# Patient Record
Sex: Female | Born: 1965
Health system: Southern US, Community
[De-identification: ages and names within clinical notes are randomized; demographics above are authoritative.]

## PROBLEM LIST (undated history)

## (undated) ENCOUNTER — Emergency Department (HOSPITAL_COMMUNITY): Admission: EM | Discharge status: Absent without leave | Payer: Self-pay

## (undated) DIAGNOSIS — M549 Dorsalgia, unspecified: Secondary | ICD-10-CM

## (undated) DIAGNOSIS — K7460 Unspecified cirrhosis of liver: Secondary | ICD-10-CM

## (undated) DIAGNOSIS — D509 Iron deficiency anemia, unspecified: Secondary | ICD-10-CM

## (undated) DIAGNOSIS — R519 Headache, unspecified: Secondary | ICD-10-CM

## (undated) DIAGNOSIS — E538 Deficiency of other specified B group vitamins: Secondary | ICD-10-CM

## (undated) DIAGNOSIS — F419 Anxiety disorder, unspecified: Secondary | ICD-10-CM

## (undated) DIAGNOSIS — F909 Attention-deficit hyperactivity disorder, unspecified type: Secondary | ICD-10-CM

## (undated) DIAGNOSIS — R51 Headache: Secondary | ICD-10-CM

## (undated) DIAGNOSIS — I2510 Atherosclerotic heart disease of native coronary artery without angina pectoris: Secondary | ICD-10-CM

## (undated) DIAGNOSIS — I219 Acute myocardial infarction, unspecified: Secondary | ICD-10-CM

## (undated) DIAGNOSIS — K900 Celiac disease: Secondary | ICD-10-CM

## (undated) HISTORY — PX: KNEE CARTILAGE SURGERY: SHX688

## (undated) HISTORY — DX: Anxiety disorder, unspecified: F41.9

## (undated) HISTORY — DX: Deficiency of other specified B group vitamins: E53.8

## (undated) HISTORY — DX: Celiac disease: K90.0

## (undated) HISTORY — DX: Iron deficiency anemia, unspecified: D50.9

## (undated) HISTORY — DX: Attention-deficit hyperactivity disorder, unspecified type: F90.9

## (undated) HISTORY — PX: CHOLECYSTECTOMY: SHX55

## (undated) HISTORY — PX: TONSILLECTOMY: SUR1361

---

## 2005-06-26 ENCOUNTER — Encounter: Admission: RE | Admit: 2005-06-26 | Discharge: 2005-06-26 | Payer: Self-pay | Admitting: Oncology

## 2005-06-26 ENCOUNTER — Encounter (HOSPITAL_COMMUNITY): Admission: RE | Admit: 2005-06-26 | Discharge: 2005-07-26 | Payer: Self-pay | Admitting: Oncology

## 2005-06-26 ENCOUNTER — Ambulatory Visit (HOSPITAL_COMMUNITY): Payer: Self-pay | Admitting: Oncology

## 2005-07-31 ENCOUNTER — Encounter (HOSPITAL_COMMUNITY): Admission: RE | Admit: 2005-07-31 | Discharge: 2005-08-30 | Payer: Self-pay | Admitting: Oncology

## 2005-07-31 ENCOUNTER — Encounter: Admission: RE | Admit: 2005-07-31 | Discharge: 2005-07-31 | Payer: Self-pay | Admitting: Oncology

## 2005-08-28 ENCOUNTER — Ambulatory Visit (HOSPITAL_COMMUNITY): Payer: Self-pay | Admitting: Oncology

## 2006-01-22 ENCOUNTER — Ambulatory Visit (HOSPITAL_COMMUNITY): Payer: Self-pay | Admitting: Oncology

## 2006-01-22 ENCOUNTER — Encounter (HOSPITAL_COMMUNITY): Admission: RE | Admit: 2006-01-22 | Discharge: 2006-02-21 | Payer: Self-pay | Admitting: Oncology

## 2006-06-21 ENCOUNTER — Emergency Department (HOSPITAL_COMMUNITY): Admission: EM | Admit: 2006-06-21 | Discharge: 2006-06-21 | Payer: Self-pay | Admitting: Emergency Medicine

## 2006-10-20 ENCOUNTER — Ambulatory Visit (HOSPITAL_COMMUNITY): Payer: Self-pay | Admitting: Oncology

## 2006-10-20 ENCOUNTER — Encounter (HOSPITAL_COMMUNITY): Admission: RE | Admit: 2006-10-20 | Discharge: 2006-11-19 | Payer: Self-pay | Admitting: Oncology

## 2007-02-02 ENCOUNTER — Emergency Department (HOSPITAL_COMMUNITY): Admission: EM | Admit: 2007-02-02 | Discharge: 2007-02-02 | Payer: Self-pay | Admitting: Emergency Medicine

## 2007-04-24 ENCOUNTER — Encounter (HOSPITAL_COMMUNITY): Admission: RE | Admit: 2007-04-24 | Discharge: 2007-05-24 | Payer: Self-pay | Admitting: Oncology

## 2007-04-24 ENCOUNTER — Ambulatory Visit (HOSPITAL_COMMUNITY): Payer: Self-pay | Admitting: Oncology

## 2008-04-26 ENCOUNTER — Encounter (HOSPITAL_COMMUNITY): Admission: RE | Admit: 2008-04-26 | Discharge: 2008-05-26 | Payer: Self-pay | Admitting: Oncology

## 2008-04-26 ENCOUNTER — Ambulatory Visit (HOSPITAL_COMMUNITY): Payer: Self-pay | Admitting: Oncology

## 2008-07-11 ENCOUNTER — Other Ambulatory Visit: Admission: RE | Admit: 2008-07-11 | Discharge: 2008-07-11 | Payer: Self-pay | Admitting: Obstetrics & Gynecology

## 2008-07-20 ENCOUNTER — Ambulatory Visit (HOSPITAL_COMMUNITY): Payer: Self-pay | Admitting: Oncology

## 2008-07-20 ENCOUNTER — Encounter (HOSPITAL_COMMUNITY): Admission: RE | Admit: 2008-07-20 | Discharge: 2008-08-19 | Payer: Self-pay | Admitting: Oncology

## 2009-04-25 ENCOUNTER — Encounter (HOSPITAL_COMMUNITY): Admission: RE | Admit: 2009-04-25 | Discharge: 2009-05-25 | Payer: Self-pay | Admitting: Oncology

## 2009-04-25 ENCOUNTER — Ambulatory Visit (HOSPITAL_COMMUNITY): Payer: Self-pay | Admitting: Oncology

## 2009-10-27 ENCOUNTER — Encounter (HOSPITAL_COMMUNITY): Admission: RE | Admit: 2009-10-27 | Discharge: 2009-11-26 | Payer: Self-pay | Admitting: Oncology

## 2009-10-27 ENCOUNTER — Ambulatory Visit (HOSPITAL_COMMUNITY): Payer: Self-pay | Admitting: Oncology

## 2010-02-16 ENCOUNTER — Encounter (HOSPITAL_COMMUNITY)
Admission: RE | Admit: 2010-02-16 | Discharge: 2010-03-18 | Payer: Self-pay | Source: Home / Self Care | Attending: Oncology | Admitting: Oncology

## 2010-02-16 ENCOUNTER — Ambulatory Visit (HOSPITAL_COMMUNITY): Payer: Self-pay | Admitting: Oncology

## 2010-03-19 ENCOUNTER — Ambulatory Visit
Admission: RE | Admit: 2010-03-19 | Discharge: 2010-03-19 | Payer: Self-pay | Source: Home / Self Care | Attending: Internal Medicine | Admitting: Internal Medicine

## 2010-03-25 ENCOUNTER — Encounter (HOSPITAL_COMMUNITY): Payer: Self-pay | Admitting: Oncology

## 2010-03-29 ENCOUNTER — Ambulatory Visit: Admit: 2010-03-29 | Payer: Self-pay | Admitting: Internal Medicine

## 2010-03-29 ENCOUNTER — Ambulatory Visit (HOSPITAL_COMMUNITY)
Admission: RE | Admit: 2010-03-29 | Discharge: 2010-03-29 | Payer: Self-pay | Source: Home / Self Care | Attending: Internal Medicine | Admitting: Internal Medicine

## 2010-04-07 NOTE — Consult Note (Signed)
NAME:  Sharon Lawrence, Sharon Lawrence                    ACCOUNT NO.:  [id number]  MEDICAL RECORD NO.:  [id number]          PATIENT TYPE:  AMB  LOCATION:  DAY                           FACILITY:  [facility]  PHYSICIAN:  Hildred Laser, M.D.    DATE OF BIRTH:  03-09-1965  DATE OF CONSULTATION:  03/19/2010 DATE OF DISCHARGE:                                CONSULTATION   PRESENTING COMPLAINT:  Epigastric and left upper quadrant pain, bloating, intermittent nausea, vomiting and diarrhea.  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 Rory Percy for GI evaluation. She has history of celiac disease which was diagnosed back in September 2008, when she presented with iron deficiency anemia and diarrhea.  She had an EGD revealing small sliding hiatal hernia and abnormal duodenal mucosa and biopsy confirming celiac disease.  Her serology was also positive.  She had 10-mm polyp snared from her cecum which was acutely inflamed hyperplastic polyp and another polyp from the cecum turned out to be benign epithelium.  The patient was begun on gluten-free diet and she has done well.  She was diagnosed with B12 deficiency by Dr. Tressie Stalker and has been on replacement therapy.  Recent lab studies are reviewed below.  She states she had been doing well until 3 weeks ago when she started experiencing intermittent epigastric pain, bloating, nausea and sporadic vomiting.  Pain is described to be sharp and not crampy.  She states it is very hard for her to vomit, but when she does is generally fluid. She also complains of frequent diarrhea.  She may have several bowel movements on days when she has diarrhea and another days she passes formed stool.  At times, she feels as if she is constipated, but her stools have never been hard.  She denies melena or rectal bleeding.  She states her abdominal pain is not relieved with a bowel movement.  Her appetite is not good,  but she states she has gained 60 pounds in the last 6 months.  She rarely experiences heartburn.  She denies fever, chills or night sweats.  She states she is very diligent about her gluten-free diet.  She had unintentional exposure few months ago which she followed after she developed her typical symptoms.  She takes ibuprofen 4-800 mg p.r.n. only during her periods of if she has headache.  This is not very often maybe 5-6 doses a month.  She does not do regular exercise or walking.  CURRENT MEDICATIONS: 1. Vitamin B12 1 mg IM every month. 2. Calcium with D p.r.n. 3..  Ibuprofen 4-800 mg daily p.r.n.  PAST MEDICAL HISTORY:  She had tonsillectomy at age 65.  She had cholecystectomy for chronic cholecystitis in June 2006, Dr. Lindalou Hose. She had right knee arthroscopy for torn cartilage in 1985.  Celiac disease was diagnosed when she presented with iron deficiency anemia in September 2008.  She has had mild thrombocytosis felt to be due to her anemia.  Her last EGD and a colonoscopy, where in September 2008, as above.  ALLERGIES:  To  PENICILLIN, MYCIN, SULFA, TRAMADOL and DEMEROL all causing skin rash or throat swelling.  FAMILY HISTORY:  Father has fatty liver and history of colonic adenomas. Mother has history of breast carcinoma which she was treated at age 50 and remains in remission out 49.  Her BRCA-1 and BRCA-2 were negative. She also has history of colonic polyps.  She had 2 adenomas removed possibly a 7 years ago and she had a colonoscopy earlier this month with removal of a dozen adenomas and she still has some left.  Her genetic testing for FAP and [facility], The mutation is pending.  Sharon Lawrence has a sister age 64 who was diagnosed with rectal adenocarcinoma last year and she had chemoradiation followed by surgery and remains in remission.  SOCIAL HISTORY:  She is widowed.  Her second husband died of metastatic Zollinger-Ellison syndrome.  First marriage ended in divorce.  She has  3 children in good health.  She is on iron presently working at [employer].  She smoked cigarettes for 19 years, but quit 1 year ago and she does not drink alcohol.  OBJECTIVE:  VITAL SIGNS:  Weight 200.2 pounds, she is 60 inches tall, pulse 68 per minute, blood pressure 118/70, temp is 97.6. EYES:  Conjunctivae are pink.  Sclerae are Nonicteric. MOUTH:  Oropharyngeal mucosa is normal. NECK:  No neck masses or thyromegaly noted. CARDIAC:  With regular rhythm.  Normal S1 and S2.  No murmur or gallop noted. LUNGS:  Clear to auscultation. ABDOMEN:  Full.  Bowel sounds are normal on palpation, soft abdomen with mild tenderness in epigastric region, left upper quadrant as well as left lower quadrant.  No organomegaly or masses. EXTREMITIES:  She has 1+ pitting edema around her ankles.  No clubbing or koilonychia noted.  LABORATORY DATA:  From February 16, 2010, WBC 12.8, H and H 14 and 40.5, platelet count 490 K.  Her folate was greater than 20.  Serum ferritin was 152 and B12 level was 456.  ASSESSMENT:  Sharon Lawrence is a 45 year old Caucasian female with history of celiac disease who appears to be very compliant with gluten-free diet who presents with few week history of epigastric and left-sided abdominal pain as well as nausea, vomiting and bloating.  She is also having intermittent diarrhea.  Her family history is significant for rectal adenocarcinoma in her younger daughter who was diagnosed last year and remains in remission and her mother has been treated for breast carcinoma and recently had 12 adenomas and she still has some more to be removed.  Her genetic markers are pending.  Sharon Lawrence has 2 GI issues. 1. Acute symptoms with abdominal pain, intermittent nausea, vomiting     and diarrhea.  This maybe due to relapse of her celiac disease.     She maybe exposed to gluten unknowingly.  We also need to rule out     peptic ulcer disease. 2. Family history of colorectal carcinoma and multiple  colonic     adenomas.  Her last colonoscopy was over 3 years ago and she needs      to undergo one now. Depending on results of her mother's genetic test,     she may or may not be further studies.  RECOMMENDATIONS: 1. Dicyclomine 10 mg before each meal prescription given for 90 with 2     refills. 2. Diagnostic esophagogastroduodenoscopy and high-risk screening     colonoscopy.  I have reviewed both the procedure risks with the     patient and she is agreeable. 3.  We appreciate the opportunity to participate in the care of this     nice lady.     Hildred Laser, M.D.     NR/MEDQ  D:  03/20/2010  T:  03/20/2010  Job:  [id number]  cc:   Rory Percy Fax: [phone]  Gaston Islam. Tressie Stalker, MD Fax: [phone]  Electronically Signed by Hildred Laser M.D. on 04/07/2010 01:56:29 PM

## 2010-04-07 NOTE — Op Note (Signed)
NAME:  Sharon Lawrence, Sharon Lawrence                    ACCOUNT NO.:  [id number]  MEDICAL RECORD NO.:  [id number]          PATIENT TYPE:  AMB  LOCATION:  DAY                           FACILITY:  [facility]  PHYSICIAN:  Hildred Laser, M.D.    DATE OF BIRTH:  1965-06-24  DATE OF PROCEDURE:  03/29/2010 DATE OF DISCHARGE:                              OPERATIVE REPORT   PROCEDURE:  Esophagogastroduodenoscopy followed by colonoscopy.  INDICATION:  Sharon Lawrence is a 45 year old Caucasian female who was diagnosed with celiac disease over 3 years ago.  She states she has been very usual and about gluten-free diet, although she has had some unintentional exposure.  She has intermittent nausea, vomiting, epigastric pain and she also has left-sided abdominal pain and diarrhea. Family history is very significant the fact that her younger sister was diagnosed with rectal CA last year and mother just her colonoscopy few weeks ago with removal of over a dozen adenomas and  genetic tests are pending.  Sharon Lawrence's last colonoscopy was over 3 years ago.  Procedure risks were reviewed with the patient.  Informed consent was obtained.  MEDICATIONS FOR CONSCIOUS SEDATION:  Cetacaine spray for pharyngeal topical anesthesia, fentanyl 50 mcg IV and Versed 10 mg IV in divided dose.  FINDINGS:  Procedure performed in endoscopy suite.  The patient's vital signs and O2 sat were monitored during the procedure and remained stable.  PROCEDURE:  Esophagogastroduodenoscopy.  The patient was placed in left lateral recumbent position and Pentax videoscope was passed through oropharynx without any difficulty into esophagus.  Esophagus mucosa of the esophagus normal.  GE junction was 37 cm from the incisors and hiatus was at 37.  Stomach, it was emptied and distended very well with insufflation. Folds proximal stomach are normal.  Examination of mucosa at body, antrum, pyloric channel as well as angularis, fundus, and cardia was normal.  Duodenum,  there was some nodularity to bulbar mucosa.  Scope was passed in to second part of the duodenum where there was minimal scalloping but no erosions or ulcers noted.  Multiple biopsies were taken from the postbulbar mucosa.  Endoscope was withdrawn.  The patient prepared for procedure #2.  Colonoscopy:  Rectal examination was performed.  No abnormality noted on external or digital exam.  Pentax videoscope was placed through rectum and advanced under vision into sigmoid colon and beyond.  Preparation was satisfactory except she had thick coating of mucosa of the ascending colon and cecum.  Somewhat redundant colon, the scope was passed into cecum which was identified by ileocecal valve.  Blunt end of the cecum was easily reached, however, could not see mucosa a very well despite washing it.  As the scope was withdrawn, colonic mucosa was carefully examined.  There was a small polyp at distal transverse colon which was ablated via cold biopsy another one from rectosigmoid junction.  Rest of the mucosa was normal.  Rectal mucosa similarly was normal.  Scope was retroflexed to examine anorectal junction and small hemorrhoids noted below the dentate line and thickening of anoderm.  Endoscope was then withdrawn.  Withdrawal time was 9 minutes.  The patient tolerated the procedures well.  FINAL DIAGNOSES: 1. Abnormal appearance to postbulbar duodenal mucosa, possibly     indicative of ongoing activity secondary to celiac disease. 2. Colonoscopy performed to cecum with evaluation of right colon     limited because of quality of prep, prep distal to hepatic flexure     was satisfactory. 3. Two small polyps ablated via cold biopsy one from transverse colon     other one from rectosigmoid. 4. Small external hemorrhoids.  RECOMMENDATIONS: 1. The patient will continue her gluten-free diet with PPI and     dicyclomine. 2. I will be contacting the patient with results of biopsy and further      recommendations.     Hildred Laser, M.D.     NR/MEDQ  D:  03/29/2010  T:  03/29/2010  Job:  [id number]  cc:   Rory Percy Fax: [phone]  Gaston Islam. Tressie Stalker, MD Fax: [phone]  Electronically Signed by Hildred Laser M.D. on 04/07/2010 01:56:47 PM

## 2010-04-24 ENCOUNTER — Ambulatory Visit (HOSPITAL_COMMUNITY): Payer: Self-pay | Admitting: Oncology

## 2010-05-14 LAB — CBC
HCT: 40.5 % (ref 36.0–46.0)
Hemoglobin: 14 g/dL (ref 12.0–15.0)
MCH: 28.3 pg (ref 26.0–34.0)
MCHC: 34.6 g/dL (ref 30.0–36.0)
MCV: 81.8 fL (ref 78.0–100.0)
Platelets: 490 10*3/uL — ABNORMAL HIGH (ref 150–400)
RBC: 4.95 MIL/uL (ref 3.87–5.11)
RDW: 15.8 % — ABNORMAL HIGH (ref 11.5–15.5)
WBC: 12.8 10*3/uL — ABNORMAL HIGH (ref 4.0–10.5)

## 2010-05-14 LAB — FOLATE: Folate: >20 ng/mL

## 2010-05-14 LAB — FERRITIN: Ferritin: 152 ng/mL (ref 10–291)

## 2010-05-14 LAB — VITAMIN B12: Vitamin B-12: 456 pg/mL (ref 211–911)

## 2010-05-17 LAB — LACTATE DEHYDROGENASE: LDH: 147 U/L (ref 94–250)

## 2010-05-17 LAB — INTRINSIC FACTOR ANTIBODIES: Intrinsic Factor: NEGATIVE

## 2010-05-17 LAB — ANTI-PARIETAL ANTIBODY: Parietal Cell Antibody-IgG: <=20 U

## 2010-05-17 LAB — METHYLMALONIC ACID, SERUM: Methylmalonic Acid, Quantitative: 163 nmol/L (ref 87–318)

## 2010-05-18 LAB — COMPREHENSIVE METABOLIC PANEL
ALT: 34 U/L (ref 0–35)
AST: 30 U/L (ref 0–37)
Albumin: 3.5 g/dL (ref 3.5–5.2)
Alkaline Phosphatase: 75 U/L (ref 39–117)
BUN: 10 mg/dL (ref 6–23)
CO2: 27 mEq/L (ref 19–32)
Calcium: 8.9 mg/dL (ref 8.4–10.5)
Chloride: 107 mEq/L (ref 96–112)
Creatinine, Ser: 0.67 mg/dL (ref 0.4–1.2)
GFR calc Af Amer: >60 mL/min (ref >60)
GFR calc non Af Amer: >60 mL/min (ref >60)
Glucose, Bld: 87 mg/dL (ref 70–99)
Potassium: 4.5 mEq/L (ref 3.5–5.1)
Sodium: 139 mEq/L (ref 135–145)
Total Bilirubin: 0.3 mg/dL (ref 0.3–1.2)
Total Protein: 6.2 g/dL (ref 6.0–8.3)

## 2010-05-18 LAB — CBC
HCT: 40.8 % (ref 36.0–46.0)
Hemoglobin: 13.5 g/dL (ref 12.0–15.0)
MCH: 27.7 pg (ref 26.0–34.0)
MCHC: 33.1 g/dL (ref 30.0–36.0)
MCV: 83.7 fL (ref 78.0–100.0)
Platelets: 555 10*3/uL — ABNORMAL HIGH (ref 150–400)
RBC: 4.88 MIL/uL (ref 3.87–5.11)
RDW: 14.6 % (ref 11.5–15.5)
WBC: 11.2 10*3/uL — ABNORMAL HIGH (ref 4.0–10.5)

## 2010-05-18 LAB — IRON AND TIBC
Iron: 104 ug/dL (ref 42–135)
Saturation Ratios: 27 % (ref 20–55)
TIBC: 380 ug/dL (ref 250–470)
UIBC: 276 ug/dL

## 2010-05-18 LAB — VITAMIN B12: Vitamin B-12: 187 pg/mL — ABNORMAL LOW (ref 211–911)

## 2010-05-18 LAB — RETICULOCYTES
RBC.: 4.88 MIL/uL (ref 3.87–5.11)
Retic Count, Absolute: 63.4 10*3/uL (ref 19.0–186.0)
Retic Ct Pct: 1.3 % (ref 0.4–3.1)

## 2010-05-18 LAB — FERRITIN: Ferritin: 16 ng/mL (ref 10–291)

## 2010-05-18 LAB — FOLATE: Folate: 3.8 ng/mL

## 2010-05-23 LAB — VITAMIN B12: Vitamin B-12: 422 pg/mL (ref 211–911)

## 2010-05-23 LAB — DIFFERENTIAL
Basophils Absolute: 0.1 10*3/uL (ref 0.0–0.1)
Basophils Relative: 1 % (ref 0–1)
Eosinophils Absolute: 0.3 10*3/uL (ref 0.0–0.7)
Eosinophils Relative: 3 % (ref 0–5)
Lymphocytes Relative: 35 % (ref 12–46)
Lymphs Abs: 3.6 10*3/uL (ref 0.7–4.0)
Monocytes Absolute: 0.6 10*3/uL (ref 0.1–1.0)
Monocytes Relative: 6 % (ref 3–12)
Neutro Abs: 5.7 10*3/uL (ref 1.7–7.7)
Neutrophils Relative %: 55 % (ref 43–77)

## 2010-05-23 LAB — CBC
HCT: 40.9 % (ref 36.0–46.0)
Hemoglobin: 13.6 g/dL (ref 12.0–15.0)
MCHC: 33.3 g/dL (ref 30.0–36.0)
MCV: 84.7 fL (ref 78.0–100.0)
Platelets: 533 10*3/uL — ABNORMAL HIGH (ref 150–400)
RBC: 4.83 MIL/uL (ref 3.87–5.11)
RDW: 14.8 % (ref 11.5–15.5)
WBC: 10.3 10*3/uL (ref 4.0–10.5)

## 2010-05-23 LAB — VITAMIN D 1,25 DIHYDROXY
Vitamin D 1, 25 (OH)2 Total: 47 pg/mL (ref 18–72)
Vitamin D2 1, 25 (OH)2: <8 pg/mL
Vitamin D3 1, 25 (OH)2: 47 pg/mL

## 2010-05-23 LAB — FOLATE: Folate: 4.1 ng/mL

## 2010-05-23 LAB — FERRITIN: Ferritin: 37 ng/mL (ref 10–291)

## 2010-06-12 LAB — CBC
HCT: 41.9 % (ref 36.0–46.0)
Hemoglobin: 14.6 g/dL (ref 12.0–15.0)
MCHC: 35 g/dL (ref 30.0–36.0)
MCV: 84.7 fL (ref 78.0–100.0)
Platelets: 409 10*3/uL — ABNORMAL HIGH (ref 150–400)
RBC: 4.95 MIL/uL (ref 3.87–5.11)
RDW: 14.1 % (ref 11.5–15.5)
WBC: 13.9 10*3/uL — ABNORMAL HIGH (ref 4.0–10.5)

## 2010-06-12 LAB — FERRITIN: Ferritin: 43 ng/mL (ref 10–291)

## 2010-06-12 LAB — VITAMIN D 25 HYDROXY (VIT D DEFICIENCY, FRACTURES): Vit D, 25-Hydroxy: 37 ng/mL (ref 30–89)

## 2010-06-12 LAB — FOLATE: Folate: 11.1 ng/mL

## 2010-06-19 LAB — CBC
HCT: 44.5 % (ref 36.0–46.0)
Hemoglobin: 14.8 g/dL (ref 12.0–15.0)
MCHC: 33.1 g/dL (ref 30.0–36.0)
MCV: 85.5 fL (ref 78.0–100.0)
Platelets: 501 10*3/uL — ABNORMAL HIGH (ref 150–400)
RBC: 5.21 MIL/uL — ABNORMAL HIGH (ref 3.87–5.11)
RDW: 15.6 % — ABNORMAL HIGH (ref 11.5–15.5)
WBC: 10.7 10*3/uL — ABNORMAL HIGH (ref 4.0–10.5)

## 2010-06-19 LAB — FOLATE: Folate: 4.9 ng/mL

## 2010-06-19 LAB — FERRITIN: Ferritin: 37 ng/mL (ref 10–291)

## 2010-06-19 LAB — VITAMIN D 25 HYDROXY (VIT D DEFICIENCY, FRACTURES): Vit D, 25-Hydroxy: 18 ng/mL — ABNORMAL LOW (ref 30–89)

## 2010-09-03 ENCOUNTER — Ambulatory Visit (INDEPENDENT_AMBULATORY_CARE_PROVIDER_SITE_OTHER): Payer: 59 | Admitting: Internal Medicine

## 2010-11-23 LAB — FERRITIN: Ferritin: 58 (ref 10–291)

## 2010-11-23 LAB — CBC
HCT: 41
Hemoglobin: 13.8
MCHC: 33.6
MCV: 82.9
Platelets: 430 — ABNORMAL HIGH
RBC: 4.94
RDW: 15.9 — ABNORMAL HIGH
WBC: 11.1 — ABNORMAL HIGH

## 2010-11-23 LAB — FOLATE: Folate: >20

## 2010-11-23 LAB — GLIADIN ANTIBODIES, SERUM
Gliadin IgA: 36 U/mL — ABNORMAL HIGH (ref <7)
Gliadin IgG: 53 U/mL — ABNORMAL HIGH (ref <7)

## 2010-11-23 LAB — RETICULIN AB, IGA: Reticulin Antibody, IgA: POSITIVE

## 2010-11-23 LAB — TISSUE TRANSGLUTAMINASE, IGA: Tissue Transglutaminase Ab, IgA: >128 U/mL — ABNORMAL HIGH (ref <7)

## 2010-12-10 LAB — CBC
HCT: 50.2 — ABNORMAL HIGH
Hemoglobin: 16.6 — ABNORMAL HIGH
MCHC: 33
MCV: 79.7
Platelets: 582 — ABNORMAL HIGH
RBC: 6.3 — ABNORMAL HIGH
RDW: 18 — ABNORMAL HIGH
WBC: 12.8 — ABNORMAL HIGH

## 2010-12-10 LAB — DIFFERENTIAL
Basophils Absolute: 0
Basophils Relative: 0
Eosinophils Absolute: 0 — ABNORMAL LOW
Eosinophils Relative: 0
Lymphocytes Relative: 16
Lymphs Abs: 2
Monocytes Absolute: 0.3
Monocytes Relative: 2 — ABNORMAL LOW
Neutro Abs: 10.5 — ABNORMAL HIGH
Neutrophils Relative %: 82 — ABNORMAL HIGH

## 2010-12-10 LAB — COMPREHENSIVE METABOLIC PANEL
ALT: 39 — ABNORMAL HIGH
AST: 40 — ABNORMAL HIGH
Albumin: 4.4
Alkaline Phosphatase: 95
BUN: 27 — ABNORMAL HIGH
CO2: 17 — ABNORMAL LOW
Calcium: 8.6
Chloride: 103
Creatinine, Ser: 1.56 — ABNORMAL HIGH
GFR calc Af Amer: 45 — ABNORMAL LOW
GFR calc non Af Amer: 37 — ABNORMAL LOW
Glucose, Bld: 158 — ABNORMAL HIGH
Potassium: 4
Sodium: 135
Total Bilirubin: 0.5
Total Protein: 8.6 — ABNORMAL HIGH

## 2010-12-14 LAB — CBC
HCT: 37.4
Hemoglobin: 12.1
MCHC: 32.5
MCV: 75.7 — ABNORMAL LOW
Platelets: 514 — ABNORMAL HIGH
RBC: 4.94
RDW: 21.6 — ABNORMAL HIGH
WBC: 6.6

## 2010-12-14 LAB — FOLATE RBC: RBC Folate: 241

## 2010-12-14 LAB — VITAMIN B12: Vitamin B-12: 399 (ref 211–911)

## 2010-12-20 ENCOUNTER — Encounter (INDEPENDENT_AMBULATORY_CARE_PROVIDER_SITE_OTHER): Payer: Self-pay | Admitting: *Deleted

## 2011-02-07 ENCOUNTER — Encounter (HOSPITAL_COMMUNITY): Payer: Self-pay | Admitting: Oncology

## 2011-02-07 ENCOUNTER — Other Ambulatory Visit (HOSPITAL_COMMUNITY): Payer: Self-pay | Admitting: Oncology

## 2011-02-07 DIAGNOSIS — E611 Iron deficiency: Secondary | ICD-10-CM | POA: Insufficient documentation

## 2011-02-07 DIAGNOSIS — D509 Iron deficiency anemia, unspecified: Secondary | ICD-10-CM

## 2011-02-07 DIAGNOSIS — E538 Deficiency of other specified B group vitamins: Secondary | ICD-10-CM | POA: Insufficient documentation

## 2011-02-07 HISTORY — DX: Deficiency of other specified B group vitamins: E53.8

## 2011-02-07 HISTORY — DX: Iron deficiency anemia, unspecified: D50.9

## 2011-02-11 ENCOUNTER — Other Ambulatory Visit (HOSPITAL_COMMUNITY): Payer: 59

## 2011-02-14 ENCOUNTER — Other Ambulatory Visit (HOSPITAL_COMMUNITY): Payer: Self-pay | Admitting: Oncology

## 2011-02-14 ENCOUNTER — Encounter (HOSPITAL_COMMUNITY): Payer: 59 | Attending: Oncology

## 2011-02-14 DIAGNOSIS — E538 Deficiency of other specified B group vitamins: Secondary | ICD-10-CM | POA: Insufficient documentation

## 2011-02-14 LAB — COMPREHENSIVE METABOLIC PANEL
ALT: 25 U/L (ref 0–35)
AST: 20 U/L (ref 0–37)
Albumin: 3.4 g/dL — ABNORMAL LOW (ref 3.5–5.2)
Alkaline Phosphatase: 89 U/L (ref 39–117)
BUN: 9 mg/dL (ref 6–23)
CO2: 26 mEq/L (ref 19–32)
Calcium: 9.3 mg/dL (ref 8.4–10.5)
Chloride: 104 mEq/L (ref 96–112)
Creatinine, Ser: 0.65 mg/dL (ref 0.50–1.10)
GFR calc Af Amer: >90 mL/min (ref >90)
GFR calc non Af Amer: >90 mL/min (ref >90)
Glucose, Bld: 84 mg/dL (ref 70–99)
Potassium: 4.1 mEq/L (ref 3.5–5.1)
Sodium: 138 mEq/L (ref 135–145)
Total Bilirubin: 0.2 mg/dL — ABNORMAL LOW (ref 0.3–1.2)
Total Protein: 6.9 g/dL (ref 6.0–8.3)

## 2011-02-14 LAB — DIFFERENTIAL
Basophils Absolute: 0.1 10*3/uL (ref 0.0–0.1)
Basophils Relative: 1 % (ref 0–1)
Eosinophils Absolute: 0.1 10*3/uL (ref 0.0–0.7)
Eosinophils Relative: 1 % (ref 0–5)
Lymphocytes Relative: 31 % (ref 12–46)
Lymphs Abs: 3.4 10*3/uL (ref 0.7–4.0)
Monocytes Absolute: 0.8 10*3/uL (ref 0.1–1.0)
Monocytes Relative: 7 % (ref 3–12)
Neutro Abs: 6.7 10*3/uL (ref 1.7–7.7)
Neutrophils Relative %: 61 % (ref 43–77)

## 2011-02-14 LAB — IRON AND TIBC
Iron: 59 ug/dL (ref 42–135)
Saturation Ratios: 16 % — ABNORMAL LOW (ref 20–55)
TIBC: 375 ug/dL (ref 250–470)
UIBC: 316 ug/dL (ref 125–400)

## 2011-02-14 LAB — CBC
HCT: 42.7 % (ref 36.0–46.0)
Hemoglobin: 13.9 g/dL (ref 12.0–15.0)
MCH: 26.3 pg (ref 26.0–34.0)
MCHC: 32.6 g/dL (ref 30.0–36.0)
MCV: 80.7 fL (ref 78.0–100.0)
Platelets: 592 10*3/uL — ABNORMAL HIGH (ref 150–400)
RBC: 5.29 MIL/uL — ABNORMAL HIGH (ref 3.87–5.11)
RDW: 16.5 % — ABNORMAL HIGH (ref 11.5–15.5)
WBC: 11 10*3/uL — ABNORMAL HIGH (ref 4.0–10.5)

## 2011-02-14 LAB — FOLATE: Folate: 4 ng/mL

## 2011-02-14 LAB — FERRITIN: Ferritin: 21 ng/mL (ref 10–291)

## 2011-02-14 LAB — VITAMIN B12: Vitamin B-12: 318 pg/mL (ref 211–911)

## 2011-02-14 NOTE — Progress Notes (Signed)
Labs drawn today for cmp, vb12, folate,Iron and IBC, Ferr, cbc,diff

## 2011-02-15 ENCOUNTER — Other Ambulatory Visit (HOSPITAL_COMMUNITY): Payer: Self-pay | Admitting: Oncology

## 2011-02-15 ENCOUNTER — Telehealth (HOSPITAL_COMMUNITY): Payer: Self-pay

## 2011-02-15 NOTE — Telephone Encounter (Signed)
Unable to reach by phone.  Message left for patient to call back. 

## 2011-02-18 ENCOUNTER — Encounter (HOSPITAL_BASED_OUTPATIENT_CLINIC_OR_DEPARTMENT_OTHER): Payer: 59

## 2011-02-18 VITALS — BP 106/72 | HR 73 | Temp 98.3°F

## 2011-02-18 DIAGNOSIS — E611 Iron deficiency: Secondary | ICD-10-CM

## 2011-02-18 MED ORDER — SODIUM CHLORIDE 0.9 % IV SOLN
1020.0000 mg | Freq: Once | INTRAVENOUS | Status: AC
  Administered 2011-02-18: 1020 mg via INTRAVENOUS
  Filled 2011-02-18: qty 34

## 2011-02-18 MED ORDER — SODIUM CHLORIDE 0.9 % IJ SOLN
INTRAMUSCULAR | Status: AC
  Filled 2011-02-18: qty 10

## 2011-02-18 MED ORDER — SODIUM CHLORIDE 0.9 % IJ SOLN
10.0000 mL | INTRAMUSCULAR | Status: DC | PRN
  Administered 2011-02-18: 10 mL via INTRAVENOUS
  Filled 2011-02-18: qty 10

## 2011-03-26 ENCOUNTER — Ambulatory Visit (HOSPITAL_COMMUNITY): Payer: 59 | Admitting: Oncology

## 2012-05-21 ENCOUNTER — Telehealth (HOSPITAL_COMMUNITY): Payer: Self-pay | Admitting: *Deleted

## 2012-05-21 NOTE — Telephone Encounter (Signed)
Sharon Lawrence had to change her appt to see you to 4/3 and she wanted to know if you needed labs before you saw her or if she could do them the day of .Marland KitchenMarland KitchenMarland KitchenJust let me know which way and I will let her know if she needs to come in earlier.Marland KitchenMarland Kitchen

## 2012-05-25 ENCOUNTER — Ambulatory Visit (HOSPITAL_COMMUNITY): Payer: 59 | Admitting: Oncology

## 2012-06-01 ENCOUNTER — Other Ambulatory Visit (HOSPITAL_COMMUNITY): Payer: 59

## 2012-06-03 ENCOUNTER — Encounter (HOSPITAL_COMMUNITY): Payer: BC Managed Care – PPO | Attending: Oncology | Admitting: Oncology

## 2012-06-03 ENCOUNTER — Encounter (HOSPITAL_COMMUNITY): Payer: Self-pay | Admitting: Oncology

## 2012-06-03 VITALS — BP 137/77 | HR 81 | Temp 98.5°F | Resp 16 | Ht 60.0 in | Wt 202.4 lb

## 2012-06-03 DIAGNOSIS — K589 Irritable bowel syndrome without diarrhea: Secondary | ICD-10-CM | POA: Insufficient documentation

## 2012-06-03 DIAGNOSIS — N926 Irregular menstruation, unspecified: Secondary | ICD-10-CM

## 2012-06-03 DIAGNOSIS — E538 Deficiency of other specified B group vitamins: Secondary | ICD-10-CM | POA: Insufficient documentation

## 2012-06-03 DIAGNOSIS — D539 Nutritional anemia, unspecified: Secondary | ICD-10-CM

## 2012-06-03 DIAGNOSIS — R161 Splenomegaly, not elsewhere classified: Secondary | ICD-10-CM

## 2012-06-03 DIAGNOSIS — K900 Celiac disease: Secondary | ICD-10-CM | POA: Insufficient documentation

## 2012-06-03 DIAGNOSIS — E611 Iron deficiency: Secondary | ICD-10-CM

## 2012-06-03 DIAGNOSIS — D509 Iron deficiency anemia, unspecified: Secondary | ICD-10-CM | POA: Insufficient documentation

## 2012-06-03 DIAGNOSIS — R635 Abnormal weight gain: Secondary | ICD-10-CM

## 2012-06-03 DIAGNOSIS — K7689 Other specified diseases of liver: Secondary | ICD-10-CM | POA: Insufficient documentation

## 2012-06-03 DIAGNOSIS — R160 Hepatomegaly, not elsewhere classified: Secondary | ICD-10-CM

## 2012-06-03 LAB — COMPREHENSIVE METABOLIC PANEL
ALT: 28 U/L (ref 0–35)
AST: 24 U/L (ref 0–37)
Albumin: 3.5 g/dL (ref 3.5–5.2)
Alkaline Phosphatase: 96 U/L (ref 39–117)
BUN: 10 mg/dL (ref 6–23)
CO2: 25 mEq/L (ref 19–32)
Calcium: 9.3 mg/dL (ref 8.4–10.5)
Chloride: 105 mEq/L (ref 96–112)
Creatinine, Ser: 0.65 mg/dL (ref 0.50–1.10)
GFR calc Af Amer: >90 mL/min (ref >90)
GFR calc non Af Amer: >90 mL/min (ref >90)
Glucose, Bld: 111 mg/dL — ABNORMAL HIGH (ref 70–99)
Potassium: 4.4 mEq/L (ref 3.5–5.1)
Sodium: 138 mEq/L (ref 135–145)
Total Bilirubin: 0.2 mg/dL — ABNORMAL LOW (ref 0.3–1.2)
Total Protein: 6.7 g/dL (ref 6.0–8.3)

## 2012-06-03 LAB — CBC WITH DIFFERENTIAL/PLATELET
Basophils Absolute: 0 10*3/uL (ref 0.0–0.1)
Basophils Relative: 0 % (ref 0–1)
Eosinophils Absolute: 0 10*3/uL (ref 0.0–0.7)
Eosinophils Relative: 0 % (ref 0–5)
HCT: 42.3 % (ref 36.0–46.0)
Hemoglobin: 14.1 g/dL (ref 12.0–15.0)
Lymphocytes Relative: 40 % (ref 12–46)
Lymphs Abs: 3.6 10*3/uL (ref 0.7–4.0)
MCH: 26.7 pg (ref 26.0–34.0)
MCHC: 33.3 g/dL (ref 30.0–36.0)
MCV: 80.1 fL (ref 78.0–100.0)
Monocytes Absolute: 0.5 10*3/uL (ref 0.1–1.0)
Monocytes Relative: 5 % (ref 3–12)
Neutro Abs: 4.9 10*3/uL (ref 1.7–7.7)
Neutrophils Relative %: 55 % (ref 43–77)
Platelets: 557 10*3/uL — ABNORMAL HIGH (ref 150–400)
RBC: 5.28 MIL/uL — ABNORMAL HIGH (ref 3.87–5.11)
RDW: 16 % — ABNORMAL HIGH (ref 11.5–15.5)
WBC: 9 10*3/uL (ref 4.0–10.5)

## 2012-06-03 LAB — TSH: TSH: 1.745 u[IU]/mL (ref 0.350–4.500)

## 2012-06-03 LAB — IRON AND TIBC
Iron: 77 ug/dL (ref 42–135)
Saturation Ratios: 23 % (ref 20–55)
TIBC: 342 ug/dL (ref 250–470)
UIBC: 265 ug/dL (ref 125–400)

## 2012-06-03 LAB — FERRITIN: Ferritin: 24 ng/mL (ref 10–291)

## 2012-06-03 LAB — FOLATE: Folate: 5.7 ng/mL

## 2012-06-03 NOTE — Patient Instructions (Addendum)
.  [facility] Discharge Instructions  RECOMMENDATIONS MADE BY THE CONSULTANT AND ANY TEST RESULTS WILL BE SENT TO YOUR REFERRING PHYSICIAN.  EXAM FINDINGS BY THE PHYSICIAN TODAY AND SIGNS OR SYMPTOMS TO REPORT TO CLINIC OR PRIMARY PHYSICIAN:  We will do labs today Ct scans because liver and spleen are tender  INSTRUCTIONS GIVEN AND DISCUSSED: See [place] back in 2 weeks   Thank you for choosing [facility] to provide your oncology and hematology care.  To afford each patient quality time with our providers, please arrive at least 15 minutes before your scheduled appointment time.  With your help, our goal is to use those 15 minutes to complete the necessary work-up to ensure our physicians have the information they need to help with your evaluation and healthcare recommendations.    Effective January 1st, 2014, we ask that you re-schedule your appointment with our physicians should you arrive 10 or more minutes late for your appointment.  We strive to give you quality time with our providers, and arriving late affects you and other patients whose appointments are after yours.    Again, thank you for choosing [facility].  Our hope is that these requests will decrease the amount of time that you wait before being seen by our physicians.       _____________________________________________________________  Should you have questions after your visit to [facility], please contact our office at [phone] between the hours of 8:30 a.m. and 5:00 p.m.  Voicemails left after 4:30 p.m. will not be returned until the following business day.  For prescription refill requests, have your pharmacy contact our office with your prescription refill request.

## 2012-06-03 NOTE — Progress Notes (Signed)
Diagnosis #1 history of B12 deficiency #2 history of iron deficiency that was severe #3 history of folic acid deficiency #4 history of celiac disease but she still eats gluten occasionally #5 IBS #6 obesity weighing over 200 pounds on a 5 foot frame #7 hepatosplenomegaly with tenderness both over the spleen and liver #8 history of depression after the death of her husband 12 years ago  Very pleasant young lady 56 still having regular heavy menses. She promises that she takes her folic acid every day at her B12 shots monthly. She was told to take a PPI by Dr. Karilyn Cota approximately 2 years ago after her colonoscopy and EGD as well as dicyclomine but she never got either one. She has not been feeling well. She does have much energy. She doesn't get any regular exercise at all. She is able to work and she comes home and feels exhausted. She still has 3 children basically at home. One is to graduate from high school this year the other is 45 years old and she still has a 45 year old.  She is not actively dating. She doesn't appear depressed doesn't admit to that.  She still has explosive diarrhea times. She states that is why she doesn't like eats salads but she has explosive diarrhea even without eating salads. This is most likely related to the IBS.   BP 137/77  Pulse 81  Temp(Src) 98.5 F (36.9 C) (Oral)  Resp 16  Ht 5' (1.524 m)  Wt 202 lb 6.4 oz (91.808 kg)  BMI 39.53 kg/m2  She is more overweight and I ever remember. She is in no acute distress. She does not have obvious thyromegaly to me. She has no lymphadenopathy in any location. Her lungs are clear but she does tell me she is now smoking 5 cigarettes a day and has done so since her husband died. Heart shows a regular rhythm and rate. Breast exam shows no distinct masses but she is fibroglandular changes especially in the outer quadrants. Abdomen shows a liver edge easily palpable 5-6 centers below the costal margin the right in its  tender and her spleen tip is palpable in the left upper quadrant it is also tender. Spleen tip extends partially 3 cm below the costal margin on the left. Her abdomen is obese. Bowel sounds are normal to hyperactive. She has no peripheral edema of the arms. His puffiness of both legs and there is a sense of trace edema.  She is alert and oriented.  She needs blood work and she is a CAT scan of her abdomen and pelvis and I need to discuss her case with Dr. Karilyn Cota. She promises to come back in 2 weeks

## 2012-06-05 LAB — METHYLMALONIC ACID, SERUM: Methylmalonic Acid, Quantitative: 0.25 umol/L (ref <0.40)

## 2012-06-10 ENCOUNTER — Ambulatory Visit (HOSPITAL_COMMUNITY)
Admission: RE | Admit: 2012-06-10 | Discharge: 2012-06-10 | Disposition: A | Discharge status: Home / Self Care | Payer: BC Managed Care – PPO | Source: Ambulatory Visit | Attending: Oncology | Admitting: Oncology

## 2012-06-10 DIAGNOSIS — E611 Iron deficiency: Secondary | ICD-10-CM

## 2012-06-10 DIAGNOSIS — D539 Nutritional anemia, unspecified: Secondary | ICD-10-CM

## 2012-06-10 DIAGNOSIS — R1011 Right upper quadrant pain: Secondary | ICD-10-CM | POA: Insufficient documentation

## 2012-06-10 DIAGNOSIS — R635 Abnormal weight gain: Secondary | ICD-10-CM | POA: Insufficient documentation

## 2012-06-10 DIAGNOSIS — R160 Hepatomegaly, not elsewhere classified: Secondary | ICD-10-CM | POA: Insufficient documentation

## 2012-06-10 DIAGNOSIS — E538 Deficiency of other specified B group vitamins: Secondary | ICD-10-CM

## 2012-06-10 MED ORDER — IOHEXOL 300 MG/ML  SOLN
100.0000 mL | Freq: Once | INTRAMUSCULAR | Status: AC | PRN
  Administered 2012-06-10: 100 mL via INTRAVENOUS

## 2012-06-17 ENCOUNTER — Encounter (HOSPITAL_BASED_OUTPATIENT_CLINIC_OR_DEPARTMENT_OTHER): Payer: BC Managed Care – PPO | Admitting: Oncology

## 2012-06-17 ENCOUNTER — Encounter (HOSPITAL_COMMUNITY): Payer: Self-pay | Admitting: Oncology

## 2012-06-17 VITALS — BP 122/73 | HR 100 | Temp 98.6°F | Resp 18 | Wt 204.8 lb

## 2012-06-17 DIAGNOSIS — R591 Generalized enlarged lymph nodes: Secondary | ICD-10-CM

## 2012-06-17 DIAGNOSIS — R599 Enlarged lymph nodes, unspecified: Secondary | ICD-10-CM

## 2012-06-17 DIAGNOSIS — D509 Iron deficiency anemia, unspecified: Secondary | ICD-10-CM

## 2012-06-17 DIAGNOSIS — E611 Iron deficiency: Secondary | ICD-10-CM

## 2012-06-17 DIAGNOSIS — K7689 Other specified diseases of liver: Secondary | ICD-10-CM

## 2012-06-17 DIAGNOSIS — E538 Deficiency of other specified B group vitamins: Secondary | ICD-10-CM

## 2012-06-17 MED ORDER — FOLIC ACID 1 MG PO TABS: 1.0000 mg | ORAL_TABLET | Freq: Two times a day (BID) | ORAL | Status: DC

## 2012-06-17 NOTE — Patient Instructions (Addendum)
.  [facility] Discharge Instructions  RECOMMENDATIONS MADE BY THE CONSULTANT AND ANY TEST RESULTS WILL BE SENT TO YOUR REFERRING PHYSICIAN.  EXAM FINDINGS BY THE PHYSICIAN TODAY AND SIGNS OR SYMPTOMS TO REPORT TO CLINIC OR PRIMARY PHYSICIAN: Stick to Gluten free diet  MEDICATIONS PRESCRIBED:  Continue b 12 Folic acid twice a day  INSTRUCTIONS GIVEN AND DISCUSSED: Iron infusion in May Labs and Ct scans in August Then to see Dr. Mariel Sleet and Karilyn Cota after Scans  SPECIAL INSTRUCTIONS/FOLLOW-UP:   Thank you for choosing [facility] to provide your oncology and hematology care.  To afford each patient quality time with our providers, please arrive at least 15 minutes before your scheduled appointment time.  With your help, our goal is to use those 15 minutes to complete the necessary work-up to ensure our physicians have the information they need to help with your evaluation and healthcare recommendations.    Effective January 1st, 2014, we ask that you re-schedule your appointment with our physicians should you arrive 10 or more minutes late for your appointment.  We strive to give you quality time with our providers, and arriving late affects you and other patients whose appointments are after yours.    Again, thank you for choosing [facility].  Our hope is that these requests will decrease the amount of time that you wait before being seen by our physicians.       _____________________________________________________________  Should you have questions after your visit to [facility], please contact our office at [phone] between the hours of 8:30 a.m. and 5:00 p.m.  Voicemails left after 4:30 p.m. will not be returned until the following business day.  For prescription refill requests, have your pharmacy contact our office with your prescription refill request.

## 2012-06-17 NOTE — Progress Notes (Signed)
#  1 B12 deficiency on replacement B12 monthly with a normal methylmalonic acid level #2 iron deficiency, severe, secondary to inadequate absorption from the GI tract secondary to celiac disease. Her iron studies now are borderline and rather than wait until she is severely deficient we will preemptively give her IV iron in May 2014 #3 folic acid deficiency also with a borderline low normal value and I will increase her dose to 1 mg twice a day #4 celiac disease #5 prominent lymph nodes within the abdominal mesentery and Dr. Karilyn Cota and I feel that she is a repeat CAT scan in 4 months which we will schedule in August 2014 #6 IBS #7 fatty infiltration of her liver, by CAT scan criteria she does not have enlargement of her liver or spleen but she has had a liver biopsy the past showing fatty liver  She is here today to go over all the results her laboratory work which we have done. She does need increase her folic acid. She does need a repeat IV iron infusion. We will schedule that for May of this year.  We will then see her back in August after her CAT scans for reevaluation upper lymph nodes I want her to see Dr. Karilyn Cota and make sure she is doing well from the select disease picture. She really is noncompliant with her diet which I think would make all the difference in the were with her weight and her sense of well-being.  With the fatty infiltration of the liver she clearly needs weight reduction to reduce her risk of cirrhosis and eventually hepatocellular carcinoma.  I counseled her today extensively about the need for the above issues.

## 2012-06-24 NOTE — Progress Notes (Signed)
Will schedule office visit.

## 2012-06-24 NOTE — Progress Notes (Signed)
Apt has been scheduled for 07/01/12 with Dorene Ar,  NP.

## 2012-07-02 ENCOUNTER — Ambulatory Visit (INDEPENDENT_AMBULATORY_CARE_PROVIDER_SITE_OTHER): Payer: BC Managed Care – PPO | Admitting: Internal Medicine

## 2012-07-08 ENCOUNTER — Encounter (INDEPENDENT_AMBULATORY_CARE_PROVIDER_SITE_OTHER): Payer: Self-pay | Admitting: *Deleted

## 2012-07-08 ENCOUNTER — Encounter (INDEPENDENT_AMBULATORY_CARE_PROVIDER_SITE_OTHER): Payer: Self-pay | Admitting: Internal Medicine

## 2012-07-08 ENCOUNTER — Encounter (HOSPITAL_COMMUNITY): Payer: BC Managed Care – PPO | Attending: Oncology

## 2012-07-08 ENCOUNTER — Ambulatory Visit (INDEPENDENT_AMBULATORY_CARE_PROVIDER_SITE_OTHER): Payer: BC Managed Care – PPO | Admitting: Internal Medicine

## 2012-07-08 VITALS — BP 105/71 | HR 93 | Temp 97.9°F | Resp 18

## 2012-07-08 VITALS — BP 110/80 | HR 80 | Ht 60.0 in | Wt 199.9 lb

## 2012-07-08 DIAGNOSIS — D509 Iron deficiency anemia, unspecified: Secondary | ICD-10-CM

## 2012-07-08 DIAGNOSIS — K760 Fatty (change of) liver, not elsewhere classified: Secondary | ICD-10-CM | POA: Insufficient documentation

## 2012-07-08 DIAGNOSIS — E611 Iron deficiency: Secondary | ICD-10-CM

## 2012-07-08 DIAGNOSIS — K7689 Other specified diseases of liver: Secondary | ICD-10-CM

## 2012-07-08 MED ORDER — SODIUM CHLORIDE 0.9 % IJ SOLN
10.0000 mL | INTRAMUSCULAR | Status: DC | PRN
  Administered 2012-07-08: 10 mL
  Filled 2012-07-08: qty 10

## 2012-07-08 MED ORDER — SODIUM CHLORIDE 0.9 % IV SOLN
Freq: Once | INTRAVENOUS | Status: AC
  Administered 2012-07-08: 14:00:00 via INTRAVENOUS

## 2012-07-08 MED ORDER — SODIUM CHLORIDE 0.9 % IV SOLN
1020.0000 mg | Freq: Once | INTRAVENOUS | Status: AC
  Administered 2012-07-08: 1020 mg via INTRAVENOUS
  Filled 2012-07-08: qty 34

## 2012-07-08 NOTE — Patient Instructions (Addendum)
cmet in 6 months. OV in 6 month. Continue to diet and exercise

## 2012-07-08 NOTE — Progress Notes (Signed)
Subjective:     Patient ID: Sharon Lawrence, female   DOB: [birth year], 47 y.o.   MRN: [id number]  HPI Here today for f/u of fatty liver. Diagnosed in 2006 on biopsy by Dr. Lindalou Hose when she underwent a cholecystectomy. She is exercising. She walks at least 3 x a week. She walks x 15 minutes when she walks. In 2012 she weighed 200, today she weighs 199.9 Appetite is good. She is on a Celiac diet and admits she does chest at times. No abdominal pain. She does occasionally have left upper quadrant pain with nausea and vomiting. She then had diarrhea. Symptoms lasted about 8 hrs and then resolved.  She sees Dr. Tressie Stalker for iron deficiency and receives Iron transfusions at once a year. She just received an iron transfusion today.  06/03/2012 CT abdomen/pelvis: IMPRESSION:  1. Liver and spleen are normal volume.  2. No abdominal or pelvic lymphadenopathy.  3. Bilateral benign adrenal adenomas.   Liver biopsy during cholecystectomy in 2006: Hepatic steatosis. Predominantly macrovesicular, occupying approximately 20% of the tissue.   06/03/12 ferritin 24, Iron 77.  CMP     Component Value Date/Time   NA 138 06/03/2012 1156   K 4.4 06/03/2012 1156   CL 105 06/03/2012 1156   CO2 25 06/03/2012 1156   GLUCOSE 111* 06/03/2012 1156   BUN 10 06/03/2012 1156   CREATININE 0.65 06/03/2012 1156   CALCIUM 9.3 06/03/2012 1156   PROT 6.7 06/03/2012 1156   ALBUMIN 3.5 06/03/2012 1156   AST 24 06/03/2012 1156   ALT 28 06/03/2012 1156   ALKPHOS 96 06/03/2012 1156   BILITOT 0.2* 06/03/2012 1156   GFRNONAA >90 06/03/2012 1156   GFRAA >90 06/03/2012 1156   CBC    Component Value Date/Time   WBC 9.0 06/03/2012 1156   RBC 5.28* 06/03/2012 1156   HGB 14.1 06/03/2012 1156   HCT 42.3 06/03/2012 1156   PLT 557* 06/03/2012 1156   MCV 80.1 06/03/2012 1156   MCH 26.7 06/03/2012 1156   MCHC 33.3 06/03/2012 1156   RDW 16.0* 06/03/2012 1156   LYMPHSABS 3.6 06/03/2012 1156   MONOABS 0.5 06/03/2012 1156   EOSABS 0.0 06/03/2012 1156   BASOSABS 0.0 06/03/2012 1156        Review of Systems see hpi Current Outpatient Prescriptions  Medication Sig Dispense Refill  . Calcium Carbonate-Vitamin D 600-400 MG-UNIT per tablet Take 1 tablet by mouth daily.      . cyanocobalamin (,VITAMIN B-12,) 1000 MCG/ML injection Inject 1,000 mcg into the muscle every 30 (thirty) days.      . diphenhydrAMINE (BENADRYL) 25 mg capsule Take 25 mg by mouth at bedtime as needed for itching.      . folic acid (FOLVITE) 1 MG tablet Take 1 tablet (1 mg total) by mouth 2 (two) times daily.  100 tablet  3  . ibuprofen (ADVIL,MOTRIN) 800 MG tablet Take 800 mg by mouth daily.      . Multiple Vitamin (MULTIVITAMIN) tablet Take 1 tablet by mouth daily.       No current facility-administered medications for this visit.   Facility-Administered Medications Ordered in Other Visits  Medication Dose Route Frequency Provider Last Rate Last Dose  . sodium chloride 0.9 % injection 10 mL  10 mL Intracatheter PRN Pieter Partridge, MD   10 mL at 07/08/12 1410   Past Medical History  Diagnosis Date  . Folic acid deficiency 54/04/7060  . Iron deficiency anemia 02/07/2011  . B12 deficiency 02/07/2011  .  Celiac disease    Past Surgical History  Procedure Laterality Date  . Tonsillectomy    . Cholecystectomy    . Knee cartilage surgery Right    Allergies  Allergen Reactions  . Erythromycin Anaphylaxis  . Neosporin Original (Bacitracin-Neomycin-Polymyxin)     Swelling at site of application  . Sulfa Antibiotics Swelling    Sulfa eye drops caused eyes to swell  . Penicillins Rash        Objective:   Physical Exam  Filed Vitals:   07/08/12 1517  BP: 110/80  Pulse: 80  Height: 5' (1.524 m)  Weight: 199 lb 14.4 oz (90.674 kg)   Alert and oriented. Skin warm and dry. Oral mucosa is moist.   . Sclera anicteric, conjunctivae is pink. Thyroid not enlarged. No cervical lymphadenopathy. Lungs clear. Heart regular rate and rhythm.  Abdomen is soft. Bowel sounds are positive. No  hepatomegaly. Liver felt possibly 2 fingerbreadth's below sternum.  No abdominal masses felt. No tenderness.  No edema to lower extremities.       Assessment:    Fatty liver. Her liver did not feel enlarged today. Her transaminases are normal.     Plan:     OV in 6 months with a CMET. She was encouraged to diet and exercise.

## 2012-07-08 NOTE — Progress Notes (Signed)
Arrived today for iron infusion. Tolerated well.

## 2012-07-09 ENCOUNTER — Telehealth (INDEPENDENT_AMBULATORY_CARE_PROVIDER_SITE_OTHER): Payer: Self-pay | Admitting: *Deleted

## 2012-07-09 DIAGNOSIS — K760 Fatty (change of) liver, not elsewhere classified: Secondary | ICD-10-CM

## 2012-07-09 NOTE — Telephone Encounter (Signed)
.  Per Terri Setzer,NP 

## 2012-10-07 ENCOUNTER — Other Ambulatory Visit (HOSPITAL_COMMUNITY): Payer: BC Managed Care – PPO

## 2012-10-09 ENCOUNTER — Ambulatory Visit (HOSPITAL_COMMUNITY): Payer: BC Managed Care – PPO

## 2012-10-16 ENCOUNTER — Encounter (HOSPITAL_COMMUNITY): Payer: Self-pay

## 2012-12-24 ENCOUNTER — Encounter (INDEPENDENT_AMBULATORY_CARE_PROVIDER_SITE_OTHER): Payer: Self-pay | Admitting: *Deleted

## 2012-12-24 ENCOUNTER — Other Ambulatory Visit (INDEPENDENT_AMBULATORY_CARE_PROVIDER_SITE_OTHER): Payer: Self-pay | Admitting: *Deleted

## 2012-12-24 DIAGNOSIS — K760 Fatty (change of) liver, not elsewhere classified: Secondary | ICD-10-CM

## 2013-01-07 ENCOUNTER — Other Ambulatory Visit: Payer: Self-pay

## 2013-01-13 ENCOUNTER — Ambulatory Visit (INDEPENDENT_AMBULATORY_CARE_PROVIDER_SITE_OTHER): Payer: BC Managed Care – PPO | Admitting: Internal Medicine

## 2013-04-06 ENCOUNTER — Encounter (INDEPENDENT_AMBULATORY_CARE_PROVIDER_SITE_OTHER): Payer: Self-pay | Admitting: *Deleted

## 2013-06-22 ENCOUNTER — Ambulatory Visit (INDEPENDENT_AMBULATORY_CARE_PROVIDER_SITE_OTHER): Payer: No Typology Code available for payment source | Admitting: Women's Health

## 2013-06-22 ENCOUNTER — Encounter: Payer: Self-pay | Admitting: Women's Health

## 2013-06-22 ENCOUNTER — Other Ambulatory Visit (HOSPITAL_COMMUNITY)
Admission: RE | Admit: 2013-06-22 | Discharge: 2013-06-22 | Disposition: A | Discharge status: Home / Self Care | Payer: BC Managed Care – PPO | Source: Ambulatory Visit | Attending: Obstetrics & Gynecology | Admitting: Obstetrics & Gynecology

## 2013-06-22 VITALS — BP 134/84 | Ht 62.0 in | Wt 203.4 lb

## 2013-06-22 DIAGNOSIS — Z01419 Encounter for gynecological examination (general) (routine) without abnormal findings: Secondary | ICD-10-CM

## 2013-06-22 DIAGNOSIS — Z1151 Encounter for screening for human papillomavirus (HPV): Secondary | ICD-10-CM | POA: Insufficient documentation

## 2013-06-22 DIAGNOSIS — E669 Obesity, unspecified: Secondary | ICD-10-CM | POA: Insufficient documentation

## 2013-06-22 DIAGNOSIS — Z1212 Encounter for screening for malignant neoplasm of rectum: Secondary | ICD-10-CM

## 2013-06-22 DIAGNOSIS — A630 Anogenital (venereal) warts: Secondary | ICD-10-CM

## 2013-06-22 LAB — HEMOCCULT GUIAC POC 1CARD (OFFICE): Fecal Occult Blood, POC: NEGATIVE

## 2013-06-22 NOTE — Progress Notes (Addendum)
Patient ID: Sharon Lawrence, female   DOB: 1965/09/03, 48 y.o.   MRN: [id number] Subjective:   Sharon Lawrence is a 48 y.o. [id number] Caucasian female here for a routine well-woman exam.  Patient's last menstrual period was 06/12/2013.    Current complaints: has regular normal flow periods q 28d, but is tired of having periods, and would like to do something to stop them PCP: [facility] where she is empolyed       Does desire labs, has had diet pepsi today  Social History: Sexual: not sexually active Marital Status: single Living situation: with family Occupation: case Freight forwarder for NP at [employer] Tobacco/alcohol: no alcohol use, no tobacco- quit smoking >62yr ago, caffeine: diet sodas all day Illicit drugs: no history of illicit drug use  The following portions of the patient's history were reviewed and updated as appropriate: allergies, current medications, past family history, past medical history, past social history, past surgical history and problem list.  Gynecologic History [id number] SVD x 3, breast fed all babies Patient's last menstrual period was 06/12/2013. Contraception: abstinence Last Pap: >68yrs ago. Results were: normal Last mammogram: last July. Results were: normal, mom dx w/ breast CA Last TCS: last year, due for repeat, scheduling now. Sister dx w/ rectal CA @ 108yo  Obstetric History OB History  No data available    Review of Systems Patient denies any headaches, blurred vision, shortness of breath, chest pain, abdominal pain, problems urination, or intercourse. Does have h/o celiac disease/IBS so has bloating, constipation/diarrhea- no recent changes- followed by Dr. Dereck Leep.   Objective:  BP 134/84  Ht 5\' 2"  (1.575 m)  Wt 203 lb 6.4 oz (92.262 kg)  BMI 37.19 kg/m2  LMP 06/12/2013 Physical Exam  General:  Well developed, well nourished, no acute distress. She is alert and oriented x3. Skin:  Warm and dry Neck:  Midline trachea, no  thyromegaly or nodules Cardiovascular: Regular rate and rhythm, no murmur heard Lungs:  Effort normal, all lung fields clear to auscultation bilaterally Breasts:  No dominant palpable mass, retraction, or nipple discharge Abdomen:  Soft, non tender, no hepatosplenomegaly or masses Pelvic:   External genitalia is normal in appearance.   Vaginal: large condyloma Lt vaginal wall near cervix, neg acetic acid changes. Co-exam w/ JVF who recommends bringing back for visit w/ him to remove.   The cervix is bulbous, no CMT.  Thin prep pap is done w/ HR HPV cotesting.  Uterus is felt to be normal size, shape, and contour.   No adnexal masses or tenderness noted. Rectal: Good sphincter tone, no polyps, or hemorrhoids felt.  Hemoccult: neg Extremities:  No swelling or varicosities noted Psych:  She has a normal mood and affect  Assessment:   Healthy well-woman exam Vaginal condyloma Period management  Obesity  Plan:  F/U 1wk for fasting am labs: CBC, CMP, TSH, Lipid profile, A1C, then visit w/ JVF for removal of vaginal condyloma Discussed options for achieving amenorrhea: depo, mirena, ablation, hysterectomy, pt would like to try Mirena- will discuss further at next visit Increase activity, healthy diet, decrease carbs to achieve normal BMI Mammogram in July as directed or sooner if problems Colonoscopy- in process of scheduling now   [employer] CNM, [facility] 06/22/2013 11:07 AM

## 2013-06-22 NOTE — Patient Instructions (Signed)
Genital Warts Genital warts are a sexually transmitted infection. They may appear as small bumps on the tissues of the genital area. CAUSES  Genital warts are caused by a virus called human papillomavirus (HPV). HPV is the most common sexually transmitted disease (STD) and infection of the sex organs. This infection is spread by having unprotected sex with an infected person. It can be spread by vaginal, anal, and oral sex. Many people do not know they are infected. They may be infected for years without problems. However, even if they do not have problems, they can unknowingly pass the infection to their sexual partners. SYMPTOMS   Itching and irritation in the genital area.  Warts that bleed.  Painful sexual intercourse. DIAGNOSIS  Warts are usually recognized with the naked eye on the vagina, vulva, perineum, anus, and rectum. Certain tests can also diagnose genital warts, such as:  A Pap test.  A tissue sample (biopsy) exam.  Colposcopy. A magnifying tool is used to examine the vagina and cervix. The HPV cells will change color when certain solutions are used. TREATMENT  Warts can be removed by:  Applying certain chemicals, such as cantharidin or podophyllin.  Liquid nitrogen freezing (cryotherapy).  Immunotherapy with candida or trichophyton injections.  Laser treatment.  Burning with an electrified probe (electrocautery).  Interferon injections.  Surgery. PREVENTION  HPV vaccination can help prevent HPV infections that cause genital warts and that cause cancer of the cervix. It is recommended that the vaccination be given to people between the ages 9 to 26 years old. The vaccine might not work as well or might not work at all if you already have HPV. It should not be given to pregnant women. HOME CARE INSTRUCTIONS   It is important to follow your caregiver's instructions. The warts will not go away without treatment. Repeat treatments are often needed to get rid of warts.  Even after it appears that the warts are gone, the normal tissue underneath often remains infected.  Do not try to treat genital warts with medicine used to treat hand warts. This type of medicine is strong and can burn the skin in the genital area, causing more damage.  Tell your past and current sexual partner(s) that you have genital warts. They may be infected also and need treatment.  Avoid sexual contact while being treated.  Do not touch or scratch the warts. The infection may spread to other parts of your body.  Women with genital warts should have a cervical cancer check (Pap test) at least once a year. This type of cancer is slow-growing and can be cured if found early. Chances of developing cervical cancer are increased with HPV.  Inform your obstetrician about your warts in the event of pregnancy. This virus can be passed to the baby's respiratory tract. Discuss this with your caregiver.  Use a condom during sexual intercourse. Following treatment, the use of condoms will help prevent reinfection.  Ask your caregiver about using over-the-counter anti-itch creams. SEEK MEDICAL CARE IF:   Your treated skin becomes red, swollen, or painful.  You have a fever.  You feel generally ill.  You feel little lumps in and around your genital area.  You are bleeding or have painful sexual intercourse. MAKE SURE YOU:   Understand these instructions.  Will watch your condition.  Will get help right away if you are not doing well or get worse. Document Released: 02/16/2000 Document Revised: 05/13/2011 Document Reviewed: 08/27/2010 Exit[employer] Patient Information 2014 [employer].  

## 2013-07-05 ENCOUNTER — Ambulatory Visit: Payer: No Typology Code available for payment source | Admitting: Obstetrics and Gynecology

## 2013-07-07 ENCOUNTER — Ambulatory Visit (INDEPENDENT_AMBULATORY_CARE_PROVIDER_SITE_OTHER): Payer: No Typology Code available for payment source | Admitting: Obstetrics and Gynecology

## 2013-07-07 ENCOUNTER — Encounter: Payer: Self-pay | Admitting: Obstetrics and Gynecology

## 2013-07-07 ENCOUNTER — Other Ambulatory Visit: Payer: No Typology Code available for payment source

## 2013-07-07 ENCOUNTER — Other Ambulatory Visit: Payer: Self-pay | Admitting: Obstetrics and Gynecology

## 2013-07-07 VITALS — BP 120/80 | Ht 60.0 in | Wt 204.0 lb

## 2013-07-07 DIAGNOSIS — Z13220 Encounter for screening for lipoid disorders: Secondary | ICD-10-CM

## 2013-07-07 DIAGNOSIS — A630 Anogenital (venereal) warts: Secondary | ICD-10-CM

## 2013-07-07 DIAGNOSIS — Z0000 Encounter for general adult medical examination without abnormal findings: Secondary | ICD-10-CM

## 2013-07-07 DIAGNOSIS — Z1329 Encounter for screening for other suspected endocrine disorder: Secondary | ICD-10-CM

## 2013-07-07 LAB — CBC
HCT: 43.4 % (ref 36.0–46.0)
Hemoglobin: 14 g/dL (ref 12.0–15.0)
MCH: 26 pg (ref 26.0–34.0)
MCHC: 32.3 g/dL (ref 30.0–36.0)
MCV: 80.5 fL (ref 78.0–100.0)
Platelets: 562 10*3/uL — ABNORMAL HIGH (ref 150–400)
RBC: 5.39 MIL/uL — ABNORMAL HIGH (ref 3.87–5.11)
RDW: 16 % — ABNORMAL HIGH (ref 11.5–15.5)
WBC: 9.6 10*3/uL (ref 4.0–10.5)

## 2013-07-07 NOTE — Patient Instructions (Signed)
Genital Warts Genital warts are a sexually transmitted infection. They may appear as small bumps on the tissues of the genital area. CAUSES  Genital warts are caused by a virus called human papillomavirus (HPV). HPV is the most common sexually transmitted disease (STD) and infection of the sex organs. This infection is spread by having unprotected sex with an infected person. It can be spread by vaginal, anal, and oral sex. Many people do not know they are infected. They may be infected for years without problems. However, even if they do not have problems, they can unknowingly pass the infection to their sexual partners. SYMPTOMS   Itching and irritation in the genital area.  Warts that bleed.  Painful sexual intercourse. DIAGNOSIS  Warts are usually recognized with the naked eye on the vagina, vulva, perineum, anus, and rectum. Certain tests can also diagnose genital warts, such as:  A Pap test.  A tissue sample (biopsy) exam.  Colposcopy. A magnifying tool is used to examine the vagina and cervix. The HPV cells will change color when certain solutions are used. TREATMENT  Warts can be removed by:  Applying certain chemicals, such as cantharidin or podophyllin.  Liquid nitrogen freezing (cryotherapy).  Immunotherapy with candida or trichophyton injections.  Laser treatment.  Burning with an electrified probe (electrocautery).  Interferon injections.  Surgery. PREVENTION  HPV vaccination can help prevent HPV infections that cause genital warts and that cause cancer of the cervix. It is recommended that the vaccination be given to people between the ages 42 to 68 years old. The vaccine might not work as well or might not work at all if you already have HPV. It should not be given to pregnant women. HOME CARE INSTRUCTIONS   It is important to follow your caregiver's instructions. The warts will not go away without treatment. Repeat treatments are often needed to get rid of warts.  Even after it appears that the warts are gone, the normal tissue underneath often remains infected.  Do not try to treat genital warts with medicine used to treat hand warts. This type of medicine is strong and can burn the skin in the genital area, causing more damage.  Tell your past and current sexual partner(s) that you have genital warts. They may be infected also and need treatment.  Avoid sexual contact while being treated.  Do not touch or scratch the warts. The infection may spread to other parts of your body.  Women with genital warts should have a cervical cancer check (Pap test) at least once a year. This type of cancer is slow-growing and can be cured if found early. Chances of developing cervical cancer are increased with HPV.  Inform your obstetrician about your warts in the event of pregnancy. This virus can be passed to the baby's respiratory tract. Discuss this with your caregiver.  Use a condom during sexual intercourse. Following treatment, the use of condoms will help prevent reinfection.  Ask your caregiver about using over-the-counter anti-itch creams. SEEK MEDICAL CARE IF:   Your treated skin becomes red, swollen, or painful.  You have a fever.  You feel generally ill.  You feel little lumps in and around your genital area.  You are bleeding or have painful sexual intercourse. MAKE SURE YOU:   Understand these instructions.  Will watch your condition.  Will get help right away if you are not doing well or get worse. Document Released: 02/16/2000 Document Revised: 05/13/2011 Document Reviewed: 08/27/2010 [facility] Patient Information 2014 [place], [place].

## 2013-07-07 NOTE — Progress Notes (Signed)
This chart was scribed by Jenne Campus, Medical Scribe, for Dr. Mallory Shirk on 07/07/13 at 11:47 AM. This chart was reviewed by Dr. Mallory Shirk for accuracy.   [facility] Visit  Patient name: Sharon Lawrence MRN [id number]  Date of birth: 04-11-1965  CC & HPI:  LORENNA LURRY is a 48 y.o. female presenting today for removal of large condyloma on Lt vaginal wall near cervix that was neg for acetic acid changes. Is requesting Mirena IUD. Advised to return before end of next menses.   ROS:  Is requesting Mirena for menses control, no sexual partner in 14 years No complaints   Pertinent History Reviewed:  Medical & Surgical Hx:  Reviewed: Significant for non-alcoholic fatty liver disease, s/p D&C Medications: Reviewed & Updated - see associated section Social History: Reviewed -  reports that she has quit smoking. Her smoking use included Cigarettes. She has a 3 pack-year smoking history. She has never used smokeless tobacco.  Objective Findings:  Vitals: BP 120/80  Ht 5' (1.524 m)  Wt 204 lb (92.534 kg)  BMI 39.84 kg/m2  LMP 06/12/2013 Chaperone present for exam which was performed with pt's permission Physical Examination: General appearance - alert, well appearing, and in no distress and oriented to person, place, and time Mental status - alert, oriented to person, place, and time, normal mood, behavior, speech, dress, motor activity, and thought processes  Procedure:  Consented, and Time out performed Pelvic - VULVA: vulvar lesion large condyloma to the left vaginal wall, Surrounding area coated with local anesthetic. Then removed with clamping stalk, then sharply excising lesion, and applying Monsel's solution  Patient tolerated the procedure well.   Post procedure instructions given and patient told to wash area in thirty minutes  Assessment & Plan:  A: 1.  Incisional of large condyloma from the left vaginal wall  P: 2. Wants Mirena, will come in near the end of next  menses and f/u with Maudie Mercury booker

## 2013-07-08 LAB — LIPID PANEL
Cholesterol: 157 mg/dL (ref 0–200)
HDL: 37 mg/dL — ABNORMAL LOW
LDL Cholesterol: 97 mg/dL (ref 0–99)
Total CHOL/HDL Ratio: 4.2 ratio
Triglycerides: 116 mg/dL
VLDL: 23 mg/dL (ref 0–40)

## 2013-07-08 LAB — HEMOGLOBIN A1C
Hgb A1c MFr Bld: 5.9 % — ABNORMAL HIGH (ref <5.7)
Mean Plasma Glucose: 123 mg/dL — ABNORMAL HIGH (ref <117)

## 2013-07-08 LAB — COMPREHENSIVE METABOLIC PANEL WITH GFR
ALT: 27 U/L (ref 0–35)
AST: 23 U/L (ref 0–37)
Albumin: 4.1 g/dL (ref 3.5–5.2)
Alkaline Phosphatase: 77 U/L (ref 39–117)
BUN: 8 mg/dL (ref 6–23)
CO2: 23 meq/L (ref 19–32)
Calcium: 9.2 mg/dL (ref 8.4–10.5)
Chloride: 104 meq/L (ref 96–112)
Creat: 0.62 mg/dL (ref 0.50–1.10)
Glucose, Bld: 75 mg/dL (ref 70–99)
Potassium: 4.5 meq/L (ref 3.5–5.3)
Sodium: 138 meq/L (ref 135–145)
Total Bilirubin: 0.4 mg/dL (ref 0.2–1.2)
Total Protein: 6.7 g/dL (ref 6.0–8.3)

## 2013-07-08 LAB — TSH: TSH: 2.471 u[IU]/mL (ref 0.350–4.500)

## 2013-07-11 ENCOUNTER — Telehealth: Payer: Self-pay | Admitting: Obstetrics and Gynecology

## 2013-07-11 NOTE — Telephone Encounter (Signed)
Unable to contact pt. Will ask for pt to be called by staff.

## 2013-07-12 ENCOUNTER — Ambulatory Visit (INDEPENDENT_AMBULATORY_CARE_PROVIDER_SITE_OTHER): Payer: No Typology Code available for payment source | Admitting: Women's Health

## 2013-07-12 ENCOUNTER — Encounter: Payer: Self-pay | Admitting: Women's Health

## 2013-07-12 VITALS — BP 120/72 | Ht 60.0 in | Wt 209.0 lb

## 2013-07-12 DIAGNOSIS — Z3202 Encounter for pregnancy test, result negative: Secondary | ICD-10-CM

## 2013-07-12 DIAGNOSIS — Z30430 Encounter for insertion of intrauterine contraceptive device: Secondary | ICD-10-CM

## 2013-07-12 LAB — POCT URINE PREGNANCY: Preg Test, Ur: NEGATIVE

## 2013-07-12 NOTE — Progress Notes (Signed)
Patient ID: Sharon Lawrence, female   DOB: [birth year], 48 y.o.   MRN: [id number] [name] is a 48 y.o. year old [id number] Caucasian female who presents for placement of a Mirena IUD for period management.  Patient's last menstrual period was 07/07/2013. BP 120/72  Ht 5' (1.524 m)  Wt 209 lb (94.802 kg)  BMI 40.82 kg/m2  LMP 07/07/2013 Last sexual intercourse was 63yrs ago, and pregnancy test today was neg  The risks and benefits of the method and placement have been thouroughly reviewed with the patient and all questions were answered.  Specifically the patient is aware of failure rate of 03/998, expulsion of the IUD and of possible perforation.  The patient is aware of irregular bleeding due to the method and understands the incidence of irregular bleeding diminishes with time.  Signed copy of informed consent in chart.   Time out was performed.  A graves speculum was placed in the vagina.  The cervix was visualized, prepped using Betadine, and grasped with a single tooth tenaculum. The uterus was found to be anteroflexed and it sounded to 8 cm.  Mirena IUD placed per manufacturer's recommendations.   The strings were trimmed to 3 cm.  Sonogram was performed and the proper placement of the IUD was verified via transvaginal u/s.   The patient was given post procedure instructions, including signs and symptoms of infection and to check for the strings after each menses or each month, and refraining from intercourse or anything in the vagina for 3 days.  She was given a Mirena care card with date Mirena placed, and date Mirena to be removed.  She is scheduled for a f/u appointment in 4 weeks.  Tawnya Crook CNM, [facility] 07/12/2013 4:39 PM

## 2013-07-12 NOTE — Patient Instructions (Signed)
Nothing in vagina for 3 days (no sex, douching, tampons, etc...)  Check your strings once a month to make sure you can feel them, if you are not able to please let us know  If you develop a fever of 100.4 or more in the next few weeks, or if you develop severe abdominal pain, please let [place] know  Use a backup method of birth control, such as condoms, for 2 weeks   Intrauterine Device Insertion, Care After Refer to this sheet in the next few weeks. These instructions provide you with information on caring for yourself after your procedure. Your health care provider may also give you more specific instructions. Your treatment has been planned according to current medical practices, but problems sometimes occur. Call your health care provider if you have any problems or questions after your procedure. WHAT TO EXPECT AFTER THE PROCEDURE Insertion of the IUD may cause some discomfort, such as cramping. The cramping should improve after the IUD is in place. You may have bleeding after the procedure. This is normal. It varies from light spotting for a few days to menstrual-like bleeding. When the IUD is in place, a string will extend past the cervix into the vagina for 1 2 inches. The strings should not bother you or your partner. If they do, talk to your health care provider.  HOME CARE INSTRUCTIONS   Check your intrauterine device (IUD) to make sure it is in place before you resume sexual activity. You should be able to feel the strings. If you cannot feel the strings, something may be wrong. The IUD may have fallen out of the uterus, or the uterus may have been punctured (perforated) during placement. Also, if the strings are getting longer, it may mean that the IUD is being forced out of the uterus. You no longer have full protection from pregnancy if any of these problems occur.  You may resume sexual intercourse if you are not having problems with the IUD. The copper IUD is considered immediately  effective, and the hormone IUD works right away if inserted within 7 days of your period starting. You will need to use a backup method of birth control for 7 days if the IUD in inserted at any other time in your cycle.  Continue to check that the IUD is still in place by feeling for the strings after every menstrual period.  You may need to take pain medicine such as acetaminophen or ibuprofen. Only take medicines as directed by your health care provider. SEEK MEDICAL CARE IF:   You have bleeding that is heavier or lasts longer than a normal menstrual cycle.  You have a fever.  You have increasing cramps or abdominal pain not relieved with medicine.  You have abdominal pain that does not seem to be related to the same area of earlier cramping and pain.  You are lightheaded, unusually weak, or faint.  You have abnormal vaginal discharge or smells.  You have pain during sexual intercourse.  You cannot feel the IUD strings, or the IUD string has gotten longer.  You feel the IUD at the opening of the cervix in the vagina.  You think you are pregnant, or you miss your menstrual period.  The IUD string is hurting your sex partner. MAKE SURE YOU:  Understand these instructions.  Will watch your condition.  Will get help right away if you are not doing well or get worse. Document Released: 10/17/2010 Document Revised: 12/09/2012 Document Reviewed: 08/09/2012  [employer] Patient Information 2014 [employer].  

## 2013-07-21 ENCOUNTER — Ambulatory Visit: Payer: No Typology Code available for payment source | Admitting: Women's Health

## 2013-08-09 ENCOUNTER — Ambulatory Visit: Payer: No Typology Code available for payment source | Admitting: Women's Health

## 2013-12-17 ENCOUNTER — Other Ambulatory Visit: Payer: Self-pay

## 2013-12-25 ENCOUNTER — Other Ambulatory Visit: Payer: Self-pay | Admitting: Nurse Practitioner

## 2014-01-03 ENCOUNTER — Encounter: Payer: Self-pay | Admitting: Women's Health

## 2014-03-17 ENCOUNTER — Other Ambulatory Visit (HOSPITAL_COMMUNITY): Payer: Self-pay | Admitting: Family

## 2014-03-17 DIAGNOSIS — Z1231 Encounter for screening mammogram for malignant neoplasm of breast: Secondary | ICD-10-CM

## 2014-03-24 ENCOUNTER — Ambulatory Visit (HOSPITAL_COMMUNITY)
Admission: RE | Admit: 2014-03-24 | Discharge: 2014-03-24 | Disposition: A | Discharge status: Home / Self Care | Payer: No Typology Code available for payment source | Source: Ambulatory Visit | Attending: Family | Admitting: Family

## 2014-03-24 DIAGNOSIS — Z1231 Encounter for screening mammogram for malignant neoplasm of breast: Secondary | ICD-10-CM | POA: Insufficient documentation

## 2015-11-22 ENCOUNTER — Ambulatory Visit (HOSPITAL_COMMUNITY)
Admission: RE | Admit: 2015-11-22 | Discharge: 2015-11-22 | Disposition: A | Discharge status: Home / Self Care | Payer: 59 | Source: Ambulatory Visit | Attending: Emergency Medicine | Admitting: Emergency Medicine

## 2015-11-22 ENCOUNTER — Other Ambulatory Visit (HOSPITAL_COMMUNITY): Payer: Self-pay | Admitting: Emergency Medicine

## 2015-11-22 DIAGNOSIS — M7989 Other specified soft tissue disorders: Secondary | ICD-10-CM

## 2015-12-29 ENCOUNTER — Ambulatory Visit (INDEPENDENT_AMBULATORY_CARE_PROVIDER_SITE_OTHER): Payer: 59 | Admitting: Cardiovascular Disease

## 2015-12-29 ENCOUNTER — Encounter: Payer: Self-pay | Admitting: Cardiovascular Disease

## 2015-12-29 VITALS — BP 144/81 | HR 55 | Ht 60.0 in | Wt 206.0 lb

## 2015-12-29 DIAGNOSIS — R0602 Shortness of breath: Secondary | ICD-10-CM

## 2015-12-29 DIAGNOSIS — I10 Essential (primary) hypertension: Secondary | ICD-10-CM

## 2015-12-29 DIAGNOSIS — R609 Edema, unspecified: Secondary | ICD-10-CM

## 2015-12-29 NOTE — Patient Instructions (Signed)
Medication Instructions:  Your physician recommends that you continue on your current medications as directed. Please refer to the Current Medication list given to you today.  Labwork: none  Testing/Procedures: Your physician has requested that you have an echocardiogram. Echocardiography is a painless test that uses sound waves to create images of your heart. It provides your doctor with information about the size and shape of your heart and how well your heart's chambers and valves are working. This procedure takes approximately one hour. There are no restrictions for this procedure [facility] [address]  Follow-Up: Your physician recommends that you schedule a follow-up appointment in:1 MONTH OV  If you need a refill on your cardiac medications before your next appointment, please call your pharmacy.

## 2015-12-29 NOTE — Progress Notes (Signed)
Cardiology Office Note   Date:  12/31/2015   ID:  Sharon Lawrence, DOB 10/06/1965, MRN [id number]  PCP:  Joyice Faster, FNP  Cardiologist:   Skeet Latch, MD   Chief Complaint  Patient presents with  . New Patient (Initial Visit)    sob w/ flutter in chest.     History of Present Illness: Sharon Lawrence is a 50 y.o. female with hypertension who presents for an evaluation of left lower extremity edema and syncope.  She was seen by Ivan Anchors, FNP 11/22/15 and complained of one week of left leg pain and swelling.  She had a left knee x-ray that noted arthritis.  She also had lower extremity Dopplers that were negative for DVT. She has noted swelling in bilateral lower extremities for several years. It is worse in the left than the right. She does wear compression stockings at work which helps significantly. The swelling is worse on the weekends when she hasn't worn her compression.  She hasn't been on any of her blood pressure medications lately and was switched from HCTZ to spironolactone, which worsened the edema.  She denies orthopnea or PND.  She notes mild shortness of breath with extreme exertion but no chest pain.  Sharon Lawrence also reports occasional heart fluttering.  It occurs sporadically approximately 1-2 times per month and lasts for 30 seconds.  There is no associated chest pain, lightheadedness or dizziness but she does get short of breath and anxious.  It doesn't occur with exertion and sometimes awakens her at night.  This has been better since she started iron supplementation.  In the past she had recurrent episodes of syncope but none recently.  It was before she learned that she had Celiac disease and always occurred in the setting of abdominal pain and diarrhea.  She participates in yoga at least three times per week.  She also has a healthy diet and is gluten free.  She doesn't eat many vegetables.  Sharon Lawrence reports that she hasn't taken her blood pressure medication in  nearly a week.  It typically runs in the 110s-120s/60-70s.  She continues to smoke 1/4 ppd.  She quit at age 89 and then started back when her husband died 32 years ago.   Past Medical History:  Diagnosis Date  . B12 deficiency 02/07/2011  . Celiac disease   . Folic acid deficiency 17/11/1503  . Iron deficiency anemia 02/07/2011    Past Surgical History:  Procedure Laterality Date  . CHOLECYSTECTOMY    . KNEE CARTILAGE SURGERY Right   . TONSILLECTOMY       Current Outpatient Prescriptions  Medication Sig Dispense Refill  . Calcium Carbonate-Vitamin D 600-400 MG-UNIT per tablet Take 1 tablet by mouth daily.    . diphenhydrAMINE (BENADRYL) 25 mg capsule Take 25 mg by mouth at bedtime as needed for itching.    . folic acid (FOLVITE) 1 MG tablet Take 1 tablet (1 mg total) by mouth 2 (two) times daily. 100 tablet 3  . ibuprofen (ADVIL,MOTRIN) 800 MG tablet Take 800 mg by mouth daily.    . Multiple Vitamin (MULTIVITAMIN) tablet Take 1 tablet by mouth daily.     No current facility-administered medications for this visit.     Allergies:   Erythromycin; Neosporin original [bacitracin-neomycin-polymyxin]; Sulfa antibiotics; Tramadol; and Penicillins    Social History:  The patient  reports that she has quit smoking. Her smoking use included Cigarettes. She has a 3.00 pack-year smoking history. She has  never used smokeless tobacco. She reports that she does not drink alcohol or use drugs.   Family History:  The patient's family history includes CAD in her maternal grandfather and paternal grandmother; Cancer in her mother and sister; Cirrhosis in her paternal grandfather; Diabetes in her paternal grandmother; Hypertension in her father and mother; Hypothyroidism in her father and paternal grandmother.    ROS:  Please see the history of present illness.   Otherwise, review of systems are positive for none.   All other systems are reviewed and negative.    PHYSICAL EXAM: VS:  BP (!) 144/81    Pulse (!) 55   Ht 5' (1.524 m)   Wt 93.4 kg (206 lb)   BMI 40.23 kg/m  , BMI Body mass index is 40.23 kg/m. GENERAL:  Well appearing HEENT:  Pupils equal round and reactive, fundi not visualized, oral mucosa unremarkable NECK:  No jugular venous distention, waveform within normal limits, carotid upstroke brisk and symmetric, no bruits, no thyromegaly LYMPHATICS:  No cervical adenopathy LUNGS:  Clear to auscultation bilaterally HEART:  RRR.  PMI not displaced or sustained,S1 and S2 within normal limits, no S3, no S4, no clicks, no rubs, no murmurs ABD:  Flat, positive bowel sounds normal in frequency in pitch, no bruits, no rebound, no guarding, no midline pulsatile mass, no hepatomegaly, no splenomegaly EXT:  2 plus pulses throughout, no edema, no cyanosis no clubbing SKIN:  No rashes no nodules NEURO:  Cranial nerves II through XII grossly intact, motor grossly intact throughout PSYCH:  Cognitively intact, oriented to person place and time    EKG:  EKG is ordered today. The ekg ordered today demonstrates Sinus rhythm.  Rate 70 bpm.     Recent Labs: No results found for requested labs within last 8760 hours.   12/11/11: Sodium 141, potassium 4.5, BUN 11, creatinine 0.68 AST 24, ALT 26 WBC 7.9, hemoglobin 13.3, hematocrit 40.7, platelets 489 Lipid Panel    Component Value Date/Time   CHOL 157 07/07/2013 1038   TRIG 116 07/07/2013 1038   HDL 37 (L) 07/07/2013 1038   CHOLHDL 4.2 07/07/2013 1038   VLDL 23 07/07/2013 1038   LDLCALC 97 07/07/2013 1038      Wt Readings from Last 3 Encounters:  12/29/15 93.4 kg (206 lb)  07/12/13 94.8 kg (209 lb)  07/07/13 92.5 kg (204 lb)      ASSESSMENT AND PLAN:  # Lower extremity edema: # Shortness of breath:  Sharon Lawrence symptoms seem to be related to either venous insufficiency or diastolic dysfunction in the setting of stopping her HCTZ.  She was switched to spironolactone and has noted increased swelling.  We will get an echo  and recommend restarting HCTZ instead of spironolactone.  Also recommended that she continue to wear compression stockings.   # Syncope: Symptoms occurred in the setting of abdominal pain and seem to be vasovagal syncope.  No recent events.   # Tobacco abuse: We discussed smoking cessation for 5 minutes.  She is not ready to quit at this time.   Current medicines are reviewed at length with the patient today.  The patient does not have concerns regarding medicines.  The following changes have been made:  Restart HCTZ  Labs/ tests ordered today include:   Orders Placed This Encounter  Procedures  . EKG 12-Lead  . ECHOCARDIOGRAM COMPLETE     Disposition:   FU with Lucius Wise C. Oval Linsey, MD, [facility] in 1 month    This note was  written with the assistance of speech recognition software.  Please excuse any transcriptional errors.  Signed, Coleman Kalas C. Oval Linsey, MD, [facility]  12/31/2015 7:57 PM    [place]

## 2016-01-10 ENCOUNTER — Other Ambulatory Visit (HOSPITAL_COMMUNITY): Payer: 59

## 2016-01-15 ENCOUNTER — Telehealth: Payer: Self-pay | Admitting: Cardiovascular Disease

## 2016-01-15 NOTE — Telephone Encounter (Signed)
Records received from [facility] for apt on 02/02/16 with Dr Oval Linsey. Records given to [employer] (medical records) CN

## 2016-01-19 ENCOUNTER — Telehealth: Payer: Self-pay | Admitting: Cardiovascular Disease

## 2016-01-19 ENCOUNTER — Ambulatory Visit (HOSPITAL_COMMUNITY)
Admission: RE | Admit: 2016-01-19 | Discharge: 2016-01-19 | Disposition: A | Discharge status: Home / Self Care | Payer: 59 | Source: Ambulatory Visit | Attending: Cardiovascular Disease | Admitting: Cardiovascular Disease

## 2016-01-19 DIAGNOSIS — I340 Nonrheumatic mitral (valve) insufficiency: Secondary | ICD-10-CM | POA: Insufficient documentation

## 2016-01-19 DIAGNOSIS — E669 Obesity, unspecified: Secondary | ICD-10-CM | POA: Insufficient documentation

## 2016-01-19 DIAGNOSIS — Z6841 Body Mass Index (BMI) 40.0 and over, adult: Secondary | ICD-10-CM | POA: Insufficient documentation

## 2016-01-19 DIAGNOSIS — I071 Rheumatic tricuspid insufficiency: Secondary | ICD-10-CM | POA: Diagnosis not present

## 2016-01-19 DIAGNOSIS — R0602 Shortness of breath: Secondary | ICD-10-CM | POA: Insufficient documentation

## 2016-01-19 DIAGNOSIS — R609 Edema, unspecified: Secondary | ICD-10-CM | POA: Diagnosis not present

## 2016-01-19 DIAGNOSIS — R0600 Dyspnea, unspecified: Secondary | ICD-10-CM | POA: Diagnosis present

## 2016-01-19 NOTE — Progress Notes (Signed)
*  PRELIMINARY RESULTS* Echocardiogram 2D Echocardiogram has been performed.  Sharon Lawrence 01/19/2016, 10:22 AM

## 2016-02-02 ENCOUNTER — Ambulatory Visit: Payer: 59 | Admitting: Cardiovascular Disease

## 2016-02-29 NOTE — Telephone Encounter (Signed)
Closed encounter °

## 2016-09-15 ENCOUNTER — Encounter (HOSPITAL_COMMUNITY): Payer: Self-pay | Admitting: Emergency Medicine

## 2016-09-15 ENCOUNTER — Emergency Department (HOSPITAL_COMMUNITY)
Admission: EM | Admit: 2016-09-15 | Discharge: 2016-09-15 | Disposition: A | Discharge status: Home / Self Care | Payer: 59 | Attending: Emergency Medicine | Admitting: Emergency Medicine

## 2016-09-15 DIAGNOSIS — H5711 Ocular pain, right eye: Secondary | ICD-10-CM | POA: Diagnosis present

## 2016-09-15 DIAGNOSIS — X58XXXA Exposure to other specified factors, initial encounter: Secondary | ICD-10-CM | POA: Diagnosis not present

## 2016-09-15 DIAGNOSIS — Y999 Unspecified external cause status: Secondary | ICD-10-CM | POA: Insufficient documentation

## 2016-09-15 DIAGNOSIS — Y929 Unspecified place or not applicable: Secondary | ICD-10-CM | POA: Insufficient documentation

## 2016-09-15 DIAGNOSIS — S0501XA Injury of conjunctiva and corneal abrasion without foreign body, right eye, initial encounter: Secondary | ICD-10-CM

## 2016-09-15 DIAGNOSIS — Y939 Activity, unspecified: Secondary | ICD-10-CM | POA: Insufficient documentation

## 2016-09-15 DIAGNOSIS — F17210 Nicotine dependence, cigarettes, uncomplicated: Secondary | ICD-10-CM | POA: Diagnosis not present

## 2016-09-15 MED ORDER — LEVOFLOXACIN 0.5 % OP SOLN: OPHTHALMIC | 0 refills | Status: DC

## 2016-09-15 MED ORDER — FLUORESCEIN SODIUM 0.6 MG OP STRP
1.0000 | ORAL_STRIP | Freq: Once | OPHTHALMIC | Status: AC
  Administered 2016-09-15: 1 via OPHTHALMIC
  Filled 2016-09-15: qty 1

## 2016-09-15 MED ORDER — TETRACAINE HCL 0.5 % OP SOLN
2.0000 [drp] | Freq: Once | OPHTHALMIC | Status: AC
  Administered 2016-09-15: 2 [drp] via OPHTHALMIC
  Filled 2016-09-15: qty 4

## 2016-09-15 NOTE — ED Notes (Signed)
Visual accuity  OD 20/200  OS 20/200  OU 20/200

## 2016-09-15 NOTE — ED Triage Notes (Signed)
Patient c/o right eye pain. Per patient had corneal scratch to right eye 2 weeks ago. Per patient eye was better after about 3 days until she woke up yesterday. Patient states that she believes she aggravated it some how. Patient reports using the Tobrex eye drops in which afterward her eyelid started to swell and pain became worse.

## 2016-09-15 NOTE — Discharge Instructions (Signed)
There appears to be a corneal abrasion noted on exam. Use the levofloxacin drops, one drop every 2 hours while awake for 2 days, then every 8 hours for 5 days.  Follow-up with your eye doctor tomorrow. Return to the ED should symptoms worsen.

## 2016-09-15 NOTE — ED Notes (Addendum)
Pt reports that she had a corneal abrasion 3 weeks ago- it resolved and she saw her eye doctor in [place].   Started wear ing her contact lenses again and awakened yesterday morning with R eye pain and swelling  Pt reports that she has been taking her contact lenses out at night  She reports that she cannot complete a visual acuity exam as she cannot see anything without her glasses

## 2016-09-15 NOTE — ED Provider Notes (Signed)
[facility] DEPT Provider Note   CSN: [id number] Arrival date & time: 09/15/16  1124     History   Chief Complaint Chief Complaint  Patient presents with  . Eye Pain    HPI Sharon Lawrence Sharon Lawrence is a 51 y.o. female.  HPI   Sharon Lawrence Sharon Lawrence is a 51 y.o. female, with a history of Anemia, presenting to the ED with Right eye pain beginning 2 days ago. Patient states she woke up pain in the right eye, that has worsened since onset. She states she cannot accurately say if she has had vision changes because it hurts to open her eyes. Patient states she had a corneal abrasion 2-3 weeks ago, was seen by her eye doctor, Sharon Lawrence, OD, and was treated with Tobrex. She had resolution in her corneal abrasion symptoms after a few days. When patient began to have pain again in the eye, she started to use the Tobrex drops again. Yesterday, she endorses onset of swelling, photophobia, and increased pain. Patient is a contact lens wearer. She wears thirty-day contacts. She put in a new pair about 2 weeks ago. She states she does not wear them at night. She has not worn her contacts since her pain returned.  Denies fever/chills, nausea/vomiting, drainage other than tearing, or any other complaints.  States her previous corneal abrasion was on the superior medial side of the right eye.  Past Medical History:  Diagnosis Date  . B12 deficiency 02/07/2011  . Celiac disease   . Folic acid deficiency 95/03/8839  . Iron deficiency anemia 02/07/2011    Patient Active Problem List   Diagnosis Date Noted  . Vaginal condyloma 06/22/2013  . Obesity 06/22/2013  . Fatty liver disease, nonalcoholic 66/08/3014  . Folic acid deficiency 03/12/3233  . Iron deficiency 02/07/2011  . B12 deficiency 02/07/2011    Past Surgical History:  Procedure Laterality Date  . CHOLECYSTECTOMY    . KNEE CARTILAGE SURGERY Right   . TONSILLECTOMY      OB History    Gravida Para Term Preterm AB Living   3 3   3   3    SAB TAB  Ectopic Multiple Live Births           3       Home Medications    Prior to Admission medications   Medication Sig Start Date End Date Taking? Authorizing Provider  Calcium Carbonate-Vitamin D 600-400 MG-UNIT per tablet Take 1 tablet by mouth daily.    [provider]  diphenhydrAMINE (BENADRYL) 25 mg capsule Take 25 mg by mouth at bedtime as needed for itching.    [provider]  folic acid (FOLVITE) 1 MG tablet Take 1 tablet (1 mg total) by mouth 2 (two) times daily. 06/17/12   Everardo All, MD  ibuprofen (ADVIL,MOTRIN) 800 MG tablet Take 800 mg by mouth daily.    [provider]  levofloxacin Theodoro Clock) 0.5 % ophthalmic solution Instill 1 drop in the right eye every 2 hours while awake for 2 days, THEN every 8 hours for 5 days. 09/15/16   Gaje Tennyson C, PA-C  Multiple Vitamin (MULTIVITAMIN) tablet Take 1 tablet by mouth daily.    [provider]    Family History Family History  Problem Relation Age of Onset  . Cancer Mother   . Hypertension Mother   . Hypertension Father   . Hypothyroidism Father   . Cancer Sister   . CAD Maternal Grandfather   . Cirrhosis Paternal Grandfather   .  CAD Paternal Grandmother   . Diabetes Paternal Grandmother   . Hypothyroidism Paternal Grandmother     Social History Social History  Substance Use Topics  . Smoking status: Current Every Day Smoker    Packs/day: 0.25    Years: 12.00    Types: Cigarettes  . Smokeless tobacco: Never Used  . Alcohol use No     Allergies   Erythromycin; Neosporin original [bacitracin-neomycin-polymyxin]; Sulfa antibiotics; Tobrex [tobramycin]; Tramadol; and Penicillins   Review of Systems Review of Systems  Constitutional: Negative for chills and fever.  HENT: Positive for facial swelling.   Eyes: Positive for photophobia and pain. Negative for discharge. Visual disturbance: patient does not know.  Gastrointestinal: Negative for nausea and vomiting.     Physical  Exam Updated Vital Signs BP (!) 141/73 (BP Location: Right Arm)   Pulse 83   Temp 98.1 F (36.7 C) (Oral)   Resp 18   Ht 5' (1.524 m)   Wt 86.2 kg (190 lb)   SpO2 98%   BMI 37.11 kg/m   Physical Exam  Constitutional: She appears well-developed and well-nourished. No distress.  HENT:  Head: Normocephalic and atraumatic.  Eyes: Pupils are equal, round, and reactive to light. EOM are normal. Right conjunctiva is injected.  EOMs are intact, however, she initially has pain with EOM testing. She endorses pain with pupillary constriction and consensual pupillary constriction. Pain with EOMs and with pupillary response completely resolved with tetracaine.  No contact lenses in place.  Woods Lamp exam shows increased uptake of fluorescein.  Slit lamp exam was also performed with corneal abrasion at 9:00 position of the right eye. No noted signs of corneal ulcer, iritis, anterior chamber damage, foreign bodies, or globe damage.  No noted dendritic lesions No noted cells or flare  No noted angle changes when compared to opposite eye. Tono-Pen values: Right eye: 18  Left eye: 19      Visual Acuity  Right Eye Distance:  20/200 Left Eye Distance:  20/200 Bilateral Distance:  20/200  Right Eye Near:   Left Eye Near:    Bilateral Near:    Patient did not have her glasses available. She states her current visual acuity is normal for her.  Neck: Neck supple.  Cardiovascular: Normal rate and regular rhythm.   Pulmonary/Chest: Effort normal. No respiratory distress.  Abdominal: Soft. There is no tenderness. There is no guarding.  Musculoskeletal: She exhibits no edema.  Lymphadenopathy:    She has no cervical adenopathy.  Neurological: She is alert.  Skin: Skin is warm and dry. She is not diaphoretic.  Psychiatric: She has a normal mood and affect. Her behavior is normal.  Nursing note and vitals reviewed.    ED Treatments / Results  Labs (all labs ordered are listed, but only  abnormal results are displayed) Labs Reviewed - No data to display  EKG  EKG Interpretation None       Radiology No results found.  Procedures Procedures (including critical care time)  Medications Ordered in ED Medications  fluorescein ophthalmic strip 1 strip (1 strip Right Eye Given 09/15/16 1156)  tetracaine (PONTOCAINE) 0.5 % ophthalmic solution 2 drop (2 drops Both Eyes Given 09/15/16 1156)     Initial Impression / Assessment and Plan / ED Course  I have reviewed the triage vital signs and the nursing notes.  Pertinent labs & imaging results that were available during my care of the patient were reviewed by me and considered in my medical decision making (see  chart for details).      Patient presents with right eye pain. Evidence of new corneal abrasion on exam. Treatment initiated. Patient follow-up with her eye doctor within 24 hours. Return precautions discussed.   Final Clinical Impressions(s) / ED Diagnoses   Final diagnoses:  Abrasion of right cornea, initial encounter    New Prescriptions Discharge Medication List as of 09/15/2016 12:24 PM    START taking these medications   Details  levofloxacin (QUIXIN) 0.5 % ophthalmic solution Instill 1 drop in the right eye every 2 hours while awake for 2 days, THEN every 8 hours for 5 days., Print         Lorayne Bender, [place] 09/16/16 5631    Nat Christen, MD 09/17/16 [phone]

## 2017-01-01 DIAGNOSIS — Z2821 Immunization not carried out because of patient refusal: Secondary | ICD-10-CM | POA: Insufficient documentation

## 2017-08-18 ENCOUNTER — Ambulatory Visit (HOSPITAL_COMMUNITY)
Admission: RE | Admit: 2017-08-18 | Discharge: 2017-08-18 | Disposition: A | Discharge status: Home / Self Care | Payer: 59 | Source: Ambulatory Visit | Attending: Emergency Medicine | Admitting: Emergency Medicine

## 2017-08-18 ENCOUNTER — Encounter (HOSPITAL_COMMUNITY): Payer: Self-pay

## 2017-08-18 ENCOUNTER — Other Ambulatory Visit (HOSPITAL_COMMUNITY): Payer: Self-pay | Admitting: Emergency Medicine

## 2017-08-18 DIAGNOSIS — R1084 Generalized abdominal pain: Secondary | ICD-10-CM

## 2017-08-18 DIAGNOSIS — R112 Nausea with vomiting, unspecified: Secondary | ICD-10-CM

## 2017-08-18 DIAGNOSIS — R197 Diarrhea, unspecified: Secondary | ICD-10-CM

## 2017-08-22 ENCOUNTER — Ambulatory Visit (HOSPITAL_COMMUNITY)
Admission: RE | Admit: 2017-08-22 | Discharge: 2017-08-22 | Disposition: A | Discharge status: Home / Self Care | Payer: 59 | Source: Ambulatory Visit | Attending: Emergency Medicine | Admitting: Emergency Medicine

## 2017-08-22 DIAGNOSIS — R112 Nausea with vomiting, unspecified: Secondary | ICD-10-CM | POA: Insufficient documentation

## 2017-08-22 DIAGNOSIS — R1084 Generalized abdominal pain: Secondary | ICD-10-CM | POA: Diagnosis present

## 2017-08-22 DIAGNOSIS — I700 Atherosclerosis of aorta: Secondary | ICD-10-CM | POA: Diagnosis not present

## 2017-08-22 DIAGNOSIS — R197 Diarrhea, unspecified: Secondary | ICD-10-CM

## 2017-08-22 DIAGNOSIS — K7689 Other specified diseases of liver: Secondary | ICD-10-CM | POA: Diagnosis not present

## 2017-08-22 MED ORDER — IOPAMIDOL (ISOVUE-300) INJECTION 61%
100.0000 mL | Freq: Once | INTRAVENOUS | Status: AC | PRN
  Administered 2017-08-22: 100 mL via INTRAVENOUS

## 2017-08-22 MED ORDER — IOPAMIDOL (ISOVUE-300) INJECTION 61%
30.0000 mL | Freq: Once | INTRAVENOUS | Status: AC | PRN
  Administered 2017-08-22: 30 mL via ORAL

## 2017-09-11 ENCOUNTER — Ambulatory Visit (INDEPENDENT_AMBULATORY_CARE_PROVIDER_SITE_OTHER): Payer: 59 | Admitting: Internal Medicine

## 2017-09-11 ENCOUNTER — Encounter (INDEPENDENT_AMBULATORY_CARE_PROVIDER_SITE_OTHER): Payer: Self-pay | Admitting: Internal Medicine

## 2017-09-11 ENCOUNTER — Encounter (INDEPENDENT_AMBULATORY_CARE_PROVIDER_SITE_OTHER): Payer: Self-pay | Admitting: *Deleted

## 2017-09-11 ENCOUNTER — Telehealth (INDEPENDENT_AMBULATORY_CARE_PROVIDER_SITE_OTHER): Payer: Self-pay | Admitting: *Deleted

## 2017-09-11 VITALS — BP 132/90 | HR 76 | Temp 98.1°F | Ht 60.0 in | Wt 213.7 lb

## 2017-09-11 DIAGNOSIS — Z800 Family history of malignant neoplasm of digestive organs: Secondary | ICD-10-CM | POA: Diagnosis not present

## 2017-09-11 DIAGNOSIS — R112 Nausea with vomiting, unspecified: Secondary | ICD-10-CM

## 2017-09-11 DIAGNOSIS — K900 Celiac disease: Secondary | ICD-10-CM | POA: Insufficient documentation

## 2017-09-11 MED ORDER — PEG 3350-KCL-NA BICARB-NACL 420 G PO SOLR: 4000.0000 mL | Freq: Once | ORAL | 0 refills | Status: AC

## 2017-09-11 NOTE — Progress Notes (Signed)
Subjective:    Patient ID: Sharon Lawrence, female    DOB: 07-30-1965, 52 y.o.   MRN: [id number]  HPI Referred by Burman Freestone NP for abdominal pain, diarrhea, vomiting. Symptoms started about 9 weeks ago. Symptoms started gradually. Started in the evening. Diarrhea comes and goes. Usually every other day.  Having 10 stools a day or more when she has diarrhea. She has temdermess mid section of abdomen when she vomits and lower abdominal pain when she has diarrhea. Last episode of diarrhea was 2 days. Her stool was loose this am and she had has 2 stools today. She says she had diarrhea and her hemorrhoids bled.  She is sticking with a gluten free diet.  Family hx rectal cancer in a younger sister and mother had multiple polyps. She says she has lost about 10 pounds since this started.    Her Last EGD/Colonoscopy was in 2012. (Celiac disease, intermittent nausea and vomiting, epigastric pain, left sided abdominal pain, and diarrhea. Family hx significant in a younger sister with rectal CA and mother  With hx of over a dozen adenomas removed.  FINAL DIAGNOSES: 1. Abnormal appearance to postbulbar duodenal mucosa, possibly     indicative of ongoing activity secondary to celiac disease. 2. Colonoscopy performed to cecum with evaluation of right colon     limited because of quality of prep, prep distal to hepatic flexure     was satisfactory. 3. Two small polyps ablated via cold biopsy one from transverse colon     other one from rectosigmoid. 4. Small external hemorrhoids. ( I do not see a path report in computer)  08/12/2017 H and H  13.1 and 39.8, MCV 77.0.    Review of Systems Past Medical History:  Diagnosis Date  . B12 deficiency 02/07/2011  . Celiac disease   . Folic acid deficiency 93/07/7015  . Iron deficiency anemia 02/07/2011    Past Surgical History:  Procedure Laterality Date  . CHOLECYSTECTOMY    . KNEE CARTILAGE SURGERY Right   . TONSILLECTOMY      Allergies  Allergen  Reactions  . Erythromycin Anaphylaxis  . Neosporin Original [Bacitracin-Neomycin-Polymyxin]     Swelling at site of application  . Sulfa Antibiotics Swelling    Sulfa eye drops caused eyes to swell  . Tobrex [Tobramycin] Swelling  . Tramadol Hives  . Penicillins Rash    Current Outpatient Medications on File Prior to Visit  Medication Sig Dispense Refill  . cholecalciferol (VITAMIN D) 1000 units tablet Take 5,000 Units by mouth daily.    . Cyanocobalamin (VITAMIN B-12 IJ) Inject as directed.    . gabapentin (NEURONTIN) 300 MG capsule Take 900 mg by mouth at bedtime.     No current facility-administered medications on file prior to visit.         Objective:   Physical Exam Blood pressure 132/90, pulse 76, temperature 98.1 F (36.7 C), height 5' (1.524 m), weight 213 lb 11.2 oz (96.9 kg). Alert and oriented. Skin warm and dry. Oral mucosa is moist.   . Sclera anicteric, conjunctivae is pink. Thyroid not enlarged. No cervical lymphadenopathy. Lungs clear. Heart regular rate and rhythm.  Abdomen is soft. Bowel sounds are positive. No hepatomegaly. No abdominal masses felt. No tenderness.             Assessment & Plan:  N,V, Abdominal. Know hx of Celiac disease. Will proceed with an EGD. Diarrhea, change in stool. Family hx of rectal cancer and mother with multiple polyps:  Needs surveillance Colonoscopy.

## 2017-09-11 NOTE — Telephone Encounter (Signed)
Patient needs trilyte 

## 2017-09-11 NOTE — Patient Instructions (Signed)
The risks of bleeding, perforation and infection were reviewed with patient.  

## 2017-09-15 ENCOUNTER — Emergency Department (HOSPITAL_COMMUNITY): Payer: 59

## 2017-09-15 ENCOUNTER — Inpatient Hospital Stay (HOSPITAL_COMMUNITY)
Admission: EM | Admit: 2017-09-15 | Discharge: 2017-09-17 | DRG: 247 | Disposition: A | Discharge status: Home / Self Care | Payer: 59 | Attending: Interventional Cardiology | Admitting: Interventional Cardiology

## 2017-09-15 ENCOUNTER — Encounter (HOSPITAL_COMMUNITY)
Admission: EM | Disposition: A | Discharge status: Home / Self Care | Payer: Self-pay | Source: Home / Self Care | Attending: Interventional Cardiology

## 2017-09-15 ENCOUNTER — Encounter (HOSPITAL_COMMUNITY): Payer: Self-pay | Admitting: Emergency Medicine

## 2017-09-15 ENCOUNTER — Other Ambulatory Visit: Payer: Self-pay

## 2017-09-15 DIAGNOSIS — I2119 ST elevation (STEMI) myocardial infarction involving other coronary artery of inferior wall: Secondary | ICD-10-CM

## 2017-09-15 DIAGNOSIS — I2510 Atherosclerotic heart disease of native coronary artery without angina pectoris: Secondary | ICD-10-CM | POA: Diagnosis present

## 2017-09-15 DIAGNOSIS — E669 Obesity, unspecified: Secondary | ICD-10-CM | POA: Diagnosis present

## 2017-09-15 DIAGNOSIS — I2111 ST elevation (STEMI) myocardial infarction involving right coronary artery: Secondary | ICD-10-CM | POA: Diagnosis present

## 2017-09-15 DIAGNOSIS — E538 Deficiency of other specified B group vitamins: Secondary | ICD-10-CM | POA: Diagnosis present

## 2017-09-15 DIAGNOSIS — Z6839 Body mass index (BMI) 39.0-39.9, adult: Secondary | ICD-10-CM

## 2017-09-15 DIAGNOSIS — Z885 Allergy status to narcotic agent status: Secondary | ICD-10-CM | POA: Diagnosis not present

## 2017-09-15 DIAGNOSIS — Z720 Tobacco use: Secondary | ICD-10-CM

## 2017-09-15 DIAGNOSIS — R110 Nausea: Secondary | ICD-10-CM | POA: Diagnosis not present

## 2017-09-15 DIAGNOSIS — I213 ST elevation (STEMI) myocardial infarction of unspecified site: Secondary | ICD-10-CM

## 2017-09-15 DIAGNOSIS — Z882 Allergy status to sulfonamides status: Secondary | ICD-10-CM | POA: Diagnosis not present

## 2017-09-15 DIAGNOSIS — R079 Chest pain, unspecified: Secondary | ICD-10-CM | POA: Diagnosis present

## 2017-09-15 DIAGNOSIS — E782 Mixed hyperlipidemia: Secondary | ICD-10-CM | POA: Diagnosis not present

## 2017-09-15 DIAGNOSIS — E785 Hyperlipidemia, unspecified: Secondary | ICD-10-CM

## 2017-09-15 DIAGNOSIS — Z881 Allergy status to other antibiotic agents status: Secondary | ICD-10-CM | POA: Diagnosis not present

## 2017-09-15 DIAGNOSIS — K900 Celiac disease: Secondary | ICD-10-CM | POA: Diagnosis present

## 2017-09-15 DIAGNOSIS — Z880 Allergy status to penicillin: Secondary | ICD-10-CM

## 2017-09-15 DIAGNOSIS — Z8249 Family history of ischemic heart disease and other diseases of the circulatory system: Secondary | ICD-10-CM | POA: Diagnosis not present

## 2017-09-15 DIAGNOSIS — F17210 Nicotine dependence, cigarettes, uncomplicated: Secondary | ICD-10-CM | POA: Diagnosis present

## 2017-09-15 DIAGNOSIS — I219 Acute myocardial infarction, unspecified: Secondary | ICD-10-CM

## 2017-09-15 DIAGNOSIS — Z955 Presence of coronary angioplasty implant and graft: Secondary | ICD-10-CM

## 2017-09-15 HISTORY — PX: LEFT HEART CATH AND CORONARY ANGIOGRAPHY: CATH118249

## 2017-09-15 HISTORY — PX: CORONARY STENT INTERVENTION: CATH118234

## 2017-09-15 HISTORY — DX: ST elevation (STEMI) myocardial infarction involving other coronary artery of inferior wall: I21.19

## 2017-09-15 HISTORY — DX: Acute myocardial infarction, unspecified: I21.9

## 2017-09-15 LAB — POCT I-STAT, CHEM 8
BUN: 9 mg/dL (ref 6–20)
Calcium, Ion: 1.18 mmol/L (ref 1.15–1.40)
Chloride: 105 mmol/L (ref 98–111)
Creatinine, Ser: 0.8 mg/dL (ref 0.44–1.00)
Glucose, Bld: 97 mg/dL (ref 70–99)
HCT: 42 % (ref 36.0–46.0)
Hemoglobin: 14.3 g/dL (ref 12.0–15.0)
Potassium: 4.2 mmol/L (ref 3.5–5.1)
Sodium: 138 mmol/L (ref 135–145)
TCO2: 20 mmol/L — ABNORMAL LOW (ref 22–32)

## 2017-09-15 LAB — POCT ACTIVATED CLOTTING TIME
Activated Clotting Time: 235 seconds
Activated Clotting Time: 268 seconds

## 2017-09-15 LAB — BASIC METABOLIC PANEL
Anion gap: 9 (ref 5–15)
BUN: 12 mg/dL (ref 6–20)
CO2: 26 mmol/L (ref 22–32)
Calcium: 8.3 mg/dL — ABNORMAL LOW (ref 8.9–10.3)
Chloride: 103 mmol/L (ref 98–111)
Creatinine, Ser: 0.93 mg/dL (ref 0.44–1.00)
GFR calc Af Amer: >60 mL/min (ref >60)
GFR calc non Af Amer: >60 mL/min (ref >60)
Glucose, Bld: 123 mg/dL — ABNORMAL HIGH (ref 70–99)
Potassium: 3.9 mmol/L (ref 3.5–5.1)
Sodium: 138 mmol/L (ref 135–145)

## 2017-09-15 LAB — I-STAT CHEM 8, ED
BUN: 10 mg/dL (ref 6–20)
Calcium, Ion: 1.13 mmol/L — ABNORMAL LOW (ref 1.15–1.40)
Chloride: 102 mmol/L (ref 98–111)
Creatinine, Ser: 0.9 mg/dL (ref 0.44–1.00)
Glucose, Bld: 119 mg/dL — ABNORMAL HIGH (ref 70–99)
HCT: 46 % (ref 36.0–46.0)
Hemoglobin: 15.6 g/dL — ABNORMAL HIGH (ref 12.0–15.0)
Potassium: 3.9 mmol/L (ref 3.5–5.1)
Sodium: 137 mmol/L (ref 135–145)
TCO2: 23 mmol/L (ref 22–32)

## 2017-09-15 LAB — GLUCOSE, CAPILLARY: Glucose-Capillary: 105 mg/dL — ABNORMAL HIGH (ref 70–99)

## 2017-09-15 LAB — CBC
HCT: 44 % (ref 36.0–46.0)
Hemoglobin: 14.4 g/dL (ref 12.0–15.0)
MCH: 25.4 pg — ABNORMAL LOW (ref 26.0–34.0)
MCHC: 32.7 g/dL (ref 30.0–36.0)
MCV: 77.7 fL — ABNORMAL LOW (ref 78.0–100.0)
Platelets: 562 10*3/uL — ABNORMAL HIGH (ref 150–400)
RBC: 5.66 MIL/uL — ABNORMAL HIGH (ref 3.87–5.11)
RDW: 17.9 % — ABNORMAL HIGH (ref 11.5–15.5)
WBC: 12.8 10*3/uL — ABNORMAL HIGH (ref 4.0–10.5)

## 2017-09-15 LAB — TROPONIN I
Troponin I: 0.76 ng/mL (ref <0.03)
Troponin I: 0.98 ng/mL (ref <0.03)

## 2017-09-15 LAB — I-STAT TROPONIN, ED: Troponin i, poc: 0 ng/mL (ref 0.00–0.08)

## 2017-09-15 LAB — MRSA PCR SCREENING: MRSA by PCR: NEGATIVE

## 2017-09-15 LAB — I-STAT BETA HCG BLOOD, ED (MC, WL, AP ONLY): I-stat hCG, quantitative: 5.5 m[IU]/mL — ABNORMAL HIGH (ref <5)

## 2017-09-15 SURGERY — LEFT HEART CATH AND CORONARY ANGIOGRAPHY
Anesthesia: LOCAL | Laterality: Right

## 2017-09-15 MED ORDER — PROMETHAZINE HCL 25 MG PO TABS
25.0000 mg | ORAL_TABLET | Freq: Three times a day (TID) | ORAL | Status: DC | PRN
  Administered 2017-09-15 – 2017-09-16 (×3): 25 mg via ORAL
  Filled 2017-09-15 (×3): qty 1

## 2017-09-15 MED ORDER — SODIUM CHLORIDE 0.9% FLUSH: 3.0000 mL | INTRAVENOUS | Status: DC | PRN

## 2017-09-15 MED ORDER — FENTANYL CITRATE (PF) 100 MCG/2ML IJ SOLN
INTRAMUSCULAR | Status: DC | PRN
  Administered 2017-09-15 (×3): 25 ug via INTRAVENOUS

## 2017-09-15 MED ORDER — GABAPENTIN 300 MG PO CAPS
900.0000 mg | ORAL_CAPSULE | Freq: Every day | ORAL | Status: DC
  Administered 2017-09-15 – 2017-09-16 (×2): 900 mg via ORAL
  Filled 2017-09-15 (×2): qty 3

## 2017-09-15 MED ORDER — NITROGLYCERIN 0.4 MG SL SUBL: 0.4000 mg | SUBLINGUAL_TABLET | SUBLINGUAL | Status: DC | PRN

## 2017-09-15 MED ORDER — TIROFIBAN (AGGRASTAT) BOLUS VIA INFUSION
INTRAVENOUS | Status: DC | PRN
  Administered 2017-09-15: 2302.5 ug via INTRAVENOUS

## 2017-09-15 MED ORDER — TIROFIBAN HCL IV 12.5 MG/250 ML
INTRAVENOUS | Status: DC | PRN
  Administered 2017-09-15: 0.15 ug/kg/min via INTRAVENOUS

## 2017-09-15 MED ORDER — ASPIRIN 81 MG PO CHEW
CHEWABLE_TABLET | ORAL | Status: AC
  Filled 2017-09-15: qty 4

## 2017-09-15 MED ORDER — METOPROLOL TARTRATE 12.5 MG HALF TABLET
12.5000 mg | ORAL_TABLET | Freq: Two times a day (BID) | ORAL | Status: DC
  Administered 2017-09-15 – 2017-09-16 (×3): 12.5 mg via ORAL
  Filled 2017-09-15 (×3): qty 1

## 2017-09-15 MED ORDER — TICAGRELOR 90 MG PO TABS
90.0000 mg | ORAL_TABLET | Freq: Two times a day (BID) | ORAL | Status: DC
  Administered 2017-09-15 – 2017-09-17 (×4): 90 mg via ORAL
  Filled 2017-09-15 (×4): qty 1

## 2017-09-15 MED ORDER — LIDOCAINE HCL (PF) 1 % IJ SOLN
INTRAMUSCULAR | Status: DC | PRN
  Administered 2017-09-15: 2 mL

## 2017-09-15 MED ORDER — HEPARIN (PORCINE) IN NACL 1000-0.9 UT/500ML-% IV SOLN
INTRAVENOUS | Status: DC | PRN
  Administered 2017-09-15: 500 mL

## 2017-09-15 MED ORDER — ASPIRIN 81 MG PO CHEW
81.0000 mg | CHEWABLE_TABLET | Freq: Every day | ORAL | Status: DC
  Administered 2017-09-16 – 2017-09-17 (×2): 81 mg via ORAL
  Filled 2017-09-15 (×2): qty 1

## 2017-09-15 MED ORDER — HEPARIN (PORCINE) IN NACL 1000-0.9 UT/500ML-% IV SOLN
INTRAVENOUS | Status: AC
  Filled 2017-09-15: qty 1000

## 2017-09-15 MED ORDER — ASPIRIN 81 MG PO CHEW
324.0000 mg | CHEWABLE_TABLET | Freq: Once | ORAL | Status: AC
  Administered 2017-09-15: 324 mg via ORAL

## 2017-09-15 MED ORDER — MIDAZOLAM HCL 2 MG/2ML IJ SOLN
INTRAMUSCULAR | Status: AC
  Filled 2017-09-15: qty 2

## 2017-09-15 MED ORDER — SODIUM CHLORIDE 0.9% FLUSH
3.0000 mL | Freq: Two times a day (BID) | INTRAVENOUS | Status: DC
  Administered 2017-09-16: 3 mL via INTRAVENOUS

## 2017-09-15 MED ORDER — TIROFIBAN HCL IV 12.5 MG/250 ML
INTRAVENOUS | Status: AC
  Filled 2017-09-15: qty 250

## 2017-09-15 MED ORDER — ONDANSETRON HCL 4 MG/2ML IJ SOLN
4.0000 mg | Freq: Four times a day (QID) | INTRAMUSCULAR | Status: DC | PRN
  Administered 2017-09-15: 4 mg via INTRAVENOUS
  Filled 2017-09-15: qty 2

## 2017-09-15 MED ORDER — HEPARIN SODIUM (PORCINE) 1000 UNIT/ML IJ SOLN
INTRAMUSCULAR | Status: AC
  Filled 2017-09-15: qty 1

## 2017-09-15 MED ORDER — FENTANYL CITRATE (PF) 100 MCG/2ML IJ SOLN
INTRAMUSCULAR | Status: AC
  Filled 2017-09-15: qty 2

## 2017-09-15 MED ORDER — HEPARIN (PORCINE) IN NACL 100-0.45 UNIT/ML-% IJ SOLN
INTRAMUSCULAR | Status: AC
  Filled 2017-09-15: qty 250

## 2017-09-15 MED ORDER — LIDOCAINE HCL (PF) 1 % IJ SOLN
INTRAMUSCULAR | Status: AC
  Filled 2017-09-15: qty 30

## 2017-09-15 MED ORDER — LOPERAMIDE HCL 2 MG PO CAPS
4.0000 mg | ORAL_CAPSULE | ORAL | Status: DC | PRN
  Administered 2017-09-15: 4 mg via ORAL
  Filled 2017-09-15: qty 2

## 2017-09-15 MED ORDER — MIDAZOLAM HCL 2 MG/2ML IJ SOLN
INTRAMUSCULAR | Status: DC | PRN
  Administered 2017-09-15 (×2): 1 mg via INTRAVENOUS
  Administered 2017-09-15: 2 mg via INTRAVENOUS

## 2017-09-15 MED ORDER — NITROGLYCERIN 0.4 MG SL SUBL
SUBLINGUAL_TABLET | SUBLINGUAL | Status: AC
  Filled 2017-09-15: qty 3

## 2017-09-15 MED ORDER — TICAGRELOR 90 MG PO TABS
ORAL_TABLET | ORAL | Status: AC
  Filled 2017-09-15: qty 1

## 2017-09-15 MED ORDER — SODIUM CHLORIDE 0.9 % IV SOLN
INTRAVENOUS | Status: AC
  Administered 2017-09-15: 14:00:00 via INTRAVENOUS

## 2017-09-15 MED ORDER — LABETALOL HCL 5 MG/ML IV SOLN: 10.0000 mg | INTRAVENOUS | Status: AC | PRN

## 2017-09-15 MED ORDER — IOPAMIDOL (ISOVUE-370) INJECTION 76%
INTRAVENOUS | Status: DC | PRN
  Administered 2017-09-15: 110 mL via INTRA_ARTERIAL

## 2017-09-15 MED ORDER — HEPARIN (PORCINE) IN NACL 100-0.45 UNIT/ML-% IJ SOLN
1000.0000 [IU]/h | Freq: Once | INTRAMUSCULAR | Status: AC
  Administered 2017-09-15: 1000 [IU]/h via INTRAVENOUS

## 2017-09-15 MED ORDER — NITROGLYCERIN 1 MG/10 ML FOR IR/CATH LAB
INTRA_ARTERIAL | Status: AC
  Filled 2017-09-15: qty 10

## 2017-09-15 MED ORDER — FAMOTIDINE 20 MG PO TABS
10.0000 mg | ORAL_TABLET | Freq: Every day | ORAL | Status: DC
  Administered 2017-09-15 – 2017-09-16 (×2): 10 mg via ORAL
  Filled 2017-09-15 (×2): qty 1

## 2017-09-15 MED ORDER — SUCRALFATE 1 G PO TABS
1.0000 g | ORAL_TABLET | Freq: Two times a day (BID) | ORAL | Status: DC | PRN
  Filled 2017-09-15: qty 1

## 2017-09-15 MED ORDER — TICAGRELOR 90 MG PO TABS
ORAL_TABLET | ORAL | Status: DC | PRN
  Administered 2017-09-15: 180 mg via ORAL

## 2017-09-15 MED ORDER — SODIUM CHLORIDE 0.9 % IV SOLN: 250.0000 mL | INTRAVENOUS | Status: DC | PRN

## 2017-09-15 MED ORDER — ATORVASTATIN CALCIUM 80 MG PO TABS
80.0000 mg | ORAL_TABLET | Freq: Every day | ORAL | Status: DC
  Administered 2017-09-15 – 2017-09-16 (×2): 80 mg via ORAL
  Filled 2017-09-15 (×2): qty 1

## 2017-09-15 MED ORDER — HEPARIN SODIUM (PORCINE) 1000 UNIT/ML IJ SOLN
INTRAMUSCULAR | Status: DC | PRN
  Administered 2017-09-15: 7000 [IU] via INTRAVENOUS
  Administered 2017-09-15: 2000 [IU] via INTRAVENOUS

## 2017-09-15 MED ORDER — SODIUM CHLORIDE 0.9 % IV BOLUS
250.0000 mL | Freq: Once | INTRAVENOUS | Status: AC
  Administered 2017-09-15: 250 mL via INTRAVENOUS

## 2017-09-15 MED ORDER — HYDRALAZINE HCL 20 MG/ML IJ SOLN: 5.0000 mg | INTRAMUSCULAR | Status: AC | PRN

## 2017-09-15 MED ORDER — SODIUM CHLORIDE 0.9 % IV SOLN
INTRAVENOUS | Status: AC | PRN
  Administered 2017-09-15: 250 mL via INTRAVENOUS

## 2017-09-15 MED ORDER — VERAPAMIL HCL 2.5 MG/ML IV SOLN
INTRAVENOUS | Status: DC | PRN
  Administered 2017-09-15: 10 mL via INTRA_ARTERIAL

## 2017-09-15 MED ORDER — IOPAMIDOL (ISOVUE-370) INJECTION 76%
INTRAVENOUS | Status: AC
  Filled 2017-09-15: qty 125

## 2017-09-15 MED ORDER — VERAPAMIL HCL 2.5 MG/ML IV SOLN
INTRAVENOUS | Status: AC
  Filled 2017-09-15: qty 2

## 2017-09-15 MED ORDER — NITROGLYCERIN 1 MG/10 ML FOR IR/CATH LAB
INTRA_ARTERIAL | Status: DC | PRN
  Administered 2017-09-15 (×2): 200 ug via INTRA_ARTERIAL

## 2017-09-15 MED ORDER — ACETAMINOPHEN 325 MG PO TABS: 650.0000 mg | ORAL_TABLET | ORAL | Status: DC | PRN

## 2017-09-15 SURGICAL SUPPLY — 16 items
CATH 5FR JL3.5 JR4 ANG PIG MP (CATHETERS) ×1 IMPLANT
CATH INFINITI 4FR JL3.5 (CATHETERS) ×1 IMPLANT
CATH LAUNCHER 5F JR4 (CATHETERS) ×1 IMPLANT
COVER PRB 48X5XTLSCP FOLD TPE (BAG) IMPLANT
COVER PROBE 5X48 (BAG) ×3
DEVICE RAD COMP TR BAND LRG (VASCULAR PRODUCTS) ×1 IMPLANT
GLIDESHEATH SLEND SS 6F .021 (SHEATH) ×1 IMPLANT
GUIDEWIRE INQWIRE 1.5J.035X260 (WIRE) IMPLANT
INQWIRE 1.5J .035X260CM (WIRE) ×3
KIT ENCORE 26 ADVANTAGE (KITS) ×1 IMPLANT
KIT HEART LEFT (KITS) ×3 IMPLANT
PACK CARDIAC CATHETERIZATION (CUSTOM PROCEDURE TRAY) ×3 IMPLANT
STENT SYNERGY DES 4X24 (Permanent Stent) ×1 IMPLANT
TRANSDUCER W/STOPCOCK (MISCELLANEOUS) ×3 IMPLANT
TUBING CIL FLEX 10 FLL-RA (TUBING) ×3 IMPLANT
WIRE ASAHI PROWATER 180CM (WIRE) ×1 IMPLANT

## 2017-09-15 NOTE — ED Notes (Signed)
Pt leaving with [facility] now. Nad. Pain free.

## 2017-09-15 NOTE — ED Triage Notes (Signed)
Patient complaining of chest pain starting 1 hour ago with shortness of breath and dizziness.

## 2017-09-15 NOTE — ED Notes (Signed)
Pt states she is still pain free

## 2017-09-15 NOTE — ED Notes (Signed)
Pt is pain free, no ntg given at this time

## 2017-09-15 NOTE — ED Notes (Signed)
[facility] present  

## 2017-09-15 NOTE — H&P (Addendum)
History & Physical    Patient ID: Sharon Lawrence MRN: [id number], DOB/AGE: 04-17-1965   Admit date: 09/15/2017   Primary Physician: Joyice Faster, FNP Primary Cardiologist: Dr. Irish Lack  Patient Profile    52 yo female with PMH of IDA, B12 deficiency, Celiac disease who presented to [facility] with chest pain and found to have a STEMI. Transferred to [facility] for emergent cardiac cath.   Past Medical History   Past Medical History:  Diagnosis Date  . B12 deficiency 02/07/2011  . Celiac disease   . Folic acid deficiency 35/07/7320  . Iron deficiency anemia 02/07/2011    Past Surgical History:  Procedure Laterality Date  . CHOLECYSTECTOMY    . CORONARY STENT INTERVENTION Right 09/15/2017   Procedure: CORONARY STENT INTERVENTION;  Surgeon: Jettie Booze, MD;  Location: [facility];  Service: Cardiovascular;  Laterality: Right;  RCA  . KNEE CARTILAGE SURGERY Right   . LEFT HEART CATH AND CORONARY ANGIOGRAPHY N/A 09/15/2017   Procedure: LEFT HEART CATH AND CORONARY ANGIOGRAPHY;  Surgeon: Jettie Booze, MD;  Location: [facility];  Service: Cardiovascular;  Laterality: N/A;  . TONSILLECTOMY       Allergies  Allergies  Allergen Reactions  . Erythromycin Anaphylaxis  . Neosporin Original [Bacitracin-Neomycin-Polymyxin]     Swelling at site of application  . Sulfa Antibiotics Swelling    Sulfa eye drops caused eyes to swell  . Tobrex [Tobramycin] Swelling  . Tramadol Hives  . Penicillins Rash    History of Present Illness    Sharon Lawrence is a 52 yo female with PMH of  IDA, B12 deficiency, and Celiac disease. Reports being followed by a PCP but never had any cardiac issues in the past. Family hx of CAD in maternal grandfather and mother. Recently saw GI about n/v/d. She is not a smoker. Reports being at work today when she experienced a sudden onset of centralized chest pain around 10am. Pain increased and she had a co-worker take her to the ED at [facility]. EKG there  showed acute ST elevation in the inferior leads and Code STEMI was called. She was given 373m ASA and heparin. [facility] called to transport to [facility] emergent cardiac cath. Reports being pain free at the time of arrival.   Home Medications    Prior to Admission medications   Medication Sig Start Date End Date Taking? Authorizing Provider  cholecalciferol (VITAMIN D) 1000 units tablet Take 5,000 Units by mouth daily.    [provider]  Cyanocobalamin (VITAMIN B-12 IJ) Inject as directed.    [provider]  gabapentin (NEURONTIN) 300 MG capsule Take 900 mg by mouth at bedtime.    [provider]  hydrochlorothiazide (HYDRODIURIL) 50 MG tablet Take 1 tablet by mouth daily. 06/11/17   [provider]  metoCLOPramide (REGLAN) 5 MG tablet Take 1 tablet by mouth 3 (three) times daily as needed. 07/30/17   [provider]  ondansetron (ZOFRAN-ODT) 8 MG disintegrating tablet Take 1 tablet by mouth 3 (three) times daily as needed. 08/11/17   [provider]  promethazine (PHENERGAN) 25 MG tablet Take 1 tablet by mouth 3 (three) times daily as needed. 08/11/17   [provider]  ranitidine (ZANTAC) 150 MG tablet Take 1 tablet by mouth daily. 07/30/17   [provider]  sucralfate (CARAFATE) 1 g tablet Take 1 tablet by mouth 2 (two) times daily as needed. 07/30/17   [provider]    Family History  Family History  Problem Relation Age of Onset  . Cancer Mother   . Hypertension Mother   . Hypertension Father   . Hypothyroidism Father   . Cancer Sister   . CAD Maternal Grandfather   . Cirrhosis Paternal Grandfather   . CAD Paternal Grandmother   . Diabetes Paternal Grandmother   . Hypothyroidism Paternal Grandmother     Social History    Social History   Socioeconomic History  . Marital status: Single    Spouse name: Not on file  . Number of children: Not on file  . Years of education: Not on file  .  Highest education level: Not on file  Occupational History  . Not on file  Social Needs  . Financial resource strain: Not on file  . Food insecurity:    Worry: Not on file    Inability: Not on file  . Transportation needs:    Medical: Not on file    Non-medical: Not on file  Tobacco Use  . Smoking status: Current Every Day Smoker    Packs/day: 0.25    Years: 12.00    Pack years: 3.00    Types: Cigarettes  . Smokeless tobacco: Never Used  Substance and Sexual Activity  . Alcohol use: No  . Drug use: No  . Sexual activity: Never  Lifestyle  . Physical activity:    Days per week: Not on file    Minutes per session: Not on file  . Stress: Not on file  Relationships  . Social connections:    Talks on phone: Not on file    Gets together: Not on file    Attends religious service: Not on file    Active member of club or organization: Not on file    Attends meetings of clubs or organizations: Not on file    Relationship status: Not on file  . Intimate partner violence:    Fear of current or ex partner: Not on file    Emotionally abused: Not on file    Physically abused: Not on file    Forced sexual activity: Not on file  Other Topics Concern  . Not on file  Social History Narrative  . Not on file     Review of Systems    See HPI  All other systems reviewed and are otherwise negative except as noted above.  Physical Exam    Blood pressure 131/79, pulse 91, temperature 98.2 F (36.8 C), temperature source Oral, resp. rate (!) 23, height 5' (1.524 m), weight 211 lb 13.8 oz (96.1 kg), SpO2 100 %.  General: Pleasant, NAD Psych: Normal affect. Neuro: Alert and oriented X 3. Moves all extremities spontaneously. HEENT: Normal  Neck: Supple without bruits or JVD. Lungs:  Resp regular and unlabored, CTA. Heart: RRR no s3, s4, or murmurs. Abdomen: Soft, non-tender, non-distended, BS + x 4.  Extremities: No clubbing, cyanosis or edema. DP/PT/Radials 2+ and equal  bilaterally.  Labs    Troponin [facility] of Care Test) Recent Labs    09/15/17 1116  TROPIPOC 0.00   No results for input(s): CKTOTAL, CKMB, TROPONINI in the last 72 hours. Lab Results  Component Value Date   WBC 12.8 (H) 09/15/2017   HGB 14.4 09/15/2017   HCT 44.0 09/15/2017   MCV 77.7 (L) 09/15/2017   PLT 562 (H) 09/15/2017    Recent Labs  Lab 09/15/17 1132  NA 138  K 3.9  CL 103  CO2 26  BUN 12  CREATININE 0.93  CALCIUM 8.3*  GLUCOSE 123*   Lab Results  Component Value Date   CHOL 157 07/07/2013   HDL 37 (L) 07/07/2013   LDLCALC 97 07/07/2013   TRIG 116 07/07/2013   No results found for: [facility]   Radiology Studies    Ct Abdomen Pelvis W Contrast  Result Date: 08/22/2017 CLINICAL DATA:  Abdominal pain. Nausea and vomiting for several days. EXAM: CT ABDOMEN AND PELVIS WITH CONTRAST TECHNIQUE: Multidetector CT imaging of the abdomen and pelvis was performed using the standard protocol following bolus administration of intravenous contrast. CONTRAST:  171m ISOVUE-300 IOPAMIDOL (ISOVUE-300) INJECTION 61%, 340mISOVUE-300 IOPAMIDOL (ISOVUE-300) INJECTION 61% COMPARISON:  CT 06/20/2012 FINDINGS: Lower chest: Lung bases are clear. Hepatobiliary: No focal hepatic lesion. Low attenuation of liver parenchyma. Postcholecystectomy. No biliary dilatation. Pancreas: Pancreas is normal. No ductal dilatation. No pancreatic inflammation. Spleen: Normal spleen Adrenals/urinary tract: Bilateral adrenal adenomas increased moderately in size. Normal renal parenchyma. Ureters bladder normal. Stomach/Bowel: Stomach, small bowel, appendix, and cecum are normal. The colon and rectosigmoid colon are normal. Sigmoid colon is tortuous extending into the RIGHT lower quadrant. Vascular/Lymphatic: Abdominal aorta is normal caliber with atherosclerotic calcification. There is no retroperitoneal or periportal lymphadenopathy. No pelvic lymphadenopathy. Reproductive: Uterus and ovaries normal.  IUD in  expected location. Other: No free fluid. Musculoskeletal: No aggressive osseous lesion. IMPRESSION: 1. No acute abdominopelvic findings. 2. Low-attenuation liver suggests hepatic steatosis. 3. Postcholecystectomy. 4. No bowel abnormality. 5. Normal appendix. 6. Mild atherosclerotic calcification aorta. Electronically Signed   By: StSuzy Bouchard.D.   On: 08/22/2017 12:41   Dg Chest Port 1 View  Result Date: 09/15/2017 CLINICAL DATA:  Chest pain with shortness of breath EXAM: PORTABLE CHEST 1 VIEW COMPARISON:  November 21, 2009 FINDINGS: Lungs are clear. Heart size and pulmonary vascularity are normal. No adenopathy. No pneumothorax. No bone lesions. IMPRESSION: No edema or consolidation. Electronically Signed   By: WiLowella GripII M.D.   On: 09/15/2017 11:36    ECG & Cardiac Imaging    EKG: SR with acute ST elevation in inferior leads.   Assessment & Plan    [age]o female with PMH of IDA, B12 deficiency, Celiac disease [facility] to [facility]   Acute inferior STEMI: Presented with chest pain to [facility] with chest pain and EKG showed acute ST elevation in the inferior leads. Code STEMI was called and [facility] prior to arrival. Taken directly to the cath lab to undergo cath with Dr. VarIrish Lackurther recommendations post cath.    Severity of Illness: The appropriate patient status for this patient is INPATIENT. Inpatient status is judged to be reasonable and necessary in order to provide the required intensity of service to ensure the patient's safety. The patient's presenting symptoms, physical exam findings, and initial radiographic and laboratory data in the context of their chronic comorbidities is felt to place them at high risk for further clinical deterioration. Furthermore, it is not anticipated that the patient will be medically stable for discharge from the  hospital within 2 midnights of admission. The following factors support the patient status of inpatient.   " The patient's presenting symptoms include chest pain. " The worrisome physical exam findings include normal exam. " The initial radiographic and laboratory data are worrisome because of EKG with inferior STEMI. " The chronic co-morbidities include IDA.   * I certify that at the  point of admission it is my clinical judgment that the patient will require inpatient hospital care spanning beyond 2 midnights from the point of admission due to high intensity of service, high risk for further deterioration and high frequency of surveillance required.*    Signed, Reino Bellis, NP-C Pager [phone] 09/15/2017, 2:02 PM   I have examined the patient and reviewed assessment and plan and discussed with patient.  Agree with above as stated.  I reviewed the ECG and made the decision for emergency cath.  She had a thrombotic lesion in the RCA.  THis was successfully stented.  She has quit smoking.  SHe needs DAPT, statin and regular exercise along with aggressive secondary prevention.    Larae Grooms

## 2017-09-15 NOTE — ED Provider Notes (Signed)
[facility] EMERGENCY DEPARTMENT Provider Note   CSN: [id number] Arrival date & time: 09/15/17  1101     History   Chief Complaint Chief Complaint  Patient presents with  . Chest Pain    HPI Sharon Lawrence is a 52 y.o. female.  HPI   52 year old female with chest pain.  Symptom onset while at rest approximately 1 hour prior to arrival.  She describes a squeezing sensation that waxes and wanes in the center of her chest.  Associated with a clammy feeling and nausea.  Symptoms have since improved.  She has no known cardiac history.  She reports a strong family history though.  Past Medical History:  Diagnosis Date  . B12 deficiency 02/07/2011  . Celiac disease   . Folic acid deficiency 85/0/2774  . Iron deficiency anemia 02/07/2011    Patient Active Problem List   Diagnosis Date Noted  . Family hx of colon cancer 09/11/2017  . Non-intractable vomiting with nausea 09/11/2017  . Celiac disease 09/11/2017  . Vaginal condyloma 06/22/2013  . Obesity 06/22/2013  . Fatty liver disease, nonalcoholic 12/87/8676  . Folic acid deficiency 72/11/4707  . Iron deficiency 02/07/2011  . B12 deficiency 02/07/2011    Past Surgical History:  Procedure Laterality Date  . CHOLECYSTECTOMY    . KNEE CARTILAGE SURGERY Right   . TONSILLECTOMY       OB History    Gravida  3   Para  3   Term      Preterm  3   AB      Living  3     SAB      TAB      Ectopic      Multiple      Live Births  3            Home Medications    Prior to Admission medications   Medication Sig Start Date End Date Taking? Authorizing Provider  cholecalciferol (VITAMIN D) 1000 units tablet Take 5,000 Units by mouth daily.    [provider]  Cyanocobalamin (VITAMIN B-12 IJ) Inject as directed.    [provider]  gabapentin (NEURONTIN) 300 MG capsule Take 900 mg by mouth at bedtime.    [provider]  hydrochlorothiazide (HYDRODIURIL) 50 MG tablet Take 1 tablet by  mouth daily. 06/11/17   [provider]  metoCLOPramide (REGLAN) 5 MG tablet Take 1 tablet by mouth 3 (three) times daily as needed. 07/30/17   [provider]  ondansetron (ZOFRAN-ODT) 8 MG disintegrating tablet Take 1 tablet by mouth 3 (three) times daily as needed. 08/11/17   [provider]  promethazine (PHENERGAN) 25 MG tablet Take 1 tablet by mouth 3 (three) times daily as needed. 08/11/17   [provider]  ranitidine (ZANTAC) 150 MG tablet Take 1 tablet by mouth daily. 07/30/17   [provider]  sucralfate (CARAFATE) 1 g tablet Take 1 tablet by mouth 2 (two) times daily as needed. 07/30/17   [provider]    Family History Family History  Problem Relation Age of Onset  . Cancer Mother   . Hypertension Mother   . Hypertension Father   . Hypothyroidism Father   . Cancer Sister   . CAD Maternal Grandfather   . Cirrhosis Paternal Grandfather   . CAD Paternal Grandmother   . Diabetes Paternal Grandmother   . Hypothyroidism Paternal Grandmother     Social History Social History   Tobacco Use  . Smoking status:  Current Every Day Smoker    Packs/day: 0.25    Years: 12.00    Pack years: 3.00    Types: Cigarettes  . Smokeless tobacco: Never Used  Substance Use Topics  . Alcohol use: No  . Drug use: No     Allergies   Erythromycin; Neosporin original [bacitracin-neomycin-polymyxin]; Sulfa antibiotics; Tobrex [tobramycin]; Tramadol; and Penicillins   Review of Systems Review of Systems  All systems reviewed and negative, other than as noted in HPI.  Physical Exam Updated Vital Signs BP (!) 150/91 (BP Location: Left Arm)   Pulse 88   Temp 98.2 F (36.8 C) (Oral)   Resp (!) 23   Ht 5' (1.524 m)   Wt 92.1 kg (203 lb)   SpO2 100%   BMI 39.65 kg/m   Physical Exam  Constitutional: She appears well-developed and well-nourished. No distress.  HENT:  Head: Normocephalic and atraumatic.  Eyes: Conjunctivae are  normal. Right eye exhibits no discharge. Left eye exhibits no discharge.  Neck: Neck supple.  Cardiovascular: Normal rate, regular rhythm and normal heart sounds. Exam reveals no gallop and no friction rub.  No murmur heard. Pulmonary/Chest: Effort normal and breath sounds normal. No respiratory distress.  Abdominal: Soft. She exhibits no distension. There is no tenderness.  Musculoskeletal: She exhibits no edema or tenderness.  Neurological: She is alert.  Skin: Skin is warm and dry.  Psychiatric: She has a normal mood and affect. Her behavior is normal. Thought content normal.  Nursing note and vitals reviewed.    ED Treatments / Results  Labs (all labs ordered are listed, but only abnormal results are displayed) Labs Reviewed  BASIC METABOLIC PANEL  CBC  I-STAT TROPONIN, ED  I-STAT BETA HCG BLOOD, ED ([facility], [facility], [facility] ONLY)    EKG EKG Interpretation  Date/Time:  Monday September 15 2017 11:19:27 EDT Ventricular Rate:  110 PR Interval:    QRS Duration: 93 QT Interval:  322 QTC Calculation: 436 R Axis:   83 Text Interpretation:  Sinus tachycardia Inferior infarct, acute (RCA) Probable RV involvement, suggest recording right precordial leads Mild lateral ST depression EKG changes new from previous.  Confirmed by Virgel Manifold [phone]) on 09/15/2017 11:28:49 AM   Radiology No results found.  Procedures Procedures (including critical care time)  CRITICAL CARE Performed by: Virgel Manifold Total critical care time: 35 minutes Critical care time was exclusive of separately billable procedures and treating other patients. Critical care was necessary to treat or prevent imminent or life-threatening deterioration. Critical care was time spent personally by me on the following activities: development of treatment plan with patient and/or surrogate as well as nursing, discussions with consultants, evaluation of patient's response to treatment, examination of patient, obtaining history from  patient or surrogate, ordering and performing treatments and interventions, ordering and review of laboratory studies, ordering and review of radiographic studies, pulse oximetry and re-evaluation of patient's condition.   Medications Ordered in ED Medications  nitroGLYCERIN (NITROSTAT) SL tablet 0.4 mg (has no administration in time range)  heparin 100-0.45 UNIT/ML-% infusion (has no administration in time range)  aspirin chewable tablet 324 mg (324 mg Oral Given 09/15/17 1120)  heparin ADULT infusion 100 units/mL (25000 units/242m sodium chloride 0.45%) (1,000 Units/hr Intravenous New Bag/Given 09/15/17 1123)     Initial Impression / Assessment and Plan / ED Course  I have reviewed the triage vital signs and the nursing notes.  Pertinent labs & imaging results that were available during my care of the patient were reviewed by  me and considered in my medical decision making (see chart for details).    52 year old female with chest pain.  Symptoms and EKG are concerning for an inferior STEMI.  Code STEMI was called.  Aspirin.  Heparin.  Discussed with Dr. Ellyn Hack, cardiology.  Emergent transfer to [facility] hospital.   Final Clinical Impressions(s) / ED Diagnoses   Final diagnoses:  ST elevation myocardial infarction involving right coronary artery [facility])    ED Discharge Orders    None       Virgel Manifold, MD 09/15/17 1129

## 2017-09-15 NOTE — ED Notes (Signed)
Code Stemi called to [facility] at about 1113 as ordered by Dr. Stark Jock.  [facility] on standby but latter cancelled, due to [place] on the way to get the Pt.

## 2017-09-15 NOTE — Progress Notes (Signed)
Pt with large amounts brown diarrhea type 7 stools. Pt states, " I have been unable to eat and this has been going on for 10 weeks." Pt has been and currently has nausea, diarrhea. Pt states, " I am celiac, I cheat every once in a while, but this has been going on for 10 weeks. I feel weak." MD notified, will continue to monitor.  Lucius Conn, RN

## 2017-09-16 DIAGNOSIS — R110 Nausea: Secondary | ICD-10-CM

## 2017-09-16 LAB — BASIC METABOLIC PANEL
Anion gap: 7 (ref 5–15)
BUN: 9 mg/dL (ref 6–20)
CO2: 23 mmol/L (ref 22–32)
Calcium: 7.9 mg/dL — ABNORMAL LOW (ref 8.9–10.3)
Chloride: 111 mmol/L (ref 98–111)
Creatinine, Ser: 0.76 mg/dL (ref 0.44–1.00)
GFR calc Af Amer: >60 mL/min (ref >60)
GFR calc non Af Amer: >60 mL/min (ref >60)
Glucose, Bld: 87 mg/dL (ref 70–99)
Potassium: 4 mmol/L (ref 3.5–5.1)
Sodium: 141 mmol/L (ref 135–145)

## 2017-09-16 LAB — LIPID PANEL
Cholesterol: 77 mg/dL (ref 0–200)
HDL: 19 mg/dL — ABNORMAL LOW (ref >40)
LDL Cholesterol: 45 mg/dL (ref 0–99)
Total CHOL/HDL Ratio: 4.1 RATIO
Triglycerides: 64 mg/dL (ref <150)
VLDL: 13 mg/dL (ref 0–40)

## 2017-09-16 LAB — CBC
HCT: 38 % (ref 36.0–46.0)
Hemoglobin: 12.2 g/dL (ref 12.0–15.0)
MCH: 24.6 pg — ABNORMAL LOW (ref 26.0–34.0)
MCHC: 32.1 g/dL (ref 30.0–36.0)
MCV: 76.8 fL — ABNORMAL LOW (ref 78.0–100.0)
Platelets: 515 10*3/uL — ABNORMAL HIGH (ref 150–400)
RBC: 4.95 MIL/uL (ref 3.87–5.11)
RDW: 18 % — ABNORMAL HIGH (ref 11.5–15.5)
WBC: 11 10*3/uL — ABNORMAL HIGH (ref 4.0–10.5)

## 2017-09-16 LAB — TROPONIN I: Troponin I: 0.75 ng/mL (ref <0.03)

## 2017-09-16 NOTE — Care Management Note (Signed)
Case Management Note [name] RN,BSN Unit [facility] 1-22 Case Manager  [phone]  Patient Details  Name: Sharon Lawrence MRN: [id number] Date of Birth: [birth year]  Subjective/Objective:   Pt admitted with STEMI s/p stenting                 Action/Plan: PTA pt lived at home, independent with mobility and ADLs- anticipate return home. Insurance check completed on Brilinta- copay cost $60- coverage info shared with pt at the bedside and copay assist card give to pt to use on discharge- per pt she uses [facility] in [place]- call made to pharmacy and drug is in stock to fill.   Expected Discharge Date:                  Expected Discharge Plan:  Home/Self Care  In-House Referral:  NA  Discharge planning Services  CM Consult, Medication Assistance  Post Acute Care Choice:  NA Choice offered to:  NA  DME Arranged:    DME Agency:     HH Arranged:    HH Agency:     Status of Service:  Completed, signed off  If discussed at [employer] of Stay Meetings, dates discussed:    Additional Comments:  [name], RN 09/16/2017, 4:13 PM

## 2017-09-16 NOTE — Progress Notes (Signed)
Progress Note  Patient Name: Sharon Lawrence Date of Encounter: 09/16/2017  Primary Cardiologist: No primary care provider on file.   Subjective   No chest pain.  She still has nausea.  Inpatient Medications    Scheduled Meds: . aspirin  81 mg Oral Daily  . atorvastatin  80 mg Oral q1800  . famotidine  10 mg Oral QHS  . gabapentin  900 mg Oral QHS  . metoprolol tartrate  12.5 mg Oral BID  . sodium chloride flush  3 mL Intravenous Q12H  . ticagrelor  90 mg Oral BID   Continuous Infusions: . sodium chloride     PRN Meds: sodium chloride, acetaminophen, loperamide, nitroGLYCERIN, promethazine, sodium chloride flush, sucralfate   Vital Signs    Vitals:   09/16/17 1100 09/16/17 1200 09/16/17 1300 09/16/17 1400  BP: (!) 132/100 124/74    Pulse: 82 86 81 72  Resp: (!) 21 16 14 20   Temp:      TempSrc:      SpO2: 97% 99% 97% 96%  Weight:      Height:        Intake/Output Summary (Last 24 hours) at 09/16/2017 1407 Last data filed at 09/16/2017 1200 Gross per 24 hour  Intake 469.22 ml  Output 2200 ml  Net -1730.78 ml   Filed Weights   09/15/17 1108 09/15/17 1346  Weight: 203 lb (92.1 kg) 211 lb 13.8 oz (96.1 kg)    Telemetry    Normal sinus rhythm- Personally Reviewed  ECG    Normal sinus rhythm, nonspecific ST segment changes- Personally Reviewed  Physical Exam   GEN: No acute distress.   Neck: No JVD Cardiac: RRR, no murmurs, rubs, or gallops.  Respiratory: Clear to auscultation bilaterally. GI: Soft, nontender, non-distended  MS: No edema; No deformity.  Right radial site without hematoma Neuro:  Nonfocal  Psych: Normal affect   Labs    Chemistry Recent Labs  Lab 09/15/17 1132 09/15/17 1238 09/16/17 0358  NA 138 138 141  K 3.9 4.2 4.0  CL 103 105 111  CO2 26  --  23  GLUCOSE 123* 97 87  BUN 12 9 9   CREATININE 0.93 0.80 0.76  CALCIUM 8.3*  --  7.9*  GFRNONAA >60  --  >60  GFRAA >60  --  >60  ANIONGAP 9  --  7     Hematology Recent Labs   Lab 09/15/17 1132 09/15/17 1238 09/16/17 0358  WBC 12.8*  --  11.0*  RBC 5.66*  --  4.95  HGB 14.4 14.3 12.2  HCT 44.0 42.0 38.0  MCV 77.7*  --  76.8*  MCH 25.4*  --  24.6*  MCHC 32.7  --  32.1  RDW 17.9*  --  18.0*  PLT 562*  --  515*    Cardiac Enzymes Recent Labs  Lab 09/15/17 1642 09/15/17 2141 09/16/17 0358  TROPONINI 0.76* 0.98* 0.75*    Recent Labs  Lab 09/15/17 1116  TROPIPOC 0.00     BNPNo results for input(s): BNP, PROBNP in the last 168 hours.   DDimer No results for input(s): DDIMER in the last 168 hours.   Radiology    Dg Chest Port 1 View  Result Date: 09/15/2017 CLINICAL DATA:  Chest pain with shortness of breath EXAM: PORTABLE CHEST 1 VIEW COMPARISON:  November 21, 2009 FINDINGS: Lungs are clear. Heart size and pulmonary vascularity are normal. No adenopathy. No pneumothorax. No bone lesions. IMPRESSION: No edema or consolidation. Electronically Signed  By: Lowella Grip III M.D.   On: 09/15/2017 11:36    Cardiac Studies   Catheter results reviewed  Patient Profile     52 y.o. female status post inferior MI  Assessment & Plan    1) cardiac: Status post inferior MI.  Continue dual antiplatelet therapy along with aggressive secondary prevention including high-dose statin.  Minimal troponin elevation. Stable from a cardiac standpoint.  Continue low-dose beta-blocker.  Will increase metoprolol to 25 mg twice a day.  2) GI: She has issues with celiac disease.  She has endoscopy scheduled for August in [place].  We discussed whether or not she wanted to see a GI doctor here in [place].  I do not think they would do any type of invasive procedure at this time given that she just had an MI and is now on dual antiplatelet therapy.  We went over certain dietary changes and she is aware of foods that need to be avoided to help keep her celiac disease under control.  For questions or updates, please contact [place] Please consult  [email or web address] for contact info under Cardiology/STEMI.      Signed, Larae Grooms, MD  09/16/2017, 2:07 PM

## 2017-09-16 NOTE — Progress Notes (Signed)
Per insurance check on Brilinta  Co-pay amount : for Brilinta 90 mg bid $60.00  No PA required  Pharmacies: [facility].

## 2017-09-16 NOTE — Progress Notes (Signed)
CARDIAC REHAB PHASE I   PRE:  Rate/Rhythm: 80 SR  BP:  Supine: 142/90  Sitting:   Standing:    SaO2: 99%RA  MODE:  Ambulation: 370 ft   POST:  Rate/Rhythm: 102 ST  BP:  Supine:   Sitting: 138/83  Standing:    SaO2: 98%RA 1130-1206 Pt walked 370 ft on RA with steady gait and no CP. Tolerated well. MI education completed with pt. 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 Gave ex ed, heart healthy diet and stressed importance of brilinta with stent. Needs to see case manager. Discussed CRP 2 and referred to [place]. Pt stated she knew how to take NTG. Reviewed MI restrictions. Pt stated she quit smoking in February. Congratulated her on this.   Graylon Good, RN BSN  09/16/2017 12:04 PM

## 2017-09-17 DIAGNOSIS — E785 Hyperlipidemia, unspecified: Secondary | ICD-10-CM

## 2017-09-17 DIAGNOSIS — I2119 ST elevation (STEMI) myocardial infarction involving other coronary artery of inferior wall: Secondary | ICD-10-CM

## 2017-09-17 DIAGNOSIS — E782 Mixed hyperlipidemia: Secondary | ICD-10-CM

## 2017-09-17 DIAGNOSIS — Z720 Tobacco use: Secondary | ICD-10-CM

## 2017-09-17 DIAGNOSIS — K900 Celiac disease: Secondary | ICD-10-CM

## 2017-09-17 LAB — HEMOGLOBIN A1C
Hgb A1c MFr Bld: 5.6 % (ref 4.8–5.6)
Mean Plasma Glucose: 114 mg/dL

## 2017-09-17 MED ORDER — METOPROLOL TARTRATE 25 MG PO TABS: 25.0000 mg | ORAL_TABLET | Freq: Two times a day (BID) | ORAL | 2 refills | Status: DC

## 2017-09-17 MED ORDER — NITROGLYCERIN 0.4 MG SL SUBL: 0.4000 mg | SUBLINGUAL_TABLET | SUBLINGUAL | 2 refills | Status: DC | PRN

## 2017-09-17 MED ORDER — ASPIRIN 81 MG PO CHEW: 81.0000 mg | CHEWABLE_TABLET | Freq: Every day | ORAL | Status: DC

## 2017-09-17 MED ORDER — ATORVASTATIN CALCIUM 80 MG PO TABS: 80.0000 mg | ORAL_TABLET | Freq: Every day | ORAL | 1 refills | Status: DC

## 2017-09-17 MED ORDER — METOPROLOL TARTRATE 25 MG PO TABS
25.0000 mg | ORAL_TABLET | Freq: Two times a day (BID) | ORAL | Status: DC
  Administered 2017-09-17: 25 mg via ORAL
  Filled 2017-09-17: qty 1

## 2017-09-17 MED ORDER — TICAGRELOR 90 MG PO TABS: 90.0000 mg | ORAL_TABLET | Freq: Two times a day (BID) | ORAL | 2 refills | Status: DC

## 2017-09-17 NOTE — Discharge Summary (Addendum)
Discharge Summary    Patient ID: Sharon Lawrence,  MRN: [id number], DOB/AGE: December 09, 1965 52 y.o.  Admit date: 09/15/2017 Discharge date: 09/17/2017  Primary Care Provider: Joyice Faster Primary Cardiologist: Dr. Irish Lack   Discharge Diagnoses    Active Problems:   Acute ST elevation myocardial infarction (STEMI) of inferior wall (HCC)   Celiac disease   Hyperlipidemia   Tobacco use   Allergies Allergies  Allergen Reactions  . Erythromycin Anaphylaxis  . Neosporin Original [Bacitracin-Neomycin-Polymyxin] Swelling    Swelling at site of application  . Sulfa Antibiotics Swelling    Sulfa eye drops caused eyes to swell  . Tobrex [Tobramycin] Swelling  . Tramadol Hives  . Penicillins Rash    Has patient had a PCN reaction causing immediate rash, facial/tongue/throat swelling, SOB or lightheadedness with hypotension: Yes Has patient had a PCN reaction causing severe rash involving mucus membranes or skin necrosis: No Has patient had a PCN reaction that required hospitalization: No Has patient had a PCN reaction occurring within the last 10 years: No If all of the above answers are "NO", then may proceed with Cephalosporin use.     Diagnostic Studies/Procedures    Cath: 09/15/17   Prox RCA lesion is 80% stenosed. It appeared to be a ruptured plaque.  A drug-eluting stent was successfully placed using a STENT SYNERGY DES 4X24.  Post intervention, there is a 0% residual stenosis.  The left ventricular systolic function is normal.  LV end diastolic pressure is low.  The left ventricular ejection fraction is 55-65% by visual estimate.  Severe spasm in the right radial artery, requiring heavy sedation and IA NTG. 4Fr diagnostic and 5 Fr Guide catheter used.   Recommend uninterrupted dual antiplatelet therapy with Aspirin 9m daily and Ticagrelor 963mtwice daily for a minimum of 12 months (ACS - Class I recommendation).   Start high dose statin. Check A1C.  Continue  smoking cessation.  _____________   History of Present Illness     Sharon Lawrence is a [age]o female with PMH of  IDA, B12 deficiency, and Celiac disease. Reports being followed by a PCP but never had any cardiac issues in the past. Family hx of CAD in maternal grandfather and mother. Recently saw GI about n/v/d. She is not a smoker. Reports being at work today when she experienced a sudden onset of centralized chest pain around 10am. Pain increased and she had a co-worker take her to the ED at [facility]. EKG there showed acute ST elevation in the inferior leads and Code STEMI was called. She was given 32477mSA and heparin. [facility] called to transport to [facility] emergent cardiac cath. Reports being pain free at the time of arrival.   Hospital Course     [facility] cardiac [facility] noted above [facility] be related to rupt[facility]. EF noted at 55-65% via LV gram. Plan for DAPT with ASA/Brilinta for at least one year. LDL 45, and placed on high dose statin given acute STEMI. Hgb A1c 5.6. No recurrent chest pain. Worked well with cardiac rehab. Of note, does have known celiac disease and was scheduled for endoscopy in August. It was discussed that this would need to be postponed given her MI and need for DAPT.   General: Well developed, well nourished, female appearing in no acute distress. Head: Normocephalic, atraumatic.  Neck: Supple without bruits, JVD. Lungs:  Resp regular and unlabored, CTA. Heart: RRR, S1, S2, no S3, S4,  or murmur; no rub. Abdomen: Soft, non-tender, non-distended with normoactive bowel sounds.  Extremities: No clubbing, cyanosis, edema. Distal pedal pulses are 2+ bilaterally. R radial cath site stable with mild bruising but no hematoma Neuro: Alert and oriented X 3. Moves all extremities spontaneously. Psych: Normal affect.  Sharon Lawrence was seen by Dr. Irish Lack and determined stable for discharge home. Follow up in the office has been arranged. Medications  are listed below.   _____________  Discharge Vitals Blood pressure (!) 149/82, pulse 84, temperature 97.7 F (36.5 C), temperature source Oral, resp. rate (!) 29, height 5' (1.524 m), weight 211 lb 13.8 oz (96.1 kg), SpO2 96 %.  Filed Weights   09/15/17 1108 09/15/17 1346  Weight: 203 lb (92.1 kg) 211 lb 13.8 oz (96.1 kg)    Labs & Radiologic Studies    CBC Recent Labs    09/15/17 1132 09/15/17 1238 09/16/17 0358  WBC 12.8*  --  11.0*  HGB 14.4 14.3 12.2  HCT 44.0 42.0 38.0  MCV 77.7*  --  76.8*  PLT 562*  --  449*   Basic Metabolic Panel Recent Labs    09/15/17 1132 09/15/17 1238 09/16/17 0358  NA 138 138 141  K 3.9 4.2 4.0  CL 103 105 111  CO2 26  --  23  GLUCOSE 123* 97 87  BUN 12 9 9   CREATININE 0.93 0.80 0.76  CALCIUM 8.3*  --  7.9*   Liver Function Tests No results for input(s): AST, ALT, ALKPHOS, BILITOT, PROT, ALBUMIN in the last 72 hours. No results for input(s): LIPASE, AMYLASE in the last 72 hours. Cardiac Enzymes Recent Labs    09/15/17 1642 09/15/17 2141 09/16/17 0358  TROPONINI 0.76* 0.98* 0.75*   BNP Invalid input(s): POCBNP D-Dimer No results for input(s): DDIMER in the last 72 hours. Hemoglobin A1C Recent Labs    09/16/17 0358  HGBA1C 5.6   Fasting Lipid Panel Recent Labs    09/16/17 0358  CHOL 77  HDL 19*  LDLCALC 45  TRIG 64  CHOLHDL 4.1   Thyroid Function Tests No results for input(s): TSH, T4TOTAL, T3FREE, THYROIDAB in the last 72 hours.  Invalid input(s): FREET3 _____________  Ct Abdomen Pelvis W Contrast  Result Date: 08/22/2017 CLINICAL DATA:  Abdominal pain. Nausea and vomiting for several days. EXAM: CT ABDOMEN AND PELVIS WITH CONTRAST TECHNIQUE: Multidetector CT imaging of the abdomen and pelvis was performed using the standard protocol following bolus administration of intravenous contrast. CONTRAST:  182m ISOVUE-300 IOPAMIDOL (ISOVUE-300) INJECTION 61%, 323mISOVUE-300 IOPAMIDOL (ISOVUE-300) INJECTION 61%  COMPARISON:  CT 06/20/2012 FINDINGS: Lower chest: Lung bases are clear. Hepatobiliary: No focal hepatic lesion. Low attenuation of liver parenchyma. Postcholecystectomy. No biliary dilatation. Pancreas: Pancreas is normal. No ductal dilatation. No pancreatic inflammation. Spleen: Normal spleen Adrenals/urinary tract: Bilateral adrenal adenomas increased moderately in size. Normal renal parenchyma. Ureters bladder normal. Stomach/Bowel: Stomach, small bowel, appendix, and cecum are normal. The colon and rectosigmoid colon are normal. Sigmoid colon is tortuous extending into the RIGHT lower quadrant. Vascular/Lymphatic: Abdominal aorta is normal caliber with atherosclerotic calcification. There is no retroperitoneal or periportal lymphadenopathy. No pelvic lymphadenopathy. Reproductive: Uterus and ovaries normal.  IUD in expected location. Other: No free fluid. Musculoskeletal: No aggressive osseous lesion. IMPRESSION: 1. No acute abdominopelvic findings. 2. Low-attenuation liver suggests hepatic steatosis. 3. Postcholecystectomy. 4. No bowel abnormality. 5. Normal appendix. 6. Mild atherosclerotic calcification aorta. Electronically Signed   By: StSuzy Bouchard.D.   On: 08/22/2017 12:41   Dg  Chest Port 1 View  Result Date: 09/15/2017 CLINICAL DATA:  Chest pain with shortness of breath EXAM: PORTABLE CHEST 1 VIEW COMPARISON:  November 21, 2009 FINDINGS: Lungs are clear. Heart size and pulmonary vascularity are normal. No adenopathy. No pneumothorax. No bone lesions. IMPRESSION: No edema or consolidation. Electronically Signed   By: Lowella Grip III M.D.   On: 09/15/2017 11:36   Disposition   Pt is being discharged home today in good condition.  Follow-up Plans & Appointments    Follow-up Information    Erma Heritage, PA-C Follow up on 10/10/2017.   Specialties:  Physician Assistant, Cardiology Why:  at 1pm for your follow up appt.  Contact information: [place]  [phone]          Discharge Instructions    AMB Referral to Cardiac Rehabilitation - Phase II   Complete by:  As directed    Diagnosis:   STEMI Coronary Stents     Amb Referral to Cardiac Rehabilitation   Complete by:  As directed    Diagnosis:   STEMI Coronary Stents     Call MD for:  redness, tenderness, or signs of infection (pain, swelling, redness, odor or green/yellow discharge around incision site)   Complete by:  As directed    Diet - low sodium heart healthy   Complete by:  As directed    Discharge instructions   Complete by:  As directed    Radial Site Care Refer to this sheet in the next few weeks. These instructions provide you with information on caring for yourself after your procedure. Your caregiver may also give you more specific instructions. Your treatment has been planned according to current medical practices, but problems sometimes occur. Call your caregiver if you have any problems or questions after your procedure. HOME CARE INSTRUCTIONS You may shower the day after the procedure.Remove the bandage (dressing) and gently wash the site with plain soap and water.Gently pat the site dry.  Do not apply powder or lotion to the site.  Do not submerge the affected site in water for 3 to 5 days.  Inspect the site at least twice daily.  Do not flex or bend the affected arm for 24 hours.  No lifting over 5 pounds (2.3 kg) for 5 days after your procedure.  Do not drive home if you are discharged the same day of the procedure. Have someone else drive you.  You may drive 24 hours after the procedure unless otherwise instructed by your caregiver.  What to expect: Any bruising will usually fade within 1 to 2 weeks.  Blood that collects in the tissue (hematoma) may be painful to the touch. It should usually decrease in size and tenderness within 1 to 2 weeks.  SEEK IMMEDIATE MEDICAL CARE IF: You have unusual pain at the radial site.  You have redness,  warmth, swelling, or pain at the radial site.  You have drainage (other than a small amount of blood on the dressing).  You have chills.  You have a fever or persistent symptoms for more than 72 hours.  You have a fever and your symptoms suddenly get worse.  Your arm becomes pale, cool, tingly, or numb.  You have heavy bleeding from the site. Hold pressure on the site.   PLEASE DO NOT MISS ANY DOSES OF YOUR BRILINTA!!!!! Also keep a log of you blood pressures and bring back to your follow up appt. Please call the office with any  questions.   Patients taking blood thinners should generally stay away from medicines like ibuprofen, Advil, Motrin, naproxen, and Aleve due to risk of stomach bleeding. You may take Tylenol as directed or talk to your primary doctor about alternatives.   Increase activity slowly   Complete by:  As directed        Discharge Medications     Medication List    STOP taking these medications   hydrochlorothiazide 50 MG tablet Commonly known as:  HYDRODIURIL     TAKE these medications   aspirin 81 MG chewable tablet Chew 1 tablet (81 mg total) by mouth daily. Start taking on:  09/18/2017   atorvastatin 80 MG tablet Commonly known as:  LIPITOR Take 1 tablet (80 mg total) by mouth daily at 6 PM.   cholecalciferol 1000 units tablet Commonly known as:  VITAMIN D Take 5,000 Units by mouth daily.   gabapentin 300 MG capsule Commonly known as:  NEURONTIN Take 900 mg by mouth at bedtime.   Melatonin 10 MG Tabs Take 50 mg by mouth at bedtime.   metoprolol tartrate 25 MG tablet Commonly known as:  LOPRESSOR Take 1 tablet (25 mg total) by mouth 2 (two) times daily.   nitroGLYCERIN 0.4 MG SL tablet Commonly known as:  NITROSTAT Place 1 tablet (0.4 mg total) under the tongue every 5 (five) minutes as needed for chest pain.   ondansetron 8 MG disintegrating tablet Commonly known as:  ZOFRAN-ODT Take 1 tablet by mouth 3 (three) times daily as needed for  nausea.   oxyCODONE-acetaminophen 5-325 MG tablet Commonly known as:  PERCOCET/ROXICET Take 1 tablet by mouth every 6 (six) hours as needed for severe pain.   promethazine 25 MG tablet Commonly known as:  PHENERGAN Take 1 tablet by mouth 3 (three) times daily as needed for nausea.   ranitidine 150 MG tablet Commonly known as:  ZANTAC Take 1 tablet by mouth daily.   sucralfate 1 g tablet Commonly known as:  CARAFATE Take 1 tablet by mouth 2 (two) times daily as needed (upset stomach).   ticagrelor 90 MG Tabs tablet Commonly known as:  BRILINTA Take 1 tablet (90 mg total) by mouth 2 (two) times daily.   VITAMIN B-12 IJ Inject as directed every 30 (thirty) days.        Acute coronary syndrome (MI, NSTEMI, STEMI, etc) this admission?: Yes.     AHA/ACC Clinical Performance & Quality Measures: 1. Aspirin prescribed? - Yes 2. ADP Receptor Inhibitor (Plavix/Clopidogrel, Brilinta/Ticagrelor or Effient/Prasugrel) prescribed (includes medically managed patients)? - Yes 3. Beta Blocker prescribed? - Yes 4. High Intensity Statin (Lipitor 40-21m or Crestor 20-460m prescribed? - Yes 5. EF assessed during THIS hospitalization? - Yes 6. For EF <40%, was ACEI/ARB prescribed? - Not Applicable (EF >/= 4040%7. For EF <40%, Aldosterone Antagonist (Spironolactone or Eplerenone) prescribed? - Not Applicable (EF >/= 4034%8. Cardiac Rehab Phase II ordered (Included Medically managed Patients)? - Yes    Outstanding Labs/Studies   FLP/LFTs in 6 weeks if tolerating statin.  Duration of Discharge Encounter   Greater than 30 minutes including physician time.  Signed, LiReino BellisP-C 09/17/2017, 9:53 AM\  I have examined the patient and reviewed assessment and plan and discussed with patient.  Agree with above as stated.   GEN: Well nourished, well developed, in no acute distress  HEENT: normal  Neck: no JVD, carotid bruits, or masses Cardiac: RRR; no murmurs, rubs, or gallops,no edema   Respiratory:  clear to auscultation bilaterally,  normal work of breathing GI: soft, nontender, nondistended,  MS: no deformity or atrophy ; bruising noted at the right wrist.  2+ right radial pulse Skin: warm and dry, no rash Neuro:  Strength and sensation are intact Psych: euthymic mood, full affect  I stressed the importance of dual antiplatelet therapy.  She will be on high-dose statin therapy.  She has already stopped smoking.  She should continue with a healthy diet.  Lipid-lowering therapy will be important as well.  I recommended cardiac rehab.  She will follow-up in [place].  She will also follow-up with her GI doctor in [place], for her celiac disease.   Larae Grooms

## 2017-09-17 NOTE — Discharge Instructions (Signed)
Acute Coronary Syndrome °Acute coronary syndrome (ACS) is a serious problem in which there is suddenly not enough blood and oxygen reaching the heart. ACS can result in chest pain or a heart attack. °What are the causes? °This condition may be caused by: °· A buildup of fat and cholesterol inside of the arteries (atherosclerosis). This is the most common cause. The buildup (plaque) can cause the blood vessels in your heart (coronary arteries) to become narrow or blocked. Plaque can also break off to form a clot. °· A coronary spasm. °· A tearing of the coronary artery (spontaneous coronary artery dissection). °· Low blood pressure (hypotension). °· An abnormal heart beat (arrhythmia). °· Using cocaine or methamphetamine. ° °What increases the risk? °The following factors may make you more likely to develop this condition: °· Age. °· History of chest pain, heart attack, or stroke. °· Being female. °· Family history of chest pain, heart disease, or stroke. °· Smoking. °· Inactivity. °· Being overweight. °· High cholesterol. °· High blood pressure (hypertension). °· Diabetes. °· Excessive alcohol use. ° °What are the signs or symptoms? °Common symptoms of this condition include: °· Chest pain. The pain may last long, or may stop and come back (recur). It may feel like: °? Crushing or squeezing. °? Tightness, pressure, fullness, or heaviness. °· Arm, neck, jaw, or back pain. °· Heartburn or indigestion. °· Shortness of breath. °· Nausea. °· Sudden cold sweats. °· Lightheadedness. °· Dizziness. °· Tiredness (fatigue). ° °Sometimes there are no symptoms. °How is this diagnosed? °This condition may be diagnosed through: °· An electrocardiogram (ECG). This test records the impulses of the heart. °· Blood tests. °· A CT scan of the chest. °· A coronary angiogram. This procedure checks for a blockage in the coronary arteries. ° °How is this treated? °Treatment for this condition may include: °· Oxygen. °· Medicines, such  as: °? Antiplatelet medicines and blood-thinning medicines, such as aspirin. These help prevent blood clots. °? Fibrinolytic therapy. This breaks apart a blood clot. °? Blood pressure medicines. °? Nitroglycerin. °? Pain medicine. °? Cholesterol medicine. °· A procedure called coronary angioplasty and stenting. This is done to widen a narrowed artery and keep it open. °· Coronary artery bypass surgery. This allows blood to pass the blockage to reach your heart. °· Cardiac rehabilitation. This is a program that helps improve your health and well-being. It includes exercise training, education, and counseling to help you recover. ° °Follow these instructions at home: °Eating and drinking °· Follow a heart-healthy, low-salt (sodium) diet. °· Use healthy cooking methods such as roasting, grilling, broiling, baking, poaching, steaming, or stir-frying. °· Talk to a dietitian to learn about healthy cooking methods and how to eat less sodium. °Medicines °· Take over-the-counter and prescription medicines only as told by your health care provider. °· Do not take these medicines unless your health care provider approves: °? Nonsteroidal anti-inflammatory drugs (NSAIDs), such as ibuprofen, naproxen, or celecoxib. °? Vitamin supplements that contain vitamin A or vitamin E. °? Hormone replacement therapy that contains estrogen. °Activity °· Join a cardiac rehabilitation program. °· Ask your health care provider: °? What activities and exercises are safe for you. °? If you should follow specific instructions about lifting, driving, or climbing stairs. °· If you are taking aspirin and another blood thinning medicine, avoid activities that are likely to result in an injury. The medicines can increase your risk of bleeding. °Lifestyle °· Do not use any products that contain nicotine or tobacco, such as cigarettes   and e-cigarettes. If you need help quitting, ask your health care provider. °· If you drink alcohol and your health care  provider says it is okay to drink, limit your alcohol intake to no more than 1 drink per day. One drink equals 12 oz of beer, 5 oz of wine, or 1½ oz of hard liquor. °· Maintain a healthy weight. If you need to lose weight, do it in a way that has been approved by your health care provider. °General instructions °· Tell all your health care providers about your heart condition, including your dentist. Some medicines can increase your risk of arrhythmia. °· Manage other health conditions, such as hypertension and diabetes. These conditions affect your heart. °· Learn ways to manage stress. °· Get screened for depression, and seek treatment if needed. °· Monitor your blood pressure if told by your health care provider. °· Keep your vaccinations up to date. Get the annual influenza vaccine. °· Keep all follow-up visits as told by your health care provider. This is important. °Contact a health care provider if: °· You feel overwhelmed or sad. °· You have trouble with your daily activities. °Get help right away if: °· You have pain in your chest, neck, arm, jaw, stomach, or back that recurs, and: °? Lasts more than a few minutes. °? Is not relieved by taking the medicineyour health care provider prescribed. °· You have unexplained: °? Heavy sweating. °? Heartburn or indigestion. °? Shortness of breath. °? Difficulty breathing. °? Nausea or vomiting. °? Fatigue. °? Nervousness or anxiety. °? Weakness. °? Diarrhea. °? Dark stools or blood in the stool. °· You have sudden lightheadedness or dizziness. °· Your blood pressure is higher than 180/120 °· You faint. °· You feel like hurting yourself or think about taking your own life. °These symptoms may represent a serious problem that is an emergency. Do not wait to see if the symptoms will go away. Get medical help right away. Call your local emergency services (911 in the [place] Do not drive yourself to the clinic or hospital. °Summary °· Acute coronary syndrome (ACS) is a  when there is not enough blood and oxygen being supplied to the heart. ACS can result in chest pain or a heart attack. °· Acute coronary syndrome is a medical emergency. If you have any symptoms of this condition, get help right away. °· Treatment includes oxygen, medicines, and procedures to open the blocked arteries and restore blood flow. °This information is not intended to replace advice given to you by your health care provider. Make sure you discuss any questions you have with your health care provider. °Document Released: 02/18/2005 Document Revised: 03/22/2016 Document Reviewed: 03/22/2016 °Elsevier Interactive Patient Education © 2018 [employer] ° °

## 2017-09-25 ENCOUNTER — Emergency Department (HOSPITAL_COMMUNITY): Payer: 59

## 2017-09-25 ENCOUNTER — Encounter (HOSPITAL_COMMUNITY): Payer: Self-pay

## 2017-09-25 ENCOUNTER — Other Ambulatory Visit: Payer: Self-pay

## 2017-09-25 ENCOUNTER — Observation Stay (HOSPITAL_COMMUNITY)
Admission: EM | Admit: 2017-09-25 | Discharge: 2017-09-27 | Disposition: A | Discharge status: Home / Self Care | Payer: 59 | Attending: Family Medicine | Admitting: Family Medicine

## 2017-09-25 DIAGNOSIS — Z7982 Long term (current) use of aspirin: Secondary | ICD-10-CM | POA: Insufficient documentation

## 2017-09-25 DIAGNOSIS — K760 Fatty (change of) liver, not elsewhere classified: Secondary | ICD-10-CM | POA: Diagnosis present

## 2017-09-25 DIAGNOSIS — R945 Abnormal results of liver function studies: Secondary | ICD-10-CM

## 2017-09-25 DIAGNOSIS — Z881 Allergy status to other antibiotic agents status: Secondary | ICD-10-CM | POA: Diagnosis not present

## 2017-09-25 DIAGNOSIS — E785 Hyperlipidemia, unspecified: Secondary | ICD-10-CM | POA: Insufficient documentation

## 2017-09-25 DIAGNOSIS — Z888 Allergy status to other drugs, medicaments and biological substances status: Secondary | ICD-10-CM | POA: Diagnosis not present

## 2017-09-25 DIAGNOSIS — K621 Rectal polyp: Secondary | ICD-10-CM | POA: Insufficient documentation

## 2017-09-25 DIAGNOSIS — K635 Polyp of colon: Secondary | ICD-10-CM | POA: Diagnosis not present

## 2017-09-25 DIAGNOSIS — Z800 Family history of malignant neoplasm of digestive organs: Secondary | ICD-10-CM | POA: Diagnosis not present

## 2017-09-25 DIAGNOSIS — Z79899 Other long term (current) drug therapy: Secondary | ICD-10-CM | POA: Insufficient documentation

## 2017-09-25 DIAGNOSIS — I213 ST elevation (STEMI) myocardial infarction of unspecified site: Secondary | ICD-10-CM | POA: Insufficient documentation

## 2017-09-25 DIAGNOSIS — K529 Noninfective gastroenteritis and colitis, unspecified: Secondary | ICD-10-CM | POA: Diagnosis present

## 2017-09-25 DIAGNOSIS — Z880 Allergy status to penicillin: Secondary | ICD-10-CM | POA: Diagnosis not present

## 2017-09-25 DIAGNOSIS — E538 Deficiency of other specified B group vitamins: Secondary | ICD-10-CM | POA: Insufficient documentation

## 2017-09-25 DIAGNOSIS — R7989 Other specified abnormal findings of blood chemistry: Secondary | ICD-10-CM | POA: Insufficient documentation

## 2017-09-25 DIAGNOSIS — Z6841 Body Mass Index (BMI) 40.0 and over, adult: Secondary | ICD-10-CM | POA: Insufficient documentation

## 2017-09-25 DIAGNOSIS — Z955 Presence of coronary angioplasty implant and graft: Secondary | ICD-10-CM | POA: Insufficient documentation

## 2017-09-25 DIAGNOSIS — K921 Melena: Principal | ICD-10-CM | POA: Insufficient documentation

## 2017-09-25 DIAGNOSIS — I2510 Atherosclerotic heart disease of native coronary artery without angina pectoris: Secondary | ICD-10-CM | POA: Insufficient documentation

## 2017-09-25 DIAGNOSIS — Z720 Tobacco use: Secondary | ICD-10-CM | POA: Diagnosis present

## 2017-09-25 DIAGNOSIS — K644 Residual hemorrhoidal skin tags: Secondary | ICD-10-CM | POA: Insufficient documentation

## 2017-09-25 DIAGNOSIS — F17210 Nicotine dependence, cigarettes, uncomplicated: Secondary | ICD-10-CM | POA: Insufficient documentation

## 2017-09-25 DIAGNOSIS — K900 Celiac disease: Secondary | ICD-10-CM | POA: Insufficient documentation

## 2017-09-25 DIAGNOSIS — Z882 Allergy status to sulfonamides status: Secondary | ICD-10-CM | POA: Diagnosis not present

## 2017-09-25 DIAGNOSIS — K625 Hemorrhage of anus and rectum: Secondary | ICD-10-CM | POA: Diagnosis present

## 2017-09-25 DIAGNOSIS — Z87891 Personal history of nicotine dependence: Secondary | ICD-10-CM

## 2017-09-25 DIAGNOSIS — I219 Acute myocardial infarction, unspecified: Secondary | ICD-10-CM | POA: Diagnosis present

## 2017-09-25 DIAGNOSIS — I959 Hypotension, unspecified: Secondary | ICD-10-CM | POA: Diagnosis not present

## 2017-09-25 DIAGNOSIS — D62 Acute posthemorrhagic anemia: Secondary | ICD-10-CM | POA: Insufficient documentation

## 2017-09-25 DIAGNOSIS — R112 Nausea with vomiting, unspecified: Secondary | ICD-10-CM

## 2017-09-25 DIAGNOSIS — Z8249 Family history of ischemic heart disease and other diseases of the circulatory system: Secondary | ICD-10-CM | POA: Insufficient documentation

## 2017-09-25 HISTORY — DX: Headache, unspecified: R51.9

## 2017-09-25 HISTORY — DX: Dorsalgia, unspecified: M54.9

## 2017-09-25 HISTORY — DX: Acute myocardial infarction, unspecified: I21.9

## 2017-09-25 HISTORY — DX: Headache: R51

## 2017-09-25 HISTORY — DX: Atherosclerotic heart disease of native coronary artery without angina pectoris: I25.10

## 2017-09-25 LAB — CBC WITH DIFFERENTIAL/PLATELET
Basophils Absolute: 0 10*3/uL (ref 0.0–0.1)
Basophils Relative: 0 %
Eosinophils Absolute: 0 10*3/uL (ref 0.0–0.7)
Eosinophils Relative: 0 %
HCT: 40.7 % (ref 36.0–46.0)
Hemoglobin: 13.4 g/dL (ref 12.0–15.0)
Lymphocytes Relative: 28 %
Lymphs Abs: 3.6 10*3/uL (ref 0.7–4.0)
MCH: 25.8 pg — ABNORMAL LOW (ref 26.0–34.0)
MCHC: 32.9 g/dL (ref 30.0–36.0)
MCV: 78.4 fL (ref 78.0–100.0)
Monocytes Absolute: 1 10*3/uL (ref 0.1–1.0)
Monocytes Relative: 8 %
Neutro Abs: 8.1 10*3/uL — ABNORMAL HIGH (ref 1.7–7.7)
Neutrophils Relative %: 64 %
Platelets: 534 10*3/uL — ABNORMAL HIGH (ref 150–400)
RBC: 5.19 MIL/uL — ABNORMAL HIGH (ref 3.87–5.11)
RDW: 18.8 % — ABNORMAL HIGH (ref 11.5–15.5)
WBC: 12.8 10*3/uL — ABNORMAL HIGH (ref 4.0–10.5)

## 2017-09-25 LAB — COMPREHENSIVE METABOLIC PANEL
ALT: 119 U/L — ABNORMAL HIGH (ref 0–44)
AST: 123 U/L — ABNORMAL HIGH (ref 15–41)
Albumin: 2.3 g/dL — ABNORMAL LOW (ref 3.5–5.0)
Alkaline Phosphatase: 238 U/L — ABNORMAL HIGH (ref 38–126)
Anion gap: 7 (ref 5–15)
BUN: 11 mg/dL (ref 6–20)
CO2: 23 mmol/L (ref 22–32)
Calcium: 7.8 mg/dL — ABNORMAL LOW (ref 8.9–10.3)
Chloride: 110 mmol/L (ref 98–111)
Creatinine, Ser: 0.67 mg/dL (ref 0.44–1.00)
GFR calc Af Amer: >60 mL/min (ref >60)
GFR calc non Af Amer: >60 mL/min (ref >60)
Glucose, Bld: 92 mg/dL (ref 70–99)
Potassium: 3.9 mmol/L (ref 3.5–5.1)
Sodium: 140 mmol/L (ref 135–145)
Total Bilirubin: 0.4 mg/dL (ref 0.3–1.2)
Total Protein: 5.5 g/dL — ABNORMAL LOW (ref 6.5–8.1)

## 2017-09-25 LAB — TYPE AND SCREEN
ABO/RH(D): A POS
Antibody Screen: NEGATIVE

## 2017-09-25 LAB — MAGNESIUM: Magnesium: 1.8 mg/dL (ref 1.7–2.4)

## 2017-09-25 LAB — HEMOGLOBIN AND HEMATOCRIT, BLOOD
HCT: 36.2 % (ref 36.0–46.0)
Hemoglobin: 12 g/dL (ref 12.0–15.0)

## 2017-09-25 LAB — PROTIME-INR
INR: 1.14
Prothrombin Time: 14.5 seconds (ref 11.4–15.2)

## 2017-09-25 MED ORDER — SODIUM CHLORIDE 0.9 % IV SOLN
INTRAVENOUS | Status: DC
  Administered 2017-09-25 (×2): via INTRAVENOUS

## 2017-09-25 MED ORDER — ACETAMINOPHEN 650 MG RE SUPP: 650.0000 mg | Freq: Four times a day (QID) | RECTAL | Status: DC | PRN

## 2017-09-25 MED ORDER — TICAGRELOR 90 MG PO TABS
90.0000 mg | ORAL_TABLET | Freq: Two times a day (BID) | ORAL | Status: AC
  Administered 2017-09-25 – 2017-09-26 (×3): 90 mg via ORAL
  Filled 2017-09-25 (×3): qty 1

## 2017-09-25 MED ORDER — DEXTROSE-NACL 5-0.9 % IV SOLN
INTRAVENOUS | Status: AC
  Administered 2017-09-25 – 2017-09-26 (×2): via INTRAVENOUS

## 2017-09-25 MED ORDER — OXYCODONE-ACETAMINOPHEN 5-325 MG PO TABS: 1.0000 | ORAL_TABLET | Freq: Four times a day (QID) | ORAL | Status: DC | PRN

## 2017-09-25 MED ORDER — MELATONIN 10 MG PO TABS
5.0000 mg | ORAL_TABLET | Freq: Every day | ORAL | Status: DC
  Administered 2017-09-25: 5 mg via ORAL
  Filled 2017-09-25: qty 1

## 2017-09-25 MED ORDER — ASPIRIN 81 MG PO CHEW
81.0000 mg | CHEWABLE_TABLET | Freq: Every day | ORAL | Status: DC
  Administered 2017-09-26 – 2017-09-27 (×2): 81 mg via ORAL
  Filled 2017-09-25 (×2): qty 1

## 2017-09-25 MED ORDER — MELATONIN 10 MG PO TABS
50.0000 mg | ORAL_TABLET | Freq: Every day | ORAL | Status: DC
  Filled 2017-09-25: qty 1

## 2017-09-25 MED ORDER — ONDANSETRON HCL 4 MG PO TABS
4.0000 mg | ORAL_TABLET | Freq: Four times a day (QID) | ORAL | Status: DC | PRN
  Administered 2017-09-25: 4 mg via ORAL
  Filled 2017-09-25: qty 1

## 2017-09-25 MED ORDER — NITROGLYCERIN 0.4 MG SL SUBL: 0.4000 mg | SUBLINGUAL_TABLET | SUBLINGUAL | Status: DC | PRN

## 2017-09-25 MED ORDER — ONDANSETRON HCL 4 MG/2ML IJ SOLN
4.0000 mg | Freq: Once | INTRAMUSCULAR | Status: AC
  Administered 2017-09-25: 4 mg via INTRAVENOUS
  Filled 2017-09-25: qty 2

## 2017-09-25 MED ORDER — METOPROLOL TARTRATE 25 MG PO TABS
12.5000 mg | ORAL_TABLET | Freq: Two times a day (BID) | ORAL | Status: DC
  Administered 2017-09-25 – 2017-09-26 (×2): 12.5 mg via ORAL
  Filled 2017-09-25 (×2): qty 1

## 2017-09-25 MED ORDER — IOPAMIDOL (ISOVUE-300) INJECTION 61%
100.0000 mL | Freq: Once | INTRAVENOUS | Status: AC | PRN
  Administered 2017-09-25: 100 mL via INTRAVENOUS

## 2017-09-25 MED ORDER — ONDANSETRON HCL 4 MG/2ML IJ SOLN
4.0000 mg | Freq: Once | INTRAMUSCULAR | Status: AC
Start: 2017-09-25 — End: 2017-09-25
  Administered 2017-09-25: 4 mg via INTRAVENOUS
  Filled 2017-09-25: qty 2

## 2017-09-25 MED ORDER — ATORVASTATIN CALCIUM 40 MG PO TABS: 80.0000 mg | ORAL_TABLET | Freq: Every day | ORAL | Status: DC

## 2017-09-25 MED ORDER — ACETAMINOPHEN 325 MG PO TABS: 650.0000 mg | ORAL_TABLET | Freq: Four times a day (QID) | ORAL | Status: DC | PRN

## 2017-09-25 MED ORDER — SUCRALFATE 1 G PO TABS: 1.0000 g | ORAL_TABLET | Freq: Two times a day (BID) | ORAL | Status: DC | PRN

## 2017-09-25 MED ORDER — ONDANSETRON HCL 4 MG/2ML IJ SOLN
4.0000 mg | Freq: Four times a day (QID) | INTRAMUSCULAR | Status: DC | PRN
  Administered 2017-09-26: 4 mg via INTRAVENOUS
  Filled 2017-09-25: qty 2

## 2017-09-25 MED ORDER — GABAPENTIN 300 MG PO CAPS
900.0000 mg | ORAL_CAPSULE | Freq: Every day | ORAL | Status: DC
  Administered 2017-09-25 – 2017-09-26 (×2): 900 mg via ORAL
  Filled 2017-09-25 (×2): qty 3

## 2017-09-25 MED ORDER — FAMOTIDINE IN NACL 20-0.9 MG/50ML-% IV SOLN
20.0000 mg | Freq: Two times a day (BID) | INTRAVENOUS | Status: DC
Start: 2017-09-25 — End: 2017-09-27
  Administered 2017-09-25 – 2017-09-27 (×4): 20 mg via INTRAVENOUS
  Filled 2017-09-25 (×4): qty 50

## 2017-09-25 NOTE — ED Notes (Signed)
Report given to ICU RN

## 2017-09-25 NOTE — ED Provider Notes (Signed)
Blood pressure 100/64, pulse 81, temperature 98 F (36.7 C), temperature source Oral, resp. rate 18, height 5' (1.524 m), weight 95.7 kg (211 lb), SpO2 98 %.  Assuming care from Dr. Sabra Heck.  In short, Sharon Lawrence is a 52 y.o. female with a chief complaint of Rectal Bleeding .  Refer to the original H&P for additional details.  The current plan of care is to f/u with hospitalist regarding admission.  Discussed patient's case with Hospitalist, Dr. Crisoforo Oxford to request admission. Patient and family (if present) updated with plan. Care transferred to [facility] service.  I reviewed all nursing notes, vitals, pertinent old records, EKGs, labs, imaging (as available).   Sharon Quinton, MD    Sharon Fast, MD 09/25/17 [phone]

## 2017-09-25 NOTE — ED Provider Notes (Signed)
[facility] EMERGENCY DEPARTMENT Provider Note   CSN: [id number] Arrival date & time: 09/25/17  4765     History   Chief Complaint Chief Complaint  Patient presents with  . Rectal Bleeding    HPI Sharon Lawrence is a 52 y.o. female.  HPI  The patient is a 52 year old female, she is treated for chronic gastrointestinal problems including celiac disease, she also has a history of hyperlipidemia and tobacco use.  She was actually admitted to the hospital approximately 6 days ago when she was found to have an ST elevation MI, had a stent placed in what appeared to be a ruptured plaque in the proximal right coronary artery.  She had a normal left ventricular ejection fraction, she ultimately was placed on Brilinta and a baby aspirin which she has been taking regularly.  Complicating this is the fact that she has had diarrhea for approximately 3 months, this varies between watery at 12-15 bowel movements per day and sometimes loose at 2 or 3 bowel movements per day.  It was intermittently bloody, bright red blood (known history of hemorrhoids) but recently has had large volumes of blood in the toilet over the last 2 days.  She has had no less than 15 bowel movements in the last 24 hours, voluminous, watery, bright red blood.  She has no pain with the bowel movement but does have cramping in the lower abdomen prior to the bowel movement which is resolved after the bowel movement.  She has had nausea vomiting and very little oral intake.  The patient denies any prior significant colonic findings, she had a colonoscopy 7 years ago by Dr. Laural Golden and is scheduled to have both a endoscopy and colonoscopy coming up in 1 month.  She has had some associated lightheadedness over the last 24 hours  Past Medical History:  Diagnosis Date  . B12 deficiency 02/07/2011  . Celiac disease   . Folic acid deficiency 46/07/352  . Iron deficiency anemia 02/07/2011    Patient Active Problem List   Diagnosis Date  Noted  . Hyperlipidemia 09/17/2017  . Tobacco use 09/17/2017  . Acute ST elevation myocardial infarction (STEMI) of inferior wall ([place]) 09/15/2017  . Family hx of colon cancer 09/11/2017  . Non-intractable vomiting with nausea 09/11/2017  . Celiac disease 09/11/2017  . Vaginal condyloma 06/22/2013  . Obesity 06/22/2013  . Fatty liver disease, nonalcoholic 65/68/1275  . Folic acid deficiency 17/00/1749  . Iron deficiency 02/07/2011  . B12 deficiency 02/07/2011    Past Surgical History:  Procedure Laterality Date  . CHOLECYSTECTOMY    . CORONARY STENT INTERVENTION Right 09/15/2017   Procedure: CORONARY STENT INTERVENTION;  Surgeon: Jettie Booze, MD;  Location: [place] [facility];  Service: Cardiovascular;  Laterality: Right;  RCA  . KNEE CARTILAGE SURGERY Right   . LEFT HEART CATH AND CORONARY ANGIOGRAPHY N/A 09/15/2017   Procedure: LEFT HEART CATH AND CORONARY ANGIOGRAPHY;  Surgeon: Jettie Booze, MD;  Location: [place] [facility];  Service: Cardiovascular;  Laterality: N/A;  . TONSILLECTOMY       OB History    Gravida  3   Para  3   Term      Preterm  3   AB      Living  3     SAB      TAB      Ectopic      Multiple      Live Births  3  Home Medications    Prior to Admission medications   Medication Sig Start Date End Date Taking? Authorizing Provider  aspirin 81 MG chewable tablet Chew 1 tablet (81 mg total) by mouth daily. 09/18/17   Cheryln Manly, NP  atorvastatin (LIPITOR) 80 MG tablet Take 1 tablet (80 mg total) by mouth daily at 6 PM. 09/17/17   Reino Bellis B, NP  cholecalciferol (VITAMIN D) 1000 units tablet Take 5,000 Units by mouth daily.    [provider]  Cyanocobalamin (VITAMIN B-12 IJ) Inject as directed every 30 (thirty) days.     [provider]  gabapentin (NEURONTIN) 300 MG capsule Take 900 mg by mouth at bedtime.    [provider]  Melatonin 10 MG TABS Take 50 mg by mouth at  bedtime.    [provider]  metoprolol tartrate (LOPRESSOR) 25 MG tablet Take 1 tablet (25 mg total) by mouth 2 (two) times daily. 09/17/17   Cheryln Manly, NP  nitroGLYCERIN (NITROSTAT) 0.4 MG SL tablet Place 1 tablet (0.4 mg total) under the tongue every 5 (five) minutes as needed for chest pain. 09/17/17   Cheryln Manly, NP  ondansetron (ZOFRAN-ODT) 8 MG disintegrating tablet Take 1 tablet by mouth 3 (three) times daily as needed for nausea.  08/11/17   [provider]  oxyCODONE-acetaminophen (PERCOCET/ROXICET) 5-325 MG tablet Take 1 tablet by mouth every 6 (six) hours as needed for severe pain.    [provider]  promethazine (PHENERGAN) 25 MG tablet Take 1 tablet by mouth 3 (three) times daily as needed for nausea.  08/11/17   [provider]  ranitidine (ZANTAC) 150 MG tablet Take 1 tablet by mouth daily. 07/30/17   [provider]  sucralfate (CARAFATE) 1 g tablet Take 1 tablet by mouth 2 (two) times daily as needed (upset stomach).  07/30/17   [provider]  ticagrelor (BRILINTA) 90 MG TABS tablet Take 1 tablet (90 mg total) by mouth 2 (two) times daily. 09/17/17   Cheryln Manly, NP    Family History Family History  Problem Relation Age of Onset  . Cancer Mother   . Hypertension Mother   . Hypertension Father   . Hypothyroidism Father   . Cancer Sister   . CAD Maternal Grandfather   . Cirrhosis Paternal Grandfather   . CAD Paternal Grandmother   . Diabetes Paternal Grandmother   . Hypothyroidism Paternal Grandmother     Social History Social History   Tobacco Use  . Smoking status: Current Every Day Smoker    Packs/day: 0.25    Years: 12.00    Pack years: 3.00    Types: Cigarettes  . Smokeless tobacco: Never Used  Substance Use Topics  . Alcohol use: No  . Drug use: No     Allergies   Erythromycin; Neosporin original [bacitracin-neomycin-polymyxin]; Sulfa antibiotics; Tobrex [tobramycin]; Tramadol;  and Penicillins   Review of Systems Review of Systems  All other systems reviewed and are negative.    Physical Exam Updated Vital Signs BP 133/79 (BP Location: Left Arm)   Pulse 86   Temp 98 F (36.7 C) (Oral)   Resp 14   Ht 5' (1.524 m)   Wt 95.7 kg (211 lb)   SpO2 98%   BMI 41.21 kg/m   Physical Exam  Constitutional: She appears well-developed and well-nourished. No distress.  HENT:  Head: Normocephalic and atraumatic.  Mouth/Throat: Oropharynx is clear and moist. No oropharyngeal exudate.  Eyes: Pupils are equal,  round, and reactive to light. Conjunctivae and EOM are normal. Right eye exhibits no discharge. Left eye exhibits no discharge. No scleral icterus.  Neck: Normal range of motion. Neck supple. No JVD present. No thyromegaly present.  Cardiovascular: Normal rate, regular rhythm, normal heart sounds and intact distal pulses. Exam reveals no gallop and no friction rub.  No murmur heard. Pulmonary/Chest: Effort normal and breath sounds normal. No respiratory distress. She has no wheezes. She has no rales.  Abdominal: Soft. Bowel sounds are normal. She exhibits no distension and no mass. There is tenderness ( Mild epigastric and suprapubic tenderness, no guarding or peritoneal signs).  Genitourinary:  Genitourinary Comments: Chaperone present for exam, see separate anoscopy note  Musculoskeletal: Normal range of motion. She exhibits no edema or tenderness.  Lymphadenopathy:    She has no cervical adenopathy.  Neurological: She is alert. Coordination normal.  Skin: Skin is warm and dry. No rash noted. No erythema.  Psychiatric: She has a normal mood and affect. Her behavior is normal.  Nursing note and vitals reviewed.    ED Treatments / Results  Labs (all labs ordered are listed, but only abnormal results are displayed) Labs Reviewed - No data to display  EKG None  Radiology No results found.  Procedures  Procedure Note:  Anoscopy  Risks benefits  alternatives of the procedure given to the patient Verbal Consent obtained Patient placed in the lateral decubitus position Anoscopy performed Findings no external hemorrhoids, no fissures, no masses.  Internally there does appear to be several hemorrhoids present, there is no obvious source of bright red bleeding which appears to be coming proximal to the tip of the anoscope. Patient tolerated procedure without any complaints    Procedures (including critical care time)  Medications Ordered in ED Medications  0.9 %  sodium chloride infusion (has no administration in time range)  ondansetron (ZOFRAN) injection 4 mg (has no administration in time range)     Initial Impression / Assessment and Plan / ED Course  I have reviewed the triage vital signs and the nursing notes.  Pertinent labs & imaging results that were available during my care of the patient were reviewed by me and considered in my medical decision making (see chart for details).     The patient unfortunately has now further on medications including Brilinta and aspirin and continuing to have more voluminous amounts of bright red blood.  This does raise concern for distal gastrointestinal bleeding especially given the color of the bright red blood.  Her lightheadedness raises concern for anemia.  She will be given IV fluids, antinausea medicines, she will need to have her renal function checked as well as her hemoglobin.  We will consult with gastroenterology.  Discussed with Dr. Laural Golden, he recommends a CT scan as the patient does not have a gallbladder.  Labs show no significant anemia, hemoglobin of 13.4, she does have a leukocytosis.  LFTs are elevated, bilirubin normal  As the patient is on an anticoagulant, Brilinta Dr. Laural Golden has requested that the patient be admitted to the hospital and he will consult in the morning.  Hospitalist paged for admission at 3:30 PM.  The patient does appear hemodynamically  stable.  Final Clinical Impressions(s) / ED Diagnoses   Final diagnoses:  Rectal bleeding      Noemi Chapel, MD 09/25/17 1530

## 2017-09-25 NOTE — ED Triage Notes (Signed)
Pt has had bloody stool and diarrhea for the last 2 days. Hasn't been able to keep anything down. Pt is also vomiting. Has vomited twice in the last 24 hours. Pt has a stemi 11 days ago. Was sent to [facility]. Had cardiac cath done and was discharged from hospital.

## 2017-09-25 NOTE — H&P (Addendum)
History and Physical    OPEL LEJEUNE ZOX:[id number] DOB: 1965-05-23 DOA: 09/25/2017  PCP: Joyice Faster, FNP  Patient coming from: home  I have personally briefly reviewed patient's old medical records in [place]  Chief Complaint: I'm tired and dizzy today  HPI: Sharon Lawrence is a 52 y.o. female with medical history significant of celiac disease with chronic diarrhea and rectal bleeding (every time she has diarrhea, she will see drops of fresh blood on stool), morbid obese with fatty liver, former smoker, who just had a STEMI 10 days ago now s/p DES to RCA by Dr. Irish Lack at [facility], now p/w dizziness and generalized weakness. She stated that even on the day she started Brilinta and aspirin, she still had rectal bleeding (drops of blood by then) and she told cardiologist about that. Given her bleeding features have not changed and she is required to take non-stop DAPT for 12 months, pt originally scheduled colonoscopy appointment in the coming August was postponed. She continued to have rectal bleeding ever since she had DAPT, however, in the last 2-3 days, she noticed increasing amount of blood in toilet, today, it was just large pool of blood mixed with diarrhea. No particular abdominal pain other than her baseline discomfort from Celiac disease. She vomited once of nonbloody content yesterday but not today. Poor appetite today. She felt dizzy and weak today therefore decided to come to ER. No chest pain or SOB in last few days. Pt denies using NSAIDS since STEMI discharge from hospital.   ED Course: Hb stable at  13.4 which is her baseline. MCV 78.4 also not changed. BUN 11, mildly more elevated AST and ALT in low 100's. No ST-T changes per EKG. CT abd pelvis did not show acute pathology.  ER has started her on IVF and contacted GI. GI on call attending recommended observe patient overnight and will see pt in am.   Review of Systems: As per HPI otherwise 10 point review of systems  negative.     Past Medical History:  Diagnosis Date  . B12 deficiency 02/07/2011  . Celiac disease   . Folic acid deficiency 81/03/9145  . Iron deficiency anemia 02/07/2011    Past Surgical History:  Procedure Laterality Date  . CHOLECYSTECTOMY    . CORONARY STENT INTERVENTION Right 09/15/2017   Procedure: CORONARY STENT INTERVENTION;  Surgeon: Jettie Booze, MD;  Location: [place] [facility];  Service: Cardiovascular;  Laterality: Right;  RCA  . KNEE CARTILAGE SURGERY Right   . LEFT HEART CATH AND CORONARY ANGIOGRAPHY N/A 09/15/2017   Procedure: LEFT HEART CATH AND CORONARY ANGIOGRAPHY;  Surgeon: Jettie Booze, MD;  Location: [place] [facility];  Service: Cardiovascular;  Laterality: N/A;  . TONSILLECTOMY       reports that she has been smoking cigarettes.  She has a 3.00 pack-year smoking history. She has never used smokeless tobacco. She reports that she does not drink alcohol or use drugs.  Allergies  Allergen Reactions  . Erythromycin Anaphylaxis  . Neosporin Original [Bacitracin-Neomycin-Polymyxin] Swelling    Swelling at site of application  . Sulfa Antibiotics Swelling    Sulfa eye drops caused eyes to swell  . Tobrex [Tobramycin] Swelling  . Tramadol Hives  . Penicillins Rash    Has patient had a PCN reaction causing immediate rash, facial/tongue/throat swelling, SOB or lightheadedness with hypotension: Yes Has patient had a PCN reaction causing severe rash involving mucus membranes or skin necrosis: No Has patient  had a PCN reaction that required hospitalization: No Has patient had a PCN reaction occurring within the last 10 years: No If all of the above answers are "NO", then may proceed with Cephalosporin use.     Family History  Problem Relation Age of Onset  . Cancer Mother   . Hypertension Mother   . Hypertension Father   . Hypothyroidism Father   . Cancer Sister   . CAD Maternal Grandfather   . Cirrhosis Paternal Grandfather   . CAD Paternal  Grandmother   . Diabetes Paternal Grandmother   . Hypothyroidism Paternal Grandmother       Prior to Admission medications   Medication Sig Start Date End Date Taking? Authorizing Provider  aspirin 81 MG chewable tablet Chew 1 tablet (81 mg total) by mouth daily. 09/18/17  Yes Cheryln Manly, NP  atorvastatin (LIPITOR) 80 MG tablet Take 1 tablet (80 mg total) by mouth daily at 6 PM. 09/17/17  Yes Reino Bellis B, NP  cholecalciferol (VITAMIN D) 1000 units tablet Take 5,000 Units by mouth daily.   Yes [provider]  gabapentin (NEURONTIN) 300 MG capsule Take 900 mg by mouth at bedtime.   Yes [provider]  Melatonin 10 MG TABS Take 50 mg by mouth at bedtime.   Yes [provider]  metoprolol tartrate (LOPRESSOR) 25 MG tablet Take 1 tablet (25 mg total) by mouth 2 (two) times daily. 09/17/17  Yes Cheryln Manly, NP  oxyCODONE-acetaminophen (PERCOCET/ROXICET) 5-325 MG tablet Take 1 tablet by mouth every 6 (six) hours as needed for severe pain.   Yes [provider]  ticagrelor (BRILINTA) 90 MG TABS tablet Take 1 tablet (90 mg total) by mouth 2 (two) times daily. 09/17/17  Yes Cheryln Manly, NP  Cyanocobalamin (VITAMIN B-12 IJ) Inject as directed every 30 (thirty) days.     [provider]  nitroGLYCERIN (NITROSTAT) 0.4 MG SL tablet Place 1 tablet (0.4 mg total) under the tongue every 5 (five) minutes as needed for chest pain. 09/17/17   Cheryln Manly, NP  ondansetron (ZOFRAN-ODT) 8 MG disintegrating tablet Take 1 tablet by mouth 3 (three) times daily as needed for nausea.  08/11/17   [provider]  promethazine (PHENERGAN) 25 MG tablet Take 1 tablet by mouth 3 (three) times daily as needed for nausea.  08/11/17   [provider]  ranitidine (ZANTAC) 150 MG tablet Take 1 tablet by mouth daily. 07/30/17   [provider]  sucralfate (CARAFATE) 1 g tablet Take 1 tablet by mouth 2 (two) times daily as needed  (upset stomach).  07/30/17   [provider]    Physical Exam: Vitals:   09/25/17 1700 09/25/17 1730 09/25/17 1800 09/25/17 1830  BP: (!) 92/58 98/70 (!) 99/57 111/69  Pulse: 84 84 92 93  Resp: 19 17 18 18   Temp:      TempSrc:      SpO2: 97% 98% 98% 98%  Weight:      Height:        Constitutional: NAD, calm, comfortable Vitals:   09/25/17 1700 09/25/17 1730 09/25/17 1800 09/25/17 1830  BP: (!) 92/58 98/70 (!) 99/57 111/69  Pulse: 84 84 92 93  Resp: 19 17 18 18   Temp:      TempSrc:      SpO2: 97% 98% 98% 98%  Weight:      Height:      General: NAD, morbid obese Eyes: PERRL, lids and conjunctivae normal ENMT: Mucous  membranes are moist. Posterior pharynx clear of any exudate or lesions.Normal dentition.  Neck: normal, supple, no masses, no thyromegaly Respiratory: clear to auscultation bilaterally, no wheezing, no crackles. Normal respiratory effort. No accessory muscle use.  Cardiovascular: Regular rate and rhythm, no murmurs / rubs / gallops. No extremity edema. 2+ pedal pulses. No carotid bruits.  Abdomen: mild epigastric tenderness, no masses palpated. No hepatosplenomegaly. Bowel sounds positive. soft Musculoskeletal: no clubbing / cyanosis. No joint deformity upper and lower extremities. Good ROM, no contractures. Normal muscle tone.  Skin: no rashes, lesions, ulcers. No induration Neurologic: CN 2-12 grossly intact. Sensation intact, DTR normal. Strength 5/5 in all 4.  Psychiatric: Normal judgment and insight. Alert and oriented x 3. Normal mood.     Labs on Admission: I have personally reviewed following labs and imaging studies  CBC: Recent Labs  Lab 09/25/17 1014  WBC 12.8*  NEUTROABS 8.1*  HGB 13.4  HCT 40.7  MCV 78.4  PLT 749*   Basic Metabolic Panel: Recent Labs  Lab 09/25/17 1014  NA 140  K 3.9  CL 110  CO2 23  GLUCOSE 92  BUN 11  CREATININE 0.67  CALCIUM 7.8*  MG 1.8   GFR: Estimated Creatinine Clearance: 88.1 mL/min (by C-G  formula based on SCr of 0.67 mg/dL). Liver Function Tests: Recent Labs  Lab 09/25/17 1014  AST 123*  ALT 119*  ALKPHOS 238*  BILITOT 0.4  PROT 5.5*  ALBUMIN 2.3*   No results for input(s): LIPASE, AMYLASE in the last 168 hours. No results for input(s): AMMONIA in the last 168 hours. Coagulation Profile: Recent Labs  Lab 09/25/17 1014  INR 1.14   Cardiac Enzymes: No results for input(s): CKTOTAL, CKMB, CKMBINDEX, TROPONINI in the last 168 hours. BNP (last 3 results) No results for input(s): PROBNP in the last 8760 hours. HbA1C: No results for input(s): HGBA1C in the last 72 hours. CBG: No results for input(s): GLUCAP in the last 168 hours. Lipid Profile: No results for input(s): CHOL, HDL, LDLCALC, TRIG, CHOLHDL, LDLDIRECT in the last 72 hours. Thyroid Function Tests: No results for input(s): TSH, T4TOTAL, FREET4, T3FREE, THYROIDAB in the last 72 hours. Anemia Panel: No results for input(s): VITAMINB12, FOLATE, FERRITIN, TIBC, IRON, RETICCTPCT in the last 72 hours. Urine analysis: No results found for: COLORURINE, APPEARANCEUR, LABSPEC, [place], GLUCOSEU, HGBUR, BILIRUBINUR, KETONESUR, PROTEINUR, UROBILINOGEN, NITRITE, LEUKOCYTESUR  Radiological Exams on Admission: Ct Abdomen Pelvis W Contrast  Result Date: 09/25/2017 CLINICAL DATA:  Bloody diarrhea for 2 days EXAM: CT ABDOMEN AND PELVIS WITH CONTRAST TECHNIQUE: Multidetector CT imaging of the abdomen and pelvis was performed using the standard protocol following bolus administration of intravenous contrast. CONTRAST:  143m ISOVUE-300 IOPAMIDOL (ISOVUE-300) INJECTION 61% COMPARISON:  08/22/2017 FINDINGS: Lower chest: No acute abnormality. Hepatobiliary: Fatty infiltration of the liver is noted. The gallbladder has been surgically removed. Pancreas: Unremarkable. No pancreatic ductal dilatation or surrounding inflammatory changes. Spleen: Normal in size without focal abnormality. Adrenals/Urinary Tract: Bilateral hypodense  adrenal lesions are noted stable from the recent exam consistent with adenomas. Kidneys are well visualized bilaterally without renal calculi or obstructive changes. The bladder is partially decompressed. Stomach/Bowel: Stomach is within normal limits. Appendix appears normal. No evidence of bowel wall thickening, distention, or inflammatory changes. Vascular/Lymphatic: Aortic atherosclerosis. No enlarged abdominal or pelvic lymph nodes. Reproductive: IUD is noted in place. The uterus is otherwise within normal limits. Ovarian cystic changes are noted bilaterally stable from the previous exam. Other: No abdominal wall hernia or abnormality. No abdominopelvic ascites.  Musculoskeletal: Degenerative changes of the lumbar spine are noted. IMPRESSION: Stable bilateral adrenal lesions. Fatty infiltration of the liver. No acute abnormality noted. Electronically Signed   By: Inez Catalina M.D.   On: 09/25/2017 15:10    EKG: Independently reviewed. NSR, no ST-T changes.  Assessment/Plan Principal Problem:   Hematochezia Active Problems:   Fatty liver disease, nonalcoholic   Celiac disease   Tobacco use   STEMI (ST elevation myocardial infarction) ([place]), 09/15/17   Hypotension   Morbidly obese (HCC)   LFTs abnormal   Chronic diarrhea, with Celiac disease    Plan: -observation with telemetry -discussed with Dr. Radford Pax (oncall cardiologist), unless patient has significant drop of Hb and/or hemodynamically unstable due to hematochezia, pt has to stay on DAPT due to STEMI just occurred 10 days ago with DES to RCA. Per Dr. Radford Pax, highly urging gastroenterologist consider inpatient scope for pt's hematochezia issue despite she's on DAPT -IV NS and cycle H&H q8h; transfuse if Hb <9 given recent STEMI; even she's bleeding rectally, will continue her carafate and start iv pepcid (her BUN is normal which indicates most likely no UGIB) -appreciate GI input. ER has consulted Dr. Laural Golden. In case there will be scope  shortly, will keep pt NPO now, start D5NS -avoid NSAIDS -continue all other CAD medications except change lopressor to 12.5 mg with holding parameter given borderline hypotension  DVT prophylaxis: SCD  Code Status: full code  Family Communication: parents at bedside  Disposition Plan: home in 1-2 days  Consults called: Dr. Radford Pax (cardiology) Dr. Laural Golden (GI) Admission status: obs  Paticia Stack MD [facility] Pager [phone]  If 7PM-7AM, please contact night-coverage [email or web address] Password [facility]  09/25/2017, 6:46 PM

## 2017-09-25 NOTE — ED Notes (Signed)
Dr Miller at bedside. 

## 2017-09-25 NOTE — Discharge Instructions (Addendum)
Stop taking brilinta and start taking the Plavix instead per your heart doctors.   Please stop smoking.   Use the suppositories twice per day for next 6 days. We are having you hold your metoprolol for now due to low blood pressure. Follow up with your cardiologist to see if they want you to restart it later. Your cholesterol medication (atorvastatin) has been reduced to 40 mg by your HeartCare team.      Follow with Primary MD  Joyice Faster, FNP  and other consultants as instructed your Hospitalist MD  Please get a complete blood count and chemistry panel checked by your Primary MD at your next visit, and again as instructed by your Primary MD.  Get Medicines reviewed and adjusted: Please take all your medications with you for your next visit with your Primary MD  Laboratory/radiological data: Please request your Primary MD to go over all hospital tests and procedure/radiological results at the follow up, please ask your Primary MD to get all Hospital records sent to his/her office.  In some cases, they will be blood work, cultures and biopsy results pending at the time of your discharge. Please request that your primary care M.D. follows up on these results.  Also Note the following: If you experience worsening of your admission symptoms, develop shortness of breath, life threatening emergency, suicidal or homicidal thoughts you must seek medical attention immediately by calling 911 or calling your MD immediately  if symptoms less severe.  You must read complete instructions/literature along with all the possible adverse reactions/side effects for all the Medicines you take and that have been prescribed to you. Take any new Medicines after you have completely understood and accpet all the possible adverse reactions/side effects.   Do not drive when taking Pain medications or sleeping medications (Benzodiazepines)  Do not take more than prescribed Pain, Sleep and Anxiety Medications.  It is not advisable to combine anxiety,sleep and pain medications without talking with your primary care practitioner  Special Instructions: If you have smoked or chewed Tobacco  in the last 2 yrs please stop smoking, stop any regular Alcohol  and or any Recreational drug use.  Wear Seat belts while driving.  Please note: You were cared for by a hospitalist during your hospital stay. Once you are discharged, your primary care physician will handle any further medical issues. Please note that NO REFILLS for any discharge medications will be authorized once you are discharged, as it is imperative that you return to your primary care physician (or establish a relationship with a primary care physician if you do not have one) for your post hospital discharge needs so that they can reassess your need for medications and monitor your lab values.  If you wish to quit smoking, help is available.  For free tobacco cessation program offerings call the [facility] at [phone] or Live Well Line at [phone]. You may also visit [email or web address] or email [email or web address]  for more information on other programs.   Steps to Quit Smoking Smoking tobacco can be harmful to your health and can affect almost every organ in your body. Smoking puts you, and those around you, at risk for developing many serious chronic diseases. Quitting smoking is difficult, but it is one of the best things that you can do for your health. It is never too late to quit. What are the benefits of quitting smoking? When you quit smoking, you lower your risk of developing serious diseases and  conditions, such as:  Lung cancer or lung disease, such as COPD.  Heart disease.  Stroke.  Heart attack.  Infertility.  Osteoporosis and bone fractures.  Additionally, symptoms such as coughing, wheezing, and shortness of breath may get better when you quit. You may also find that you get sick less often  because your body is stronger at fighting off colds and infections. If you are pregnant, quitting smoking can help to reduce your chances of having a baby of low birth weight. How do I get ready to quit? When you decide to quit smoking, create a plan to make sure that you are successful. Before you quit:  Pick a date to quit. Set a date within the next two weeks to give you time to prepare.  Write down the reasons why you are quitting. Keep this list in places where you will see it often, such as on your bathroom mirror or in your car or wallet.  Identify the people, places, things, and activities that make you want to smoke (triggers) and avoid them. Make sure to take these actions: ? Throw away all cigarettes at home, at work, and in your car. ? Throw away smoking accessories, such as Scientist, research (medical). ? Clean your car and make sure to empty the ashtray. ? Clean your home, including curtains and carpets.  Tell your family, friends, and coworkers that you are quitting. Support from your loved ones can make quitting easier.  Talk with your health care provider about your options for quitting smoking.  Find out what treatment options are covered by your health insurance.  What strategies can I use to quit smoking? Talk with your healthcare provider about different strategies to quit smoking. Some strategies include:  Quitting smoking altogether instead of gradually lessening how much you smoke over a period of time. Research shows that quitting cold [place] is more successful than gradually quitting.  Attending in-person counseling to help you build problem-solving skills. You are more likely to have success in quitting if you attend several counseling sessions. Even short sessions of 10 minutes can be effective.  Finding resources and support systems that can help you to quit smoking and remain smoke-free after you quit. These resources are most helpful when you use them often. They  can include: ? Online chats with a Social worker. ? Telephone quitlines. ? Careers information officer. ? Support groups or group counseling. ? Text messaging programs. ? Mobile phone applications.  Taking medicines to help you quit smoking. (If you are pregnant or breastfeeding, talk with your health care provider first.) Some medicines contain nicotine and some do not. Both types of medicines help with cravings, but the medicines that include nicotine help to relieve withdrawal symptoms. Your health care provider may recommend: ? Nicotine patches, gum, or lozenges. ? Nicotine inhalers or sprays. ? Non-nicotine medicine that is taken by mouth.  Talk with your health care provider about combining strategies, such as taking medicines while you are also receiving in-person counseling. Using these two strategies together makes you more likely to succeed in quitting than if you used either strategy on its own. If you are pregnant or breastfeeding, talk with your health care provider about finding counseling or other support strategies to quit smoking. Do not take medicine to help you quit smoking unless told to do so by your health care provider. What things can I do to make it easier to quit? Quitting smoking might feel overwhelming at first, but there is a lot  that you can do to make it easier. Take these important actions:  Reach out to your family and friends and ask that they support and encourage you during this time. Call telephone quitlines, reach out to support groups, or work with a counselor for support.  Ask people who smoke to avoid smoking around you.  Avoid places that trigger you to smoke, such as bars, parties, or smoke-break areas at work.  Spend time around people who do not smoke.  Lessen stress in your life, because stress can be a smoking trigger for some people. To lessen stress, try: ? Exercising regularly. ? Deep-breathing exercises. ? Yoga. ? Meditating. ? Performing  a body scan. This involves closing your eyes, scanning your body from head to toe, and noticing which parts of your body are particularly tense. Purposefully relax the muscles in those areas.  Download or purchase mobile phone or tablet apps (applications) that can help you stick to your quit plan by providing reminders, tips, and encouragement. There are many free apps, such as QuitGuide from the [employer] Office manager for Disease Control and Prevention). You can find other support for quitting smoking (smoking cessation) through smokefree.gov and other websites.  How will I feel when I quit smoking? Within the first 24 hours of quitting smoking, you may start to feel some withdrawal symptoms. These symptoms are usually most noticeable 2-3 days after quitting, but they usually do not last beyond 2-3 weeks. Changes or symptoms that you might experience include:  Mood swings.  Restlessness, anxiety, or irritation.  Difficulty concentrating.  Dizziness.  Strong cravings for sugary foods in addition to nicotine.  Mild weight gain.  Constipation.  Nausea.  Coughing or a sore throat.  Changes in how your medicines work in your body.  A depressed mood.  Difficulty sleeping (insomnia).  After the first 2-3 weeks of quitting, you may start to notice more positive results, such as:  Improved sense of smell and taste.  Decreased coughing and sore throat.  Slower heart rate.  Lower blood pressure.  Clearer skin.  The ability to breathe more easily.  Fewer sick days.  Quitting smoking is very challenging for most people. Do not get discouraged if you are not successful the first time. Some people need to make many attempts to quit before they achieve long-term success. Do your best to stick to your quit plan, and talk with your health care provider if you have any questions or concerns. This information is not intended to replace advice given to you by your health care provider. Make sure  you discuss any questions you have with your health care provider. Document Released: 02/12/2001 Document Revised: 10/17/2015 Document Reviewed: 07/05/2014 Elsevier Interactive Patient Education  2018 [place] with Quitting Smoking Quitting smoking is a physical and mental challenge. You will face cravings, withdrawal symptoms, and temptation. Before quitting, work with your health care provider to make a plan that can help you cope. Preparation can help you quit and keep you from giving in. How can I cope with cravings? Cravings usually last for 5-10 minutes. If you get through it, the craving will pass. Consider taking the following actions to help you cope with cravings:  Keep your mouth busy: ? Chew sugar-free gum. ? Suck on hard candies or a straw. ? Brush your teeth.  Keep your hands and body busy: ? Immediately change to a different activity when you feel a craving. ? Squeeze or play with a ball. ? Do  an activity or a hobby, like making bead jewelry, practicing needlepoint, or working with wood. ? Mix up your normal routine. ? Take a short exercise break. Go for a quick walk or run up and down stairs. ? Spend time in public places where smoking is not allowed.  Focus on doing something kind or helpful for someone else.  Call a friend or family member to talk during a craving.  Join a support group.  Call a quit line, such as 1-800-QUIT-NOW.  Talk with your health care provider about medicines that might help you cope with cravings and make quitting easier for you.  How can I deal with withdrawal symptoms? Your body may experience negative effects as it tries to get used to not having nicotine in the system. These effects are called withdrawal symptoms. They may include:  Feeling hungrier than normal.  Trouble concentrating.  Irritability.  Trouble sleeping.  Feeling depressed.  Restlessness and agitation.  Craving a cigarette.  To manage  withdrawal symptoms:  Avoid places, people, and activities that trigger your cravings.  Remember why you want to quit.  Get plenty of sleep.  Avoid coffee and other caffeinated drinks. These may worsen some of your symptoms.  How can I handle social situations? Social situations can be difficult when you are quitting smoking, especially in the first few weeks. To manage this, you can:  Avoid parties, bars, and other social situations where people might be smoking.  Avoid alcohol.  Leave right away if you have the urge to smoke.  Explain to your family and friends that you are quitting smoking. Ask for understanding and support.  Plan activities with friends or family where smoking is not an option.  What are some ways I can cope with stress? Wanting to smoke may cause stress, and stress can make you want to smoke. Find ways to manage your stress. Relaxation techniques can help. For example:  Breathe slowly and deeply, in through your nose and out through your mouth.  Listen to soothing, relaxing music.  Talk with a family member or friend about your stress.  Light a candle.  Soak in a bath or take a shower.  Think about a peaceful place.  What are some ways I can prevent weight gain? Be aware that many people gain weight after they quit smoking. However, not everyone does. To keep from gaining weight, have a plan in place before you quit and stick to the plan after you quit. Your plan should include:  Having healthy snacks. When you have a craving, it may help to: ? Eat plain popcorn, crunchy carrots, celery, or other cut vegetables. ? Chew sugar-free gum.  Changing how you eat: ? Eat small portion sizes at meals. ? Eat 4-6 small meals throughout the day instead of 1-2 large meals a day. ? Be mindful when you eat. Do not watch television or do other things that might distract you as you eat.  Exercising regularly: ? Make time to exercise each day. If you do not have  time for a long workout, do short bouts of exercise for 5-10 minutes several times a day. ? Do some form of strengthening exercise, like weight lifting, and some form of aerobic exercise, like running or swimming.  Drinking plenty of water or other low-calorie or no-calorie drinks. Drink 6-8 glasses of water daily, or as much as instructed by your health care provider.  Summary  Quitting smoking is a physical and mental challenge. You will face  cravings, withdrawal symptoms, and temptation to smoke again. Preparation can help you as you go through these challenges.  You can cope with cravings by keeping your mouth busy (such as by chewing gum), keeping your body and hands busy, and making calls to family, friends, or a helpline for people who want to quit smoking.  You can cope with withdrawal symptoms by avoiding places where people smoke, avoiding drinks with caffeine, and getting plenty of rest.  Ask your health care provider about the different ways to prevent weight gain, avoid stress, and handle social situations. This information is not intended to replace advice given to you by your health care provider. Make sure you discuss any questions you have with your health care provider. Document Released: 02/16/2016 Document Revised: 02/16/2016 Document Reviewed: 02/16/2016 Elsevier Interactive Patient Education  [employer].

## 2017-09-26 ENCOUNTER — Encounter (HOSPITAL_COMMUNITY)
Admission: EM | Disposition: A | Discharge status: Home / Self Care | Payer: Self-pay | Source: Home / Self Care | Attending: Emergency Medicine

## 2017-09-26 ENCOUNTER — Encounter (HOSPITAL_COMMUNITY): Payer: Self-pay

## 2017-09-26 ENCOUNTER — Other Ambulatory Visit: Payer: Self-pay

## 2017-09-26 ENCOUNTER — Observation Stay (HOSPITAL_COMMUNITY): Payer: 59 | Admitting: Anesthesiology

## 2017-09-26 DIAGNOSIS — I2111 ST elevation (STEMI) myocardial infarction involving right coronary artery: Secondary | ICD-10-CM

## 2017-09-26 DIAGNOSIS — K529 Noninfective gastroenteritis and colitis, unspecified: Secondary | ICD-10-CM | POA: Diagnosis not present

## 2017-09-26 DIAGNOSIS — E785 Hyperlipidemia, unspecified: Secondary | ICD-10-CM

## 2017-09-26 DIAGNOSIS — K644 Residual hemorrhoidal skin tags: Secondary | ICD-10-CM

## 2017-09-26 DIAGNOSIS — R945 Abnormal results of liver function studies: Secondary | ICD-10-CM

## 2017-09-26 DIAGNOSIS — D62 Acute posthemorrhagic anemia: Secondary | ICD-10-CM

## 2017-09-26 DIAGNOSIS — K760 Fatty (change of) liver, not elsewhere classified: Secondary | ICD-10-CM | POA: Diagnosis not present

## 2017-09-26 DIAGNOSIS — K900 Celiac disease: Secondary | ICD-10-CM | POA: Diagnosis not present

## 2017-09-26 DIAGNOSIS — D125 Benign neoplasm of sigmoid colon: Secondary | ICD-10-CM

## 2017-09-26 DIAGNOSIS — K621 Rectal polyp: Secondary | ICD-10-CM

## 2017-09-26 DIAGNOSIS — I959 Hypotension, unspecified: Secondary | ICD-10-CM

## 2017-09-26 DIAGNOSIS — K921 Melena: Secondary | ICD-10-CM | POA: Diagnosis not present

## 2017-09-26 DIAGNOSIS — K625 Hemorrhage of anus and rectum: Secondary | ICD-10-CM | POA: Diagnosis not present

## 2017-09-26 DIAGNOSIS — Z720 Tobacco use: Secondary | ICD-10-CM | POA: Diagnosis not present

## 2017-09-26 DIAGNOSIS — I2510 Atherosclerotic heart disease of native coronary artery without angina pectoris: Secondary | ICD-10-CM

## 2017-09-26 DIAGNOSIS — D123 Benign neoplasm of transverse colon: Secondary | ICD-10-CM

## 2017-09-26 DIAGNOSIS — Z87891 Personal history of nicotine dependence: Secondary | ICD-10-CM

## 2017-09-26 DIAGNOSIS — I213 ST elevation (STEMI) myocardial infarction of unspecified site: Secondary | ICD-10-CM | POA: Diagnosis not present

## 2017-09-26 HISTORY — PX: COLONOSCOPY WITH PROPOFOL: SHX5780

## 2017-09-26 LAB — COMPREHENSIVE METABOLIC PANEL
ALT: 91 U/L — ABNORMAL HIGH (ref 0–44)
AST: 91 U/L — ABNORMAL HIGH (ref 15–41)
Albumin: 1.9 g/dL — ABNORMAL LOW (ref 3.5–5.0)
Alkaline Phosphatase: 181 U/L — ABNORMAL HIGH (ref 38–126)
Anion gap: 6 (ref 5–15)
BUN: 10 mg/dL (ref 6–20)
CO2: 21 mmol/L — ABNORMAL LOW (ref 22–32)
Calcium: 7.4 mg/dL — ABNORMAL LOW (ref 8.9–10.3)
Chloride: 115 mmol/L — ABNORMAL HIGH (ref 98–111)
Creatinine, Ser: 0.79 mg/dL (ref 0.44–1.00)
GFR calc Af Amer: >60 mL/min (ref >60)
GFR calc non Af Amer: >60 mL/min (ref >60)
Glucose, Bld: 86 mg/dL (ref 70–99)
Potassium: 3.9 mmol/L (ref 3.5–5.1)
Sodium: 142 mmol/L (ref 135–145)
Total Bilirubin: 0.4 mg/dL (ref 0.3–1.2)
Total Protein: 4.4 g/dL — ABNORMAL LOW (ref 6.5–8.1)

## 2017-09-26 LAB — CBC
HCT: 35.9 % — ABNORMAL LOW (ref 36.0–46.0)
Hemoglobin: 11.3 g/dL — ABNORMAL LOW (ref 12.0–15.0)
MCH: 24.9 pg — ABNORMAL LOW (ref 26.0–34.0)
MCHC: 31.5 g/dL (ref 30.0–36.0)
MCV: 79.1 fL (ref 78.0–100.0)
Platelets: 487 10*3/uL — ABNORMAL HIGH (ref 150–400)
RBC: 4.54 MIL/uL (ref 3.87–5.11)
RDW: 19.1 % — ABNORMAL HIGH (ref 11.5–15.5)
WBC: 8.3 10*3/uL (ref 4.0–10.5)

## 2017-09-26 SURGERY — COLONOSCOPY WITH PROPOFOL
Anesthesia: Monitor Anesthesia Care

## 2017-09-26 MED ORDER — SODIUM CHLORIDE 0.9 % IV SOLN: INTRAVENOUS | Status: DC

## 2017-09-26 MED ORDER — PANTOPRAZOLE SODIUM 40 MG PO TBEC
40.0000 mg | DELAYED_RELEASE_TABLET | Freq: Every day | ORAL | Status: DC
  Administered 2017-09-27: 40 mg via ORAL
  Filled 2017-09-26: qty 1

## 2017-09-26 MED ORDER — ONDANSETRON HCL 4 MG/2ML IJ SOLN
INTRAMUSCULAR | Status: DC | PRN
  Administered 2017-09-26: 4 mg via INTRAVENOUS

## 2017-09-26 MED ORDER — LACTATED RINGERS IV SOLN
INTRAVENOUS | Status: DC | PRN
  Administered 2017-09-26: 16:00:00 via INTRAVENOUS

## 2017-09-26 MED ORDER — PROPOFOL 500 MG/50ML IV EMUL
INTRAVENOUS | Status: DC | PRN
  Administered 2017-09-26: 150 ug/kg/min via INTRAVENOUS

## 2017-09-26 MED ORDER — MELATONIN 3 MG PO TABS
3.0000 mg | ORAL_TABLET | Freq: Every day | ORAL | Status: DC
  Administered 2017-09-26: 3 mg via ORAL
  Filled 2017-09-26 (×3): qty 1

## 2017-09-26 MED ORDER — ATORVASTATIN CALCIUM 40 MG PO TABS
40.0000 mg | ORAL_TABLET | Freq: Every day | ORAL | Status: DC
  Filled 2017-09-26: qty 1

## 2017-09-26 MED ORDER — HYDROCORTISONE ACETATE 25 MG RE SUPP
25.0000 mg | Freq: Two times a day (BID) | RECTAL | Status: DC
  Administered 2017-09-26 – 2017-09-27 (×2): 25 mg via RECTAL
  Filled 2017-09-26 (×3): qty 1

## 2017-09-26 MED ORDER — MEPERIDINE HCL 100 MG/ML IJ SOLN: 6.2500 mg | INTRAMUSCULAR | Status: DC | PRN

## 2017-09-26 MED ORDER — MIDAZOLAM HCL 2 MG/2ML IJ SOLN
INTRAMUSCULAR | Status: AC
  Filled 2017-09-26: qty 2

## 2017-09-26 MED ORDER — MORPHINE SULFATE (PF) 2 MG/ML IV SOLN: 1.0000 mg | INTRAVENOUS | Status: DC | PRN

## 2017-09-26 MED ORDER — ONDANSETRON HCL 4 MG/2ML IJ SOLN: 4.0000 mg | Freq: Once | INTRAMUSCULAR | Status: DC | PRN

## 2017-09-26 MED ORDER — CLOPIDOGREL BISULFATE 75 MG PO TABS
300.0000 mg | ORAL_TABLET | Freq: Every day | ORAL | Status: AC
  Administered 2017-09-27: 300 mg via ORAL
  Filled 2017-09-26: qty 4

## 2017-09-26 MED ORDER — MIDAZOLAM HCL 5 MG/5ML IJ SOLN
INTRAMUSCULAR | Status: DC | PRN
  Administered 2017-09-26: 2 mg via INTRAVENOUS

## 2017-09-26 MED ORDER — CLOPIDOGREL BISULFATE 75 MG PO TABS: 75.0000 mg | ORAL_TABLET | Freq: Every day | ORAL | Status: DC

## 2017-09-26 MED ORDER — PEG 3350-KCL-NA BICARB-NACL 420 G PO SOLR
4000.0000 mL | Freq: Once | ORAL | Status: AC
  Administered 2017-09-26: 4000 mL via ORAL
  Filled 2017-09-26: qty 4000

## 2017-09-26 NOTE — Progress Notes (Addendum)
PROGRESS NOTE   Sharon Lawrence  SFK:[id number]  DOB: Dec 21, 1965  DOA: 09/25/2017 PCP: Joyice Faster, FNP   Brief Admission Hx:  Sharon Lawrence is a 52 y.o. female with medical history significant of celiac disease with chronic diarrhea and rectal bleeding (every time she has diarrhea, she will see drops of fresh blood on stool), morbid obese with fatty liver, former smoker, who just had a STEMI 10 days ago now s/p DES to RCA by Dr. Irish Lack at [facility], now p/w dizziness and generalized weakness. She stated that even on the day she started Brilinta and aspirin, she still had rectal bleeding (drops of blood by then) and she told cardiologist about that. Given her bleeding features have not changed and she is required to take non-stop DAPT for 12 months.  She is admitted for rectal bleeding.   MDM/Assessment & Plan:   1. Rectal bleeding - Pt has only had a small drop in hemoglobin, she remains on DAPT at the strong urgence of the cardiology team.  Recommending to transfuse if Hg less than 9.  GI consult pending. Further recommendations to follow.  Pt remains NPO except sips with meds.  Continue IV pepcid, She is on carafate.   2. CAD s/p recent STEMI - DES to RCA, she is on DAPT per cardiology team and that has been continued. Lopressor dose reduced on admission due to softer BPs.  Cardiology team was consulted.  3. Hepatic steatosis - Pt has elevated liver enzymes consistent with chronic fatty liver disease.  AM CMP pending.  4. Celiac Disease /Chronic diarrhea - She is followed by Caroline More with the GI clinic for this.   5. Tobacco abuse - Pt will be counseled to please stop using all tobacco products, especially given her recent STEMI.  6. Thrombocytosis - question if this is reactive given recent medical problems, will follow.   DVT prophylaxis: SCDs Code Status: Full  Family Communication: patient Disposition Plan: Home when medically stabilized  Consultants:  Cardiology  GI    Subjective: Pt says that she has had no BM and no bleeding since admission, denies chest pain and SOB.   Objective: Vitals:   09/26/17 0300 09/26/17 0400 09/26/17 0500 09/26/17 0600  BP: 95/67 110/73 103/70 101/66  Pulse: 65 67 76 66  Resp: 18 17 14 17   Temp:   97.8 F (36.6 C)   TempSrc:   Oral   SpO2: 96% 97% 97% 97%  Weight:   95.1 kg (209 lb 10.5 oz)   Height:        Intake/Output Summary (Last 24 hours) at 09/26/2017 0654 Last data filed at 09/26/2017 0600 Gross per 24 hour  Intake 1383.33 ml  Output -  Net 1383.33 ml   Filed Weights   09/25/17 0937 09/25/17 1900 09/26/17 0500  Weight: 95.7 kg (211 lb) 95.7 kg (210 lb 15.7 oz) 95.1 kg (209 lb 10.5 oz)   REVIEW OF SYSTEMS  As per history otherwise all reviewed and reported negative  Exam:  General exam: obese female, awake, alert, NAD, cooperative.  Respiratory system: Clear. No increased work of breathing. Cardiovascular system: S1 & S2 heard, RRR. No JVD, murmurs, gallops.  Gastrointestinal system: Abdomen is nondistended, soft and nontender. Normal bowel sounds heard. Mild hepatomegaly.  Central nervous system: Alert and oriented. No focal neurological deficits. Extremities: no Cyanosis or clubbing.  Data Reviewed: Basic Metabolic Panel: Recent Labs  Lab 09/25/17 1014  NA 140  K 3.9  CL 110  CO2 23  GLUCOSE 92  BUN 11  CREATININE 0.67  CALCIUM 7.8*  MG 1.8   Liver Function Tests: Recent Labs  Lab 09/25/17 1014  AST 123*  ALT 119*  ALKPHOS 238*  BILITOT 0.4  PROT 5.5*  ALBUMIN 2.3*   No results for input(s): LIPASE, AMYLASE in the last 168 hours. No results for input(s): AMMONIA in the last 168 hours. CBC: Recent Labs  Lab 09/25/17 1014 09/25/17 2115 09/26/17 0427  WBC 12.8*  --  8.3  NEUTROABS 8.1*  --   --   HGB 13.4 12.0 11.3*  HCT 40.7 36.2 35.9*  MCV 78.4  --  79.1  PLT 534*  --  487*   Cardiac Enzymes: No results for input(s): CKTOTAL, CKMB, CKMBINDEX, TROPONINI in the  last 168 hours. CBG (last 3)  No results for input(s): GLUCAP in the last 72 hours. No results found for this or any previous visit (from the past 240 hour(s)).   Studies: Ct Abdomen Pelvis W Contrast  Result Date: 09/25/2017 CLINICAL DATA:  Bloody diarrhea for 2 days EXAM: CT ABDOMEN AND PELVIS WITH CONTRAST TECHNIQUE: Multidetector CT imaging of the abdomen and pelvis was performed using the standard protocol following bolus administration of intravenous contrast. CONTRAST:  135m ISOVUE-300 IOPAMIDOL (ISOVUE-300) INJECTION 61% COMPARISON:  08/22/2017 FINDINGS: Lower chest: No acute abnormality. Hepatobiliary: Fatty infiltration of the liver is noted. The gallbladder has been surgically removed. Pancreas: Unremarkable. No pancreatic ductal dilatation or surrounding inflammatory changes. Spleen: Normal in size without focal abnormality. Adrenals/Urinary Tract: Bilateral hypodense adrenal lesions are noted stable from the recent exam consistent with adenomas. Kidneys are well visualized bilaterally without renal calculi or obstructive changes. The bladder is partially decompressed. Stomach/Bowel: Stomach is within normal limits. Appendix appears normal. No evidence of bowel wall thickening, distention, or inflammatory changes. Vascular/Lymphatic: Aortic atherosclerosis. No enlarged abdominal or pelvic lymph nodes. Reproductive: IUD is noted in place. The uterus is otherwise within normal limits. Ovarian cystic changes are noted bilaterally stable from the previous exam. Other: No abdominal wall hernia or abnormality. No abdominopelvic ascites. Musculoskeletal: Degenerative changes of the lumbar spine are noted. IMPRESSION: Stable bilateral adrenal lesions. Fatty infiltration of the liver. No acute abnormality noted. Electronically Signed   By: MInez CatalinaM.D.   On: 09/25/2017 15:10     Scheduled Meds: . aspirin  81 mg Oral Daily  . atorvastatin  80 mg Oral q1800  . gabapentin  900 mg Oral QHS  .  Melatonin  5 mg Oral QHS  . metoprolol tartrate  12.5 mg Oral BID  . ticagrelor  90 mg Oral BID   Continuous Infusions: . dextrose 5 % and 0.9% NaCl 125 mL/hr at 09/26/17 0600  . famotidine (PEPCID) IV Stopped (09/26/17 0036)    Principal Problem:   Hematochezia Active Problems:   Fatty liver disease, nonalcoholic   Celiac disease   Tobacco use   [place]   Hypotension   Morbidly obese (HCC)   LFTs abnormal   Chronic diarrhea, with Celiac disease   Former smoker  Time spent:   CIrwin Brakeman MD, FAAFP [facility] Pager [phone] If 7PM-7AM, please contact night-coverage [email or web address] Password [email or web address] 09/26/2017, 6:54 AM    LOS: 0 days

## 2017-09-26 NOTE — Consult Note (Addendum)
Cardiology Consult    Patient ID: HOLLY IANNACCONE; [id number]; [birth year]   Admit date: 09/25/2017 Date of Consult: 09/26/2017  Primary Care Provider: Joyice Faster, FNP Primary Cardiologist: Followed by Dr. Irish Lack during most recent admission --> plans to follow-up in [place], [place]  Patient Profile    Sharon Lawrence is a 52 y.o. female with past medical history of CAD (s/p recent STEMI on 09/15/2017 with DES to RCA), HLD, celiac disease, chronic anemia, and tobacco use who is being seen today for the evaluation of bleeding in the setting of being on DAPT at the request of Dr. Wynetta Emery.   History of Present Illness    Sharon Lawrence recently presented to [facility] ED on 09/15/2017 for evaluation of sudden onset chest discomfort and her initial EKG showed ST elevation in the inferior leads. Therefore, CODE STEMI was activated and Sharon Lawrence was transferred to [facility] for an emergent cardiac catheterization.  This showed 80% proximal RCA stenosis which appeared to be a ruptured plaque. This was treated with successful placement of a synergy DES and Sharon Lawrence was started on DAPT with ASA and Brilinta.  EF was preserved at 55 to 65% by visual estimate. Sharon Lawrence progressed well during admission and was discharged on 09/17/2017. By review of the discharge summary, it was known at that time that Sharon Lawrence has celiac disease and was scheduled for an endoscopy in 10/2017 but it was recommended to postpone this given her recent MI and the need for uninterrupted DAPT (Hgb was 12.2 when last checked on 09/16/2017).   Sharon Lawrence presented to [facility] ED on 09/25/2017 for evaluation of hematochezia and diarrhea over the past 2 days with over 20 occurrences. Also reported frequent vomiting episodes within the past 24 hours with episodes of hematemesis. Sharon Lawrence reports that Sharon Lawrence was actually having episodes of nausea, vomiting, diarrhea, and hematochezia 2 to 3 months prior to stent placement. Sharon Lawrence has also noticed worsening dizziness and fatigue over the  past 2 days. Denies any recent chest pain, dyspnea on exertion, orthopnea, or PND. Has experienced worsening edema since being started on IVF at the time of admission.   Initial labs show WBC 12.8, Hgb 13.4, platelets 534, Na+ 140, K+ 3.9, and creatinine 0.67.  AST elevated to 123 and ALT 119 (within normal limits in 2015). Magnesium 1.8. Repeat CBC this morning shows hemoglobin has dropped to 11.3. CT of the abdomen shows stable bilateral adrenal lesions and fatty infiltration of the liver with no acute abnormalities noted. EKG shows normal sinus rhythm, heart rate 82, with nonspecific ST abnormalities along the inferior leads which is similar to prior tracings.   Past Medical History:  Diagnosis Date  . B12 deficiency 02/07/2011  . CAD (coronary artery disease)    a. s/p recent STEMI on 09/15/2017 with DES to RCA  . Celiac disease   . Folic acid deficiency 78/04/9560  . Iron deficiency anemia 02/07/2011    Past Surgical History:  Procedure Laterality Date  . CHOLECYSTECTOMY    . CORONARY STENT INTERVENTION Right 09/15/2017   Procedure: CORONARY STENT INTERVENTION;  Surgeon: Jettie Booze, MD;  Location: [facility];  Service: Cardiovascular;  Laterality: Right;  RCA  . KNEE CARTILAGE SURGERY Right   . LEFT HEART CATH AND CORONARY ANGIOGRAPHY N/A 09/15/2017   Procedure: LEFT HEART CATH AND CORONARY ANGIOGRAPHY;  Surgeon: Jettie Booze, MD;  Location: [facility];  Service: Cardiovascular;  Laterality: N/A;  . TONSILLECTOMY  Home Medications:  Prior to Admission medications   Medication Sig Start Date End Date Taking? Authorizing Provider  aspirin 81 MG chewable tablet Chew 1 tablet (81 mg total) by mouth daily. 09/18/17  Yes Cheryln Manly, NP  atorvastatin (LIPITOR) 80 MG tablet Take 1 tablet (80 mg total) by mouth daily at 6 PM. 09/17/17  Yes Reino Bellis B, NP  cholecalciferol (VITAMIN D) 1000 units tablet Take 5,000 Units by mouth daily.   Yes  [provider]  gabapentin (NEURONTIN) 300 MG capsule Take 900 mg by mouth at bedtime.   Yes [provider]  Melatonin 10 MG TABS Take 50 mg by mouth at bedtime.   Yes [provider]  metoprolol tartrate (LOPRESSOR) 25 MG tablet Take 1 tablet (25 mg total) by mouth 2 (two) times daily. 09/17/17  Yes Cheryln Manly, NP  oxyCODONE-acetaminophen (PERCOCET/ROXICET) 5-325 MG tablet Take 1 tablet by mouth every 6 (six) hours as needed for severe pain.   Yes [provider]  ticagrelor (BRILINTA) 90 MG TABS tablet Take 1 tablet (90 mg total) by mouth 2 (two) times daily. 09/17/17  Yes Cheryln Manly, NP  Cyanocobalamin (VITAMIN B-12 IJ) Inject as directed every 30 (thirty) days.     [provider]  nitroGLYCERIN (NITROSTAT) 0.4 MG SL tablet Place 1 tablet (0.4 mg total) under the tongue every 5 (five) minutes as needed for chest pain. 09/17/17   Cheryln Manly, NP  ondansetron (ZOFRAN-ODT) 8 MG disintegrating tablet Take 1 tablet by mouth 3 (three) times daily as needed for nausea.  08/11/17   [provider]  promethazine (PHENERGAN) 25 MG tablet Take 1 tablet by mouth 3 (three) times daily as needed for nausea.  08/11/17   [provider]  ranitidine (ZANTAC) 150 MG tablet Take 1 tablet by mouth daily. 07/30/17   [provider]  sucralfate (CARAFATE) 1 g tablet Take 1 tablet by mouth 2 (two) times daily as needed (upset stomach).  07/30/17   [provider]    Inpatient Medications: Scheduled Meds: . aspirin  81 mg Oral Daily  . atorvastatin  40 mg Oral q1800  . gabapentin  900 mg Oral QHS  . Melatonin  3 mg Oral QHS  . metoprolol tartrate  12.5 mg Oral BID  . ticagrelor  90 mg Oral BID   Continuous Infusions: . dextrose 5 % and 0.9% NaCl 50 mL/hr at 09/26/17 0821  . famotidine (PEPCID) IV Stopped (09/26/17 0036)   PRN Meds: acetaminophen **OR** acetaminophen, nitroGLYCERIN, ondansetron **OR**  ondansetron (ZOFRAN) IV, oxyCODONE-acetaminophen, sucralfate  Allergies:    Allergies  Allergen Reactions  . Erythromycin Anaphylaxis  . Neosporin Original [Bacitracin-Neomycin-Polymyxin] Swelling    Swelling at site of application  . Sulfa Antibiotics Swelling    Sulfa eye drops caused eyes to swell  . Tobrex [Tobramycin] Swelling  . Tramadol Hives  . Penicillins Rash    Has patient had a PCN reaction causing immediate rash, facial/tongue/throat swelling, SOB or lightheadedness with hypotension: Yes Has patient had a PCN reaction causing severe rash involving mucus membranes or skin necrosis: No Has patient had a PCN reaction that required hospitalization: No Has patient had a PCN reaction occurring within the last 10 years: No If all of the above answers are "NO", then may proceed with Cephalosporin use.     Social History:   Social History   Socioeconomic History  . Marital status: Single    Spouse name: Not on file  .  Number of children: Not on file  . Years of education: Not on file  . Highest education level: Not on file  Occupational History  . Not on file  Social Needs  . Financial resource strain: Not on file  . Food insecurity:    Worry: Not on file    Inability: Not on file  . Transportation needs:    Medical: Not on file    Non-medical: Not on file  Tobacco Use  . Smoking status: Current Every Day Smoker    Packs/day: 0.25    Years: 12.00    Pack years: 3.00    Types: Cigarettes  . Smokeless tobacco: Never Used  Substance and Sexual Activity  . Alcohol use: No  . Drug use: No  . Sexual activity: Never  Lifestyle  . Physical activity:    Days per week: Not on file    Minutes per session: Not on file  . Stress: Not on file  Relationships  . Social connections:    Talks on phone: Not on file    Gets together: Not on file    Attends religious service: Not on file    Active member of club or organization: Not on file    Attends meetings of clubs or  organizations: Not on file    Relationship status: Not on file  . Intimate partner violence:    Fear of current or ex partner: Not on file    Emotionally abused: Not on file    Physically abused: Not on file    Forced sexual activity: Not on file  Other Topics Concern  . Not on file  Social History Narrative  . Not on file     Family History:    Family History  Problem Relation Age of Onset  . Cancer Mother   . Hypertension Mother   . Hypertension Father   . Hypothyroidism Father   . Cancer Sister   . CAD Maternal Grandfather   . Cirrhosis Paternal Grandfather   . CAD Paternal Grandmother   . Diabetes Paternal Grandmother   . Hypothyroidism Paternal Grandmother       Review of Systems    General:  No chills, fever, night sweats or weight changes.  Cardiovascular:  No chest pain, dyspnea on exertion, edema, orthopnea, palpitations, paroxysmal nocturnal dyspnea. Dermatological: No rash, lesions/masses Respiratory: No cough, dyspnea Urologic: No hematuria, dysuria Abdominal:   No melena. Positive for nausea, vomiting, diarrhea, and hematochezia.  Neurologic:  No visual changes, wkns, changes in mental status. All other systems reviewed and are otherwise negative except as noted above.  Physical Exam/Data    Vitals:   09/26/17 0400 09/26/17 0500 09/26/17 0600 09/26/17 0730  BP: 110/73 103/70 101/66   Pulse: 67 76 66   Resp: 17 14 17    Temp:  97.8 F (36.6 C)  98.4 F (36.9 C)  TempSrc:  Oral  Oral  SpO2: 97% 97% 97%   Weight:  209 lb 10.5 oz (95.1 kg)    Height:        Intake/Output Summary (Last 24 hours) at 09/26/2017 0833 Last data filed at 09/26/2017 0600 Gross per 24 hour  Intake 1383.33 ml  Output -  Net 1383.33 ml   Filed Weights   09/25/17 0937 09/25/17 1900 09/26/17 0500  Weight: 211 lb (95.7 kg) 210 lb 15.7 oz (95.7 kg) 209 lb 10.5 oz (95.1 kg)   Body mass index is 40.95 kg/m.   General: Pleasant, Caucasian female appearing in NAD  Psych:  Normal affect. Neuro: Alert and oriented X 3. Moves all extremities spontaneously. HEENT: Normal  Neck: Supple without bruits or JVD. Lungs:  Resp regular and unlabored, CTA without wheezing or rales. Heart: RRR no s3, s4, or murmurs. Abdomen: Soft, non-tender, non-distended, BS + x 4.  Extremities: No clubbing or cyanosis. 1+ pitting edema bilaterally. DP/PT/Radials 2+ and equal bilaterally.   EKG:  The EKG was personally reviewed and demonstrates: NSR, HR 82, with nonspecific ST abnormalities along the inferior leads which is similar to prior tracings.  Labs/Studies     Relevant CV Studies:  Cardiac Catheterization: 09/15/2017  Prox RCA lesion is 80% stenosed. It appeared to be a ruptured plaque.  A drug-eluting stent was successfully placed using a STENT SYNERGY DES 4X24.  Post intervention, there is a 0% residual stenosis.  The left ventricular systolic function is normal.  LV end diastolic pressure is low.  The left ventricular ejection fraction is 55-65% by visual estimate.  Severe spasm in the right radial artery, requiring heavy sedation and IA NTG. 4Fr diagnostic and 5 Fr Guide catheter used.   Recommend uninterrupted dual antiplatelet therapy with Aspirin 53m daily and Ticagrelor 940mtwice daily for a minimum of 12 months (ACS - Class I recommendation).   Start high dose statin. Check A1C.  Continue smoking cessation.   Echocardiogram: 01/2016 Study Conclusions  - Left ventricle: The cavity size was normal. Wall thickness was   normal. Systolic function was vigorous. The estimated ejection   fraction was in the range of 65% to 70%. Wall motion was normal;   there were no regional wall motion abnormalities. Left   ventricular diastolic function parameters were normal. - Mitral valve: There was trivial regurgitation. - Right atrium: Central venous pressure (est): 3 mm Hg. - Atrial septum: No defect or patent foramen ovale was identified. - Tricuspid valve:  There was physiologic regurgitation. - Pulmonary arteries: Systolic pressure could not be accurately   estimated. - Pericardium, extracardiac: There was no pericardial effusion.  Impressions:  - Normal LV wall thickness with LVEF 65-70% and normal diastolic   function. Trivial mitral regurgitation.  Laboratory Data:  Chemistry Recent Labs  Lab 09/25/17 1014 09/26/17 0427  NA 140 142  K 3.9 3.9  CL 110 115*  CO2 23 21*  GLUCOSE 92 86  BUN 11 10  CREATININE 0.67 0.79  CALCIUM 7.8* 7.4*  GFRNONAA >60 >60  GFRAA >60 >60  ANIONGAP 7 6    Recent Labs  Lab 09/25/17 1014 09/26/17 0427  PROT 5.5* 4.4*  ALBUMIN 2.3* 1.9*  AST 123* 91*  ALT 119* 91*  ALKPHOS 238* 181*  BILITOT 0.4 0.4   Hematology Recent Labs  Lab 09/25/17 1014 09/25/17 2115 09/26/17 0427  WBC 12.8*  --  8.3  RBC 5.19*  --  4.54  HGB 13.4 12.0 11.3*  HCT 40.7 36.2 35.9*  MCV 78.4  --  79.1  MCH 25.8*  --  24.9*  MCHC 32.9  --  31.5  RDW 18.8*  --  19.1*  PLT 534*  --  487*   Cardiac EnzymesNo results for input(s): TROPONINI in the last 168 hours. No results for input(s): TROPIPOC in the last 168 hours.  BNPNo results for input(s): BNP, PROBNP in the last 168 hours.  DDimer No results for input(s): DDIMER in the last 168 hours.  Radiology/Studies:  Ct Abdomen Pelvis W Contrast  Result Date: 09/25/2017 CLINICAL DATA:  Bloody diarrhea for 2 days EXAM: CT ABDOMEN AND  PELVIS WITH CONTRAST TECHNIQUE: Multidetector CT imaging of the abdomen and pelvis was performed using the standard protocol following bolus administration of intravenous contrast. CONTRAST:  124m ISOVUE-300 IOPAMIDOL (ISOVUE-300) INJECTION 61% COMPARISON:  08/22/2017 FINDINGS: Lower chest: No acute abnormality. Hepatobiliary: Fatty infiltration of the liver is noted. The gallbladder has been surgically removed. Pancreas: Unremarkable. No pancreatic ductal dilatation or surrounding inflammatory changes. Spleen: Normal in size without  focal abnormality. Adrenals/Urinary Tract: Bilateral hypodense adrenal lesions are noted stable from the recent exam consistent with adenomas. Kidneys are well visualized bilaterally without renal calculi or obstructive changes. The bladder is partially decompressed. Stomach/Bowel: Stomach is within normal limits. Appendix appears normal. No evidence of bowel wall thickening, distention, or inflammatory changes. Vascular/Lymphatic: Aortic atherosclerosis. No enlarged abdominal or pelvic lymph nodes. Reproductive: IUD is noted in place. The uterus is otherwise within normal limits. Ovarian cystic changes are noted bilaterally stable from the previous exam. Other: No abdominal wall hernia or abnormality. No abdominopelvic ascites. Musculoskeletal: Degenerative changes of the lumbar spine are noted. IMPRESSION: Stable bilateral adrenal lesions. Fatty infiltration of the liver. No acute abnormality noted. Electronically Signed   By: MInez CatalinaM.D.   On: 09/25/2017 15:10     Assessment & Plan    1. CAD - the patient is s/p recent STEMI on 09/15/2017 with DES to RCA. Sharon Lawrence had been having issues with hematochezia and frequent diarrhea prior to stent placement but says this has intensified since being started on DAPT. Hgb was 12.2 when checked last admission, at 11.3 this morning. Says Sharon Lawrence has known internal and external hemorrhoids and was planning to have an endoscopy and colonoscopy in 10/2017 but these studies have been delayed due to her recent ACS event.  - with her stent just being placed last week, DAPT has to be continued at this time. Data has suggested the risk of bleeding is lower on Plavix, therefore will review with Dr. BHarl Bowieto see if switching to Plavix is a possibility. Either way, Sharon Lawrence will require close monitoring of her Hgb throughout the next several months until Sharon Lawrence can undergo a full GI evaluation.  - continue with ASA, Brilinta, BB, and statin therapy (will reduce dosing in the setting of  elevated LFT's).   2. HLD/ Elevated LFT's - FLP during recent admission showed total cholesterol 77, triglycerides 64, HDL 19, and LDL 45.  At goal of LDL less than 70. CT Abdomen shows fatty infiltration of the liver. With her elevated LFT's this admission, will reduce Atorvastatin from 80 mg daily to 40 mg daily. Will need repeat LFT's in 4 weeks.   3. Hematochezia/ Anemia  - Hgb 12.2 on 09/16/2017, at 11.3 this AM. Sharon Lawrence has been started on IV Pepcid and Carafate. Agree with reducing rate of IVF as Sharon Lawrence does have 1+ pitting edema on examination.  - GI has been consulted by the admitting team.   4. Tobacco Use - still smoking 5-6 cigarettes daily. Cessation advised.   For questions or updates, please contact CLarkspurPlease consult [email or web address] for contact info under Cardiology/STEMI.  Signed, BErma Heritage PA-C 09/26/2017, 8:33 AM Pager: [phone]  Attending note Patient seen and discussed with PA SAhmed Prima I agree with her documentation above. 52yo female with history of CAD, admitted with STEMI 09/17/17, received PCI to RCA. Admitted here with hematochezia. Hgb 13 on admit, down to 11. Would not stop DAPT at this time given very recent stent in setting of ACS and high risk of thrombosis. Reasonable  to change brillinta to plavix for somewhat less potent platelet inhibition but still effective at preventing thrombosis. Sharon Lawrence has already received brillinta this AM, will have her take her pm dose and then change to plavix tomorrow AM. Technically with the change Sharon Lawrence will require a loading dose of 366m tomorrow (particularly this close to her stent timing wise), followed by 743mdaily on Sunday. No further cardiolgy recs at this time, we will follow peripherally. Hold lopressor due to soft bp's.   From cardiac standpoint Sharon Lawrence is acceptable for any planned GI procedures.   JoCarlyle DollyD

## 2017-09-26 NOTE — Op Note (Signed)
[facility] Patient Name: Sharon Lawrence Procedure Date: 09/26/2017 4:28 PM MRN: [id number] Date of Birth: 1965-08-28 Attending MD: Hildred Laser , MD CSN: [id number] Age: 52 Admit Type: Outpatient Procedure:                Colonoscopy Indications:              Rectal bleeding, Acute post hemorrhagic anemia Providers:                Hildred Laser, MD, Charlsie Quest. Theda Sers RN, RN,                            Randa Spike, Technician Referring MD:             Irwin Brakeman, MD Medicines:                Propofol per Anesthesia Complications:            No immediate complications. Estimated Blood Loss:     Estimated blood loss: none. Procedure:                Pre-Anesthesia Assessment:                           - Prior to the procedure, a History and Physical                            was performed, and patient medications and                            allergies were reviewed. The patient's tolerance of                            previous anesthesia was also reviewed. The risks                            and benefits of the procedure and the sedation                            options and risks were discussed with the patient.                            All questions were answered, and informed consent                            was obtained. Prior Anticoagulants: The patient                            last took aspirin on the day of the procedure and                            antiplatelet medication on the day of the                            procedure. ASA Grade Assessment: III - A patient  with severe systemic disease. After reviewing the                            risks and benefits, the patient was deemed in                            satisfactory condition to undergo the procedure.                           After obtaining informed consent, the colonoscope                            was passed under direct vision. Throughout the                             procedure, the patient's blood pressure, pulse, and                            oxygen saturations were monitored continuously. The                            [id number]-[id number] ([id number]) scope was introduced through                            the anus and advanced to the the terminal ileum,                            with identification of the appendiceal orifice and                            IC valve. The colonoscopy was performed without                            difficulty. The patient tolerated the procedure                            well. The quality of the bowel preparation was                            good. The terminal ileum, ileocecal valve,                            appendiceal orifice, and rectum were photographed. Scope In: 5:00:14 PM Scope Out: 5:14:36 PM Scope Withdrawal Time: 0 hours 8 minutes 24 seconds  Total Procedure Duration: 0 hours 14 minutes 22 seconds  Findings:      The perianal and digital rectal examinations were normal.      The terminal ileum appeared normal.      Five sessile, non-bleeding polyps were found in the rectum, sigmoid       colon and hepatic flexure. The polyps were small in size.      External hemorrhoids were found during retroflexion. The hemorrhoids       were small. Impression:               -  The examined portion of the ileum was normal.                           - Five small, non-bleeding polyps in the rectum, in                            the sigmoid colon and at the hepatic flexure.                           - External hemorrhoids.                           - No specimens collected. Moderate Sedation:      Per Anesthesia Care Recommendation:           - Return patient to hospital ward for ongoing care.                           - Gluten free diet today.                           - Continue present medications.                           - Anusol HC supp one pr bid for one week.                           - Repeat colonoscopy in 1  year.                           Comment: bleeding secondary to external hemorrhoids. Procedure Code(s):        --- Professional ---                           [phone], Colonoscopy, flexible; diagnostic, including                            collection of specimen(s) by brushing or washing,                            when performed (separate procedure) Diagnosis Code(s):        --- Professional ---                           K64.4, Residual hemorrhoidal skin tags                           K62.1, Rectal polyp                           D12.5, Benign neoplasm of sigmoid colon                           D12.3, Benign neoplasm of transverse colon (hepatic  flexure or splenic flexure)                           K62.5, Hemorrhage of anus and rectum                           D62, Acute posthemorrhagic anemia CPT copyright 2017 American Medical Association. All rights reserved. The codes documented in this report are preliminary and upon coder review may  be revised to meet current compliance requirements. Hildred Laser, MD Hildred Laser, MD 09/26/2017 5:33:52 PM This report has been signed electronically. Number of Addenda: 0

## 2017-09-26 NOTE — Progress Notes (Signed)
Brief colonoscopy note.  Normal terminal ileum. 5 small polyps noted without stigmata of bleeding(hepatic flexure sigmoid colon and rectum) these polyps would be removed in 1 year when able to interrupt antiplatelet therapy. External hemorrhoids without active bleeding but felt to be the source of GI blood loss.

## 2017-09-26 NOTE — Transfer of Care (Signed)
Immediate Anesthesia Transfer of Care Note  Patient: Sharon Lawrence  Procedure(s) Performed: COLONOSCOPY WITH PROPOFOL (N/A )  Patient Location: PACU  Anesthesia Type:MAC  Level of Consciousness: awake, oriented and patient cooperative  Airway & Oxygen Therapy: Patient Spontanous Breathing  Post-op Assessment: Report given to RN, Post -op Vital signs reviewed and stable and Patient moving all extremities  Post vital signs: Reviewed and stable  Last Vitals:  Vitals Value Taken Time  BP    Temp    Pulse    Resp    SpO2      Last Pain:  Vitals:   09/26/17 1655  TempSrc:   PainSc: 0-No pain      Patients Stated Pain Goal: 0 (79/89/21 1941)  Complications: No apparent anesthesia complications

## 2017-09-26 NOTE — Anesthesia Preprocedure Evaluation (Signed)
Anesthesia Evaluation  Patient identified by MRN, date of birth, ID band Patient awake    Reviewed: Allergy & Precautions, H&P , NPO status , Patient's Chart, lab work & pertinent test results, reviewed documented beta blocker date and time   Airway Mallampati: II  TM Distance: >3 FB Neck ROM: full    Dental no notable dental hx.    Pulmonary Current Smoker,    Pulmonary exam normal breath sounds clear to auscultation       Cardiovascular Exercise Tolerance: Good + CAD and + Past MI   Rhythm:regular Rate:Normal     Neuro/Psych negative neurological ROS  negative psych ROS   GI/Hepatic Neg liver ROS,   Endo/Other  negative endocrine ROS  Renal/GU negative Renal ROS  negative genitourinary   Musculoskeletal   Abdominal   Peds  Hematology  (+) anemia ,   Anesthesia Other Findings   Reproductive/Obstetrics negative OB ROS                             Anesthesia Physical Anesthesia Plan  ASA: III  Anesthesia Plan: MAC   Post-op Pain Management:    Induction:   PONV Risk Score and Plan: Propofol infusion and TIVA  Airway Management Planned:   Additional Equipment:   Intra-op Plan:   Post-operative Plan:   Informed Consent: I have reviewed the patients History and Physical, chart, labs and discussed the procedure including the risks, benefits and alternatives for the proposed anesthesia with the patient or authorized representative who has indicated his/her understanding and acceptance.   Dental Advisory Given  Plan Discussed with: CRNA  Anesthesia Plan Comments:         Anesthesia Quick Evaluation

## 2017-09-26 NOTE — Anesthesia Postprocedure Evaluation (Signed)
Anesthesia Post Note  Patient: Sharon Lawrence  Procedure(s) Performed: COLONOSCOPY WITH PROPOFOL (N/A )  Patient location during evaluation: Endoscopy Anesthesia Type: MAC Level of consciousness: awake and alert Pain management: pain level controlled Vital Signs Assessment: post-procedure vital signs reviewed and stable Respiratory status: spontaneous breathing, nonlabored ventilation, respiratory function stable and patient connected to nasal cannula oxygen Cardiovascular status: blood pressure returned to baseline and stable Postop Assessment: no apparent nausea or vomiting Anesthetic complications: no     Last Vitals:  Vitals:   09/26/17 1720 09/26/17 1730  BP: 93/65   Pulse: 69   Resp: 18   Temp: 36.5 C   SpO2: 100% 100%    Last Pain:  Vitals:   09/26/17 1720  TempSrc:   PainSc: 0-No pain                 Jefferson Fuel

## 2017-09-26 NOTE — Consult Note (Addendum)
Referring Provider: Irwin Brakeman, MD Primary Care Physician:  Joyice Faster, FNP Primary Gastroenterologist:  Dr. Laural Golden  Reason for Consultation:   Rectal bleeding.  HPI:   Patient is 52 year old Caucasian female with multiple medical problems which include celiac disease B12 deficiency history of colonic polyps who suffered STEMI 11 days ago treated with DES to RCA who has been maintained on dual antiplatelet therapy who presents with rectal bleeding which started few days ago but yesterday she passed large amount of blood.  She was evaluated in the emergency room and hospitalized for further management. The night she had another bloody bowel movement and also passed clots.  Her hemoglobin has dropped by more than 2 g. Patient reports intermittent nausea vomiting and diarrhea over the last few months.  She states she has lost 15 pounds.  She was actually seen in our office on 09/11/2017 and scheduled to undergo EGD and colonoscopy next month. She says she has 100% compliant with gluten-free diet. She gives history of intermittent heartburn for which she is using ranitidine on as-needed basis.  Similarly she has been using sucralfate on as-needed basis.  She is on low-dose aspirin and ticagrelor does not take other OTC NSAIDs. Last colonoscopy was January 2012 with removal of 2 polyps. She has quit cigarette smoking and does not drink alcohol.     Past Medical History:  Diagnosis Date  . B12 deficiency 02/07/2011  . CAD (coronary artery disease)    a. s/p recent STEMI on 09/15/2017 with DES to RCA  . Celiac disease   . Folic acid deficiency 46/08/5991  . Iron deficiency anemia 02/07/2011    Past Surgical History:  Procedure Laterality Date  . CHOLECYSTECTOMY    . CORONARY STENT INTERVENTION Right 09/15/2017   Procedure: CORONARY STENT INTERVENTION;  Surgeon: Jettie Booze, MD;  Location: [place] [facility];  Service: Cardiovascular;  Laterality: Right;  RCA  . KNEE  CARTILAGE SURGERY Right   . LEFT HEART CATH AND CORONARY ANGIOGRAPHY N/A 09/15/2017   Procedure: LEFT HEART CATH AND CORONARY ANGIOGRAPHY;  Surgeon: Jettie Booze, MD;  Location: [place] [facility];  Service: Cardiovascular;  Laterality: N/A;  . TONSILLECTOMY      Prior to Admission medications   Medication Sig Start Date End Date Taking? Authorizing Provider  aspirin 81 MG chewable tablet Chew 1 tablet (81 mg total) by mouth daily. 09/18/17  Yes Cheryln Manly, NP  atorvastatin (LIPITOR) 80 MG tablet Take 1 tablet (80 mg total) by mouth daily at 6 PM. 09/17/17  Yes Reino Bellis B, NP  cholecalciferol (VITAMIN D) 1000 units tablet Take 5,000 Units by mouth daily.   Yes [provider]  gabapentin (NEURONTIN) 300 MG capsule Take 900 mg by mouth at bedtime.   Yes [provider]  Melatonin 10 MG TABS Take 50 mg by mouth at bedtime.   Yes [provider]  metoprolol tartrate (LOPRESSOR) 25 MG tablet Take 1 tablet (25 mg total) by mouth 2 (two) times daily. 09/17/17  Yes Cheryln Manly, NP  oxyCODONE-acetaminophen (PERCOCET/ROXICET) 5-325 MG tablet Take 1 tablet by mouth every 6 (six) hours as needed for severe pain.   Yes [provider]  ticagrelor (BRILINTA) 90 MG TABS tablet Take 1 tablet (90 mg total) by mouth 2 (two) times daily. 09/17/17  Yes Cheryln Manly, NP  Cyanocobalamin (VITAMIN B-12 IJ) Inject as directed every 30 (thirty) days.     [provider]  nitroGLYCERIN (NITROSTAT) 0.4 MG  SL tablet Place 1 tablet (0.4 mg total) under the tongue every 5 (five) minutes as needed for chest pain. 09/17/17   Cheryln Manly, NP  ondansetron (ZOFRAN-ODT) 8 MG disintegrating tablet Take 1 tablet by mouth 3 (three) times daily as needed for nausea.  08/11/17   [provider]  promethazine (PHENERGAN) 25 MG tablet Take 1 tablet by mouth 3 (three) times daily as needed for nausea.  08/11/17   [provider]  ranitidine  (ZANTAC) 150 MG tablet Take 1 tablet by mouth daily. 07/30/17   [provider]  sucralfate (CARAFATE) 1 g tablet Take 1 tablet by mouth 2 (two) times daily as needed (upset stomach).  07/30/17   [provider]    Current Facility-Administered Medications  Medication Dose Route Frequency Provider Last Rate Last Dose  . acetaminophen (TYLENOL) tablet 650 mg  650 mg Oral Q6H PRN Paticia Stack, MD       Or  . acetaminophen (TYLENOL) suppository 650 mg  650 mg Rectal Q6H PRN Paticia Stack, MD      . aspirin chewable tablet 81 mg  81 mg Oral Daily Paticia Stack, MD   81 mg at 09/26/17 0915  . atorvastatin (LIPITOR) tablet 40 mg  40 mg Oral q1800 Strader, [place] M, PA-C      . [START ON 09/27/2017] clopidogrel (PLAVIX) tablet 300 mg  300 mg Oral Daily Arnoldo Lenis, MD      . Derrill Memo ON 09/28/2017] clopidogrel (PLAVIX) tablet 75 mg  75 mg Oral Daily Arnoldo Lenis, MD      . dextrose 5 %-0.9 % sodium chloride infusion   Intravenous Continuous Johnson, Clanford L, MD 50 mL/hr at 09/26/17 0821    . famotidine (PEPCID) IVPB 20 mg premix  20 mg Intravenous Q12H Paticia Stack, MD   Stopped at 09/26/17 0945  . gabapentin (NEURONTIN) capsule 900 mg  900 mg Oral QHS Paticia Stack, MD   900 mg at 09/25/17 2228  . Melatonin TABS 3 mg  3 mg Oral QHS Johnson, Clanford L, MD      . nitroGLYCERIN (NITROSTAT) SL tablet 0.4 mg  0.4 mg Sublingual Q5 min PRN Paticia Stack, MD      . ondansetron [facility]) tablet 4 mg  4 mg Oral Q6H PRN Paticia Stack, MD   4 mg at 09/25/17 2229   Or  . ondansetron (ZOFRAN) injection 4 mg  4 mg Intravenous Q6H PRN Paticia Stack, MD      . oxyCODONE-acetaminophen (PERCOCET/ROXICET) 5-325 MG per tablet 1 tablet  1 tablet Oral Q6H PRN Paticia Stack, MD      . sucralfate (CARAFATE) tablet 1 g  1 g Oral BID PRN Paticia Stack, MD      . ticagrelor [facility]) tablet 90 mg  90 mg Oral BID Arnoldo Lenis, MD   90 mg at 09/26/17 0914    Allergies as of 09/25/2017 -  Review Complete 09/25/2017  Allergen Reaction Noted  . Erythromycin Anaphylaxis 06/03/2012  . Neosporin original [bacitracin-neomycin-polymyxin] Swelling 06/03/2012  . Sulfa antibiotics Swelling 06/03/2012  . Tobrex [tobramycin] Swelling 09/15/2016  . Tramadol Hives 06/22/2013  . Penicillins Rash 06/03/2012    Family History  Problem Relation Age of Onset  . Cancer Mother   . Hypertension Mother   . Hypertension Father   . Hypothyroidism Father   . Cancer Sister   . CAD Maternal Grandfather   . Cirrhosis Paternal Grandfather   .  CAD Paternal Grandmother   . Diabetes Paternal Grandmother   . Hypothyroidism Paternal Grandmother     Social History   Socioeconomic History  . Marital status: Single    Spouse name: Not on file  . Number of children: Not on file  . Years of education: Not on file  . Highest education level: Not on file  Occupational History  . Not on file  Social Needs  . Financial resource strain: Not on file  . Food insecurity:    Worry: Not on file    Inability: Not on file  . Transportation needs:    Medical: Not on file    Non-medical: Not on file  Tobacco Use  . Smoking status: Current Every Day Smoker    Packs/day: 0.25    Years: 12.00    Pack years: 3.00    Types: Cigarettes  . Smokeless tobacco: Never Used  Substance and Sexual Activity  . Alcohol use: No  . Drug use: No  . Sexual activity: Never  Lifestyle  . Physical activity:    Days per week: Not on file    Minutes per session: Not on file  . Stress: Not on file  Relationships  . Social connections:    Talks on phone: Not on file    Gets together: Not on file    Attends religious service: Not on file    Active member of club or organization: Not on file    Attends meetings of clubs or organizations: Not on file    Relationship status: Not on file  . Intimate partner violence:    Fear of current or ex partner: Not on file    Emotionally abused: Not on file    Physically  abused: Not on file    Forced sexual activity: Not on file  Other Topics Concern  . Not on file  Social History Narrative  . Not on file    Review of Systems: See HPI, otherwise normal ROS  Physical Exam: Temp:  [97.8 F (36.6 C)-98.9 F (37.2 C)] 98.4 F (36.9 C) (07/26 0730) Pulse Rate:  [58-93] 65 (07/26 1000) Resp:  [14-26] 19 (07/26 1000) BP: (92-111)/(57-73) 105/68 (07/26 1000) SpO2:  [95 %-100 %] 97 % (07/26 1006) Weight:  [209 lb 10.5 oz (95.1 kg)-210 lb 15.7 oz (95.7 kg)] 209 lb 10.5 oz (95.1 kg) (07/26 0500)   Well-developed obese Caucasian female in NAD. She is alert and in no acute distress. Conjunctiva is pink and sclerae nonicteric. Oropharyngeal mucosa is normal. No neck masses or thyromegaly noted. Cardiac exam with regular rhythm normal S1 and S2.  No murmur or gallop noted. Auscultation lungs reveal vesicular breath sounds bilaterally. Abdomen is full.  Bowel sounds are normal.  On palpation it soft with mild midepigastric tenderness.  No organomegaly or masses. Trace edema around ankles.   Lab Results: Recent Labs    09/25/17 1014 09/25/17 2115 09/26/17 0427  WBC 12.8*  --  8.3  HGB 13.4 12.0 11.3*  HCT 40.7 36.2 35.9*  PLT 534*  --  487*   BMET Recent Labs    09/25/17 1014 09/26/17 0427  NA 140 142  K 3.9 3.9  CL 110 115*  CO2 23 21*  GLUCOSE 92 86  BUN 11 10  CREATININE 0.67 0.79  CALCIUM 7.8* 7.4*   LFT Recent Labs    09/26/17 0427  PROT 4.4*  ALBUMIN 1.9*  AST 91*  ALT 91*  ALKPHOS 181*  BILITOT 0.4  PT/INR Recent Labs    09/25/17 1014  LABPROT 14.5  INR 1.14   Hepatitis Panel No results for input(s): HEPBSAG, HCVAB, HEPAIGM, HEPBIGM in the last 72 hours.  Studies/Results: Ct Abdomen Pelvis W Contrast  Result Date: 09/25/2017 CLINICAL DATA:  Bloody diarrhea for 2 days EXAM: CT ABDOMEN AND PELVIS WITH CONTRAST TECHNIQUE: Multidetector CT imaging of the abdomen and pelvis was performed using the standard protocol  following bolus administration of intravenous contrast. CONTRAST:  148m ISOVUE-300 IOPAMIDOL (ISOVUE-300) INJECTION 61% COMPARISON:  08/22/2017 FINDINGS: Lower chest: No acute abnormality. Hepatobiliary: Fatty infiltration of the liver is noted. The gallbladder has been surgically removed. Pancreas: Unremarkable. No pancreatic ductal dilatation or surrounding inflammatory changes. Spleen: Normal in size without focal abnormality. Adrenals/Urinary Tract: Bilateral hypodense adrenal lesions are noted stable from the recent exam consistent with adenomas. Kidneys are well visualized bilaterally without renal calculi or obstructive changes. The bladder is partially decompressed. Stomach/Bowel: Stomach is within normal limits. Appendix appears normal. No evidence of bowel wall thickening, distention, or inflammatory changes. Vascular/Lymphatic: Aortic atherosclerosis. No enlarged abdominal or pelvic lymph nodes. Reproductive: IUD is noted in place. The uterus is otherwise within normal limits. Ovarian cystic changes are noted bilaterally stable from the previous exam. Other: No abdominal wall hernia or abnormality. No abdominopelvic ascites. Musculoskeletal: Degenerative changes of the lumbar spine are noted. IMPRESSION: Stable bilateral adrenal lesions. Fatty infiltration of the liver. No acute abnormality noted. Electronically Signed   By: MInez CatalinaM.D.   On: 09/25/2017 15:10    Assessment; Patient is 52year old Caucasian female who presents with large volume painless hematochezia.  Her hemoglobin is dropped by more than 2 g.  Patient is on dual antiplatelet therapy because she sustained STEMI 11 days ago and underwent placement of DES to right coronary artery.  Therefore patient cannot come off antiplatelet therapy.  Differential diagnosis includes hemorrhoidal bleeding versus other.  Personal history significant for history of polyps and family history significant for rectal carcinoma in her sister and  multiple adenomas in her mother. Given patient's presentation and need for DAPT observation would not be recommended.  She has well preserved hepatic function and overall risk of colonoscopy would be minimal. I have reviewed procedure risks with the patient and both her parents were at bedside and they are all in agreement.  I will also talk with Dr. JCarlyle Dollywho feels patient is stable from cardiac standpoint and risk is acceptable.  History of intermittent nausea vomiting and diarrhea which she has had for few months.  The symptoms are possibly related to noncompliance with gluten-free diet.  If the symptoms persist despite improvement in dietary compliance will need further work-up.  Elevated transaminases most likely secondary to fatty liver.  No evidence of cirrhosis on CT.  Further evaluation on an outpatient basis.   Recommendations;  Patient will be prepped with GoLYTELY and undergo diagnostic colonoscopy under monitored anesthesia care later today. Continue antiplatelet therapy as recommended by Dr. BHarl Bowie Begin pantoprazole 40 mg p.o. Daily. Discontinue IV famotidine on 09/27/2017   LOS: 0 days   Braxley Balandran  09/26/2017, 10:41 AM

## 2017-09-27 ENCOUNTER — Encounter (HOSPITAL_COMMUNITY): Payer: Self-pay | Admitting: Family Medicine

## 2017-09-27 DIAGNOSIS — K529 Noninfective gastroenteritis and colitis, unspecified: Secondary | ICD-10-CM | POA: Diagnosis not present

## 2017-09-27 DIAGNOSIS — K760 Fatty (change of) liver, not elsewhere classified: Secondary | ICD-10-CM | POA: Diagnosis not present

## 2017-09-27 DIAGNOSIS — Z720 Tobacco use: Secondary | ICD-10-CM

## 2017-09-27 DIAGNOSIS — K900 Celiac disease: Secondary | ICD-10-CM | POA: Diagnosis not present

## 2017-09-27 DIAGNOSIS — K921 Melena: Secondary | ICD-10-CM | POA: Diagnosis not present

## 2017-09-27 LAB — CBC
HCT: 38.9 % (ref 36.0–46.0)
Hemoglobin: 12.2 g/dL (ref 12.0–15.0)
MCH: 24.7 pg — ABNORMAL LOW (ref 26.0–34.0)
MCHC: 31.4 g/dL (ref 30.0–36.0)
MCV: 78.9 fL (ref 78.0–100.0)
Platelets: 486 10*3/uL — ABNORMAL HIGH (ref 150–400)
RBC: 4.93 MIL/uL (ref 3.87–5.11)
RDW: 19.2 % — ABNORMAL HIGH (ref 11.5–15.5)
WBC: 8.2 10*3/uL (ref 4.0–10.5)

## 2017-09-27 LAB — BASIC METABOLIC PANEL
Anion gap: 6 (ref 5–15)
BUN: 9 mg/dL (ref 6–20)
CO2: 22 mmol/L (ref 22–32)
Calcium: 7.7 mg/dL — ABNORMAL LOW (ref 8.9–10.3)
Chloride: 112 mmol/L — ABNORMAL HIGH (ref 98–111)
Creatinine, Ser: 0.61 mg/dL (ref 0.44–1.00)
GFR calc Af Amer: >60 mL/min (ref >60)
GFR calc non Af Amer: >60 mL/min (ref >60)
Glucose, Bld: 99 mg/dL (ref 70–99)
Potassium: 3.5 mmol/L (ref 3.5–5.1)
Sodium: 140 mmol/L (ref 135–145)

## 2017-09-27 LAB — MAGNESIUM: Magnesium: 1.8 mg/dL (ref 1.7–2.4)

## 2017-09-27 MED ORDER — ATORVASTATIN CALCIUM 40 MG PO TABS: 40.0000 mg | ORAL_TABLET | Freq: Every day | ORAL | 0 refills | Status: DC

## 2017-09-27 MED ORDER — HYDROCORTISONE ACETATE 25 MG RE SUPP: 25.0000 mg | Freq: Two times a day (BID) | RECTAL | 0 refills | Status: AC

## 2017-09-27 MED ORDER — PANTOPRAZOLE SODIUM 40 MG PO TBEC: 40.0000 mg | DELAYED_RELEASE_TABLET | Freq: Every day | ORAL | 0 refills | Status: DC

## 2017-09-27 MED ORDER — CLOPIDOGREL BISULFATE 75 MG PO TABS: 75.0000 mg | ORAL_TABLET | Freq: Every day | ORAL | 1 refills | Status: DC

## 2017-09-27 NOTE — Anesthesia Postprocedure Evaluation (Signed)
Anesthesia Post Note  Patient: Sharon Lawrence  Procedure(s) Performed: COLONOSCOPY WITH PROPOFOL (N/A )  Patient location during evaluation: Nursing Unit Anesthesia Type: MAC Level of consciousness: awake and alert and patient cooperative Pain management: pain level controlled Vital Signs Assessment: post-procedure vital signs reviewed and stable Respiratory status: spontaneous breathing, nonlabored ventilation and respiratory function stable Cardiovascular status: blood pressure returned to baseline Postop Assessment: no apparent nausea or vomiting Anesthetic complications: no     Last Vitals:  Vitals:   09/26/17 2133 09/27/17 0528  BP: (!) 89/66 (!) 103/52  Pulse: 77 69  Resp: 18 18  Temp: 36.8 C 36.8 C  SpO2: 98% 98%    Last Pain:  Vitals:   09/27/17 0802  TempSrc:   PainSc: 0-No pain                 Yehudit Fulginiti J

## 2017-09-27 NOTE — Discharge Summary (Addendum)
Physician Discharge Summary  Sharon Lawrence MOQ:[id number] DOB: [birth year] DOA: 09/25/2017  PCP: Sharon Faster, FNP Cardiologist: Sharon Lawrence   Admit date: 09/25/2017 Discharge date: 09/27/2017  Admitted From: Home  Disposition: Home   Recommendations for Outpatient Follow-up:  1. Follow up with PCP in 1 weeks 2. Please obtain BMP/CBC in 1 week 3. Follow up with cardiologist in 2 weeks to discuss metoprolol 4. Follow up with Sharon Lawrence as scheduled.   Instructions to patient: Stop taking brilinta and start taking the Plavix instead per your heart doctors.   Please stop smoking.   Use the suppositories twice per day for next 6 days. We are having you hold your metoprolol for now due to low blood pressure. Follow up with your cardiologist to see if they want you to restart it later. Your cholesterol medication (atorvastatin) has been reduced to 40 mg by your [facility] team.     Discharge Condition: STABLE   CODE STATUS: FULL    Brief Hospitalization Summary: Please see all hospital notes, images, labs for full details of the hospitalization.  Brief Admission Hx: Sharon Lawrence a 52 y.o.femalewith medical history significant ofceliac disease with chronic diarrhea and rectal bleeding (every time she has diarrhea, she will see drops of fresh blood on stool), morbid obese with fatty liver, former smoker, who just had a STEMI 10 days ago now s/p DES to RCA by Dr. Irish [facility] at [facility], now p/w dizziness and generalized weakness. She stated that even on the day she started Brilinta and aspirin, she still had rectal bleeding (drops of blood by then) and she told cardiologist about that. Given her bleeding features have not changed and she is required to take non-stop DAPT for 12 months.  She is admitted for rectal bleeding.   MDM/Assessment & Plan:   1. Rectal bleeding - Pt has only had a small drop in hemoglobin, she remains on DAPT at the strong urgence of the cardiology team.  Pt was seen  by GI and had colonoscopy done. She was noted to have polyps (not biopsied) and hemorrhoids which were thought to be cause of bleeding.  She was started on anusol [facility] suppositories BID x 1 week.  Pt to follow up with GI for repeat colonoscopy in 1 year for biopsies at that time. 2. CAD s/p recent STEMI - DES to RCA, she is on DAPT per cardiology team and that has been continued. Lopressor was held by cardiology due to soft BPs.  Pt was taken off brilinta and loaded with plavix which she will continue instead of brilinta. She will continue aspirin.  Atorvastatin was reduced to 40 mg daily due to elevated liver enzymes.  Pt was encouraged to follow up with cardiology in 2 weeks for recheck.   3. Hepatic steatosis - Pt has elevated liver enzymes consistent with chronic fatty liver disease. 4. Celiac Disease /Chronic diarrhea - She is followed by Sharon Lawrence with the GI clinic for this and [facility] Lawrence.   5. Tobacco abuse - Pt will be counseled to please stop using all tobacco products, especially given her recent STEMI.  6. Thrombocytosis - question if this is reactive given recent medical problems.  It has been trending down.  Recheck with PCP next week.     DVT prophylaxis: SCDs Code Status: Full  Family Communication: patient Disposition Plan: Home when medically stabilized  Consultants:  Cardiology  GI   Discharge Diagnoses:  Principal Problem:   Hematochezia Active Problems:   Fatty  liver disease, nonalcoholic   Celiac disease   Tobacco use   STEMI (ST elevation myocardial infarction) (Sharon Lawrence), 09/15/17   Hypotension   Morbidly obese (Sharon Lawrence)   LFTs abnormal   Chronic diarrhea, with Celiac disease  Discharge Instructions: Discharge Instructions    Call MD for:  difficulty breathing, headache or visual disturbances   Complete by:  As directed    Call MD for:  extreme fatigue   Complete by:  As directed    Call MD for:  persistant dizziness or light-headedness   Complete by:  As  directed    Call MD for:  persistant nausea and vomiting   Complete by:  As directed    Call MD for:  severe uncontrolled pain   Complete by:  As directed    Increase activity slowly   Complete by:  As directed      Allergies as of 09/27/2017      Reactions   Erythromycin Anaphylaxis   Neosporin Original [bacitracin-neomycin-polymyxin] Swelling   Swelling at site of application   Sulfa Antibiotics Swelling   Sulfa eye drops caused eyes to swell   Tobrex [tobramycin] Swelling   Tramadol Hives   Penicillins Rash   Has patient had a PCN reaction causing immediate rash, facial/tongue/throat swelling, SOB or lightheadedness with hypotension: Yes Has patient had a PCN reaction causing severe rash involving mucus membranes or skin necrosis: No Has patient had a PCN reaction that required hospitalization: No Has patient had a PCN reaction occurring within the last 10 years: No If all of the above answers are "NO", then may proceed with Cephalosporin use.      Medication List    STOP taking these medications   metoprolol tartrate 25 MG tablet Commonly known as:  LOPRESSOR   ranitidine 150 MG tablet Commonly known as:  ZANTAC   ticagrelor 90 MG Tabs tablet Commonly known as:  BRILINTA     TAKE these medications   aspirin 81 MG chewable tablet Chew 1 tablet (81 mg total) by mouth daily.   atorvastatin 40 MG tablet Commonly known as:  LIPITOR Take 1 tablet (40 mg total) by mouth daily at 6 PM. What changed:    medication strength  how much to take   cholecalciferol 1000 units tablet Commonly known as:  VITAMIN D Take 5,000 Units by mouth daily.   clopidogrel 75 MG tablet Commonly known as:  PLAVIX Take 1 tablet (75 mg total) by mouth daily. Start taking on:  09/28/2017   gabapentin 300 MG capsule Commonly known as:  NEURONTIN Take 900 mg by mouth at bedtime.   hydrocortisone 25 MG suppository Commonly known as:  ANUSOL-HC Place 1 suppository (25 mg total)  rectally 2 (two) times daily for 6 days.   Melatonin 10 MG Tabs Take 50 mg by mouth at bedtime.   nitroGLYCERIN 0.4 MG SL tablet Commonly known as:  NITROSTAT Place 1 tablet (0.4 mg total) under the tongue every 5 (five) minutes as needed for chest pain.   ondansetron 8 MG disintegrating tablet Commonly known as:  ZOFRAN-ODT Take 1 tablet by mouth 3 (three) times daily as needed for nausea.   oxyCODONE-acetaminophen 5-325 MG tablet Commonly known as:  PERCOCET/ROXICET Take 1 tablet by mouth every 6 (six) hours as needed for severe pain.   pantoprazole 40 MG tablet Commonly known as:  PROTONIX Take 1 tablet (40 mg total) by mouth daily before breakfast. Start taking on:  09/28/2017   promethazine 25 MG tablet Commonly known  as:  PHENERGAN Take 1 tablet by mouth 3 (three) times daily as needed for nausea.   sucralfate 1 g tablet Commonly known as:  CARAFATE Take 1 tablet by mouth 2 (two) times daily as needed (upset stomach).   VITAMIN B-12 IJ Inject as directed every 30 (thirty) days.       Allergies  Allergen Reactions  . Erythromycin Anaphylaxis  . Neosporin Original [Bacitracin-Neomycin-Polymyxin] Swelling    Swelling at site of application  . Sulfa Antibiotics Swelling    Sulfa eye drops caused eyes to swell  . Tobrex [Tobramycin] Swelling  . Tramadol Hives  . Penicillins Rash    Has patient had a PCN reaction causing immediate rash, facial/tongue/throat swelling, SOB or lightheadedness with hypotension: Yes Has patient had a PCN reaction causing severe rash involving mucus membranes or skin necrosis: No Has patient had a PCN reaction that required hospitalization: No Has patient had a PCN reaction occurring within the last 10 years: No If all of the above answers are "NO", then may proceed with Cephalosporin use.    Allergies as of 09/27/2017      Reactions   Erythromycin Anaphylaxis   Neosporin Original [bacitracin-neomycin-polymyxin] Swelling   Swelling at  site of application   Sulfa Antibiotics Swelling   Sulfa eye drops caused eyes to swell   Tobrex [tobramycin] Swelling   Tramadol Hives   Penicillins Rash   Has patient had a PCN reaction causing immediate rash, facial/tongue/throat swelling, SOB or lightheadedness with hypotension: Yes Has patient had a PCN reaction causing severe rash involving mucus membranes or skin necrosis: No Has patient had a PCN reaction that required hospitalization: No Has patient had a PCN reaction occurring within the last 10 years: No If all of the above answers are "NO", then may proceed with Cephalosporin use.      Medication List    STOP taking these medications   metoprolol tartrate 25 MG tablet Commonly known as:  LOPRESSOR   ranitidine 150 MG tablet Commonly known as:  ZANTAC   ticagrelor 90 MG Tabs tablet Commonly known as:  BRILINTA     TAKE these medications   aspirin 81 MG chewable tablet Chew 1 tablet (81 mg total) by mouth daily.   atorvastatin 40 MG tablet Commonly known as:  LIPITOR Take 1 tablet (40 mg total) by mouth daily at 6 PM. What changed:    medication strength  how much to take   cholecalciferol 1000 units tablet Commonly known as:  VITAMIN D Take 5,000 Units by mouth daily.   clopidogrel 75 MG tablet Commonly known as:  PLAVIX Take 1 tablet (75 mg total) by mouth daily. Start taking on:  09/28/2017   gabapentin 300 MG capsule Commonly known as:  NEURONTIN Take 900 mg by mouth at bedtime.   hydrocortisone 25 MG suppository Commonly known as:  ANUSOL-HC Place 1 suppository (25 mg total) rectally 2 (two) times daily for 6 days.   Melatonin 10 MG Tabs Take 50 mg by mouth at bedtime.   nitroGLYCERIN 0.4 MG SL tablet Commonly known as:  NITROSTAT Place 1 tablet (0.4 mg total) under the tongue every 5 (five) minutes as needed for chest pain.   ondansetron 8 MG disintegrating tablet Commonly known as:  ZOFRAN-ODT Take 1 tablet by mouth 3 (three) times  daily as needed for nausea.   oxyCODONE-acetaminophen 5-325 MG tablet Commonly known as:  PERCOCET/ROXICET Take 1 tablet by mouth every 6 (six) hours as needed for severe pain.  pantoprazole 40 MG tablet Commonly known as:  PROTONIX Take 1 tablet (40 mg total) by mouth daily before breakfast. Start taking on:  09/28/2017   promethazine 25 MG tablet Commonly known as:  PHENERGAN Take 1 tablet by mouth 3 (three) times daily as needed for nausea.   sucralfate 1 g tablet Commonly known as:  CARAFATE Take 1 tablet by mouth 2 (two) times daily as needed (upset stomach).   VITAMIN B-12 IJ Inject as directed every 30 (thirty) days.       Procedures/Studies: Ct Abdomen Pelvis W Contrast  Result Date: 09/25/2017 CLINICAL DATA:  Bloody diarrhea for 2 days EXAM: CT ABDOMEN AND PELVIS WITH CONTRAST TECHNIQUE: Multidetector CT imaging of the abdomen and pelvis was performed using the standard protocol following bolus administration of intravenous contrast. CONTRAST:  147m ISOVUE-300 IOPAMIDOL (ISOVUE-300) INJECTION 61% COMPARISON:  08/22/2017 FINDINGS: Lower chest: No acute abnormality. Hepatobiliary: Fatty infiltration of the liver is noted. The gallbladder has been surgically removed. Pancreas: Unremarkable. No pancreatic ductal dilatation or surrounding inflammatory changes. Spleen: Normal in size without focal abnormality. Adrenals/Urinary Tract: Bilateral hypodense adrenal lesions are noted stable from the recent exam consistent with adenomas. Kidneys are well visualized bilaterally without renal calculi or obstructive changes. The bladder is partially decompressed. Stomach/Bowel: Stomach is within normal limits. Appendix appears normal. No evidence of bowel wall thickening, distention, or inflammatory changes. Vascular/Lymphatic: Aortic atherosclerosis. No enlarged abdominal or pelvic lymph nodes. Reproductive: IUD is noted in place. The uterus is otherwise within normal limits. Ovarian cystic  changes are noted bilaterally stable from the previous exam. Other: No abdominal wall hernia or abnormality. No abdominopelvic ascites. Musculoskeletal: Degenerative changes of the lumbar spine are noted. IMPRESSION: Stable bilateral adrenal lesions. Fatty infiltration of the liver. No acute abnormality noted. Electronically Signed   By: MInez CatalinaM.D.   On: 09/25/2017 15:10   Dg Chest Port 1 View  Result Date: 09/15/2017 CLINICAL DATA:  Chest pain with shortness of breath EXAM: PORTABLE CHEST 1 VIEW COMPARISON:  November 21, 2009 FINDINGS: Lungs are clear. Heart size and pulmonary vascularity are normal. No adenopathy. No pneumothorax. No bone lesions. IMPRESSION: No edema or consolidation. Electronically Signed   By: WLowella GripIII M.D.   On: 09/15/2017 11:36     Subjective: Pt says she feels much better, she has had no further bleeding.  She understands that she is to take plavix now and to stop the brilinta.   Discharge Exam: Vitals:   09/26/17 2133 09/27/17 0528  BP: (!) 89/66 (!) 103/52  Pulse: 77 69  Resp: 18 18  Temp: 98.3 F (36.8 C) 98.3 F (36.8 C)  SpO2: 98% 98%   Vitals:   09/26/17 1730 09/26/17 1741 09/26/17 2133 09/27/17 0528  BP:  103/62 (!) 89/66 (!) 103/52  Pulse:  71 77 69  Resp:  18 18 18   Temp:   98.3 F (36.8 C) 98.3 F (36.8 C)  TempSrc:   Oral Oral  SpO2: 100% 100% 98% 98%  Weight:      Height:       General exam: obese female, awake, alert, NAD, cooperative.  Respiratory system: Clear. No increased work of breathing. Cardiovascular system: S1 & S2 heard, RRR. No JVD, murmurs, gallops.  Gastrointestinal system: Abdomen is nondistended, soft and nontender. Normal bowel sounds heard. Mild hepatomegaly.  Central nervous system: Alert and oriented. No focal neurological deficits. Extremities: no Cyanosis or clubbing.   The results of significant diagnostics from this hospitalization (including imaging, microbiology,  ancillary and laboratory) are  listed below for reference.     Microbiology: No results found for this or any previous visit (from the past 240 hour(s)).   Labs: BNP (last 3 results) No results for input(s): BNP in the last 8760 hours. Basic Metabolic Panel: Recent Labs  Lab 09/25/17 1014 09/26/17 0427 09/27/17 0709  NA 140 142 140  K 3.9 3.9 3.5  CL 110 115* 112*  CO2 23 21* 22  GLUCOSE 92 86 99  BUN 11 10 9   CREATININE 0.67 0.79 0.61  CALCIUM 7.8* 7.4* 7.7*  MG 1.8  --  1.8   Liver Function Tests: Recent Labs  Lab 09/25/17 1014 09/26/17 0427  AST 123* 91*  ALT 119* 91*  ALKPHOS 238* 181*  BILITOT 0.4 0.4  PROT 5.5* 4.4*  ALBUMIN 2.3* 1.9*   No results for input(s): LIPASE, AMYLASE in the last 168 hours. No results for input(s): AMMONIA in the last 168 hours. CBC: Recent Labs  Lab 09/25/17 1014 09/25/17 2115 09/26/17 0427 09/27/17 0709  WBC 12.8*  --  8.3 8.2  NEUTROABS 8.1*  --   --   --   HGB 13.4 12.0 11.3* 12.2  HCT 40.7 36.2 35.9* 38.9  MCV 78.4  --  79.1 78.9  PLT 534*  --  487* 486*   Cardiac Enzymes: No results for input(s): CKTOTAL, CKMB, CKMBINDEX, TROPONINI in the last 168 hours. BNP: Invalid input(s): POCBNP CBG: No results for input(s): GLUCAP in the last 168 hours. D-Dimer No results for input(s): DDIMER in the last 72 hours. Hgb A1c No results for input(s): HGBA1C in the last 72 hours. Lipid Profile No results for input(s): CHOL, HDL, LDLCALC, TRIG, CHOLHDL, LDLDIRECT in the last 72 hours. Thyroid function studies No results for input(s): TSH, T4TOTAL, T3FREE, THYROIDAB in the last 72 hours.  Invalid input(s): FREET3 Anemia work up No results for input(s): VITAMINB12, FOLATE, FERRITIN, TIBC, IRON, RETICCTPCT in the last 72 hours. Urinalysis No results found for: COLORURINE, APPEARANCEUR, LABSPEC, [place], GLUCOSEU, HGBUR, BILIRUBINUR, KETONESUR, PROTEINUR, UROBILINOGEN, NITRITE, LEUKOCYTESUR Sepsis Labs Invalid input(s): PROCALCITONIN,  WBC,   LACTICIDVEN Microbiology No results found for this or any previous visit (from the past 240 hour(s)).  Time coordinating discharge: 31 Mins  SIGNED:  Irwin Brakeman, MD  [facility] 09/27/2017, 9:55 AM Pager [phone]  If 7PM-7AM, please contact night-coverage [email or web address] Password [email or web address]

## 2017-09-27 NOTE — Addendum Note (Signed)
Addendum  created 09/27/17 1146 by Charmaine Downs, CRNA   Sign clinical note

## 2017-09-27 NOTE — Progress Notes (Signed)
IV removed, 2x2 gauze and paper tape applied to site, patient tolerated well.  Reviewed AVS with patient who verbalized understanding.  Patient transported home by father.

## 2017-10-01 ENCOUNTER — Encounter (HOSPITAL_COMMUNITY): Payer: Self-pay | Admitting: Internal Medicine

## 2017-10-09 ENCOUNTER — Encounter (INDEPENDENT_AMBULATORY_CARE_PROVIDER_SITE_OTHER): Payer: Self-pay

## 2017-10-10 ENCOUNTER — Encounter: Payer: Self-pay | Admitting: Student

## 2017-10-10 ENCOUNTER — Ambulatory Visit (INDEPENDENT_AMBULATORY_CARE_PROVIDER_SITE_OTHER): Payer: 59 | Admitting: Student

## 2017-10-10 VITALS — BP 120/80 | HR 94 | Ht 60.0 in | Wt 208.0 lb

## 2017-10-10 DIAGNOSIS — I213 ST elevation (STEMI) myocardial infarction of unspecified site: Secondary | ICD-10-CM

## 2017-10-10 DIAGNOSIS — E785 Hyperlipidemia, unspecified: Secondary | ICD-10-CM

## 2017-10-10 DIAGNOSIS — D649 Anemia, unspecified: Secondary | ICD-10-CM

## 2017-10-10 DIAGNOSIS — I2510 Atherosclerotic heart disease of native coronary artery without angina pectoris: Secondary | ICD-10-CM

## 2017-10-10 DIAGNOSIS — R945 Abnormal results of liver function studies: Secondary | ICD-10-CM

## 2017-10-10 DIAGNOSIS — R7989 Other specified abnormal findings of blood chemistry: Secondary | ICD-10-CM

## 2017-10-10 MED ORDER — METOPROLOL TARTRATE 25 MG PO TABS: 12.5000 mg | ORAL_TABLET | Freq: Two times a day (BID) | ORAL | 3 refills | Status: DC

## 2017-10-10 MED ORDER — CLOPIDOGREL BISULFATE 75 MG PO TABS
75.0000 mg | ORAL_TABLET | Freq: Every day | ORAL | 3 refills | Status: DC
Start: 2017-10-10 — End: 2018-01-17

## 2017-10-10 NOTE — Patient Instructions (Signed)
Medication Instructions:   Your physician has recommended you make the following change in your medication:   Start lopressor (metoprolol tartrate) 12.5 mg by mouth twice daily.  Continue all other medications the same.  Labwork:  Your physician recommends that you return for lab work in: next week to check your CBC & LFT's. (can be done with Dr. Olevia Perches lab work)  Testing/Procedures:  NONE  Follow-Up:  Your physician recommends that you schedule a follow-up appointment in: 3 months with Dr. Harl Bowie.  Any Other Special Instructions Will Be Listed Below (If Applicable).  If you need a refill on your cardiac medications before your next appointment, please call your pharmacy.

## 2017-10-10 NOTE — Progress Notes (Signed)
Cardiology Office Note    Date:  10/10/2017   ID:  Sharon Lawrence, DOB April 03, 1965, MRN [id number]  PCP:  Joyice Faster, FNP  Cardiologist: Carlyle Dolly, MD    Chief Complaint  Patient presents with  . Hospitalization Follow-up    History of Present Illness:    Sharon Lawrence is a 52 y.o. female with past medical history of CAD (s/p recent STEMI on 09/15/2017 with DES to RCA), HLD, celiac disease, chronic anemia, and prior tobacco use who presents to the office today for hospital follow-up.  She was initially admitted in 09/2017 for evaluation of new-onset chest discomfort and initial EKG showed ST elevation along the inferior leads. She underwent emergent cardiac catheterization which showed 80% proximal RCA stenosis which appeared to be a ruptured plaque. EF was preserved at 55 to 65% by visual estimate. She underwent placement of a DES and was started on DAPT with ASA and Brilinta.  She presented back to [facility] ED on 09/25/2017 for episodes of hematochezia and diarrhea over the past 2 days. Hemoglobin was 13.4 on admission but dropped to 11.3 the next day. Cardiology was consulted for further instructions regarding DAPT given her recent stent placement. She was switched from Brilinta to Plavix (with loading dose then 89m daily) for somewhat less potent platelet inhibition continued on ASA 81 mg daily. GI was consulted and she underwent a colonoscopy which showed 5 small polyps without bleeding and external hemorrhoids without active bleeding. Continued medical therapy was recommended for consideration of polyp removal in 1 year once able to hold DAPT.  In talking with the patient today, she reports overall doing well since her recent hospitalizations. She denies any repeat episodes of chest pain or dyspnea on exertion. No recent orthopnea, PND, or lower extremity edema.  She does report occasional episodes of bright red blood (less than once per week) which she equates to external  hemorrhoids but says this has significantly improved. No recurrent diarrhea or vomiting. Reports good compliance with her medication regimen including ASA and Plavix. Denies missing any recent doses. She is anxious to return to work as she works as a nMarine scientistat [employer]   Past Medical History:  Diagnosis Date  . B12 deficiency 02/07/2011  . Back pain   . CAD (coronary artery disease)    a. s/p recent STEMI on 09/15/2017 with DES to RCA  . Celiac disease   . Folic acid deficiency 196/04/9526 . Headache   . Iron deficiency anemia 02/07/2011  . Myocardial infarction ([facility]     Past Surgical History:  Procedure Laterality Date  . CHOLECYSTECTOMY    . COLONOSCOPY WITH PROPOFOL N/A 09/26/2017   Procedure: COLONOSCOPY WITH PROPOFOL;  Surgeon: RRogene Houston MD;  [facility]O [facility];  Service: Endoscopy;  Laterality: N/A;  . CORONARY STENT INTERVENTION Right 09/15/2017   Procedure: CORONARY STENT INTERVENTION;  Surgeon: VJettie Booze MD;  [facility];  Service: Cardiovascular;  Laterality: Right;  RCA  . KNEE CARTILAGE SURGERY Right   . LEFT HEART CATH AND CORONARY ANGIOGRAPHY N/A 09/15/2017   Procedure: LEFT HEART CATH AND CORONARY ANGIOGRAPHY;  Surgeon: VJettie Booze MD;  [facility];  Service: Cardiovascular;  Laterality: N/A;  . TONSILLECTOMY      Current Medications: Outpatient Medications Prior to Visit  Medication Sig Dispense Refill  . aspirin 81 MG chewable tablet Chew 1 tablet (81 mg total) by mouth daily.    .Marland Kitchen  atorvastatin (LIPITOR) 40 MG tablet Take 1 tablet (40 mg total) by mouth daily at 6 PM. 30 tablet 0  . cholecalciferol (VITAMIN D) 1000 units tablet Take 5,000 Units by mouth daily.    . Cyanocobalamin (VITAMIN B-12 IJ) Inject as directed every 30 (thirty) days.     Marland Kitchen gabapentin (NEURONTIN) 300 MG capsule Take 900 mg by mouth at bedtime.    . Melatonin 10 MG TABS Take 50 mg by mouth at bedtime.    .  nitroGLYCERIN (NITROSTAT) 0.4 MG SL tablet Place 1 tablet (0.4 mg total) under the tongue every 5 (five) minutes as needed for chest pain. 25 tablet 2  . ondansetron (ZOFRAN-ODT) 8 MG disintegrating tablet Take 1 tablet by mouth 3 (three) times daily as needed for nausea.   3  . oxyCODONE-acetaminophen (PERCOCET/ROXICET) 5-325 MG tablet Take 1 tablet by mouth every 6 (six) hours as needed for severe pain.    . pantoprazole (PROTONIX) 40 MG tablet Take 1 tablet (40 mg total) by mouth daily before breakfast. 30 tablet 0  . promethazine (PHENERGAN) 25 MG tablet Take 1 tablet by mouth 3 (three) times daily as needed for nausea.   2  . clopidogrel (PLAVIX) 75 MG tablet Take 1 tablet (75 mg total) by mouth daily. 30 tablet 1  . sucralfate (CARAFATE) 1 g tablet Take 1 tablet by mouth 2 (two) times daily as needed (upset stomach).   2   No facility-administered medications prior to visit.      Allergies:   Bee venom; Erythromycin; Neosporin original [bacitracin-neomycin-polymyxin]; Sulfa antibiotics; Tobrex [tobramycin]; Tramadol; and Penicillins   Social History   Socioeconomic History  . Marital status: Single    Spouse name: Not on file  . Number of children: Not on file  . Years of education: Not on file  . Highest education level: Not on file  Occupational History  . Not on file  Social Needs  . Financial resource strain: Not on file  . Food insecurity:    Worry: Not on file    Inability: Not on file  . Transportation needs:    Medical: Not on file    Non-medical: Not on file  Tobacco Use  . Smoking status: Former Smoker    Packs/day: 0.25    Years: 12.00    Pack years: 3.00    Types: Cigarettes    Last attempt to quit: 04/04/2017    Years since quitting: 0.5  . Smokeless tobacco: Never Used  Substance and Sexual Activity  . Alcohol use: No  . Drug use: No  . Sexual activity: Never  Lifestyle  . Physical activity:    Days per week: Not on file    Minutes per session: Not on  file  . Stress: Not on file  Relationships  . Social connections:    Talks on phone: Not on file    Gets together: Not on file    Attends religious service: Not on file    Active member of club or organization: Not on file    Attends meetings of clubs or organizations: Not on file    Relationship status: Not on file  Other Topics Concern  . Not on file  Social History Narrative  . Not on file     Family History:  The patient's family history includes CAD in her maternal grandfather and paternal grandmother; Cancer in her mother and sister; Cirrhosis in her paternal grandfather; Diabetes in her paternal grandmother; Hypertension in her father and  mother; Hypothyroidism in her father and paternal grandmother.   Review of Systems:   Please see the history of present illness.     General:  No chills, fever, night sweats or weight changes.  Cardiovascular:  No chest pain, dyspnea on exertion, orthopnea, palpitations, paroxysmal nocturnal dyspnea. Positive for edema.  Dermatological: No rash, lesions/masses Respiratory: No cough, dyspnea Urologic: No hematuria, dysuria Abdominal:   No nausea, vomiting, diarrhea, melena, or hematemesis. Positive for intermittent hematochezia.  Neurologic:  No visual changes, wkns, changes in mental status. All other systems reviewed and are otherwise negative except as noted above.   Physical Exam:    VS:  BP 120/80   Pulse 94   Ht 5' (1.524 m)   Wt 208 lb (94.3 kg)   SpO2 98% Comment: on room air  BMI 40.62 kg/m    General: Well developed, well nourished Caucasian female appearing in no acute distress. Head: Normocephalic, atraumatic, sclera non-icteric, no xanthomas, nares are without discharge.  Neck: No carotid bruits. JVD not elevated.  Lungs: Respirations regular and unlabored, without wheezes or rales.  Heart: Regular rate and rhythm. No S3 or S4.  No murmur, no rubs, or gallops appreciated. Abdomen: Soft, non-tender, non-distended with  normoactive bowel sounds. No hepatomegaly. No rebound/guarding. No obvious abdominal masses. Msk:  Strength and tone appear normal for age. No joint deformities or effusions. Extremities: No clubbing or cyanosis. No edema.  Distal pedal pulses are 2+ bilaterally. Radial site stable without ecchymosis or evidence of a hematoma.  Neuro: Alert and oriented X 3. Moves all extremities spontaneously. No focal deficits noted. Psych:  Responds to questions appropriately with a normal affect. Skin: No rashes or lesions noted  Wt Readings from Last 3 Encounters:  10/10/17 208 lb (94.3 kg)  09/26/17 209 lb 10.5 oz (95.1 kg)  09/15/17 211 lb 13.8 oz (96.1 kg)     Studies/Labs Reviewed:   EKG:  EKG is not ordered today.  EKG from 09/25/2017 is reviewed which shows NSR, HR 82, with RAD and inferior TWI.   Recent Labs: 09/26/2017: ALT 91 09/27/2017: BUN 9; Creatinine, Ser 0.61; Hemoglobin 12.2; Magnesium 1.8; Platelets 486; Potassium 3.5; Sodium 140   Lipid Panel    Component Value Date/Time   CHOL 77 09/16/2017 0358   TRIG 64 09/16/2017 0358   HDL 19 (L) 09/16/2017 0358   CHOLHDL 4.1 09/16/2017 0358   VLDL 13 09/16/2017 0358   LDLCALC 45 09/16/2017 0358    Additional studies/ records that were reviewed today include:   Echocardiogram: 01/19/2016 Study Conclusions  - Left ventricle: The cavity size was normal. Wall thickness was   normal. Systolic function was vigorous. The estimated ejection   fraction was in the range of 65% to 70%. Wall motion was normal;   there were no regional wall motion abnormalities. Left   ventricular diastolic function parameters were normal. - Mitral valve: There was trivial regurgitation. - Right atrium: Central venous pressure (est): 3 mm Hg. - Atrial septum: No defect or patent foramen ovale was identified. - Tricuspid valve: There was physiologic regurgitation. - Pulmonary arteries: Systolic pressure could not be accurately   estimated. - Pericardium,  extracardiac: There was no pericardial effusion.  Impressions:  - Normal LV wall thickness with LVEF 65-70% and normal diastolic   function. Trivial mitral regurgitation.  Cardiac Catheterization: 09/15/2017  Prox RCA lesion is 80% stenosed. It appeared to be a ruptured plaque.  A drug-eluting stent was successfully placed using a STENT SYNERGY DES 4X24.  Post intervention, there is a 0% residual stenosis.  The left ventricular systolic function is normal.  LV end diastolic pressure is low.  The left ventricular ejection fraction is 55-65% by visual estimate.  Severe spasm in the right radial artery, requiring heavy sedation and IA NTG. 4Fr diagnostic and 5 Fr Guide catheter used.  Recommend uninterrupted dual antiplatelet therapy with Aspirin 2m daily and Ticagrelor 9106mtwice daily for a minimum of 12 months (ACS - Class I recommendation).   Start high dose statin. Check A1C.  Continue smoking cessation.     Assessment:    1. Coronary artery disease involving native coronary artery of native heart without angina pectoris   2. ST elevation myocardial infarction (STEMI), subsequent episode of care ([facility]  3. Hyperlipidemia LDL goal <70   4. Anemia, unspecified type   5. Elevated LFTs      Plan:   In order of problems listed above:  1. CAD/ Subsequent Episode of Care for STEMI - The patient was admitted last month for an inferior STEMI and cardiac catheterization showed 80% proximal RCA stenosis which appeared to be a ruptured plaque. Underwent placement of a DES and was started on DAPT with ASA and Brilinta.  Was recently hospitalized for hematochezia and diarrhea which led to her being switched from Brilinta to Plavix.  Was continued on ASA 81 mg daily. - She reports overall doing well from a cardiac perspective and denies any repeat episodes of chest discomfort. - Will continue on DAPT with ASA and Plavix along with statin therapy. Will restart Lopressor at 12.5  mg twice daily now that BP has normalized.  2. HLD - FLP in 09/2017 showed total cholesterol 77, triglycerides 64, HDL 19, and LDL 45. At goal of LDL less than 70. Remains on Atorvastatin 40 mg daily. Will recheck LFT's next week.  3. Anemia - Hemoglobin stable at 12.2 on 09/27/2017.  She reports having intermittent hematochezia due to her external hemorrhoids but says this has significantly improved. Will recheck CBC.   4. Elevated LFT's - AST at 123 and ALT 119 on 09/25/2017, trending down to 91 and 91 on 09/26/2017. Atorvastatin was reduced from 80 mg daily to 40 mg daily at that time.  Will plan for repeat LFT's next week.    Medication Adjustments/Labs and Tests Ordered: Current medicines are reviewed at length with the patient today.  Concerns regarding medicines are outlined above.  Medication changes, Labs and Tests ordered today are listed in the Patient Instructions below. Patient Instructions  Medication Instructions:   Your physician has recommended you make the following change in your medication:   Start lopressor (metoprolol tartrate) 12.5 mg by mouth twice daily.  Continue all other medications the same.  Labwork:  Your physician recommends that you return for lab work in: next week to check your CBC & LFT's. (can be done with Dr. ReOlevia Perchesab work)  Testing/Procedures:  NONE  Follow-Up:  Your physician recommends that you schedule a follow-up appointment in: 3 months with Dr. BrHarl Bowie Any Other Special Instructions Will Be Listed Below (If Applicable).  If you need a refill on your cardiac medications before your next appointment, please call your pharmacy.    Signed, BrErma HeritagePA-C  10/10/2017 4:04 PM    [facility] [address] [address]hone: [phone]

## 2017-10-15 ENCOUNTER — Encounter (INDEPENDENT_AMBULATORY_CARE_PROVIDER_SITE_OTHER): Payer: Self-pay | Admitting: Internal Medicine

## 2017-10-15 ENCOUNTER — Ambulatory Visit (INDEPENDENT_AMBULATORY_CARE_PROVIDER_SITE_OTHER): Payer: 59 | Admitting: Internal Medicine

## 2017-10-15 VITALS — BP 140/80 | HR 68 | Temp 97.7°F | Ht 60.0 in | Wt 210.2 lb

## 2017-10-15 DIAGNOSIS — R748 Abnormal levels of other serum enzymes: Secondary | ICD-10-CM

## 2017-10-15 DIAGNOSIS — K625 Hemorrhage of anus and rectum: Secondary | ICD-10-CM | POA: Diagnosis not present

## 2017-10-15 LAB — CBC
HCT: 36.5 % (ref 35.0–45.0)
Hemoglobin: 11.5 g/dL — ABNORMAL LOW (ref 11.7–15.5)
MCH: 25.1 pg — ABNORMAL LOW (ref 27.0–33.0)
MCHC: 31.5 g/dL — ABNORMAL LOW (ref 32.0–36.0)
MCV: 79.7 fL — ABNORMAL LOW (ref 80.0–100.0)
MPV: 10.7 fL (ref 7.5–12.5)
Platelets: 597 10*3/uL — ABNORMAL HIGH (ref 140–400)
RBC: 4.58 10*6/uL (ref 3.80–5.10)
RDW: 17.6 % — ABNORMAL HIGH (ref 11.0–15.0)
WBC: 7.7 10*3/uL (ref 3.8–10.8)

## 2017-10-15 LAB — HEPATIC FUNCTION PANEL
AG Ratio: 1.2 (calc) (ref 1.0–2.5)
ALT: 95 U/L — ABNORMAL HIGH (ref 6–29)
AST: 87 U/L — ABNORMAL HIGH (ref 10–35)
Albumin: 3.1 g/dL — ABNORMAL LOW (ref 3.6–5.1)
Alkaline phosphatase (APISO): 222 U/L — ABNORMAL HIGH (ref 33–130)
Bilirubin, Direct: 0.1 mg/dL (ref 0.0–0.2)
Globulin: 2.6 g/dL (calc) (ref 1.9–3.7)
Indirect Bilirubin: 0.2 mg/dL (calc) (ref 0.2–1.2)
Total Bilirubin: 0.3 mg/dL (ref 0.2–1.2)
Total Protein: 5.7 g/dL — ABNORMAL LOW (ref 6.1–8.1)

## 2017-10-15 NOTE — Progress Notes (Signed)
Subjective:    Patient ID: Sharon Lawrence, female    DOB: 23-Jul-1965, 52 y.o.   MRN: [id number]  HPI Here today for f/u after recent admission to [facility] in July for GI bleed.  Underwent a colonoscopy by Dr. Laural Golden which revealed 5 sessile non-bleeding polyps rectum, sigmoid and hepatic flexure. External hemorrhoids./ Bleeding secondary to external hemorrhoids. Next colonoscopy in 1 year.  While in hospital she maintained her H and H.  Hx of Celiac diseas, B12 deficiency, hx of colon polyps. Recent  STEMI  In July and treated with DES to [facility]. Maintained on Plavix and Baby ASA Family hx of rectal cancer in a sister. States has been on clear liquids for the past 3 weeks.  This past week she has added some solid foods. She says the diarrhea has stopped. Has not seen any frank bleeding.  Her appetite is not good but it is better. She is taking Phenergan 38m daily. Having a BM x 2 a day.  She is sticking to a Celiac diet a much as she can.   Review of Systems  Past Medical History:  Diagnosis Date  . B12 deficiency 02/07/2011  . Back pain   . CAD (coronary artery disease)    a. s/p recent STEMI on 09/15/2017 with DES to RCA  . Celiac disease   . Folic acid deficiency 136/08/2945 . Headache   . Iron deficiency anemia 02/07/2011  . Myocardial infarction ([facility]     Past Surgical History:  Procedure Laterality Date  . CHOLECYSTECTOMY    . COLONOSCOPY WITH PROPOFOL N/A 09/26/2017   Procedure: COLONOSCOPY WITH PROPOFOL;  Surgeon: RRogene Houston MD;  [facility];  Service: Endoscopy;  Laterality: N/A;  . CORONARY STENT INTERVENTION Right 09/15/2017   Procedure: CORONARY STENT INTERVENTION;  Surgeon: VJettie Booze MD;  [facility];  Service: Cardiovascular;  Laterality: Right;  RCA  . KNEE CARTILAGE SURGERY Right   . LEFT HEART CATH AND CORONARY ANGIOGRAPHY N/A 09/15/2017   Procedure: LEFT HEART CATH AND CORONARY ANGIOGRAPHY;  Surgeon: VJettie Booze MD;   [facility]: [facility];  Service: Cardiovascular;  Laterality: N/A;  . TONSILLECTOMY      Allergies  Allergen Reactions  . Bee Venom Anaphylaxis  . Erythromycin Anaphylaxis  . Neosporin Original [Bacitracin-Neomycin-Polymyxin] Swelling    Swelling at site of application  . Sulfa Antibiotics Swelling    Sulfa eye drops caused eyes to swell  . Tobrex [Tobramycin] Swelling  . Tramadol Hives  . Penicillins Rash    Has patient had a PCN reaction causing immediate rash, facial/tongue/throat swelling, SOB or lightheadedness with hypotension: Yes Has patient had a PCN reaction causing severe rash involving mucus membranes or skin necrosis: No Has patient had a PCN reaction that required hospitalization: No Has patient had a PCN reaction occurring within the last 10 years: No If all of the above answers are "NO", then may proceed with Cephalosporin use.     Current Outpatient Medications on File Prior to Visit  Medication Sig Dispense Refill  . aspirin 81 MG chewable tablet Chew 1 tablet (81 mg total) by mouth daily.    .Marland Kitchenatorvastatin (LIPITOR) 40 MG tablet Take 1 tablet (40 mg total) by mouth daily at 6 PM. 30 tablet 0  . cholecalciferol (VITAMIN D) 1000 units tablet Take 5,000 Units by mouth daily.    . clopidogrel (PLAVIX) 75 MG tablet Take 1 tablet (75 mg total) by mouth daily.  90 tablet 3  . Cyanocobalamin (VITAMIN B-12 IJ) Inject as directed every 30 (thirty) days.     Marland Kitchen gabapentin (NEURONTIN) 300 MG capsule Take 900 mg by mouth at bedtime.    . hydrochlorothiazide (HYDRODIURIL) 50 MG tablet Take 50 mg by mouth as needed.    . Melatonin 10 MG TABS Take 50 mg by mouth at bedtime.    . metoprolol tartrate (LOPRESSOR) 25 MG tablet Take 0.5 tablets (12.5 mg total) by mouth 2 (two) times daily. 90 tablet 3  . nitroGLYCERIN (NITROSTAT) 0.4 MG SL tablet Place 1 tablet (0.4 mg total) under the tongue every 5 (five) minutes as needed for chest pain. 25 tablet 2  . ondansetron  (ZOFRAN-ODT) 8 MG disintegrating tablet Take 1 tablet by mouth 3 (three) times daily as needed for nausea.   3  . oxyCODONE-acetaminophen (PERCOCET/ROXICET) 5-325 MG tablet Take 1 tablet by mouth every 6 (six) hours as needed for severe pain.    . pantoprazole (PROTONIX) 40 MG tablet Take 1 tablet (40 mg total) by mouth daily before breakfast. 30 tablet 0  . promethazine (PHENERGAN) 25 MG tablet Take 1 tablet by mouth 3 (three) times daily as needed for nausea.   2   No current facility-administered medications on file prior to visit.          Objective:   Physical Exam Blood pressure 140/80, pulse 68, temperature 97.7 F (36.5 C), height 5' (1.524 m), weight 210 lb 3.2 oz (95.3 kg).  Alert and oriented. Skin warm and dry. Oral mucosa is moist.   . Sclera anicteric, conjunctivae is pink. Thyroid not enlarged. No cervical lymphadenopathy. Lungs clear. Heart regular rate and rhythm.  Abdomen is soft. Bowel sounds are positive. No hepatomegaly. No abdominal masses felt. No tenderness.  No edema to lower extremities.         Assessment & Plan:  Rectal bleeding.  Resolved.  She will try to continue on a Celiac diet. CBC Elevated liver enzymes: while in hospital for MI and rectal bleeding.  hepatic function.  OV in 1 year.

## 2017-10-15 NOTE — Patient Instructions (Signed)
OV in 1 year.  

## 2017-10-16 ENCOUNTER — Telehealth: Payer: Self-pay | Admitting: *Deleted

## 2017-10-16 NOTE — Telephone Encounter (Signed)
Called patient with test results. No answer. Left message to call back.  

## 2017-10-16 NOTE — Telephone Encounter (Signed)
-----   Message from Erma Heritage, [place] sent at 10/16/2017  7:25 AM EDT ----- Please let the patient know her liver function has improved as compared to prior lab values. AST previously 123 and ALT 119, now down to 87 and 95 respectively. Will need to continue to follow these (can be rechecked in 4 weeks by [place], PCP, or GI). Would recommend further reducing Atorvastatin to 20mg  daily. Hgb overall stable at 11.5. Chronic thrombocytosis (high platelet count) again present. Please forward a copy to Joyice Faster, FNP. Thank you.

## 2017-10-17 ENCOUNTER — Telehealth: Payer: Self-pay

## 2017-10-27 ENCOUNTER — Other Ambulatory Visit: Payer: Self-pay | Admitting: Student

## 2017-10-29 ENCOUNTER — Encounter (HOSPITAL_COMMUNITY): Payer: Self-pay

## 2017-10-29 ENCOUNTER — Ambulatory Visit (HOSPITAL_COMMUNITY): Admit: 2017-10-29 | Payer: 59 | Admitting: Internal Medicine

## 2017-10-29 SURGERY — COLONOSCOPY
Anesthesia: Moderate Sedation

## 2017-11-06 DIAGNOSIS — G8929 Other chronic pain: Secondary | ICD-10-CM | POA: Insufficient documentation

## 2017-11-06 DIAGNOSIS — Z2821 Immunization not carried out because of patient refusal: Secondary | ICD-10-CM | POA: Insufficient documentation

## 2017-11-06 DIAGNOSIS — J4540 Moderate persistent asthma, uncomplicated: Secondary | ICD-10-CM | POA: Insufficient documentation

## 2017-11-06 DIAGNOSIS — E038 Other specified hypothyroidism: Secondary | ICD-10-CM | POA: Insufficient documentation

## 2017-11-06 DIAGNOSIS — I252 Old myocardial infarction: Secondary | ICD-10-CM

## 2017-11-06 DIAGNOSIS — F5105 Insomnia due to other mental disorder: Secondary | ICD-10-CM | POA: Insufficient documentation

## 2017-11-06 DIAGNOSIS — F419 Anxiety disorder, unspecified: Secondary | ICD-10-CM

## 2017-11-06 DIAGNOSIS — G4700 Insomnia, unspecified: Secondary | ICD-10-CM

## 2017-11-06 DIAGNOSIS — K922 Gastrointestinal hemorrhage, unspecified: Secondary | ICD-10-CM

## 2017-11-06 DIAGNOSIS — I10 Essential (primary) hypertension: Secondary | ICD-10-CM | POA: Insufficient documentation

## 2017-11-06 DIAGNOSIS — R55 Syncope and collapse: Secondary | ICD-10-CM

## 2017-11-06 DIAGNOSIS — D649 Anemia, unspecified: Secondary | ICD-10-CM | POA: Insufficient documentation

## 2017-11-06 DIAGNOSIS — K635 Polyp of colon: Secondary | ICD-10-CM | POA: Insufficient documentation

## 2017-11-06 DIAGNOSIS — R600 Localized edema: Secondary | ICD-10-CM | POA: Insufficient documentation

## 2017-11-06 DIAGNOSIS — F988 Other specified behavioral and emotional disorders with onset usually occurring in childhood and adolescence: Secondary | ICD-10-CM | POA: Insufficient documentation

## 2017-11-06 DIAGNOSIS — E039 Hypothyroidism, unspecified: Secondary | ICD-10-CM | POA: Insufficient documentation

## 2017-11-06 DIAGNOSIS — K625 Hemorrhage of anus and rectum: Secondary | ICD-10-CM | POA: Insufficient documentation

## 2017-11-06 DIAGNOSIS — M62830 Muscle spasm of back: Secondary | ICD-10-CM | POA: Insufficient documentation

## 2017-11-06 DIAGNOSIS — F410 Panic disorder [episodic paroxysmal anxiety] without agoraphobia: Secondary | ICD-10-CM | POA: Insufficient documentation

## 2017-11-06 DIAGNOSIS — K219 Gastro-esophageal reflux disease without esophagitis: Secondary | ICD-10-CM | POA: Insufficient documentation

## 2017-11-06 DIAGNOSIS — M5441 Lumbago with sciatica, right side: Secondary | ICD-10-CM | POA: Insufficient documentation

## 2017-11-06 DIAGNOSIS — F5104 Psychophysiologic insomnia: Secondary | ICD-10-CM | POA: Insufficient documentation

## 2017-11-06 DIAGNOSIS — F902 Attention-deficit hyperactivity disorder, combined type: Secondary | ICD-10-CM | POA: Insufficient documentation

## 2017-11-06 DIAGNOSIS — D509 Iron deficiency anemia, unspecified: Secondary | ICD-10-CM | POA: Insufficient documentation

## 2017-11-06 DIAGNOSIS — F329 Major depressive disorder, single episode, unspecified: Secondary | ICD-10-CM | POA: Insufficient documentation

## 2017-11-06 DIAGNOSIS — G43909 Migraine, unspecified, not intractable, without status migrainosus: Secondary | ICD-10-CM | POA: Insufficient documentation

## 2017-11-06 HISTORY — DX: Gastrointestinal hemorrhage, unspecified: K92.2

## 2017-11-06 HISTORY — DX: Syncope and collapse: R55

## 2017-11-06 HISTORY — DX: Old myocardial infarction: I25.2

## 2017-11-17 ENCOUNTER — Encounter: Payer: Self-pay | Admitting: Psychiatry

## 2017-11-17 ENCOUNTER — Other Ambulatory Visit: Payer: Self-pay

## 2017-11-17 ENCOUNTER — Ambulatory Visit: Payer: 59 | Admitting: Psychiatry

## 2017-11-17 VITALS — BP 145/79 | HR 75 | Temp 99.4°F | Wt 210.2 lb

## 2017-11-17 DIAGNOSIS — F330 Major depressive disorder, recurrent, mild: Secondary | ICD-10-CM | POA: Diagnosis not present

## 2017-11-17 DIAGNOSIS — F5105 Insomnia due to other mental disorder: Secondary | ICD-10-CM

## 2017-11-17 DIAGNOSIS — F411 Generalized anxiety disorder: Secondary | ICD-10-CM | POA: Diagnosis not present

## 2017-11-17 DIAGNOSIS — F410 Panic disorder [episodic paroxysmal anxiety] without agoraphobia: Secondary | ICD-10-CM

## 2017-11-17 MED ORDER — DULOXETINE HCL 30 MG PO CPEP: 30.0000 mg | ORAL_CAPSULE | Freq: Every day | ORAL | 1 refills | Status: DC

## 2017-11-17 MED ORDER — TRAZODONE HCL 50 MG PO TABS: 50.0000 mg | ORAL_TABLET | Freq: Every evening | ORAL | 1 refills | Status: DC | PRN

## 2017-11-17 MED ORDER — CLONAZEPAM 0.5 MG PO TABS: 0.5000 mg | ORAL_TABLET | Freq: Two times a day (BID) | ORAL | 1 refills | Status: DC | PRN

## 2017-11-17 NOTE — Patient Instructions (Signed)
Clonazepam tablets What is this medicine? CLONAZEPAM (kloe NA ze pam) is a benzodiazepine. It is used to treat certain types of seizures. It is also used to treat panic disorder. This medicine may be used for other purposes; ask your health care provider or pharmacist if you have questions. COMMON BRAND NAME(S): Ceberclon, Klonopin What should I tell my health care provider before I take this medicine? They need to know if you have any of these conditions: -an alcohol or drug abuse problem -bipolar disorder, depression, psychosis or other mental health condition -glaucoma -kidney or liver disease -lung or breathing disease -myasthenia gravis -Parkinson's disease -porphyria -seizures or a history of seizures -suicidal thoughts -an unusual or allergic reaction to clonazepam, other benzodiazepines, foods, dyes, or preservatives -pregnant or trying to get pregnant -breast-feeding How should I use this medicine? Take this medicine by mouth with a glass of water. Follow the directions on the prescription label. If it upsets your stomach, take it with food or milk. Take your medicine at regular intervals. Do not take it more often than directed. Do not stop taking or change the dose except on the advice of your doctor or health care professional. A special MedGuide will be given to you by the pharmacist with each prescription and refill. Be sure to read this information carefully each time. Talk to your pediatrician regarding the use of this medicine in children. Special care may be needed. Overdosage: If you think you have taken too much of this medicine contact a poison control center or emergency room at once. NOTE: This medicine is only for you. Do not share this medicine with others. What if I miss a dose? If you miss a dose, take it as soon as you can. If it is almost time for your next dose, take only that dose. Do not take double or extra doses. What may interact with this medicine? Do  not take this medication with any of the following medicines: -narcotic medicines for cough -sodium oxybate This medicine may also interact with the following medications: -alcohol -antihistamines for allergy, cough and cold -antiviral medicines for HIV or AIDS -certain medicines for anxiety or sleep -certain medicines for depression, like amitriptyline, fluoxetine, sertraline -certain medicines for fungal infections like ketoconazole and itraconazole -certain medicines for seizures like carbamazepine, phenobarbital, phenytoin, primidone -general anesthetics like halothane, isoflurane, methoxyflurane, propofol -local anesthetics like lidocaine, pramoxine, tetracaine -medicines that relax muscles for surgery -narcotic medicines for pain -phenothiazines like chlorpromazine, mesoridazine, prochlorperazine, thioridazine This list may not describe all possible interactions. Give your health care provider a list of all the medicines, herbs, non-prescription drugs, or dietary supplements you use. Also tell them if you smoke, drink alcohol, or use illegal drugs. Some items may interact with your medicine. What should I watch for while using this medicine? Tell your doctor or health care professional if your symptoms do not start to get better or if they get worse. Do not stop taking except on your doctor's advice. You may develop a severe reaction. Your doctor will tell you how much medicine to take. You may get drowsy or dizzy. Do not drive, use machinery, or do anything that needs mental alertness until you know how this medicine affects you. To reduce the risk of dizzy and fainting spells, do not stand or sit up quickly, especially if you are an older patient. Alcohol may increase dizziness and drowsiness. Avoid alcoholic drinks. If you are taking another medicine that also causes drowsiness, you may have more side  effects. Give your health care provider a list of all medicines you use. Your doctor  will tell you how much medicine to take. Do not take more medicine than directed. Call emergency for help if you have problems breathing or unusual sleepiness. The use of this medicine may increase the chance of suicidal thoughts or actions. Pay special attention to how you are responding while on this medicine. Any worsening of mood, or thoughts of suicide or dying should be reported to your health care professional right away. What side effects may I notice from receiving this medicine? Side effects that you should report to your doctor or health care professional as soon as possible: -allergic reactions like skin rash, itching or hives, swelling of the face, lips, or tongue -breathing problems -confusion -loss of balance or coordination -signs and symptoms of low blood pressure like dizziness; feeling faint or lightheaded, falls; unusually weak or tired -suicidal thoughts or mood changes Side effects that usually do not require medical attention (report to your doctor or health care professional if they continue or are bothersome): -dizziness -headache -tiredness -upset stomach This list may not describe all possible side effects. Call your doctor for medical advice about side effects. You may report side effects to FDA at 1-800-FDA-1088. Where should I keep my medicine? Keep out of the reach of children. This medicine can be abused. Keep your medicine in a safe place to protect it from theft. Do not share this medicine with anyone. Selling or giving away this medicine is dangerous and against the law. This medicine may cause accidental overdose and death if taken by other adults, children, or pets. Mix any unused medicine with a substance like cat litter or coffee grounds. Then throw the medicine away in a sealed container like a sealed bag or a coffee can with a lid. Do not use the medicine after the expiration date. Store at room temperature between 15 and 30 degrees C (59 and 86 degrees F).  Protect from light. Keep container tightly closed. NOTE: This sheet is a summary. It may not cover all possible information. If you have questions about this medicine, talk to your doctor, pharmacist, or health care provider.  2018 [employer] (2015-07-28 18:46:32) Trazodone tablets What is this medicine? TRAZODONE (TRAZ oh done) is used to treat depression. This medicine may be used for other purposes; ask your health care provider or pharmacist if you have questions. COMMON BRAND NAME(S): Desyrel What should I tell my health care provider before I take this medicine? They need to know if you have any of these conditions: -attempted suicide or thinking about it -bipolar disorder -bleeding problems -glaucoma -heart disease, or previous heart attack -irregular heart beat -kidney or liver disease -low levels of sodium in the blood -an unusual or allergic reaction to trazodone, other medicines, foods, dyes or preservatives -pregnant or trying to get pregnant -breast-feeding How should I use this medicine? Take this medicine by mouth with a glass of water. Follow the directions on the prescription label. Take this medicine shortly after a meal or a light snack. Take your medicine at regular intervals. Do not take your medicine more often than directed. Do not stop taking this medicine suddenly except upon the advice of your doctor. Stopping this medicine too quickly may cause serious side effects or your condition may worsen. A special MedGuide will be given to you by the pharmacist with each prescription and refill. Be sure to read this information carefully each time. Talk to  your pediatrician regarding the use of this medicine in children. Special care may be needed. Overdosage: If you think you have taken too much of this medicine contact a poison control center or emergency room at once. NOTE: This medicine is only for you. Do not share this medicine with others. What if I  miss a dose? If you miss a dose, take it as soon as you can. If it is almost time for your next dose, take only that dose. Do not take double or extra doses. What may interact with this medicine? Do not take this medicine with any of the following medications: -certain medicines for fungal infections like fluconazole, itraconazole, ketoconazole, posaconazole, voriconazole -cisapride -dofetilide -dronedarone -linezolid -MAOIs like Carbex, Eldepryl, Marplan, Nardil, and Parnate -mesoridazine -methylene blue (injected into a vein) -pimozide -saquinavir -thioridazine -ziprasidone This medicine may also interact with the following medications: -alcohol -antiviral medicines for HIV or AIDS -aspirin and aspirin-like medicines -barbiturates like phenobarbital -certain medicines for blood pressure, heart disease, irregular heart beat -certain medicines for depression, anxiety, or psychotic disturbances -certain medicines for migraine headache like almotriptan, eletriptan, frovatriptan, naratriptan, rizatriptan, sumatriptan, zolmitriptan -certain medicines for seizures like carbamazepine and phenytoin -certain medicines for sleep -certain medicines that treat or prevent blood clots like dalteparin, enoxaparin, warfarin -digoxin -fentanyl -lithium -NSAIDS, medicines for pain and inflammation, like ibuprofen or naproxen -other medicines that prolong the QT interval (cause an abnormal heart rhythm) -rasagiline -supplements like St. John's wort, kava kava, valerian -tramadol -tryptophan This list may not describe all possible interactions. Give your health care provider a list of all the medicines, herbs, non-prescription drugs, or dietary supplements you use. Also tell them if you smoke, drink alcohol, or use illegal drugs. Some items may interact with your medicine. What should I watch for while using this medicine? Tell your doctor if your symptoms do not get better or if they get worse.  Visit your doctor or health care professional for regular checks on your progress. Because it may take several weeks to see the full effects of this medicine, it is important to continue your treatment as prescribed by your doctor. Patients and their families should watch out for new or worsening thoughts of suicide or depression. Also watch out for sudden changes in feelings such as feeling anxious, agitated, panicky, irritable, hostile, aggressive, impulsive, severely restless, overly excited and hyperactive, or not being able to sleep. If this happens, especially at the beginning of treatment or after a change in dose, call your health care professional. Dennis Bast may get drowsy or dizzy. Do not drive, use machinery, or do anything that needs mental alertness until you know how this medicine affects you. Do not stand or sit up quickly, especially if you are an older patient. This reduces the risk of dizzy or fainting spells. Alcohol may interfere with the effect of this medicine. Avoid alcoholic drinks. This medicine may cause dry eyes and blurred vision. If you wear contact lenses you may feel some discomfort. Lubricating drops may help. See your eye doctor if the problem does not go away or is severe. Your mouth may get dry. Chewing sugarless gum, sucking hard candy and drinking plenty of water may help. Contact your doctor if the problem does not go away or is severe. What side effects may I notice from receiving this medicine? Side effects that you should report to your doctor or health care professional as soon as possible: -allergic reactions like skin rash, itching or hives, swelling of the face, lips,  or tongue -elevated mood, decreased need for sleep, racing thoughts, impulsive behavior -confusion -fast, irregular heartbeat -feeling faint or lightheaded, falls -feeling agitated, angry, or irritable -loss of balance or coordination -painful or prolonged erections -restlessness, pacing, inability  to keep still -suicidal thoughts or other mood changes -tremors -trouble sleeping -seizures -unusual bleeding or bruising Side effects that usually do not require medical attention (report to your doctor or health care professional if they continue or are bothersome): -change in sex drive or performance -change in appetite or weight -constipation -headache -muscle aches or pains -nausea This list may not describe all possible side effects. Call your doctor for medical advice about side effects. You may report side effects to FDA at 1-800-FDA-1088. Where should I keep my medicine? Keep out of the reach of children. Store at room temperature between 15 and 30 degrees C (59 to 86 degrees F). Protect from light. Keep container tightly closed. Throw away any unused medicine after the expiration date. NOTE: This sheet is a summary. It may not cover all possible information. If you have questions about this medicine, talk to your doctor, pharmacist, or health care provider.  2018 [employer] Standard (2015-07-20 16:57:05) Duloxetine delayed-release capsules What is this medicine? DULOXETINE (doo LOX e teen) is used to treat depression, anxiety, and different types of chronic pain. This medicine may be used for other purposes; ask your health care provider or pharmacist if you have questions. COMMON BRAND NAME(S): Cymbalta, Irenka What should I tell my health care provider before I take this medicine? They need to know if you have any of these conditions: -bipolar disorder or a family history of bipolar disorder -glaucoma -kidney disease -liver disease -suicidal thoughts or a previous suicide attempt -taken medicines called MAOIs like Carbex, Eldepryl, Marplan, Nardil, and Parnate within 14 days -an unusual reaction to duloxetine, other medicines, foods, dyes, or preservatives -pregnant or trying to get pregnant -breast-feeding How should I use this medicine? Take this medicine by  mouth with a glass of water. Follow the directions on the prescription label. Do not cut, crush or chew this medicine. You can take this medicine with or without food. Take your medicine at regular intervals. Do not take your medicine more often than directed. Do not stop taking this medicine suddenly except upon the advice of your doctor. Stopping this medicine too quickly may cause serious side effects or your condition may worsen. A special MedGuide will be given to you by the pharmacist with each prescription and refill. Be sure to read this information carefully each time. Talk to your pediatrician regarding the use of this medicine in children. While this drug may be prescribed for children as young as 36 years of age for selected conditions, precautions do apply. Overdosage: If you think you have taken too much of this medicine contact a poison control center or emergency room at once. NOTE: This medicine is only for you. Do not share this medicine with others. What if I miss a dose? If you miss a dose, take it as soon as you can. If it is almost time for your next dose, take only that dose. Do not take double or extra doses. What may interact with this medicine? Do not take this medicine with any of the following medications: -desvenlafaxine -levomilnacipran -linezolid -MAOIs like Carbex, Eldepryl, Marplan, Nardil, and Parnate -methylene blue (injected into a vein) -milnacipran -thioridazine -venlafaxine This medicine may also interact with the following medications: -alcohol -amphetamines -aspirin and aspirin-like medicines -certain antibiotics like  ciprofloxacin and enoxacin -certain medicines for blood pressure, heart disease, irregular heart beat -certain medicines for depression, anxiety, or psychotic disturbances -certain medicines for migraine headache like almotriptan, eletriptan, frovatriptan, naratriptan, rizatriptan, sumatriptan, zolmitriptan -certain medicines that treat  or prevent blood clots like warfarin, enoxaparin, and dalteparin -cimetidine -fentanyl -lithium -NSAIDS, medicines for pain and inflammation, like ibuprofen or naproxen -phentermine -procarbazine -rasagiline -sibutramine -St. John's wort -theophylline -tramadol -tryptophan This list may not describe all possible interactions. Give your health care provider a list of all the medicines, herbs, non-prescription drugs, or dietary supplements you use. Also tell them if you smoke, drink alcohol, or use illegal drugs. Some items may interact with your medicine. What should I watch for while using this medicine? Tell your doctor if your symptoms do not get better or if they get worse. Visit your doctor or health care professional for regular checks on your progress. Because it may take several weeks to see the full effects of this medicine, it is important to continue your treatment as prescribed by your doctor. Patients and their families should watch out for new or worsening thoughts of suicide or depression. Also watch out for sudden changes in feelings such as feeling anxious, agitated, panicky, irritable, hostile, aggressive, impulsive, severely restless, overly excited and hyperactive, or not being able to sleep. If this happens, especially at the beginning of treatment or after a change in dose, call your health care professional. Dennis Bast may get drowsy or dizzy. Do not drive, use machinery, or do anything that needs mental alertness until you know how this medicine affects you. Do not stand or sit up quickly, especially if you are an older patient. This reduces the risk of dizzy or fainting spells. Alcohol may interfere with the effect of this medicine. Avoid alcoholic drinks. This medicine can cause an increase in blood pressure. This medicine can also cause a sudden drop in your blood pressure, which may make you feel faint and increase the chance of a fall. These effects are most common when you  first start the medicine or when the dose is increased, or during use of other medicines that can cause a sudden drop in blood pressure. Check with your doctor for instructions on monitoring your blood pressure while taking this medicine. Your mouth may get dry. Chewing sugarless gum or sucking hard candy, and drinking plenty of water may help. Contact your doctor if the problem does not go away or is severe. What side effects may I notice from receiving this medicine? Side effects that you should report to your doctor or health care professional as soon as possible: -allergic reactions like skin rash, itching or hives, swelling of the face, lips, or tongue -anxious -breathing problems -confusion -changes in vision -chest pain -confusion -elevated mood, decreased need for sleep, racing thoughts, impulsive behavior -eye pain -fast, irregular heartbeat -feeling faint or lightheaded, falls -feeling agitated, angry, or irritable -hallucination, loss of contact with reality -high blood pressure -loss of balance or coordination -palpitations -redness, blistering, peeling or loosening of the skin, including inside the mouth -restlessness, pacing, inability to keep still -seizures -stiff muscles -suicidal thoughts or other mood changes -trouble passing urine or change in the amount of urine -trouble sleeping -unusual bleeding or bruising -unusually weak or tired -vomiting -yellowing of the eyes or skin Side effects that usually do not require medical attention (report to your doctor or health care professional if they continue or are bothersome): -change in sex drive or performance -change in appetite or  weight -constipation -dizziness -dry mouth -headache -increased sweating -nausea -tired This list may not describe all possible side effects. Call your doctor for medical advice about side effects. You may report side effects to FDA at 1-800-FDA-1088. Where should I keep my  medicine? Keep out of the reach of children. Store at room temperature between 20 and 25 degrees C (68 to 77 degrees F). Throw away any unused medicine after the expiration date. NOTE: This sheet is a summary. It may not cover all possible information. If you have questions about this medicine, talk to your doctor, pharmacist, or health care provider.  2018 [employer] (2015-07-20 18:16:03)
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
## 2017-11-27 ENCOUNTER — Ambulatory Visit: Payer: 59 | Admitting: Licensed Clinical Social Worker

## 2017-12-01 ENCOUNTER — Other Ambulatory Visit: Payer: Self-pay

## 2017-12-01 ENCOUNTER — Ambulatory Visit: Payer: 59 | Admitting: Psychiatry

## 2017-12-01 ENCOUNTER — Encounter: Payer: Self-pay | Admitting: Psychiatry

## 2017-12-01 VITALS — BP 127/80 | HR 72 | Temp 98.2°F | Wt 212.0 lb

## 2017-12-01 DIAGNOSIS — F410 Panic disorder [episodic paroxysmal anxiety] without agoraphobia: Secondary | ICD-10-CM | POA: Diagnosis not present

## 2017-12-01 DIAGNOSIS — F411 Generalized anxiety disorder: Secondary | ICD-10-CM

## 2017-12-01 DIAGNOSIS — F5105 Insomnia due to other mental disorder: Secondary | ICD-10-CM

## 2017-12-01 DIAGNOSIS — F330 Major depressive disorder, recurrent, mild: Secondary | ICD-10-CM | POA: Diagnosis not present

## 2017-12-01 NOTE — Progress Notes (Signed)
[facility] Progress Note  12/01/2017 5:31 PM Sharon Lawrence  MRN:  [id number]  Chief Complaint: ' I am here for follow up." Chief Complaint    Follow-up; Medication Refill     HPI: Sharon Lawrence is a 52 year old female who is widowed, employed as a Marine scientist, lives in [place], has a history of coronary artery disease status post STEMI on 09/15/2017, status post stent placement, hyperlipidemia, chronic anemia, presented to the clinic today for a follow-up visit.  Patient today reports she only started the Cymbalta and did not yet start the trazodone as discussed.  She reports she wanted to try one medication at a time to see how she tolerates it.  She reports she has noticed nightmares at night ever since being on Cymbalta.  She however reports she continued to take it and wanted to discuss this with Probation officer before stopping it.  Patient reports her sleep hence is restless.  She continues to have panic attacks especially at work.  She reports she had at least 4 panic attacks last week at work.  She reports her coworkers are very supportive and that helps her a lot.  Patient reports she is able to manage her panic symptoms if it happens at night when she is at home but having it at workplace is kind of making it difficult for her.  She continues to take Klonopin however uses it only as needed.  Discussed with patient since we are making medication changes and she is not on a stable dose of Cymbalta yet to take a scheduled dose of Klonopin in the morning and a second dose as needed for severe anxiety attacks.  Patient will start psychotherapy sessions with our therapist soon.  She reports she missed her last appointment since she was late for her scheduled appointment due to traffic congestion.  Patient denies any suicidality.  Patient denies any perceptual disturbances.   Visit Diagnosis:    ICD-10-CM   1. Panic disorder F41.0   2. GAD (generalized anxiety disorder) F41.1   3. MDD (major depressive disorder),  recurrent episode, mild ([place]) F33.0   4. Insomnia due to mental disorder F51.05     Past Psychiatric History: Have reviewed past psychiatric history from my progress note on 11/17/2017.  Past trials of Zoloft, Paxil.  Past Medical History:  Past Medical History:  Diagnosis Date  . ADHD (attention deficit hyperactivity disorder)   . Anxiety   . B12 deficiency 02/07/2011  . Back pain   . CAD (coronary artery disease)    a. s/p recent STEMI on 09/15/2017 with DES to RCA  . Celiac disease   . Folic acid deficiency 44/0/1027  . Headache   . Iron deficiency anemia 02/07/2011  . Myocardial infarction [facility])     Past Surgical History:  Procedure Laterality Date  . CHOLECYSTECTOMY    . COLONOSCOPY WITH PROPOFOL N/A 09/26/2017   Procedure: COLONOSCOPY WITH PROPOFOL;  Surgeon: Rogene Houston, MD;  Location: [facility];  Service: Endoscopy;  Laterality: N/A;  . CORONARY STENT INTERVENTION Right 09/15/2017   Procedure: CORONARY STENT INTERVENTION;  Surgeon: Jettie Booze, MD;  Location: [facility];  Service: Cardiovascular;  Laterality: Right;  RCA  . KNEE CARTILAGE SURGERY Right   . LEFT HEART CATH AND CORONARY ANGIOGRAPHY N/A 09/15/2017   Procedure: LEFT HEART CATH AND CORONARY ANGIOGRAPHY;  Surgeon: Jettie Booze, MD;  Location: [facility] LAB;  Service: Cardiovascular;  Laterality: N/A;  . TONSILLECTOMY  Family Psychiatric History: Reviewed family psychiatric history from my progress note on 11/17/2017  Family History:  Family History  Problem Relation Age of Onset  . Cancer Mother   . Hypertension Mother   . Hypertension Father   . Hypothyroidism Father   . Cancer Sister   . Anxiety disorder Sister   . Depression Sister   . CAD Maternal Grandfather   . Cirrhosis Paternal Grandfather   . CAD Paternal Grandmother   . Diabetes Paternal Grandmother   . Hypothyroidism Paternal Grandmother   . Drug abuse Son   . Bipolar disorder Son     Social  History: Reviewed social history from my progress note on 11/17/2017 Social History   Socioeconomic History  . Marital status: Widowed    Spouse name: Not on file  . Number of children: 3  . Years of education: Not on file  . Highest education level: Associate degree: occupational, Hotel manager, or vocational program  Occupational History  . Not on file  Social Needs  . Financial resource strain: Not hard at all  . Food insecurity:    Worry: Never true    Inability: Never true  . Transportation needs:    Medical: No    Non-medical: No  Tobacco Use  . Smoking status: Former Smoker    Packs/day: 0.25    Years: 12.00    Pack years: 3.00    Types: Cigarettes    Last attempt to quit: 04/04/2017    Years since quitting: 0.6  . Smokeless tobacco: Never Used  Substance and Sexual Activity  . Alcohol use: No  . Drug use: No  . Sexual activity: Not Currently  Lifestyle  . Physical activity:    Days per week: 3 days    Minutes per session: 30 min  . Stress: Very much  Relationships  . Social connections:    Talks on phone: More than three times a week    Gets together: More than three times a week    Attends religious service: More than 4 times per year    Active member of club or organization: No    Attends meetings of clubs or organizations: Never    Relationship status: Widowed  Other Topics Concern  . Not on file  Social History Narrative  . Not on file    Allergies:  Allergies  Allergen Reactions  . Bee Venom Anaphylaxis  . Erythromycin Anaphylaxis  . Cephalexin   . Neosporin Original [Bacitracin-Neomycin-Polymyxin] Swelling    Swelling at site of application  . Prednisone   . Sulfa Antibiotics Swelling    Sulfa eye drops caused eyes to swell  . Tobrex [Tobramycin] Swelling  . Tramadol Hives  . Penicillins Rash    Has patient had a PCN reaction causing immediate rash, facial/tongue/throat swelling, SOB or lightheadedness with hypotension: Yes Has patient had a  PCN reaction causing severe rash involving mucus membranes or skin necrosis: No Has patient had a PCN reaction that required hospitalization: No Has patient had a PCN reaction occurring within the last 10 years: No If all of the above answers are "NO", then may proceed with Cephalosporin use.     Metabolic Disorder Labs: Lab Results  Component Value Date   HGBA1C 5.6 09/16/2017   MPG 114 09/16/2017   MPG 123 (H) 07/07/2013   No results found for: PROLACTIN Lab Results  Component Value Date   CHOL 77 09/16/2017   TRIG 64 09/16/2017   HDL 19 (L) 09/16/2017   CHOLHDL  4.1 09/16/2017   VLDL 13 09/16/2017   LDLCALC 45 09/16/2017   LDLCALC 97 07/07/2013   Lab Results  Component Value Date   TSH 2.471 07/07/2013   TSH 1.745 06/03/2012    Therapeutic Level Labs: No results found for: LITHIUM No results found for: VALPROATE No components found for:  CBMZ  Current Medications: Current Outpatient Medications  Medication Sig Dispense Refill  . aspirin 81 MG chewable tablet Chew 1 tablet (81 mg total) by mouth daily.    Marland Kitchen atorvastatin (LIPITOR) 40 MG tablet TAKE 1 TABLET BY MOUTH ONCE DAILY AT 6PM. 30 tablet 6  . baclofen (LIORESAL) 20 MG tablet Take by mouth.    . cholecalciferol (VITAMIN D) 1000 units tablet Take 5,000 Units by mouth daily.    . clonazePAM (KLONOPIN) 0.5 MG tablet Take 1 tablet (0.5 mg total) by mouth 2 (two) times daily as needed for anxiety. ONLY FOR SEVERE PANIC ATTACKS 60 tablet 1  . clopidogrel (PLAVIX) 75 MG tablet Take 1 tablet (75 mg total) by mouth daily. 90 tablet 3  . Cyanocobalamin (VITAMIN B-12 IJ) Inject as directed every 30 (thirty) days.     . cyclobenzaprine (FLEXERIL) 5 MG tablet Take by mouth.    . DULoxetine (CYMBALTA) 30 MG capsule Take 1 capsule (30 mg total) by mouth daily. 30 capsule 1  . fluticasone (FLONASE) 50 MCG/ACT nasal spray Place into the nose.    . gabapentin (NEURONTIN) 300 MG capsule Take 900 mg by mouth at bedtime.    .  hydrochlorothiazide (HYDRODIURIL) 50 MG tablet Take 50 mg by mouth as needed.    Marland Kitchen levonorgestrel (MIRENA) 20 MCG/24HR IUD by Intrauterine route.    . Melatonin 10 MG TABS Take 50 mg by mouth at bedtime.    . metoprolol tartrate (LOPRESSOR) 25 MG tablet Take 0.5 tablets (12.5 mg total) by mouth 2 (two) times daily. 90 tablet 3  . nitroGLYCERIN (NITROSTAT) 0.4 MG SL tablet Place 1 tablet (0.4 mg total) under the tongue every 5 (five) minutes as needed for chest pain. 25 tablet 2  . nystatin (MYCOSTATIN/NYSTOP) powder Apply topically.    . ondansetron (ZOFRAN-ODT) 8 MG disintegrating tablet Take 1 tablet by mouth 3 (three) times daily as needed for nausea.   3  . oxyCODONE-acetaminophen (PERCOCET/ROXICET) 5-325 MG tablet Take 1 tablet by mouth every 6 (six) hours as needed for severe pain.    . pantoprazole (PROTONIX) 40 MG tablet TAKE 1 TABLET BY MOUTH ONCE DAILY BEFORE BREAKFAST. 30 tablet 6  . promethazine (PHENERGAN) 25 MG tablet Take 25 mg by mouth 3 (three) times daily as needed for nausea.   2  . torsemide (DEMADEX) 20 MG tablet   2  . traZODone (DESYREL) 50 MG tablet Take 1-2 tablets (50-100 mg total) by mouth at bedtime as needed for sleep. 60 tablet 1  . Vitamin D, Ergocalciferol, (DRISDOL) 50000 units CAPS capsule Take by mouth.     No current facility-administered medications for this visit.      Musculoskeletal: Strength & Muscle Tone: within normal limits Gait & Station: normal Patient leans: N/A  Psychiatric Specialty Exam: Review of Systems  Psychiatric/Behavioral: Positive for depression. The patient is nervous/anxious and has insomnia.   All other systems reviewed and are negative.   Blood pressure 127/80, pulse 72, temperature 98.2 F (36.8 C), temperature source Oral, weight 212 lb (96.2 kg).Body mass index is 41.4 kg/m.  General Appearance: Casual  Eye Contact:  Fair  Speech:  Normal Rate  Volume:  Normal  Mood:  Anxious and Dysphoric  Affect:  Congruent   Thought Process:  Goal Directed and Descriptions of Associations: Intact  Orientation:  Full (Time, Place, and Person)  Thought Content: Logical   Suicidal Thoughts:  No  Homicidal Thoughts:  No  Memory:  Immediate;   Fair Recent;   Fair Remote;   Fair  Judgement:  Fair  Insight:  Fair  Psychomotor Activity:  Normal  Concentration:  Concentration: Fair and Attention Span: Fair  Recall:  [employer] of Knowledge: Fair  Language: Fair  Akathisia:  No  Handed:  Right  AIMS (if indicated): na  Assets:  Communication Skills Desire for Improvement Social Support  ADL's:  Intact  Cognition: WNL  Sleep:  restless due to nightmares   Screenings:   Assessment and Plan: Taeko is a 52 year old Caucasian female, widowed, lives in [place], employed, has a history of depression, anxiety, insomnia, hyperlipidemia, coronary artery disease, recent myocardial infarction, status post stent placement, presented to the clinic today for a follow-up visit.  Patient is biologically predisposed given her family history as well as her own health issues.  She also has several psychosocial stressors.  Patient with some side effects to her Cymbalta.  Discussed plan as noted below.  Plan Panic disorder Discussed with patient to change the Cymbalta dosing time to a.m. or afternoon.  She will continue 30 mg p.o. daily She will start taking Klonopin 0.5 mg p.o. during the day scheduled and 0.5 mg daily as needed for severe anxiety attacks Has been referred for CBT/panic control therapy  For GAD Cymbalta and Klonopin as prescribed Referral for psychotherapy.  For MDD Cymbalta as scheduled CBT.  For insomnia Discussed with patient to start taking trazodone 25-100 mg p.o. nightly as needed   Patient will reach out to writer in a week if she continues to have severe nightmares and is unable to sleep.  Reviewed her most recent labs-LFT continues to be elevated on 10/15/2017,TSH pending.  Follow-up in  clinic in 2-3 weeks or sooner if needed  More than 50 % of the time was spent for psychoeducation and supportive psychotherapy and care coordination.  This note was generated in part or whole with voice recognition software. Voice recognition is usually quite accurate but there are transcription errors that can and very often do occur. I apologize for any typographical errors that were not detected and corrected.       Ursula Alert, MD 12/01/2017, 5:31 PM

## 2017-12-01 NOTE — Patient Instructions (Signed)
Start taking Cymbalta early during the day . Start taking a scheduled dosage of Klonopin 0.5 mg in the AM and take the next dose of 0.5 mg as needed only for severe panic attacks. Start taking your trazodone at night for sleep. Please call me back in 1 week if you still have problems with cymbalta.

## 2017-12-03 ENCOUNTER — Ambulatory Visit
Admission: RE | Admit: 2017-12-03 | Discharge: 2017-12-03 | Disposition: A | Discharge status: Home / Self Care | Payer: 59 | Source: Ambulatory Visit | Attending: Nurse Practitioner | Admitting: Nurse Practitioner

## 2017-12-03 ENCOUNTER — Ambulatory Visit: Payer: 59 | Admitting: Nurse Practitioner

## 2017-12-03 ENCOUNTER — Other Ambulatory Visit
Admission: RE | Admit: 2017-12-03 | Discharge: 2017-12-03 | Disposition: A | Discharge status: Home / Self Care | Payer: 59 | Source: Ambulatory Visit | Attending: Nurse Practitioner | Admitting: Nurse Practitioner

## 2017-12-03 ENCOUNTER — Encounter: Payer: Self-pay | Admitting: Nurse Practitioner

## 2017-12-03 VITALS — BP 119/74 | HR 71 | Temp 99.0°F | Resp 16 | Ht 62.0 in | Wt 200.0 lb

## 2017-12-03 DIAGNOSIS — M533 Sacrococcygeal disorders, not elsewhere classified: Secondary | ICD-10-CM | POA: Diagnosis not present

## 2017-12-03 DIAGNOSIS — Z9049 Acquired absence of other specified parts of digestive tract: Secondary | ICD-10-CM | POA: Insufficient documentation

## 2017-12-03 DIAGNOSIS — F419 Anxiety disorder, unspecified: Secondary | ICD-10-CM | POA: Insufficient documentation

## 2017-12-03 DIAGNOSIS — Z882 Allergy status to sulfonamides status: Secondary | ICD-10-CM | POA: Diagnosis not present

## 2017-12-03 DIAGNOSIS — F329 Major depressive disorder, single episode, unspecified: Secondary | ICD-10-CM | POA: Diagnosis not present

## 2017-12-03 DIAGNOSIS — M79604 Pain in right leg: Secondary | ICD-10-CM | POA: Diagnosis not present

## 2017-12-03 DIAGNOSIS — Z881 Allergy status to other antibiotic agents status: Secondary | ICD-10-CM | POA: Insufficient documentation

## 2017-12-03 DIAGNOSIS — Z8349 Family history of other endocrine, nutritional and metabolic diseases: Secondary | ICD-10-CM | POA: Insufficient documentation

## 2017-12-03 DIAGNOSIS — Z7982 Long term (current) use of aspirin: Secondary | ICD-10-CM | POA: Diagnosis not present

## 2017-12-03 DIAGNOSIS — E785 Hyperlipidemia, unspecified: Secondary | ICD-10-CM | POA: Insufficient documentation

## 2017-12-03 DIAGNOSIS — Z87891 Personal history of nicotine dependence: Secondary | ICD-10-CM | POA: Diagnosis not present

## 2017-12-03 DIAGNOSIS — I2510 Atherosclerotic heart disease of native coronary artery without angina pectoris: Secondary | ICD-10-CM | POA: Insufficient documentation

## 2017-12-03 DIAGNOSIS — Z91030 Bee allergy status: Secondary | ICD-10-CM | POA: Diagnosis not present

## 2017-12-03 DIAGNOSIS — Z888 Allergy status to other drugs, medicaments and biological substances status: Secondary | ICD-10-CM | POA: Insufficient documentation

## 2017-12-03 DIAGNOSIS — K760 Fatty (change of) liver, not elsewhere classified: Secondary | ICD-10-CM | POA: Diagnosis not present

## 2017-12-03 DIAGNOSIS — K219 Gastro-esophageal reflux disease without esophagitis: Secondary | ICD-10-CM | POA: Diagnosis not present

## 2017-12-03 DIAGNOSIS — Z880 Allergy status to penicillin: Secondary | ICD-10-CM | POA: Diagnosis not present

## 2017-12-03 DIAGNOSIS — Z886 Allergy status to analgesic agent status: Secondary | ICD-10-CM | POA: Insufficient documentation

## 2017-12-03 DIAGNOSIS — G8929 Other chronic pain: Secondary | ICD-10-CM

## 2017-12-03 DIAGNOSIS — M5441 Lumbago with sciatica, right side: Secondary | ICD-10-CM | POA: Diagnosis not present

## 2017-12-03 DIAGNOSIS — E538 Deficiency of other specified B group vitamins: Secondary | ICD-10-CM | POA: Insufficient documentation

## 2017-12-03 DIAGNOSIS — F909 Attention-deficit hyperactivity disorder, unspecified type: Secondary | ICD-10-CM | POA: Insufficient documentation

## 2017-12-03 DIAGNOSIS — Z9889 Other specified postprocedural states: Secondary | ICD-10-CM | POA: Insufficient documentation

## 2017-12-03 DIAGNOSIS — M899 Disorder of bone, unspecified: Secondary | ICD-10-CM

## 2017-12-03 DIAGNOSIS — Z79891 Long term (current) use of opiate analgesic: Secondary | ICD-10-CM

## 2017-12-03 DIAGNOSIS — G894 Chronic pain syndrome: Secondary | ICD-10-CM

## 2017-12-03 DIAGNOSIS — Z6836 Body mass index (BMI) 36.0-36.9, adult: Secondary | ICD-10-CM | POA: Diagnosis not present

## 2017-12-03 DIAGNOSIS — Z818 Family history of other mental and behavioral disorders: Secondary | ICD-10-CM | POA: Insufficient documentation

## 2017-12-03 DIAGNOSIS — Z793 Long term (current) use of hormonal contraceptives: Secondary | ICD-10-CM | POA: Insufficient documentation

## 2017-12-03 DIAGNOSIS — I10 Essential (primary) hypertension: Secondary | ICD-10-CM | POA: Insufficient documentation

## 2017-12-03 DIAGNOSIS — I213 ST elevation (STEMI) myocardial infarction of unspecified site: Secondary | ICD-10-CM | POA: Insufficient documentation

## 2017-12-03 DIAGNOSIS — Z833 Family history of diabetes mellitus: Secondary | ICD-10-CM | POA: Insufficient documentation

## 2017-12-03 DIAGNOSIS — M47818 Spondylosis without myelopathy or radiculopathy, sacral and sacrococcygeal region: Secondary | ICD-10-CM | POA: Diagnosis not present

## 2017-12-03 DIAGNOSIS — Z79899 Other long term (current) drug therapy: Secondary | ICD-10-CM | POA: Diagnosis not present

## 2017-12-03 DIAGNOSIS — Z800 Family history of malignant neoplasm of digestive organs: Secondary | ICD-10-CM | POA: Insufficient documentation

## 2017-12-03 DIAGNOSIS — Z8379 Family history of other diseases of the digestive system: Secondary | ICD-10-CM | POA: Insufficient documentation

## 2017-12-03 DIAGNOSIS — Z789 Other specified health status: Secondary | ICD-10-CM | POA: Insufficient documentation

## 2017-12-03 DIAGNOSIS — Z813 Family history of other psychoactive substance abuse and dependence: Secondary | ICD-10-CM | POA: Insufficient documentation

## 2017-12-03 LAB — SEDIMENTATION RATE: Sed Rate: 10 mm/hr (ref 0–30)

## 2017-12-03 LAB — MAGNESIUM: Magnesium: 1.9 mg/dL (ref 1.7–2.4)

## 2017-12-03 LAB — C-REACTIVE PROTEIN: CRP: <0.8 mg/dL (ref <1.0)

## 2017-12-03 NOTE — Progress Notes (Signed)
Results were reviewed and found to HR:[id number]  No acute injury or pathology identified  Review would suggest interventional pain management techniques may be of benefit

## 2017-12-03 NOTE — Progress Notes (Signed)
Safety precautions to be maintained throughout the outpatient stay will include: orient to surroundings, keep bed in low position, maintain call bell within reach at all times, provide assistance with transfer out of bed and ambulation.  

## 2017-12-03 NOTE — Progress Notes (Signed)
Patient's Name: Sharon Lawrence  MRN: [id number]  Referring Provider: Vidal Schwalbe, MD  DOB: 10-Dec-1965  PCP: Vidal Schwalbe, MD  DOS: 12/03/2017  Note by: Dionisio David NP  Service setting: Ambulatory outpatient  Specialty: Interventional Pain Management  Location: [facility] (AMB) Pain Management Facility    Patient type: New Patient    Primary Reason(s) for Visit: Initial Patient Evaluation CC: Back Pain (lumbar bilateral, right is worse )  HPI  Sharon Lawrence is a 52 y.o. year old, female patient, who comes today for an initial evaluation. She has Folic acid deficiency; Iron deficiency; B12 deficiency; Fatty liver disease, nonalcoholic; Vaginal condyloma; Obesity; Family hx of colon cancer; Non-intractable vomiting with nausea; Celiac disease; Acute ST elevation myocardial infarction (STEMI) of inferior wall ([place]); Hyperlipidemia; Tobacco use; Hematochezia; STEMI (ST elevation myocardial infarction) ([place]), 09/15/17; Hypotension; Morbidly obese ([place]); LFTs abnormal; Chronic diarrhea, with Celiac disease; Former smoker; Hypothyroid; History of ST elevation myocardial infarction (STEMI); GI bleed; GERD without esophagitis; Bleeding per rectum; Bilateral lower extremity edema; Benign essential hypertension; Back muscle spasm; Anxiety disorder; Anemia; ADD (attention deficit disorder); Syncope; Sessile colonic polyp; Right-sided low back pain with sciatica; Refused influenza vaccine; RAD (reactive airway disease), moderate persistent, uncomplicated; Other chronic pain; Migraine; Major depressive disorder; Insomnia; IDA (iron deficiency anemia); Chronic bilateral low back pain with right-sided sciatica (Primary Area of Pain) (R>L); Chronic pain of right lower extremity (Secondary Area of Pain); Chronic sacroiliac joint pain (L>R); Chronic pain syndrome; Long term current use of opiate analgesic; Pharmacologic therapy; Disorder of skeletal system; and Problems influencing health status on their problem list.. Her primarily  concern today is the Back Pain (lumbar bilateral, right is worse )  Pain Assessment: Location: Lower, Left, Right Back Radiating: into right hip and down right leg to the back of knee  Onset: More than a month ago Duration: Chronic pain Quality: Discomfort, Spasm, Burning, Other (Comment)(when it goes down the leg it feels like a hot knife going down or stepping into an electrical fence ) Severity: 3 /10 (subjective, self-reported pain score)  Note: Reported level is compatible with observation. Clinically the patient looks like a 1/10 A 1/10 is viewed as "Mild" and described as nagging, annoying, but not interfering with basic activities of daily living (ADL). Sharon Lawrence is able to eat, bathe, get dressed, do toileting (being able to get on and off the toilet and perform personal hygiene functions), transfer (move in and out of bed or a chair without assistance), and maintain continence (able to control bladder and bowel functions). Physiologic parameters such as blood pressure and heart rate apear wnl. Information on the proper use of the pain scale provided to the patient today.  Effect on ADL: standing still or sitting will aggravate the pain.  if she keeps moving the pain is better  Timing: Intermittent Modifying factors: sitting down, heating pad, medications.  BP: 119/74  HR: 71  Onset and Duration: Gradual, Date of onset: 10 years ago and Present longer than 3 months Cause of pain: Unknown Severity: Getting worse, NAS-11 at its worse: 9/10, NAS-11 at its best: 0/10, NAS-11 now: 3/10 and NAS-11 on the average: 5/10 Timing: Afternoon and After activity or exercise Aggravating Factors: Bending, Climbing, Kneeling, Lifiting, Prolonged sitting, Prolonged standing, Squatting, Stooping , Twisting, Walking and Working Alleviating Factors: Stretching, Hot packs, Medications and Resting Associated Problems: Depression, Fatigue, Inability to concentrate, Nausea, Spasms, Sweating, Swelling,  Temperature changes, Vomiting  and Pain that does not allow patient to sleep Quality of Pain:  Aching, Annoying, Burning, Intermittent, Exhausting and Tiring Previous Examinations or Tests: Endoscopy, MRI scan, Neurological evaluation and Orthopedic evaluation Previous Treatments: Biofeedback, Narcotic medications, Physical Therapy, Steroid treatments by mouth and Stretching exercises  The patient comes into the clinics today for the first time for a chronic pain management evaluation.  According to the patient her primary area of pain is in her lower back.  She denies any precipitating factors.  She admits that the right side is greater than the left.  She denies any previous surgery.  She denies any interventional therapy.  She has had physical therapy on last year which was effective and she continues to do home exercise which she also gets benefit from.  She has had an MRI 2018.  Her second area of pain is in her legs.  She admits that is only in the right.  The pain goes down the back of her leg to the knee.  She admits that it feels like hot shocking type pain.  She denies normal weakness but admits that when her back is worse she has difficulty lifting her right leg.  She sometimes even has to get assistance to get out of her car when her pain is at its worse.  Today I took the time to provide the patient with information regarding this pain practice. The patient was informed that the practice is divided into two sections: an interventional pain management section, as well as a completely separate and distinct medication management section. I explained that there are procedure days for interventional therapies, and evaluation days for follow-ups and medication management. Because of the amount of documentation required during both, they are kept separated. This means that there is the possibility that she may be scheduled for a procedure on one day, and medication management the next. I have also  informed her that because of staffing and facility limitations, this practice will no longer take patients for medication management only. To illustrate the reasons for this, I gave the patient the example of surgeons, and how inappropriate it would be to refer a patient to his/her care, just to write for the post-surgical antibiotics on a surgery done by a different surgeon.   Because interventional pain management is part of the board-certified specialty for the doctors, the patient was informed that joining this practice means that they are open to any and all interventional therapies. I made it clear that this does not mean that they will be forced to have any procedures done. What this means is that I believe interventional therapies to be essential part of the diagnosis and proper management of chronic pain conditions. Therefore, patients not interested in these interventional alternatives will be better served under the care of a different practitioner.  The patient was also made aware of my Comprehensive Pain Management Safety Guidelines where by joining this practice, they limit all of their nerve blocks and joint injections to those done by our practice, for as long as we are retained to manage their care. Historic Controlled Substance Pharmacotherapy Review  PMP and historical list of controlled substances: Oxycodone/acetaminophen 5/325 mg, Hydromet syrup, hydrocodone/homo-atropine syrup, hydrocodone/Chlorphen ER suspension, Vyvanse 30 mg, Vyvanse 50 mg, Vyvanse 40 mg, Vyvanse 20 mg, phentermine 15 mg Highest opioid analgesic regimen found: Oxycodone/acetaminophen 5/325 1 tablet 4 times daily 6 days (last fill date 08/20/2017) oxycodone 25 mg daily Most recent opioid analgesic: Oxycodone/acetaminophen 5/325 mg 1 tablet 4 times daily 7 days (fill date 10/17/2017) oxycodone 20 mg/day Current opioid analgesics:  None Highest recorded MME/day: 31.67m/day MME/day: 0 mg/day Medications: The patient  did not bring the medication(s) to the appointment, as requested in our "New Patient Package" Pharmacodynamics: Desired effects: Analgesia: The patient reports >50% benefit. Reported improvement in function: The patient reports medication allows her to accomplish basic ADLs. Clinically meaningful improvement in function (CMIF): Sustained CMIF goals met Perceived effectiveness: Described as relatively effective, allowing for increase in activities of daily living (ADL) Undesirable effects: Side-effects or Adverse reactions: None reported Historical Monitoring: The patient  reports that she does not use drugs. List of all UDS Test(s): No results found for: MDMA, COCAINSCRNUR, PCPSCRNUR, PCPQUANT, CANNABQUANT, THCU, ELisbonList of all Serum Drug Screening Test(s):  No results found for: AMPHSCRSER, BARBSCRSER, BENZOSCRSER, COCAINSCRSER, PCPSCRSER, PCPQUANT, THCSCRSER, CANNABQUANT, OPIATESCRSER, OXYSCRSER, PROPOXSCRSER Historical Background Evaluation: [place] PDMP: Six (6) year initial data search conducted.             [place] Department of public safety, offender search: (Editor, commissioningInformation) Non-contributory Risk Assessment Profile: Aberrant behavior: None observed or detected today Risk factors for fatal opioid overdose: None identified today Fatal overdose hazard ratio (HR): Calculation deferred Non-fatal overdose hazard ratio (HR): Calculation deferred Risk of opioid abuse or dependence: 0.7-3.0% with doses ? 36 MME/day and 6.1-26% with doses ? 120 MME/day. Substance use disorder (SUD) risk level: Pending results of Medical Psychology Evaluation for SUD Opioid risk tool (ORT) (Total Score):    ORT Scoring interpretation table:  Score <3 = Low Risk for SUD  Score between 4-7 = Moderate Risk for SUD  Score >8 = High Risk for Opioid Abuse   PHQ-2 Depression Scale:  Total score:    PHQ-2 Scoring interpretation table: (Score and probability of major depressive disorder)  Score 0 = No depression   Score 1 = 15.4% Probability  Score 2 = 21.1% Probability  Score 3 = 38.4% Probability  Score 4 = 45.5% Probability  Score 5 = 56.4% Probability  Score 6 = 78.6% Probability   PHQ-9 Depression Scale:  Total score:    PHQ-9 Scoring interpretation table:  Score 0-4 = No depression  Score 5-9 = Mild depression  Score 10-14 = Moderate depression  Score 15-19 = Moderately severe depression  Score 20-27 = Severe depression (2.4 times higher risk of SUD and 2.89 times higher risk of overuse)   Pharmacologic Plan: Pending ordered tests and/or consults  Meds  The patient has a current medication list which includes the following prescription(s): aspirin, atorvastatin, baclofen, cholecalciferol, clonazepam, clopidogrel, cyanocobalamin, duloxetine, fluticasone, gabapentin, hydrochlorothiazide, levonorgestrel, metoprolol tartrate, nitroglycerin, nystatin, ondansetron, oxycodone-acetaminophen, pantoprazole, promethazine, torsemide, and trazodone.  Current Outpatient Medications on File Prior to Visit  Medication Sig  . aspirin 81 MG chewable tablet Chew 1 tablet (81 mg total) by mouth daily.  .Marland Kitchenatorvastatin (LIPITOR) 40 MG tablet TAKE 1 TABLET BY MOUTH ONCE DAILY AT 6PM.  . baclofen (LIORESAL) 20 MG tablet Take by mouth.  . cholecalciferol (VITAMIN D) 1000 units tablet Take 5,000 Units by mouth daily.  . clonazePAM (KLONOPIN) 0.5 MG tablet Take 1 tablet (0.5 mg total) by mouth 2 (two) times daily as needed for anxiety. ONLY FOR SEVERE PANIC ATTACKS  . clopidogrel (PLAVIX) 75 MG tablet Take 1 tablet (75 mg total) by mouth daily.  . Cyanocobalamin (VITAMIN B-12 IJ) Inject as directed every 30 (thirty) days.   . DULoxetine (CYMBALTA) 30 MG capsule Take 1 capsule (30 mg total) by mouth daily.  . fluticasone (FLONASE) 50 MCG/ACT nasal spray Place into the nose.  .Marland Kitchen  gabapentin (NEURONTIN) 300 MG capsule Take 900 mg by mouth at bedtime.  . hydrochlorothiazide (HYDRODIURIL) 50 MG tablet Take 50 mg by  mouth as needed.  Marland Kitchen levonorgestrel (MIRENA) 20 MCG/24HR IUD by Intrauterine route.  . metoprolol tartrate (LOPRESSOR) 25 MG tablet Take 0.5 tablets (12.5 mg total) by mouth 2 (two) times daily.  . nitroGLYCERIN (NITROSTAT) 0.4 MG SL tablet Place 1 tablet (0.4 mg total) under the tongue every 5 (five) minutes as needed for chest pain.  Marland Kitchen nystatin (MYCOSTATIN/NYSTOP) powder Apply topically.  . ondansetron (ZOFRAN-ODT) 8 MG disintegrating tablet Take 1 tablet by mouth 3 (three) times daily as needed for nausea.   Marland Kitchen oxyCODONE-acetaminophen (PERCOCET/ROXICET) 5-325 MG tablet Take 1 tablet by mouth every 6 (six) hours as needed for severe pain.  . pantoprazole (PROTONIX) 40 MG tablet TAKE 1 TABLET BY MOUTH ONCE DAILY BEFORE BREAKFAST.  Marland Kitchen promethazine (PHENERGAN) 25 MG tablet Take 25 mg by mouth 3 (three) times daily as needed for nausea.   Marland Kitchen torsemide (DEMADEX) 20 MG tablet   . traZODone (DESYREL) 50 MG tablet Take 1-2 tablets (50-100 mg total) by mouth at bedtime as needed for sleep.   No current facility-administered medications on file prior to visit.    Imaging Review  Note: No new results found.        ROS  Cardiovascular History: Heart trouble, Daily Aspirin intake, Heart attack ( Date: 09/15/17), Heart catheterization and Blood thinners:  Anticoagulant Pulmonary or Respiratory History: No reported pulmonary signs or symptoms such as wheezing and difficulty taking a deep full breath (Asthma), difficulty blowing air out (Emphysema), coughing up mucus (Bronchitis), persistent dry cough, or temporary stoppage of breathing during sleep Neurological History: No reported neurological signs or symptoms such as seizures, abnormal skin sensations, urinary and/or fecal incontinence, being born with an abnormal open spine and/or a tethered spinal cord Review of Past Neurological Studies: No results found for this or any previous visit. Psychological-Psychiatric History: Anxiousness, Depressed, Prone to  panicking and Difficulty sleeping and or falling asleep Gastrointestinal History: Heartburn due to stomach pushing into lungs (Hiatal hernia), Reflux or heatburn and Alternating episodes iof diarrhea and constipation (IBS-Irritable bowe syndrome) Genitourinary History: No reported renal or genitourinary signs or symptoms such as difficulty voiding or producing urine, peeing blood, non-functioning kidney, kidney stones, difficulty emptying the bladder, difficulty controlling the flow of urine, or chronic kidney disease Hematological History: Weakness due to low blood hemoglobin or red blood cell count (Anemia) and Brusing easily Endocrine History: No reported endocrine signs or symptoms such as high or low blood sugar, rapid heart rate due to high thyroid levels, obesity or weight gain due to slow thyroid or thyroid disease Rheumatologic History: Joint aches and or swelling due to excess weight (Osteoarthritis) Musculoskeletal History: Negative for myasthenia gravis, muscular dystrophy, multiple sclerosis or malignant hyperthermia Work History: Working full time  Allergies  Sharon Lawrence is allergic to bee venom; erythromycin; cephalexin; neosporin original [bacitracin-neomycin-polymyxin]; prednisone; sulfa antibiotics; tobrex [tobramycin]; tramadol; and penicillins.  Laboratory Chemistry  Inflammation Markers Lab Results  Component Value Date   CRP <0.8 12/03/2017   ESRSEDRATE 10 12/03/2017   (CRP: Acute Phase) (ESR: Chronic Phase) Renal Function Markers Lab Results  Component Value Date   BUN 9 09/27/2017   CREATININE 0.61 09/27/2017   GFRAA >60 09/27/2017   GFRNONAA >60 09/27/2017   Hepatic Function Markers Lab Results  Component Value Date   AST 87 (H) 10/15/2017   ALT 95 (H) 10/15/2017   ALBUMIN 1.9 (L)  09/26/2017   ALKPHOS 181 (H) 09/26/2017   Electrolytes Lab Results  Component Value Date   NA 140 09/27/2017   K 3.5 09/27/2017   CL 112 (H) 09/27/2017   CALCIUM 7.7 (L)  09/27/2017   MG 1.9 12/03/2017   Neuropathy Markers Lab Results  Component Value Date   VITAMINB12 318 02/14/2011   Bone Pathology Markers Lab Results  Component Value Date   ALKPHOS 181 (H) 09/26/2017   VD25OH  07/20/2008    37 (NOTE) This assay accurately quantifies Vitamin D, which is the sum of the 25-Hydroxy forms of Vitamin D2 and D3.  Studies have shown that the optimum concentration of 25-Hydroxy Vitamin D is 30 ng/mL or higher.  Concentrations of Vitamin D between 20  and 29 ng/mL are considered to be insufficient and concentrations less than 20 ng/mL are considered to be deficient for Vitamin D.   [id number] 47 04/25/2009   [id number] 47 04/25/2009   [id number]  04/25/2009    <8 (NOTE) Vitamin D3, 1,25(OH)2 indicates both endogenous production and supplementation.  Vitamin D2, 1,25(OH)2 is an indicator of exogeous sources, such as diet or supplementation.  Interpretation and therapy are based on measurement of Vitamin  D,1,25(OH)2, Total. This test was developed and its performance characteristics have been determined by [employer], [place], [place]. Performance characteristics refer to the analytical performance of the test.   CALCIUM 7.7 (L) 09/27/2017   Coagulation Parameters Lab Results  Component Value Date   INR 1.14 09/25/2017   LABPROT 14.5 09/25/2017   PLT 597 (H) 10/15/2017   Cardiovascular Markers Lab Results  Component Value Date   HGB 11.5 (L) 10/15/2017   HCT 36.5 10/15/2017   Note: Lab results reviewed.  PFSH  Drug: Sharon Lawrence  reports that she does not use drugs. Alcohol:  reports that she does not drink alcohol. Tobacco:  reports that she quit smoking about 7 months ago. Her smoking use included cigarettes. She has a 3.00 pack-year smoking history. She has never used smokeless tobacco. Medical:  has a past medical history of ADHD (attention deficit hyperactivity disorder), Anxiety, B12 deficiency (02/07/2011), Back pain, CAD  (coronary artery disease), Celiac disease, Folic acid deficiency (02/07/2011), Headache, Iron deficiency anemia (02/07/2011), and Myocardial infarction ([place]) (09/15/2017). Family: family history includes Anxiety disorder in her sister; Bipolar disorder in her son; CAD in her maternal grandfather and paternal grandmother; Cancer in her mother and sister; Cirrhosis in her paternal grandfather; Depression in her sister; Diabetes in her paternal grandmother; Drug abuse in her son; Hypertension in her father and mother; Hypothyroidism in her father and paternal grandmother.  Past Surgical History:  Procedure Laterality Date  . CHOLECYSTECTOMY    . COLONOSCOPY WITH PROPOFOL N/A 09/26/2017   Procedure: COLONOSCOPY WITH PROPOFOL;  Surgeon: Rogene Houston, MD;  [facility]: [facility];  Service: Endoscopy;  Laterality: N/A;  . CORONARY STENT INTERVENTION Right 09/15/2017   Procedure: CORONARY STENT INTERVENTION;  Surgeon: Jettie Booze, MD;  Location: [facility];  Service: Cardiovascular;  Laterality: Right;  RCA  . KNEE CARTILAGE SURGERY Right   . LEFT HEART CATH AND CORONARY ANGIOGRAPHY N/A 09/15/2017   Procedure: LEFT HEART CATH AND CORONARY ANGIOGRAPHY;  Surgeon: Jettie Booze, MD;  Location: [facility];  Service: Cardiovascular;  Laterality: N/A;  . TONSILLECTOMY     Active Ambulatory Problems    Diagnosis Date Noted  . Folic acid deficiency 03/47/4259  . Iron deficiency 02/07/2011  . B12 deficiency 02/07/2011  .  Fatty liver disease, nonalcoholic 96/28/3662  . Vaginal condyloma 06/22/2013  . Obesity 06/22/2013  . Family hx of colon cancer 09/11/2017  . Non-intractable vomiting with nausea 09/11/2017  . Celiac disease 09/11/2017  . Acute ST elevation myocardial infarction (STEMI) of inferior wall ([place]) 09/15/2017  . Hyperlipidemia 09/17/2017  . Tobacco use 09/17/2017  . Hematochezia 09/25/2017  . STEMI (ST elevation myocardial infarction) ([place]), 09/15/17 09/25/2017   . Hypotension 09/25/2017  . Morbidly obese ([name]) 09/25/2017  . LFTs abnormal 09/25/2017  . Chronic diarrhea, with Celiac disease 09/25/2017  . Former smoker   . Hypothyroid 11/06/2017  . History of ST elevation myocardial infarction (STEMI) 11/06/2017  . GI bleed 11/06/2017  . GERD without esophagitis 11/06/2017  . Bleeding per rectum 11/06/2017  . Bilateral lower extremity edema 11/06/2017  . Benign essential hypertension 11/06/2017  . Back muscle spasm 11/06/2017  . Anxiety disorder 11/06/2017  . Anemia 11/06/2017  . ADD (attention deficit disorder) 11/06/2017  . Syncope 11/06/2017  . Sessile colonic polyp 11/06/2017  . Right-sided low back pain with sciatica 11/06/2017  . Refused influenza vaccine 11/06/2017  . RAD (reactive airway disease), moderate persistent, uncomplicated 94/76/5465  . Other chronic pain 11/06/2017  . Migraine 11/06/2017  . Major depressive disorder 11/06/2017  . Insomnia 11/06/2017  . IDA (iron deficiency anemia) 11/06/2017  . Chronic bilateral low back pain with right-sided sciatica (Primary Area of Pain) (R>L) 12/03/2017  . Chronic pain of right lower extremity (Secondary Area of Pain) 12/03/2017  . Chronic sacroiliac joint pain (L>R) 12/03/2017  . Chronic pain syndrome 12/03/2017  . Long term current use of opiate analgesic 12/03/2017  . Pharmacologic therapy 12/03/2017  . Disorder of skeletal system 12/03/2017  . Problems influencing health status 12/03/2017   Resolved Ambulatory Problems    Diagnosis Date Noted  . No Resolved Ambulatory Problems   Past Medical History:  Diagnosis Date  . ADHD (attention deficit hyperactivity disorder)   . Anxiety   . Back pain   . CAD (coronary artery disease)   . Headache   . Iron deficiency anemia 02/07/2011  . Myocardial infarction ([place]) 09/15/2017   Constitutional Exam  General appearance: Well nourished, well developed, and well hydrated. In no apparent acute distress Vitals:   12/03/17 0808   BP: 119/74  Pulse: 71  Resp: 16  Temp: 99 F (37.2 C)  TempSrc: Oral  SpO2: 98%  Weight: 200 lb (90.7 kg)  Height: _0  (1.575 m)   BMI Assessment: Estimated body mass index is 36.58 kg/m as calculated from the following:   Height as of this encounter: _1  (1.575 m).   Weight as of this encounter: 200 lb (90.7 kg).  BMI interpretation table: BMI level Category Range association with higher incidence of chronic pain  <18 kg/m2 Underweight   18.5-24.9 kg/m2 Ideal body weight   25-29.9 kg/m2 Overweight Increased incidence by 20%  30-34.9 kg/m2 Obese (Class I) Increased incidence by 68%  35-39.9 kg/m2 Severe obesity (Class II) Increased incidence by 136%  >40 kg/m2 Extreme obesity (Class III) Increased incidence by 254%   BMI Readings from Last 4 Encounters:  12/03/17 36.58 kg/m  10/15/17 41.05 kg/m  10/10/17 40.62 kg/m  09/26/17 40.95 kg/m   Wt Readings from Last 4 Encounters:  12/03/17 200 lb (90.7 kg)  10/15/17 210 lb 3.2 oz (95.3 kg)  10/10/17 208 lb (94.3 kg)  09/26/17 209 lb 10.5 oz (95.1 kg)  Psych/Mental status: Alert, oriented x 3 (person, place, & time)  Eyes: PERLA Respiratory: No evidence of acute respiratory distress  Cervical Spine Exam  Inspection: No masses, redness, or swelling Alignment: Symmetrical Functional ROM: Unrestricted ROM      Stability: No instability detected Muscle strength & Tone: Functionally intact Sensory: Unimpaired Palpation: No palpable anomalies              Upper Extremity (UE) Exam    Side: Right upper extremity  Side: Left upper extremity  Inspection: No masses, redness, swelling, or asymmetry. No contractures  Inspection: No masses, redness, swelling, or asymmetry. No contractures  Functional ROM: Unrestricted ROM          Functional ROM: Unrestricted ROM          Muscle strength & Tone: Functionally intact  Muscle strength & Tone: Functionally intact  Sensory: Unimpaired  Sensory: Unimpaired  Palpation: No  palpable anomalies              Palpation: No palpable anomalies              Specialized Test(s): Deferred         Specialized Test(s): Deferred          Thoracic Spine Exam  Inspection: No masses, redness, or swelling Alignment: Symmetrical Functional ROM: Unrestricted ROM Stability: No instability detected Sensory: Unimpaired Muscle strength & Tone: No palpable anomalies  Lumbar Spine Exam  Inspection: Moddled skin form  heating pad  Alignment: Symmetrical Functional ROM: Unrestricted ROM      Stability: No instability detected Muscle strength & Tone: Functionally intact Sensory: Unimpaired Palpation: Non-tender       Provocative Tests: Lumbar Hyperextension and rotation test: Negative       Patrick's Maneuver: Positive for left-sided S-I arthralgia              Gait & Posture Assessment  Ambulation: Unassisted Gait: Relatively normal for age and body habitus Posture: WNL   Lower Extremity Exam    Side: Right lower extremity  Side: Left lower extremity  Inspection: No masses, redness, swelling, or asymmetry. No contractures  Inspection: No masses, redness, swelling, or asymmetry. No contractures  Functional ROM: Unrestricted ROM          Functional ROM: Unrestricted ROM          Muscle strength & Tone: Able to Toe-walk & Heel-walk without problems  Muscle strength & Tone: Able to Toe-walk & Heel-walk without problems  Sensory: Unimpaired  Sensory: Unimpaired  Palpation: No palpable anomalies  Palpation: No palpable anomalies   Assessment  Primary Diagnosis & Pertinent Problem List: The primary encounter diagnosis was Chronic bilateral low back pain with right-sided sciatica (Primary Area of Pain) (R>L). Diagnoses of Chronic pain of right lower extremity (Secondary Area of Pain), Chronic sacroiliac joint pain (L>R), Chronic pain syndrome, Long term current use of opiate analgesic, Pharmacologic therapy, Disorder of skeletal system, and Problems influencing health status were  also pertinent to this visit.  Visit Diagnosis: 1. Chronic bilateral low back pain with right-sided sciatica (Primary Area of Pain) (R>L)   2. Chronic pain of right lower extremity (Secondary Area of Pain)   3. Chronic sacroiliac joint pain (L>R)   4. Chronic pain syndrome   5. Long term current use of opiate analgesic   6. Pharmacologic therapy   7. Disorder of skeletal system   8. Problems influencing health status    Plan of Care  Initial treatment plan:  Please be advised that as per protocol, today's visit has been an evaluation only. We  have not taken over the patient's controlled substance management.  Problem-specific plan: No problem-specific Assessment & Plan notes found for this encounter.  Ordered Lab-work, Procedure(s), Referral(s), & Consult(s): Orders Placed This Encounter  Procedures  . DG Si Joints  . Compliance Drug Analysis, Ur  . Magnesium  . Sedimentation rate  . C-reactive protein   Pharmacotherapy: Medications ordered:  No orders of the defined types were placed in this encounter.  Medications administered during this visit: Carina K. Creelman had no medications administered during this visit.   Pharmacotherapy under consideration:  Opioid Analgesics: The patient was informed that there is no guarantee that she would be a candidate for opioid analgesics. The decision will be made following CDC guidelines. This decision will be based on the results of diagnostic studies, as well as Sharon Lawrence's risk profile.  Membrane stabilizer: To be determined at a later time Muscle relaxant: To be determined at a later time NSAID: To be determined at a later time Other analgesic(s): To be determined at a later time   Interventional therapies under consideration: Sharon Lawrence was informed that there is no guarantee that she would be a candidate for interventional therapies. The decision will be based on the results of diagnostic studies, as well as Sharon Lawrence's risk profile.   Possible procedure(s): Diagnostic bilateral lumbar facet nerve block Possible bilateral lumbar facet radiofrequency ablation Diagnostic sacroiliac joint nerve block Possible sacroiliac joint radiofrequency ablation   Provider-requested follow-up: Return for 2nd Visit, w/ Dr. Dossie Arbour.  Future Appointments  Date Time Provider [place]  12/19/2017  9:00 AM Alden Hipp, LCSW [facility] None  12/19/2017 10:15 AM Ursula Alert, MD [facility] None  12/29/2017  1:00 PM Milinda Pointer, MD [facility] None  01/16/2018  2:00 PM Harl Bowie Alphonse Guild, MD [facility]  10/19/2018  9:30 AM Vaughan Basta, Rona Ravens, NP [facility] None    Primary Care Physician: Vidal Schwalbe, MD Location: [facility] Note by:  Date: 12/03/2017; Time: 2:19 PM  Pain Score Disclaimer: We use the NRS-11 scale. This is a self-reported, subjective measurement of pain severity with only modest accuracy. It is used primarily to identify changes within a particular patient. It must be understood that outpatient pain scales are significantly less accurate that those used for research, where they can be applied under ideal controlled circumstances with minimal exposure to variables. In reality, the score is likely to be a combination of pain intensity and pain affect, where pain affect describes the degree of emotional arousal or changes in action readiness caused by the sensory experience of pain. Factors such as social and work situation, setting, emotional state, anxiety levels, expectation, and prior pain experience may influence pain perception and show large inter-individual differences that may also be affected by time variables.  Patient instructions provided during this appointment: Patient Instructions   ____________________________________________________________________________________________  Appointment Policy Summary  It is our goal and responsibility to provide the  medical community with assistance in the evaluation and management of patients with chronic pain. Unfortunately our resources are limited. Because we do not have an unlimited amount of time, or available appointments, we are required to closely monitor and manage their use. The following rules exist to maximize their use:  Patient's responsibilities: 1. Punctuality:  At what time should I arrive? You should be physically present in our office 30 minutes before your scheduled appointment. Your scheduled appointment is with your assigned healthcare provider. However, it takes 5-10 minutes to be "checked-in", and another 15  minutes for the nurses to do the admission. If you arrive to our office at the time you were given for your appointment, you will end up being at least 20-25 minutes late to your appointment with the provider. 2. Tardiness:  What happens if I arrive only a few minutes after my scheduled appointment time? You will need to reschedule your appointment. The cutoff is your appointment time. This is why it is so important that you arrive at least 30 minutes before that appointment. If you have an appointment scheduled for 10:00 AM and you arrive at 10:01, you will be required to reschedule your appointment.  3. Plan ahead:  Always assume that you will encounter traffic on your way in. Plan for it. If you are dependent on a driver, make sure they understand these rules and the need to arrive early. 4. Other appointments and responsibilities:  Avoid scheduling any other appointments before or after your pain clinic appointments.  5. Be prepared:  Write down everything that you need to discuss with your healthcare provider and give this information to the admitting nurse. Write down the medications that you will need refilled. Bring your pills and bottles (even the empty ones), to all of your appointments, except for those where a procedure is scheduled. 6. No children or pets:  Find someone to  take care of them. It is not appropriate to bring them in. 7. Scheduling changes:  We request "advanced notification" of any changes or cancellations. 8. Advanced notification:  Defined as a time period of more than 24 hours prior to the originally scheduled appointment. This allows for the appointment to be offered to other patients. 9. Rescheduling:  When a visit is rescheduled, it will require the cancellation of the original appointment. For this reason they both fall within the category of "Cancellations".  10. Cancellations:  They require advanced notification. Any cancellation less than 24 hours before the  appointment will be recorded as a "No Show". 11. No Show:  Defined as an unkept appointment where the patient failed to notify or declare to the practice their intention or inability to keep the appointment.  Corrective process for repeat offenders:  1. Tardiness: Three (3) episodes of rescheduling due to late arrivals will be recorded as one (1) "No Show". 2. Cancellation or reschedule: Three (3) cancellations or rescheduling will be recorded as one (1) "No Show". 3. "No Shows": Three (3) "No Shows" within a 12 month period will result in discharge from the practice. ____________________________________________________________________________________________  ____________________________________________________________________________________________  Pain Scale  Introduction: The pain score used by this practice is the Verbal Numerical Rating Scale (VNRS-11). This is an 11-point scale. It is for adults and children 10 years or older. There are significant differences in how the pain score is reported, used, and applied. Forget everything you learned in the past and learn this scoring system.  General Information: The scale should reflect your current level of pain. Unless you are specifically asked for the level of your worst pain, or your average pain. If you are asked for one of  these two, then it should be understood that it is over the past 24 hours.  Basic Activities of Daily Living (ADL): Personal hygiene, dressing, eating, transferring, and using restroom.  Instructions: Most patients tend to report their level of pain as a combination of two factors, their physical pain and their psychosocial pain. This last one is also known as "suffering" and it is reflection of how physical pain affects you  socially and psychologically. From now on, report them separately. From this point on, when asked to report your pain level, report only your physical pain. Use the following table for reference.  Pain Clinic Pain Levels (0-5/10)  Pain Level Score  Description  No Pain 0   Mild pain 1 Nagging, annoying, but does not interfere with basic activities of daily living (ADL). Patients are able to eat, bathe, get dressed, toileting (being able to get on and off the toilet and perform personal hygiene functions), transfer (move in and out of bed or a chair without assistance), and maintain continence (able to control bladder and bowel functions). Blood pressure and heart rate are unaffected. A normal heart rate for a healthy adult ranges from 60 to 100 bpm (beats per minute).   Mild to moderate pain 2 Noticeable and distracting. Impossible to hide from other people. More frequent flare-ups. Still possible to adapt and function close to normal. It can be very annoying and may have occasional stronger flare-ups. With discipline, patients may get used to it and adapt.   Moderate pain 3 Interferes significantly with activities of daily living (ADL). It becomes difficult to feed, bathe, get dressed, get on and off the toilet or to perform personal hygiene functions. Difficult to get in and out of bed or a chair without assistance. Very distracting. With effort, it can be ignored when deeply involved in activities.   Moderately severe pain 4 Impossible to ignore for more than a few minutes.  With effort, patients may still be able to manage work or participate in some social activities. Very difficult to concentrate. Signs of autonomic nervous system discharge are evident: dilated pupils (mydriasis); mild sweating (diaphoresis); sleep interference. Heart rate becomes elevated (>115 bpm). Diastolic blood pressure (lower number) rises above 100 mmHg. Patients find relief in laying down and not moving.   Severe pain 5 Intense and extremely unpleasant. Associated with frowning face and frequent crying. Pain overwhelms the senses.  Ability to do any activity or maintain social relationships becomes significantly limited. Conversation becomes difficult. Pacing back and forth is common, as getting into a comfortable position is nearly impossible. Pain wakes you up from deep sleep. Physical signs will be obvious: pupillary dilation; increased sweating; goosebumps; brisk reflexes; cold, clammy hands and feet; nausea, vomiting or dry heaves; loss of appetite; significant sleep disturbance with inability to fall asleep or to remain asleep. When persistent, significant weight loss is observed due to the complete loss of appetite and sleep deprivation.  Blood pressure and heart rate becomes significantly elevated. Caution: If elevated blood pressure triggers a pounding headache associated with blurred vision, then the patient should immediately seek attention at an urgent or emergency care unit, as these may be signs of an impending stroke.    Emergency Department Pain Levels (6-10/10)  Emergency Room Pain 6 Severely limiting. Requires emergency care and should not be seen or managed at an outpatient pain management facility. Communication becomes difficult and requires great effort. Assistance to reach the emergency department may be required. Facial flushing and profuse sweating along with potentially dangerous increases in heart rate and blood pressure will be evident.   Distressing pain 7 Self-care is  very difficult. Assistance is required to transport, or use restroom. Assistance to reach the emergency department will be required. Tasks requiring coordination, such as bathing and getting dressed become very difficult.   Disabling pain 8 Self-care is no longer possible. At this level, pain is disabling. The individual is unable  to do even the most "basic" activities such as walking, eating, bathing, dressing, transferring to a bed, or toileting. Fine motor skills are lost. It is difficult to think clearly.   Incapacitating pain 9 Pain becomes incapacitating. Thought processing is no longer possible. Difficult to remember your own name. Control of movement and coordination are lost.   The worst pain imaginable 10 At this level, most patients pass out from pain. When this level is reached, collapse of the autonomic nervous system occurs, leading to a sudden drop in blood pressure and heart rate. This in turn results in a temporary and dramatic drop in blood flow to the brain, leading to a loss of consciousness. Fainting is one of the body's self defense mechanisms. Passing out puts the brain in a calmed state and causes it to shut down for a while, in order to begin the healing process.    Summary: 1. Refer to this scale when providing [place] with your pain level. 2. Be accurate and careful when reporting your pain level. This will help with your care. 3. Over-reporting your pain level will lead to loss of credibility. 4. Even a level of 1/10 means that there is pain and will be treated at our facility. 5. High, inaccurate reporting will be documented as "Symptom Exaggeration", leading to loss of credibility and suspicions of possible secondary gains such as obtaining more narcotics, or wanting to appear disabled, for fraudulent reasons. 6. Only pain levels of 5 or below will be seen at our facility. 7. Pain levels of 6 and above will be sent to the Emergency Department and the appointment  cancelled. ____________________________________________________________________________________________   BMI Assessment: Estimated body mass index is 36.58 kg/m as calculated from the following:   Height as of this encounter: _0  (1.575 m).   Weight as of this encounter: 200 lb (90.7 kg).  BMI interpretation table: BMI level Category Range association with higher incidence of chronic pain  <18 kg/m2 Underweight   18.5-24.9 kg/m2 Ideal body weight   25-29.9 kg/m2 Overweight Increased incidence by 20%  30-34.9 kg/m2 Obese (Class I) Increased incidence by 68%  35-39.9 kg/m2 Severe obesity (Class II) Increased incidence by 136%  >40 kg/m2 Extreme obesity (Class III) Increased incidence by 254%   BMI Readings from Last 4 Encounters:  12/03/17 36.58 kg/m  10/15/17 41.05 kg/m  10/10/17 40.62 kg/m  09/26/17 40.95 kg/m   Wt Readings from Last 4 Encounters:  12/03/17 200 lb (90.7 kg)  10/15/17 210 lb 3.2 oz (95.3 kg)  10/10/17 208 lb (94.3 kg)  09/26/17 209 lb 10.5 oz (95.1 kg)

## 2017-12-03 NOTE — Patient Instructions (Addendum)
____________________________________________________________________________________________  Appointment Policy Summary  It is our goal and responsibility to provide the medical community with assistance in the evaluation and management of patients with chronic pain. Unfortunately our resources are limited. Because we do not have an unlimited amount of time, or available appointments, we are required to closely monitor and manage their use. The following rules exist to maximize their use:  Patient's responsibilities: 1. Punctuality:  At what time should I arrive? You should be physically present in our office 30 minutes before your scheduled appointment. Your scheduled appointment is with your assigned healthcare provider. However, it takes 5-10 minutes to be "checked-in", and another 15 minutes for the nurses to do the admission. If you arrive to our office at the time you were given for your appointment, you will end up being at least 20-25 minutes late to your appointment with the provider. 2. Tardiness:  What happens if I arrive only a few minutes after my scheduled appointment time? You will need to reschedule your appointment. The cutoff is your appointment time. This is why it is so important that you arrive at least 30 minutes before that appointment. If you have an appointment scheduled for 10:00 AM and you arrive at 10:01, you will be required to reschedule your appointment.  3. Plan ahead:  Always assume that you will encounter traffic on your way in. Plan for it. If you are dependent on a driver, make sure they understand these rules and the need to arrive early. 4. Other appointments and responsibilities:  Avoid scheduling any other appointments before or after your pain clinic appointments.  5. Be prepared:  Write down everything that you need to discuss with your healthcare provider and give this information to the admitting nurse. Write down the medications that you will need  refilled. Bring your pills and bottles (even the empty ones), to all of your appointments, except for those where a procedure is scheduled. 6. No children or pets:  Find someone to take care of them. It is not appropriate to bring them in. 7. Scheduling changes:  We request "advanced notification" of any changes or cancellations. 8. Advanced notification:  Defined as a time period of more than 24 hours prior to the originally scheduled appointment. This allows for the appointment to be offered to other patients. 9. Rescheduling:  When a visit is rescheduled, it will require the cancellation of the original appointment. For this reason they both fall within the category of "Cancellations".  10. Cancellations:  They require advanced notification. Any cancellation less than 24 hours before the  appointment will be recorded as a "No Show". 11. No Show:  Defined as an unkept appointment where the patient failed to notify or declare to the practice their intention or inability to keep the appointment.  Corrective process for repeat offenders:  1. Tardiness: Three (3) episodes of rescheduling due to late arrivals will be recorded as one (1) "No Show". 2. Cancellation or reschedule: Three (3) cancellations or rescheduling will be recorded as one (1) "No Show". 3. "No Shows": Three (3) "No Shows" within a 12 month period will result in discharge from the practice. ____________________________________________________________________________________________  ____________________________________________________________________________________________  Pain Scale  Introduction: The pain score used by this practice is the Verbal Numerical Rating Scale (VNRS-11). This is an 11-point scale. It is for adults and children 10 years or older. There are significant differences in how the pain score is reported, used, and applied. Forget everything you learned in the past and learn  this scoring system.  General  Information: The scale should reflect your current level of pain. Unless you are specifically asked for the level of your worst pain, or your average pain. If you are asked for one of these two, then it should be understood that it is over the past 24 hours.  Basic Activities of Daily Living (ADL): Personal hygiene, dressing, eating, transferring, and using restroom.  Instructions: Most patients tend to report their level of pain as a combination of two factors, their physical pain and their psychosocial pain. This last one is also known as "suffering" and it is reflection of how physical pain affects you socially and psychologically. From now on, report them separately. From this point on, when asked to report your pain level, report only your physical pain. Use the following table for reference.  Pain Clinic Pain Levels (0-5/10)  Pain Level Score  Description  No Pain 0   Mild pain 1 Nagging, annoying, but does not interfere with basic activities of daily living (ADL). Patients are able to eat, bathe, get dressed, toileting (being able to get on and off the toilet and perform personal hygiene functions), transfer (move in and out of bed or a chair without assistance), and maintain continence (able to control bladder and bowel functions). Blood pressure and heart rate are unaffected. A normal heart rate for a healthy adult ranges from 60 to 100 bpm (beats per minute).   Mild to moderate pain 2 Noticeable and distracting. Impossible to hide from other people. More frequent flare-ups. Still possible to adapt and function close to normal. It can be very annoying and may have occasional stronger flare-ups. With discipline, patients may get used to it and adapt.   Moderate pain 3 Interferes significantly with activities of daily living (ADL). It becomes difficult to feed, bathe, get dressed, get on and off the toilet or to perform personal hygiene functions. Difficult to get in and out of bed or a chair  without assistance. Very distracting. With effort, it can be ignored when deeply involved in activities.   Moderately severe pain 4 Impossible to ignore for more than a few minutes. With effort, patients may still be able to manage work or participate in some social activities. Very difficult to concentrate. Signs of autonomic nervous system discharge are evident: dilated pupils (mydriasis); mild sweating (diaphoresis); sleep interference. Heart rate becomes elevated (>115 bpm). Diastolic blood pressure (lower number) rises above 100 mmHg. Patients find relief in laying down and not moving.   Severe pain 5 Intense and extremely unpleasant. Associated with frowning face and frequent crying. Pain overwhelms the senses.  Ability to do any activity or maintain social relationships becomes significantly limited. Conversation becomes difficult. Pacing back and forth is common, as getting into a comfortable position is nearly impossible. Pain wakes you up from deep sleep. Physical signs will be obvious: pupillary dilation; increased sweating; goosebumps; brisk reflexes; cold, clammy hands and feet; nausea, vomiting or dry heaves; loss of appetite; significant sleep disturbance with inability to fall asleep or to remain asleep. When persistent, significant weight loss is observed due to the complete loss of appetite and sleep deprivation.  Blood pressure and heart rate becomes significantly elevated. Caution: If elevated blood pressure triggers a pounding headache associated with blurred vision, then the patient should immediately seek attention at an urgent or emergency care unit, as these may be signs of an impending stroke.    Emergency Department Pain Levels (6-10/10)  Emergency Room Pain 6 Severely   limiting. Requires emergency care and should not be seen or managed at an outpatient pain management facility. Communication becomes difficult and requires great effort. Assistance to reach the emergency department  may be required. Facial flushing and profuse sweating along with potentially dangerous increases in heart rate and blood pressure will be evident.   Distressing pain 7 Self-care is very difficult. Assistance is required to transport, or use restroom. Assistance to reach the emergency department will be required. Tasks requiring coordination, such as bathing and getting dressed become very difficult.   Disabling pain 8 Self-care is no longer possible. At this level, pain is disabling. The individual is unable to do even the most "basic" activities such as walking, eating, bathing, dressing, transferring to a bed, or toileting. Fine motor skills are lost. It is difficult to think clearly.   Incapacitating pain 9 Pain becomes incapacitating. Thought processing is no longer possible. Difficult to remember your own name. Control of movement and coordination are lost.   The worst pain imaginable 10 At this level, most patients pass out from pain. When this level is reached, collapse of the autonomic nervous system occurs, leading to a sudden drop in blood pressure and heart rate. This in turn results in a temporary and dramatic drop in blood flow to the brain, leading to a loss of consciousness. Fainting is one of the body's self defense mechanisms. Passing out puts the brain in a calmed state and causes it to shut down for a while, in order to begin the healing process.    Summary: 1. Refer to this scale when providing [place] with your pain level. 2. Be accurate and careful when reporting your pain level. This will help with your care. 3. Over-reporting your pain level will lead to loss of credibility. 4. Even a level of 1/10 means that there is pain and will be treated at our facility. 5. High, inaccurate reporting will be documented as "Symptom Exaggeration", leading to loss of credibility and suspicions of possible secondary gains such as obtaining more narcotics, or wanting to appear disabled, for  fraudulent reasons. 6. Only pain levels of 5 or below will be seen at our facility. 7. Pain levels of 6 and above will be sent to the Emergency Department and the appointment cancelled. ____________________________________________________________________________________________   BMI Assessment: Estimated body mass index is 36.58 kg/m as calculated from the following:   Height as of this encounter: 5\' 2"  (1.575 m).   Weight as of this encounter: 200 lb (90.7 kg).  BMI interpretation table: BMI level Category Range association with higher incidence of chronic pain  <18 kg/m2 Underweight   18.5-24.9 kg/m2 Ideal body weight   25-29.9 kg/m2 Overweight Increased incidence by 20%  30-34.9 kg/m2 Obese (Class I) Increased incidence by 68%  35-39.9 kg/m2 Severe obesity (Class II) Increased incidence by 136%  >40 kg/m2 Extreme obesity (Class III) Increased incidence by 254%   BMI Readings from Last 4 Encounters:  12/03/17 36.58 kg/m  10/15/17 41.05 kg/m  10/10/17 40.62 kg/m  09/26/17 40.95 kg/m   Wt Readings from Last 4 Encounters:  12/03/17 200 lb (90.7 kg)  10/15/17 210 lb 3.2 oz (95.3 kg)  10/10/17 208 lb (94.3 kg)  09/26/17 209 lb 10.5 oz (95.1 kg)

## 2017-12-09 LAB — COMPLIANCE DRUG ANALYSIS, UR

## 2017-12-19 ENCOUNTER — Ambulatory Visit (INDEPENDENT_AMBULATORY_CARE_PROVIDER_SITE_OTHER): Payer: 59 | Admitting: Licensed Clinical Social Worker

## 2017-12-19 ENCOUNTER — Encounter: Payer: Self-pay | Admitting: Psychiatry

## 2017-12-19 ENCOUNTER — Ambulatory Visit (INDEPENDENT_AMBULATORY_CARE_PROVIDER_SITE_OTHER): Payer: 59 | Admitting: Psychiatry

## 2017-12-19 ENCOUNTER — Encounter: Payer: Self-pay | Admitting: Licensed Clinical Social Worker

## 2017-12-19 VITALS — BP 102/64 | HR 68 | Ht 60.25 in | Wt 199.0 lb

## 2017-12-19 DIAGNOSIS — F411 Generalized anxiety disorder: Secondary | ICD-10-CM | POA: Diagnosis not present

## 2017-12-19 DIAGNOSIS — F410 Panic disorder [episodic paroxysmal anxiety] without agoraphobia: Secondary | ICD-10-CM | POA: Diagnosis not present

## 2017-12-19 DIAGNOSIS — F330 Major depressive disorder, recurrent, mild: Secondary | ICD-10-CM

## 2017-12-19 DIAGNOSIS — F5105 Insomnia due to other mental disorder: Secondary | ICD-10-CM

## 2017-12-19 MED ORDER — TRAZODONE HCL 100 MG PO TABS: 100.0000 mg | ORAL_TABLET | Freq: Every evening | ORAL | 1 refills | Status: DC | PRN

## 2017-12-19 MED ORDER — DULOXETINE HCL 30 MG PO CPEP: 30.0000 mg | ORAL_CAPSULE | Freq: Two times a day (BID) | ORAL | 1 refills | Status: DC
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
## 2017-12-19 NOTE — Progress Notes (Signed)
[facility] OP Progress Note  12/19/2017 10:29 AM Sharon Lawrence  MRN:  [id number]  Chief Complaint: ' I am here for follow up." Chief Complaint    Follow-up     HPI: Reagen is a 52 year old female who is widowed, employed as a Marine scientist, lives in [place], has a history of coronary artery disease status post STEMI on 09/15/2017.  Status post stent placement, hyperlipidemia, chronic anemia, presented to the clinic today for a follow-up visit.  Patient today reports that she is tolerating the Cymbalta well.  She reports she does not have any significant nightmares that she had on the Cymbalta previously.  She however reports her anxiety symptoms has been worsening since the past few days.  She reports its more situational.  She reports her son got arrested and is currently in jail.  She reports he abuses drugs and hence it has been a rough few days for her trying to figure out what to do.  She reports she wants to get her son to get substance abuse treatment.  She however reports she does not know if he is going to be ready for that or not.  She reports he has been this way since he was 52 years old.  Pt  reports she is interested in increasing her Cymbalta dosage.  She reports she continues to take Klonopin in the morning at least 5 days a week and since the past 2 weeks has been taking it regularly at bedtime since she has been having sleep problems.  Discussed with patient not to use it every day due to its addictive potential.  Discussed with patient that her Cymbalta as well as trazodone for sleep can be readjusted today.  Patient has started psychotherapy sessions with Ms. Alden Hipp our therapist here in clinic.  She reports she has upcoming appointment in 1 week.  She looks forward to the same.  Patient denies any suicidality.  Patient denies any perceptual disturbances.  Patient denies any homicidality.   Visit Diagnosis:    ICD-10-CM   1. Panic disorder F41.0 DULoxetine (CYMBALTA) 30 MG capsule     traZODone (DESYREL) 100 MG tablet  2. GAD (generalized anxiety disorder) F41.1 DULoxetine (CYMBALTA) 30 MG capsule    traZODone (DESYREL) 100 MG tablet  3. MDD (major depressive disorder), recurrent episode, mild (HCC) F33.0 DULoxetine (CYMBALTA) 30 MG capsule  4. Insomnia due to mental disorder F51.05 traZODone (DESYREL) 100 MG tablet    Past Psychiatric History: Have reviewed past psychiatric history from my progress note on 11/17/2017.  Past trials of Zoloft, Paxil  Past Medical History:  Past Medical History:  Diagnosis Date  . ADHD (attention deficit hyperactivity disorder)   . Anxiety   . B12 deficiency 02/07/2011  . Back pain   . CAD (coronary artery disease)    a. s/p recent STEMI on 09/15/2017 with DES to RCA  . Celiac disease   . Folic acid deficiency 27/07/1698  . Headache   . Iron deficiency anemia 02/07/2011  . Myocardial infarction ([place]) 09/15/2017    Past Surgical History:  Procedure Laterality Date  . CHOLECYSTECTOMY    . COLONOSCOPY WITH PROPOFOL N/A 09/26/2017   Procedure: COLONOSCOPY WITH PROPOFOL;  Surgeon: Rogene Houston, MD;  Location: [facility];  Service: Endoscopy;  Laterality: N/A;  . CORONARY STENT INTERVENTION Right 09/15/2017   Procedure: CORONARY STENT INTERVENTION;  Surgeon: Jettie Booze, MD;  Location: [facility];  Service: Cardiovascular;  Laterality: Right;  RCA  .  KNEE CARTILAGE SURGERY Right   . LEFT HEART CATH AND CORONARY ANGIOGRAPHY N/A 09/15/2017   Procedure: LEFT HEART CATH AND CORONARY ANGIOGRAPHY;  Surgeon: Jettie Booze, MD;  Location: [facility];  Service: Cardiovascular;  Laterality: N/A;  . TONSILLECTOMY      Family Psychiatric History: Reviewed family psychiatric history from my progress note on 11/17/2017  Family History:  Family History  Problem Relation Age of Onset  . Cancer Mother   . Hypertension Mother   . Hypertension Father   . Hypothyroidism Father   . Cancer Sister   . Anxiety disorder  Sister   . Depression Sister   . CAD Maternal Grandfather   . Cirrhosis Paternal Grandfather   . CAD Paternal Grandmother   . Diabetes Paternal Grandmother   . Hypothyroidism Paternal Grandmother   . Drug abuse Son   . Bipolar disorder Son     Social History: Reviewed social history from my progress note on 11/17/2017  Social History   Socioeconomic History  . Marital status: Widowed    Spouse name: Not on file  . Number of children: 3  . Years of education: Not on file  . Highest education level: Associate degree: occupational, Hotel manager, or vocational program  Occupational History  . Not on file  Social Needs  . Financial resource strain: Not hard at all  . Food insecurity:    Worry: Never true    Inability: Never true  . Transportation needs:    Medical: No    Non-medical: No  Tobacco Use  . Smoking status: Former Smoker    Packs/day: 0.25    Years: 12.00    Pack years: 3.00    Types: Cigarettes    Last attempt to quit: 04/04/2017    Years since quitting: 0.7  . Smokeless tobacco: Never Used  Substance and Sexual Activity  . Alcohol use: No  . Drug use: No  . Sexual activity: Not Currently  Lifestyle  . Physical activity:    Days per week: 3 days    Minutes per session: 30 min  . Stress: Very much  Relationships  . Social connections:    Talks on phone: More than three times a week    Gets together: More than three times a week    Attends religious service: More than 4 times per year    Active member of club or organization: No    Attends meetings of clubs or organizations: Never    Relationship status: Widowed  Other Topics Concern  . Not on file  Social History Narrative  . Not on file    Allergies:  Allergies  Allergen Reactions  . Bee Venom Anaphylaxis  . Erythromycin Anaphylaxis  . Cephalexin   . Neosporin Original [Bacitracin-Neomycin-Polymyxin] Swelling    Swelling at site of application  . Prednisone   . Sulfa Antibiotics Swelling     Sulfa eye drops caused eyes to swell  . Tobrex [Tobramycin] Swelling  . Tramadol Hives  . Penicillins Rash    Has patient had a PCN reaction causing immediate rash, facial/tongue/throat swelling, SOB or lightheadedness with hypotension: Yes Has patient had a PCN reaction causing severe rash involving mucus membranes or skin necrosis: No Has patient had a PCN reaction that required hospitalization: No Has patient had a PCN reaction occurring within the last 10 years: No If all of the above answers are "NO", then may proceed with Cephalosporin use.     Metabolic Disorder Labs: Lab Results  Component Value Date   HGBA1C 5.6 09/16/2017   MPG 114 09/16/2017   MPG 123 (H) 07/07/2013   No results found for: PROLACTIN Lab Results  Component Value Date   CHOL 77 09/16/2017   TRIG 64 09/16/2017   HDL 19 (L) 09/16/2017   CHOLHDL 4.1 09/16/2017   VLDL 13 09/16/2017   LDLCALC 45 09/16/2017   LDLCALC 97 07/07/2013   Lab Results  Component Value Date   TSH 2.471 07/07/2013   TSH 1.745 06/03/2012    Therapeutic Level Labs: No results found for: LITHIUM No results found for: VALPROATE No components found for:  CBMZ  Current Medications: Current Outpatient Medications  Medication Sig Dispense Refill  . aspirin 81 MG chewable tablet Chew 1 tablet (81 mg total) by mouth daily.    Marland Kitchen atorvastatin (LIPITOR) 40 MG tablet TAKE 1 TABLET BY MOUTH ONCE DAILY AT 6PM. 30 tablet 6  . baclofen (LIORESAL) 20 MG tablet Take by mouth.    . cholecalciferol (VITAMIN D) 1000 units tablet Take 5,000 Units by mouth daily.    . clonazePAM (KLONOPIN) 0.5 MG tablet Take 1 tablet (0.5 mg total) by mouth 2 (two) times daily as needed for anxiety. ONLY FOR SEVERE PANIC ATTACKS 60 tablet 1  . clopidogrel (PLAVIX) 75 MG tablet Take 1 tablet (75 mg total) by mouth daily. 90 tablet 3  . Cyanocobalamin (VITAMIN B-12 IJ) Inject as directed every 30 (thirty) days.     . DULoxetine (CYMBALTA) 30 MG capsule Take 1  capsule (30 mg total) by mouth 2 (two) times daily. 60 capsule 1  . fluticasone (FLONASE) 50 MCG/ACT nasal spray Place into the nose.    . gabapentin (NEURONTIN) 300 MG capsule Take 900 mg by mouth at bedtime.    . lactobacillus acidophilus (BACID) TABS tablet Take 1 tablet by mouth daily.    Marland Kitchen levonorgestrel (MIRENA) 20 MCG/24HR IUD by Intrauterine route.    . metoprolol tartrate (LOPRESSOR) 25 MG tablet Take 0.5 tablets (12.5 mg total) by mouth 2 (two) times daily. 90 tablet 3  . nitroGLYCERIN (NITROSTAT) 0.4 MG SL tablet Place 1 tablet (0.4 mg total) under the tongue every 5 (five) minutes as needed for chest pain. 25 tablet 2  . nystatin (MYCOSTATIN/NYSTOP) powder Apply topically.    . ondansetron (ZOFRAN-ODT) 8 MG disintegrating tablet Take 1 tablet by mouth 3 (three) times daily as needed for nausea.   3  . oxyCODONE-acetaminophen (PERCOCET/ROXICET) 5-325 MG tablet Take 1 tablet by mouth every 6 (six) hours as needed for severe pain.    . pantoprazole (PROTONIX) 40 MG tablet TAKE 1 TABLET BY MOUTH ONCE DAILY BEFORE BREAKFAST. 30 tablet 6  . promethazine (PHENERGAN) 25 MG tablet Take 25 mg by mouth 3 (three) times daily as needed for nausea.   2  . ranitidine (ZANTAC) 150 MG tablet Take 150 mg by mouth daily.    Marland Kitchen torsemide (DEMADEX) 20 MG tablet   2  . hydrochlorothiazide (HYDRODIURIL) 50 MG tablet Take 50 mg by mouth as needed.    . traZODone (DESYREL) 100 MG tablet Take 1-1.5 tablets (100-150 mg total) by mouth at bedtime as needed for sleep. 45 tablet 1   No current facility-administered medications for this visit.      Musculoskeletal: Strength & Muscle Tone: within normal limits Gait & Station: normal Patient leans: N/A  Psychiatric Specialty Exam: Review of Systems  Psychiatric/Behavioral: Positive for depression. The patient is nervous/anxious and has insomnia.   All other systems reviewed and  are negative.   Blood pressure 102/64, pulse 68, height 5' 0.25" (1.53 m),  weight 199 lb (90.3 kg), SpO2 94 %.Body mass index is 38.54 kg/m.  General Appearance: Casual  Eye Contact:  Fair  Speech:  Clear and Coherent  Volume:  Normal  Mood:  Anxious and Dysphoric  Affect:  Congruent  Thought Process:  Goal Directed and Descriptions of Associations: Intact  Orientation:  Full (Time, Place, and Person)  Thought Content: Logical   Suicidal Thoughts:  No  Homicidal Thoughts:  No  Memory:  Immediate;   Fair Recent;   Fair Remote;   Fair  Judgement:  Fair  Insight:  Fair  Psychomotor Activity:  Normal  Concentration:  Concentration: Fair and Attention Span: Fair  Recall:  [employer] of Knowledge: Fair  Language: Fair  Akathisia:  No  Handed:  Right  AIMS (if indicated):na  Assets:  Communication Skills Desire for Improvement Social Support  ADL's:  Intact  Cognition: WNL  Sleep:  Poor   Screenings:   Assessment and Plan: Elmo is a 52 yr old patient female, widowed, lives in [place], employed, has a history of depression, anxiety, insomnia, hyperlipidemia, coronary artery disease, recent myocardial infarction status post stent placement, presented to the clinic today for a follow-up visit.  Patient is biologically predisposed given her family history as well as her own health problems.  Patient continues to have several psychosocial stressors including recent legal issues with her son.  Patient has started psychotherapy sessions and is motivated to continue therapy.  Will make the following medication readjustment.  Plan Panic disorder Increase Cymbalta to 30 mg p.o. twice daily Discussed with patient to continue Klonopin 0.5 mg p.o. twice daily as needed.  Discussed with patient to skip the Klonopin at least a few days a week due to its addictive potential. She will continue psychotherapy sessions.  For GAD Cymbalta and Klonopin as prescribed  MDD Cymbalta as prescribed  For insomnia Increase trazodone to 100-150 mg p.o. nightly as  needed  Follow-up in clinic in 3-4 weeks or sooner if needed.  More than 50 % of the time was spent for psychoeducation and supportive psychotherapy and care coordination.  This note was generated in part or whole with voice recognition software. Voice recognition is usually quite accurate but there are transcription errors that can and very often do occur. I apologize for any typographical errors that were not detected and corrected.         Ursula Alert, MD 12/19/2017, 10:29 AM

## 2017-12-25 NOTE — Progress Notes (Signed)
Patient's Name: Sharon Lawrence  MRN: [id number]  Referring Provider: Vidal Schwalbe, MD  DOB: [birth year]  PCP: Vidal Schwalbe, MD  DOS: 12/29/2017  Note by: Gaspar Cola, MD  Service setting: Ambulatory outpatient  Specialty: Interventional Pain Management  Location: [facility] (AMB) Pain Management Facility    Patient type: Established   Primary Reason(s) for Visit: Encounter for evaluation before starting new chronic pain management plan of care (Level of risk: moderate) CC: Back Pain (lower, right is worse )  HPI  [name] is a 52 y.o. year old, female patient, who comes today for a follow-up evaluation to review the test results and decide on a treatment plan. She has Folic acid deficiency; Iron deficiency; B12 deficiency; Fatty liver disease, nonalcoholic; Vaginal condyloma; Obesity; Family hx of colon cancer; Non-intractable vomiting with nausea; Celiac disease; Acute ST elevation myocardial infarction (STEMI) of inferior wall ([place]); Hyperlipidemia; Tobacco use; Hematochezia; STEMI (ST elevation myocardial infarction) ([place]), 09/15/17; Hypotension; Morbidly obese ([place]); Elevated LFTs; Chronic diarrhea, with Celiac disease; Former smoker; Hypothyroid; History of ST elevation myocardial infarction (STEMI); GI bleed; GERD without esophagitis; Bleeding per rectum; Bilateral lower extremity edema; Benign essential hypertension; Back muscle spasm; Anxiety disorder; Anemia; ADD (attention deficit disorder); Syncope; Sessile colonic polyp; Right-sided low back pain with sciatica; Refused influenza vaccine; RAD (reactive airway disease), moderate persistent, uncomplicated; Other chronic pain; Migraine; Major depressive disorder; Insomnia; IDA (iron deficiency anemia); Chronic low back pain (Primary Area of Pain) (Bilateral) (R>L) w/ sciatica (Right); Chronic lower extremity pain (Secondary Area of Pain) (Right); Chronic sacroiliac joint pain (Bilateral) (L>R); Chronic pain syndrome; Long term current use of opiate  analgesic; Pharmacologic therapy; Disorder of skeletal system; Problems influencing health status; Abnormal MRI, lumbar spine (07/16/2016); DDD (degenerative disc disease), lumbar; DDD (degenerative disc disease), thoracic; Lumbar facet arthropathy (Bilateral); Lumbar facet syndrome (Bilateral); Long term current use of anticoagulant therapy (Plavix); Neurogenic pain; and Chronic musculoskeletal pain on their problem list. Her primarily concern today is the Back Pain (lower, right is worse )  Pain Assessment: Location: Lower, Left, Right Back Radiating: into right hip down the back of the thigh to approx the knee  Onset: More than a month ago Duration: Chronic pain Quality: Discomfort, Spasm, Burning, Constant(feels like hot knife running down the back of leg. ) Severity: 3 /10 (subjective, self-reported pain score)  Note: Reported level is compatible with observation.                         When using our objective Pain Scale, levels between 6 and 10/10 are said to belong in an emergency room, as it progressively worsens from a 6/10, described as severely limiting, requiring emergency care not usually available at an outpatient pain management facility. At a 6/10 level, communication becomes difficult and requires great effort. Assistance to reach the emergency department may be required. Facial flushing and profuse sweating along with potentially dangerous increases in heart rate and blood pressure will be evident. Effect on ADL: standing still or sitting aggravate the pain.  better if she keeps moving.  Timing: Constant Modifying factors: just finished 5 day course of steroids.  BP: 124/69  HR: 67  [name] comes in today for a follow-up visit after her initial evaluation on 12/03/2017. Today we went over the results of her tests. These were explained in "Layman's terms". During today's appointment we went over my diagnostic impression, as well as the proposed treatment plan.  According to the  patient her  primary area of pain is in her lower back (B) (R>L).  She denies any precipitating factors.  She admits that the right side is greater than the left.  She denies any previous surgery.  She denies any interventional therapy.  She has had physical therapy on last year which was effective and she continues to do home exercise which she also gets benefit from.  She has had an MRI 2018.  Her second area of pain is in her leg (R).  She admits that is only in the right.  Goes down the back of the leg to the level of the knee. The pain goes down the back of her leg to the knee. She admits that it feels like hot shocking type pain. She denies normal weakness but admits that when her back is worse she has difficulty lifting her right leg. She sometimes even has to get assistance to get out of her car when her pain is at its worse.  Had an MI on July 15th, 2019.  She was placed on Plavix anticoagulation and informed that she cannot come off of it until 09/16/2018.  Once she is able to stop the Plavix for 7 to 10 days, then we will commence interventional treatments to decrease her chronic pain.  Meanwhile, we will be managing this patient with oxycodone IR 5 mg every 6 hours.  The patient will be taken off of the Percocet in order to eliminate the APAP.  Currently her liver enzymes are elevated.  In considering the treatment plan options, Ms. Kerwin was reminded that I no longer take patients for medication management only. I asked her to let me know if she had no intention of taking advantage of the interventional therapies, so that we could make arrangements to provide this space to someone interested. I also made it clear that undergoing interventional therapies for the purpose of getting pain medications is very inappropriate on the part of a patient, and it will not be tolerated in this practice. This type of behavior would suggest true addiction and therefore it requires referral to an addiction  specialist.   Further details on both, my assessment(s), as well as the proposed treatment plan, please see below.  Controlled Substance Pharmacotherapy Assessment REMS (Risk Evaluation and Mitigation Strategy)  Analgesic: Oxycodone IR 5 mg every 6 hours (20 mg/day of oxycodone) Highest recorded MME/day: 31.56m/day MME/day: 30 mg/day. Pill Count: None expected due to no prior prescriptions written by our practice. No notes on file Pharmacokinetics: Liberation and absorption (onset of action): WNL Distribution (time to peak effect): WNL Metabolism and excretion (duration of action): WNL         Pharmacodynamics: Desired effects: Analgesia: Ms. CSerdareports >50% benefit. Functional ability: Patient reports that medication allows her to accomplish basic ADLs Clinically meaningful improvement in function (CMIF): Sustained CMIF goals met Perceived effectiveness: Described as relatively effective, allowing for increase in activities of daily living (ADL) Undesirable effects: Side-effects or Adverse reactions: None reported Monitoring: [place] PMP: Online review of the past 167-montheriod previously conducted. Not applicable at this point since we have not taken over the patient's medication management yet. List of other Serum/Urine Drug Screening Test(s):  No results found. List of all UDS test(s) done:  Lab Results  Component Value Date   SUMMARY FINAL 12/03/2017   Last UDS on record: Summary  Date Value Ref Range Status  12/03/2017 FINAL  Final    Comment:    ==================================================================== TOXASSURE COMP DRUG ANALYSIS,UR ====================================================================  Test                             Result       Flag       Units Drug Present and Declared for Prescription Verification   Oxycodone                      1649         EXPECTED   ng/mg creat   Oxymorphone                    1737         EXPECTED   ng/mg creat    Noroxycodone                   629          EXPECTED   ng/mg creat   Noroxymorphone                 220          EXPECTED   ng/mg creat    Sources of oxycodone are scheduled prescription medications.    Oxymorphone, noroxycodone, and noroxymorphone are expected    metabolites of oxycodone. Oxymorphone is also available as a    scheduled prescription medication.   Gabapentin                     PRESENT      EXPECTED   Trazodone                      PRESENT      EXPECTED   Promethazine                   PRESENT      EXPECTED   Metoprolol                     PRESENT      EXPECTED Drug Present not Declared for Prescription Verification   Acetaminophen                  PRESENT      UNEXPECTED Drug Absent but Declared for Prescription Verification   Clonazepam                     Not Detected UNEXPECTED ng/mg creat   Baclofen                       Not Detected UNEXPECTED   Duloxetine                     Not Detected UNEXPECTED   Salicylate                     Not Detected UNEXPECTED    Aspirin, as indicated in the declared medication list, is not    always detected even when used as directed. ==================================================================== Test                      Result    Flag   Units      Ref Range   Creatinine              252              mg/dL      >=20 ====================================================================  Declared Medications:  The flagging and interpretation on this report are based on the  following declared medications.  Unexpected results may arise from  inaccuracies in the declared medications.  **Note: The testing scope of this panel includes these medications:  Baclofen (Lioresal)  Clonazepam (Klonopin)  Duloxetine (Cymbalta)  Gabapentin  Metoprolol  Oxycodone  Promethazine  Trazodone  **Note: The testing scope of this panel does not include small to  moderate amounts of these reported medications:  Aspirin  **Note: The testing scope  of this panel does not include following  reported medications:  Atorvastatin (Lipitor)  Clopidogrel (Plavix)  Fluticasone (Flonase)  Hydrochlorothiazide  Levonorgestrel  Nitroglycerin  Nystatin  Ondansetron  Pantoprazole  Torsemide  Vitamin D ==================================================================== For clinical consultation, please call [phone]. ====================================================================    UDS interpretation: Unexpected findings not considered significantly abnormal.          Medication Assessment Form: Patient introduced to form today Treatment compliance: Treatment may start today if patient agrees with proposed plan. Evaluation of compliance is not applicable at this point Risk Assessment Profile: Aberrant behavior: See initial evaluations. None observed or detected today Comorbid factors increasing risk of overdose: See initial evaluation. No additional risks detected today Opioid risk tool (ORT) (Total Score): 5 Personal History of Substance Abuse (SUD-Substance use disorder):  Alcohol: Negative  Illegal Drugs: Negative  Rx Drugs: Negative  ORT Risk Level calculation: Moderate Risk Risk of substance use disorder (SUD): Moderate Opioid Risk Tool - 12/29/17 1251      Family History of Substance Abuse   Alcohol  Negative    Illegal Drugs  Positive Female    Rx Drugs  Negative      Personal History of Substance Abuse   Alcohol  Negative    Illegal Drugs  Negative    Rx Drugs  Negative      Psychological Disease   Psychological Disease  Positive    ADD  Negative    OCD  Negative    Bipolar  Negative    Schizophrenia  Negative    Depression  Positive   taking cymbalta     Total Score   Opioid Risk Tool Scoring  5    Opioid Risk Interpretation  Moderate Risk      ORT Scoring interpretation table:  Score <3 = Low Risk for SUD  Score between 4-7 = Moderate Risk for SUD  Score >8 = High Risk for Opioid Abuse    Risk Mitigation Strategies:  Patient opioid safety counseling: Completed today. Counseling provided to patient as per "Patient Counseling Document". Document signed by patient, attesting to counseling and understanding Patient-Prescriber Agreement (PPA): Obtained today.  Controlled substance notification to other providers: Written and sent today.  Pharmacologic Plan: Today we may be taking over the patient's pharmacological regimen. See below.             Laboratory Chemistry  Inflammation Markers (CRP: Acute Phase) (ESR: Chronic Phase) Lab Results  Component Value Date   CRP <0.8 12/03/2017   ESRSEDRATE 10 12/03/2017                         Rheumatology Markers No results found.  Renal Function Markers Lab Results  Component Value Date   BUN 9 09/27/2017   CREATININE 0.61 09/27/2017   GFRAA >60 09/27/2017   GFRNONAA >60 09/27/2017  Hepatic Function Markers Lab Results  Component Value Date   AST 87 (H) 10/15/2017   ALT 95 (H) 10/15/2017   ALBUMIN 1.9 (L) 09/26/2017   ALKPHOS 181 (H) 09/26/2017                        Electrolytes Lab Results  Component Value Date   NA 140 09/27/2017   K 3.5 09/27/2017   CL 112 (H) 09/27/2017   CALCIUM 7.7 (L) 09/27/2017   MG 1.9 12/03/2017                        Neuropathy Markers Lab Results  Component Value Date   VITAMINB12 318 02/14/2011   FOLATE 5.7 06/03/2012   HGBA1C 5.6 09/16/2017                        CNS Tests No results found.  Bone Pathology Markers Lab Results  Component Value Date   VD25OH  07/20/2008    37 (NOTE) This assay accurately quantifies Vitamin D, which is the sum of the 25-Hydroxy forms of Vitamin D2 and D3.  Studies have shown that the optimum concentration of 25-Hydroxy Vitamin D is 30 ng/mL or higher.  Concentrations of Vitamin D between 20  and 29 ng/mL are considered to be insufficient and concentrations less than 20 ng/mL are considered to be deficient for  Vitamin D.   [id number] 47 04/25/2009   [id number] 47 04/25/2009   [id number]  04/25/2009    <8 (NOTE) Vitamin D3, 1,25(OH)2 indicates both endogenous production and supplementation.  Vitamin D2, 1,25(OH)2 is an indicator of exogeous sources, such as diet or supplementation.  Interpretation and therapy are based on measurement of Vitamin  D,1,25(OH)2, Total. This test was developed and its performance characteristics have been determined by [facility], [place], [place]. Performance characteristics refer to the analytical performance of the test.                         Coagulation Parameters Lab Results  Component Value Date   INR 1.14 09/25/2017   LABPROT 14.5 09/25/2017   PLT 597 (H) 10/15/2017                        Cardiovascular Markers Lab Results  Component Value Date   TROPONINI 0.75 (HH) 09/16/2017   HGB 11.5 (L) 10/15/2017   HCT 36.5 10/15/2017                         CA Markers No results found.  Note: Lab results reviewed.  Recent Diagnostic Imaging Review  Sacroiliac Joint Imaging: Sacroiliac Joint DG:  Results for orders placed during the hospital encounter of 12/03/17  DG Si Joints   Narrative CLINICAL DATA:  Chronic sacroiliac pain. Pain radiates to the right leg.  EXAM: BILATERAL SACROILIAC JOINTS - 3+ VIEW  COMPARISON:  CT 09/25/2017  FINDINGS: There is mild osteoarthritis of both sacroiliac joints, more clearly depicted by the recent CT examination. No advanced arthropathy. No ankylosis. No erosions. IUD projects over the region. Symphysis pubis and hips as seen appear normal.  IMPRESSION: Mild to at most moderate osteoarthritis of both sacroiliac joints, symmetric. See above discussion.   Electronically Signed   By: Nelson Chimes M.D.   On: 12/03/2017 10:41    Complexity Note: Imaging results reviewed. Results  shared with Ms. Guyett, using Layman's terms.                         Meds   Current Outpatient  Medications:  .  aspirin 81 MG chewable tablet, Chew 1 tablet (81 mg total) by mouth daily., Disp: , Rfl:  .  atorvastatin (LIPITOR) 40 MG tablet, TAKE 1 TABLET BY MOUTH ONCE DAILY AT 6PM., Disp: 30 tablet, Rfl: 6 .  baclofen (LIORESAL) 20 MG tablet, Take 1 tablet (20 mg total) by mouth daily., Disp: 30 tablet, Rfl: 2 .  cholecalciferol (VITAMIN D) 1000 units tablet, Take 5,000 Units by mouth daily., Disp: , Rfl:  .  clonazePAM (KLONOPIN) 0.5 MG tablet, Take 1 tablet (0.5 mg total) by mouth 2 (two) times daily as needed for anxiety. ONLY FOR SEVERE PANIC ATTACKS, Disp: 60 tablet, Rfl: 1 .  clopidogrel (PLAVIX) 75 MG tablet, Take 1 tablet (75 mg total) by mouth daily., Disp: 90 tablet, Rfl: 3 .  Cyanocobalamin (VITAMIN B-12 IJ), Inject as directed every 30 (thirty) days. , Disp: , Rfl:  .  DULoxetine (CYMBALTA) 30 MG capsule, Take 1 capsule (30 mg total) by mouth 2 (two) times daily., Disp: 60 capsule, Rfl: 1 .  fluticasone (FLONASE) 50 MCG/ACT nasal spray, Place into the nose., Disp: , Rfl:  .  gabapentin (NEURONTIN) 300 MG capsule, Take 3 capsules (900 mg total) by mouth at bedtime., Disp: 90 capsule, Rfl: 2 .  hydrochlorothiazide (HYDRODIURIL) 50 MG tablet, Take 50 mg by mouth as needed., Disp: , Rfl:  .  lactobacillus acidophilus (BACID) TABS tablet, Take 1 tablet by mouth daily., Disp: , Rfl:  .  levonorgestrel (MIRENA) 20 MCG/24HR IUD, by Intrauterine route., Disp: , Rfl:  .  metoprolol tartrate (LOPRESSOR) 25 MG tablet, Take 0.5 tablets (12.5 mg total) by mouth 2 (two) times daily., Disp: 90 tablet, Rfl: 3 .  nitroGLYCERIN (NITROSTAT) 0.4 MG SL tablet, Place 1 tablet (0.4 mg total) under the tongue every 5 (five) minutes as needed for chest pain., Disp: 25 tablet, Rfl: 2 .  nystatin (MYCOSTATIN/NYSTOP) powder, Apply topically., Disp: , Rfl:  .  ondansetron (ZOFRAN-ODT) 8 MG disintegrating tablet, Take 1 tablet by mouth 3 (three) times daily as needed for nausea. , Disp: , Rfl: 3 .   pantoprazole (PROTONIX) 40 MG tablet, TAKE 1 TABLET BY MOUTH ONCE DAILY BEFORE BREAKFAST., Disp: 30 tablet, Rfl: 6 .  promethazine (PHENERGAN) 25 MG tablet, Take 25 mg by mouth 3 (three) times daily as needed for nausea. , Disp: , Rfl: 2 .  ranitidine (ZANTAC) 150 MG tablet, Take 150 mg by mouth daily., Disp: , Rfl:  .  torsemide (DEMADEX) 20 MG tablet, , Disp: , Rfl: 2 .  traZODone (DESYREL) 100 MG tablet, Take 1-1.5 tablets (100-150 mg total) by mouth at bedtime as needed for sleep., Disp: 45 tablet, Rfl: 1 .  oxyCODONE (OXY IR/ROXICODONE) 5 MG immediate release tablet, Take 1 tablet (5 mg total) by mouth every 6 (six) hours as needed for severe pain. Must last 30 days., Disp: 120 tablet, Rfl: 0  ROS  Constitutional: Denies any fever or chills Gastrointestinal: No reported hemesis, hematochezia, vomiting, or acute GI distress Musculoskeletal: Denies any acute onset joint swelling, redness, loss of ROM, or weakness Neurological: No reported episodes of acute onset apraxia, aphasia, dysarthria, agnosia, amnesia, paralysis, loss of coordination, or loss of consciousness  Allergies  Ms. Gorczyca is allergic to bee venom; erythromycin; cephalexin; neosporin original [bacitracin-neomycin-polymyxin];  prednisone; sulfa antibiotics; tobrex [tobramycin]; tramadol; and penicillins.  PFSH  Drug: Ms. Lodwick  reports that she does not use drugs. Alcohol:  reports that she does not drink alcohol. Tobacco:  reports that she quit smoking about 8 months ago. Her smoking use included cigarettes. She has a 3.00 pack-year smoking history. She has never used smokeless tobacco. Medical:  has a past medical history of ADHD (attention deficit hyperactivity disorder), Anxiety, B12 deficiency (02/07/2011), Back pain, CAD (coronary artery disease), Celiac disease, Folic acid deficiency (02/07/2011), Headache, Iron deficiency anemia (02/07/2011), and Myocardial infarction ([place]) (09/15/2017). Surgical: Ms. Manton  has a past surgical  history that includes Tonsillectomy; Cholecystectomy; Knee cartilage surgery (Right); LEFT HEART CATH AND CORONARY ANGIOGRAPHY (N/A, 09/15/2017); CORONARY STENT INTERVENTION (Right, 09/15/2017); and Colonoscopy with propofol (N/A, 09/26/2017). Family: family history includes Anxiety disorder in her sister; Bipolar disorder in her son; CAD in her maternal grandfather and paternal grandmother; Cancer in her mother and sister; Cirrhosis in her paternal grandfather; Depression in her sister; Diabetes in her paternal grandmother; Drug abuse in her son; Hypertension in her father and mother; Hypothyroidism in her father and paternal grandmother.  Constitutional Exam  General appearance: Well nourished, well developed, and well hydrated. In no apparent acute distress Vitals:   12/29/17 1245  BP: 124/69  Pulse: 67  Resp: 16  Temp: 97.8 F (36.6 C)  TempSrc: Oral  SpO2: 100%  Weight: 196 lb (88.9 kg)  Height: 5' (1.524 m)   BMI Assessment: Estimated body mass index is 38.28 kg/m as calculated from the following:   Height as of this encounter: 5' (1.524 m).   Weight as of this encounter: 196 lb (88.9 kg).  BMI interpretation table: BMI level Category Range association with higher incidence of chronic pain  <18 kg/m2 Underweight   18.5-24.9 kg/m2 Ideal body weight   25-29.9 kg/m2 Overweight Increased incidence by 20%  30-34.9 kg/m2 Obese (Class I) Increased incidence by 68%  35-39.9 kg/m2 Severe obesity (Class II) Increased incidence by 136%  >40 kg/m2 Extreme obesity (Class III) Increased incidence by 254%   Patient's current BMI Ideal Body weight  Body mass index is 38.28 kg/m. Ideal body weight: 45.5 kg (100 lb 4.9 oz) Adjusted ideal body weight: 62.9 kg (138 lb 9.4 oz)   BMI Readings from Last 4 Encounters:  12/29/17 38.28 kg/m  12/03/17 36.58 kg/m  10/15/17 41.05 kg/m  10/10/17 40.62 kg/m   Wt Readings from Last 4 Encounters:  12/29/17 196 lb (88.9 kg)  12/03/17 200 lb (90.7  kg)  10/15/17 210 lb 3.2 oz (95.3 kg)  10/10/17 208 lb (94.3 kg)  Psych/Mental status: Alert, oriented x 3 (person, place, & time)       Eyes: PERLA Respiratory: No evidence of acute respiratory distress  Cervical Spine Area Exam  Skin & Axial Inspection: No masses, redness, edema, swelling, or associated skin lesions Alignment: Symmetrical Functional ROM: Unrestricted ROM      Stability: No instability detected Muscle Tone/Strength: Functionally intact. No obvious neuro-muscular anomalies detected. Sensory (Neurological): Unimpaired Palpation: No palpable anomalies              Upper Extremity (UE) Exam    Side: Right upper extremity  Side: Left upper extremity  Skin & Extremity Inspection: Skin color, temperature, and hair growth are WNL. No peripheral edema or cyanosis. No masses, redness, swelling, asymmetry, or associated skin lesions. No contractures.  Skin & Extremity Inspection: Skin color, temperature, and hair growth are WNL. No peripheral edema or cyanosis.  No masses, redness, swelling, asymmetry, or associated skin lesions. No contractures.  Functional ROM: Unrestricted ROM          Functional ROM: Unrestricted ROM          Muscle Tone/Strength: Functionally intact. No obvious neuro-muscular anomalies detected.  Muscle Tone/Strength: Functionally intact. No obvious neuro-muscular anomalies detected.  Sensory (Neurological): Unimpaired          Sensory (Neurological): Unimpaired          Palpation: No palpable anomalies              Palpation: No palpable anomalies              Provocative Test(s):  Phalen's test: deferred Tinel's test: deferred Apley's scratch test (touch opposite shoulder):  Action 1 (Across chest): deferred Action 2 (Overhead): deferred Action 3 (LB reach): deferred   Provocative Test(s):  Phalen's test: deferred Tinel's test: deferred Apley's scratch test (touch opposite shoulder):  Action 1 (Across chest): deferred Action 2 (Overhead):  deferred Action 3 (LB reach): deferred    Thoracic Spine Area Exam  Skin & Axial Inspection: No masses, redness, or swelling Alignment: Symmetrical Functional ROM: Unrestricted ROM Stability: No instability detected Muscle Tone/Strength: Functionally intact. No obvious neuro-muscular anomalies detected. Sensory (Neurological): Unimpaired Muscle strength & Tone: No palpable anomalies  Lumbar Spine Area Exam  Skin & Axial Inspection: No masses, redness, or swelling Alignment: Symmetrical Functional ROM: Decreased ROM       Stability: No instability detected Muscle Tone/Strength: Increased muscle tone over affected area Sensory (Neurological): Movement-associated pain Palpation: Complains of area being tender to palpation       Provocative Tests: Hyperextension/rotation test: (+) bilaterally for facet joint pain. Lumbar quadrant test (Kemp's test): (+) bilaterally for facet joint pain. Lateral bending test: deferred today       Patrick's Maneuver: (+) for bilateral S-I arthralgia             FABER test: (+) for bilateral S-I arthralgia             S-I anterior distraction/compression test: deferred today         S-I lateral compression test: deferred today         S-I Thigh-thrust test: deferred today         S-I Gaenslen's test: deferred today          Gait & Posture Assessment  Ambulation: Unassisted Gait: Relatively normal for age and body habitus Posture: WNL   Lower Extremity Exam    Side: Right lower extremity  Side: Left lower extremity  Stability: No instability observed          Stability: No instability observed          Skin & Extremity Inspection: Skin color, temperature, and hair growth are WNL. No peripheral edema or cyanosis. No masses, redness, swelling, asymmetry, or associated skin lesions. No contractures.  Skin & Extremity Inspection: Skin color, temperature, and hair growth are WNL. No peripheral edema or cyanosis. No masses, redness, swelling, asymmetry, or  associated skin lesions. No contractures.  Functional ROM: Unrestricted ROM                  Functional ROM: Unrestricted ROM                  Muscle Tone/Strength: Functionally intact. No obvious neuro-muscular anomalies detected.  Muscle Tone/Strength: Functionally intact. No obvious neuro-muscular anomalies detected.  Sensory (Neurological): Unimpaired  Sensory (Neurological): Unimpaired  Palpation: No palpable  anomalies  Palpation: No palpable anomalies   Assessment & Plan  Primary Diagnosis & Pertinent Problem List: The primary encounter diagnosis was Chronic pain syndrome. Diagnoses of Chronic low back pain (Primary Area of Pain) (Bilateral) (R>L) w/ sciatica (Right), Chronic lower extremity pain (Secondary Area of Pain) (Right), DDD (degenerative disc disease), lumbar, Lumbar facet arthropathy (Bilateral), Lumbar facet syndrome (Bilateral), Chronic sacroiliac joint pain (Bilateral) (L>R), Elevated LFTs, Long term current use of anticoagulant therapy (Plavix), Back muscle spasm, Neurogenic pain, and Chronic musculoskeletal pain were also pertinent to this visit.  Visit Diagnosis: 1. Chronic pain syndrome   2. Chronic low back pain (Primary Area of Pain) (Bilateral) (R>L) w/ sciatica (Right)   3. Chronic lower extremity pain (Secondary Area of Pain) (Right)   4. DDD (degenerative disc disease), lumbar   5. Lumbar facet arthropathy (Bilateral)   6. Lumbar facet syndrome (Bilateral)   7. Chronic sacroiliac joint pain (Bilateral) (L>R)   8. Elevated LFTs   9. Long term current use of anticoagulant therapy (Plavix)   10. Back muscle spasm   11. Neurogenic pain   12. Chronic musculoskeletal pain    Problems updated and reviewed during this visit: Problem  Lumbar facet arthropathy (Bilateral)   L5-S1: Moderate facet arthrosis bilaterally with trace facet effusions and small amount of reactive edema associated with the left-sided facet joint. 5 mm synovial cyst posteriorly at the left  facet.     Plan of Care  Pharmacotherapy (Medications Ordered): Meds ordered this encounter  Medications  . baclofen (LIORESAL) 20 MG tablet    Sig: Take 1 tablet (20 mg total) by mouth daily.    Dispense:  30 tablet    Refill:  2    Do not place medication on "Automatic Refill". Fill one day early if pharmacy is closed on scheduled refill date.  . gabapentin (NEURONTIN) 300 MG capsule    Sig: Take 3 capsules (900 mg total) by mouth at bedtime.    Dispense:  90 capsule    Refill:  2    Do not place medication on "Automatic Refill". Fill one day early if pharmacy is closed on scheduled refill date.  Marland Kitchen oxyCODONE (OXY IR/ROXICODONE) 5 MG immediate release tablet    Sig: Take 1 tablet (5 mg total) by mouth every 6 (six) hours as needed for severe pain. Must last 30 days.    Dispense:  120 tablet    Refill:  0    [place] STOP ACT - Not applicable. Fill one day early if pharmacy is closed on scheduled refill date. Must last 30 days.   Procedure Orders    No procedure(s) ordered today   Lab Orders  No laboratory test(s) ordered today   Imaging Orders  No imaging studies ordered today   Referral Orders  No referral(s) requested today    Pharmacological management options:  Opioid Analgesics: We'll take over management today. See above orders Membrane stabilizer: We have discussed the possibility of optimizing this mode of therapy, if tolerated Muscle relaxant: We have discussed the possibility of a trial NSAID: We have discussed the possibility of a trial Other analgesic(s): To be determined at a later time   Interventional management options: Planned, scheduled, and/or pending:    No blocks until after July 15th, 2020 due to MI and blood thinners.   Considering:   Diagnostic bilateral lumbar facet nerve block  Possible bilateral lumbar facet radiofrequency ablation  Diagnostic sacroiliac joint nerve block  Possible sacroiliac joint radiofrequency ablation  PRN Procedures:    None at this time   Provider-requested follow-up: Return for Med-Mgmt, w/ Dionisio David, NP.  Future Appointments  Date Time Provider [place]  12/31/2017 12:30 PM Alden Hipp, LCSW [facility] None  01/16/2018 10:45 AM Ursula Alert, MD [facility] None  01/16/2018  2:00 PM Arnoldo Lenis, MD [facility]  01/26/2018  2:30 PM Vevelyn Francois, NP [facility] None  10/19/2018  9:30 AM Vaughan Basta, Rona Ravens, NP [facility] None   Primary Care Physician: Vidal Schwalbe, MD Location: [facility] Note by: Gaspar Cola, MD Date: 12/29/2017; Time: 1:51 PM

## 2017-12-26 ENCOUNTER — Ambulatory Visit: Payer: 59 | Admitting: Licensed Clinical Social Worker

## 2017-12-29 ENCOUNTER — Ambulatory Visit: Payer: 59 | Attending: Pain Medicine | Admitting: Pain Medicine

## 2017-12-29 ENCOUNTER — Encounter: Payer: Self-pay | Admitting: Pain Medicine

## 2017-12-29 VITALS — BP 124/69 | HR 67 | Temp 97.8°F | Resp 16 | Ht 60.0 in | Wt 196.0 lb

## 2017-12-29 DIAGNOSIS — G894 Chronic pain syndrome: Secondary | ICD-10-CM | POA: Diagnosis not present

## 2017-12-29 DIAGNOSIS — M5441 Lumbago with sciatica, right side: Secondary | ICD-10-CM | POA: Diagnosis not present

## 2017-12-29 DIAGNOSIS — M51369 Other intervertebral disc degeneration, lumbar region without mention of lumbar back pain or lower extremity pain: Secondary | ICD-10-CM

## 2017-12-29 DIAGNOSIS — Z7901 Long term (current) use of anticoagulants: Secondary | ICD-10-CM

## 2017-12-29 DIAGNOSIS — M5136 Other intervertebral disc degeneration, lumbar region: Secondary | ICD-10-CM

## 2017-12-29 DIAGNOSIS — M47816 Spondylosis without myelopathy or radiculopathy, lumbar region: Secondary | ICD-10-CM

## 2017-12-29 DIAGNOSIS — M7918 Myalgia, other site: Secondary | ICD-10-CM | POA: Insufficient documentation

## 2017-12-29 DIAGNOSIS — R7989 Other specified abnormal findings of blood chemistry: Secondary | ICD-10-CM

## 2017-12-29 DIAGNOSIS — M62830 Muscle spasm of back: Secondary | ICD-10-CM

## 2017-12-29 DIAGNOSIS — M79604 Pain in right leg: Secondary | ICD-10-CM | POA: Diagnosis not present

## 2017-12-29 DIAGNOSIS — M533 Sacrococcygeal disorders, not elsewhere classified: Secondary | ICD-10-CM

## 2017-12-29 DIAGNOSIS — G8929 Other chronic pain: Secondary | ICD-10-CM

## 2017-12-29 DIAGNOSIS — R937 Abnormal findings on diagnostic imaging of other parts of musculoskeletal system: Secondary | ICD-10-CM | POA: Insufficient documentation

## 2017-12-29 DIAGNOSIS — R945 Abnormal results of liver function studies: Secondary | ICD-10-CM

## 2017-12-29 DIAGNOSIS — M5134 Other intervertebral disc degeneration, thoracic region: Secondary | ICD-10-CM | POA: Insufficient documentation

## 2017-12-29 DIAGNOSIS — M792 Neuralgia and neuritis, unspecified: Secondary | ICD-10-CM

## 2017-12-29 MED ORDER — BACLOFEN 20 MG PO TABS: 20.0000 mg | ORAL_TABLET | Freq: Every day | ORAL | 2 refills | Status: DC

## 2017-12-29 MED ORDER — OXYCODONE HCL 5 MG PO TABS: 5.0000 mg | ORAL_TABLET | Freq: Four times a day (QID) | ORAL | 0 refills | Status: DC | PRN

## 2017-12-29 MED ORDER — GABAPENTIN 300 MG PO CAPS: 900.0000 mg | ORAL_CAPSULE | Freq: Every day | ORAL | 2 refills | Status: DC

## 2017-12-29 NOTE — Patient Instructions (Addendum)
_____You have been prescribed oxycodone, baclofen and gabapentin to your pharmacy. _______________________________________________________________________________________  Medication Rules  Applies to: All patients receiving prescriptions (written or electronic).  Pharmacy of record: Pharmacy where electronic prescriptions will be sent. If written prescriptions are taken to a different pharmacy, please inform the nursing staff. The pharmacy listed in the electronic medical record should be the one where you would like electronic prescriptions to be sent.  Prescription refills: Only during scheduled appointments. Applies to both, written and electronic prescriptions.  NOTE: The following applies primarily to controlled substances (Opioid* Pain Medications).   Patient's responsibilities: 1. Pain Pills: Bring all pain pills to every appointment (except for procedure appointments). 2. Pill Bottles: Bring pills in original pharmacy bottle. Always bring newest bottle. Bring bottle, even if empty. 3. Medication refills: You are responsible for knowing and keeping track of what medications you need refilled. The day before your appointment, write a list of all prescriptions that need to be refilled. Bring that list to your appointment and give it to the admitting nurse. Prescriptions will be written only during appointments. If you forget a medication, it will not be "Called in", "Faxed", or "electronically sent". You will need to get another appointment to get these prescribed. 4. Prescription Accuracy: You are responsible for carefully inspecting your prescriptions before leaving our office. Have the discharge nurse carefully go over each prescription with you, before taking them home. Make sure that your name is accurately spelled, that your address is correct. Check the name and dose of your medication to make sure it is accurate. Check the number of pills, and the written instructions to make sure they  are clear and accurate. Make sure that you are given enough medication to last until your next medication refill appointment. 5. Taking Medication: Take medication as prescribed. Never take more pills than instructed. Never take medication more frequently than prescribed. Taking less pills or less frequently is permitted and encouraged, when it comes to controlled substances (written prescriptions).  6. Inform other Doctors: Always inform, all of your healthcare providers, of all the medications you take. 7. Pain Medication from other Providers: You are not allowed to accept any additional pain medication from any other Doctor or Healthcare provider. There are two exceptions to this rule. (see below) In the event that you require additional pain medication, you are responsible for notifying us, as stated below. 8. Medication Agreement: You are responsible for carefully reading and following our Medication Agreement. This must be signed before receiving any prescriptions from our practice. Safely store a copy of your signed Agreement. Violations to the Agreement will result in no further prescriptions. (Additional copies of our Medication Agreement are available upon request.) 9. Laws, Rules, & Regulations: All patients are expected to follow all Federal and [employer], [employer], Rules, [employer]. Ignorance of the Laws does not constitute a valid excuse. The use of any illegal substances is prohibited. 10. Adopted CDC guidelines & recommendations: Target dosing levels will be at or below 60 MME/day. Use of benzodiazepines** is not recommended.  Exceptions: There are only two exceptions to the rule of not receiving pain medications from other Healthcare Providers. 1. Exception #1 (Emergencies): In the event of an emergency (i.e.: accident requiring emergency care), you are allowed to receive additional pain medication. However, you are responsible for: As soon as you are able, call our office [phone]  [phone], at any time of the day or night, and leave a message stating your name, the date and nature  of the emergency, and the name and dose of the medication prescribed. In the event that your call is answered by a member of our staff, make sure to document and save the date, time, and the name of the person that took your information.  2. Exception #2 (Planned Surgery): In the event that you are scheduled by another doctor or dentist to have any type of surgery or procedure, you are allowed (for a period no longer than 30 days), to receive additional pain medication, for the acute post-op pain. However, in this case, you are responsible for picking up a copy of our "Post-op Pain Management for Surgeons" handout, and giving it to your surgeon or dentist. This document is available at our office, and does not require an appointment to obtain it. Simply go to our office during business hours (Monday-Thursday from 8:00 AM to 4:00 PM) (Friday 8:00 AM to 12:00 Noon) or if you have a scheduled appointment with [place], prior to your surgery, and ask for it by name. In addition, you will need to provide [place] with your name, name of your surgeon, type of surgery, and date of procedure or surgery.  *Opioid medications include: morphine, codeine, oxycodone, oxymorphone, hydrocodone, hydromorphone, meperidine, tramadol, tapentadol, buprenorphine, fentanyl, methadone. **Benzodiazepine medications include: diazepam (Valium), alprazolam (Xanax), clonazepam (Klonopine), lorazepam (Ativan), clorazepate (Tranxene), chlordiazepoxide (Librium), estazolam (Prosom), oxazepam (Serax), temazepam (Restoril), triazolam (Halcion) (Last updated: 05/01/2017) ____________________________________________________________________________________________   ____________________________________________________________________________________________  Medication Recommendations and Reminders  Applies to: All patients receiving prescriptions  (written and/or electronic).  Medication Rules & Regulations: These rules and regulations exist for your safety and that of others. They are not flexible and neither are we. Dismissing or ignoring them will be considered "non-compliance" with medication therapy, resulting in complete and irreversible termination of such therapy. (See document titled "Medication Rules" for more details.) In all conscience, because of safety reasons, we cannot continue providing a therapy where the patient does not follow instructions.  Pharmacy of record:   Definition: This is the pharmacy where your electronic prescriptions will be sent.   We do not endorse any particular pharmacy.  You are not restricted in your choice of pharmacy.  The pharmacy listed in the electronic medical record should be the one where you want electronic prescriptions to be sent.  If you choose to change pharmacy, simply notify our nursing staff of your choice of new pharmacy.  Recommendations:  Keep all of your pain medications in a safe place, under lock and key, even if you live alone.   After you fill your prescription, take 1 week's worth of pills and put them away in a safe place. You should keep a separate, properly labeled bottle for this purpose. The remainder should be kept in the original bottle. Use this as your primary supply, until it runs out. Once it's gone, then you know that you have 1 week's worth of medicine, and it is time to come in for a prescription refill. If you do this correctly, it is unlikely that you will ever run out of medicine.  To make sure that the above recommendation works, it is very important that you make sure your medication refill appointments are scheduled at least 1 week before you run out of medicine. To do this in an effective manner, make sure that you do not leave the office without scheduling your next medication management appointment. Always ask the nursing staff to show you in your  prescription , when your medication will be running  out. Then arrange for the receptionist to get you a return appointment, at least 7 days before you run out of medicine. Do not wait until you have 1 or 2 pills left, to come in. This is very poor planning and does not take into consideration that we may need to cancel appointments due to bad weather, sickness, or emergencies affecting our staff.  "Partial Fill": If for any reason your pharmacy does not have enough pills/tablets to completely fill or refill your prescription, do not allow for a "partial fill". You will need a separate prescription to fill the remaining amount, which we will not provide. If the reason for the partial fill is your insurance, you will need to talk to the pharmacist about payment alternatives for the remaining tablets, but again, do not accept a partial fill.  Prescription refills and/or changes in medication(s):   Prescription refills, and/or changes in dose or medication, will be conducted only during scheduled medication management appointments. (Applies to both, written and electronic prescriptions.)  No refills on procedure days. No medication will be changed or started on procedure days. No changes, adjustments, and/or refills will be conducted on a procedure day. Doing so will interfere with the diagnostic portion of the procedure.  No phone refills. No medications will be "called into the pharmacy".  No Fax refills.  No weekend refills.  No Holliday refills.  No after hours refills.  Remember:  Business hours are:  Monday to Thursday 8:00 AM to 4:00 PM Provider's Schedule: Dionisio David, NP - Appointments are:  Medication management: Monday to Thursday 8:00 AM to 4:00 PM Milinda Pointer, MD - Appointments are:  Medication management: Monday and Wednesday 8:00 AM to 4:00 PM Procedure day: Tuesday and Thursday 7:30 AM to 4:00 PM Gillis Santa, MD - Appointments are:  Medication management: Tuesday and  Thursday 8:00 AM to 4:00 PM Procedure day: Monday and Wednesday 7:30 AM to 4:00 PM (Last update: 05/01/2017) ____________________________________________________________________________________________   ____________________________________________________________________________________________  CANNABIDIOL (AKA: CBD Oil or Pills)  Applies to: All patients receiving prescriptions of controlled substances (written and/or electronic).  General Information: Cannabidiol (CBD) was discovered in [birth year]. It is one of some 113 identified cannabinoids in cannabis (Marijuana) plants, accounting for up to 40% of the plant's extract. As of 2018, preliminary clinical research on cannabidiol included studies of anxiety, cognition, movement disorders, and pain.  Cannabidiol is consummed in multiple ways, including inhalation of cannabis smoke or vapor, as an aerosol spray into the cheek, and by mouth. It may be supplied as CBD oil containing CBD as the active ingredient (no added tetrahydrocannabinol (THC) or terpenes), a full-plant CBD-dominant hemp extract oil, capsules, dried cannabis, or as a liquid solution. CBD is thought not have the same psychoactivity as THC, and may affect the actions of THC. Studies suggest that CBD may interact with different biological targets, including cannabinoid receptors and other neurotransmitter receptors. As of 2018 the mechanism of action for its biological effects has not been determined.  In the [place], cannabidiol has a limited approval by the Food and Drug Administration (FDA) for treatment of only two types of epilepsy disorders. The side effects of long-term use of the drug include somnolence, decreased appetite, diarrhea, fatigue, malaise, weakness, sleeping problems, and others.  CBD remains a Schedule I drug prohibited for any use.  Legality: Some manufacturers ship CBD products nationally, an illegal action which the FDA has not enforced in 2018, with CBD  remaining the subject of an FDA investigational new drug  evaluation, and is not considered legal as a dietary supplement or food ingredient as of December 2018. Federal illegality has made it difficult historically to conduct research on CBD. CBD is openly sold in head shops and health food stores in some states where such sales have not been explicitly legalized.  Warning: Because it is not FDA approved for general use or treatment of pain, it is not required to undergo the same manufacturing controls as prescription drugs.  This means that the available cannabidiol (CBD) may be contaminated with THC.  If this is the case, it will trigger a positive urine drug screen (UDS) test for cannabinoids (Marijuana).  Because a positive UDS for illicit substances is a violation of our medication agreement, your opioid analgesics (pain medicine) may be permanently discontinued. (Last update: 05/22/2017) ____________________________________________________________________________________________

## 2017-12-31 ENCOUNTER — Ambulatory Visit: Payer: 59 | Admitting: Licensed Clinical Social Worker

## 2017-12-31 ENCOUNTER — Encounter: Payer: Self-pay | Admitting: Licensed Clinical Social Worker

## 2017-12-31 ENCOUNTER — Encounter (HOSPITAL_COMMUNITY): Payer: Self-pay | Admitting: Emergency Medicine

## 2017-12-31 ENCOUNTER — Other Ambulatory Visit: Payer: Self-pay

## 2017-12-31 ENCOUNTER — Emergency Department (HOSPITAL_COMMUNITY): Payer: 59

## 2017-12-31 ENCOUNTER — Emergency Department (HOSPITAL_COMMUNITY)
Admission: EM | Admit: 2017-12-31 | Discharge: 2017-12-31 | Disposition: A | Discharge status: Home / Self Care | Payer: 59 | Attending: Emergency Medicine | Admitting: Emergency Medicine

## 2017-12-31 DIAGNOSIS — Z79899 Other long term (current) drug therapy: Secondary | ICD-10-CM | POA: Insufficient documentation

## 2017-12-31 DIAGNOSIS — E039 Hypothyroidism, unspecified: Secondary | ICD-10-CM | POA: Insufficient documentation

## 2017-12-31 DIAGNOSIS — R0789 Other chest pain: Secondary | ICD-10-CM | POA: Insufficient documentation

## 2017-12-31 DIAGNOSIS — F410 Panic disorder [episodic paroxysmal anxiety] without agoraphobia: Secondary | ICD-10-CM | POA: Diagnosis not present

## 2017-12-31 DIAGNOSIS — I252 Old myocardial infarction: Secondary | ICD-10-CM | POA: Diagnosis not present

## 2017-12-31 DIAGNOSIS — Z7982 Long term (current) use of aspirin: Secondary | ICD-10-CM | POA: Diagnosis not present

## 2017-12-31 DIAGNOSIS — I2510 Atherosclerotic heart disease of native coronary artery without angina pectoris: Secondary | ICD-10-CM | POA: Insufficient documentation

## 2017-12-31 DIAGNOSIS — Z7902 Long term (current) use of antithrombotics/antiplatelets: Secondary | ICD-10-CM | POA: Insufficient documentation

## 2017-12-31 DIAGNOSIS — Z87891 Personal history of nicotine dependence: Secondary | ICD-10-CM | POA: Diagnosis not present

## 2017-12-31 DIAGNOSIS — R079 Chest pain, unspecified: Secondary | ICD-10-CM

## 2017-12-31 LAB — COMPREHENSIVE METABOLIC PANEL
ALT: 50 U/L — ABNORMAL HIGH (ref 0–44)
AST: 29 U/L (ref 15–41)
Albumin: 3.4 g/dL — ABNORMAL LOW (ref 3.5–5.0)
Alkaline Phosphatase: 97 U/L (ref 38–126)
Anion gap: 7 (ref 5–15)
BUN: 12 mg/dL (ref 6–20)
CO2: 29 mmol/L (ref 22–32)
Calcium: 8.4 mg/dL — ABNORMAL LOW (ref 8.9–10.3)
Chloride: 106 mmol/L (ref 98–111)
Creatinine, Ser: 0.76 mg/dL (ref 0.44–1.00)
GFR calc Af Amer: >60 mL/min (ref >60)
GFR calc non Af Amer: >60 mL/min (ref >60)
Glucose, Bld: 130 mg/dL — ABNORMAL HIGH (ref 70–99)
Potassium: 3.5 mmol/L (ref 3.5–5.1)
Sodium: 142 mmol/L (ref 135–145)
Total Bilirubin: 0.6 mg/dL (ref 0.3–1.2)
Total Protein: 6.3 g/dL — ABNORMAL LOW (ref 6.5–8.1)

## 2017-12-31 LAB — CBC WITH DIFFERENTIAL/PLATELET
Abs Immature Granulocytes: 0.01 10*3/uL (ref 0.00–0.07)
Basophils Absolute: 0 10*3/uL (ref 0.0–0.1)
Basophils Relative: 0 %
Eosinophils Absolute: 0 10*3/uL (ref 0.0–0.5)
Eosinophils Relative: 0 %
HCT: 39.2 % (ref 36.0–46.0)
Hemoglobin: 11.8 g/dL — ABNORMAL LOW (ref 12.0–15.0)
Immature Granulocytes: 0 %
Lymphocytes Relative: 30 %
Lymphs Abs: 2.6 10*3/uL (ref 0.7–4.0)
MCH: 25.1 pg — ABNORMAL LOW (ref 26.0–34.0)
MCHC: 30.1 g/dL (ref 30.0–36.0)
MCV: 83.4 fL (ref 80.0–100.0)
Monocytes Absolute: 0.7 10*3/uL (ref 0.1–1.0)
Monocytes Relative: 8 %
Neutro Abs: 5.3 10*3/uL (ref 1.7–7.7)
Neutrophils Relative %: 62 %
Platelets: 534 10*3/uL — ABNORMAL HIGH (ref 150–400)
RBC: 4.7 MIL/uL (ref 3.87–5.11)
RDW: 15.8 % — ABNORMAL HIGH (ref 11.5–15.5)
WBC: 8.6 10*3/uL (ref 4.0–10.5)
nRBC: 0 % (ref 0.0–0.2)

## 2017-12-31 LAB — TROPONIN I: Troponin I: <0.03 ng/mL (ref <0.03)

## 2017-12-31 NOTE — Progress Notes (Signed)
   THERAPIST PROGRESS NOTE  Session Time: 1230  Participation Level: Active  Behavioral Response: NeatAlertAnxious  Type of Therapy: Individual Therapy  Treatment Goals addressed: Anxiety  Interventions: CBT  Summary: Sharon Lawrence is a 52 y.o. female who presents with anxiety and panic attacks. Sharon Lawrence reported she has been having up to five panic attacks a week, which is down from five panic attacks per day. We discussed seeing the positive in that decrease, but how that is still a high number of panic attacks. She reported her son is currently out of jail and living at her home awaiting his hearing at the end of November. She cited that as a primary source of anxiety, as she feels he is lying frequently. We discussed her desire and tendency to attempt to control situations and people, and LCSW asked her to list things she was attempting to control for others that does not actually involve her. She reported: my sister is using pain pills, my sister and mother don't get along, my son is likely going to relapse, my daughter is autistic and I worry about her doing well, I help my sister-in-law study for school, I help my nephew feel more safe and secure since his mom isn't doing well, and I like to watch my granddaughter. Sharon Lawrence was able to reflect on that list, and understand that a majority of those things were out of her control and not things she could influence. We discussed ways to set appropriate boundaries with her family and friends in order to limit what she has to carry emotionally. Sharon Lawrence was in agreement with this idea, but stated the idea of setting boundaries was anxiety producing. This led to a conversation about sitting with a feeling (in this case guilt), and being able to move on from it.   Suicidal/Homicidal: No  Therapist Response: Sharon Lawrence was guarded at the beginning of our session but was able to open up more as time went on. She showed excellent insight regarding her mental health and ways  she could improve it. She continues to have panic attacks, but reports they have decreased. We will continue to utilize CBT and motivational interviewing in order to assist her in managing anxiety and setting appropriate boundaries with individuals in her life.   Plan: Return again in 2 weeks.  Diagnosis: Axis I: Panic Disorder    Axis II: No diagnosis    Alden Hipp, LCSW 12/31/2017

## 2017-12-31 NOTE — ED Provider Notes (Signed)
[facility] EMERGENCY DEPARTMENT Provider Note   CSN: [id number] Arrival date & time: 12/31/17  1832     History   Chief Complaint Chief Complaint  Patient presents with  . Chest Pain    HPI Sharon Lawrence is a 52 y.o. female.  HPI  The patient is a 52 year old female, she has a known history of myocardial infarction, she had a ST elevation MI on September 15, 2017, she was found to have a plaque rupture with an 80% occlusion in the right coronary artery and required a drug-eluting stent.  Since that time she has been on Plavix and a baby aspirin and doing very well.  She reports that when she woke up this morning at 5:00 AM she was having some right-sided chest pain, this seems to get worse and get better and fluctuate throughout the day.  It is not exacerbated with any movements or exertion and is not made better with any movements.  She has no shortness of breath, no diaphoresis, no nausea or vomiting and has not had any swelling in her legs.  She feels like she slept abnormally on her arm last night.  The pain does radiate to the scapular area.  There is no coughing, fever, shortness of breath and taking a deep breath does not make this worse.  It does seem to get somewhat worse when she tries to move her arm around.  Past Medical History:  Diagnosis Date  . ADHD (attention deficit hyperactivity disorder)   . Anxiety   . B12 deficiency 02/07/2011  . Back pain   . CAD (coronary artery disease)    a. s/p recent STEMI on 09/15/2017 with DES to RCA  . Celiac disease   . Folic acid deficiency 69/06/8544  . Headache   . Iron deficiency anemia 02/07/2011  . Myocardial infarction ([place]) 09/15/2017    Patient Active Problem List   Diagnosis Date Noted  . Abnormal MRI, lumbar spine (07/16/2016) 12/29/2017  . DDD (degenerative disc disease), lumbar 12/29/2017  . DDD (degenerative disc disease), thoracic 12/29/2017  . Lumbar facet arthropathy (Bilateral) 12/29/2017  . Lumbar facet syndrome  (Bilateral) 12/29/2017  . Long term current use of anticoagulant therapy (Plavix) 12/29/2017  . Neurogenic pain 12/29/2017  . Chronic musculoskeletal pain 12/29/2017  . Chronic low back pain (Primary Area of Pain) (Bilateral) (R>L) w/ sciatica (Right) 12/03/2017  . Chronic lower extremity pain (Secondary Area of Pain) (Right) 12/03/2017  . Chronic sacroiliac joint pain (Bilateral) (L>R) 12/03/2017  . Chronic pain syndrome 12/03/2017  . Long term current use of opiate analgesic 12/03/2017  . Pharmacologic therapy 12/03/2017  . Disorder of skeletal system 12/03/2017  . Problems influencing health status 12/03/2017  . Hypothyroid 11/06/2017  . History of ST elevation myocardial infarction (STEMI) 11/06/2017  . GI bleed 11/06/2017  . GERD without esophagitis 11/06/2017  . Bleeding per rectum 11/06/2017  . Bilateral lower extremity edema 11/06/2017  . Benign essential hypertension 11/06/2017  . Back muscle spasm 11/06/2017  . Anxiety disorder 11/06/2017  . Anemia 11/06/2017  . ADD (attention deficit disorder) 11/06/2017  . Syncope 11/06/2017  . Sessile colonic polyp 11/06/2017  . Right-sided low back pain with sciatica 11/06/2017  . Refused influenza vaccine 11/06/2017  . RAD (reactive airway disease), moderate persistent, uncomplicated 27/05/5007  . Other chronic pain 11/06/2017  . Migraine 11/06/2017  . Major depressive disorder 11/06/2017  . Insomnia 11/06/2017  . IDA (iron deficiency anemia) 11/06/2017  . Hematochezia 09/25/2017  . STEMI (ST elevation  myocardial infarction) ([place]), 09/15/17 09/25/2017  . Hypotension 09/25/2017  . Morbidly obese ([place]) 09/25/2017  . Elevated LFTs 09/25/2017  . Chronic diarrhea, with Celiac disease 09/25/2017  . Former smoker   . Hyperlipidemia 09/17/2017  . Tobacco use 09/17/2017  . Acute ST elevation myocardial infarction (STEMI) of inferior wall ([place]) 09/15/2017  . Family hx of colon cancer 09/11/2017  . Non-intractable vomiting with nausea  09/11/2017  . Celiac disease 09/11/2017  . Vaginal condyloma 06/22/2013  . Obesity 06/22/2013  . Fatty liver disease, nonalcoholic 16/12/9602  . Folic acid deficiency 54/11/8117  . Iron deficiency 02/07/2011  . B12 deficiency 02/07/2011    Past Surgical History:  Procedure Laterality Date  . CHOLECYSTECTOMY    . COLONOSCOPY WITH PROPOFOL N/A 09/26/2017   Procedure: COLONOSCOPY WITH PROPOFOL;  Surgeon: Rogene Houston, MD;  Location: [facility];  Service: Endoscopy;  Laterality: N/A;  . CORONARY STENT INTERVENTION Right 09/15/2017   Procedure: CORONARY STENT INTERVENTION;  Surgeon: Jettie Booze, MD;  Location: [place] [facility];  Service: Cardiovascular;  Laterality: Right;  RCA  . KNEE CARTILAGE SURGERY Right   . LEFT HEART CATH AND CORONARY ANGIOGRAPHY N/A 09/15/2017   Procedure: LEFT HEART CATH AND CORONARY ANGIOGRAPHY;  Surgeon: Jettie Booze, MD;  Location: [place] [facility];  Service: Cardiovascular;  Laterality: N/A;  . TONSILLECTOMY       OB History    Gravida  3   Para  3   Term      Preterm  3   AB      Living  3     SAB      TAB      Ectopic      Multiple      Live Births  3            Home Medications    Prior to Admission medications   Medication Sig Start Date End Date Taking? Authorizing Provider  aspirin 81 MG chewable tablet Chew 1 tablet (81 mg total) by mouth daily. 09/18/17   Cheryln Manly, NP  atorvastatin (LIPITOR) 40 MG tablet TAKE 1 TABLET BY MOUTH ONCE DAILY AT 6PM. 10/27/17   Arnoldo Lenis, MD  baclofen (LIORESAL) 20 MG tablet Take 1 tablet (20 mg total) by mouth daily. 12/29/17 03/29/18  Milinda Pointer, MD  cholecalciferol (VITAMIN D) 1000 units tablet Take 5,000 Units by mouth daily.    [provider]  clonazePAM (KLONOPIN) 0.5 MG tablet Take 1 tablet (0.5 mg total) by mouth 2 (two) times daily as needed for anxiety. ONLY FOR SEVERE PANIC ATTACKS 11/17/17   Ursula Alert, MD  clopidogrel  (PLAVIX) 75 MG tablet Take 1 tablet (75 mg total) by mouth daily. 10/10/17 10/11/18  Strader, Fransisco Hertz, PA-C  Cyanocobalamin (VITAMIN B-12 IJ) Inject as directed every 30 (thirty) days.     [provider]  DULoxetine (CYMBALTA) 30 MG capsule Take 1 capsule (30 mg total) by mouth 2 (two) times daily. 12/19/17   Ursula Alert, MD  fluticasone (FLONASE) 50 MCG/ACT nasal spray Place into the nose.    [provider]  gabapentin (NEURONTIN) 300 MG capsule Take 3 capsules (900 mg total) by mouth at bedtime. 12/29/17 03/29/18  Milinda Pointer, MD  hydrochlorothiazide (HYDRODIURIL) 50 MG tablet Take 50 mg by mouth as needed.    [provider]  lactobacillus acidophilus (BACID) TABS tablet Take 1 tablet by mouth daily.    [provider]  levonorgestrel (MIRENA) 20  MCG/24HR IUD by Intrauterine route.    [provider]  metoprolol tartrate (LOPRESSOR) 25 MG tablet Take 0.5 tablets (12.5 mg total) by mouth 2 (two) times daily. 10/10/17   Strader, Fransisco Hertz, PA-C  nitroGLYCERIN (NITROSTAT) 0.4 MG SL tablet Place 1 tablet (0.4 mg total) under the tongue every 5 (five) minutes as needed for chest pain. 09/17/17   Cheryln Manly, NP  nystatin (MYCOSTATIN/NYSTOP) powder Apply topically.    [provider]  ondansetron (ZOFRAN-ODT) 8 MG disintegrating tablet Take 1 tablet by mouth 3 (three) times daily as needed for nausea.  08/11/17   [provider]  oxyCODONE (OXY IR/ROXICODONE) 5 MG immediate release tablet Take 1 tablet (5 mg total) by mouth every 6 (six) hours as needed for severe pain. Must last 30 days. 12/29/17 01/28/18  Milinda Pointer, MD  pantoprazole (PROTONIX) 40 MG tablet TAKE 1 TABLET BY MOUTH ONCE DAILY BEFORE BREAKFAST. 10/27/17   Arnoldo Lenis, MD  promethazine (PHENERGAN) 25 MG tablet Take 25 mg by mouth 3 (three) times daily as needed for nausea.  08/11/17   [provider]  ranitidine (ZANTAC) 150 MG tablet Take  150 mg by mouth daily.    [provider]  torsemide (DEMADEX) 20 MG tablet  10/17/17   [provider]  traZODone (DESYREL) 100 MG tablet Take 1-1.5 tablets (100-150 mg total) by mouth at bedtime as needed for sleep. 12/19/17   Ursula Alert, MD    Family History Family History  Problem Relation Age of Onset  . Cancer Mother   . Hypertension Mother   . Hypertension Father   . Hypothyroidism Father   . Cancer Sister   . Anxiety disorder Sister   . Depression Sister   . CAD Maternal Grandfather   . Cirrhosis Paternal Grandfather   . CAD Paternal Grandmother   . Diabetes Paternal Grandmother   . Hypothyroidism Paternal Grandmother   . Drug abuse Son   . Bipolar disorder Son     Social History Social History   Tobacco Use  . Smoking status: Former Smoker    Packs/day: 0.25    Years: 12.00    Pack years: 3.00    Types: Cigarettes    Last attempt to quit: 04/04/2017    Years since quitting: 0.7  . Smokeless tobacco: Never Used  Substance Use Topics  . Alcohol use: No  . Drug use: No     Allergies   Bee venom; Erythromycin; Cephalexin; Neosporin original [bacitracin-neomycin-polymyxin]; Prednisone; Sulfa antibiotics; Tobrex [tobramycin]; Tramadol; and Penicillins   Review of Systems Review of Systems  All other systems reviewed and are negative.    Physical Exam Updated Vital Signs Ht 1.524 m (5')   Wt 88 kg   BMI 37.89 kg/m   Physical Exam  Constitutional: She appears well-developed and well-nourished. No distress.  HENT:  Head: Normocephalic and atraumatic.  Mouth/Throat: Oropharynx is clear and moist. No oropharyngeal exudate.  Eyes: Pupils are equal, round, and reactive to light. Conjunctivae and EOM are normal. Right eye exhibits no discharge. Left eye exhibits no discharge. No scleral icterus.  Neck: Normal range of motion. Neck supple. No JVD present. No thyromegaly present.  Cardiovascular: Normal rate, regular rhythm, normal heart  sounds and intact distal pulses. Exam reveals no gallop and no friction rub.  No murmur heard. Pulmonary/Chest: Effort normal and breath sounds normal. No respiratory distress. She has no wheezes. She has no rales. She exhibits tenderness ( There is reproducible tenderness over the  chest wall on the right.  She states it feels exactly the same as her pain.  This is made worse with range of motion of the arm as well as palpation over the right pectoral muscle).  Abdominal: Soft. Bowel sounds are normal. She exhibits no distension and no mass. There is no tenderness.  Musculoskeletal: Normal range of motion. She exhibits no edema or tenderness.  Lymphadenopathy:    She has no cervical adenopathy.  Neurological: She is alert. Coordination normal.  Skin: Skin is warm and dry. No rash noted. No erythema.  Psychiatric: She has a normal mood and affect. Her behavior is normal.  Nursing note and vitals reviewed.    ED Treatments / Results  Labs (all labs ordered are listed, but only abnormal results are displayed) Labs Reviewed  CBC WITH DIFFERENTIAL/PLATELET - Abnormal; Notable for the following components:      Result Value   Hemoglobin 11.8 (*)    MCH 25.1 (*)    RDW 15.8 (*)    Platelets 534 (*)    All other components within normal limits  COMPREHENSIVE METABOLIC PANEL - Abnormal; Notable for the following components:   Glucose, Bld 130 (*)    Calcium 8.4 (*)    Total Protein 6.3 (*)    Albumin 3.4 (*)    ALT 50 (*)    All other components within normal limits  TROPONIN I    EKG EKG Interpretation  Date/Time:  Wednesday December 31 2017 18:40:41 EDT Ventricular Rate:  70 PR Interval:  118 QRS Duration: 88 QT Interval:  374 QTC Calculation: 403 R Axis:   74 Text Interpretation:  Normal sinus rhythm Normal ECG since last tracing no significant change Confirmed by Noemi Chapel [phone]) on 12/31/2017 8:27:55 PM   Radiology Dg Chest 2 View  Result Date: 12/31/2017 CLINICAL  DATA:  Chest pain. EXAM: CHEST - 2 VIEW COMPARISON:  Radiograph September 15, 2017. FINDINGS: The heart size and mediastinal contours are within normal limits. Both lungs are clear. No pneumothorax or pleural effusion is noted. The visualized skeletal structures are unremarkable. IMPRESSION: No active cardiopulmonary disease. Electronically Signed   By: Marijo Conception, M.D.   On: 12/31/2017 19:05    Procedures Procedures (including critical care time)  Medications Ordered in ED Medications - No data to display   Initial Impression / Assessment and Plan / ED Course  I have reviewed the triage vital signs and the nursing notes.  Pertinent labs & imaging results that were available during my care of the patient were reviewed by me and considered in my medical decision making (see chart for details).  Clinical Course as of Dec 31 2049  Wed Dec 31, 2017  2028 The patient has a x-ray which shows no signs of infiltrate pneumothorax or abnormal mediastinum.  According to my interpretation of this two-view PA and lateral chest x-ray there is no acute findings.  I agree with the radiologist.  EKG shows no acute findings, unremarkable and unchanged compared to prior  Laboratory work-up shows no leukocytosis, no abnormal metabolic panel other than a slight hyperglycemia and no signs of elevation in troponin.   [BM]    Clinical Course User Index [BM] Noemi Chapel, MD    Thankfully the patient's labs are unremarkable, at this time I do not think that the patient's pain is related to an acute coronary syndrome or other pathologic cause.  She has a totally unremarkable EKG with a normal troponin after 15 hours of constant  pain.  She will be discharged home to follow-up in the outpatient setting, she is agreeable to this plan, she is compliant with her medications and is understanding of the reasons for return.  Final Clinical Impressions(s) / ED Diagnoses   Final diagnoses:  Right-sided chest pain     ED Discharge Orders    None       Noemi Chapel, MD 12/31/17 2051

## 2017-12-31 NOTE — Discharge Instructions (Signed)
Your caregiver has diagnosed you as having chest pain that is nonspecific for one problem. This means that after looking at you and examining you and ordering tests (such as blood work, chest x-rays and EKG), your caregiver does not believe that the problem is serious enough to need watching in the hospital. This judgment is often made after testing shows no acute heart attack and you are at low risk for sudden acute heart condition. Chest pain comes from many different causes.  Seek immediate medical attention if:  You have severe chest pain, especially if the pain is crushing or pressure-like and spreads to the arms, back, neck, or jaw, or if you have sweating, nausea, shortness of breath. This is an emergency. Don't wait to see if the pain will go away. Get medical help at once. Call 911 immediately. Do not drive herself to the hospital.  Your chest pain gets worse and does not go away with rest.  You have an attack of chest pain lasting longer than usual, despite rest and treatment with the medications your caregiver has prescribed  You awaken from sleep with chest pain or shortness of breath.  You feel faint or dizzy  You have chest pain not typical of your usual pain for which you originally saw your caregiver.  You must have a repeat evaluation within 24 hours for a recheck of your heart.  Please call your doctor this morning to schedule this appointment. If you do not have a family doctor, please see the list of doctors below.

## 2017-12-31 NOTE — ED Triage Notes (Signed)
PT c/o right sided sharp/heavy chest pain radiating to her back that started this am with nausea.

## 2017-12-31 NOTE — ED Notes (Signed)
Pt ambulatory to waiting room. Pt verbalized understanding of discharge instructions.   

## 2018-01-16 ENCOUNTER — Encounter: Payer: Self-pay | Admitting: Cardiology

## 2018-01-16 ENCOUNTER — Ambulatory Visit (INDEPENDENT_AMBULATORY_CARE_PROVIDER_SITE_OTHER): Payer: 59 | Admitting: Cardiology

## 2018-01-16 ENCOUNTER — Encounter: Payer: Self-pay | Admitting: Psychiatry

## 2018-01-16 ENCOUNTER — Other Ambulatory Visit: Payer: Self-pay

## 2018-01-16 ENCOUNTER — Ambulatory Visit: Payer: 59 | Admitting: Psychiatry

## 2018-01-16 VITALS — BP 118/68 | HR 70 | Ht 64.0 in | Wt 204.0 lb

## 2018-01-16 VITALS — BP 116/80 | HR 70 | Temp 97.6°F | Wt 204.2 lb

## 2018-01-16 DIAGNOSIS — F410 Panic disorder [episodic paroxysmal anxiety] without agoraphobia: Secondary | ICD-10-CM | POA: Diagnosis not present

## 2018-01-16 DIAGNOSIS — F411 Generalized anxiety disorder: Secondary | ICD-10-CM | POA: Diagnosis not present

## 2018-01-16 DIAGNOSIS — F330 Major depressive disorder, recurrent, mild: Secondary | ICD-10-CM

## 2018-01-16 DIAGNOSIS — E782 Mixed hyperlipidemia: Secondary | ICD-10-CM | POA: Diagnosis not present

## 2018-01-16 DIAGNOSIS — F5105 Insomnia due to other mental disorder: Secondary | ICD-10-CM | POA: Diagnosis not present

## 2018-01-16 DIAGNOSIS — I2510 Atherosclerotic heart disease of native coronary artery without angina pectoris: Secondary | ICD-10-CM | POA: Diagnosis not present

## 2018-01-16 MED ORDER — CLONAZEPAM 0.5 MG PO TABS: 0.2500 mg | ORAL_TABLET | ORAL | 1 refills | Status: DC

## 2018-01-16 MED ORDER — DULOXETINE HCL 60 MG PO CPEP: 60.0000 mg | ORAL_CAPSULE | Freq: Every day | ORAL | 0 refills | Status: DC

## 2018-01-16 MED ORDER — METOPROLOL TARTRATE 25 MG PO TABS: 25.0000 mg | ORAL_TABLET | Freq: Two times a day (BID) | ORAL | 3 refills | Status: DC

## 2018-01-16 MED ORDER — TRAZODONE HCL 100 MG PO TABS: 100.0000 mg | ORAL_TABLET | Freq: Every evening | ORAL | 1 refills | Status: DC | PRN

## 2018-01-16 NOTE — Progress Notes (Signed)
Clinical Summary Sharon Lawrence is a 52 y.o.female seen today for follow up of the following medical problems.   1. CAD - 09/2017 admitted with STEMI, receved PCI to RCA. LVgram 55-65%.  - brillinta changed to plavix due to hematochezia. Lopressor stopped at that time due to soft bp's but was restarted at f/u. Has not been on ACE-I  - seen in ER 12/2017 with right sided chest pain. From notes pain reproducible to palpation in ER.  - mild symptoms at times with movement ongoing   2. Hematochezia - admission 09/2017  - brillinta was changed to plavix during that admission, no recurrent issues.   3. Hyperlipidemia - limiting statin dosing due to previous elevated LFTs - last LFTs nearly normal   Past Medical History:  Diagnosis Date  . ADHD (attention deficit hyperactivity disorder)   . Anxiety   . B12 deficiency 02/07/2011  . Back pain   . CAD (coronary artery disease)    a. s/p recent STEMI on 09/15/2017 with DES to RCA  . Celiac disease   . Folic acid deficiency 70/03/7791  . Headache   . Iron deficiency anemia 02/07/2011  . Myocardial infarction ([place]) 09/15/2017     Allergies  Allergen Reactions  . Bee Venom Anaphylaxis  . Erythromycin Anaphylaxis  . Cephalexin   . Neosporin Original [Bacitracin-Neomycin-Polymyxin] Swelling    Swelling at site of application  . Prednisone   . Sulfa Antibiotics Swelling    Sulfa eye drops caused eyes to swell  . Tobrex [Tobramycin] Swelling  . Tramadol Hives  . Penicillins Rash    Has patient had a PCN reaction causing immediate rash, facial/tongue/throat swelling, SOB or lightheadedness with hypotension: Yes Has patient had a PCN reaction causing severe rash involving mucus membranes or skin necrosis: No Has patient had a PCN reaction that required hospitalization: No Has patient had a PCN reaction occurring within the last 10 years: No If all of the above answers are "NO", then may proceed with Cephalosporin use.      Current  Outpatient Medications  Medication Sig Dispense Refill  . aspirin 81 MG chewable tablet Chew 1 tablet (81 mg total) by mouth daily.    Marland Kitchen atorvastatin (LIPITOR) 40 MG tablet TAKE 1 TABLET BY MOUTH ONCE DAILY AT 6PM. 30 tablet 6  . baclofen (LIORESAL) 20 MG tablet Take 1 tablet (20 mg total) by mouth daily. 30 tablet 2  . cholecalciferol (VITAMIN D) 1000 units tablet Take 5,000 Units by mouth daily.    Derrill Memo ON 01/22/2018] clonazePAM (KLONOPIN) 0.5 MG tablet Take 0.5-1 tablets (0.25-0.5 mg total) by mouth as directed. Twice a day as needed for severe panic sx. 45 tablet 1  . clopidogrel (PLAVIX) 75 MG tablet Take 1 tablet (75 mg total) by mouth daily. 90 tablet 3  . Cyanocobalamin (VITAMIN B-12 IJ) Inject as directed every 30 (thirty) days.     . DULoxetine (CYMBALTA) 60 MG capsule Take 1 capsule (60 mg total) by mouth daily. 90 capsule 0  . famotidine (PEPCID) 20 MG tablet   3  . fluticasone (FLONASE) 50 MCG/ACT nasal spray Place into the nose.    . gabapentin (NEURONTIN) 300 MG capsule Take 3 capsules (900 mg total) by mouth at bedtime. 90 capsule 2  . hydrochlorothiazide (HYDRODIURIL) 50 MG tablet Take 50 mg by mouth as needed.    . lactobacillus acidophilus (BACID) TABS tablet Take 1 tablet by mouth daily.    Marland Kitchen levonorgestrel (MIRENA) 20 MCG/24HR IUD  by Intrauterine route.    . metoprolol tartrate (LOPRESSOR) 25 MG tablet Take 0.5 tablets (12.5 mg total) by mouth 2 (two) times daily. 90 tablet 3  . nitroGLYCERIN (NITROSTAT) 0.4 MG SL tablet Place 1 tablet (0.4 mg total) under the tongue every 5 (five) minutes as needed for chest pain. 25 tablet 2  . nystatin (MYCOSTATIN/NYSTOP) powder Apply topically.    Marland Kitchen ofloxacin (OCUFLOX) 0.3 % ophthalmic solution   0  . ondansetron (ZOFRAN-ODT) 8 MG disintegrating tablet Take 1 tablet by mouth 3 (three) times daily as needed for nausea.   3  . oxyCODONE (OXY IR/ROXICODONE) 5 MG immediate release tablet Take 1 tablet (5 mg total) by mouth every 6 (six)  hours as needed for severe pain. Must last 30 days. 120 tablet 0  . pantoprazole (PROTONIX) 40 MG tablet TAKE 1 TABLET BY MOUTH ONCE DAILY BEFORE BREAKFAST. 30 tablet 6  . predniSONE (DELTASONE) 20 MG tablet   0  . promethazine (PHENERGAN) 25 MG tablet Take 25 mg by mouth 3 (three) times daily as needed for nausea.   2  . ranitidine (ZANTAC) 150 MG tablet Take 150 mg by mouth daily.    Marland Kitchen torsemide (DEMADEX) 20 MG tablet   2  . traZODone (DESYREL) 100 MG tablet Take 1-1.5 tablets (100-150 mg total) by mouth at bedtime as needed for sleep. 45 tablet 1   No current facility-administered medications for this visit.      Past Surgical History:  Procedure Laterality Date  . CHOLECYSTECTOMY    . COLONOSCOPY WITH PROPOFOL N/A 09/26/2017   Procedure: COLONOSCOPY WITH PROPOFOL;  Surgeon: Rogene Houston, MD;  [facility];  Service: Endoscopy;  Laterality: N/A;  . CORONARY STENT INTERVENTION Right 09/15/2017   Procedure: CORONARY STENT INTERVENTION;  Surgeon: Jettie Booze, MD;  Location: [facility];  Service: Cardiovascular;  Laterality: Right;  RCA  . KNEE CARTILAGE SURGERY Right   . LEFT HEART CATH AND CORONARY ANGIOGRAPHY N/A 09/15/2017   Procedure: LEFT HEART CATH AND CORONARY ANGIOGRAPHY;  Surgeon: Jettie Booze, MD;  Location: [facility];  Service: Cardiovascular;  Laterality: N/A;  . TONSILLECTOMY       Allergies  Allergen Reactions  . Bee Venom Anaphylaxis  . Erythromycin Anaphylaxis  . Cephalexin   . Neosporin Original [Bacitracin-Neomycin-Polymyxin] Swelling    Swelling at site of application  . Prednisone   . Sulfa Antibiotics Swelling    Sulfa eye drops caused eyes to swell  . Tobrex [Tobramycin] Swelling  . Tramadol Hives  . Penicillins Rash    Has patient had a PCN reaction causing immediate rash, facial/tongue/throat swelling, SOB or lightheadedness with hypotension: Yes Has patient had a PCN reaction causing severe rash involving  mucus membranes or skin necrosis: No Has patient had a PCN reaction that required hospitalization: No Has patient had a PCN reaction occurring within the last 10 years: No If all of the above answers are "NO", then may proceed with Cephalosporin use.       Family History  Problem Relation Age of Onset  . Cancer Mother   . Hypertension Mother   . Hypertension Father   . Hypothyroidism Father   . Cancer Sister   . Anxiety disorder Sister   . Depression Sister   . CAD Maternal Grandfather   . Cirrhosis Paternal Grandfather   . CAD Paternal Grandmother   . Diabetes Paternal Grandmother   . Hypothyroidism Paternal Grandmother   . Drug abuse  Son   . Bipolar disorder Son      Social History Sharon Lawrence reports that she quit smoking about 9 months ago. Her smoking use included cigarettes. She has a 3.00 pack-year smoking history. She has never used smokeless tobacco. Sharon Lawrence reports that she does not drink alcohol.   Review of Systems CONSTITUTIONAL: No weight loss, fever, chills, weakness or fatigue.  HEENT: Eyes: No visual loss, blurred vision, double vision or yellow sclerae.No hearing loss, sneezing, congestion, runny nose or sore throat.  SKIN: No rash or itching.  CARDIOVASCULAR: per hpi RESPIRATORY: No shortness of breath, cough or sputum.  GASTROINTESTINAL: No anorexia, nausea, vomiting or diarrhea. No abdominal pain or blood.  GENITOURINARY: No burning on urination, no polyuria NEUROLOGICAL: No headache, dizziness, syncope, paralysis, ataxia, numbness or tingling in the extremities. No change in bowel or bladder control.  MUSCULOSKELETAL: No muscle, back pain, joint pain or stiffness.  LYMPHATICS: No enlarged nodes. No history of splenectomy.  PSYCHIATRIC: No history of depression or anxiety.  ENDOCRINOLOGIC: No reports of sweating, cold or heat intolerance. No polyuria or polydipsia.  Marland Kitchen   Physical Examination Vitals:   01/16/18 1347  BP: 118/68  Pulse: 70  SpO2:  95%   Vitals:   01/16/18 1347  Weight: 204 lb (92.5 kg)  Height: 5' 4"  (1.626 m)    Gen: resting comfortably, no acute distress HEENT: no scleral icterus, pupils equal round and reactive, no palptable cervical adenopathy,  CV: RRR, no m/r/g, no jvd Resp: Clear to auscultation bilaterally GI: abdomen is soft, non-tender, non-distended, normal bowel sounds, no hepatosplenomegaly MSK: extremities are warm, no edema.  Skin: warm, no rash Neuro:  no focal deficits Psych: appropriate affect   Diagnostic Studies 09/2017 cath  Prox RCA lesion is 80% stenosed. It appeared to be a ruptured plaque.  A drug-eluting stent was successfully placed using a STENT SYNERGY DES 4X24.  Post intervention, there is a 0% residual stenosis.  The left ventricular systolic function is normal.  LV end diastolic pressure is low.  The left ventricular ejection fraction is 55-65% by visual estimate.  Severe spasm in the right radial artery, requiring heavy sedation and IA NTG. 4Fr diagnostic and 5 Fr Guide catheter used.    Assessment and Plan  1. CAD - recent MSK pain, not cardiac in description - continue current meds. She is taking lopressor 34m bid at home, difficultly cutting pill, we will update the Rx.   2. Hyperlipidemia - continue statin, dosing limited due to prior elevation of LFTs    F/u 6 months    JArnoldo Lenis M.D.

## 2018-01-16 NOTE — Patient Instructions (Signed)
Medication Instructions:  TAKE METOPROLOL 25 MG TWO TIMES DAILY   Labwork: NONE  Testing/Procedures: NONE  Follow-Up: Your physician wants you to follow-up in: 6 MONTHS.  You will receive a reminder letter in the mail two months in advance. If you don't receive a letter, please call our office to schedule the follow-up appointment.   Any Other Special Instructions Will Be Listed Below (If Applicable).     If you need a refill on your cardiac medications before your next appointment, please call your pharmacy.

## 2018-01-16 NOTE — Progress Notes (Signed)
[facility] MD  OP Progress Note  01/16/2018 12:16 PM Sharon Lawrence  MRN:  [id number]  Chief Complaint: ' I am here for follow up." Chief Complaint    Follow-up; Medication Refill     HPI: Sharon Lawrence is a 52 year old Caucasian female who is widowed, employed as a Marine scientist, lives in [place], has a history of coronary artery disease status post STEMI on 09/15/2017, status post stent placement, hyperlipidemia, chronic anemia, presented to the clinic today for a follow-up visit.  Patient today reports she went to the emergency department recently due to her chest pain.  She however reports it may have been anxiety induced since she was medically cleared and was told it was not due to her cardiac issues.  She reports she was going through a lot of stressors.  She reports her son who was in jail was  released on bail.  Patient reports he does have upcoming court hearings coming up.  She reports she has set some rules for him and he is so far following them.  She reports she is hopeful that he will find the right path this time and will not go back to abusing drugs again.  She reports him as a constant stressor for her.  She also reports some job related stressors.  She reports she has been taking the Cymbalta 30 mg since there was a problem getting it approved.  She reports she was told by pharmacy that they will reach out to the clinic but she did not hear back.  Discussed with patient that we did not receive any messages from the pharmacy.  Discussed with patient that her dosage can be readjusted to 60 mg today.  She agrees with plan.  She reports she continues to have some panic symptoms on and off.  She continues to take Klonopin and she takes it up to 4 times a week.  Patient denies any suicidality.  Patient denies any perceptual disturbances.  Patient continues to be in psychotherapy sessions which are going well.   Visit Diagnosis:    ICD-10-CM   1. Panic disorder F41.0 DULoxetine (CYMBALTA) 60 MG capsule     clonazePAM (KLONOPIN) 0.5 MG tablet    traZODone (DESYREL) 100 MG tablet  2. GAD (generalized anxiety disorder) F41.1 DULoxetine (CYMBALTA) 60 MG capsule    clonazePAM (KLONOPIN) 0.5 MG tablet    traZODone (DESYREL) 100 MG tablet  3. MDD (major depressive disorder), recurrent episode, mild (HCC) F33.0 DULoxetine (CYMBALTA) 60 MG capsule  4. Insomnia due to mental disorder F51.05 traZODone (DESYREL) 100 MG tablet    Past Psychiatric History: Have reviewed past psychiatric history from my progress note on 11/17/2017.  Past trials of Zoloft, Paxil  Past Medical History:  Past Medical History:  Diagnosis Date  . ADHD (attention deficit hyperactivity disorder)   . Anxiety   . B12 deficiency 02/07/2011  . Back pain   . CAD (coronary artery disease)    a. s/p recent STEMI on 09/15/2017 with DES to RCA  . Celiac disease   . Folic acid deficiency 54/08/2701  . Headache   . Iron deficiency anemia 02/07/2011  . Myocardial infarction ([place]) 09/15/2017    Past Surgical History:  Procedure Laterality Date  . CHOLECYSTECTOMY    . COLONOSCOPY WITH PROPOFOL N/A 09/26/2017   Procedure: COLONOSCOPY WITH PROPOFOL;  Surgeon: Rogene Houston, MD;  [facility];  Service: Endoscopy;  Laterality: N/A;  . CORONARY STENT INTERVENTION Right 09/15/2017   Procedure: CORONARY STENT INTERVENTION;  Surgeon: Jettie Booze, MD;  Location: [facility];  Service: Cardiovascular;  Laterality: Right;  RCA  . KNEE CARTILAGE SURGERY Right   . LEFT HEART CATH AND CORONARY ANGIOGRAPHY N/A 09/15/2017   Procedure: LEFT HEART CATH AND CORONARY ANGIOGRAPHY;  Surgeon: Jettie Booze, MD;  Location: [facility];  Service: Cardiovascular;  Laterality: N/A;  . TONSILLECTOMY      Family Psychiatric History: I have reviewed family psychiatric history from my progress note on 11/17/2017.  Family History:  Family History  Problem Relation Age of Onset  . Cancer Mother   . Hypertension Mother   .  Hypertension Father   . Hypothyroidism Father   . Cancer Sister   . Anxiety disorder Sister   . Depression Sister   . CAD Maternal Grandfather   . Cirrhosis Paternal Grandfather   . CAD Paternal Grandmother   . Diabetes Paternal Grandmother   . Hypothyroidism Paternal Grandmother   . Drug abuse Son   . Bipolar disorder Son     Social History: I have reviewed social history from my progress note on 11/17/2017 Social History   Socioeconomic History  . Marital status: Widowed    Spouse name: Not on file  . Number of children: 3  . Years of education: Not on file  . Highest education level: Associate degree: occupational, Hotel manager, or vocational program  Occupational History  . Not on file  Social Needs  . Financial resource strain: Not hard at all  . Food insecurity:    Worry: Never true    Inability: Never true  . Transportation needs:    Medical: No    Non-medical: No  Tobacco Use  . Smoking status: Former Smoker    Packs/day: 0.25    Years: 12.00    Pack years: 3.00    Types: Cigarettes    Last attempt to quit: 04/04/2017    Years since quitting: 0.7  . Smokeless tobacco: Never Used  Substance and Sexual Activity  . Alcohol use: No  . Drug use: No  . Sexual activity: Not Currently  Lifestyle  . Physical activity:    Days per week: 3 days    Minutes per session: 30 min  . Stress: Very much  Relationships  . Social connections:    Talks on phone: More than three times a week    Gets together: More than three times a week    Attends religious service: More than 4 times per year    Active member of club or organization: No    Attends meetings of clubs or organizations: Never    Relationship status: Widowed  Other Topics Concern  . Not on file  Social History Narrative  . Not on file    Allergies:  Allergies  Allergen Reactions  . Bee Venom Anaphylaxis  . Erythromycin Anaphylaxis  . Cephalexin   . Neosporin Original [Bacitracin-Neomycin-Polymyxin]  Swelling    Swelling at site of application  . Prednisone   . Sulfa Antibiotics Swelling    Sulfa eye drops caused eyes to swell  . Tobrex [Tobramycin] Swelling  . Tramadol Hives  . Penicillins Rash    Has patient had a PCN reaction causing immediate rash, facial/tongue/throat swelling, SOB or lightheadedness with hypotension: Yes Has patient had a PCN reaction causing severe rash involving mucus membranes or skin necrosis: No Has patient had a PCN reaction that required hospitalization: No Has patient had a PCN reaction occurring within the last 10 years: No  If all of the above answers are "NO", then may proceed with Cephalosporin use.     Metabolic Disorder Labs: Lab Results  Component Value Date   HGBA1C 5.6 09/16/2017   MPG 114 09/16/2017   MPG 123 (H) 07/07/2013   No results found for: PROLACTIN Lab Results  Component Value Date   CHOL 77 09/16/2017   TRIG 64 09/16/2017   HDL 19 (L) 09/16/2017   CHOLHDL 4.1 09/16/2017   VLDL 13 09/16/2017   LDLCALC 45 09/16/2017   LDLCALC 97 07/07/2013   Lab Results  Component Value Date   TSH 2.471 07/07/2013   TSH 1.745 06/03/2012    Therapeutic Level Labs: No results found for: LITHIUM No results found for: VALPROATE No components found for:  CBMZ  Current Medications: Current Outpatient Medications  Medication Sig Dispense Refill  . aspirin 81 MG chewable tablet Chew 1 tablet (81 mg total) by mouth daily.    Marland Kitchen atorvastatin (LIPITOR) 40 MG tablet TAKE 1 TABLET BY MOUTH ONCE DAILY AT 6PM. 30 tablet 6  . baclofen (LIORESAL) 20 MG tablet Take 1 tablet (20 mg total) by mouth daily. 30 tablet 2  . cholecalciferol (VITAMIN D) 1000 units tablet Take 5,000 Units by mouth daily.    Derrill Memo ON 01/22/2018] clonazePAM (KLONOPIN) 0.5 MG tablet Take 0.5-1 tablets (0.25-0.5 mg total) by mouth as directed. Twice a day as needed for severe panic sx. 45 tablet 1  . clopidogrel (PLAVIX) 75 MG tablet Take 1 tablet (75 mg total) by mouth  daily. 90 tablet 3  . Cyanocobalamin (VITAMIN B-12 IJ) Inject as directed every 30 (thirty) days.     . fluticasone (FLONASE) 50 MCG/ACT nasal spray Place into the nose.    . gabapentin (NEURONTIN) 300 MG capsule Take 3 capsules (900 mg total) by mouth at bedtime. 90 capsule 2  . hydrochlorothiazide (HYDRODIURIL) 50 MG tablet Take 50 mg by mouth as needed.    . lactobacillus acidophilus (BACID) TABS tablet Take 1 tablet by mouth daily.    Marland Kitchen levonorgestrel (MIRENA) 20 MCG/24HR IUD by Intrauterine route.    . metoprolol tartrate (LOPRESSOR) 25 MG tablet Take 0.5 tablets (12.5 mg total) by mouth 2 (two) times daily. 90 tablet 3  . nitroGLYCERIN (NITROSTAT) 0.4 MG SL tablet Place 1 tablet (0.4 mg total) under the tongue every 5 (five) minutes as needed for chest pain. 25 tablet 2  . nystatin (MYCOSTATIN/NYSTOP) powder Apply topically.    . ondansetron (ZOFRAN-ODT) 8 MG disintegrating tablet Take 1 tablet by mouth 3 (three) times daily as needed for nausea.   3  . oxyCODONE (OXY IR/ROXICODONE) 5 MG immediate release tablet Take 1 tablet (5 mg total) by mouth every 6 (six) hours as needed for severe pain. Must last 30 days. 120 tablet 0  . pantoprazole (PROTONIX) 40 MG tablet TAKE 1 TABLET BY MOUTH ONCE DAILY BEFORE BREAKFAST. 30 tablet 6  . promethazine (PHENERGAN) 25 MG tablet Take 25 mg by mouth 3 (three) times daily as needed for nausea.   2  . ranitidine (ZANTAC) 150 MG tablet Take 150 mg by mouth daily.    Marland Kitchen torsemide (DEMADEX) 20 MG tablet   2  . traZODone (DESYREL) 100 MG tablet Take 1-1.5 tablets (100-150 mg total) by mouth at bedtime as needed for sleep. 45 tablet 1  . DULoxetine (CYMBALTA) 60 MG capsule Take 1 capsule (60 mg total) by mouth daily. 90 capsule 0  . famotidine (PEPCID) 20 MG tablet   3  .  ofloxacin (OCUFLOX) 0.3 % ophthalmic solution   0  . predniSONE (DELTASONE) 20 MG tablet   0   No current facility-administered medications for this visit.       Musculoskeletal: Strength & Muscle Tone: within normal limits Gait & Station: normal Patient leans: N/A  Psychiatric Specialty Exam: Review of Systems  Psychiatric/Behavioral: Positive for depression. The patient is nervous/anxious.   All other systems reviewed and are negative.   Blood pressure 116/80, pulse 70, temperature 97.6 F (36.4 C), temperature source Oral, weight 204 lb 3.2 oz (92.6 kg).Body mass index is 39.88 kg/m.  General Appearance: Casual  Eye Contact:  Fair  Speech:  Normal Rate  Volume:  Normal  Mood:  Depressed  Affect:  Appropriate  Thought Process:  Goal Directed and Descriptions of Associations: Intact  Orientation:  Full (Time, Place, and Person)  Thought Content: Logical   Suicidal Thoughts:  No  Homicidal Thoughts:  No  Memory:  Immediate;   Fair Recent;   Fair Remote;   Fair  Judgement:  Fair  Insight:  Fair  Psychomotor Activity:  Normal  Concentration:  Concentration: Fair and Attention Span: Fair  Recall:  [employer] of Knowledge: Fair  Language: Fair  Akathisia:  No  Handed:  Right  AIMS (if indicated): denies tremors, rigidity  Assets:  Communication Skills Desire for Improvement Social Support  ADL's:  Intact  Cognition: WNL  Sleep:  Fair   Screenings: PHQ2-9     Office Visit from 12/29/2017 in [place]  PHQ-2 Total Score  0       Assessment and Plan: Karima is a 52 year old Caucasian female, widowed, lives in [place], employed, has a history of depression, anxiety, insomnia, hyperlipidemia, coronary artery disease, recent myocardial infarction status post stent placement, presented to the clinic today for a follow-up visit.  Patient is biologically predisposed given her family history as well as her own health issues.  She also has psychosocial stressors including recent legal issues of her son.  Patient will continue to need medication changes for her depression and anxiety  symptoms.  She will continue psychotherapy sessions.  Plan Panic disorder Increase Cymbalta to 60 mg p.o. daily Continue Klonopin however will change the dosage to 0.25-0.5 mg up to 2 times a day as needed.  I have reviewed [place] controlled substance database.  Patient reports she uses Klonopin up to 4 times a week. She will continue psychotherapy sessions.  For GAD Cymbalta and Klonopin as prescribed  MDD Cymbalta 60 mg p.o. daily Continue CBT  For insomnia Trazodone 100-150 mg p.o. nightly as needed  Follow-up in clinic in 1 month or sooner if needed  More than 50 % of the time was spent for psychoeducation and supportive psychotherapy and care coordination.  This note was generated in part or whole with voice recognition software. Voice recognition is usually quite accurate but there are transcription errors that can and very often do occur. I apologize for any typographical errors that were not detected and corrected.        Ursula Alert, MD 01/16/2018, 12:16 PM

## 2018-01-17 ENCOUNTER — Other Ambulatory Visit: Payer: Self-pay | Admitting: Student

## 2018-01-19 ENCOUNTER — Ambulatory Visit: Payer: 59 | Admitting: Licensed Clinical Social Worker

## 2018-01-26 ENCOUNTER — Ambulatory Visit: Payer: 59 | Attending: Nurse Practitioner | Admitting: Nurse Practitioner

## 2018-01-26 ENCOUNTER — Encounter: Payer: Self-pay | Admitting: Nurse Practitioner

## 2018-01-26 ENCOUNTER — Other Ambulatory Visit: Payer: Self-pay

## 2018-01-26 VITALS — BP 107/68 | HR 78 | Temp 98.2°F | Ht 60.0 in | Wt 195.0 lb

## 2018-01-26 DIAGNOSIS — Z880 Allergy status to penicillin: Secondary | ICD-10-CM | POA: Insufficient documentation

## 2018-01-26 DIAGNOSIS — Z882 Allergy status to sulfonamides status: Secondary | ICD-10-CM | POA: Diagnosis not present

## 2018-01-26 DIAGNOSIS — Z7902 Long term (current) use of antithrombotics/antiplatelets: Secondary | ICD-10-CM | POA: Diagnosis not present

## 2018-01-26 DIAGNOSIS — Z87891 Personal history of nicotine dependence: Secondary | ICD-10-CM | POA: Diagnosis not present

## 2018-01-26 DIAGNOSIS — F419 Anxiety disorder, unspecified: Secondary | ICD-10-CM | POA: Diagnosis not present

## 2018-01-26 DIAGNOSIS — M1288 Other specific arthropathies, not elsewhere classified, other specified site: Secondary | ICD-10-CM | POA: Insufficient documentation

## 2018-01-26 DIAGNOSIS — I2510 Atherosclerotic heart disease of native coronary artery without angina pectoris: Secondary | ICD-10-CM | POA: Insufficient documentation

## 2018-01-26 DIAGNOSIS — M5136 Other intervertebral disc degeneration, lumbar region: Secondary | ICD-10-CM | POA: Diagnosis not present

## 2018-01-26 DIAGNOSIS — E538 Deficiency of other specified B group vitamins: Secondary | ICD-10-CM | POA: Insufficient documentation

## 2018-01-26 DIAGNOSIS — K760 Fatty (change of) liver, not elsewhere classified: Secondary | ICD-10-CM | POA: Diagnosis not present

## 2018-01-26 DIAGNOSIS — Z793 Long term (current) use of hormonal contraceptives: Secondary | ICD-10-CM | POA: Diagnosis not present

## 2018-01-26 DIAGNOSIS — Z881 Allergy status to other antibiotic agents status: Secondary | ICD-10-CM | POA: Insufficient documentation

## 2018-01-26 DIAGNOSIS — Z79891 Long term (current) use of opiate analgesic: Secondary | ICD-10-CM | POA: Diagnosis not present

## 2018-01-26 DIAGNOSIS — M62830 Muscle spasm of back: Secondary | ICD-10-CM | POA: Diagnosis not present

## 2018-01-26 DIAGNOSIS — I252 Old myocardial infarction: Secondary | ICD-10-CM | POA: Insufficient documentation

## 2018-01-26 DIAGNOSIS — E785 Hyperlipidemia, unspecified: Secondary | ICD-10-CM | POA: Diagnosis not present

## 2018-01-26 DIAGNOSIS — Z888 Allergy status to other drugs, medicaments and biological substances status: Secondary | ICD-10-CM | POA: Insufficient documentation

## 2018-01-26 DIAGNOSIS — I10 Essential (primary) hypertension: Secondary | ICD-10-CM | POA: Diagnosis not present

## 2018-01-26 DIAGNOSIS — G8929 Other chronic pain: Secondary | ICD-10-CM

## 2018-01-26 DIAGNOSIS — Z7982 Long term (current) use of aspirin: Secondary | ICD-10-CM | POA: Insufficient documentation

## 2018-01-26 DIAGNOSIS — M549 Dorsalgia, unspecified: Secondary | ICD-10-CM | POA: Diagnosis present

## 2018-01-26 DIAGNOSIS — Z885 Allergy status to narcotic agent status: Secondary | ICD-10-CM | POA: Diagnosis not present

## 2018-01-26 DIAGNOSIS — Z800 Family history of malignant neoplasm of digestive organs: Secondary | ICD-10-CM | POA: Insufficient documentation

## 2018-01-26 DIAGNOSIS — G894 Chronic pain syndrome: Secondary | ICD-10-CM

## 2018-01-26 DIAGNOSIS — Z7901 Long term (current) use of anticoagulants: Secondary | ICD-10-CM | POA: Insufficient documentation

## 2018-01-26 DIAGNOSIS — M47816 Spondylosis without myelopathy or radiculopathy, lumbar region: Secondary | ICD-10-CM

## 2018-01-26 DIAGNOSIS — Z955 Presence of coronary angioplasty implant and graft: Secondary | ICD-10-CM | POA: Insufficient documentation

## 2018-01-26 DIAGNOSIS — Z79899 Other long term (current) drug therapy: Secondary | ICD-10-CM | POA: Insufficient documentation

## 2018-01-26 DIAGNOSIS — M792 Neuralgia and neuritis, unspecified: Secondary | ICD-10-CM

## 2018-01-26 DIAGNOSIS — M7918 Myalgia, other site: Secondary | ICD-10-CM

## 2018-01-26 DIAGNOSIS — Z8249 Family history of ischemic heart disease and other diseases of the circulatory system: Secondary | ICD-10-CM | POA: Insufficient documentation

## 2018-01-26 DIAGNOSIS — K219 Gastro-esophageal reflux disease without esophagitis: Secondary | ICD-10-CM | POA: Insufficient documentation

## 2018-01-26 DIAGNOSIS — M533 Sacrococcygeal disorders, not elsewhere classified: Secondary | ICD-10-CM | POA: Diagnosis not present

## 2018-01-26 MED ORDER — OXYCODONE HCL 5 MG PO TABS: 5.0000 mg | ORAL_TABLET | Freq: Four times a day (QID) | ORAL | 0 refills | Status: DC | PRN

## 2018-01-26 MED ORDER — BACLOFEN 20 MG PO TABS: 20.0000 mg | ORAL_TABLET | Freq: Every day | ORAL | 2 refills | Status: DC

## 2018-01-26 NOTE — Progress Notes (Signed)
Nursing Pain Medication Assessment:  Safety precautions to be maintained throughout the outpatient stay will include: orient to surroundings, keep bed in low position, maintain call bell within reach at all times, provide assistance with transfer out of bed and ambulation.  Medication Inspection Compliance: Pill count conducted under aseptic conditions, in front of the patient. Neither the pills nor the bottle was removed from the patient's sight at any time. Once count was completed pills were immediately returned to the patient in their original bottle.  Medication: Oxycodone IR Pill/Patch Count: 28 of 120 pills remain Pill/Patch Appearance: Markings consistent with prescribed medication Bottle Appearance: Standard pharmacy container. Clearly labeled. Filled Date: 10 / 28 / 2019 Last Medication intake:  Today

## 2018-01-26 NOTE — Progress Notes (Signed)
Patient's Name: Sharon Lawrence  MRN: [id number]  Referring Provider: Vidal Schwalbe, MD  DOB: 03-Jul-1965  PCP: Vidal Schwalbe, MD  DOS: 01/26/2018  Note by: Vevelyn Francois NP  Service setting: Ambulatory outpatient  Specialty: Interventional Pain Management  Location: [facility] (AMB) Pain Management Facility    Patient type: Established    Primary Reason(s) for Visit: Encounter for prescription drug management. (Level of risk: moderate)  CC: Back Pain  HPI  Sharon Lawrence is a 52 y.o. year old, female patient, who comes today for a medication management evaluation. She has Folic acid deficiency; Iron deficiency; B12 deficiency; Fatty liver disease, nonalcoholic; Vaginal condyloma; Obesity; Family hx of colon cancer; Non-intractable vomiting with nausea; Celiac disease; Acute ST elevation myocardial infarction (STEMI) of inferior wall ([place]); Hyperlipidemia; Tobacco use; Hematochezia; STEMI (ST elevation myocardial infarction) ([place]), 09/15/17; Hypotension; Morbidly obese ([place]); Elevated LFTs; Chronic diarrhea, with Celiac disease; Former smoker; Hypothyroid; History of ST elevation myocardial infarction (STEMI); GI bleed; GERD without esophagitis; Bleeding per rectum; Bilateral lower extremity edema; Benign essential hypertension; Back muscle spasm; Anxiety disorder; Anemia; ADD (attention deficit disorder); Syncope; Sessile colonic polyp; Right-sided low back pain with sciatica; Refused influenza vaccine; RAD (reactive airway disease), moderate persistent, uncomplicated; Other chronic pain; Migraine; Major depressive disorder; Insomnia; IDA (iron deficiency anemia); Chronic low back pain (Primary Area of Pain) (Bilateral) (R>L) w/ sciatica (Right); Chronic lower extremity pain (Secondary Area of Pain) (Right); Chronic sacroiliac joint pain (Bilateral) (L>R); Chronic pain syndrome; Long term current use of opiate analgesic; Pharmacologic therapy; Disorder of skeletal system; Problems influencing health status; Abnormal  MRI, lumbar spine (07/16/2016); DDD (degenerative disc disease), lumbar; DDD (degenerative disc disease), thoracic; Lumbar facet arthropathy (Bilateral); Lumbar facet syndrome (Bilateral); Long term current use of anticoagulant therapy (Plavix); Neurogenic pain; and Chronic musculoskeletal pain on their problem list. Her primarily concern today is the Back Pain  Pain Assessment: Location: Lower Back Radiating: pain radiaties down right leg above knee Onset: More than a month ago Duration: Chronic pain Quality: Discomfort, Burning, Constant, Spasm Severity: 5 /10 (subjective, self-reported pain score)  Note: Reported level is compatible with observation.                          Effect on ADL: unable to do anything when the pain is bad, i miss work at times due to the pain Timing: Constant Modifying factors: heating pad, medications BP: 107/68  HR: 78  Sharon Lawrence was last scheduled for an appointment on 12/03/2017 for medication management. During today's appointment we reviewed Sharon Lawrence's chronic pain status, as well as her outpatient medication regimen.  She denies any concerns.  The patient  reports that she does not use drugs. Her body mass index is 38.08 kg/m.  Further details on both, my assessment(s), as well as the proposed treatment plan, please see below.  Controlled Substance Pharmacotherapy Assessment REMS (Risk Evaluation and Mitigation Strategy)  Analgesic: Oxycodone IR 5 mg every 6 hours (20 mg/day of oxycodone) MME/day:59m/day. BChauncey Fischer RN  01/26/2018  1:45 PM  Sign at close encounter Nursing Pain Medication Assessment:  Safety precautions to be maintained throughout the outpatient stay will include: orient to surroundings, keep bed in low position, maintain call bell within reach at all times, provide assistance with transfer out of bed and ambulation.  Medication Inspection Compliance: Pill count conducted under aseptic conditions, in front of the patient.  Neither the pills nor the bottle was removed from the patient's sight  at any time. Once count was completed pills were immediately returned to the patient in their original bottle.  Medication: Oxycodone IR Pill/Patch Count: 28 of 120 pills remain Pill/Patch Appearance: Markings consistent with prescribed medication Bottle Appearance: Standard pharmacy container. Clearly labeled. Filled Date: 10 / 28 / 2019 Last Medication intake:  Today   Pharmacokinetics: Liberation and absorption (onset of action): WNL Distribution (time to peak effect): WNL Metabolism and excretion (duration of action): WNL         Pharmacodynamics: Desired effects: Analgesia: Sharon Lawrence reports >50% benefit. Functional ability: Patient reports that medication allows her to accomplish basic ADLs Clinically meaningful improvement in function (CMIF): Sustained CMIF goals met Perceived effectiveness: Described as relatively effective, allowing for increase in activities of daily living (ADL) Undesirable effects: Side-effects or Adverse reactions: None reported Monitoring:  PMP: Online review of the past 19-monthperiod conducted. Compliant with practice rules and regulations Last UDS on record: Summary  Date Value Ref Range Status  12/03/2017 FINAL  Final    Comment:    ==================================================================== [facility] COMP DRUG ANALYSIS,UR ==================================================================== Test                             Result       Flag       Units Drug Present and Declared for Prescription Verification   Oxycodone                      1649         EXPECTED   ng/mg creat   Oxymorphone                    1737         EXPECTED   ng/mg creat   Noroxycodone                   629          EXPECTED   ng/mg creat   Noroxymorphone                 220          EXPECTED   ng/mg creat    Sources of oxycodone are scheduled prescription medications.    Oxymorphone,  noroxycodone, and noroxymorphone are expected    metabolites of oxycodone. Oxymorphone is also available as a    scheduled prescription medication.   Gabapentin                     PRESENT      EXPECTED   Trazodone                      PRESENT      EXPECTED   Promethazine                   PRESENT      EXPECTED   Metoprolol                     PRESENT      EXPECTED Drug Present not Declared for Prescription Verification   Acetaminophen                  PRESENT      UNEXPECTED Drug Absent but Declared for Prescription Verification   Clonazepam  Not Detected UNEXPECTED ng/mg creat   Baclofen                       Not Detected UNEXPECTED   Duloxetine                     Not Detected UNEXPECTED   Salicylate                     Not Detected UNEXPECTED    Aspirin, as indicated in the declared medication list, is not    always detected even when used as directed. ==================================================================== Test                      Result    Flag   Units      Ref Range   Creatinine              252              mg/dL      >=20 ==================================================================== Declared Medications:  The flagging and interpretation on this report are based on the  following declared medications.  Unexpected results may arise from  inaccuracies in the declared medications.  **Note: The testing scope of this panel includes these medications:  Baclofen (Lioresal)  Clonazepam (Klonopin)  Duloxetine (Cymbalta)  Gabapentin  Metoprolol  Oxycodone  Promethazine  Trazodone  **Note: The testing scope of this panel does not include small to  moderate amounts of these reported medications:  Aspirin  **Note: The testing scope of this panel does not include following  reported medications:  Atorvastatin (Lipitor)  Clopidogrel (Plavix)  Fluticasone (Flonase)  Hydrochlorothiazide  Levonorgestrel  Nitroglycerin  Nystatin  Ondansetron   Pantoprazole  Torsemide  Vitamin D ==================================================================== For clinical consultation, please call [phone]. ====================================================================    UDS interpretation: Compliant          Medication Assessment Form: Reviewed. Patient indicates being compliant with therapy Treatment compliance: Compliant Risk Assessment Profile: Aberrant behavior: See prior evaluations. None observed or detected today Comorbid factors increasing risk of overdose: See prior notes. No additional risks detected today Opioid risk tool (ORT) (Total Score): 6 Personal History of Substance Abuse (SUD-Substance use disorder):  Alcohol: Negative  Illegal Drugs: Negative  Rx Drugs: Negative  ORT Risk Level calculation: Moderate Risk Risk of substance use disorder (SUD): Moderate Opioid Risk Tool - 01/26/18 1352      Family History of Substance Abuse   Alcohol  Negative    Illegal Drugs  Positive Female    Rx Drugs  Negative      Personal History of Substance Abuse   Alcohol  Negative    Illegal Drugs  Negative    Rx Drugs  Negative      Age   Age between 43-45 years   No      History of Preadolescent Sexual Abuse   History of Preadolescent Sexual Abuse  Negative or Female      Psychological Disease   Psychological Disease  Positive    ADD  Negative    OCD  Negative    Bipolar  Negative    Schizophrenia  Negative    Depression  Positive      Total Score   Opioid Risk Tool Scoring  6    Opioid Risk Interpretation  Moderate Risk      ORT Scoring interpretation table:  Score <3 = Low Risk for SUD  Score between  4-7 = Moderate Risk for SUD  Score >8 = High Risk for Opioid Abuse   Risk Mitigation Strategies:  Patient Counseling: Covered Patient-Prescriber Agreement (PPA): Present and active  Notification to other healthcare providers: Done  Pharmacologic Plan: No change in therapy, at this time.              Laboratory Chemistry  Inflammation Markers (CRP: Acute Phase) (ESR: Chronic Phase) Lab Results  Component Value Date   CRP <0.8 12/03/2017   ESRSEDRATE 10 12/03/2017                         Rheumatology Markers No results found for: RF, ANA, LABURIC, URICUR, LYMEIGGIGMAB, LYMEABIGMQN, HLAB27                      Renal Function Markers Lab Results  Component Value Date   BUN 12 12/31/2017   CREATININE 0.76 12/31/2017   GFRAA >60 12/31/2017   GFRNONAA >60 12/31/2017                             Hepatic Function Markers Lab Results  Component Value Date   AST 29 12/31/2017   ALT 50 (H) 12/31/2017   ALBUMIN 3.4 (L) 12/31/2017   ALKPHOS 97 12/31/2017                        Electrolytes Lab Results  Component Value Date   NA 142 12/31/2017   K 3.5 12/31/2017   CL 106 12/31/2017   CALCIUM 8.4 (L) 12/31/2017   MG 1.9 12/03/2017                        Neuropathy Markers Lab Results  Component Value Date   VITAMINB12 318 02/14/2011   FOLATE 5.7 06/03/2012   HGBA1C 5.6 09/16/2017                        CNS Tests No results found for: COLORCSF, APPEARCSF, RBCCOUNTCSF, WBCCSF, POLYSCSF, LYMPHSCSF, EOSCSF, PROTEINCSF, GLUCCSF, JCVIRUS, CSFOLI, IGGCSF                      Bone Pathology Markers Lab Results  Component Value Date   VD25OH  07/20/2008    37 (NOTE) This assay accurately quantifies Vitamin D, which is the sum of the 25-Hydroxy forms of Vitamin D2 and D3.  Studies have shown that the optimum concentration of 25-Hydroxy Vitamin D is 30 ng/mL or higher.  Concentrations of Vitamin D between 20  and 29 ng/mL are considered to be insufficient and concentrations less than 20 ng/mL are considered to be deficient for Vitamin D.   [id number] 47 04/25/2009   [id number] 47 04/25/2009   [id number]  04/25/2009    <8 (NOTE) Vitamin D3, 1,25(OH)2 indicates both endogenous production and supplementation.  Vitamin D2, 1,25(OH)2 is an indicator of exogeous sources, such as  diet or supplementation.  Interpretation and therapy are based on measurement of Vitamin  D,1,25(OH)2, Total. This test was developed and its performance characteristics have been determined by [facility], [place], [place]. Performance characteristics refer to the analytical performance of the test.                         Coagulation Parameters Lab Results  Component Value Date  INR 1.14 09/25/2017   LABPROT 14.5 09/25/2017   PLT 534 (H) 12/31/2017                        Cardiovascular Markers Lab Results  Component Value Date   TROPONINI <0.03 12/31/2017   HGB 11.8 (L) 12/31/2017   HCT 39.2 12/31/2017                         CA Markers No results found for: CEA, CA125, LABCA2                      Note: Lab results reviewed.  Recent Diagnostic Imaging Results  DG Chest 2 View CLINICAL DATA:  Chest pain.  EXAM: CHEST - 2 VIEW  COMPARISON:  Radiograph September 15, 2017.  FINDINGS: The heart size and mediastinal contours are within normal limits. Both lungs are clear. No pneumothorax or pleural effusion is noted. The visualized skeletal structures are unremarkable.  IMPRESSION: No active cardiopulmonary disease.  Electronically Signed   By: Marijo Conception, M.D.   On: 12/31/2017 19:05  Complexity Note: Imaging results reviewed. Results shared with Sharon Lawrence, using Layman's terms.                         Meds   Current Outpatient Medications:  .  aspirin 81 MG chewable tablet, Chew 1 tablet (81 mg total) by mouth daily., Disp: , Rfl:  .  atorvastatin (LIPITOR) 40 MG tablet, TAKE 1 TABLET BY MOUTH ONCE DAILY AT 6PM., Disp: 30 tablet, Rfl: 6 .  baclofen (LIORESAL) 20 MG tablet, Take 1 tablet (20 mg total) by mouth daily., Disp: 30 tablet, Rfl: 2 .  cholecalciferol (VITAMIN D) 1000 units tablet, Take 5,000 Units by mouth daily., Disp: , Rfl:  .  clonazePAM (KLONOPIN) 0.5 MG tablet, Take 0.5-1 tablets (0.25-0.5 mg total) by mouth as directed. Twice a  day as needed for severe panic sx., Disp: 45 tablet, Rfl: 1 .  clopidogrel (PLAVIX) 75 MG tablet, TAKE 1 TABLET BY MOUTH ONCE DAILY., Disp: 30 tablet, Rfl: 6 .  Cyanocobalamin (VITAMIN B-12 IJ), Inject as directed every 30 (thirty) days. , Disp: , Rfl:  .  DULoxetine (CYMBALTA) 60 MG capsule, Take 1 capsule (60 mg total) by mouth daily., Disp: 90 capsule, Rfl: 0 .  famotidine (PEPCID) 20 MG tablet, , Disp: , Rfl: 3 .  fluticasone (FLONASE) 50 MCG/ACT nasal spray, Place into the nose., Disp: , Rfl:  .  gabapentin (NEURONTIN) 300 MG capsule, Take 3 capsules (900 mg total) by mouth at bedtime., Disp: 90 capsule, Rfl: 2 .  lactobacillus acidophilus (BACID) TABS tablet, Take 1 tablet by mouth daily., Disp: , Rfl:  .  nystatin (MYCOSTATIN/NYSTOP) powder, Apply topically., Disp: , Rfl:  .  ofloxacin (OCUFLOX) 0.3 % ophthalmic solution, , Disp: , Rfl: 0 .  ondansetron (ZOFRAN-ODT) 8 MG disintegrating tablet, Take 1 tablet by mouth 3 (three) times daily as needed for nausea. , Disp: , Rfl: 3 .  [START ON 01/28/2018] oxyCODONE (OXY IR/ROXICODONE) 5 MG immediate release tablet, Take 1 tablet (5 mg total) by mouth every 6 (six) hours as needed for severe pain. Must last 30 days., Disp: 120 tablet, Rfl: 0 .  pantoprazole (PROTONIX) 40 MG tablet, TAKE 1 TABLET BY MOUTH ONCE DAILY BEFORE BREAKFAST., Disp: 30 tablet, Rfl: 6 .  promethazine (PHENERGAN) 25 MG tablet, Take 25 mg  by mouth 3 (three) times daily as needed for nausea. , Disp: , Rfl: 2 .  torsemide (DEMADEX) 20 MG tablet, , Disp: , Rfl: 2 .  traZODone (DESYREL) 100 MG tablet, Take 1-1.5 tablets (100-150 mg total) by mouth at bedtime as needed for sleep., Disp: 45 tablet, Rfl: 1 .  levonorgestrel (MIRENA) 20 MCG/24HR IUD, by Intrauterine route., Disp: , Rfl:  .  metoprolol tartrate (LOPRESSOR) 25 MG tablet, Take 1 tablet (25 mg total) by mouth 2 (two) times daily., Disp: 180 tablet, Rfl: 3 .  nitroGLYCERIN (NITROSTAT) 0.4 MG SL tablet, Place 1 tablet (0.4  mg total) under the tongue every 5 (five) minutes as needed for chest pain., Disp: 25 tablet, Rfl: 2  ROS  Constitutional: Denies any fever or chills Gastrointestinal: No reported hemesis, hematochezia, vomiting, or acute GI distress Musculoskeletal: Denies any acute onset joint swelling, redness, loss of ROM, or weakness Neurological: No reported episodes of acute onset apraxia, aphasia, dysarthria, agnosia, amnesia, paralysis, loss of coordination, or loss of consciousness  Allergies  Sharon Lawrence is allergic to bee venom; erythromycin; cephalexin; neosporin original [bacitracin-neomycin-polymyxin]; prednisone; sulfa antibiotics; tobrex [tobramycin]; tramadol; and penicillins.  PFSH  Drug: Sharon Lawrence  reports that she does not use drugs. Alcohol:  reports that she does not drink alcohol. Tobacco:  reports that she quit smoking about 9 months ago. Her smoking use included cigarettes. She has a 3.00 pack-year smoking history. She has never used smokeless tobacco. Medical:  has a past medical history of ADHD (attention deficit hyperactivity disorder), Anxiety, B12 deficiency (02/07/2011), Back pain, CAD (coronary artery disease), Celiac disease, Folic acid deficiency (02/07/2011), Headache, Iron deficiency anemia (02/07/2011), and Myocardial infarction ([place]) (09/15/2017). Surgical: Ms. Debruyn  has a past surgical history that includes Tonsillectomy; Cholecystectomy; Knee cartilage surgery (Right); LEFT HEART CATH AND CORONARY ANGIOGRAPHY (N/A, 09/15/2017); CORONARY STENT INTERVENTION (Right, 09/15/2017); and Colonoscopy with propofol (N/A, 09/26/2017). Family: family history includes Anxiety disorder in her sister; Bipolar disorder in her son; CAD in her maternal grandfather and paternal grandmother; Cancer in her mother and sister; Cirrhosis in her paternal grandfather; Depression in her sister; Diabetes in her paternal grandmother; Drug abuse in her son; Hypertension in her father and mother; Hypothyroidism in  her father and paternal grandmother.  Constitutional Exam  General appearance: Well nourished, well developed, and well hydrated. In no apparent acute distress Vitals:   01/26/18 1345  BP: 107/68  Pulse: 78  Temp: 98.2 F (36.8 C)  SpO2: 98%  Weight: 195 lb (88.5 kg)  Height: 5' (1.524 m)  Psych/Mental status: Alert, oriented x 3 (person, place, & time)       Eyes: PERLA Respiratory: No evidence of acute respiratory distress   Lumbar Spine Area Exam  Skin & Axial Inspection: No masses, redness, or swelling Alignment: Symmetrical Functional ROM: Unrestricted ROM       Stability: No instability detected Muscle Tone/Strength: Functionally intact. No obvious neuro-muscular anomalies detected. Sensory (Neurological): Unimpaired Palpation: Tender        Gait & Posture Assessment  Ambulation: Unassisted Gait: Relatively normal for age and body habitus Posture: WNL   Lower Extremity Exam    Side: Right lower extremity  Side: Left lower extremity  Stability: No instability observed          Stability: No instability observed          Skin & Extremity Inspection: Skin color, temperature, and hair growth are WNL. No peripheral edema or cyanosis. No masses, redness, swelling, asymmetry, or associated  skin lesions. No contractures.  Skin & Extremity Inspection: Skin color, temperature, and hair growth are WNL. No peripheral edema or cyanosis. No masses, redness, swelling, asymmetry, or associated skin lesions. No contractures.  Functional ROM: Unrestricted ROM                  Functional ROM: Unrestricted ROM                  Muscle Tone/Strength: Functionally intact. No obvious neuro-muscular anomalies detected.  Muscle Tone/Strength: Functionally intact. No obvious neuro-muscular anomalies detected.  Sensory (Neurological): Unimpaired        Sensory (Neurological): Unimpaired        Palpation: No palpable anomalies  Palpation: No palpable anomalies   Assessment  Primary Diagnosis &  Pertinent Problem List: The primary encounter diagnosis was Lumbar facet arthropathy (Bilateral). Diagnoses of Chronic musculoskeletal pain, Neurogenic pain, Back muscle spasm, and Chronic pain syndrome were also pertinent to this visit.  Status Diagnosis  Controlled Controlled Controlled 1. Lumbar facet arthropathy (Bilateral)   2. Chronic musculoskeletal pain   3. Neurogenic pain   4. Back muscle spasm   5. Chronic pain syndrome     Problems updated and reviewed during this visit: No problems updated. Plan of Care  Pharmacotherapy (Medications Ordered): Meds ordered this encounter  Medications  . oxyCODONE (OXY IR/ROXICODONE) 5 MG immediate release tablet    Sig: Take 1 tablet (5 mg total) by mouth every 6 (six) hours as needed for severe pain. Must last 30 days.    Dispense:  120 tablet    Refill:  0    [place] STOP ACT - Not applicable. Fill one day early if pharmacy is closed on scheduled refill date. Must last 30 days.    Order Specific Question:   Supervising Provider    Answer:   Milinda Pointer [phone]  . baclofen (LIORESAL) 20 MG tablet    Sig: Take 1 tablet (20 mg total) by mouth daily.    Dispense:  30 tablet    Refill:  2    Do not place medication on "Automatic Refill". Fill one day early if pharmacy is closed on scheduled refill date.    Order Specific Question:   Supervising Provider    Answer:   Milinda Pointer [[id number]]   New Prescriptions   No medications on file   Medications administered today: Sharon Lawrence had no medications administered during this visit. Lab-work, procedure(s), and/or referral(s): No orders of the defined types were placed in this encounter.  Imaging and/or referral(s): None  Interventional management options: Planned, scheduled, and/or pending:    No blocks until after July 15th, 2020 due to MI and blood thinners.   Considering:   Diagnostic bilateral lumbar facet nerve block  Possible bilateral lumbar facet radiofrequency  ablation  Diagnostic sacroiliac joint nerve block  Possible sacroiliac joint radiofrequency ablation    PRN Procedures:   None at this time    Provider-requested follow-up: Return in about 4 weeks (around 02/23/2018) for MedMgmt.  Future Appointments  Date Time Provider [place] [facility]  02/13/2018 10:30 AM Ursula Alert, MD [facility] None  02/18/2018  2:30 PM Vevelyn Francois, NP [facility] None  10/19/2018  9:30 AM Vaughan Basta, Rona Ravens, NP [facility] None   Primary Care Physician: Vidal Schwalbe, MD Location: [facility] Note by: Vevelyn Francois NP Date: 01/26/2018; Time: 11:39 AM  Pain Score Disclaimer: We use the NRS-11 scale. This is a self-reported, subjective measurement of pain  severity with only modest accuracy. It is used primarily to identify changes within a particular patient. It must be understood that outpatient pain scales are significantly less accurate that those used for research, where they can be applied under ideal controlled circumstances with minimal exposure to variables. In reality, the score is likely to be a combination of pain intensity and pain affect, where pain affect describes the degree of emotional arousal or changes in action readiness caused by the sensory experience of pain. Factors such as social and work situation, setting, emotional state, anxiety levels, expectation, and prior pain experience may influence pain perception and show large inter-individual differences that may also be affected by time variables.  Patient instructions provided during this appointment: Patient Instructions   _You have been given prescription for Oxycodone 44m to last until 02/27/18 and Baclofen to last 04/26/18.  ___________________________________________________________________________________________  Medication Rules  Purpose: To inform patients, and their family members, of our rules and regulations.  Applies to: All patients receiving  prescriptions (written or electronic).  Pharmacy of record: Pharmacy where electronic prescriptions will be sent. If written prescriptions are taken to a different pharmacy, please inform the nursing staff. The pharmacy listed in the electronic medical record should be the one where you would like electronic prescriptions to be sent.  Electronic prescriptions: In compliance with the [place]STOP[place] Act of 2017 (Session [place]anny [phone], effective March 04, 2018, all controlled substances must be electronically prescribed. Calling prescriptions to the pharmacy will cease to exist.  Prescription refills: Only during scheduled appointments. Applies to all prescriptions.  NOTE: The following applies primarily to controlled substances (Opioid* Pain Medications).   Patient's responsibilities: 1. Pain Pills: Bring all pain pills to every appointment (except for procedure appointments). 2. Pill Bottles: Bring pills in original pharmacy bottle. Always bring the newest bottle. Bring bottle, even if empty. 3. Medication refills: You are responsible for knowing and keeping track of what medications you take and those you need refilled. The day before your appointment: write a list of all prescriptions that need to be refilled. The day of the appointment: give the list to the admitting nurse. Prescriptions will be written only during appointments. If you forget a medication: it will not be "Called in", "Faxed", or "electronically sent". You will need to get another appointment to get these prescribed. No early refills. Do not call asking to have your prescription filled early. 4. Prescription Accuracy: You are responsible for carefully inspecting your prescriptions before leaving our office. Have the discharge nurse carefully go over each prescription with you, before taking them home. Make sure that your name is accurately spelled, that your address is correct. Check  the name and dose of your medication to make sure it is accurate. Check the number of pills, and the written instructions to make sure they are clear and accurate. Make sure that you are given enough medication to last until your next medication refill appointment. 5. Taking Medication: Take medication as prescribed. When it comes to controlled substances, taking less pills or less frequently than prescribed is permitted and encouraged. Never take more pills than instructed. Never take medication more frequently than prescribed.  6. Inform other Doctors: Always inform, all of your healthcare providers, of all the medications you take. 7. Pain Medication from other Providers: You are not allowed to accept any additional pain medication from any other Doctor or Healthcare provider. There are two exceptions to this rule. (see below) In the event that  you require additional pain medication, you are responsible for notifying us, as stated below. 8. Medication Agreement: You are responsible for carefully reading and following our Medication Agreement. This must be signed before receiving any prescriptions from our practice. Safely store a copy of your signed Agreement. Violations to the Agreement will result in no further prescriptions. (Additional copies of our Medication Agreement are available upon request.) 9. Laws, Rules, & Regulations: All patients are expected to follow all Federal and [employer], [employer], Rules, [employer]. Ignorance of the Laws does not constitute a valid excuse. The use of any illegal substances is prohibited. 10. Adopted CDC guidelines & recommendations: Target dosing levels will be at or below 60 MME/day. Use of benzodiazepines** is not recommended.  Exceptions: There are only two exceptions to the rule of not receiving pain medications from other Healthcare Providers. 1. Exception #1 (Emergencies): In the event of an emergency (i.e.: accident requiring emergency care), you are  allowed to receive additional pain medication. However, you are responsible for: As soon as you are able, call our office [phone], at any time of the day or night, and leave a message stating your name, the date and nature of the emergency, and the name and dose of the medication prescribed. In the event that your call is answered by a member of our staff, make sure to document and save the date, time, and the name of the person that took your information.  2. Exception #2 (Planned Surgery): In the event that you are scheduled by another doctor or dentist to have any type of surgery or procedure, you are allowed (for a period no longer than 30 days), to receive additional pain medication, for the acute post-op pain. However, in this case, you are responsible for picking up a copy of our "Post-op Pain Management for Surgeons" handout, and giving it to your surgeon or dentist. This document is available at our office, and does not require an appointment to obtain it. Simply go to our office during business hours (Monday-Thursday from 8:00 AM to 4:00 PM) (Friday 8:00 AM to 12:00 Noon) or if you have a scheduled appointment with [place], prior to your surgery, and ask for it by name. In addition, you will need to provide [place] with your name, name of your surgeon, type of surgery, and date of procedure or surgery.  *Opioid medications include: morphine, codeine, oxycodone, oxymorphone, hydrocodone, hydromorphone, meperidine, tramadol, tapentadol, buprenorphine, fentanyl, methadone. **Benzodiazepine medications include: diazepam (Valium), alprazolam (Xanax), clonazepam (Klonopine), lorazepam (Ativan), clorazepate (Tranxene), chlordiazepoxide (Librium), estazolam (Prosom), oxazepam (Serax), temazepam (Restoril), triazolam (Halcion) (Last updated: 05/01/2017) ____________________________________________________________________________________________   BMI Assessment: Estimated body mass index is 38.08 kg/m as  calculated from the following:   Height as of this encounter: 5' (1.524 m).   Weight as of this encounter: 195 lb (88.5 kg).  BMI interpretation table: BMI level Category Range association with higher incidence of chronic pain  <18 kg/m2 Underweight   18.5-24.9 kg/m2 Ideal body weight   25-29.9 kg/m2 Overweight Increased incidence by 20%  30-34.9 kg/m2 Obese (Class I) Increased incidence by 68%  35-39.9 kg/m2 Severe obesity (Class II) Increased incidence by 136%  >40 kg/m2 Extreme obesity (Class III) Increased incidence by 254%   Patient's current BMI Ideal Body weight  Body mass index is 38.08 kg/m. Ideal body weight: 45.5 kg (100 lb 4.9 oz) Adjusted ideal body weight: 62.7 kg (138 lb 3 oz)   BMI Readings from Last 4 Encounters:  01/26/18 38.08 kg/m  01/16/18 35.02 kg/m  12/31/17 37.89 kg/m  12/29/17 38.28 kg/m   Wt Readings from Last 4 Encounters:  01/26/18 195 lb (88.5 kg)  01/16/18 204 lb (92.5 kg)  12/31/17 194 lb (88 kg)  12/29/17 196 lb (88.9 kg)

## 2018-01-26 NOTE — Patient Instructions (Addendum)
_You have been given prescription for Oxycodone 5mg  to last until 02/27/18 and Baclofen to last 04/26/18.  ___________________________________________________________________________________________  Medication Rules  Purpose: To inform patients, and their family members, of our rules and regulations.  Applies to: All patients receiving prescriptions (written or electronic).  Pharmacy of record: Pharmacy where electronic prescriptions will be sent. If written prescriptions are taken to a different pharmacy, please inform the nursing staff. The pharmacy listed in the electronic medical record should be the one where you would like electronic prescriptions to be sent.  Electronic prescriptions: In compliance with the [place] (STOP) Act of 2017 (Session Lanny Cramp [phone]), effective March 04, 2018, all controlled substances must be electronically prescribed. Calling prescriptions to the pharmacy will cease to exist.  Prescription refills: Only during scheduled appointments. Applies to all prescriptions.  NOTE: The following applies primarily to controlled substances (Opioid* Pain Medications).   Patient's responsibilities: 1. Pain Pills: Bring all pain pills to every appointment (except for procedure appointments). 2. Pill Bottles: Bring pills in original pharmacy bottle. Always bring the newest bottle. Bring bottle, even if empty. 3. Medication refills: You are responsible for knowing and keeping track of what medications you take and those you need refilled. The day before your appointment: write a list of all prescriptions that need to be refilled. The day of the appointment: give the list to the admitting nurse. Prescriptions will be written only during appointments. If you forget a medication: it will not be "Called in", "Faxed", or "electronically sent". You will need to get another appointment to get these prescribed. No early refills. Do not call  asking to have your prescription filled early. 4. Prescription Accuracy: You are responsible for carefully inspecting your prescriptions before leaving our office. Have the discharge nurse carefully go over each prescription with you, before taking them home. Make sure that your name is accurately spelled, that your address is correct. Check the name and dose of your medication to make sure it is accurate. Check the number of pills, and the written instructions to make sure they are clear and accurate. Make sure that you are given enough medication to last until your next medication refill appointment. 5. Taking Medication: Take medication as prescribed. When it comes to controlled substances, taking less pills or less frequently than prescribed is permitted and encouraged. Never take more pills than instructed. Never take medication more frequently than prescribed.  6. Inform other Doctors: Always inform, all of your healthcare providers, of all the medications you take. 7. Pain Medication from other Providers: You are not allowed to accept any additional pain medication from any other Doctor or Healthcare provider. There are two exceptions to this rule. (see below) In the event that you require additional pain medication, you are responsible for notifying us, as stated below. 8. Medication Agreement: You are responsible for carefully reading and following our Medication Agreement. This must be signed before receiving any prescriptions from our practice. Safely store a copy of your signed Agreement. Violations to the Agreement will result in no further prescriptions. (Additional copies of our Medication Agreement are available upon request.) 9. Laws, Rules, & Regulations: All patients are expected to follow all Federal and [employer], [employer], Rules, [employer]. Ignorance of the Laws does not constitute a valid excuse. The use of any illegal substances is prohibited. 10. Adopted CDC guidelines &  recommendations: Target dosing levels will be at or below 60 MME/day. Use of benzodiazepines** is not recommended.  Exceptions:  There are only two exceptions to the rule of not receiving pain medications from other Healthcare Providers. 1. Exception #1 (Emergencies): In the event of an emergency (i.e.: accident requiring emergency care), you are allowed to receive additional pain medication. However, you are responsible for: As soon as you are able, call our office [phone], at any time of the day or night, and leave a message stating your name, the date and nature of the emergency, and the name and dose of the medication prescribed. In the event that your call is answered by a member of our staff, make sure to document and save the date, time, and the name of the person that took your information.  2. Exception #2 (Planned Surgery): In the event that you are scheduled by another doctor or dentist to have any type of surgery or procedure, you are allowed (for a period no longer than 30 days), to receive additional pain medication, for the acute post-op pain. However, in this case, you are responsible for picking up a copy of our "Post-op Pain Management for Surgeons" handout, and giving it to your surgeon or dentist. This document is available at our office, and does not require an appointment to obtain it. Simply go to our office during business hours (Monday-Thursday from 8:00 AM to 4:00 PM) (Friday 8:00 AM to 12:00 Noon) or if you have a scheduled appointment with [place], prior to your surgery, and ask for it by name. In addition, you will need to provide [place] with your name, name of your surgeon, type of surgery, and date of procedure or surgery.  *Opioid medications include: morphine, codeine, oxycodone, oxymorphone, hydrocodone, hydromorphone, meperidine, tramadol, tapentadol, buprenorphine, fentanyl, methadone. **Benzodiazepine medications include: diazepam (Valium), alprazolam (Xanax), clonazepam  (Klonopine), lorazepam (Ativan), clorazepate (Tranxene), chlordiazepoxide (Librium), estazolam (Prosom), oxazepam (Serax), temazepam (Restoril), triazolam (Halcion) (Last updated: 05/01/2017) ____________________________________________________________________________________________   BMI Assessment: Estimated body mass index is 38.08 kg/m as calculated from the following:   Height as of this encounter: 5' (1.524 m).   Weight as of this encounter: 195 lb (88.5 kg).  BMI interpretation table: BMI level Category Range association with higher incidence of chronic pain  <18 kg/m2 Underweight   18.5-24.9 kg/m2 Ideal body weight   25-29.9 kg/m2 Overweight Increased incidence by 20%  30-34.9 kg/m2 Obese (Class I) Increased incidence by 68%  35-39.9 kg/m2 Severe obesity (Class II) Increased incidence by 136%  >40 kg/m2 Extreme obesity (Class III) Increased incidence by 254%   Patient's current BMI Ideal Body weight  Body mass index is 38.08 kg/m. Ideal body weight: 45.5 kg (100 lb 4.9 oz) Adjusted ideal body weight: 62.7 kg (138 lb 3 oz)   BMI Readings from Last 4 Encounters:  01/26/18 38.08 kg/m  01/16/18 35.02 kg/m  12/31/17 37.89 kg/m  12/29/17 38.28 kg/m   Wt Readings from Last 4 Encounters:  01/26/18 195 lb (88.5 kg)  01/16/18 204 lb (92.5 kg)  12/31/17 194 lb (88 kg)  12/29/17 196 lb (88.9 kg)

## 2018-02-13 ENCOUNTER — Ambulatory Visit: Payer: 59 | Admitting: Psychiatry

## 2018-02-18 ENCOUNTER — Ambulatory Visit: Payer: 59 | Attending: Nurse Practitioner | Admitting: Nurse Practitioner

## 2018-02-18 ENCOUNTER — Encounter: Payer: Self-pay | Admitting: Nurse Practitioner

## 2018-02-18 VITALS — BP 115/67 | HR 57 | Temp 98.3°F | Resp 16 | Ht 60.0 in | Wt 190.0 lb

## 2018-02-18 DIAGNOSIS — F419 Anxiety disorder, unspecified: Secondary | ICD-10-CM | POA: Insufficient documentation

## 2018-02-18 DIAGNOSIS — E538 Deficiency of other specified B group vitamins: Secondary | ICD-10-CM | POA: Diagnosis not present

## 2018-02-18 DIAGNOSIS — M5441 Lumbago with sciatica, right side: Secondary | ICD-10-CM | POA: Diagnosis not present

## 2018-02-18 DIAGNOSIS — M533 Sacrococcygeal disorders, not elsewhere classified: Secondary | ICD-10-CM | POA: Insufficient documentation

## 2018-02-18 DIAGNOSIS — K760 Fatty (change of) liver, not elsewhere classified: Secondary | ICD-10-CM | POA: Diagnosis not present

## 2018-02-18 DIAGNOSIS — Z7901 Long term (current) use of anticoagulants: Secondary | ICD-10-CM | POA: Diagnosis not present

## 2018-02-18 DIAGNOSIS — G4700 Insomnia, unspecified: Secondary | ICD-10-CM | POA: Diagnosis not present

## 2018-02-18 DIAGNOSIS — E039 Hypothyroidism, unspecified: Secondary | ICD-10-CM | POA: Insufficient documentation

## 2018-02-18 DIAGNOSIS — M545 Low back pain: Secondary | ICD-10-CM | POA: Diagnosis present

## 2018-02-18 DIAGNOSIS — Z7902 Long term (current) use of antithrombotics/antiplatelets: Secondary | ICD-10-CM | POA: Diagnosis not present

## 2018-02-18 DIAGNOSIS — I2510 Atherosclerotic heart disease of native coronary artery without angina pectoris: Secondary | ICD-10-CM | POA: Insufficient documentation

## 2018-02-18 DIAGNOSIS — E611 Iron deficiency: Secondary | ICD-10-CM | POA: Insufficient documentation

## 2018-02-18 DIAGNOSIS — I10 Essential (primary) hypertension: Secondary | ICD-10-CM | POA: Diagnosis not present

## 2018-02-18 DIAGNOSIS — Z975 Presence of (intrauterine) contraceptive device: Secondary | ICD-10-CM | POA: Insufficient documentation

## 2018-02-18 DIAGNOSIS — K219 Gastro-esophageal reflux disease without esophagitis: Secondary | ICD-10-CM | POA: Insufficient documentation

## 2018-02-18 DIAGNOSIS — F988 Other specified behavioral and emotional disorders with onset usually occurring in childhood and adolescence: Secondary | ICD-10-CM | POA: Insufficient documentation

## 2018-02-18 DIAGNOSIS — G894 Chronic pain syndrome: Secondary | ICD-10-CM

## 2018-02-18 DIAGNOSIS — Z6837 Body mass index (BMI) 37.0-37.9, adult: Secondary | ICD-10-CM | POA: Diagnosis not present

## 2018-02-18 DIAGNOSIS — E785 Hyperlipidemia, unspecified: Secondary | ICD-10-CM | POA: Diagnosis not present

## 2018-02-18 DIAGNOSIS — Z7982 Long term (current) use of aspirin: Secondary | ICD-10-CM | POA: Diagnosis not present

## 2018-02-18 DIAGNOSIS — M5136 Other intervertebral disc degeneration, lumbar region: Secondary | ICD-10-CM | POA: Insufficient documentation

## 2018-02-18 DIAGNOSIS — Z7951 Long term (current) use of inhaled steroids: Secondary | ICD-10-CM | POA: Insufficient documentation

## 2018-02-18 DIAGNOSIS — G8929 Other chronic pain: Secondary | ICD-10-CM

## 2018-02-18 DIAGNOSIS — Z79891 Long term (current) use of opiate analgesic: Secondary | ICD-10-CM | POA: Insufficient documentation

## 2018-02-18 DIAGNOSIS — Z79899 Other long term (current) drug therapy: Secondary | ICD-10-CM | POA: Diagnosis not present

## 2018-02-18 DIAGNOSIS — G43909 Migraine, unspecified, not intractable, without status migrainosus: Secondary | ICD-10-CM | POA: Diagnosis not present

## 2018-02-18 DIAGNOSIS — Z955 Presence of coronary angioplasty implant and graft: Secondary | ICD-10-CM | POA: Insufficient documentation

## 2018-02-18 DIAGNOSIS — M51369 Other intervertebral disc degeneration, lumbar region without mention of lumbar back pain or lower extremity pain: Secondary | ICD-10-CM

## 2018-02-18 DIAGNOSIS — I252 Old myocardial infarction: Secondary | ICD-10-CM | POA: Diagnosis not present

## 2018-02-18 DIAGNOSIS — Z87891 Personal history of nicotine dependence: Secondary | ICD-10-CM | POA: Diagnosis not present

## 2018-02-18 MED ORDER — OXYCODONE HCL 5 MG PO TABS: 5.0000 mg | ORAL_TABLET | Freq: Four times a day (QID) | ORAL | 0 refills | Status: DC | PRN

## 2018-02-18 NOTE — Progress Notes (Signed)
Duplicate

## 2018-02-18 NOTE — Patient Instructions (Addendum)
____________________________________________________________________________________________  Medication Rules  Purpose: To inform patients, and their family members, of our rules and regulations.  Applies to: All patients receiving prescriptions (written or electronic).  Pharmacy of record: Pharmacy where electronic prescriptions will be sent. If written prescriptions are taken to a different pharmacy, please inform the nursing staff. The pharmacy listed in the electronic medical record should be the one where you would like electronic prescriptions to be sent.  Electronic prescriptions: In compliance with the [place] Strengthen Opioid Misuse Prevention (STOP) Act of 2017 (Session Law 2017-74/H243), effective March 04, 2018, all controlled substances must be electronically prescribed. Calling prescriptions to the pharmacy will cease to exist.  Prescription refills: Only during scheduled appointments. Applies to all prescriptions.  NOTE: The following applies primarily to controlled substances (Opioid* Pain Medications).   Patient's responsibilities: 1. Pain Pills: Bring all pain pills to every appointment (except for procedure appointments). 2. Pill Bottles: Bring pills in original pharmacy bottle. Always bring the newest bottle. Bring bottle, even if empty. 3. Medication refills: You are responsible for knowing and keeping track of what medications you take and those you need refilled. The day before your appointment: write a list of all prescriptions that need to be refilled. The day of the appointment: give the list to the admitting nurse. Prescriptions will be written only during appointments. If you forget a medication: it will not be "Called in", "Faxed", or "electronically sent". You will need to get another appointment to get these prescribed. No early refills. Do not call asking to have your prescription filled early. 4. Prescription Accuracy: You are responsible for  carefully inspecting your prescriptions before leaving our office. Have the discharge nurse carefully go over each prescription with you, before taking them home. Make sure that your name is accurately spelled, that your address is correct. Check the name and dose of your medication to make sure it is accurate. Check the number of pills, and the written instructions to make sure they are clear and accurate. Make sure that you are given enough medication to last until your next medication refill appointment. 5. Taking Medication: Take medication as prescribed. When it comes to controlled substances, taking less pills or less frequently than prescribed is permitted and encouraged. Never take more pills than instructed. Never take medication more frequently than prescribed.  6. Inform other Doctors: Always inform, all of your healthcare providers, of all the medications you take. 7. Pain Medication from other Providers: You are not allowed to accept any additional pain medication from any other Doctor or Healthcare provider. There are two exceptions to this rule. (see below) In the event that you require additional pain medication, you are responsible for notifying us, as stated below. 8. Medication Agreement: You are responsible for carefully reading and following our Medication Agreement. This must be signed before receiving any prescriptions from our practice. Safely store a copy of your signed Agreement. Violations to the Agreement will result in no further prescriptions. (Additional copies of our Medication Agreement are available upon request.) 9. Laws, Rules, & Regulations: All patients are expected to follow all Federal and State Laws, Statutes, Rules, & Regulations. Ignorance of the Laws does not constitute a valid excuse. The use of any illegal substances is prohibited. 10. Adopted CDC guidelines & recommendations: Target dosing levels will be at or below 60 MME/day. Use of benzodiazepines** is not  recommended.  Exceptions: There are only two exceptions to the rule of not receiving pain medications from other Healthcare Providers. 1.   Exception #1 (Emergencies): In the event of an emergency (i.e.: accident requiring emergency care), you are allowed to receive additional pain medication. However, you are responsible for: As soon as you are able, call our office [phone], at any time of the day or night, and leave a message stating your name, the date and nature of the emergency, and the name and dose of the medication prescribed. In the event that your call is answered by a member of our staff, make sure to document and save the date, time, and the name of the person that took your information.  2. Exception #2 (Planned Surgery): In the event that you are scheduled by another doctor or dentist to have any type of surgery or procedure, you are allowed (for a period no longer than 30 days), to receive additional pain medication, for the acute post-op pain. However, in this case, you are responsible for picking up a copy of our "Post-op Pain Management for Surgeons" handout, and giving it to your surgeon or dentist. This document is available at our office, and does not require an appointment to obtain it. Simply go to our office during business hours (Monday-Thursday from 8:00 AM to 4:00 PM) (Friday 8:00 AM to 12:00 Noon) or if you have a scheduled appointment with [place], prior to your surgery, and ask for it by name. In addition, you will need to provide [place] with your name, name of your surgeon, type of surgery, and date of procedure or surgery.  *Opioid medications include: morphine, codeine, oxycodone, oxymorphone, hydrocodone, hydromorphone, meperidine, tramadol, tapentadol, buprenorphine, fentanyl, methadone. **Benzodiazepine medications include: diazepam (Valium), alprazolam (Xanax), clonazepam (Klonopine), lorazepam (Ativan), clorazepate (Tranxene), chlordiazepoxide (Librium), estazolam (Prosom),  oxazepam (Serax), temazepam (Restoril), triazolam (Halcion) (Last updated: 05/01/2017) ____________________________________________________________________________________________    BMI Assessment: Estimated body mass index is 37.11 kg/m as calculated from the following:   Height as of this encounter: 5' (1.524 m).   Weight as of this encounter: 190 lb (86.2 kg).  BMI interpretation table: BMI level Category Range association with higher incidence of chronic pain  <18 kg/m2 Underweight   18.5-24.9 kg/m2 Ideal body weight   25-29.9 kg/m2 Overweight Increased incidence by 20%  30-34.9 kg/m2 Obese (Class I) Increased incidence by 68%  35-39.9 kg/m2 Severe obesity (Class II) Increased incidence by 136%  >40 kg/m2 Extreme obesity (Class III) Increased incidence by 254%   Patient's current BMI Ideal Body weight  Body mass index is 37.11 kg/m. Ideal body weight: 45.5 kg (100 lb 4.9 oz) Adjusted ideal body weight: 61.8 kg (136 lb 3 oz)   BMI Readings from Last 4 Encounters:  02/18/18 37.11 kg/m  01/26/18 38.08 kg/m  01/16/18 35.02 kg/m  12/31/17 37.89 kg/m   Wt Readings from Last 4 Encounters:  02/18/18 190 lb (86.2 kg)  01/26/18 195 lb (88.5 kg)  01/16/18 204 lb (92.5 kg)  12/31/17 194 lb (88 kg)    Oxycodone hcl 5 mg x 1 month to fill on 02/27/18 escribed to pharmacy

## 2018-02-18 NOTE — Progress Notes (Signed)
Nursing Pain Medication Assessment:  Safety precautions to be maintained throughout the outpatient stay will include: orient to surroundings, keep bed in low position, maintain call bell within reach at all times, provide assistance with transfer out of bed and ambulation.  Medication Inspection Compliance: Pill count conducted under aseptic conditions, in front of the patient. Neither the pills nor the bottle was removed from the patient's sight at any time. Once count was completed pills were immediately returned to the patient in their original bottle.  Medication: Oxycodone IR Pill/Patch Count: 51 of 120 pills remain Pill/Patch Appearance: Markings consistent with prescribed medication Bottle Appearance: Standard pharmacy container. Clearly labeled. Filled Date: 2 / 27 / 2019 Last Medication intake:  Today

## 2018-02-18 NOTE — Progress Notes (Signed)
Patient's Name: Sharon Lawrence  MRN: [id number]  Referring Provider: Vidal Schwalbe, MD  DOB: [birth year]  PCP: Vidal Schwalbe, MD  DOS: 02/18/2018  Note by: Vevelyn Francois NP  Service setting: Ambulatory outpatient  Specialty: Interventional Pain Management  Location: [facility] (AMB) Pain Management Facility    Patient type: Established    Primary Reason(s) for Visit: Encounter for prescription drug management. (Level of risk: moderate)  CC: Back Pain (lower right is worse )  HPI  Sharon Lawrence is a 52 y.o. year old, female patient, who comes today for a medication management evaluation. She has Folic acid deficiency; Iron deficiency; B12 deficiency; Fatty liver disease, nonalcoholic; Vaginal condyloma; Obesity; Family hx of colon cancer; Non-intractable vomiting with nausea; Celiac disease; Acute ST elevation myocardial infarction (STEMI) of inferior wall ([place]); Hyperlipidemia; Hematochezia; STEMI (ST elevation myocardial infarction) ([place]), 09/15/17; Hypotension; Morbidly obese ([place]); Elevated LFTs; Chronic diarrhea, with Celiac disease; Former smoker; Hypothyroid; History of ST elevation myocardial infarction (STEMI); GI bleed; GERD without esophagitis; Bleeding per rectum; Bilateral lower extremity edema; Benign essential hypertension; Back muscle spasm; Anxiety disorder; Anemia; ADD (attention deficit disorder); Syncope; Sessile colonic polyp; Right-sided low back pain with sciatica; Refused influenza vaccine; RAD (reactive airway disease), moderate persistent, uncomplicated; Other chronic pain; Migraine; Major depressive disorder; Insomnia; IDA (iron deficiency anemia); Chronic low back pain (Primary Area of Pain) (Bilateral) (R>L) w/ sciatica (Right); Chronic lower extremity pain (Secondary Area of Pain) (Right); Chronic sacroiliac joint pain (Bilateral) (L>R); Chronic pain syndrome; Long term current use of opiate analgesic; Pharmacologic therapy; Disorder of skeletal system; Problems influencing health status;  Abnormal MRI, lumbar spine (07/16/2016); DDD (degenerative disc disease), lumbar; DDD (degenerative disc disease), thoracic; Lumbar facet arthropathy (Bilateral); Lumbar facet syndrome (Bilateral); Long term current use of anticoagulant therapy (Plavix); Neurogenic pain; and Chronic musculoskeletal pain on their problem list. Her primarily concern today is the Back Pain (lower right is worse )  Pain Assessment: Location: Lower, Right Back Radiating: doing down right leg  Onset: More than a month ago Duration: Chronic pain Quality: Discomfort, Constant, Burning, Spasm Severity: 4 /10 (subjective, self-reported pain score)  Note: Reported level is compatible with observation.                          Effect on ADL: has to miss work sometimes d/t pain and unable to do much when pain is bad.  Timing: Constant Modifying factors: heating pad and medications  BP: 115/67  HR: (!) 57  Sharon Lawrence was last scheduled for an appointment on 01/26/2018 for medication management. During today's appointment we reviewed Sharon Lawrence's chronic pain status, as well as her outpatient medication regimen. She admits that the pain goes in to the back of her knee. She has occasional numbness and tingling with occasional weakness.   The patient  reports no history of drug use. Her body mass index is 37.11 kg/m.  Further details on both, my assessment(s), as well as the proposed treatment plan, please see below.  Controlled Substance Pharmacotherapy Assessment REMS (Risk Evaluation and Mitigation Strategy)  Analgesic:Oxycodone IR 5 mg every 6 hours (20 mg/day of oxycodone) MME/day:75m/day. PJanett Billow RN  02/18/2018  2:36 PM  Sign when Signing Visit Nursing Pain Medication Assessment:  Safety precautions to be maintained throughout the outpatient stay will include: orient to surroundings, keep bed in low position, maintain call bell within reach at all times, provide assistance with transfer out of bed and  ambulation.  Medication  Inspection Compliance: Pill count conducted under aseptic conditions, in front of the patient. Neither the pills nor the bottle was removed from the patient's sight at any time. Once count was completed pills were immediately returned to the patient in their original bottle.  Medication: Oxycodone IR Pill/Patch Count: 51 of 120 pills remain Pill/Patch Appearance: Markings consistent with prescribed medication Bottle Appearance: Standard pharmacy container. Clearly labeled. Filled Date: 51 / 27 / 2019 Last Medication intake:  Today   Pharmacokinetics: Liberation and absorption (onset of action): WNL Distribution (time to peak effect): WNL Metabolism and excretion (duration of action): WNL         Pharmacodynamics: Desired effects: Analgesia: Sharon Lawrence reports >50% benefit. Functional ability: Patient reports that medication allows her to accomplish basic ADLs Clinically meaningful improvement in function (CMIF): Sustained CMIF goals met Perceived effectiveness: Described as relatively effective, allowing for increase in activities of daily living (ADL) Undesirable effects: Side-effects or Adverse reactions: None reported Monitoring: [place] PMP: Online review of the past 78-monthperiod conducted. Compliant with practice rules and regulations Last UDS on record: Summary  Date Value Ref Range Status  12/03/2017 FINAL  Final    Comment:    ==================================================================== [facility] COMP DRUG ANALYSIS,UR ==================================================================== Test                             Result       Flag       Units Drug Present and Declared for Prescription Verification   Oxycodone                      1649         EXPECTED   ng/mg creat   Oxymorphone                    1737         EXPECTED   ng/mg creat   Noroxycodone                   629          EXPECTED   ng/mg creat   Noroxymorphone                 220           EXPECTED   ng/mg creat    Sources of oxycodone are scheduled prescription medications.    Oxymorphone, noroxycodone, and noroxymorphone are expected    metabolites of oxycodone. Oxymorphone is also available as a    scheduled prescription medication.   Gabapentin                     PRESENT      EXPECTED   Trazodone                      PRESENT      EXPECTED   Promethazine                   PRESENT      EXPECTED   Metoprolol                     PRESENT      EXPECTED Drug Present not Declared for Prescription Verification   Acetaminophen                  PRESENT      UNEXPECTED Drug Absent but  Declared for Prescription Verification   Clonazepam                     Not Detected UNEXPECTED ng/mg creat   Baclofen                       Not Detected UNEXPECTED   Duloxetine                     Not Detected UNEXPECTED   Salicylate                     Not Detected UNEXPECTED    Aspirin, as indicated in the declared medication list, is not    always detected even when used as directed. ==================================================================== Test                      Result    Flag   Units      Ref Range   Creatinine              252              mg/dL      >=20 ==================================================================== Declared Medications:  The flagging and interpretation on this report are based on the  following declared medications.  Unexpected results may arise from  inaccuracies in the declared medications.  **Note: The testing scope of this panel includes these medications:  Baclofen (Lioresal)  Clonazepam (Klonopin)  Duloxetine (Cymbalta)  Gabapentin  Metoprolol  Oxycodone  Promethazine  Trazodone  **Note: The testing scope of this panel does not include small to  moderate amounts of these reported medications:  Aspirin  **Note: The testing scope of this panel does not include following  reported medications:  Atorvastatin (Lipitor)  Clopidogrel  (Plavix)  Fluticasone (Flonase)  Hydrochlorothiazide  Levonorgestrel  Nitroglycerin  Nystatin  Ondansetron  Pantoprazole  Torsemide  Vitamin D ==================================================================== For clinical consultation, please call [phone]. ====================================================================    UDS interpretation: Compliant          Medication Assessment Form: Reviewed. Patient indicates being compliant with therapy Treatment compliance: Compliant Risk Assessment Profile: Aberrant behavior: See prior evaluations. None observed or detected today Comorbid factors increasing risk of overdose: See prior notes. No additional risks detected today Opioid risk tool (ORT) (Total Score):   Personal History of Substance Abuse (SUD-Substance use disorder):  Alcohol:    Illegal Drugs:    Rx Drugs:    ORT Risk Level calculation:   Risk of substance use disorder (SUD): Low  ORT Scoring interpretation table:  Score <3 = Low Risk for SUD  Score between 4-7 = Moderate Risk for SUD  Score >8 = High Risk for Opioid Abuse   Risk Mitigation Strategies:  Patient Counseling: Covered Patient-Prescriber Agreement (PPA): Present and active  Notification to other healthcare providers: Done  Pharmacologic Plan: No change in therapy, at this time.             Laboratory Chemistry  Inflammation Markers (CRP: Acute Phase) (ESR: Chronic Phase) Lab Results  Component Value Date   CRP <0.8 12/03/2017   ESRSEDRATE 10 12/03/2017                         Rheumatology Markers No results found for: RF, ANA, [place], [place], [place], [place], HLAB27  Renal Function Markers Lab Results  Component Value Date   BUN 12 12/31/2017   CREATININE 0.76 12/31/2017   GFRAA >60 12/31/2017   GFRNONAA >60 12/31/2017                             Hepatic Function Markers Lab Results  Component Value Date   AST 29 12/31/2017   ALT 50 (H)  12/31/2017   ALBUMIN 3.4 (L) 12/31/2017   ALKPHOS 97 12/31/2017                        Electrolytes Lab Results  Component Value Date   NA 142 12/31/2017   K 3.5 12/31/2017   CL 106 12/31/2017   CALCIUM 8.4 (L) 12/31/2017   MG 1.9 12/03/2017                        Neuropathy Markers Lab Results  Component Value Date   VITAMINB12 318 02/14/2011   FOLATE 5.7 06/03/2012   HGBA1C 5.6 09/16/2017                        CNS Tests No results found for: COLORCSF, APPEARCSF, RBCCOUNTCSF, WBCCSF, POLYSCSF, LYMPHSCSF, EOSCSF, PROTEINCSF, GLUCCSF, JCVIRUS, CSFOLI, IGGCSF                      Bone Pathology Markers Lab Results  Component Value Date   VD25OH  07/20/2008    37 (NOTE) This assay accurately quantifies Vitamin D, which is the sum of the 25-Hydroxy forms of Vitamin D2 and D3.  Studies have shown that the optimum concentration of 25-Hydroxy Vitamin D is 30 ng/mL or higher.  Concentrations of Vitamin D between 20  and 29 ng/mL are considered to be insufficient and concentrations less than 20 ng/mL are considered to be deficient for Vitamin D.   [id number] 47 04/25/2009   [id number] 47 04/25/2009   [id number]  04/25/2009    <8 (NOTE) Vitamin D3, 1,25(OH)2 indicates both endogenous production and supplementation.  Vitamin D2, 1,25(OH)2 is an indicator of exogeous sources, such as diet or supplementation.  Interpretation and therapy are based on measurement of Vitamin  D,1,25(OH)2, Total. This test was developed and its performance characteristics have been determined by [facility], [place], [place]. Performance characteristics refer to the analytical performance of the test.                         Coagulation Parameters Lab Results  Component Value Date   INR 1.14 09/25/2017   LABPROT 14.5 09/25/2017   PLT 534 (H) 12/31/2017                        Cardiovascular Markers Lab Results  Component Value Date   TROPONINI <0.03 12/31/2017   HGB 11.8 (L)  12/31/2017   HCT 39.2 12/31/2017                         CA Markers No results found for: CEA, CA125, LABCA2                      Note: Lab results reviewed.  Recent Diagnostic Imaging Results  DG Chest 2 View CLINICAL DATA:  Chest pain.  EXAM: CHEST - 2 VIEW  COMPARISON:  Radiograph September 15, 2017.  FINDINGS: The heart size and mediastinal contours are within normal limits. Both lungs are clear. No pneumothorax or pleural effusion is noted. The visualized skeletal structures are unremarkable.  IMPRESSION: No active cardiopulmonary disease.  Electronically Signed   By: Marijo Conception, M.D.   On: 12/31/2017 19:05  Complexity Note: Imaging results reviewed. Results shared with Ms. Paulick, using Layman's terms.                         Meds   Current Outpatient Medications:  .  aspirin 81 MG chewable tablet, Chew 1 tablet (81 mg total) by mouth daily., Disp: , Rfl:  .  atorvastatin (LIPITOR) 40 MG tablet, TAKE 1 TABLET BY MOUTH ONCE DAILY AT 6PM., Disp: 30 tablet, Rfl: 6 .  baclofen (LIORESAL) 20 MG tablet, Take 1 tablet (20 mg total) by mouth daily., Disp: 30 tablet, Rfl: 2 .  cholecalciferol (VITAMIN D) 1000 units tablet, Take 5,000 Units by mouth daily., Disp: , Rfl:  .  clonazePAM (KLONOPIN) 0.5 MG tablet, Take 0.5-1 tablets (0.25-0.5 mg total) by mouth as directed. Twice a day as needed for severe panic sx., Disp: 45 tablet, Rfl: 1 .  clopidogrel (PLAVIX) 75 MG tablet, TAKE 1 TABLET BY MOUTH ONCE DAILY., Disp: 30 tablet, Rfl: 6 .  Cyanocobalamin (VITAMIN B-12 IJ), Inject as directed every 30 (thirty) days. , Disp: , Rfl:  .  DULoxetine (CYMBALTA) 60 MG capsule, Take 1 capsule (60 mg total) by mouth daily., Disp: 90 capsule, Rfl: 0 .  famotidine (PEPCID) 20 MG tablet, , Disp: , Rfl: 3 .  fluticasone (FLONASE) 50 MCG/ACT nasal spray, Place into the nose., Disp: , Rfl:  .  gabapentin (NEURONTIN) 300 MG capsule, Take 3 capsules (900 mg total) by mouth at bedtime., Disp: 90  capsule, Rfl: 2 .  lactobacillus acidophilus (BACID) TABS tablet, Take 1 tablet by mouth daily., Disp: , Rfl:  .  levonorgestrel (MIRENA) 20 MCG/24HR IUD, by Intrauterine route., Disp: , Rfl:  .  metoprolol tartrate (LOPRESSOR) 25 MG tablet, Take 1 tablet (25 mg total) by mouth 2 (two) times daily., Disp: 180 tablet, Rfl: 3 .  nitroGLYCERIN (NITROSTAT) 0.4 MG SL tablet, Place 1 tablet (0.4 mg total) under the tongue every 5 (five) minutes as needed for chest pain., Disp: 25 tablet, Rfl: 2 .  nystatin (MYCOSTATIN/NYSTOP) powder, Apply topically., Disp: , Rfl:  .  ondansetron (ZOFRAN-ODT) 8 MG disintegrating tablet, Take 1 tablet by mouth 3 (three) times daily as needed for nausea. , Disp: , Rfl: 3 .  [START ON 02/27/2018] oxyCODONE (OXY IR/ROXICODONE) 5 MG immediate release tablet, Take 1 tablet (5 mg total) by mouth every 6 (six) hours as needed for severe pain. Must last 30 days., Disp: 120 tablet, Rfl: 0 .  pantoprazole (PROTONIX) 40 MG tablet, TAKE 1 TABLET BY MOUTH ONCE DAILY BEFORE BREAKFAST., Disp: 30 tablet, Rfl: 6 .  promethazine (PHENERGAN) 25 MG tablet, Take 25 mg by mouth 3 (three) times daily as needed for nausea. , Disp: , Rfl: 2 .  torsemide (DEMADEX) 20 MG tablet, , Disp: , Rfl: 2 .  traZODone (DESYREL) 100 MG tablet, Take 1-1.5 tablets (100-150 mg total) by mouth at bedtime as needed for sleep., Disp: 45 tablet, Rfl: 1  ROS  Constitutional: Denies any fever or chills Gastrointestinal: No reported hemesis, hematochezia, vomiting, or acute GI distress Musculoskeletal: Denies any acute onset joint swelling, redness, loss of ROM, or weakness Neurological:  No reported episodes of acute onset apraxia, aphasia, dysarthria, agnosia, amnesia, paralysis, loss of coordination, or loss of consciousness  Allergies  Ms. Borg is allergic to bee venom; erythromycin; cephalexin; neosporin original [bacitracin-neomycin-polymyxin]; prednisone; sulfa antibiotics; tobrex [tobramycin]; tramadol; and  penicillins.  PFSH  Drug: Ms. Hammett  reports no history of drug use. Alcohol:  reports no history of alcohol use. Tobacco:  reports that she quit smoking about 10 months ago. Her smoking use included cigarettes. She has a 3.00 pack-year smoking history. She has never used smokeless tobacco. Medical:  has a past medical history of ADHD (attention deficit hyperactivity disorder), Anxiety, B12 deficiency (02/07/2011), Back pain, CAD (coronary artery disease), Celiac disease, Folic acid deficiency (02/07/2011), Headache, Iron deficiency anemia (02/07/2011), and Myocardial infarction ([place]) (09/15/2017). Surgical: Ms. Kerrigan  has a past surgical history that includes Tonsillectomy; Cholecystectomy; Knee cartilage surgery (Right); LEFT HEART CATH AND CORONARY ANGIOGRAPHY (N/A, 09/15/2017); CORONARY STENT INTERVENTION (Right, 09/15/2017); and Colonoscopy with propofol (N/A, 09/26/2017). Family: family history includes Anxiety disorder in her sister; Bipolar disorder in her son; CAD in her maternal grandfather and paternal grandmother; Cancer in her mother and sister; Cirrhosis in her paternal grandfather; Depression in her sister; Diabetes in her paternal grandmother; Drug abuse in her son; Hypertension in her father and mother; Hypothyroidism in her father and paternal grandmother.  Constitutional Exam  General appearance: Well nourished, well developed, and well hydrated. In no apparent acute distress Vitals:   02/18/18 1428  BP: 115/67  Pulse: (!) 57  Resp: 16  Temp: 98.3 F (36.8 C)  TempSrc: Oral  SpO2: 99%  Weight: 190 lb (86.2 kg)  Height: 5' (1.524 m)  Psych/Mental status: Alert, oriented x 3 (person, place, & time)       Eyes: PERLA Respiratory: No evidence of acute respiratory distress  Lumbar Spine Area Exam  Skin & Axial Inspection: No masses, redness, or swelling Alignment: Symmetrical Functional ROM: Unrestricted ROM       Stability: No instability detected Muscle Tone/Strength:  Functionally intact. No obvious neuro-muscular anomalies detected. Sensory (Neurological): Unimpaired Palpation: No palpable anomalies         Gait & Posture Assessment  Ambulation: Unassisted Gait: Relatively normal for age and body habitus Posture: WNL   Lower Extremity Exam    Side: Right lower extremity  Side: Left lower extremity  Stability: No instability observed          Stability: No instability observed          Skin & Extremity Inspection: Skin color, temperature, and hair growth are WNL. No peripheral edema or cyanosis. No masses, redness, swelling, asymmetry, or associated skin lesions. No contractures.  Skin & Extremity Inspection: Skin color, temperature, and hair growth are WNL. No peripheral edema or cyanosis. No masses, redness, swelling, asymmetry, or associated skin lesions. No contractures.  Functional ROM: Unrestricted ROM                  Functional ROM: Unrestricted ROM                  Muscle Tone/Strength: Functionally intact. No obvious neuro-muscular anomalies detected.  Muscle Tone/Strength: Functionally intact. No obvious neuro-muscular anomalies detected.  Sensory (Neurological): Unimpaired        Sensory (Neurological): Unimpaired            Palpation: No palpable anomalies  Palpation: No palpable anomalies   Assessment  Primary Diagnosis & Pertinent Problem List: The primary encounter diagnosis was DDD (degenerative disc disease), lumbar.  Diagnoses of Chronic low back pain (Primary Area of Pain) (Bilateral) (R>L) w/ sciatica (Right), Chronic sacroiliac joint pain (Bilateral) (L>R), and Chronic pain syndrome were also pertinent to this visit.  Status Diagnosis  Controlled Controlled Controlled 1. DDD (degenerative disc disease), lumbar   2. Chronic low back pain (Primary Area of Pain) (Bilateral) (R>L) w/ sciatica (Right)   3. Chronic sacroiliac joint pain (Bilateral) (L>R)   4. Chronic pain syndrome     Problems updated and reviewed during this  visit: Problem  Tobacco Use (Resolved)   Plan of Care  Pharmacotherapy (Medications Ordered): Meds ordered this encounter  Medications  . oxyCODONE (OXY IR/ROXICODONE) 5 MG immediate release tablet    Sig: Take 1 tablet (5 mg total) by mouth every 6 (six) hours as needed for severe pain. Must last 30 days.    Dispense:  120 tablet    Refill:  0    [place] STOP ACT - Not applicable. Fill one day early if pharmacy is closed on scheduled refill date. Must last 30 days.    Order Specific Question:   Supervising Provider    Answer:   Milinda Pointer [[id number]]   New Prescriptions   No medications on file   Medications administered today: Zakkiyya K. Kight had no medications administered during this visit. Lab-work, procedure(s), and/or referral(s): No orders of the defined types were placed in this encounter.  Imaging and/or referral(s): None  Interventional management options: Planned, scheduled, and/or pending: No blocks until after July 15th, 2020due to MI and blood thinners.   Considering: Diagnostic bilateral lumbar facet nerve block Possible bilateral lumbar facet radiofrequency ablation Diagnostic sacroiliac joint nerve block Possible sacroiliac joint radiofrequency ablation   PRN Procedures: None at this time    Provider-requested follow-up: Return in about 4 weeks (around 03/18/2018) for MedMgmt.  Future Appointments  Date Time Provider [place]  03/16/2018  1:45 PM Vevelyn Francois, NP [facility] None  10/19/2018  9:30 AM Vaughan Basta, Rona Ravens, NP [facility] None   Primary Care Physician: Vidal Schwalbe, MD Location: [facility] Note by: Vevelyn Francois NP Date: 02/18/2018; Time: 3:30 PM  Pain Score Disclaimer: We use the NRS-11 scale. This is a self-reported, subjective measurement of pain severity with only modest accuracy. It is used primarily to identify changes within a particular patient. It must be understood that  outpatient pain scales are significantly less accurate that those used for research, where they can be applied under ideal controlled circumstances with minimal exposure to variables. In reality, the score is likely to be a combination of pain intensity and pain affect, where pain affect describes the degree of emotional arousal or changes in action readiness caused by the sensory experience of pain. Factors such as social and work situation, setting, emotional state, anxiety levels, expectation, and prior pain experience may influence pain perception and show large inter-individual differences that may also be affected by time variables.  Patient instructions provided during this appointment: Patient Instructions   ____________________________________________________________________________________________  Medication Rules  Purpose: To inform patients, and their family members, of our rules and regulations.  Applies to: All patients receiving prescriptions (written or electronic).  Pharmacy of record: Pharmacy where electronic prescriptions will be sent. If written prescriptions are taken to a different pharmacy, please inform the nursing staff. The pharmacy listed in the electronic medical record should be the one where you would like electronic prescriptions to be sent.  Electronic prescriptions: In compliance with the [place]  Prevention (STOP) Act of 2017 (Session Law 2017-74/H243), effective March 04, 2018, all controlled substances must be electronically prescribed. Calling prescriptions to the pharmacy will cease to exist.  Prescription refills: Only during scheduled appointments. Applies to all prescriptions.  NOTE: The following applies primarily to controlled substances (Opioid* Pain Medications).   Patient's responsibilities: 1. Pain Pills: Bring all pain pills to every appointment (except for procedure appointments). 2. Pill Bottles: Bring pills in  original pharmacy bottle. Always bring the newest bottle. Bring bottle, even if empty. 3. Medication refills: You are responsible for knowing and keeping track of what medications you take and those you need refilled. The day before your appointment: write a list of all prescriptions that need to be refilled. The day of the appointment: give the list to the admitting nurse. Prescriptions will be written only during appointments. If you forget a medication: it will not be "Called in", "Faxed", or "electronically sent". You will need to get another appointment to get these prescribed. No early refills. Do not call asking to have your prescription filled early. 4. Prescription Accuracy: You are responsible for carefully inspecting your prescriptions before leaving our office. Have the discharge nurse carefully go over each prescription with you, before taking them home. Make sure that your name is accurately spelled, that your address is correct. Check the name and dose of your medication to make sure it is accurate. Check the number of pills, and the written instructions to make sure they are clear and accurate. Make sure that you are given enough medication to last until your next medication refill appointment. 5. Taking Medication: Take medication as prescribed. When it comes to controlled substances, taking less pills or less frequently than prescribed is permitted and encouraged. Never take more pills than instructed. Never take medication more frequently than prescribed.  6. Inform other Doctors: Always inform, all of your healthcare providers, of all the medications you take. 7. Pain Medication from other Providers: You are not allowed to accept any additional pain medication from any other Doctor or Healthcare provider. There are two exceptions to this rule. (see below) In the event that you require additional pain medication, you are responsible for notifying us, as stated below. 8. Medication  Agreement: You are responsible for carefully reading and following our Medication Agreement. This must be signed before receiving any prescriptions from our practice. Safely store a copy of your signed Agreement. Violations to the Agreement will result in no further prescriptions. (Additional copies of our Medication Agreement are available upon request.) 9. Laws, Rules, & Regulations: All patients are expected to follow all Federal and [employer], [employer], Rules, [facility]. Ignorance of the Laws does not constitute a valid excuse. The use of any illegal substances is prohibited. 10. Adopted CDC guidelines & recommendations: Target dosing levels will be at or below 60 MME/day. Use of benzodiazepines** is not recommended.  Exceptions: There are only two exceptions to the rule of not receiving pain medications from other Healthcare Providers. 1. Exception #1 (Emergencies): In the event of an emergency (i.e.: accident requiring emergency care), you are allowed to receive additional pain medication. However, you are responsible for: As soon as you are able, call our office [phone], at any time of the day or night, and leave a message stating your name, the date and nature of the emergency, and the name and dose of the medication prescribed. In the event that your call is answered by a member of our staff, make sure to  document and save the date, time, and the name of the person that took your information.  2. Exception #2 (Planned Surgery): In the event that you are scheduled by another doctor or dentist to have any type of surgery or procedure, you are allowed (for a period no longer than 30 days), to receive additional pain medication, for the acute post-op pain. However, in this case, you are responsible for picking up a copy of our "Post-op Pain Management for Surgeons" handout, and giving it to your surgeon or dentist. This document is available at our office, and does not require an appointment  to obtain it. Simply go to our office during business hours (Monday-Thursday from 8:00 AM to 4:00 PM) (Friday 8:00 AM to 12:00 Noon) or if you have a scheduled appointment with [place], prior to your surgery, and ask for it by name. In addition, you will need to provide [place] with your name, name of your surgeon, type of surgery, and date of procedure or surgery.  *Opioid medications include: morphine, codeine, oxycodone, oxymorphone, hydrocodone, hydromorphone, meperidine, tramadol, tapentadol, buprenorphine, fentanyl, methadone. **Benzodiazepine medications include: diazepam (Valium), alprazolam (Xanax), clonazepam (Klonopine), lorazepam (Ativan), clorazepate (Tranxene), chlordiazepoxide (Librium), estazolam (Prosom), oxazepam (Serax), temazepam (Restoril), triazolam (Halcion) (Last updated: 05/01/2017) ____________________________________________________________________________________________    BMI Assessment: Estimated body mass index is 37.11 kg/m as calculated from the following:   Height as of this encounter: 5' (1.524 m).   Weight as of this encounter: 190 lb (86.2 kg).  BMI interpretation table: BMI level Category Range association with higher incidence of chronic pain  <18 kg/m2 Underweight   18.5-24.9 kg/m2 Ideal body weight   25-29.9 kg/m2 Overweight Increased incidence by 20%  30-34.9 kg/m2 Obese (Class I) Increased incidence by 68%  35-39.9 kg/m2 Severe obesity (Class II) Increased incidence by 136%  >40 kg/m2 Extreme obesity (Class III) Increased incidence by 254%   Patient's current BMI Ideal Body weight  Body mass index is 37.11 kg/m. Ideal body weight: 45.5 kg (100 lb 4.9 oz) Adjusted ideal body weight: 61.8 kg (136 lb 3 oz)   BMI Readings from Last 4 Encounters:  02/18/18 37.11 kg/m  01/26/18 38.08 kg/m  01/16/18 35.02 kg/m  12/31/17 37.89 kg/m   Wt Readings from Last 4 Encounters:  02/18/18 190 lb (86.2 kg)  01/26/18 195 lb (88.5 kg)  01/16/18 204 lb (92.5 kg)   12/31/17 194 lb (88 kg)    Oxycodone hcl 5 mg x 1 month to fill on 02/27/18 escribed to pharmacy

## 2018-03-10 ENCOUNTER — Encounter: Payer: Self-pay | Admitting: Psychiatry

## 2018-03-10 ENCOUNTER — Ambulatory Visit: Payer: 59 | Admitting: Psychiatry

## 2018-03-10 ENCOUNTER — Other Ambulatory Visit: Payer: Self-pay

## 2018-03-10 VITALS — BP 107/73 | HR 82 | Temp 98.7°F | Wt 211.8 lb

## 2018-03-10 DIAGNOSIS — F411 Generalized anxiety disorder: Secondary | ICD-10-CM | POA: Diagnosis not present

## 2018-03-10 DIAGNOSIS — F410 Panic disorder [episodic paroxysmal anxiety] without agoraphobia: Secondary | ICD-10-CM

## 2018-03-10 DIAGNOSIS — F330 Major depressive disorder, recurrent, mild: Secondary | ICD-10-CM | POA: Diagnosis not present

## 2018-03-10 DIAGNOSIS — F5105 Insomnia due to other mental disorder: Secondary | ICD-10-CM | POA: Diagnosis not present

## 2018-03-10 MED ORDER — MIRTAZAPINE 7.5 MG PO TABS: 7.5000 mg | ORAL_TABLET | Freq: Every day | ORAL | 1 refills | Status: DC

## 2018-03-10 NOTE — Progress Notes (Signed)
[facility] OP Progress Note  03/10/2018 5:29 PM Sharon Lawrence  MRN:  [id number]  Chief Complaint: ' I am here for follow up." Chief Complaint    Follow-up; Medication Refill     HPI: Sharon Lawrence is a 53 yr old Caucasian female who is widowed, employed as a Marine scientist, lives in [place], has a history of coronary artery disease status post STEMI on 09/15/2017, status post stent placement, hyperlipidemia, chronic anemia, presented to the clinic today for a follow-up visit.  Patient today reports she had an adverse reaction to trazodone.  She reports she had excessive sweating and her blood pressure was also high.  She was told she may likely have had serotonin syndrome.  She reports she stopped taking the trazodone and she felt better.  She continues to struggle with sleep and is interested in a sleep medication.  Discussed mirtazapine.  Patient continues to be compliant with her Cymbalta as prescribed.  She reports her mood symptoms as making progress on the current medication regimen.  She continues to have psychosocial stressors of having to deal with her own health problems as well as her son who has a substance abuse problem.  Patient will continue psychotherapy sessions.  Patient denies any suicidality, homicidality or perceptual disturbances.   Visit Diagnosis:    ICD-10-CM   1. Panic disorder F41.0 mirtazapine (REMERON) 7.5 MG tablet  2. GAD (generalized anxiety disorder) F41.1   3. MDD (major depressive disorder), recurrent episode, mild ([place]) F33.0   4. Insomnia due to mental disorder F51.05     Past Psychiatric History: Reviewed past psychiatric history from my progress note on 11/17/2017.  Past trials of Zoloft, Paxil, trazodone  Past Medical History:  Past Medical History:  Diagnosis Date  . ADHD (attention deficit hyperactivity disorder)   . Anxiety   . B12 deficiency 02/07/2011  . Back pain   . CAD (coronary artery disease)    a. s/p recent STEMI on 09/15/2017 with DES to RCA  . Celiac  disease   . Folic acid deficiency 21/03/9415  . Headache   . Iron deficiency anemia 02/07/2011  . Myocardial infarction ([place]) 09/15/2017    Past Surgical History:  Procedure Laterality Date  . CHOLECYSTECTOMY    . COLONOSCOPY WITH PROPOFOL N/A 09/26/2017   Procedure: COLONOSCOPY WITH PROPOFOL;  Surgeon: Rogene Houston, MD;  Location: [facility];  Service: Endoscopy;  Laterality: N/A;  . CORONARY STENT INTERVENTION Right 09/15/2017   Procedure: CORONARY STENT INTERVENTION;  Surgeon: Jettie Booze, MD;  Location: [facility];  Service: Cardiovascular;  Laterality: Right;  RCA  . KNEE CARTILAGE SURGERY Right   . LEFT HEART CATH AND CORONARY ANGIOGRAPHY N/A 09/15/2017   Procedure: LEFT HEART CATH AND CORONARY ANGIOGRAPHY;  Surgeon: Jettie Booze, MD;  Location: [facility];  Service: Cardiovascular;  Laterality: N/A;  . TONSILLECTOMY      Family Psychiatric History: Reviewed family psychiatric history from my progress note on 11/17/2017  Family History:  Family History  Problem Relation Age of Onset  . Cancer Mother   . Hypertension Mother   . Hypertension Father   . Hypothyroidism Father   . Cancer Sister   . Anxiety disorder Sister   . Depression Sister   . CAD Maternal Grandfather   . Cirrhosis Paternal Grandfather   . CAD Paternal Grandmother   . Diabetes Paternal Grandmother   . Hypothyroidism Paternal Grandmother   . Drug abuse Son   . Bipolar disorder Son  Social History: Reviewed social history from my progress note on 11/17/2017 Social History   Socioeconomic History  . Marital status: Widowed    Spouse name: Not on file  . Number of children: 3  . Years of education: Not on file  . Highest education level: Associate degree: occupational, Hotel manager, or vocational program  Occupational History  . Not on file  Social Needs  . Financial resource strain: Not hard at all  . Food insecurity:    Worry: Never true    Inability: Never  true  . Transportation needs:    Medical: No    Non-medical: No  Tobacco Use  . Smoking status: Former Smoker    Packs/day: 0.25    Years: 12.00    Pack years: 3.00    Types: Cigarettes    Last attempt to quit: 04/04/2017    Years since quitting: 0.9  . Smokeless tobacco: Never Used  Substance and Sexual Activity  . Alcohol use: No  . Drug use: No  . Sexual activity: Not Currently  Lifestyle  . Physical activity:    Days per week: 3 days    Minutes per session: 30 min  . Stress: Very much  Relationships  . Social connections:    Talks on phone: More than three times a week    Gets together: More than three times a week    Attends religious service: More than 4 times per year    Active member of club or organization: No    Attends meetings of clubs or organizations: Never    Relationship status: Widowed  Other Topics Concern  . Not on file  Social History Narrative  . Not on file    Allergies:  Allergies  Allergen Reactions  . Bee Venom Anaphylaxis  . Erythromycin Anaphylaxis  . Cephalexin   . Neosporin Original [Bacitracin-Neomycin-Polymyxin] Swelling    Swelling at site of application  . Prednisone   . Sulfa Antibiotics Swelling    Sulfa eye drops caused eyes to swell  . Tobrex [Tobramycin] Swelling  . Tramadol Hives  . Penicillins Rash    Has patient had a PCN reaction causing immediate rash, facial/tongue/throat swelling, SOB or lightheadedness with hypotension: Yes Has patient had a PCN reaction causing severe rash involving mucus membranes or skin necrosis: No Has patient had a PCN reaction that required hospitalization: No Has patient had a PCN reaction occurring within the last 10 years: No If all of the above answers are "NO", then may proceed with Cephalosporin use.     Metabolic Disorder Labs: Lab Results  Component Value Date   HGBA1C 5.6 09/16/2017   MPG 114 09/16/2017   MPG 123 (H) 07/07/2013   No results found for: PROLACTIN Lab Results   Component Value Date   CHOL 77 09/16/2017   TRIG 64 09/16/2017   HDL 19 (L) 09/16/2017   CHOLHDL 4.1 09/16/2017   VLDL 13 09/16/2017   LDLCALC 45 09/16/2017   LDLCALC 97 07/07/2013   Lab Results  Component Value Date   TSH 2.471 07/07/2013   TSH 1.745 06/03/2012    Therapeutic Level Labs: No results found for: LITHIUM No results found for: VALPROATE No components found for:  CBMZ  Current Medications: Current Outpatient Medications  Medication Sig Dispense Refill  . aspirin 81 MG chewable tablet Chew 1 tablet (81 mg total) by mouth daily.    Marland Kitchen atorvastatin (LIPITOR) 40 MG tablet TAKE 1 TABLET BY MOUTH ONCE DAILY AT 6PM. 30 tablet 6  .  baclofen (LIORESAL) 20 MG tablet Take 1 tablet (20 mg total) by mouth daily. 30 tablet 2  . cholecalciferol (VITAMIN D) 1000 units tablet Take 5,000 Units by mouth daily.    . clonazePAM (KLONOPIN) 0.5 MG tablet Take 0.5-1 tablets (0.25-0.5 mg total) by mouth as directed. Twice a day as needed for severe panic sx. 45 tablet 1  . clopidogrel (PLAVIX) 75 MG tablet TAKE 1 TABLET BY MOUTH ONCE DAILY. 30 tablet 6  . Cyanocobalamin (VITAMIN B-12 IJ) Inject as directed every 30 (thirty) days.     . DULoxetine (CYMBALTA) 60 MG capsule Take 1 capsule (60 mg total) by mouth daily. 90 capsule 0  . famotidine (PEPCID) 20 MG tablet   3  . fluticasone (FLONASE) 50 MCG/ACT nasal spray Place into the nose.    . gabapentin (NEURONTIN) 300 MG capsule Take 3 capsules (900 mg total) by mouth at bedtime. 90 capsule 2  . lactobacillus acidophilus (BACID) TABS tablet Take 1 tablet by mouth daily.    Marland Kitchen levonorgestrel (MIRENA) 20 MCG/24HR IUD by Intrauterine route.    . metoprolol tartrate (LOPRESSOR) 25 MG tablet Take 1 tablet (25 mg total) by mouth 2 (two) times daily. 180 tablet 3  . nitroGLYCERIN (NITROSTAT) 0.4 MG SL tablet Place 1 tablet (0.4 mg total) under the tongue every 5 (five) minutes as needed for chest pain. 25 tablet 2  . nystatin (MYCOSTATIN/NYSTOP)  powder Apply topically.    . ondansetron (ZOFRAN-ODT) 8 MG disintegrating tablet Take 1 tablet by mouth 3 (three) times daily as needed for nausea.   3  . oxyCODONE (OXY IR/ROXICODONE) 5 MG immediate release tablet Take 1 tablet (5 mg total) by mouth every 6 (six) hours as needed for severe pain. Must last 30 days. 120 tablet 0  . pantoprazole (PROTONIX) 40 MG tablet TAKE 1 TABLET BY MOUTH ONCE DAILY BEFORE BREAKFAST. 30 tablet 6  . promethazine (PHENERGAN) 25 MG tablet Take 25 mg by mouth 3 (three) times daily as needed for nausea.   2  . torsemide (DEMADEX) 20 MG tablet   2  . mirtazapine (REMERON) 7.5 MG tablet Take 1 tablet (7.5 mg total) by mouth at bedtime. FOR sleep and mood 30 tablet 1   No current facility-administered medications for this visit.      Musculoskeletal: Strength & Muscle Tone: within normal limits Gait & Station: normal Patient leans: N/A  Psychiatric Specialty Exam: Review of Systems  Psychiatric/Behavioral: The patient has insomnia.   All other systems reviewed and are negative.   Blood pressure 107/73, pulse 82, temperature 98.7 F (37.1 C), temperature source Oral, weight 211 lb 12.8 oz (96.1 kg).Body mass index is 41.36 kg/m.  General Appearance: Casual  Eye Contact:  Fair  Speech:  Clear and Coherent  Volume:  Normal  Mood:  Euthymic  Affect:  Congruent  Thought Process:  Goal Directed and Descriptions of Associations: Intact  Orientation:  Full (Time, Place, and Person)  Thought Content: Logical   Suicidal Thoughts:  No  Homicidal Thoughts:  No  Memory:  Immediate;   Fair Recent;   Fair Remote;   Fair  Judgement:  Fair  Insight:  Fair  Psychomotor Activity:  Normal  Concentration:  Concentration: Fair and Attention Span: Fair  Recall:  [employer] of Knowledge: Fair  Language: Fair  Akathisia:  No  Handed:  Right  AIMS (if indicated): denies tremors, rigidity,stiffness  Assets:  Communication Skills Desire for Improvement Social Support   ADL's:  Intact  Cognition: WNL  Sleep:  Poor   Screenings: PHQ2-9     Office Visit from 12/29/2017 in [facility]  PHQ-2 Total Score  0       Assessment and Plan: Sharon Lawrence is a 53 year old Caucasian female, widowed, lives in [place], employed, has a history of depression, anxiety, insomnia, hyperlipidemia, coronary artery disease, myocardial infarction status post stent placement, presented to the clinic today for a follow-up visit.  Patient is biologically predisposed given her family history as well as her own health issues.  She also has psychosocial stressors of her son's substance abuse problem.  Patient recently developed side effects to her trazodone.  She continues to struggle with sleep.  We will continue to make medication changes.  Plan Panic disorder- Some progress Cymbalta 60 mg p.o. daily. Continue Klonopin 0.25 to 0.5 mg p.o. twice daily as needed.  She uses it when she has severe anxiety attacks has been trying to limit use.  For GAD-improving Cymbalta and Klonopin as prescribed  For MDD-improving Cymbalta 60 mg p.o. daily Continue CBT  For insomnia Discontinue trazodone for side effects Start mirtazapine 7.5 mg p.o. nightly  Follow-up in clinic in 4 weeks or sooner if needed.  Patient to continue psychotherapy sessions.  I have spent atleast 15 minutes face to face with patient today. More than 50 % of the time was spent for psychoeducation and supportive psychotherapy and care coordination.  This note was generated in part or whole with voice recognition software. Voice recognition is usually quite accurate but there are transcription errors that can and very often do occur. I apologize for any typographical errors that were not detected and corrected.        Ursula Alert, MD 03/10/2018, 5:29 PM

## 2018-03-10 NOTE — Patient Instructions (Signed)
Mirtazapine tablets What is this medicine? MIRTAZAPINE (mir TAZ a peen) is used to treat depression. This medicine may be used for other purposes; ask your health care provider or pharmacist if you have questions. COMMON BRAND NAME(S): Remeron What should I tell my health care provider before I take this medicine? They need to know if you have any of these conditions: -bipolar disorder -glaucoma -kidney disease -liver disease -suicidal thoughts -an unusual or allergic reaction to mirtazapine, other medicines, foods, dyes, or preservatives -pregnant or trying to get pregnant -breast-feeding How should I use this medicine? Take this medicine by mouth with a glass of water. Follow the directions on the prescription label. Take your medicine at regular intervals. Do not take your medicine more often than directed. Do not stop taking this medicine suddenly except upon the advice of your doctor. Stopping this medicine too quickly may cause serious side effects or your condition may worsen. A special MedGuide will be given to you by the pharmacist with each prescription and refill. Be sure to read this information carefully each time. Talk to your pediatrician regarding the use of this medicine in children. Special care may be needed. Overdosage: If you think you have taken too much of this medicine contact a poison control center or emergency room at once. NOTE: This medicine is only for you. Do not share this medicine with others. What if I miss a dose? If you miss a dose, take it as soon as you can. If it is almost time for your next dose, take only that dose. Do not take double or extra doses. What may interact with this medicine? Do not take this medicine with any of the following medications: -linezolid -MAOIs like Carbex, Eldepryl, Marplan, Nardil, and Parnate -methylene blue (injected into a vein) This medicine may also interact with the following medications: -alcohol -antiviral  medicines for HIV or AIDS -certain medicines that treat or prevent blood clots like warfarin -certain medicines for depression, anxiety, or psychotic disturbances -certain medicines for fungal infections like ketoconazole and itraconazole -certain medicines for migraine headache like almotriptan, eletriptan, frovatriptan, naratriptan, rizatriptan, sumatriptan, zolmitriptan -certain medicines for seizures like carbamazepine or phenytoin -certain medicines for sleep -cimetidine -erythromycin -fentanyl -lithium -medicines for blood pressure -nefazodone -rasagiline -rifampin -supplements like St. John's wort, kava kava, valerian -tramadol -tryptophan This list may not describe all possible interactions. Give your health care provider a list of all the medicines, herbs, non-prescription drugs, or dietary supplements you use. Also tell them if you smoke, drink alcohol, or use illegal drugs. Some items may interact with your medicine. What should I watch for while using this medicine? Tell your doctor if your symptoms do not get better or if they get worse. Visit your doctor or health care professional for regular checks on your progress. Because it may take several weeks to see the full effects of this medicine, it is important to continue your treatment as prescribed by your doctor. Patients and their families should watch out for new or worsening thoughts of suicide or depression. Also watch out for sudden changes in feelings such as feeling anxious, agitated, panicky, irritable, hostile, aggressive, impulsive, severely restless, overly excited and hyperactive, or not being able to sleep. If this happens, especially at the beginning of treatment or after a change in dose, call your health care professional. Dennis Bast may get drowsy or dizzy. Do not drive, use machinery, or do anything that needs mental alertness until you know how this medicine affects you. Do not  stand or sit up quickly, especially if  you are an older patient. This reduces the risk of dizzy or fainting spells. Alcohol may interfere with the effect of this medicine. Avoid alcoholic drinks. This medicine may cause dry eyes and blurred vision. If you wear contact lenses you may feel some discomfort. Lubricating drops may help. See your eye doctor if the problem does not go away or is severe. Your mouth may get dry. Chewing sugarless gum or sucking hard candy, and drinking plenty of water may help. Contact your doctor if the problem does not go away or is severe. What side effects may I notice from receiving this medicine? Side effects that you should report to your doctor or health care professional as soon as possible: -allergic reactions like skin rash, itching or hives, swelling of the face, lips, or tongue -anxious -changes in vision -chest pain -confusion -elevated mood, decreased need for sleep, racing thoughts, impulsive behavior -eye pain -fast, irregular heartbeat -feeling faint or lightheaded, falls -feeling agitated, angry, or irritable -fever or chills, sore throat -hallucination, loss of contact with reality -loss of balance or coordination -mouth sores -redness, blistering, peeling or loosening of the skin, including inside the mouth -restlessness, pacing, inability to keep still -seizures -stiff muscles -suicidal thoughts or other mood changes -trouble passing urine or change in the amount of urine -trouble sleeping -unusual bleeding or bruising -unusually weak or tired -vomiting Side effects that usually do not require medical attention (report to your doctor or health care professional if they continue or are bothersome): -change in appetite -constipation -dizziness -dry mouth -muscle aches or pains -nausea -tired -weight gain This list may not describe all possible side effects. Call your doctor for medical advice about side effects. You may report side effects to FDA at 1-800-FDA-1088. Where  should I keep my medicine? Keep out of the reach of children. Store at room temperature between 15 and 30 degrees C (59 and 86 degrees F) Protect from light and moisture. Throw away any unused medicine after the expiration date. NOTE: This sheet is a summary. It may not cover all possible information. If you have questions about this medicine, talk to your doctor, pharmacist, or health care provider.  2019 Elsevier/Gold Standard (2015-07-20 17:30:45)

## 2018-03-16 ENCOUNTER — Encounter: Payer: 59 | Admitting: Nurse Practitioner

## 2018-03-18 ENCOUNTER — Other Ambulatory Visit: Payer: Self-pay

## 2018-03-18 ENCOUNTER — Encounter: Payer: Self-pay | Admitting: Nurse Practitioner

## 2018-03-18 ENCOUNTER — Ambulatory Visit: Payer: 59 | Attending: Nurse Practitioner | Admitting: Nurse Practitioner

## 2018-03-18 VITALS — BP 120/75 | HR 73 | Temp 98.5°F | Resp 16 | Ht 60.0 in | Wt 202.9 lb

## 2018-03-18 DIAGNOSIS — G8929 Other chronic pain: Secondary | ICD-10-CM | POA: Diagnosis present

## 2018-03-18 DIAGNOSIS — M7918 Myalgia, other site: Secondary | ICD-10-CM | POA: Diagnosis not present

## 2018-03-18 DIAGNOSIS — M62830 Muscle spasm of back: Secondary | ICD-10-CM | POA: Diagnosis present

## 2018-03-18 DIAGNOSIS — M5134 Other intervertebral disc degeneration, thoracic region: Secondary | ICD-10-CM

## 2018-03-18 DIAGNOSIS — M792 Neuralgia and neuritis, unspecified: Secondary | ICD-10-CM | POA: Diagnosis present

## 2018-03-18 DIAGNOSIS — G894 Chronic pain syndrome: Secondary | ICD-10-CM

## 2018-03-18 DIAGNOSIS — M5136 Other intervertebral disc degeneration, lumbar region: Secondary | ICD-10-CM

## 2018-03-18 MED ORDER — OXYCODONE HCL 5 MG PO TABS: 5.0000 mg | ORAL_TABLET | Freq: Four times a day (QID) | ORAL | 0 refills | Status: DC | PRN

## 2018-03-18 NOTE — Progress Notes (Signed)
Patient's Name: Sharon Lawrence  MRN: [id number]  Referring Provider: Vidal Schwalbe, MD  DOB: [birth year]  PCP: Vidal Schwalbe, MD  DOS: 03/18/2018  Note by: Vevelyn Francois NP  Service setting: Ambulatory outpatient  Specialty: Interventional Pain Management  Location: [facility] (AMB) Pain Management Facility    Patient type: Established    Primary Reason(s) for Visit: Encounter for prescription drug management. (Level of risk: moderate)  CC: Back Pain (lower)  HPI  Sharon Lawrence is a 53 y.o. year old, female patient, who comes today for a medication management evaluation. She has Folic acid deficiency; Iron deficiency; B12 deficiency; Fatty liver disease, nonalcoholic; Vaginal condyloma; Obesity; Family hx of colon cancer; Non-intractable vomiting with nausea; Celiac disease; Acute ST elevation myocardial infarction (STEMI) of inferior wall ([place]); Hyperlipidemia; Hematochezia; STEMI (ST elevation myocardial infarction) ([place]), 09/15/17; Hypotension; Morbidly obese ([place]); Elevated LFTs; Chronic diarrhea, with Celiac disease; Former smoker; Hypothyroid; History of ST elevation myocardial infarction (STEMI); GI bleed; GERD without esophagitis; Bleeding per rectum; Bilateral lower extremity edema; Benign essential hypertension; Back muscle spasm; Anxiety disorder; Anemia; ADD (attention deficit disorder); Syncope; Sessile colonic polyp; Right-sided low back pain with sciatica; Refused influenza vaccine; RAD (reactive airway disease), moderate persistent, uncomplicated; Other chronic pain; Migraine; Major depressive disorder; Insomnia; IDA (iron deficiency anemia); Chronic low back pain (Primary Area of Pain) (Bilateral) (R>L) w/ sciatica (Right); Chronic lower extremity pain (Secondary Area of Pain) (Right); Chronic sacroiliac joint pain (Bilateral) (L>R); Chronic pain syndrome; Long term current use of opiate analgesic; Pharmacologic therapy; Disorder of skeletal system; Problems influencing health status; Abnormal MRI,  lumbar spine (07/16/2016); DDD (degenerative disc disease), lumbar; DDD (degenerative disc disease), thoracic; Lumbar facet arthropathy (Bilateral); Lumbar facet syndrome (Bilateral); Long term current use of anticoagulant therapy (Plavix); Neurogenic pain; and Chronic musculoskeletal pain on their problem list. Her primarily concern today is the Back Pain (lower)  Pain Assessment: Location: Lower Back Radiating: down back of right leg to knee Duration: Chronic pain Quality: Burning, Constant, Discomfort, Spasm Severity: 6 /10 (subjective, self-reported pain score)  Note: Reported level is compatible with observation. Clinically the patient looks like a 1/10 A 1/10 is viewed as "Mild" and described as nagging, annoying, but not interfering with basic activities of daily living (ADL). Sharon Lawrence is able to eat, bathe, get dressed, do toileting (being able to get on and off the toilet and perform personal hygiene functions), transfer (move in and out of bed or a chair without assistance), and maintain continence (able to control bladder and bowel functions). Physiologic parameters such as blood pressure and heart rate apear wnl. Sharon Lawrence does not seem to understand the use of our objective pain scale When using our objective Pain Scale, levels between 6 and 10/10 are said to belong in an emergency room, as it progressively worsens from a 6/10, described as severely limiting, requiring emergency care not usually available at an outpatient pain management facility. At a 6/10 level, communication becomes difficult and requires great effort. Assistance to reach the emergency department may be required. Facial flushing and profuse sweating along with potentially dangerous increases in heart rate and blood pressure will be evident. Effect on ADL: pain sometimes requires pt to miss work, but not as often since she has been on meds Timing: Constant Modifying factors: meds, heating pad BP: 120/75  HR: 73  Sharon Lawrence  was last scheduled for an appointment on 02/18/2018 for medication management. During today's appointment we reviewed Sharon Lawrence's chronic pain status, as well as her outpatient  medication regimen. She feels like her pain is more controlled.  She has numbness, tingling with burning. She does has weakness in her legs. . She is also having back spasms. She admits that she walks and does yoga. She was diagnosed with Serotonin Syndrome in with the combination of Trazodone and Cymbalta. She does follow psychiatry.  She admits that she is doing well on just the Cymbalta.  She was given Remeron however she has not started this.  She has gone back to her Melatonin for sleep.   The patient  reports no history of drug use. Her body mass index is 39.63 kg/m.  Further details on both, my assessment(s), as well as the proposed treatment plan, please see below.  Controlled Substance Pharmacotherapy Assessment REMS (Risk Evaluation and Mitigation Strategy)  Analgesic:Oxycodone IR 5 mg every 6 hours (20 mg/day of oxycodone) MME/day:54m/day. WRise Patience RN  03/18/2018  9:26 AM  Signed Nursing Pain Medication Assessment:  Safety precautions to be maintained throughout the outpatient stay will include: orient to surroundings, keep bed in low position, maintain call bell within reach at all times, provide assistance with transfer out of bed and ambulation.  Medication Inspection Compliance: Pill count conducted under aseptic conditions, in front of the patient. Neither the pills nor the bottle was removed from the patient's sight at any time. Once count was completed pills were immediately returned to the patient in their original bottle.  Medication: Oxycodone IR Pill/Patch Count: 79 of 120 pills remain Pill/Patch Appearance: Markings consistent with prescribed medication Bottle Appearance: Standard pharmacy container. Clearly labeled. Filled Date: 01 / 02 / 2020 Last Medication intake:  Yesterday    Pharmacokinetics: Liberation and absorption (onset of action): WNL Distribution (time to peak effect): WNL Metabolism and excretion (duration of action): WNL         Pharmacodynamics: Desired effects: Analgesia: Ms. CJankowiakreports >50% benefit. Functional ability: Patient reports that medication allows her to accomplish basic ADLs Clinically meaningful improvement in function (CMIF): Sustained CMIF goals met Perceived effectiveness: Described as relatively effective, allowing for increase in activities of daily living (ADL) Undesirable effects: Side-effects or Adverse reactions: None reported Monitoring: [place] PMP: Online review of the past 170-montheriod conducted. Compliant with practice rules and regulations Last UDS on record: Summary  Date Value Ref Range Status  12/03/2017 FINAL  Final    Comment:    ==================================================================== [facility] COMP DRUG ANALYSIS,UR ==================================================================== Test                             Result       Flag       Units Drug Present and Declared for Prescription Verification   Oxycodone                      1649         EXPECTED   ng/mg creat   Oxymorphone                    1737         EXPECTED   ng/mg creat   Noroxycodone                   629          EXPECTED   ng/mg creat   Noroxymorphone                 220  EXPECTED   ng/mg creat    Sources of oxycodone are scheduled prescription medications.    Oxymorphone, noroxycodone, and noroxymorphone are expected    metabolites of oxycodone. Oxymorphone is also available as a    scheduled prescription medication.   Gabapentin                     PRESENT      EXPECTED   Trazodone                      PRESENT      EXPECTED   Promethazine                   PRESENT      EXPECTED   Metoprolol                     PRESENT      EXPECTED Drug Present not Declared for Prescription Verification   Acetaminophen                   PRESENT      UNEXPECTED Drug Absent but Declared for Prescription Verification   Clonazepam                     Not Detected UNEXPECTED ng/mg creat   Baclofen                       Not Detected UNEXPECTED   Duloxetine                     Not Detected UNEXPECTED   Salicylate                     Not Detected UNEXPECTED    Aspirin, as indicated in the declared medication list, is not    always detected even when used as directed. ==================================================================== Test                      Result    Flag   Units      Ref Range   Creatinine              252              mg/dL      >=20 ==================================================================== Declared Medications:  The flagging and interpretation on this report are based on the  following declared medications.  Unexpected results may arise from  inaccuracies in the declared medications.  **Note: The testing scope of this panel includes these medications:  Baclofen (Lioresal)  Clonazepam (Klonopin)  Duloxetine (Cymbalta)  Gabapentin  Metoprolol  Oxycodone  Promethazine  Trazodone  **Note: The testing scope of this panel does not include small to  moderate amounts of these reported medications:  Aspirin  **Note: The testing scope of this panel does not include following  reported medications:  Atorvastatin (Lipitor)  Clopidogrel (Plavix)  Fluticasone (Flonase)  Hydrochlorothiazide  Levonorgestrel  Nitroglycerin  Nystatin  Ondansetron  Pantoprazole  Torsemide  Vitamin D ==================================================================== For clinical consultation, please call [phone]. ====================================================================    UDS interpretation: Compliant          Medication Assessment Form: Reviewed. Patient indicates being compliant with therapy Treatment compliance: Compliant Risk Assessment Profile: Aberrant behavior: See prior  evaluations. None observed or detected today Comorbid factors increasing risk of overdose: See prior notes. No additional risks detected today Opioid risk  tool (ORT) (Total Score): 6 Personal History of Substance Abuse (SUD-Substance use disorder):  Alcohol: Negative  Illegal Drugs: Negative  Rx Drugs: Negative  ORT Risk Level calculation: Moderate Risk Risk of substance use disorder (SUD): Moderate Opioid Risk Tool - 03/18/18 0923      Family History of Substance Abuse   Alcohol  Positive Female    Illegal Drugs  Positive Female    Rx Drugs  Negative      Personal History of Substance Abuse   Alcohol  Negative    Illegal Drugs  Negative    Rx Drugs  Negative      Age   Age between 6-45 years   No      History of Preadolescent Sexual Abuse   History of Preadolescent Sexual Abuse  Negative or Female      Psychological Disease   Psychological Disease  Positive    ADD  Positive    Depression  Positive      Total Score   Opioid Risk Tool Scoring  6    Opioid Risk Interpretation  Moderate Risk      ORT Scoring interpretation table:  Score <3 = Low Risk for SUD  Score between 4-7 = Moderate Risk for SUD  Score >8 = High Risk for Opioid Abuse   Risk Mitigation Strategies:  Patient Counseling: Covered Patient-Prescriber Agreement (PPA): Present and active  Notification to other healthcare providers: Done  Pharmacologic Plan: No change in therapy, at this time.             Laboratory Chemistry  Inflammation Markers (CRP: Acute Phase) (ESR: Chronic Phase) Lab Results  Component Value Date   CRP <0.8 12/03/2017   ESRSEDRATE 10 12/03/2017                         Rheumatology Markers No results found for: RF, ANA, LABURIC, URICUR, LYMEIGGIGMAB, LYMEABIGMQN, HLAB27                      Renal Function Markers Lab Results  Component Value Date   BUN 12 12/31/2017   CREATININE 0.76 12/31/2017   GFRAA >60 12/31/2017   GFRNONAA >60 12/31/2017                              Hepatic Function Markers Lab Results  Component Value Date   AST 29 12/31/2017   ALT 50 (H) 12/31/2017   ALBUMIN 3.4 (L) 12/31/2017   ALKPHOS 97 12/31/2017                        Electrolytes Lab Results  Component Value Date   NA 142 12/31/2017   K 3.5 12/31/2017   CL 106 12/31/2017   CALCIUM 8.4 (L) 12/31/2017   MG 1.9 12/03/2017                        Neuropathy Markers Lab Results  Component Value Date   VITAMINB12 318 02/14/2011   FOLATE 5.7 06/03/2012   HGBA1C 5.6 09/16/2017                        CNS Tests No results found for: COLORCSF, APPEARCSF, RBCCOUNTCSF, WBCCSF, POLYSCSF, LYMPHSCSF, EOSCSF, PROTEINCSF, GLUCCSF, JCVIRUS, CSFOLI, IGGCSF  Bone Pathology Markers Lab Results  Component Value Date   VD25OH  07/20/2008    37 (NOTE) This assay accurately quantifies Vitamin D, which is the sum of the 25-Hydroxy forms of Vitamin D2 and D3.  Studies have shown that the optimum concentration of 25-Hydroxy Vitamin D is 30 ng/mL or higher.  Concentrations of Vitamin D between 20  and 29 ng/mL are considered to be insufficient and concentrations less than 20 ng/mL are considered to be deficient for Vitamin D.   [id number] 47 04/25/2009   [id number] 47 04/25/2009   [id number]  04/25/2009    <8 (NOTE) Vitamin D3, 1,25(OH)2 indicates both endogenous production and supplementation.  Vitamin D2, 1,25(OH)2 is an indicator of exogeous sources, such as diet or supplementation.  Interpretation and therapy are based on measurement of Vitamin  D,1,25(OH)2, Total. This test was developed and its performance characteristics have been determined by [facility], [place], [place]. Performance characteristics refer to the analytical performance of the test.                         Coagulation Parameters Lab Results  Component Value Date   INR 1.14 09/25/2017   LABPROT 14.5 09/25/2017   PLT 534 (H) 12/31/2017                         Cardiovascular Markers Lab Results  Component Value Date   TROPONINI <0.03 12/31/2017   HGB 11.8 (L) 12/31/2017   HCT 39.2 12/31/2017                         CA Markers No results found for: CEA, CA125, LABCA2                      Note: Lab results reviewed.  Recent Diagnostic Imaging Results  DG Chest 2 View CLINICAL DATA:  Chest pain.  EXAM: CHEST - 2 VIEW  COMPARISON:  Radiograph September 15, 2017.  FINDINGS: The heart size and mediastinal contours are within normal limits. Both lungs are clear. No pneumothorax or pleural effusion is noted. The visualized skeletal structures are unremarkable.  IMPRESSION: No active cardiopulmonary disease.  Electronically Signed   By: Marijo Conception, M.D.   On: 12/31/2017 19:05  Complexity Note: Imaging results reviewed. Results shared with Sharon Lawrence, using Layman's terms.                         Meds   Current Outpatient Medications:  .  aspirin 81 MG chewable tablet, Chew 1 tablet (81 mg total) by mouth daily., Disp: , Rfl:  .  atorvastatin (LIPITOR) 40 MG tablet, TAKE 1 TABLET BY MOUTH ONCE DAILY AT 6PM., Disp: 30 tablet, Rfl: 6 .  baclofen (LIORESAL) 20 MG tablet, Take 1 tablet (20 mg total) by mouth daily., Disp: 30 tablet, Rfl: 2 .  cholecalciferol (VITAMIN D) 1000 units tablet, Take 5,000 Units by mouth daily., Disp: , Rfl:  .  clonazePAM (KLONOPIN) 0.5 MG tablet, Take 0.5-1 tablets (0.25-0.5 mg total) by mouth as directed. Twice a day as needed for severe panic sx., Disp: 45 tablet, Rfl: 1 .  clopidogrel (PLAVIX) 75 MG tablet, TAKE 1 TABLET BY MOUTH ONCE DAILY., Disp: 30 tablet, Rfl: 6 .  Cyanocobalamin (VITAMIN B-12 IJ), Inject as directed every 30 (thirty) days. , Disp: , Rfl:  .  DULoxetine (CYMBALTA)  60 MG capsule, Take 1 capsule (60 mg total) by mouth daily., Disp: 90 capsule, Rfl: 0 .  famotidine (PEPCID) 20 MG tablet, , Disp: , Rfl: 3 .  fluticasone (FLONASE) 50 MCG/ACT nasal spray, Place into the nose., Disp: , Rfl:   .  gabapentin (NEURONTIN) 300 MG capsule, Take 3 capsules (900 mg total) by mouth at bedtime., Disp: 90 capsule, Rfl: 2 .  lactobacillus acidophilus (BACID) TABS tablet, Take 1 tablet by mouth daily., Disp: , Rfl:  .  levonorgestrel (MIRENA) 20 MCG/24HR IUD, by Intrauterine route., Disp: , Rfl:  .  metoprolol tartrate (LOPRESSOR) 25 MG tablet, Take 1 tablet (25 mg total) by mouth 2 (two) times daily., Disp: 180 tablet, Rfl: 3 .  mirtazapine (REMERON) 7.5 MG tablet, Take 1 tablet (7.5 mg total) by mouth at bedtime. FOR sleep and mood, Disp: 30 tablet, Rfl: 1 .  nitroGLYCERIN (NITROSTAT) 0.4 MG SL tablet, Place 1 tablet (0.4 mg total) under the tongue every 5 (five) minutes as needed for chest pain., Disp: 25 tablet, Rfl: 2 .  nystatin (MYCOSTATIN/NYSTOP) powder, Apply topically., Disp: , Rfl:  .  ondansetron (ZOFRAN-ODT) 8 MG disintegrating tablet, Take 1 tablet by mouth 3 (three) times daily as needed for nausea. , Disp: , Rfl: 3 .  [START ON 04/04/2018] oxyCODONE (OXY IR/ROXICODONE) 5 MG immediate release tablet, Take 1 tablet (5 mg total) by mouth every 6 (six) hours as needed for up to 30 days for severe pain. Must last 30 days., Disp: 120 tablet, Rfl: 0 .  pantoprazole (PROTONIX) 40 MG tablet, TAKE 1 TABLET BY MOUTH ONCE DAILY BEFORE BREAKFAST., Disp: 30 tablet, Rfl: 6 .  promethazine (PHENERGAN) 25 MG tablet, Take 25 mg by mouth 3 (three) times daily as needed for nausea. , Disp: , Rfl: 2 .  torsemide (DEMADEX) 20 MG tablet, , Disp: , Rfl: 2  ROS  Constitutional: Denies any fever or chills Gastrointestinal: No reported hemesis, hematochezia, vomiting, or acute GI distress Musculoskeletal: Denies any acute onset joint swelling, redness, loss of ROM, or weakness Neurological: No reported episodes of acute onset apraxia, aphasia, dysarthria, agnosia, amnesia, paralysis, loss of coordination, or loss of consciousness  Allergies  Sharon Lawrence is allergic to bee venom; erythromycin; cephalexin;  neosporin original [bacitracin-neomycin-polymyxin]; prednisone; sulfa antibiotics; tobrex [tobramycin]; tramadol; and penicillins.  PFSH  Drug: Sharon Lawrence  reports no history of drug use. Alcohol:  reports no history of alcohol use. Tobacco:  reports that she quit smoking about a year ago. Her smoking use included cigarettes. She has a 3.00 pack-year smoking history. She has never used smokeless tobacco. Medical:  has a past medical history of ADHD (attention deficit hyperactivity disorder), Anxiety, B12 deficiency (02/07/2011), Back pain, CAD (coronary artery disease), Celiac disease, Folic acid deficiency (02/07/2011), Headache, Iron deficiency anemia (02/07/2011), and Myocardial infarction ([place]) (09/15/2017). Surgical: Sharon Lawrence  has a past surgical history that includes Tonsillectomy; Cholecystectomy; Knee cartilage surgery (Right); LEFT HEART CATH AND CORONARY ANGIOGRAPHY (N/A, 09/15/2017); CORONARY STENT INTERVENTION (Right, 09/15/2017); and Colonoscopy with propofol (N/A, 09/26/2017). Family: family history includes Anxiety disorder in her sister; Bipolar disorder in her son; CAD in her maternal grandfather and paternal grandmother; Cancer in her mother and sister; Cirrhosis in her paternal grandfather; Depression in her sister; Diabetes in her paternal grandmother; Drug abuse in her son; Hypertension in her father and mother; Hypothyroidism in her father and paternal grandmother.  Constitutional Exam  General appearance: Well nourished, well developed, and well hydrated. In no apparent acute distress Vitals:  03/18/18 0915  BP: 120/75  Pulse: 73  Resp: 16  Temp: 98.5 F (36.9 C)  TempSrc: Oral  SpO2: 98%  Weight: 202 lb 14.4 oz (92 kg)  Height: 5' (1.524 m)  Psych/Mental status: Alert, oriented x 3 (person, place, & time)       Eyes: PERLA Respiratory: No evidence of acute respiratory distress  Cervical Spine Area Exam  Skin & Axial Inspection: No masses, redness, edema, swelling, or  associated skin lesions Alignment: Symmetrical Functional ROM: Unrestricted ROM      Stability: No instability detected Muscle Tone/Strength: Functionally intact. No obvious neuro-muscular anomalies detected. Sensory (Neurological): Unimpaired Palpation: No palpable anomalies              Upper Extremity (UE) Exam    Side: Right upper extremity  Side: Left upper extremity  Skin & Extremity Inspection: Skin color, temperature, and hair growth are WNL. No peripheral edema or cyanosis. No masses, redness, swelling, asymmetry, or associated skin lesions. No contractures.  Skin & Extremity Inspection: Skin color, temperature, and hair growth are WNL. No peripheral edema or cyanosis. No masses, redness, swelling, asymmetry, or associated skin lesions. No contractures.  Functional ROM: Unrestricted ROM          Functional ROM: Unrestricted ROM          Muscle Tone/Strength: Functionally intact. No obvious neuro-muscular anomalies detected.  Muscle Tone/Strength: Functionally intact. No obvious neuro-muscular anomalies detected.  Sensory (Neurological): Unimpaired          Sensory (Neurological): Unimpaired          Palpation: No palpable anomalies              Palpation: No palpable anomalies                   Thoracic Spine Area Exam  Skin & Axial Inspection: No masses, redness, or swelling Alignment: Symmetrical Functional ROM: Unrestricted ROM Stability: No instability detected Muscle Tone/Strength: Functionally intact. No obvious neuro-muscular anomalies detected. Sensory (Neurological): Unimpaired Muscle strength & Tone: No palpable anomalies  Lumbar Spine Area Exam  Skin & Axial Inspection: No masses, redness, or swelling Alignment: Symmetrical Functional ROM: Unrestricted ROM       Stability: No instability detected Muscle Tone/Strength: Functionally intact. No obvious neuro-muscular anomalies detected. Sensory (Neurological): Unimpaired Palpation: No palpable anomalies        Provocative Tests: Hyperextension/rotation test: deferred today       Lumbar quadrant test (Kemp's test): deferred today       Lateral bending test: deferred today       Patrick's Maneuver: deferred today                    Gait & Posture Assessment  Ambulation: Unassisted Gait: Relatively normal for age and body habitus Posture: WNL   Lower Extremity Exam    Side: Right lower extremity  Side: Left lower extremity  Stability: No instability observed          Stability: No instability observed          Skin & Extremity Inspection: Skin color, temperature, and hair growth are WNL. No peripheral edema or cyanosis. No masses, redness, swelling, asymmetry, or associated skin lesions. No contractures.  Skin & Extremity Inspection: Skin color, temperature, and hair growth are WNL. No peripheral edema or cyanosis. No masses, redness, swelling, asymmetry, or associated skin lesions. No contractures.  Functional ROM: Unrestricted ROM  Functional ROM: Unrestricted ROM                  Muscle Tone/Strength: Functionally intact. No obvious neuro-muscular anomalies detected.  Muscle Tone/Strength: Functionally intact. No obvious neuro-muscular anomalies detected.  Sensory (Neurological): Unimpaired        Sensory (Neurological): Unimpaired            Palpation: No palpable anomalies  Palpation: No palpable anomalies   Assessment  Primary Diagnosis & Pertinent Problem List: The primary encounter diagnosis was DDD (degenerative disc disease), lumbar. Diagnoses of DDD (degenerative disc disease), thoracic, Back muscle spasm, Chronic musculoskeletal pain, Neurogenic pain, and Chronic pain syndrome were also pertinent to this visit.  Status Diagnosis  Controlled Controlled Controlled 1. DDD (degenerative disc disease), lumbar   2. DDD (degenerative disc disease), thoracic   3. Back muscle spasm   4. Chronic musculoskeletal pain   5. Neurogenic pain   6. Chronic pain syndrome      Problems updated and reviewed during this visit: No problems updated. Plan of Care  Pharmacotherapy (Medications Ordered): Meds ordered this encounter  Medications  . oxyCODONE (OXY IR/ROXICODONE) 5 MG immediate release tablet    Sig: Take 1 tablet (5 mg total) by mouth every 6 (six) hours as needed for up to 30 days for severe pain. Must last 30 days.    Dispense:  120 tablet    Refill:  0    [place] STOP ACT - Not applicable. Fill one day early if pharmacy is closed on scheduled refill date. Must last 30 days.    Order Specific Question:   Supervising Provider    Answer:   Milinda Pointer [[id number]]   New Prescriptions   No medications on file   Medications administered today: Sharon Lawrence had no medications administered during this visit. Lab-work, procedure(s), and/or referral(s): No orders of the defined types were placed in this encounter.  Imaging and/or referral(s): None  Interventional management options: Planned, scheduled, and/or pending: No blocks until after July 15th, 2020due to MI and blood thinners.   Considering: Diagnostic bilateral lumbar facet nerve block Possible bilateral lumbar facet radiofrequency ablation Diagnostic sacroiliac joint nerve block Possible sacroiliac joint radiofrequency ablation   PRN Procedures: None at this time    Provider-requested follow-up: Return in about 4 weeks (around 04/15/2018) for MedMgmt.  Future Appointments  Date Time Provider [place]  04/08/2018  1:00 PM Ursula Alert, MD [facility] None  04/15/2018  9:15 AM Vevelyn Francois, NP [facility] None  10/19/2018  9:30 AM Vaughan Basta, Rona Ravens, NP [facility] None   Primary Care Physician: Vidal Schwalbe, MD Location: [facility] Note by: Vevelyn Francois NP Date: 03/18/2018; Time: 1:35 PM  Pain Score Disclaimer: We use the NRS-11 scale. This is a self-reported, subjective measurement of pain severity with only modest accuracy. It  is used primarily to identify changes within a particular patient. It must be understood that outpatient pain scales are significantly less accurate that those used for research, where they can be applied under ideal controlled circumstances with minimal exposure to variables. In reality, the score is likely to be a combination of pain intensity and pain affect, where pain affect describes the degree of emotional arousal or changes in action readiness caused by the sensory experience of pain. Factors such as social and work situation, setting, emotional state, anxiety levels, expectation, and prior pain experience may influence pain perception and show large inter-individual differences that may also be affected by  time variables.  Patient instructions provided during this appointment: Patient Instructions   ____________________________________________________________________________________________  Medication Rules  Purpose: To inform patients, and their family members, of our rules and regulations.  Applies to: All patients receiving prescriptions (written or electronic).  Pharmacy of record: Pharmacy where electronic prescriptions will be sent. If written prescriptions are taken to a different pharmacy, please inform the nursing staff. The pharmacy listed in the electronic medical record should be the one where you would like electronic prescriptions to be sent.  Electronic prescriptions: In compliance with the [place] (STOP) Act of 2017 (Session Sharon Lawrence [phone]), effective March 04, 2018, all controlled substances must be electronically prescribed. Calling prescriptions to the pharmacy will cease to exist.  Prescription refills: Only during scheduled appointments. Applies to all prescriptions.  NOTE: The following applies primarily to controlled substances (Opioid* Pain Medications).   Patient's responsibilities: 1. Pain Pills: Bring all pain  pills to every appointment (except for procedure appointments). 2. Pill Bottles: Bring pills in original pharmacy bottle. Always bring the newest bottle. Bring bottle, even if empty. 3. Medication refills: You are responsible for knowing and keeping track of what medications you take and those you need refilled. The day before your appointment: write a list of all prescriptions that need to be refilled. The day of the appointment: give the list to the admitting nurse. Prescriptions will be written only during appointments. If you forget a medication: it will not be "Called in", "Faxed", or "electronically sent". You will need to get another appointment to get these prescribed. No early refills. Do not call asking to have your prescription filled early. 4. Prescription Accuracy: You are responsible for carefully inspecting your prescriptions before leaving our office. Have the discharge nurse carefully go over each prescription with you, before taking them home. Make sure that your name is accurately spelled, that your address is correct. Check the name and dose of your medication to make sure it is accurate. Check the number of pills, and the written instructions to make sure they are clear and accurate. Make sure that you are given enough medication to last until your next medication refill appointment. 5. Taking Medication: Take medication as prescribed. When it comes to controlled substances, taking less pills or less frequently than prescribed is permitted and encouraged. Never take more pills than instructed. Never take medication more frequently than prescribed.  6. Inform other Doctors: Always inform, all of your healthcare providers, of all the medications you take. 7. Pain Medication from other Providers: You are not allowed to accept any additional pain medication from any other Doctor or Healthcare provider. There are two exceptions to this rule. (see below) In the event that you require  additional pain medication, you are responsible for notifying us, as stated below. 8. Medication Agreement: You are responsible for carefully reading and following our Medication Agreement. This must be signed before receiving any prescriptions from our practice. Safely store a copy of your signed Agreement. Violations to the Agreement will result in no further prescriptions. (Additional copies of our Medication Agreement are available upon request.) 9. Laws, Rules, & Regulations: All patients are expected to follow all Federal and [employer], [employer], Rules, [employer]. Ignorance of the Laws does not constitute a valid excuse. The use of any illegal substances is prohibited. 10. Adopted CDC guidelines & recommendations: Target dosing levels will be at or below 60 MME/day. Use of benzodiazepines** is not recommended.  Exceptions: There are only two exceptions  to the rule of not receiving pain medications from other Healthcare Providers. 1. Exception #1 (Emergencies): In the event of an emergency (i.e.: accident requiring emergency care), you are allowed to receive additional pain medication. However, you are responsible for: As soon as you are able, call our office [phone], at any time of the day or night, and leave a message stating your name, the date and nature of the emergency, and the name and dose of the medication prescribed. In the event that your call is answered by a member of our staff, make sure to document and save the date, time, and the name of the person that took your information.  2. Exception #2 (Planned Surgery): In the event that you are scheduled by another doctor or dentist to have any type of surgery or procedure, you are allowed (for a period no longer than 30 days), to receive additional pain medication, for the acute post-op pain. However, in this case, you are responsible for picking up a copy of our "Post-op Pain Management for Surgeons" handout, and giving it to your  surgeon or dentist. This document is available at our office, and does not require an appointment to obtain it. Simply go to our office during business hours (Monday-Thursday from 8:00 AM to 4:00 PM) (Friday 8:00 AM to 12:00 Noon) or if you have a scheduled appointment with [place], prior to your surgery, and ask for it by name. In addition, you will need to provide [place] with your name, name of your surgeon, type of surgery, and date of procedure or surgery.  *Opioid medications include: morphine, codeine, oxycodone, oxymorphone, hydrocodone, hydromorphone, meperidine, tramadol, tapentadol, buprenorphine, fentanyl, methadone. **Benzodiazepine medications include: diazepam (Valium), alprazolam (Xanax), clonazepam (Klonopine), lorazepam (Ativan), clorazepate (Tranxene), chlordiazepoxide (Librium), estazolam (Prosom), oxazepam (Serax), temazepam (Restoril), triazolam (Halcion) (Last updated: 05/01/2017) ____________________________________________________________________________________________    BMI Assessment: Estimated body mass index is 39.63 kg/m as calculated from the following:   Height as of this encounter: 5' (1.524 m).   Weight as of this encounter: 202 lb 14.4 oz (92 kg).  BMI interpretation table: BMI level Category Range association with higher incidence of chronic pain  <18 kg/m2 Underweight   18.5-24.9 kg/m2 Ideal body weight   25-29.9 kg/m2 Overweight Increased incidence by 20%  30-34.9 kg/m2 Obese (Class I) Increased incidence by 68%  35-39.9 kg/m2 Severe obesity (Class II) Increased incidence by 136%  >40 kg/m2 Extreme obesity (Class III) Increased incidence by 254%   Patient's current BMI Ideal Body weight  Body mass index is 39.63 kg/m. Ideal body weight: 45.5 kg (100 lb 4.9 oz) Adjusted ideal body weight: 64.1 kg (141 lb 5.5 oz)   BMI Readings from Last 4 Encounters:  03/18/18 39.63 kg/m  02/18/18 37.11 kg/m  01/26/18 38.08 kg/m  01/16/18 35.02 kg/m   Wt Readings  from Last 4 Encounters:  03/18/18 202 lb 14.4 oz (92 kg)  02/18/18 190 lb (86.2 kg)  01/26/18 195 lb (88.5 kg)  01/16/18 204 lb (92.5 kg)

## 2018-03-18 NOTE — Progress Notes (Signed)
Nursing Pain Medication Assessment:  Safety precautions to be maintained throughout the outpatient stay will include: orient to surroundings, keep bed in low position, maintain call bell within reach at all times, provide assistance with transfer out of bed and ambulation.  Medication Inspection Compliance: Pill count conducted under aseptic conditions, in front of the patient. Neither the pills nor the bottle was removed from the patient's sight at any time. Once count was completed pills were immediately returned to the patient in their original bottle.  Medication: Oxycodone IR Pill/Patch Count: 79 of 120 pills remain Pill/Patch Appearance: Markings consistent with prescribed medication Bottle Appearance: Standard pharmacy container. Clearly labeled. Filled Date: 01 / 02 / 2020 Last Medication intake:  Yesterday

## 2018-03-18 NOTE — Patient Instructions (Addendum)
____________________________________________________________________________________________  Medication Rules  Purpose: To inform patients, and their family members, of our rules and regulations.  Applies to: All patients receiving prescriptions (written or electronic).  Pharmacy of record: Pharmacy where electronic prescriptions will be sent. If written prescriptions are taken to a different pharmacy, please inform the nursing staff. The pharmacy listed in the electronic medical record should be the one where you would like electronic prescriptions to be sent.  Electronic prescriptions: In compliance with the [place] (STOP) Act of 2017 (Session Lanny Cramp [phone]), effective March 04, 2018, all controlled substances must be electronically prescribed. Calling prescriptions to the pharmacy will cease to exist.  Prescription refills: Only during scheduled appointments. Applies to all prescriptions.  NOTE: The following applies primarily to controlled substances (Opioid* Pain Medications).   Patient's responsibilities: 1. Pain Pills: Bring all pain pills to every appointment (except for procedure appointments). 2. Pill Bottles: Bring pills in original pharmacy bottle. Always bring the newest bottle. Bring bottle, even if empty. 3. Medication refills: You are responsible for knowing and keeping track of what medications you take and those you need refilled. The day before your appointment: write a list of all prescriptions that need to be refilled. The day of the appointment: give the list to the admitting nurse. Prescriptions will be written only during appointments. If you forget a medication: it will not be "Called in", "Faxed", or "electronically sent". You will need to get another appointment to get these prescribed. No early refills. Do not call asking to have your prescription filled early. 4. Prescription Accuracy: You are responsible for  carefully inspecting your prescriptions before leaving our office. Have the discharge nurse carefully go over each prescription with you, before taking them home. Make sure that your name is accurately spelled, that your address is correct. Check the name and dose of your medication to make sure it is accurate. Check the number of pills, and the written instructions to make sure they are clear and accurate. Make sure that you are given enough medication to last until your next medication refill appointment. 5. Taking Medication: Take medication as prescribed. When it comes to controlled substances, taking less pills or less frequently than prescribed is permitted and encouraged. Never take more pills than instructed. Never take medication more frequently than prescribed.  6. Inform other Doctors: Always inform, all of your healthcare providers, of all the medications you take. 7. Pain Medication from other Providers: You are not allowed to accept any additional pain medication from any other Doctor or Healthcare provider. There are two exceptions to this rule. (see below) In the event that you require additional pain medication, you are responsible for notifying us, as stated below. 8. Medication Agreement: You are responsible for carefully reading and following our Medication Agreement. This must be signed before receiving any prescriptions from our practice. Safely store a copy of your signed Agreement. Violations to the Agreement will result in no further prescriptions. (Additional copies of our Medication Agreement are available upon request.) 9. Laws, Rules, & Regulations: All patients are expected to follow all Federal and [employer], [employer], Rules, [employer]. Ignorance of the Laws does not constitute a valid excuse. The use of any illegal substances is prohibited. 10. Adopted CDC guidelines & recommendations: Target dosing levels will be at or below 60 MME/day. Use of benzodiazepines** is not  recommended.  Exceptions: There are only two exceptions to the rule of not receiving pain medications from other Healthcare Providers. 1.  Exception #1 (Emergencies): In the event of an emergency (i.e.: accident requiring emergency care), you are allowed to receive additional pain medication. However, you are responsible for: As soon as you are able, call our office [phone], at any time of the day or night, and leave a message stating your name, the date and nature of the emergency, and the name and dose of the medication prescribed. In the event that your call is answered by a member of our staff, make sure to document and save the date, time, and the name of the person that took your information.  2. Exception #2 (Planned Surgery): In the event that you are scheduled by another doctor or dentist to have any type of surgery or procedure, you are allowed (for a period no longer than 30 days), to receive additional pain medication, for the acute post-op pain. However, in this case, you are responsible for picking up a copy of our "Post-op Pain Management for Surgeons" handout, and giving it to your surgeon or dentist. This document is available at our office, and does not require an appointment to obtain it. Simply go to our office during business hours (Monday-Thursday from 8:00 AM to 4:00 PM) (Friday 8:00 AM to 12:00 Noon) or if you have a scheduled appointment with [place], prior to your surgery, and ask for it by name. In addition, you will need to provide [place] with your name, name of your surgeon, type of surgery, and date of procedure or surgery.  *Opioid medications include: morphine, codeine, oxycodone, oxymorphone, hydrocodone, hydromorphone, meperidine, tramadol, tapentadol, buprenorphine, fentanyl, methadone. **Benzodiazepine medications include: diazepam (Valium), alprazolam (Xanax), clonazepam (Klonopine), lorazepam (Ativan), clorazepate (Tranxene), chlordiazepoxide (Librium), estazolam (Prosom),  oxazepam (Serax), temazepam (Restoril), triazolam (Halcion) (Last updated: 05/01/2017) ____________________________________________________________________________________________    BMI Assessment: Estimated body mass index is 39.63 kg/m as calculated from the following:   Height as of this encounter: 5' (1.524 m).   Weight as of this encounter: 202 lb 14.4 oz (92 kg).  BMI interpretation table: BMI level Category Range association with higher incidence of chronic pain  <18 kg/m2 Underweight   18.5-24.9 kg/m2 Ideal body weight   25-29.9 kg/m2 Overweight Increased incidence by 20%  30-34.9 kg/m2 Obese (Class I) Increased incidence by 68%  35-39.9 kg/m2 Severe obesity (Class II) Increased incidence by 136%  >40 kg/m2 Extreme obesity (Class III) Increased incidence by 254%   Patient's current BMI Ideal Body weight  Body mass index is 39.63 kg/m. Ideal body weight: 45.5 kg (100 lb 4.9 oz) Adjusted ideal body weight: 64.1 kg (141 lb 5.5 oz)   BMI Readings from Last 4 Encounters:  03/18/18 39.63 kg/m  02/18/18 37.11 kg/m  01/26/18 38.08 kg/m  01/16/18 35.02 kg/m   Wt Readings from Last 4 Encounters:  03/18/18 202 lb 14.4 oz (92 kg)  02/18/18 190 lb (86.2 kg)  01/26/18 195 lb (88.5 kg)  01/16/18 204 lb (92.5 kg)

## 2018-04-08 ENCOUNTER — Ambulatory Visit: Payer: 59 | Admitting: Psychiatry

## 2018-04-08 NOTE — Progress Notes (Deleted)
[place] MD OP Progress Note  04/08/2018 12:44 PM Paiton BIONCA MCKEY  MRN:  [id number]  Chief Complaint:  HPI: Kriti is a 53 year old Caucasian female who is widowed, employed, lives in [place], has a history of coronary artery disease status post STEMI on 09/15/2017, status post stent placement, hyperlipidemia, chronic anemia, presented to clinic today for a follow-up visit.     Visit Diagnosis: No diagnosis found.  Past Psychiatric History: I have reviewed past psychiatric history from my progress note on 11/17/2017.  Past trials of Zoloft, Paxil, trazodone.     Past Medical History:  Past Medical History:  Diagnosis Date  . ADHD (attention deficit hyperactivity disorder)   . Anxiety   . B12 deficiency 02/07/2011  . Back pain   . CAD (coronary artery disease)    a. s/p recent STEMI on 09/15/2017 with DES to RCA  . Celiac disease   . Folic acid deficiency 03/10/107  . Headache   . Iron deficiency anemia 02/07/2011  . Myocardial infarction ([place]) 09/15/2017    Past Surgical History:  Procedure Laterality Date  . CHOLECYSTECTOMY    . COLONOSCOPY WITH PROPOFOL N/A 09/26/2017   Procedure: COLONOSCOPY WITH PROPOFOL;  Surgeon: Rogene Houston, MD;  Location: [facility];  Service: Endoscopy;  Laterality: N/A;  . CORONARY STENT INTERVENTION Right 09/15/2017   Procedure: CORONARY STENT INTERVENTION;  Surgeon: Jettie Booze, MD;  Location: [facility];  Service: Cardiovascular;  Laterality: Right;  RCA  . KNEE CARTILAGE SURGERY Right   . LEFT HEART CATH AND CORONARY ANGIOGRAPHY N/A 09/15/2017   Procedure: LEFT HEART CATH AND CORONARY ANGIOGRAPHY;  Surgeon: Jettie Booze, MD;  Location: [facility];  Service: Cardiovascular;  Laterality: N/A;  . TONSILLECTOMY      Family Psychiatric History: Reviewed family psychiatric history from my progress note on 11/17/2017.  Family History:  Family History  Problem Relation Age of Onset  . Cancer Mother   . Hypertension Mother    . Hypertension Father   . Hypothyroidism Father   . Cancer Sister   . Anxiety disorder Sister   . Depression Sister   . CAD Maternal Grandfather   . Cirrhosis Paternal Grandfather   . CAD Paternal Grandmother   . Diabetes Paternal Grandmother   . Hypothyroidism Paternal Grandmother   . Drug abuse Son   . Bipolar disorder Son     Social History: I have reviewed social history from my progress note on 11/17/2017. Social History   Socioeconomic History  . Marital status: Widowed    Spouse name: Not on file  . Number of children: 3  . Years of education: Not on file  . Highest education level: Associate degree: occupational, Hotel manager, or vocational program  Occupational History  . Not on file  Social Needs  . Financial resource strain: Not hard at all  . Food insecurity:    Worry: Never true    Inability: Never true  . Transportation needs:    Medical: No    Non-medical: No  Tobacco Use  . Smoking status: Former Smoker    Packs/day: 0.25    Years: 12.00    Pack years: 3.00    Types: Cigarettes    Last attempt to quit: 04/04/2017    Years since quitting: 1.0  . Smokeless tobacco: Never Used  Substance and Sexual Activity  . Alcohol use: No  . Drug use: No  . Sexual activity: Not Currently  Lifestyle  . Physical activity:  Days per week: 3 days    Minutes per session: 30 min  . Stress: Very much  Relationships  . Social connections:    Talks on phone: More than three times a week    Gets together: More than three times a week    Attends religious service: More than 4 times per year    Active member of club or organization: No    Attends meetings of clubs or organizations: Never    Relationship status: Widowed  Other Topics Concern  . Not on file  Social History Narrative  . Not on file    Allergies:  Allergies  Allergen Reactions  . Bee Venom Anaphylaxis  . Erythromycin Anaphylaxis  . Cephalexin   . Neosporin Original [Bacitracin-Neomycin-Polymyxin]  Swelling    Swelling at site of application  . Prednisone   . Sulfa Antibiotics Swelling    Sulfa eye drops caused eyes to swell  . Tobrex [Tobramycin] Swelling  . Tramadol Hives  . Penicillins Rash    Has patient had a PCN reaction causing immediate rash, facial/tongue/throat swelling, SOB or lightheadedness with hypotension: Yes Has patient had a PCN reaction causing severe rash involving mucus membranes or skin necrosis: No Has patient had a PCN reaction that required hospitalization: No Has patient had a PCN reaction occurring within the last 10 years: No If all of the above answers are "NO", then may proceed with Cephalosporin use.     Metabolic Disorder Labs: Lab Results  Component Value Date   HGBA1C 5.6 09/16/2017   MPG 114 09/16/2017   MPG 123 (H) 07/07/2013   No results found for: PROLACTIN Lab Results  Component Value Date   CHOL 77 09/16/2017   TRIG 64 09/16/2017   HDL 19 (L) 09/16/2017   CHOLHDL 4.1 09/16/2017   VLDL 13 09/16/2017   LDLCALC 45 09/16/2017   LDLCALC 97 07/07/2013   Lab Results  Component Value Date   TSH 2.471 07/07/2013   TSH 1.745 06/03/2012    Therapeutic Level Labs: No results found for: LITHIUM No results found for: VALPROATE No components found for:  CBMZ  Current Medications: Current Outpatient Medications  Medication Sig Dispense Refill  . aspirin 81 MG chewable tablet Chew 1 tablet (81 mg total) by mouth daily.    Marland Kitchen atorvastatin (LIPITOR) 40 MG tablet TAKE 1 TABLET BY MOUTH ONCE DAILY AT 6PM. 30 tablet 6  . baclofen (LIORESAL) 20 MG tablet Take 1 tablet (20 mg total) by mouth daily. 30 tablet 2  . cholecalciferol (VITAMIN D) 1000 units tablet Take 5,000 Units by mouth daily.    . clonazePAM (KLONOPIN) 0.5 MG tablet Take 0.5-1 tablets (0.25-0.5 mg total) by mouth as directed. Twice a day as needed for severe panic sx. 45 tablet 1  . clopidogrel (PLAVIX) 75 MG tablet TAKE 1 TABLET BY MOUTH ONCE DAILY. 30 tablet 6  .  Cyanocobalamin (VITAMIN B-12 IJ) Inject as directed every 30 (thirty) days.     . DULoxetine (CYMBALTA) 60 MG capsule Take 1 capsule (60 mg total) by mouth daily. 90 capsule 0  . famotidine (PEPCID) 20 MG tablet   3  . fluticasone (FLONASE) 50 MCG/ACT nasal spray Place into the nose.    . gabapentin (NEURONTIN) 300 MG capsule Take 3 capsules (900 mg total) by mouth at bedtime. 90 capsule 2  . lactobacillus acidophilus (BACID) TABS tablet Take 1 tablet by mouth daily.    Marland Kitchen levonorgestrel (MIRENA) 20 MCG/24HR IUD by Intrauterine route.    Marland Kitchen  metoprolol tartrate (LOPRESSOR) 25 MG tablet Take 1 tablet (25 mg total) by mouth 2 (two) times daily. 180 tablet 3  . mirtazapine (REMERON) 7.5 MG tablet Take 1 tablet (7.5 mg total) by mouth at bedtime. FOR sleep and mood 30 tablet 1  . nitroGLYCERIN (NITROSTAT) 0.4 MG SL tablet Place 1 tablet (0.4 mg total) under the tongue every 5 (five) minutes as needed for chest pain. 25 tablet 2  . nystatin (MYCOSTATIN/NYSTOP) powder Apply topically.    . ondansetron (ZOFRAN-ODT) 8 MG disintegrating tablet Take 1 tablet by mouth 3 (three) times daily as needed for nausea.   3  . oxyCODONE (OXY IR/ROXICODONE) 5 MG immediate release tablet Take 1 tablet (5 mg total) by mouth every 6 (six) hours as needed for up to 30 days for severe pain. Must last 30 days. 120 tablet 0  . pantoprazole (PROTONIX) 40 MG tablet TAKE 1 TABLET BY MOUTH ONCE DAILY BEFORE BREAKFAST. 30 tablet 6  . promethazine (PHENERGAN) 25 MG tablet Take 25 mg by mouth 3 (three) times daily as needed for nausea.   2  . torsemide (DEMADEX) 20 MG tablet   2   No current facility-administered medications for this visit.      Musculoskeletal: Strength & Muscle Tone: {desc; muscle tone:32375} Gait & Station: {PE GAIT ED EQAS:34196} Patient leans: {Patient Leans:21022755}  Psychiatric Specialty Exam: ROS  There were no vitals taken for this visit.There is no height or weight on file to calculate BMI.   General Appearance: {Appearance:22683}  Eye Contact:  {BHH EYE CONTACT:22684}  Speech:  {Speech:22685}  Volume:  {Volume (PAA):22686}  Mood:  {BHH MOOD:22306}  Affect:  {Affect (PAA):22687}  Thought Process:  {Thought Process (PAA):22688}  Orientation:  {BHH ORIENTATION (PAA):22689}  Thought Content: {Thought Content:22690}   Suicidal Thoughts:  {ST/HT (PAA):22692}  Homicidal Thoughts:  {ST/HT (PAA):22692}  Memory:  {BHH MEMORY:22881}  Judgement:  {Judgement (PAA):22694}  Insight:  {Insight (PAA):22695}  Psychomotor Activity:  {Psychomotor (PAA):22696}  Concentration:  {Concentration:21399}  Recall:  {BHH GOOD/FAIR/POOR:22877}  Fund of Knowledge: {BHH GOOD/FAIR/POOR:22877}  Language: {BHH GOOD/FAIR/POOR:22877}  Akathisia:  {BHH YES OR NO:22294}  Handed:  {Handed:22697}  AIMS (if indicated): {Desc; done/not:10129}  Assets:  {Assets (PAA):22698}  ADL's:  {BHH QIW'L:79892}  Cognition: {chl bhh cognition:304700322}  Sleep:  {BHH GOOD/FAIR/POOR:22877}   Screenings: PHQ2-9     Clinical Support from 03/18/2018 in [facility] Visit from 12/29/2017 in [facility]  PHQ-2 Total Score  0  0       Assessment and Plan:   Evamae is a 53 year old Caucasian female, widowed, lives in [place], employed, has a history of depression, anxiety, insomnia, hyperlipidemia, coronary artery disease, myocardial infarction status post stent placement, presented to clinic today for a follow-up visit.  Patient is biologically predisposed given her family history as well as her own health issues.  She also has psychosocial stressors of her son and substance abuse problems.  Plan Panic disorder Cymbalta 60 mg p.o. daily Klonopin 0.25  -0.5 mg p.o. twice daily PRN.  She uses it when she has severe anxiety attack and has been trying to limit use.  For GAD Cymbalta and Klonopin as prescribed  For MDD Cymbalta 60  mg p.o. daily  For insomnia Mirtazapine 7.5 mg p.o. nightly       Ursula Alert, MD 04/08/2018, 12:44 PM

## 2018-04-15 ENCOUNTER — Other Ambulatory Visit: Payer: Self-pay

## 2018-04-15 ENCOUNTER — Encounter: Payer: Self-pay | Admitting: Nurse Practitioner

## 2018-04-15 ENCOUNTER — Ambulatory Visit: Payer: 59 | Attending: Nurse Practitioner | Admitting: Nurse Practitioner

## 2018-04-15 VITALS — BP 120/69 | HR 68 | Temp 97.6°F | Resp 16 | Ht 60.0 in | Wt 196.0 lb

## 2018-04-15 DIAGNOSIS — Z79891 Long term (current) use of opiate analgesic: Secondary | ICD-10-CM | POA: Diagnosis present

## 2018-04-15 DIAGNOSIS — G8929 Other chronic pain: Secondary | ICD-10-CM | POA: Insufficient documentation

## 2018-04-15 DIAGNOSIS — M7918 Myalgia, other site: Secondary | ICD-10-CM | POA: Diagnosis not present

## 2018-04-15 DIAGNOSIS — M533 Sacrococcygeal disorders, not elsewhere classified: Secondary | ICD-10-CM | POA: Diagnosis present

## 2018-04-15 DIAGNOSIS — G894 Chronic pain syndrome: Secondary | ICD-10-CM | POA: Insufficient documentation

## 2018-04-15 DIAGNOSIS — M792 Neuralgia and neuritis, unspecified: Secondary | ICD-10-CM | POA: Diagnosis present

## 2018-04-15 DIAGNOSIS — M62830 Muscle spasm of back: Secondary | ICD-10-CM

## 2018-04-15 DIAGNOSIS — M5441 Lumbago with sciatica, right side: Secondary | ICD-10-CM | POA: Diagnosis not present

## 2018-04-15 MED ORDER — GABAPENTIN 300 MG PO CAPS: 900.0000 mg | ORAL_CAPSULE | Freq: Every day | ORAL | Status: DC

## 2018-04-15 MED ORDER — OXYCODONE HCL 5 MG PO TABS: 5.0000 mg | ORAL_TABLET | Freq: Four times a day (QID) | ORAL | 0 refills | Status: DC | PRN

## 2018-04-15 MED ORDER — BACLOFEN 20 MG PO TABS: 20.0000 mg | ORAL_TABLET | Freq: Every day | ORAL | 1 refills | Status: DC

## 2018-04-15 NOTE — Patient Instructions (Signed)
____________________________________________________________________________________________  Medication Rules  Purpose: To inform patients, and their family members, of our rules and regulations.  Applies to: All patients receiving prescriptions (written or electronic).  Pharmacy of record: Pharmacy where electronic prescriptions will be sent. If written prescriptions are taken to a different pharmacy, please inform the nursing staff. The pharmacy listed in the electronic medical record should be the one where you would like electronic prescriptions to be sent.  Electronic prescriptions: In compliance with the  Strengthen Opioid Misuse Prevention (STOP) Act of 2017 (Session Law 2017-74/H243), effective March 04, 2018, all controlled substances must be electronically prescribed. Calling prescriptions to the pharmacy will cease to exist.  Prescription refills: Only during scheduled appointments. Applies to all prescriptions.  NOTE: The following applies primarily to controlled substances (Opioid* Pain Medications).   Patient's responsibilities: 1. Pain Pills: Bring all pain pills to every appointment (except for procedure appointments). 2. Pill Bottles: Bring pills in original pharmacy bottle. Always bring the newest bottle. Bring bottle, even if empty. 3. Medication refills: You are responsible for knowing and keeping track of what medications you take and those you need refilled. The day before your appointment: write a list of all prescriptions that need to be refilled. The day of the appointment: give the list to the admitting nurse. Prescriptions will be written only during appointments. No prescriptions will be written on procedure days. If you forget a medication: it will not be "Called in", "Faxed", or "electronically sent". You will need to get another appointment to get these prescribed. No early refills. Do not call asking to have your prescription filled  early. 4. Prescription Accuracy: You are responsible for carefully inspecting your prescriptions before leaving our office. Have the discharge nurse carefully go over each prescription with you, before taking them home. Make sure that your name is accurately spelled, that your address is correct. Check the name and dose of your medication to make sure it is accurate. Check the number of pills, and the written instructions to make sure they are clear and accurate. Make sure that you are given enough medication to last until your next medication refill appointment. 5. Taking Medication: Take medication as prescribed. When it comes to controlled substances, taking less pills or less frequently than prescribed is permitted and encouraged. Never take more pills than instructed. Never take medication more frequently than prescribed.  6. Inform other Doctors: Always inform, all of your healthcare providers, of all the medications you take. 7. Pain Medication from other Providers: You are not allowed to accept any additional pain medication from any other Doctor or Healthcare provider. There are two exceptions to this rule. (see below) In the event that you require additional pain medication, you are responsible for notifying us, as stated below. 8. Medication Agreement: You are responsible for carefully reading and following our Medication Agreement. This must be signed before receiving any prescriptions from our practice. Safely store a copy of your signed Agreement. Violations to the Agreement will result in no further prescriptions. (Additional copies of our Medication Agreement are available upon request.) 9. Laws, Rules, & Regulations: All patients are expected to follow all Federal and State Laws, Statutes, Rules, & Regulations. Ignorance of the Laws does not constitute a valid excuse. The use of any illegal substances is prohibited. 10. Adopted CDC guidelines & recommendations: Target dosing levels will be  at or below 60 MME/day. Use of benzodiazepines** is not recommended.  Exceptions: There are only two exceptions to the rule of not   receiving pain medications from other Healthcare Providers. 1. Exception #1 (Emergencies): In the event of an emergency (i.e.: accident requiring emergency care), you are allowed to receive additional pain medication. However, you are responsible for: As soon as you are able, call our office [phone], at any time of the day or night, and leave a message stating your name, the date and nature of the emergency, and the name and dose of the medication prescribed. In the event that your call is answered by a member of our staff, make sure to document and save the date, time, and the name of the person that took your information.  2. Exception #2 (Planned Surgery): In the event that you are scheduled by another doctor or dentist to have any type of surgery or procedure, you are allowed (for a period no longer than 30 days), to receive additional pain medication, for the acute post-op pain. However, in this case, you are responsible for picking up a copy of our "Post-op Pain Management for Surgeons" handout, and giving it to your surgeon or dentist. This document is available at our office, and does not require an appointment to obtain it. Simply go to our office during business hours (Monday-Thursday from 8:00 AM to 4:00 PM) (Friday 8:00 AM to 12:00 Noon) or if you have a scheduled appointment with us, prior to your surgery, and ask for it by name. In addition, you will need to provide us with your name, name of your surgeon, type of surgery, and date of procedure or surgery.  *Opioid medications include: morphine, codeine, oxycodone, oxymorphone, hydrocodone, hydromorphone, meperidine, tramadol, tapentadol, buprenorphine, fentanyl, methadone. **Benzodiazepine medications include: diazepam (Valium), alprazolam (Xanax), clonazepam (Klonopine), lorazepam (Ativan), clorazepate  (Tranxene), chlordiazepoxide (Librium), estazolam (Prosom), oxazepam (Serax), temazepam (Restoril), triazolam (Halcion) (Last updated: 05/01/2017) ____________________________________________________________________________________________    

## 2018-04-15 NOTE — Progress Notes (Signed)
Nursing Pain Medication Assessment:  Safety precautions to be maintained throughout the outpatient stay will include: orient to surroundings, keep bed in low position, maintain call bell within reach at all times, provide assistance with transfer out of bed and ambulation.  Medication Inspection Compliance: Pill count conducted under aseptic conditions, in front of the patient. Neither the pills nor the bottle was removed from the patient's sight at any time. Once count was completed pills were immediately returned to the patient in their original bottle.  Medication: Oxycodone IR Pill/Patch Count: 88 of 120 pills remain Pill/Patch Appearance: Markings consistent with prescribed medication Bottle Appearance: Standard pharmacy container. Clearly labeled. Filled Date: 02 / 03 / 2020 Last Medication intake:  Today

## 2018-04-15 NOTE — Progress Notes (Signed)
Patient's Name: Sharon Lawrence  MRN: [id number]  Referring Provider: Vidal Schwalbe, MD  DOB: [birth year]  PCP: Vidal Schwalbe, MD  DOS: 04/15/2018  Note by: Dionisio David, NP  Service setting: Ambulatory outpatient  Specialty: Interventional Pain Management  Location: [facility] (AMB) Pain Management Facility    Patient type: Established   HPI  Reason for Visit: Sharon Lawrence is a 53 y.o. year old, female patient, who comes today with a chief complaint of Back Pain (lower) Last Appointment: Her last appointment at our practice was on 03/18/2018. I last saw her on 03/18/2018.  Pain Assessment: Today, [name] describes the severity of the Chronic pain as a 6 /10. She indicates the location/referral of the pain to be Back Lower/down back of right leg to knee. Onset was: More than a month ago. The quality of pain is described as Burning, Constant, Discomfort, Spasm. Temporal description, or timing of pain is: Constant. Possible modifying factors: meds, heating pad. [name]  height is 5' (1.524 m) and weight is 196 lb (88.9 kg). Her oral temperature is 97.6 F (36.4 C). Her blood pressure is 120/69 and her pulse is 68. Her respiration is 16 and oxygen saturation is 98%.  She admits that her pain is stable.  She continues to have numbness tingling and burning pain in her leg.  She does continue to do yoga as part of her treatment.  She denies any new concerns today.  She denies any side effects of her medication.  She does continue to take the melatonin for sleep which is effective.  She admits she will follow-up with cardiology in a few months.   Controlled Substance Pharmacotherapy Assessment REMS (Risk Evaluation and Mitigation Strategy)  Analgesic:Oxycodone IR 5 mg every 6 hours (20 mg/day of oxycodone) MME/day:71m/day. WRise Patience RN  04/15/2018  9:15 AM  Signed Nursing Pain Medication Assessment:  Safety precautions to be maintained throughout the outpatient stay will include: orient to  surroundings, keep bed in low position, maintain call bell within reach at all times, provide assistance with transfer out of bed and ambulation.  Medication Inspection Compliance: Pill count conducted under aseptic conditions, in front of the patient. Neither the pills nor the bottle was removed from the patient's sight at any time. Once count was completed pills were immediately returned to the patient in their original bottle.  Medication: Oxycodone IR Pill/Patch Count: 88 of 120 pills remain Pill/Patch Appearance: Markings consistent with prescribed medication Bottle Appearance: Standard pharmacy container. Clearly labeled. Filled Date: 02 / 03 / 2020 Last Medication intake:  Today   Pharmacokinetics: Liberation and absorption (onset of action): WNL Distribution (time to peak effect): WNL Metabolism and excretion (duration of action): WNL         Pharmacodynamics: Desired effects: Analgesia: Ms. CEmbreereports >50% benefit. Functional ability: Patient reports that medication allows her to accomplish basic ADLs Clinically meaningful improvement in function (CMIF): Sustained CMIF goals met Perceived effectiveness: Described as relatively effective, allowing for increase in activities of daily living (ADL) Undesirable effects: Side-effects or Adverse reactions: None reported Monitoring: [place] PMP: Online review of the past 121-montheriod conducted. Compliant with practice rules and regulations Last UDS on record: Summary  Date Value Ref Range Status  12/03/2017 FINAL  Final    Comment:    ==================================================================== [facility] COMP DRUG ANALYSIS,UR ==================================================================== Test  Result       Flag       Units Drug Present and Declared for Prescription Verification   Oxycodone                      1649         EXPECTED   ng/mg creat   Oxymorphone                    1737          EXPECTED   ng/mg creat   Noroxycodone                   629          EXPECTED   ng/mg creat   Noroxymorphone                 220          EXPECTED   ng/mg creat    Sources of oxycodone are scheduled prescription medications.    Oxymorphone, noroxycodone, and noroxymorphone are expected    metabolites of oxycodone. Oxymorphone is also available as a    scheduled prescription medication.   Gabapentin                     PRESENT      EXPECTED   Trazodone                      PRESENT      EXPECTED   Promethazine                   PRESENT      EXPECTED   Metoprolol                     PRESENT      EXPECTED Drug Present not Declared for Prescription Verification   Acetaminophen                  PRESENT      UNEXPECTED Drug Absent but Declared for Prescription Verification   Clonazepam                     Not Detected UNEXPECTED ng/mg creat   Baclofen                       Not Detected UNEXPECTED   Duloxetine                     Not Detected UNEXPECTED   Salicylate                     Not Detected UNEXPECTED    Aspirin, as indicated in the declared medication list, is not    always detected even when used as directed. ==================================================================== Test                      Result    Flag   Units      Ref Range   Creatinine              252              mg/dL      >=20 ==================================================================== Declared Medications:  The flagging and interpretation on this report are based on the  following declared medications.  Unexpected results may arise from  inaccuracies in the declared  medications.  **Note: The testing scope of this panel includes these medications:  Baclofen (Lioresal)  Clonazepam (Klonopin)  Duloxetine (Cymbalta)  Gabapentin  Metoprolol  Oxycodone  Promethazine  Trazodone  **Note: The testing scope of this panel does not include small to  moderate amounts of these reported medications:  Aspirin   **Note: The testing scope of this panel does not include following  reported medications:  Atorvastatin (Lipitor)  Clopidogrel (Plavix)  Fluticasone (Flonase)  Hydrochlorothiazide  Levonorgestrel  Nitroglycerin  Nystatin  Ondansetron  Pantoprazole  Torsemide  Vitamin D ==================================================================== For clinical consultation, please call [phone]. ====================================================================    UDS interpretation: Compliant          Medication Assessment Form: Reviewed. Patient indicates being compliant with therapy Treatment compliance: Compliant Risk Assessment Profile: Aberrant behavior: See initial evaluations. None observed or detected today Comorbid factors increasing risk of overdose: See initial evaluation. No additional risks detected today Opioid risk tool (ORT):  Opioid Risk  04/15/2018  Alcohol 1  Illegal Drugs 2  Rx Drugs 0  Alcohol 0  Illegal Drugs 0  Rx Drugs 0  Age between 16-45 years  0  History of Preadolescent Sexual Abuse 0  Psychological Disease 2  ADD Positive  OCD -  Bipolar -  Depression 1  Opioid Risk Tool Scoring 6  Opioid Risk Interpretation Moderate Risk    ORT Scoring interpretation table:  Score <3 = Low Risk for SUD  Score between 4-7 = Moderate Risk for SUD  Score >8 = High Risk for Opioid Abuse   Risk of substance use disorder (SUD): Moderate  Risk Mitigation Strategies:  Patient Counseling: Covered Patient-Prescriber Agreement (PPA): Present and active  Notification to other healthcare providers: Done  Pharmacologic Plan: No change in therapy, at this time.             ROS  Constitutional: Denies any fever or chills Gastrointestinal: No reported hemesis, hematochezia, vomiting, or acute GI distress Musculoskeletal: Denies any acute onset joint swelling, redness, loss of ROM, or weakness Neurological: No reported episodes of acute onset apraxia, aphasia,  dysarthria, agnosia, amnesia, paralysis, loss of coordination, or loss of consciousness  Medication Review  Cyanocobalamin, DULoxetine, aspirin, atorvastatin, baclofen, cholecalciferol, clonazePAM, clopidogrel, famotidine, fluticasone, gabapentin, lactobacillus acidophilus, levonorgestrel, metoprolol tartrate, nitroGLYCERIN, nystatin, ondansetron, oxyCODONE, pantoprazole, promethazine, and torsemide  History Review  Allergy: Ms. Weinheimer is allergic to bee venom; erythromycin; cephalexin; neosporin original [bacitracin-neomycin-polymyxin]; prednisone; sulfa antibiotics; tobrex [tobramycin]; tramadol; and penicillins. Drug: Ms. Locascio  reports no history of drug use. Alcohol:  reports no history of alcohol use. Tobacco:  reports that she quit smoking about a year ago. Her smoking use included cigarettes. She has a 3.00 pack-year smoking history. She has never used smokeless tobacco. Social: Ms. Hemrick  reports that she quit smoking about a year ago. Her smoking use included cigarettes. She has a 3.00 pack-year smoking history. She has never used smokeless tobacco. She reports that she does not drink alcohol or use drugs. Medical:  has a past medical history of ADHD (attention deficit hyperactivity disorder), Anxiety, B12 deficiency (02/07/2011), Back pain, CAD (coronary artery disease), Celiac disease, Folic acid deficiency (02/07/2011), Headache, Iron deficiency anemia (02/07/2011), and Myocardial infarction ([place]) (09/15/2017). Surgical: Ms. Rizo  has a past surgical history that includes Tonsillectomy; Cholecystectomy; Knee cartilage surgery (Right); LEFT HEART CATH AND CORONARY ANGIOGRAPHY (N/A, 09/15/2017); CORONARY STENT INTERVENTION (Right, 09/15/2017); and Colonoscopy with propofol (N/A, 09/26/2017). Family: family history includes Anxiety disorder in her sister; Bipolar disorder in her son;  CAD in her maternal grandfather and paternal grandmother; Cancer in her mother and sister; Cirrhosis in her paternal  grandfather; Depression in her sister; Diabetes in her paternal grandmother; Drug abuse in her son; Hypertension in her father and mother; Hypothyroidism in her father and paternal grandmother. Problem List: Ms. Colan has Chronic low back pain (Primary Area of Pain) (Bilateral) (R>L) w/ sciatica (Right); Chronic lower extremity pain (Secondary Area of Pain) (Right); Chronic sacroiliac joint pain (Bilateral) (L>R); Chronic pain syndrome; and Long term current use of opiate analgesic on their pertinent problem list.  Lab Review  Kidney Function Lab Results  Component Value Date   BUN 12 12/31/2017   CREATININE 0.76 12/31/2017   GFRAA >60 12/31/2017   GFRNONAA >60 12/31/2017  Liver Function Lab Results  Component Value Date   AST 29 12/31/2017   ALT 50 (H) 12/31/2017   ALBUMIN 3.4 (L) 12/31/2017  Note: Above Lab results reviewed.  Imaging Review  DG Chest 2 View CLINICAL DATA:  Chest pain.  EXAM: CHEST - 2 VIEW  COMPARISON:  Radiograph September 15, 2017.  FINDINGS: The heart size and mediastinal contours are within normal limits. Both lungs are clear. No pneumothorax or pleural effusion is noted. The visualized skeletal structures are unremarkable.  IMPRESSION: No active cardiopulmonary disease.  Electronically Signed   By: Marijo Conception, M.D.   On: 12/31/2017 19:05 Note: Above imaging results reviewed.        Physical Exam  General appearance: Well nourished, well developed, and well hydrated. In no apparent acute distress Mental status: Alert, oriented x 3 (person, place, & time)       Respiratory: No evidence of acute respiratory distress Eyes: PERLA Vitals: BP 120/69   Pulse 68   Temp 97.6 F (36.4 C) (Oral)   Resp 16   Ht 5' (1.524 m)   Wt 196 lb (88.9 kg)   SpO2 98%   BMI 38.28 kg/m  BMI: Estimated body mass index is 38.28 kg/m as calculated from the following:   Height as of this encounter: 5' (1.524 m).   Weight as of this encounter: 196 lb (88.9  kg). Ideal: Ideal body weight: 45.5 kg (100 lb 4.9 oz) Adjusted ideal body weight: 62.9 kg (138 lb 9.4 oz) Lumbar Spine Area Exam  Skin & Axial Inspection: No masses, redness, or swelling Alignment: Symmetrical Functional ROM: Unrestricted ROM       Stability: No instability detected Muscle Tone/Strength: Functionally intact. No obvious neuro-muscular anomalies detected. Sensory (Neurological): Unimpaired Palpation: Tender        * Upper Extremity (UE) Exam    Side: Right upper extremity  Side: Left upper extremity  Skin & Extremity Inspection: Skin color, temperature, and hair growth are WNL. No peripheral edema or cyanosis. No masses, redness, swelling, asymmetry, or associated skin lesions. No contractures.  Skin & Extremity Inspection: Skin color, temperature, and hair growth are WNL. No peripheral edema or cyanosis. No masses, redness, swelling, asymmetry, or associated skin lesions. No contractures.  Functional ROM: Unrestricted ROM          Functional ROM: Unrestricted ROM          Muscle Tone/Strength: Functionally intact. No obvious neuro-muscular anomalies detected.  Muscle Tone/Strength: Functionally intact. No obvious neuro-muscular anomalies detected.  Sensory (Neurological): Referred pain pattern          Sensory (Neurological): Unimpaired          Palpation: No palpable anomalies  Palpation: No palpable anomalies                   Assessment   Status Diagnosis  Controlled Controlled Controlled 1. Chronic low back pain (Primary Area of Pain) (Bilateral) (R>L) w/ sciatica (Right)   2. Chronic sacroiliac joint pain (Bilateral) (L>R)   3. Back muscle spasm   4. Chronic musculoskeletal pain   5. Neurogenic pain   6. Chronic pain syndrome   7. Long term prescription opiate use      Updated Problems: No problems updated.  Plan of Care  Medications: I have discontinued Akua K. Splitt's mirtazapine. I am also having her start on oxyCODONE. Additionally, I am  having her maintain her cholecalciferol, Cyanocobalamin (VITAMIN B-12 IJ), promethazine, ondansetron, aspirin, nitroGLYCERIN, pantoprazole, atorvastatin, torsemide, levonorgestrel, nystatin, fluticasone, lactobacillus acidophilus, famotidine, DULoxetine, clonazePAM, metoprolol tartrate, clopidogrel, baclofen, gabapentin, and oxyCODONE.  Administered today: Mollye K. Pentland had no medications administered during this visit.  Orders:  Orders Placed This Encounter  Procedures  . Tox[employer] Select 13 (MW), Urine    Volume: 30 ml(s). Minimum 3 ml of urine is needed. Document temperature of fresh sample. Indications: Long term (current) use of opiate analgesic (X77.414)   Interventional management options: Planned, scheduled, and/or pending: No blocks until after July 15th, 2020due to MI and blood thinners.   Considering: Diagnostic bilateral lumbar facet nerve block Possible bilateral lumbar facet radiofrequency ablation Diagnostic sacroiliac joint nerve block Possible sacroiliac joint radiofrequency ablation   PRN Procedures: None at this time    Note by: Dionisio David, NP Date: 04/15/2018; Time: 1:40 PM

## 2018-04-21 LAB — TOXASSURE SELECT 13 (MW), URINE

## 2018-04-29 ENCOUNTER — Other Ambulatory Visit: Payer: Self-pay

## 2018-04-29 ENCOUNTER — Ambulatory Visit: Payer: 59 | Admitting: Psychiatry

## 2018-04-29 ENCOUNTER — Encounter: Payer: Self-pay | Admitting: Psychiatry

## 2018-04-29 VITALS — BP 109/72 | HR 86 | Temp 98.7°F | Ht 60.0 in

## 2018-04-29 DIAGNOSIS — F5105 Insomnia due to other mental disorder: Secondary | ICD-10-CM

## 2018-04-29 DIAGNOSIS — F411 Generalized anxiety disorder: Secondary | ICD-10-CM

## 2018-04-29 DIAGNOSIS — F330 Major depressive disorder, recurrent, mild: Secondary | ICD-10-CM

## 2018-04-29 DIAGNOSIS — F410 Panic disorder [episodic paroxysmal anxiety] without agoraphobia: Secondary | ICD-10-CM | POA: Diagnosis not present

## 2018-04-29 MED ORDER — DULOXETINE HCL 60 MG PO CPEP: 60.0000 mg | ORAL_CAPSULE | Freq: Every day | ORAL | 0 refills | Status: DC

## 2018-04-29 NOTE — Progress Notes (Signed)
[facility] MD OP Progress Note  04/29/2018 1:25 PM Sharon Lawrence  MRN:  [id number]  Chief Complaint: ' I am here for follow up." Chief Complaint    Follow-up     HPI: Sharon Lawrence is a 53 yr old Caucasian female who is widowed, employed as a Marine scientist, lives in [place], has a history of coronary artery disease status post STEMI on 09/15/2017, status post stent placement, hyperlipidemia, chronic anemia, presented to clinic today for a follow-up visit.  Patient today reports she continues to have psychosocial stressors of her son who is currently struggling with legal issues as well as substance abuse problems.  She however continues to be supportive.  She reports she is happy that he is making progress.  Patient reports May 23, 2022 was a death anniversary of her husband who passed away several years ago.  Patient however reports certain situations continues to trigger memories and she goes into a panic mood.  Patient however reports she has been able to better cope with her panic symptoms.  She continues to be compliant with the Cymbalta.  She continues to limit the use of Klonopin and uses it only for emergencies.  Patient reports she did not take the mirtazapine which was prescribed for sleep last visit.  She reports she was afraid of the side effects.  Patient reports she would like to go back on the melatonin.  Patient to continue psychotherapy sessions which are beneficial. Visit Diagnosis:    ICD-10-CM   1. Panic disorder F41.0 DULoxetine (CYMBALTA) 60 MG capsule  2. GAD (generalized anxiety disorder) F41.1 DULoxetine (CYMBALTA) 60 MG capsule  3. MDD (major depressive disorder), recurrent episode, mild (HCC) F33.0 DULoxetine (CYMBALTA) 60 MG capsule  4. Insomnia due to mental disorder F51.05     Past Psychiatric History: I have reviewed past psychiatric history from my progress note on 11/17/2017.  Past trials of Zoloft, Paxil, trazodone  Past Medical History:  Past Medical History:  Diagnosis Date  .  ADHD (attention deficit hyperactivity disorder)   . Anxiety   . B12 deficiency 02/07/2011  . Back pain   . CAD (coronary artery disease)    a. s/p recent STEMI on 09/15/2017 with DES to RCA  . Celiac disease   . Folic acid deficiency 42/10/7679  . Headache   . Iron deficiency anemia 02/07/2011  . Myocardial infarction ([place]) 09/15/2017    Past Surgical History:  Procedure Laterality Date  . CHOLECYSTECTOMY    . COLONOSCOPY WITH PROPOFOL N/A 09/26/2017   Procedure: COLONOSCOPY WITH PROPOFOL;  Surgeon: Rogene Houston, MD;  Location: [facility];  Service: Endoscopy;  Laterality: N/A;  . CORONARY STENT INTERVENTION Right 09/15/2017   Procedure: CORONARY STENT INTERVENTION;  Surgeon: Jettie Booze, MD;  Location: [facility];  Service: Cardiovascular;  Laterality: Right;  RCA  . KNEE CARTILAGE SURGERY Right   . LEFT HEART CATH AND CORONARY ANGIOGRAPHY N/A 09/15/2017   Procedure: LEFT HEART CATH AND CORONARY ANGIOGRAPHY;  Surgeon: Jettie Booze, MD;  Location: [facility];  Service: Cardiovascular;  Laterality: N/A;  . TONSILLECTOMY      Family Psychiatric History: Reviewed family psychiatric history from my progress note on 11/17/2017  Family History:  Family History  Problem Relation Age of Onset  . Cancer Mother   . Hypertension Mother   . Hypertension Father   . Hypothyroidism Father   . Cancer Sister   . Anxiety disorder Sister   . Depression Sister   . CAD  Maternal Grandfather   . Cirrhosis Paternal Grandfather   . CAD Paternal Grandmother   . Diabetes Paternal Grandmother   . Hypothyroidism Paternal Grandmother   . Drug abuse Son   . Bipolar disorder Son     Social History: Reviewed social history from my progress note on 11/17/2017 Social History   Socioeconomic History  . Marital status: Widowed    Spouse name: Not on file  . Number of children: 3  . Years of education: Not on file  . Highest education level: Associate degree:  occupational, Hotel manager, or vocational program  Occupational History  . Not on file  Social Needs  . Financial resource strain: Not hard at all  . Food insecurity:    Worry: Never true    Inability: Never true  . Transportation needs:    Medical: No    Non-medical: No  Tobacco Use  . Smoking status: Former Smoker    Packs/day: 0.25    Years: 12.00    Pack years: 3.00    Types: Cigarettes    Last attempt to quit: 04/04/2017    Years since quitting: 1.0  . Smokeless tobacco: Never Used  Substance and Sexual Activity  . Alcohol use: No  . Drug use: No  . Sexual activity: Not Currently  Lifestyle  . Physical activity:    Days per week: 3 days    Minutes per session: 30 min  . Stress: Very much  Relationships  . Social connections:    Talks on phone: More than three times a week    Gets together: More than three times a week    Attends religious service: More than 4 times per year    Active member of club or organization: No    Attends meetings of clubs or organizations: Never    Relationship status: Widowed  Other Topics Concern  . Not on file  Social History Narrative  . Not on file    Allergies:  Allergies  Allergen Reactions  . Bee Venom Anaphylaxis  . Erythromycin Anaphylaxis  . Cephalexin   . Neosporin Original [Bacitracin-Neomycin-Polymyxin] Swelling    Swelling at site of application  . Prednisone   . Sulfa Antibiotics Swelling    Sulfa eye drops caused eyes to swell  . Tobrex [Tobramycin] Swelling  . Tramadol Hives  . Penicillins Rash    Has patient had a PCN reaction causing immediate rash, facial/tongue/throat swelling, SOB or lightheadedness with hypotension: Yes Has patient had a PCN reaction causing severe rash involving mucus membranes or skin necrosis: No Has patient had a PCN reaction that required hospitalization: No Has patient had a PCN reaction occurring within the last 10 years: No If all of the above answers are "NO", then may proceed with  Cephalosporin use.     Metabolic Disorder Labs: Lab Results  Component Value Date   HGBA1C 5.6 09/16/2017   MPG 114 09/16/2017   MPG 123 (H) 07/07/2013   No results found for: PROLACTIN Lab Results  Component Value Date   CHOL 77 09/16/2017   TRIG 64 09/16/2017   HDL 19 (L) 09/16/2017   CHOLHDL 4.1 09/16/2017   VLDL 13 09/16/2017   LDLCALC 45 09/16/2017   LDLCALC 97 07/07/2013   Lab Results  Component Value Date   TSH 2.471 07/07/2013   TSH 1.745 06/03/2012    Therapeutic Level Labs: No results found for: LITHIUM No results found for: VALPROATE No components found for:  CBMZ  Current Medications: Current Outpatient Medications  Medication Sig Dispense Refill  . aspirin 81 MG chewable tablet Chew 1 tablet (81 mg total) by mouth daily.    Marland Kitchen atorvastatin (LIPITOR) 40 MG tablet TAKE 1 TABLET BY MOUTH ONCE DAILY AT 6PM. 30 tablet 6  . [START ON 05/04/2018] baclofen (LIORESAL) 20 MG tablet Take 1 tablet (20 mg total) by mouth daily. 30 tablet 1  . cholecalciferol (VITAMIN D) 1000 units tablet Take 5,000 Units by mouth daily.    . clonazePAM (KLONOPIN) 0.5 MG tablet Take 0.5-1 tablets (0.25-0.5 mg total) by mouth as directed. Twice a day as needed for severe panic sx. 45 tablet 1  . clopidogrel (PLAVIX) 75 MG tablet TAKE 1 TABLET BY MOUTH ONCE DAILY. 30 tablet 6  . Cyanocobalamin (VITAMIN B-12 IJ) Inject as directed every 30 (thirty) days.     . DULoxetine (CYMBALTA) 60 MG capsule Take 1 capsule (60 mg total) by mouth daily. 90 capsule 0  . famotidine (PEPCID) 20 MG tablet   3  . famotidine (PEPCID) 40 MG tablet     . fluticasone (FLONASE) 50 MCG/ACT nasal spray Place into the nose.    Derrill Memo ON 05/04/2018] gabapentin (NEURONTIN) 300 MG capsule Take 3 capsules (900 mg total) by mouth at bedtime. 90 capsule 01  . ibuprofen (ADVIL,MOTRIN) 800 MG tablet     . lactobacillus acidophilus (BACID) TABS tablet Take 1 tablet by mouth daily.    Marland Kitchen levonorgestrel (MIRENA) 20 MCG/24HR IUD  by Intrauterine route.    . metoprolol tartrate (LOPRESSOR) 25 MG tablet Take 1 tablet (25 mg total) by mouth 2 (two) times daily. 180 tablet 3  . nitroGLYCERIN (NITROSTAT) 0.4 MG SL tablet Place 1 tablet (0.4 mg total) under the tongue every 5 (five) minutes as needed for chest pain. 25 tablet 2  . nystatin (MYCOSTATIN/NYSTOP) powder Apply topically.    . ondansetron (ZOFRAN-ODT) 8 MG disintegrating tablet Take 1 tablet by mouth 3 (three) times daily as needed for nausea.   3  . [START ON 05/04/2018] oxyCODONE (OXY IR/ROXICODONE) 5 MG immediate release tablet Take 1 tablet (5 mg total) by mouth every 6 (six) hours as needed for up to 30 days for severe pain. Must last 30 days. 120 tablet 0  . [START ON 06/03/2018] oxyCODONE (OXY IR/ROXICODONE) 5 MG immediate release tablet Take 1 tablet (5 mg total) by mouth every 6 (six) hours as needed for up to 30 days for severe pain. 120 tablet 0  . pantoprazole (PROTONIX) 40 MG tablet TAKE 1 TABLET BY MOUTH ONCE DAILY BEFORE BREAKFAST. 30 tablet 6  . promethazine (PHENERGAN) 25 MG tablet Take 25 mg by mouth 3 (three) times daily as needed for nausea.   2  . torsemide (DEMADEX) 20 MG tablet   2   No current facility-administered medications for this visit.      Musculoskeletal: Strength & Muscle Tone: within normal limits Gait & Station: normal Patient leans: N/A  Psychiatric Specialty Exam: Review of Systems  Psychiatric/Behavioral: The patient is nervous/anxious.   All other systems reviewed and are negative.   Blood pressure 109/72, pulse 86, temperature 98.7 F (37.1 C), temperature source Oral, height 5' (1.524 m).Body mass index is 38.28 kg/m.  General Appearance: Casual  Eye Contact:  Fair  Speech:  Clear and Coherent  Volume:  Normal  Mood:  Anxious  Affect:  Appropriate  Thought Process:  Goal Directed and Descriptions of Associations: Intact  Orientation:  Full (Time, Place, and Person)  Thought Content: Logical   Suicidal  Thoughts:   No  Homicidal Thoughts:  No  Memory:  Immediate;   Fair Recent;   Fair Remote;   Fair  Judgement:  Fair  Insight:  Fair  Psychomotor Activity:  Normal  Concentration:  Concentration: Fair and Attention Span: Fair  Recall:  [employer] of Knowledge: Fair  Language: Fair  Akathisia:  No  Handed:  Right  AIMS (if indicated): denies tremors  Assets:  Communication Skills Desire for Improvement Social Support  ADL's:  Intact  Cognition: WNL  Sleep:  restless   Screenings: PHQ2-9     Office Visit from 04/15/2018 in [facility] Clinical Support from 03/18/2018 in [facility] Office Visit from 12/29/2017 in [facility]  PHQ-2 Total Score  0  0  0       Assessment and Plan: Kamilia is a 53 year old Caucasian female, widowed, lives in [place], has a history of depression, anxiety, insomnia, hyperlipidemia, coronary artery disease, myocardial infarction status post stent placement, presented to clinic today for a follow-up visit.  Patient is biologically predisposed given her family history as well as her own health issues.  She also has psychosocial stressors of her son's substance abuse problems.  Patient continues to struggle with some mood symptoms and anxiety attacks.  She however is making progress.   Panic disorder-improving Cymbalta 60 mg p.o. daily Klonopin 0.25 2.5 mg as needed.  She has been limiting use.  For GAD-improving Cymbalta and Klonopin as prescribed  For MDD-improving Cymbalta 60 mg p.o. daily Continue CBT  For insomnia Discontinue mirtazapine due to patient preference. Start melatonin as needed  Patient to follow-up in clinic in 2 months or sooner if needed.  I have spent atleast 15 minutes face to face with patient today. More than 50 % of the time was spent for psychoeducation and supportive psychotherapy and care  coordination.  This note was generated in part or whole with voice recognition software. Voice recognition is usually quite accurate but there are transcription errors that can and very often do occur. I apologize for any typographical errors that were not detected and corrected.        Ursula Alert, MD 04/29/2018, 1:25 PM

## 2018-05-27 ENCOUNTER — Ambulatory Visit: Payer: 59 | Admitting: Psychiatry

## 2018-06-08 ENCOUNTER — Other Ambulatory Visit: Payer: Self-pay

## 2018-06-08 ENCOUNTER — Ambulatory Visit (INDEPENDENT_AMBULATORY_CARE_PROVIDER_SITE_OTHER): Payer: Self-pay | Admitting: Psychiatry

## 2018-06-08 ENCOUNTER — Encounter: Payer: Self-pay | Admitting: Psychiatry

## 2018-06-08 DIAGNOSIS — F410 Panic disorder [episodic paroxysmal anxiety] without agoraphobia: Secondary | ICD-10-CM

## 2018-06-08 DIAGNOSIS — F411 Generalized anxiety disorder: Secondary | ICD-10-CM

## 2018-06-08 DIAGNOSIS — F330 Major depressive disorder, recurrent, mild: Secondary | ICD-10-CM

## 2018-06-08 DIAGNOSIS — F5105 Insomnia due to other mental disorder: Secondary | ICD-10-CM

## 2018-06-08 MED ORDER — CLONAZEPAM 0.5 MG PO TABS: 0.2500 mg | ORAL_TABLET | ORAL | 1 refills | Status: DC

## 2018-06-08 NOTE — Progress Notes (Signed)
Virtual Visit via Video Note  I connected with Sharon Lawrence on 06/08/18 at  1:00 PM EDT by a video enabled telemedicine application and verified that I am speaking with the correct person using two identifiers.   I discussed the limitations of evaluation and management by telemedicine and the availability of in person appointments. The patient expressed understanding and agreed to proceed.   I discussed the assessment and treatment plan with the patient. The patient was provided an opportunity to ask questions and all were answered. The patient agreed with the plan and demonstrated an understanding of the instructions.   The patient was advised to call back or seek an in-person evaluation if the symptoms worsen or if the condition fails to improve as anticipated.  I provided 15 minutes of non-face-to-face time during this encounter.   Ursula Alert, MD  [facility] MD  OP Progress Note  06/08/2018 1:44 PM Vonceil ALEXZANDRA BILTON  MRN:  [id number]  Chief Complaint:  Chief Complaint    Follow-up; Medication Refill     HPI: Sharon Lawrence is a 53 year old Caucasian female who is widowed, employed as a Marine scientist, lives in [place], has a history of coronary artery disease status post STEMI on 09/15/2017, status post stent placement, hyperlipidemia, chronic anemia was evaluated by telemedicine.  Patient today reports she continues to work.  She has been trying to make use of precautions per recommendations as much as possible to avoid getting the coronavirus.  She reports they have a separate place for patient showing respiratory symptoms.  Patient reports she continues to make use of Klonopin however has been using it 1-2 times a week or so for panic attacks.  She has a history of severe panic symptoms affecting her ability to stay at work.  She is aware about the risk of being on benzodiazepine therapy long-term and is restricting use.  Patient reports she is compliant on her Cymbalta.  She denies any side  effects.  Patient denies any suicidality, homicidality or perceptual disturbances.  Patient continues to be in psychotherapy with Ms. Alden Hipp and is motivated to schedule an appointment with her soon.  Patient denies any other concerns today. Visit Diagnosis:    ICD-10-CM   1. Panic disorder F41.0 clonazePAM (KLONOPIN) 0.5 MG tablet  2. GAD (generalized anxiety disorder) F41.1 clonazePAM (KLONOPIN) 0.5 MG tablet  3. MDD (major depressive disorder), recurrent episode, mild ([place]) F33.0   4. Insomnia due to mental disorder F51.05     Past Psychiatric History: Reviewed past psychiatric history from my progress note on 11/17/2017.  Past trials of Zoloft, Paxil, trazodone  Past Medical History:  Past Medical History:  Diagnosis Date  . ADHD (attention deficit hyperactivity disorder)   . Anxiety   . B12 deficiency 02/07/2011  . Back pain   . CAD (coronary artery disease)    a. s/p recent STEMI on 09/15/2017 with DES to RCA  . Celiac disease   . Folic acid deficiency 53/08/1441  . Headache   . Iron deficiency anemia 02/07/2011  . Myocardial infarction ([place]) 09/15/2017    Past Surgical History:  Procedure Laterality Date  . CHOLECYSTECTOMY    . COLONOSCOPY WITH PROPOFOL N/A 09/26/2017   Procedure: COLONOSCOPY WITH PROPOFOL;  Surgeon: Rogene Houston, MD;  [facility];  Service: Endoscopy;  Laterality: N/A;  . CORONARY STENT INTERVENTION Right 09/15/2017   Procedure: CORONARY STENT INTERVENTION;  Surgeon: Jettie Booze, MD;  Location: [place] [facility];  Service: Cardiovascular;  Laterality:  Right;  RCA  . KNEE CARTILAGE SURGERY Right   . LEFT HEART CATH AND CORONARY ANGIOGRAPHY N/A 09/15/2017   Procedure: LEFT HEART CATH AND CORONARY ANGIOGRAPHY;  Surgeon: Jettie Booze, MD;  Location: [place] [facility];  Service: Cardiovascular;  Laterality: N/A;  . TONSILLECTOMY      Family Psychiatric History: I have reviewed family psychiatric history from my progress  note on 11/17/2017  Family History:  Family History  Problem Relation Age of Onset  . Cancer Mother   . Hypertension Mother   . Hypertension Father   . Hypothyroidism Father   . Cancer Sister   . Anxiety disorder Sister   . Depression Sister   . CAD Maternal Grandfather   . Cirrhosis Paternal Grandfather   . CAD Paternal Grandmother   . Diabetes Paternal Grandmother   . Hypothyroidism Paternal Grandmother   . Drug abuse Son   . Bipolar disorder Son     Social History: Reviewed social history from my progress note on 11/17/2017. Social History   Socioeconomic History  . Marital status: Widowed    Spouse name: Not on file  . Number of children: 3  . Years of education: Not on file  . Highest education level: Associate degree: occupational, Hotel manager, or vocational program  Occupational History  . Not on file  Social Needs  . Financial resource strain: Not hard at all  . Food insecurity:    Worry: Never true    Inability: Never true  . Transportation needs:    Medical: No    Non-medical: No  Tobacco Use  . Smoking status: Former Smoker    Packs/day: 0.25    Years: 12.00    Pack years: 3.00    Types: Cigarettes    Last attempt to quit: 04/04/2017    Years since quitting: 1.1  . Smokeless tobacco: Never Used  Substance and Sexual Activity  . Alcohol use: No  . Drug use: No  . Sexual activity: Not Currently  Lifestyle  . Physical activity:    Days per week: 3 days    Minutes per session: 30 min  . Stress: Very much  Relationships  . Social connections:    Talks on phone: More than three times a week    Gets together: More than three times a week    Attends religious service: More than 4 times per year    Active member of club or organization: No    Attends meetings of clubs or organizations: Never    Relationship status: Widowed  Other Topics Concern  . Not on file  Social History Narrative  . Not on file    Allergies:  Allergies  Allergen Reactions  .  Bee Venom Anaphylaxis  . Erythromycin Anaphylaxis  . Cephalexin   . Neosporin Original [Bacitracin-Neomycin-Polymyxin] Swelling    Swelling at site of application  . Prednisone   . Sulfa Antibiotics Swelling    Sulfa eye drops caused eyes to swell  . Tobrex [Tobramycin] Swelling  . Tramadol Hives  . Penicillins Rash    Has patient had a PCN reaction causing immediate rash, facial/tongue/throat swelling, SOB or lightheadedness with hypotension: Yes Has patient had a PCN reaction causing severe rash involving mucus membranes or skin necrosis: No Has patient had a PCN reaction that required hospitalization: No Has patient had a PCN reaction occurring within the last 10 years: No If all of the above answers are "NO", then may proceed with Cephalosporin use.  Metabolic Disorder Labs: Lab Results  Component Value Date   HGBA1C 5.6 09/16/2017   MPG 114 09/16/2017   MPG 123 (H) 07/07/2013   No results found for: PROLACTIN Lab Results  Component Value Date   CHOL 77 09/16/2017   TRIG 64 09/16/2017   HDL 19 (L) 09/16/2017   CHOLHDL 4.1 09/16/2017   VLDL 13 09/16/2017   LDLCALC 45 09/16/2017   LDLCALC 97 07/07/2013   Lab Results  Component Value Date   TSH 2.471 07/07/2013   TSH 1.745 06/03/2012    Therapeutic Level Labs: No results found for: LITHIUM No results found for: VALPROATE No components found for:  CBMZ  Current Medications: Current Outpatient Medications  Medication Sig Dispense Refill  . aspirin 81 MG chewable tablet Chew 1 tablet (81 mg total) by mouth daily.    Marland Kitchen atorvastatin (LIPITOR) 40 MG tablet TAKE 1 TABLET BY MOUTH ONCE DAILY AT 6PM. 30 tablet 6  . baclofen (LIORESAL) 20 MG tablet Take 1 tablet (20 mg total) by mouth daily. 30 tablet 1  . cholecalciferol (VITAMIN D) 1000 units tablet Take 5,000 Units by mouth daily.    . clonazePAM (KLONOPIN) 0.5 MG tablet Take 0.5-1 tablets (0.25-0.5 mg total) by mouth as directed. 1-2 times a week as needed for  severe panic sx. 20 tablet 1  . clopidogrel (PLAVIX) 75 MG tablet TAKE 1 TABLET BY MOUTH ONCE DAILY. 30 tablet 6  . Cyanocobalamin (VITAMIN B-12 IJ) Inject as directed every 30 (thirty) days.     . DULoxetine (CYMBALTA) 60 MG capsule Take 1 capsule (60 mg total) by mouth daily. 90 capsule 0  . famotidine (PEPCID) 20 MG tablet   3  . famotidine (PEPCID) 40 MG tablet     . fluticasone (FLONASE) 50 MCG/ACT nasal spray Place into the nose.    . gabapentin (NEURONTIN) 300 MG capsule Take 3 capsules (900 mg total) by mouth at bedtime. 90 capsule 01  . ibuprofen (ADVIL,MOTRIN) 800 MG tablet     . lactobacillus acidophilus (BACID) TABS tablet Take 1 tablet by mouth daily.    Marland Kitchen levonorgestrel (MIRENA) 20 MCG/24HR IUD by Intrauterine route.    . nitroGLYCERIN (NITROSTAT) 0.4 MG SL tablet Place 1 tablet (0.4 mg total) under the tongue every 5 (five) minutes as needed for chest pain. 25 tablet 2  . nystatin (MYCOSTATIN/NYSTOP) powder Apply topically.    . ondansetron (ZOFRAN-ODT) 8 MG disintegrating tablet Take 1 tablet by mouth 3 (three) times daily as needed for nausea.   3  . oxyCODONE (OXY IR/ROXICODONE) 5 MG immediate release tablet Take 1 tablet (5 mg total) by mouth every 6 (six) hours as needed for up to 30 days for severe pain. 120 tablet 0  . pantoprazole (PROTONIX) 40 MG tablet TAKE 1 TABLET BY MOUTH ONCE DAILY BEFORE BREAKFAST. 30 tablet 6  . promethazine (PHENERGAN) 25 MG tablet Take 25 mg by mouth 3 (three) times daily as needed for nausea.   2  . torsemide (DEMADEX) 20 MG tablet   2  . metoprolol tartrate (LOPRESSOR) 25 MG tablet Take 1 tablet (25 mg total) by mouth 2 (two) times daily. 180 tablet 3  . oxyCODONE (OXY IR/ROXICODONE) 5 MG immediate release tablet Take 1 tablet (5 mg total) by mouth every 6 (six) hours as needed for up to 30 days for severe pain. Must last 30 days. 120 tablet 0   No current facility-administered medications for this visit.      Musculoskeletal: Strength &  Muscle Tone: Unable to assess Gait & Station: Unable to assess Patient leans: N/A  Psychiatric Specialty Exam: Review of Systems  Psychiatric/Behavioral: The patient is nervous/anxious.   All other systems reviewed and are negative.   There were no vitals taken for this visit.There is no height or weight on file to calculate BMI.  General Appearance: Casual  Eye Contact:  Fair  Speech:  Clear and Coherent  Volume:  Normal  Mood:  Anxious  Affect:  Congruent  Thought Process:  Goal Directed and Descriptions of Associations: Intact  Orientation:  Full (Time, Place, and Person)  Thought Content: Logical   Suicidal Thoughts:  No  Homicidal Thoughts:  No  Memory:  Immediate;   Fair Recent;   Fair Remote;   Fair  Judgement:  Fair  Insight:  Fair  Psychomotor Activity:  Normal  Concentration:  Concentration: Fair and Attention Span: Fair  Recall:  [employer] of Knowledge: Fair  Language: Fair  Akathisia:  No  Handed:  Right  AIMS (if indicated): Denies tremors, rigidity, stiffness  Assets:  Communication Skills Desire for Improvement Social Support  ADL's:  Intact  Cognition: WNL  Sleep:  Fair   Screenings: PHQ2-9     Office Visit from 04/15/2018 in [facility] Clinical Support from 03/18/2018 in [facility] Office Visit from 12/29/2017 in [facility]  PHQ-2 Total Score  0  0  0       Assessment and Plan: Sharon Lawrence is a 53 year old Caucasian female, widowed, lives in [place], has a history of depression, anxiety, insomnia, hyperlipidemia, coronary artery disease, MI status post stent placement, was evaluated by video consult today.  Patient is biologically predisposed given her family history as well as her own health issues.  She also has psychosocial stressors of her son's substance abuse problems as well as the current COVID-19 crisis.  Patient  continues to benefit from medications, however will not make any readjustments today.  She will continue psychotherapy sessions for now.  Plan Panic disorder-improving Cymbalta 60 mg p.o. daily Klonopin 0.25-0.5 mg as needed up to 1-2 times a week for panic attacks only.  She has been limiting use.  Patient is aware about the risk of being on benzodiazepine therapy long-term. I have reviewed [place] controlled substance database.  For GAD-improving Cymbalta as prescribed. She will continue psychotherapy sessions.  MDD-improving Cymbalta 60 mg p.o. daily Continue CBT  For insomnia-improving Melatonin as needed.  Patient to continue CBT with Ms. Alden Hipp.  Follow-up in clinic in 6 weeks or sooner if needed.  I have spent atleast 15 minutes non face to face with patient today. More than 50 % of the time was spent for psychoeducation and supportive psychotherapy and care coordination.  This note was generated in part or whole with voice recognition software. Voice recognition is usually quite accurate but there are transcription errors that can and very often do occur. I apologize for any typographical errors that were not detected and corrected.        Ursula Alert, MD 06/08/2018, 1:44 PM

## 2018-06-08 NOTE — Progress Notes (Signed)
Tc on 06-08-18 @ 1:12 pt allergies were reviewed with no up dates, pt medical and surgical hx was reviewed with no updates, pt medications and pharmacy was reviewed with no updates. Unable to obtain vitals due to this was a phone consult.

## 2018-06-10 ENCOUNTER — Telehealth: Payer: Self-pay | Admitting: *Deleted

## 2018-06-17 ENCOUNTER — Ambulatory Visit: Payer: Self-pay | Attending: Nurse Practitioner | Admitting: Nurse Practitioner

## 2018-06-17 ENCOUNTER — Other Ambulatory Visit: Payer: Self-pay

## 2018-06-17 DIAGNOSIS — M62830 Muscle spasm of back: Secondary | ICD-10-CM

## 2018-06-17 DIAGNOSIS — G894 Chronic pain syndrome: Secondary | ICD-10-CM

## 2018-06-17 DIAGNOSIS — M533 Sacrococcygeal disorders, not elsewhere classified: Secondary | ICD-10-CM

## 2018-06-17 DIAGNOSIS — M47816 Spondylosis without myelopathy or radiculopathy, lumbar region: Secondary | ICD-10-CM

## 2018-06-17 DIAGNOSIS — M5136 Other intervertebral disc degeneration, lumbar region: Secondary | ICD-10-CM

## 2018-06-17 DIAGNOSIS — M792 Neuralgia and neuritis, unspecified: Secondary | ICD-10-CM

## 2018-06-17 DIAGNOSIS — G8929 Other chronic pain: Secondary | ICD-10-CM

## 2018-06-17 DIAGNOSIS — M7918 Myalgia, other site: Secondary | ICD-10-CM

## 2018-06-17 MED ORDER — GABAPENTIN 300 MG PO CAPS: 900.0000 mg | ORAL_CAPSULE | Freq: Every day | ORAL | Status: DC

## 2018-06-17 MED ORDER — OXYCODONE HCL 5 MG PO TABS: 5.0000 mg | ORAL_TABLET | Freq: Four times a day (QID) | ORAL | 0 refills | Status: DC | PRN

## 2018-06-17 MED ORDER — BACLOFEN 20 MG PO TABS: 20.0000 mg | ORAL_TABLET | Freq: Every day | ORAL | 1 refills | Status: DC

## 2018-06-17 NOTE — Progress Notes (Signed)
Pain Management Encounter Note - Virtual Visit via Telephone Telehealth (real-time audio visits between healthcare provider and patient).  Patient's Phone No. & Preferred Pharmacy:  [phone] (home); [phone] (mobile); (Preferred) [phone]  [place], [place] - [address] [place] [address] Phone: [phone] Fax: [phone]   Pre-screening note:  Our staff contacted Sharon Lawrence and offered her an "in person", "face-to-face" appointment versus a telephone encounter. She indicated preferring the telephone encounter, at this time.  Reason for Virtual Visit: COVID-19*  Social distancing based on CDC and AMA recommendations.   I contacted Sharon Lawrence on 06/17/2018 at 9:34 AM by telephone and clearly identified myself as Dionisio David, NP. I verified that I was speaking with the correct person using two identifiers (Name and date of birth: 07/04/1965).  Advanced Informed Consent I sought verbal advanced consent from Sharon Lawrence for telemedicine interactions and virtual visit. I informed Sharon Lawrence of the security and privacy concerns, risks, and limitations associated with performing an evaluation and management service by telephone. I also informed Sharon Lawrence of the availability of "in person" appointments and I informed her of the possibility of a patient responsible charge related to this service. Sharon Lawrence expressed understanding and agreed to proceed.   Historic Elements   Sharon Lawrence is a 53 y.o. year old, female patient evaluated today after her last encounter by our practice on 06/10/2018. Sharon Lawrence  has a past medical history of ADHD (attention deficit hyperactivity disorder), Anxiety, B12 deficiency (02/07/2011), Back pain, CAD (coronary artery disease), Celiac disease, Folic acid deficiency (02/07/2011), Headache, Iron deficiency anemia (02/07/2011), and Myocardial infarction (Sharon Lawrence) (09/15/2017). She also  has a past surgical history that  includes Tonsillectomy; Cholecystectomy; Knee cartilage surgery (Right); LEFT HEART CATH AND CORONARY ANGIOGRAPHY (N/A, 09/15/2017); CORONARY STENT INTERVENTION (Right, 09/15/2017); and Colonoscopy with propofol (N/A, 09/26/2017). Sharon Lawrence has a current medication list which includes the following prescription(s): aspirin, atorvastatin, baclofen, cholecalciferol, clonazepam, clopidogrel, cyanocobalamin, duloxetine, famotidine, famotidine, fluticasone, gabapentin, ibuprofen, lactobacillus acidophilus, levonorgestrel, metoprolol tartrate, nitroglycerin, nystatin, ondansetron, oxycodone, oxycodone, pantoprazole, promethazine, and torsemide. She  reports that she quit smoking about 14 months ago. Her smoking use included cigarettes. She has a 3.00 pack-year smoking history. She has never used smokeless tobacco. She reports that she does not drink alcohol or use drugs. Sharon Lawrence is allergic to bee venom; erythromycin; cephalexin; neosporin original [bacitracin-neomycin-polymyxin]; prednisone; sulfa antibiotics; tobrex [tobramycin]; tramadol; and penicillins.   HPI  I last saw her on 04/15/2018. She is being evaluated for medication management. She rates her lower back and right leg pain a 7/10. She admits that she is having a flare up of her pain today. Her leg pain goes into her knee. She denies any numbness or tingling. She has some weakness. She did have a fall on last Wednesday while her dog was going up and got under her leg. She fell down. She feels like the pain got to rest so this helped her to manage her pain. She denies any new concerns.  Pharmacotherapy Assessment  Analgesic:Oxycodone IR 5 mg every 6 hours (20 mg/day of oxycodone) MME/day:77m/day.  Monitoring: Pharmacotherapy: No side-effects or adverse reactions reported. [place] PMP: PDMP not reviewed this encounter.       Compliance: No problems identified. Plan: Refer to "POC".  Review of recent tests  DG Chest 2 View CLINICAL DATA:  Chest  pain.  EXAM: CHEST - 2 VIEW  COMPARISON:  Radiograph September 15, 2017.  FINDINGS: The heart size and mediastinal contours are within normal limits. Both lungs are clear. No pneumothorax or pleural effusion is noted. The visualized skeletal structures are unremarkable.  IMPRESSION: No active cardiopulmonary disease.  Electronically Signed   By: Marijo Conception, M.D.   On: 12/31/2017 19:05   Office Visit on 04/15/2018  Component Date Value Ref Range Status  . Summary 04/15/2018 FINAL   Final   Comment: ==================================================================== TOXASSURE SELECT 13 (MW) ==================================================================== Test                             Result       Flag       Units Drug Present and Declared for Prescription Verification   Oxycodone                      1599         EXPECTED   ng/mg creat   Oxymorphone                    1693         EXPECTED   ng/mg creat   Noroxycodone                   1441         EXPECTED   ng/mg creat   Noroxymorphone                 337          EXPECTED   ng/mg creat    Sources of oxycodone are scheduled prescription medications.    Oxymorphone, noroxycodone, and noroxymorphone are expected    metabolites of oxycodone. Oxymorphone is also available as a    scheduled prescription medication. Drug Present not Declared for Prescription Verification   7-aminoclonazepam              196          UNEXPECTED ng/mg creat    7-aminoclonazepam is an expected metabolite of clonazepam. Source    o                          f clonazepam is a scheduled prescription medication. ==================================================================== Test                      Result    Flag   Units      Ref Range   Creatinine              112              mg/dL      >=20 ==================================================================== Declared Medications:  The flagging and interpretation on this report are  based on the  following declared medications.  Unexpected results may arise from  inaccuracies in the declared medications.  **Note: The testing scope of this panel includes these medications:  Oxycodone (Oxy-IR)  **Note: The testing scope of this panel does not include following  reported medications:  Fluticasone  Gabapentin (Neurontin)  Levonorgestrel (Mirena)  Metoprolol (Lopressor)  Nitroglycerin (Nitrostat)  Nystatin (Mycostatin)  Ondansetron (Zofran)  Pantoprazole (Protonix)  Promethazine (Phenergan)  Supplement (Probiotic)  Torsemide (Demadex) ==============================                          ====================================== For clinical consultation, please call [phone]. ====================================================================  Assessment  The primary encounter diagnosis was DDD (degenerative disc disease), lumbar. Diagnoses of Back muscle spasm, Chronic musculoskeletal pain, Neurogenic pain, Chronic sacroiliac joint pain (Bilateral) (L>R), Chronic pain syndrome, and Lumbar facet syndrome (Bilateral) were also pertinent to this visit.  Plan of Care  I am having Sharon Lawrence maintain her cholecalciferol, Cyanocobalamin (VITAMIN B-12 IJ), promethazine, ondansetron, aspirin, nitroGLYCERIN, pantoprazole, atorvastatin, torsemide, levonorgestrel, nystatin, fluticasone, lactobacillus acidophilus, famotidine, metoprolol tartrate, clopidogrel, ibuprofen, famotidine, DULoxetine, clonazePAM, baclofen, gabapentin, oxyCODONE, and oxyCODONE.  Pharmacotherapy (Medications Ordered): Meds ordered this encounter  Medications  . baclofen (LIORESAL) 20 MG tablet    Sig: Take 1 tablet (20 mg total) by mouth daily.    Dispense:  30 tablet    Refill:  1    Do not place medication on "Automatic Refill". Fill one day early if pharmacy is closed on scheduled refill date.    Order Specific Question:   Supervising Provider    Answer:   Milinda Pointer [phone]   . gabapentin (NEURONTIN) 300 MG capsule    Sig: Take 3 capsules (900 mg total) by mouth at bedtime.    Dispense:  90 capsule    Refill:  01    Do not place medication on "Automatic Refill". Fill one day early if pharmacy is closed on scheduled refill date.    Order Specific Question:   Supervising Provider    Answer:   Milinda Pointer [phone]  . oxyCODONE (OXY IR/ROXICODONE) 5 MG immediate release tablet    Sig: Take 1 tablet (5 mg total) by mouth every 6 (six) hours as needed for up to 30 days for severe pain. Must last 30 days.    Dispense:  120 tablet    Refill:  0    [place] STOP ACT - Not applicable. Fill one day early if pharmacy is closed on scheduled refill date. Must last 30 days.    Order Specific Question:   Supervising Provider    Answer:   Milinda Pointer [phone]  . oxyCODONE (OXY IR/ROXICODONE) 5 MG immediate release tablet    Sig: Take 1 tablet (5 mg total) by mouth every 6 (six) hours as needed for up to 30 days for severe pain.    Dispense:  120 tablet    Refill:  0    Do not place this medication, or any other prescription from our practice, on "Automatic Refill". Patient may have prescription filled one day early if pharmacy is closed on scheduled refill date.    Order Specific Question:   Supervising Provider    Answer:   Milinda Pointer [phone]   Orders:  No orders of the defined types were placed in this encounter.  Follow-up plan:   Return in about 2 months (around 08/17/2018) for MedMgmt.   I discussed the assessment and treatment plan with the patient. The patient was provided an opportunity to ask questions and all were answered. The patient agreed with the plan and demonstrated an understanding of the instructions.  Patient advised to call back or seek an in-person evaluation if the symptoms or condition worsens.  Total duration of non-face-to-face encounter: 12 minutes.  Note by: Dionisio David, NP Date: 06/17/2018; Time: 9:45 AM  Disclaimer:  *  Given the special circumstances of the COVID-19 pandemic, the federal government has announced that the Office for Civil Rights (OCR) will exercise its enforcement discretion and will not impose penalties on physicians using telehealth in the event of noncompliance with regulatory requirements under the [place] and  Accountability Act (HIPAA) in connection with the good faith provision of telehealth during the QPRFF-63 national public health emergency. (AMA)

## 2018-06-25 DIAGNOSIS — R6889 Other general symptoms and signs: Secondary | ICD-10-CM | POA: Diagnosis not present

## 2018-07-07 ENCOUNTER — Other Ambulatory Visit: Payer: Self-pay | Admitting: Psychiatry

## 2018-07-07 DIAGNOSIS — F410 Panic disorder [episodic paroxysmal anxiety] without agoraphobia: Secondary | ICD-10-CM

## 2018-07-07 DIAGNOSIS — F330 Major depressive disorder, recurrent, mild: Secondary | ICD-10-CM

## 2018-07-07 DIAGNOSIS — F411 Generalized anxiety disorder: Secondary | ICD-10-CM

## 2018-07-07 MED ORDER — DULOXETINE HCL 60 MG PO CPEP: 60.0000 mg | ORAL_CAPSULE | Freq: Every day | ORAL | 0 refills | Status: DC

## 2018-07-07 NOTE — Telephone Encounter (Signed)
Sent cymbalta 90 days

## 2018-07-20 ENCOUNTER — Encounter: Payer: Self-pay | Admitting: Psychiatry

## 2018-07-20 ENCOUNTER — Ambulatory Visit (INDEPENDENT_AMBULATORY_CARE_PROVIDER_SITE_OTHER): Payer: Self-pay | Admitting: Psychiatry

## 2018-07-20 ENCOUNTER — Other Ambulatory Visit: Payer: Self-pay

## 2018-07-20 DIAGNOSIS — F5105 Insomnia due to other mental disorder: Secondary | ICD-10-CM

## 2018-07-20 DIAGNOSIS — F410 Panic disorder [episodic paroxysmal anxiety] without agoraphobia: Secondary | ICD-10-CM

## 2018-07-20 DIAGNOSIS — F411 Generalized anxiety disorder: Secondary | ICD-10-CM

## 2018-07-20 DIAGNOSIS — F330 Major depressive disorder, recurrent, mild: Secondary | ICD-10-CM

## 2018-07-20 NOTE — Progress Notes (Signed)
Virtual Visit via Video Note  I connected with Sharon Lawrence on 07/20/18 at  1:00 PM EDT by a video enabled telemedicine application and verified that I am speaking with the correct person using two identifiers.   I discussed the limitations of evaluation and management by telemedicine and the availability of in person appointments. The patient expressed understanding and agreed to proceed.    I discussed the assessment and treatment plan with the patient. The patient was provided an opportunity to ask questions and all were answered. The patient agreed with the plan and demonstrated an understanding of the instructions.   The patient was advised to call back or seek an in-person evaluation if the symptoms worsen or if the condition fails to improve as anticipated.   [facility] MD OP Progress Note  07/20/2018 1:11 PM Sharon Lawrence  MRN:  [id number]  Chief Complaint:  Chief Complaint    Follow-up     HPI: Sharon Lawrence is a 53 year old Caucasian female who is widowed, employed as a Marine scientist, lives in [place], has a history of coronary artery disease status post STEMI on 09/15/2017, status post stent placement, hyperlipidemia, chronic anemia was evaluated by telemedicine today.  Patient today reports she is currently at work.  She reports work is going well.  She denies any significant anxiety symptoms and reports she has been able to cope with the situational stressors of COVID-19 outbreak and the stress at work.  Patient reports sleep continues to be restless on and off.  She continues to take melatonin which helps.  She does not want any additional medications at this time.  She is compliant on the Cymbalta as prescribed.  She denies side effects.  She continues to limit use of Klonopin and has not used any since the past 2 weeks or more.  She denies any significant panic attacks at this time.  Patient reports she has to schedule an appointment with her therapist and will call the clinic to do so.  She  reports her son continues to stay sober and is currently working and that is a big relief for her.  She denies any other concerns today. Visit Diagnosis:    ICD-10-CM   1. Panic disorder F41.0   2. GAD (generalized anxiety disorder) F41.1   3. MDD (major depressive disorder), recurrent episode, mild ([place]) F33.0   4. Insomnia due to mental disorder F51.05     Past Psychiatric History: Reviewed past psychiatric history from my progress note on 11/17/2017.  Past trials of Zoloft, Paxil, trazodone.  Past Medical History:  Past Medical History:  Diagnosis Date  . ADHD (attention deficit hyperactivity disorder)   . Anxiety   . B12 deficiency 02/07/2011  . Back pain   . CAD (coronary artery disease)    a. s/p recent STEMI on 09/15/2017 with DES to RCA  . Celiac disease   . Folic acid deficiency 49/09/261  . Headache   . Iron deficiency anemia 02/07/2011  . Myocardial infarction ([place]) 09/15/2017    Past Surgical History:  Procedure Laterality Date  . CHOLECYSTECTOMY    . COLONOSCOPY WITH PROPOFOL N/A 09/26/2017   Procedure: COLONOSCOPY WITH PROPOFOL;  Surgeon: Rogene Houston, MD;  Location: [facility];  Service: Endoscopy;  Laterality: N/A;  . CORONARY STENT INTERVENTION Right 09/15/2017   Procedure: CORONARY STENT INTERVENTION;  Surgeon: Jettie Booze, MD;  Location: [facility];  Service: Cardiovascular;  Laterality: Right;  RCA  . KNEE CARTILAGE SURGERY Right   .  LEFT HEART CATH AND CORONARY ANGIOGRAPHY N/A 09/15/2017   Procedure: LEFT HEART CATH AND CORONARY ANGIOGRAPHY;  Surgeon: Jettie Booze, MD;  Location: [facility];  Service: Cardiovascular;  Laterality: N/A;  . TONSILLECTOMY      Family Psychiatric History: I have reviewed family psychiatric history from my progress note on 11/17/2017.  Family History:  Family History  Problem Relation Age of Onset  . Cancer Mother   . Hypertension Mother   . Hypertension Father   . Hypothyroidism Father    . Cancer Sister   . Anxiety disorder Sister   . Depression Sister   . CAD Maternal Grandfather   . Cirrhosis Paternal Grandfather   . CAD Paternal Grandmother   . Diabetes Paternal Grandmother   . Hypothyroidism Paternal Grandmother   . Drug abuse Son   . Bipolar disorder Son     Social History: I have reviewed social history from my progress note on 11/17/2017. Social History   Socioeconomic History  . Marital status: Widowed    Spouse name: Not on file  . Number of children: 3  . Years of education: Not on file  . Highest education level: Associate degree: occupational, Hotel manager, or vocational program  Occupational History  . Not on file  Social Needs  . Financial resource strain: Not hard at all  . Food insecurity:    Worry: Never true    Inability: Never true  . Transportation needs:    Medical: No    Non-medical: No  Tobacco Use  . Smoking status: Former Smoker    Packs/day: 0.25    Years: 12.00    Pack years: 3.00    Types: Cigarettes    Last attempt to quit: 04/04/2017    Years since quitting: 1.2  . Smokeless tobacco: Never Used  Substance and Sexual Activity  . Alcohol use: No  . Drug use: No  . Sexual activity: Not Currently  Lifestyle  . Physical activity:    Days per week: 3 days    Minutes per session: 30 min  . Stress: Very much  Relationships  . Social connections:    Talks on phone: More than three times a week    Gets together: More than three times a week    Attends religious service: More than 4 times per year    Active member of club or organization: No    Attends meetings of clubs or organizations: Never    Relationship status: Widowed  Other Topics Concern  . Not on file  Social History Narrative  . Not on file    Allergies:  Allergies  Allergen Reactions  . Bee Venom Anaphylaxis  . Erythromycin Anaphylaxis  . Cephalexin   . Neosporin Original [Bacitracin-Neomycin-Polymyxin] Swelling    Swelling at site of application  .  Prednisone   . Sulfa Antibiotics Swelling    Sulfa eye drops caused eyes to swell  . Tobrex [Tobramycin] Swelling  . Tramadol Hives  . Penicillins Rash    Has patient had a PCN reaction causing immediate rash, facial/tongue/throat swelling, SOB or lightheadedness with hypotension: Yes Has patient had a PCN reaction causing severe rash involving mucus membranes or skin necrosis: No Has patient had a PCN reaction that required hospitalization: No Has patient had a PCN reaction occurring within the last 10 years: No If all of the above answers are "NO", then may proceed with Cephalosporin use.     Metabolic Disorder Labs: Lab Results  Component Value Date  HGBA1C 5.6 09/16/2017   MPG 114 09/16/2017   MPG 123 (H) 07/07/2013   No results found for: PROLACTIN Lab Results  Component Value Date   CHOL 77 09/16/2017   TRIG 64 09/16/2017   HDL 19 (L) 09/16/2017   CHOLHDL 4.1 09/16/2017   VLDL 13 09/16/2017   LDLCALC 45 09/16/2017   LDLCALC 97 07/07/2013   Lab Results  Component Value Date   TSH 2.471 07/07/2013   TSH 1.745 06/03/2012    Therapeutic Level Labs: No results found for: LITHIUM No results found for: VALPROATE No components found for:  CBMZ  Current Medications: Current Outpatient Medications  Medication Sig Dispense Refill  . aspirin 81 MG chewable tablet Chew 1 tablet (81 mg total) by mouth daily.    Marland Kitchen atorvastatin (LIPITOR) 40 MG tablet TAKE 1 TABLET BY MOUTH ONCE DAILY AT 6PM. 30 tablet 6  . baclofen (LIORESAL) 20 MG tablet Take 1 tablet (20 mg total) by mouth daily. 30 tablet 1  . cholecalciferol (VITAMIN D) 1000 units tablet Take 5,000 Units by mouth daily.    . clonazePAM (KLONOPIN) 0.5 MG tablet Take 0.5-1 tablets (0.25-0.5 mg total) by mouth as directed. 1-2 times a week as needed for severe panic sx. 20 tablet 1  . clopidogrel (PLAVIX) 75 MG tablet TAKE 1 TABLET BY MOUTH ONCE DAILY. 30 tablet 6  . Cyanocobalamin (VITAMIN B-12 IJ) Inject as directed  every 30 (thirty) days.     . DULoxetine (CYMBALTA) 60 MG capsule Take 1 capsule (60 mg total) by mouth daily. 90 capsule 0  . famotidine (PEPCID) 20 MG tablet   3  . famotidine (PEPCID) 40 MG tablet     . fluticasone (FLONASE) 50 MCG/ACT nasal spray Place into the nose.    . gabapentin (NEURONTIN) 300 MG capsule Take 3 capsules (900 mg total) by mouth at bedtime. 90 capsule 01  . ibuprofen (ADVIL,MOTRIN) 800 MG tablet     . lactobacillus acidophilus (BACID) TABS tablet Take 1 tablet by mouth daily.    Marland Kitchen levonorgestrel (MIRENA) 20 MCG/24HR IUD by Intrauterine route.    . metoprolol tartrate (LOPRESSOR) 25 MG tablet Take 1 tablet (25 mg total) by mouth 2 (two) times daily. 180 tablet 3  . nitroGLYCERIN (NITROSTAT) 0.4 MG SL tablet Place 1 tablet (0.4 mg total) under the tongue every 5 (five) minutes as needed for chest pain. 25 tablet 2  . nystatin (MYCOSTATIN/NYSTOP) powder Apply topically.    . ondansetron (ZOFRAN-ODT) 8 MG disintegrating tablet Take 1 tablet by mouth 3 (three) times daily as needed for nausea.   3  . [START ON 08/02/2018] oxyCODONE (OXY IR/ROXICODONE) 5 MG immediate release tablet Take 1 tablet (5 mg total) by mouth every 6 (six) hours as needed for up to 30 days for severe pain. Must last 30 days. 120 tablet 0  . oxyCODONE (OXY IR/ROXICODONE) 5 MG immediate release tablet Take 1 tablet (5 mg total) by mouth every 6 (six) hours as needed for up to 30 days for severe pain. 120 tablet 0  . pantoprazole (PROTONIX) 40 MG tablet TAKE 1 TABLET BY MOUTH ONCE DAILY BEFORE BREAKFAST. 30 tablet 6  . promethazine (PHENERGAN) 25 MG tablet Take 25 mg by mouth 3 (three) times daily as needed for nausea.   2  . torsemide (DEMADEX) 20 MG tablet   2   No current facility-administered medications for this visit.      Musculoskeletal: Strength & Muscle Tone: within normal limits Gait & Station: normal  Patient leans: N/A  Psychiatric Specialty Exam: Review of Systems   Psychiatric/Behavioral: The patient is nervous/anxious.   All other systems reviewed and are negative.   There were no vitals taken for this visit.There is no height or weight on file to calculate BMI.  General Appearance: Casual  Eye Contact:  Fair  Speech:  Clear and Coherent  Volume:  Normal  Mood:  Anxious  Affect:  Appropriate  Thought Process:  Goal Directed and Descriptions of Associations: Intact  Orientation:  Full (Time, Place, and Person)  Thought Content: Logical   Suicidal Thoughts:  No  Homicidal Thoughts:  No  Memory:  Immediate;   Fair Recent;   Fair Remote;   Fair  Judgement:  Fair  Insight:  Fair  Psychomotor Activity:  Normal  Concentration:  Concentration: Fair and Attention Span: Fair  Recall:  [employer] of Knowledge: Fair  Language: Fair  Akathisia:  No  Handed:  Right  AIMS (if indicated): denies tremors, rigidity,stiffness  Assets:  Communication Skills Desire for Improvement Housing Social Support  ADL's:  Intact  Cognition: WNL  Sleep:  Restless on and off   Screenings: PHQ2-9     Office Visit from 04/15/2018 in [facility] Clinical Support from 03/18/2018 in [facility] Office Visit from 12/29/2017 in [facility]  PHQ-2 Total Score  0  0  0       Assessment and Plan: Sharon Lawrence is a 53 year old Caucasian female, widowed, lives in [place], has a history of depression, anxiety, insomnia, hyperlipidemia, coronary artery disease, MI status post stent placement was evaluated by telemedicine today.  Patient is biologically predisposed given her family history as well as her own health issues.  Patient also has psychosocial stressors of her son's substance abuse problems as well as the current COVID-19 outbreak.  Patient is currently making progress on the current medication regimen.  Plan as noted below.  Plan  Panic  disorder-improving Cymbalta 60 mg p.o. daily Klonopin 0.25-0.5 mg as needed up to 1-2 times a week for panic attacks.  She has been limiting use.  She is aware about the risk of being on benzodiazepine therapy long-term. I have reviewed [place] controlled substance database.  For GAD-improving Cymbalta as prescribed Continue psychotherapy sessions.  MDD-improving Cymbalta 60 mg p.o. daily Continue CBT  For insomnia-improving Melatonin as needed.  Patient to continue psychotherapy sessions with Ms. Alden Hipp.  Patient will call the clinic to schedule the appointment with her.  Follow-up in clinic in 2 months or sooner if needed.  Appointment scheduled for July 8 at 2:45 PM  I have spent atleast 15 minutes non face to face with patient today. More than 50 % of the time was spent for psychoeducation and supportive psychotherapy and care coordination.  This note was generated in part or whole with voice recognition software. Voice recognition is usually quite accurate but there are transcription errors that can and very often do occur. I apologize for any typographical errors that were not detected and corrected.        Ursula Alert, MD 07/20/2018, 1:11 PM

## 2018-08-06 ENCOUNTER — Telehealth: Payer: Self-pay | Admitting: Cardiology

## 2018-08-06 DIAGNOSIS — E559 Vitamin D deficiency, unspecified: Secondary | ICD-10-CM | POA: Diagnosis not present

## 2018-08-06 DIAGNOSIS — D649 Anemia, unspecified: Secondary | ICD-10-CM | POA: Diagnosis not present

## 2018-08-06 DIAGNOSIS — D508 Other iron deficiency anemias: Secondary | ICD-10-CM | POA: Diagnosis not present

## 2018-08-06 DIAGNOSIS — E039 Hypothyroidism, unspecified: Secondary | ICD-10-CM | POA: Diagnosis not present

## 2018-08-06 NOTE — Telephone Encounter (Signed)
Virtual Visit Pre-Appointment Phone Call  "(Name), I am calling you today to discuss your upcoming appointment. We are currently trying to limit exposure to the virus that causes COVID-19 by seeing patients at home rather than in the office."  "What is the BEST phone number to call the day of the visit?" -       [phone] 1. Do you have or have access to (through a family member/friend) a smartphone with video capability that we can use for your visit?" a. If yes - list this number in appt notes as cell (if different from BEST phone #) and list the appointment type as a VIDEO visit in appointment notes b. If no - list the appointment type as a PHONE visit in appointment notes  2. Confirm consent - "In the setting of the current Covid19 crisis, you are scheduled for a (phone or video) visit with your provider on (date) at (time).  Just as we do with many in-office visits, in order for you to participate in this visit, we must obtain consent.  If you'd like, I can send this to your mychart (if signed up) or email for you to review.  Otherwise, I can obtain your verbal consent now.  All virtual visits are billed to your insurance company just like a normal visit would be.  By agreeing to a virtual visit, we'd like you to understand that the technology does not allow for your provider to perform an examination, and thus may limit your provider's ability to fully assess your condition. If your provider identifies any concerns that need to be evaluated in person, we will make arrangements to do so.  Finally, though the technology is pretty good, we cannot assure that it will always work on either your or our end, and in the setting of a video visit, we may have to convert it to a phone-only visit.  In either situation, we cannot ensure that we have a secure connection.  Are you willing to proceed?" STAFF: Did the patient verbally acknowledge consent to telehealth visit? Document YES/NO here:  YES    3. Advise patient to be prepared - "Two hours prior to your appointment, go ahead and check your blood pressure, pulse, oxygen saturation, and your weight (if you have the equipment to check those) and write them all down. When your visit starts, your provider will ask you for this information. If you have an Apple Watch or Kardia device, please plan to have heart rate information ready on the day of your appointment. Please have a pen and paper handy nearby the day of the visit as well."  4. Give patient instructions for MyChart download to smartphone OR [email or web address] as below if video visit (depending on what platform provider is using)  5. Inform patient they will receive a phone call 15 minutes prior to their appointment time (may be from unknown caller ID) so they should be prepared to answer    TELEPHONE CALL NOTE  Sharon Lawrence has been deemed a candidate for a follow-up tele-health visit to limit community exposure during the Covid-19 pandemic. I spoke with the patient via phone to ensure availability of phone/video source, confirm preferred email & phone number, and discuss instructions and expectations.  I reminded Sharon Lawrence to be prepared with any vital sign and/or heart rhythm information that could potentially be obtained via home monitoring, at the time of her visit. I reminded Sharon Lawrence to expect a phone call  prior to her visit.  Chanda Busing 08/06/2018 8:59 AM   INSTRUCTIONS FOR DOWNLOADING THE MYCHART APP TO SMARTPHONE  - The patient must first make sure to have activated MyChart and know their login information - If Apple, go to [employer] and type in MyChart in the search bar and download the app. If Android, ask patient to go to [employer] and type in [place] in the search bar and download the app. The app is free but as with any other app downloads, their phone may require them to verify saved payment information or Apple/Android password.  - The patient will  need to then log into the app with their MyChart username and password, and select [facility] as their healthcare provider to link the account. When it is time for your visit, go to the MyChart app, find appointments, and click Begin Video Visit. Be sure to Select Allow for your device to access the Microphone and Camera for your visit. You will then be connected, and your provider will be with you shortly.  **If they have any issues connecting, or need assistance please contact MyChart service desk [phone])**  **If using a computer, in order to ensure the best quality for their visit they will need to use either of the following Internet Browsers: [employer], or Google Chrome**  IF USING DOXIMITY or DOXY.ME - The patient will receive a link just prior to their visit by text.     FULL LENGTH CONSENT FOR TELE-HEALTH VISIT   I hereby voluntarily request, consent and authorize [facility] and its employed or contracted physicians, physician assistants, nurse practitioners or other licensed health care professionals (the Practitioner), to provide me with telemedicine health care services (the Services") as deemed necessary by the treating Practitioner. I acknowledge and consent to receive the Services by the Practitioner via telemedicine. I understand that the telemedicine visit will involve communicating with the Practitioner through live audiovisual communication technology and the disclosure of certain medical information by electronic transmission. I acknowledge that I have been given the opportunity to request an in-person assessment or other available alternative prior to the telemedicine visit and am voluntarily participating in the telemedicine visit.  I understand that I have the right to withhold or withdraw my consent to the use of telemedicine in the course of my care at any time, without affecting my right to future care or treatment, and that the Practitioner or I  may terminate the telemedicine visit at any time. I understand that I have the right to inspect all information obtained and/or recorded in the course of the telemedicine visit and may receive copies of available information for a reasonable fee.  I understand that some of the potential risks of receiving the Services via telemedicine include:   Delay or interruption in medical evaluation due to technological equipment failure or disruption;  Information transmitted may not be sufficient (e.g. poor resolution of images) to allow for appropriate medical decision making by the Practitioner; and/or   In rare instances, security protocols could fail, causing a breach of personal health information.  Furthermore, I acknowledge that it is my responsibility to provide information about my medical history, conditions and care that is complete and accurate to the best of my ability. I acknowledge that Practitioner's advice, recommendations, and/or decision may be based on factors not within their control, such as incomplete or inaccurate data provided by me or distortions of diagnostic images or specimens that may result from electronic  transmissions. I understand that the practice of medicine is not an exact science and that Practitioner makes no warranties or guarantees regarding treatment outcomes. I acknowledge that I will receive a copy of this consent concurrently upon execution via email to the email address I last provided but may also request a printed copy by calling the office of [facility].    I understand that my insurance will be billed for this visit.   I have read or had this consent read to me.  I understand the contents of this consent, which adequately explains the benefits and risks of the Services being provided via telemedicine.   I have been provided ample opportunity to ask questions regarding this consent and the Services and have had my questions answered to my satisfaction.  I  give my informed consent for the services to be provided through the use of telemedicine in my medical care  By participating in this telemedicine visit I agree to the above.

## 2018-08-07 ENCOUNTER — Encounter: Payer: Self-pay | Admitting: Cardiology

## 2018-08-07 ENCOUNTER — Telehealth (INDEPENDENT_AMBULATORY_CARE_PROVIDER_SITE_OTHER): Payer: Self-pay | Admitting: Cardiology

## 2018-08-07 VITALS — BP 101/62 | HR 70 | Ht 60.0 in | Wt 180.0 lb

## 2018-08-07 DIAGNOSIS — I2510 Atherosclerotic heart disease of native coronary artery without angina pectoris: Secondary | ICD-10-CM

## 2018-08-07 DIAGNOSIS — E782 Mixed hyperlipidemia: Secondary | ICD-10-CM

## 2018-08-07 NOTE — Progress Notes (Signed)
Medication Instructions:  STOP PLAVIX ON 09/16/2018  Labwork: I WILL REQUEST LABS FROM PCP  Testing/Procedures: NONE  Follow-Up: Your physician wants you to follow-up in: 6 MONTHS. You will receive a reminder letter in the mail two months in advance. If you don't receive a letter, please call our office to schedule the follow-up appointment.   Any Other Special Instructions Will Be Listed Below (If Applicable).     If you need a refill on your cardiac medications before your next appointment, please call your pharmacy.

## 2018-08-07 NOTE — Progress Notes (Signed)
Virtual Visit via Telephone Note   This visit type was conducted due to national recommendations for restrictions regarding the COVID-19 Pandemic (e.g. social distancing) in an effort to limit this patient's exposure and mitigate transmission in our community.  Due to her co-morbid illnesses, this patient is at least at moderate risk for complications without adequate follow up.  This format is felt to be most appropriate for this patient at this time.  The patient did not have access to video technology/had technical difficulties with video requiring transitioning to audio format only (telephone).  All issues noted in this document were discussed and addressed.  No physical exam could be performed with this format.  Please refer to the patient's chart for her  consent to telehealth for [facility].   Date:  08/07/2018   ID:  Sharon Lawrence, DOB 20-Sep-1965, MRN [id number]  Patient Location: Home Provider Location: Home  PCP:  Vidal Schwalbe, MD  Cardiologist:  Carlyle Dolly, MD  Electrophysiologist:  None   Evaluation Performed:  Follow-Up Visit  Chief Complaint:  6 month follow up  History of Present Illness:    Sharon Lawrence is a 53 y.o. female seen today for follow up of the following medical problems.   1. CAD - 09/2017 admitted with STEMI, receved PCI to RCA. LVgram 55-65%.  - brillinta changed to plavix due to hematochezia. Lopressor stopped at that time due to soft bp's but was restarted at f/u. Has not been on ACE-I   - no recent chest pain.  - no SOB/DOE - compliant with meds. Soft bp's limit medical therapy.    2. Hematochezia - admission 09/2017  - brillinta was changed to plavix during that admission, no recurrent issues.   - no recent bleeding  3. Hyperlipidemia - limiting statin dosing due to previous elevated LFTs - last LFTs nearly normal  - she reports labs with pcp yesterday - compliant with staitn   SH: nurse, works at [employer] center    The patient does not have symptoms concerning for COVID-19 infection (fever, chills, cough, or new shortness of breath).    Past Medical History:  Diagnosis Date  . ADHD (attention deficit hyperactivity disorder)   . Anxiety   . B12 deficiency 02/07/2011  . Back pain   . CAD (coronary artery disease)    a. s/p recent STEMI on 09/15/2017 with DES to RCA  . Celiac disease   . Folic acid deficiency 98/11/2117  . Headache   . Iron deficiency anemia 02/07/2011  . Myocardial infarction ([place]) 09/15/2017   Past Surgical History:  Procedure Laterality Date  . CHOLECYSTECTOMY    . COLONOSCOPY WITH PROPOFOL N/A 09/26/2017   Procedure: COLONOSCOPY WITH PROPOFOL;  Surgeon: Rogene Houston, MD;  Location[facility];  Service: Endoscopy;  Laterality: N/A;  . CORONARY STENT INTERVENTION Right 09/15/2017   Procedure: CORONARY STENT INTERVENTION;  Surgeon: Jettie Booze, MD;  Location: [place] [facility];  Service: Cardiovascular;  Laterality: Right;  RCA  . KNEE CARTILAGE SURGERY Right   . LEFT HEART CATH AND CORONARY ANGIOGRAPHY N/A 09/15/2017   Procedure: LEFT HEART CATH AND CORONARY ANGIOGRAPHY;  Surgeon: Jettie Booze, MD;  Location: [place] [facility];  Service: Cardiovascular;  Laterality: N/A;  . TONSILLECTOMY       No outpatient medications have been marked as taking for the 08/07/18 encounter (Appointment) with Arnoldo Lenis, MD.     Allergies:   Bee venom; Erythromycin; Cephalexin; Neosporin original [  bacitracin-neomycin-polymyxin]; Prednisone; Sulfa antibiotics; Tobrex [tobramycin]; Tramadol; and Penicillins   Social History   Tobacco Use  . Smoking status: Former Smoker    Packs/day: 0.25    Years: 12.00    Pack years: 3.00    Types: Cigarettes    Last attempt to quit: 04/04/2017    Years since quitting: 1.3  . Smokeless tobacco: Never Used  Substance Use Topics  . Alcohol use: No  . Drug use: No     Family Hx: The patient's family history includes Anxiety  disorder in her sister; Bipolar disorder in her son; CAD in her maternal grandfather and paternal grandmother; Cancer in her mother and sister; Cirrhosis in her paternal grandfather; Depression in her sister; Diabetes in her paternal grandmother; Drug abuse in her son; Hypertension in her father and mother; Hypothyroidism in her father and paternal grandmother.  ROS:   Please see the history of present illness.     All other systems reviewed and are negative.   Prior CV studies:   The following studies were reviewed today:  09/2017 cath  Prox RCA lesion is 80% stenosed. It appeared to be a ruptured plaque.  A drug-eluting stent was successfully placed using a STENT SYNERGY DES 4X24.  Post intervention, there is a 0% residual stenosis.  The left ventricular systolic function is normal.  LV end diastolic pressure is low.  The left ventricular ejection fraction is 55-65% by visual estimate.  Severe spasm in the right radial artery, requiring heavy sedation and IA NTG. 4Fr diagnostic and 5 Fr Guide catheter used.  Labs/Other Tests and Data Reviewed:    EKG:  No ECG reviewed.  Recent Labs: 12/03/2017: Magnesium 1.9 12/31/2017: ALT 50; BUN 12; Creatinine, Ser 0.76; Hemoglobin 11.8; Platelets 534; Potassium 3.5; Sodium 142   Recent Lipid Panel Lab Results  Component Value Date/Time   CHOL 77 09/16/2017 03:58 AM   TRIG 64 09/16/2017 03:58 AM   HDL 19 (L) 09/16/2017 03:58 AM   CHOLHDL 4.1 09/16/2017 03:58 AM   LDLCALC 45 09/16/2017 03:58 AM    Wt Readings from Last 3 Encounters:  04/15/18 196 lb (88.9 kg)  03/18/18 202 lb 14.4 oz (92 kg)  02/18/18 190 lb (86.2 kg)     Objective:    Vital Signs:   Today's Vitals   08/07/18 0836  BP: 101/62  Pulse: 70  Weight: 180 lb (81.6 kg)  Height: 5' (1.524 m)   Body mass index is 35.15 kg/m.  Normal affect. Normal speech pattern and tone. Comfortable, no apparent distress. No audible signs of SOB or wheezing.   ASSESSMENT &  PLAN:    1. CAD - no recent symptoms - stop plavix 09/16/2018 - medical therapy lmited by soft bp's, have not been able to start an ACE-I  2. Hyperlipidemia - continue statin, dosing limited due to prior elevation of LFTs - request labs from pcp  COVID-19 Education: The signs and symptoms of COVID-19 were discussed with the patient and how to seek care for testing (follow up with PCP or arrange E-visit).  The importance of social distancing was discussed today.  Time:   Today, I have spent 13 minutes with the patient with telehealth technology discussing the above problems.     Medication Adjustments/Labs and Tests Ordered: Current medicines are reviewed at length with the patient today.  Concerns regarding medicines are outlined above.   Tests Ordered: No orders of the defined types were placed in this encounter.   Medication Changes: No orders  of the defined types were placed in this encounter.   Disposition:  Follow up 6 months  Signed, Carlyle Dolly, MD  08/07/2018 7:59 AM    [place]

## 2018-08-13 ENCOUNTER — Encounter: Payer: Self-pay | Admitting: Pain Medicine

## 2018-08-16 NOTE — Progress Notes (Signed)
Pain Management Virtual Encounter Note - Virtual Visit via Telephone Telehealth (real-time audio visits between healthcare provider and patient).   Patient's Phone No. & Preferred Pharmacy:  [phone] (home); [phone] (mobile); (Preferred) [phone] [email or web address]  [place], [place] - [address] [place] [address] Phone: [phone] Fax: [phone]    Pre-screening note:  Our staff contacted Sharon Lawrence and offered her an "in person", "face-to-face" appointment versus a telephone encounter. She indicated preferring the telephone encounter, at this time.   Reason for Virtual Visit: COVID-19*  Social distancing based on CDC and AMA recommendations.   I contacted Sharon Lawrence on 08/17/2018 via telephone.      I clearly identified myself as Gaspar Cola, MD. I verified that I was speaking with the correct person using two identifiers (Name: Sharon Lawrence, and date of birth: 11-Aug-1965).  Advanced Informed Consent I sought verbal advanced consent from Sharon Lawrence for virtual visit interactions. I informed Sharon Lawrence of possible security and privacy concerns, risks, and limitations associated with providing "not-in-person" medical evaluation and management services. I also informed Sharon Lawrence of the availability of "in-person" appointments. Finally, I informed her that there would be a charge for the virtual visit and that she could be  personally, fully or partially, financially responsible for it. Sharon Lawrence expressed understanding and agreed to proceed.   Historic Elements   Sharon Lawrence is a 53 y.o. year old, female patient evaluated today after her last encounter by our practice on 06/17/2018. Sharon Lawrence  has a past medical history of ADHD (attention deficit hyperactivity disorder), Anxiety, B12 deficiency (02/07/2011), Back pain, CAD (coronary artery disease), Celiac disease, Folic acid deficiency (02/07/2011), Headache, Iron deficiency  anemia (02/07/2011), and Myocardial infarction ([place]) (09/15/2017). She also  has a past surgical history that includes Tonsillectomy; Cholecystectomy; Knee cartilage surgery (Right); LEFT HEART CATH AND CORONARY ANGIOGRAPHY (N/A, 09/15/2017); CORONARY STENT INTERVENTION (Right, 09/15/2017); and Colonoscopy with propofol (N/A, 09/26/2017). Sharon Lawrence has a current medication list which includes the following prescription(s): aspirin, atorvastatin, baclofen, cholecalciferol, clonazepam, clopidogrel, cyanocobalamin, duloxetine, fluticasone, folic acid, gabapentin, ibuprofen, lactobacillus acidophilus, levonorgestrel, melatonin, nitroglycerin, nystatin, ondansetron, oxycodone, oxycodone, oxycodone, pantoprazole, promethazine, torsemide, gabapentin, and metoprolol tartrate. She  reports that she quit smoking about 16 months ago. Her smoking use included cigarettes. She has a 3.00 pack-year smoking history. She has never used smokeless tobacco. She reports that she does not drink alcohol or use drugs. Sharon Lawrence is allergic to bee venom; erythromycin; cephalexin; neosporin original [bacitracin-neomycin-polymyxin]; prednisone; sulfa antibiotics; tobrex [tobramycin]; tramadol; and penicillins.   HPI  Today, she is being contacted for medication management.  Today, while writing for her gabapentin prescription I noticed that the 300 mg pill was not covered by her insurance but the 800 and then 100 were covered.  Because of this, I went ahead and switched her from taking the gabapentin 300 mg 3 tablets at bedtime to taking 1 800 mg tablet +1 100 mg tablet at bedtime.  I also instructed the patient to see if taking only the 800 mg pill would be sufficient and if so, then she could stop the 100 mg pill.  Will assess this on her next encounter and see if we need to refill both or just 1.  Today I went ahead and refilled her oxycodone IR, the gabapentin, and her baclofen.  I also put orders for her to do the labs to check on her  kidney and liver  function.  She mentioned recently having had some labs done at her PCP office, but none of those are available in the system.  Pharmacotherapy Assessment  Analgesic: Oxycodone IR 5 mg every 6 hours (20 mg/day of oxycodone) MME/day:30mg /day.  Monitoring: Pharmacotherapy: No side-effects or adverse reactions reported. [place] PMP: PDMP reviewed during this encounter.       Compliance: No problems identified. Effectiveness: Clinically acceptable. Plan: Refer to "POC".  Pertinent Labs   SAFETY SCREENING Profile Lab Results  Component Value Date   [facility] NEGATIVE 09/15/2017   PREGTESTUR Negative 07/12/2013   Renal Function Lab Results  Component Value Date   BUN 12 12/31/2017   CREATININE 0.76 12/31/2017   GFRAA >60 12/31/2017   GFRNONAA >60 12/31/2017   Hepatic Function Lab Results  Component Value Date   AST 29 12/31/2017   ALT 50 (H) 12/31/2017   ALBUMIN 3.4 (L) 12/31/2017   UDS Summary  Date Value Ref Range Status  04/15/2018 FINAL  Final    Comment:    ==================================================================== TOXASSURE SELECT 13 (MW) ==================================================================== Test                             Result       Flag       Units Drug Present and Declared for Prescription Verification   Oxycodone                      1599         EXPECTED   ng/mg creat   Oxymorphone                    1693         EXPECTED   ng/mg creat   Noroxycodone                   1441         EXPECTED   ng/mg creat   Noroxymorphone                 337          EXPECTED   ng/mg creat    Sources of oxycodone are scheduled prescription medications.    Oxymorphone, noroxycodone, and noroxymorphone are expected    metabolites of oxycodone. Oxymorphone is also available as a    scheduled prescription medication. Drug Present not Declared for Prescription Verification   7-aminoclonazepam              196          UNEXPECTED ng/mg creat     7-aminoclonazepam is an expected metabolite of clonazepam. Source    of clonazepam is a scheduled prescription medication. ==================================================================== Test                      Result    Flag   Units      Ref Range   Creatinine              112              mg/dL      >=20 ==================================================================== Declared Medications:  The flagging and interpretation on this report are based on the  following declared medications.  Unexpected results may arise from  inaccuracies in the declared medications.  **Note: The testing scope of this panel includes these medications:  Oxycodone (Oxy-IR)  **Note: The testing scope of this panel does  not include following  reported medications:  Fluticasone  Gabapentin (Neurontin)  Levonorgestrel (Mirena)  Metoprolol (Lopressor)  Nitroglycerin (Nitrostat)  Nystatin (Mycostatin)  Ondansetron (Zofran)  Pantoprazole (Protonix)  Promethazine (Phenergan)  Supplement (Probiotic)  Torsemide (Demadex) ==================================================================== For clinical consultation, please call [phone]. ====================================================================    Note: Above Lab results reviewed.  Recent imaging  DG Chest 2 View CLINICAL DATA:  Chest pain.  EXAM: CHEST - 2 VIEW  COMPARISON:  Radiograph September 15, 2017.  FINDINGS: The heart size and mediastinal contours are within normal limits. Both lungs are clear. No pneumothorax or pleural effusion is noted. The visualized skeletal structures are unremarkable.  IMPRESSION: No active cardiopulmonary disease.  Electronically Signed   By: Marijo Conception, M.D.   On: 12/31/2017 19:05  Assessment  The primary encounter diagnosis was Chronic pain syndrome. Diagnoses of Chronic low back pain (Primary Area of Pain) (Bilateral) (R>L) w/ sciatica (Right), Chronic lower extremity pain  (Secondary Area of Pain) (Right), Back muscle spasm, Chronic musculoskeletal pain, Pharmacologic therapy, Disorder of skeletal system, Problems influencing health status, and Neurogenic pain were also pertinent to this visit.  Plan of Care  I have discontinued Sharon Lawrence famotidine. I have also changed her gabapentin. Additionally, I am having her start on oxyCODONE, oxyCODONE, and gabapentin. Lastly, I am having her maintain her cholecalciferol, Cyanocobalamin (VITAMIN B-12 IJ), promethazine, ondansetron, aspirin, nitroGLYCERIN, pantoprazole, atorvastatin, torsemide, levonorgestrel, nystatin, fluticasone, lactobacillus acidophilus, metoprolol tartrate, clopidogrel, ibuprofen, clonazePAM, DULoxetine, folic acid, Melatonin, baclofen, and oxyCODONE.  Pharmacotherapy (Medications Ordered): Meds ordered this encounter  Medications  . baclofen (LIORESAL) 20 MG tablet    Sig: Take 1 tablet (20 mg total) by mouth daily.    Dispense:  30 tablet    Refill:  2    Fill one day early if pharmacy is closed on scheduled refill date. May substitute for generic if available.  Marland Kitchen oxyCODONE (OXY IR/ROXICODONE) 5 MG immediate release tablet    Sig: Take 1 tablet (5 mg total) by mouth every 6 (six) hours as needed for up to 30 days for severe pain. Must last 30 days.    Dispense:  120 tablet    Refill:  0    Chronic Pain: STOP Act - Not applicable. Fill 1 day early if closed on scheduled refill date. Do not fill until: 09/01/2018. To last until: 10/01/2018. Instruct to avoid benzodiazepines within 8 hours of opioid.  Marland Kitchen oxyCODONE (OXY IR/ROXICODONE) 5 MG immediate release tablet    Sig: Take 1 tablet (5 mg total) by mouth every 6 (six) hours as needed for up to 30 days for severe pain. Must last 30 days.    Dispense:  120 tablet    Refill:  0    Chronic Pain: STOP Act - Not applicable. Fill 1 day early if closed on scheduled refill date. Do not fill until: 10/01/2018. To last until: 10/31/2018. Instruct to avoid  benzodiazepines within 8 hours of opioid.  Marland Kitchen oxyCODONE (OXY IR/ROXICODONE) 5 MG immediate release tablet    Sig: Take 1 tablet (5 mg total) by mouth every 6 (six) hours as needed for up to 30 days for severe pain. Must last 30 days.    Dispense:  120 tablet    Refill:  0    Chronic Pain: STOP Act - Not applicable. Fill 1 day early if closed on scheduled refill date. Do not fill until: 10/31/2018. To last until: 11/30/2018. Instruct to avoid benzodiazepines within 8 hours of opioid.  Marland Kitchen gabapentin (NEURONTIN)  100 MG capsule    Sig: Take 1 capsule (100 mg total) by mouth at bedtime.    Dispense:  30 capsule    Refill:  2    Fill one day early if pharmacy is closed on scheduled refill date. May substitute for generic if available.  . gabapentin (NEURONTIN) 800 MG tablet    Sig: Take 1 tablet (800 mg total) by mouth at bedtime.    Dispense:  90 tablet    Refill:  0    Fill one day early if pharmacy is closed on scheduled refill date. May substitute for generic if available.   Orders:  Orders Placed This Encounter  Procedures  . Comp. Metabolic Panel (12)    With GFR. Indications: Chronic Pain Syndrome (G89.4) & Pharmacotherapy (J47.829)    Order Specific Question:   Has the patient fasted?    Answer:   No    Order Specific Question:   CC Results    Answer:   PCP-NURSE [[id number]]  . Magnesium    Indication: Pharmacologic therapy (Q65.784)    Order Specific Question:   CC Results    Answer:   PCP-NURSE [[id number]]  . Vitamin B12    Indication: Pharmacologic therapy (M84.132).    Order Specific Question:   CC Results    Answer:   PCP-NURSE [[id number]]  . Sedimentation rate    Indication: Disorder of skeletal system (M89.9)    Order Specific Question:   CC Results    Answer:   PCP-NURSE [[id number]]  . 25-Hydroxyvitamin D Lcms D2+D3    Indication: Disorder of skeletal system (M89.9).    Order Specific Question:   CC Results    Answer:   PCP-NURSE [[id number]]  . C-reactive protein    Indication:  Problems influencing health status (Z78.9)    Order Specific Question:   CC Results    Answer:   PCP-NURSE [[id number]]   Follow-up plan:   Return in about 3 months (around 11/25/2018) for (VV), E/M (MM).    I discussed the assessment and treatment plan with the patient. The patient was provided an opportunity to ask questions and all were answered. The patient agreed with the plan and demonstrated an understanding of the instructions.  Patient advised to call back or seek an in-person evaluation if the symptoms or condition worsens.  Total duration of non-face-to-face encounter: 15 minutes.  Note by: Gaspar Cola, MD Date: 08/17/2018; Time: 9:46 AM  Note: This dictation was prepared with Dragon dictation. Any transcriptional errors that may result from this process are unintentional.  Disclaimer:  * Given the special circumstances of the COVID-19 pandemic, the federal government has announced that the Office for Civil Rights (OCR) will exercise its enforcement discretion and will not impose penalties on physicians using telehealth in the event of noncompliance with regulatory requirements under the [place] (HIPAA) in connection with the good faith provision of telehealth during the LOVFI-43 national public health emergency. ([place])

## 2018-08-17 ENCOUNTER — Encounter: Payer: Self-pay | Admitting: Nurse Practitioner

## 2018-08-17 ENCOUNTER — Telehealth: Payer: Self-pay | Admitting: *Deleted

## 2018-08-17 ENCOUNTER — Ambulatory Visit: Payer: BC Managed Care – PPO | Attending: Nurse Practitioner | Admitting: Pain Medicine

## 2018-08-17 ENCOUNTER — Other Ambulatory Visit: Payer: Self-pay

## 2018-08-17 DIAGNOSIS — M792 Neuralgia and neuritis, unspecified: Secondary | ICD-10-CM

## 2018-08-17 DIAGNOSIS — M5441 Lumbago with sciatica, right side: Secondary | ICD-10-CM | POA: Diagnosis not present

## 2018-08-17 DIAGNOSIS — M899 Disorder of bone, unspecified: Secondary | ICD-10-CM

## 2018-08-17 DIAGNOSIS — M62830 Muscle spasm of back: Secondary | ICD-10-CM

## 2018-08-17 DIAGNOSIS — G894 Chronic pain syndrome: Secondary | ICD-10-CM | POA: Diagnosis not present

## 2018-08-17 DIAGNOSIS — G8929 Other chronic pain: Secondary | ICD-10-CM

## 2018-08-17 DIAGNOSIS — M79604 Pain in right leg: Secondary | ICD-10-CM | POA: Diagnosis not present

## 2018-08-17 DIAGNOSIS — Z79899 Other long term (current) drug therapy: Secondary | ICD-10-CM

## 2018-08-17 DIAGNOSIS — Z789 Other specified health status: Secondary | ICD-10-CM

## 2018-08-17 DIAGNOSIS — M7918 Myalgia, other site: Secondary | ICD-10-CM

## 2018-08-17 MED ORDER — OXYCODONE HCL 5 MG PO TABS: 5.0000 mg | ORAL_TABLET | Freq: Four times a day (QID) | ORAL | 0 refills | Status: DC | PRN

## 2018-08-17 MED ORDER — BACLOFEN 20 MG PO TABS: 20.0000 mg | ORAL_TABLET | Freq: Every day | ORAL | 2 refills | Status: DC

## 2018-08-17 MED ORDER — GABAPENTIN 100 MG PO CAPS: 100.0000 mg | ORAL_CAPSULE | Freq: Every day | ORAL | 2 refills | Status: DC

## 2018-08-17 MED ORDER — GABAPENTIN 800 MG PO TABS: 800.0000 mg | ORAL_TABLET | Freq: Every day | ORAL | 0 refills | Status: DC

## 2018-08-17 NOTE — Patient Instructions (Signed)
____________________________________________________________________________________________  Medication Rules  Purpose: To inform patients, and their family members, of our rules and regulations.  Applies to: All patients receiving prescriptions (written or electronic).  Pharmacy of record: Pharmacy where electronic prescriptions will be sent. If written prescriptions are taken to a different pharmacy, please inform the nursing staff. The pharmacy listed in the electronic medical record should be the one where you would like electronic prescriptions to be sent.  Electronic prescriptions: In compliance with the Success Strengthen Opioid Misuse Prevention (STOP) Act of 2017 (Session Law 2017-74/H243), effective March 04, 2018, all controlled substances must be electronically prescribed. Calling prescriptions to the pharmacy will cease to exist.  Prescription refills: Only during scheduled appointments. Applies to all prescriptions.  NOTE: The following applies primarily to controlled substances (Opioid* Pain Medications).   Patient's responsibilities: 1. Pain Pills: Bring all pain pills to every appointment (except for procedure appointments). 2. Pill Bottles: Bring pills in original pharmacy bottle. Always bring the newest bottle. Bring bottle, even if empty. 3. Medication refills: You are responsible for knowing and keeping track of what medications you take and those you need refilled. The day before your appointment: write a list of all prescriptions that need to be refilled. The day of the appointment: give the list to the admitting nurse. Prescriptions will be written only during appointments. No prescriptions will be written on procedure days. If you forget a medication: it will not be "Called in", "Faxed", or "electronically sent". You will need to get another appointment to get these prescribed. No early refills. Do not call asking to have your prescription filled  early. 4. Prescription Accuracy: You are responsible for carefully inspecting your prescriptions before leaving our office. Have the discharge nurse carefully go over each prescription with you, before taking them home. Make sure that your name is accurately spelled, that your address is correct. Check the name and dose of your medication to make sure it is accurate. Check the number of pills, and the written instructions to make sure they are clear and accurate. Make sure that you are given enough medication to last until your next medication refill appointment. 5. Taking Medication: Take medication as prescribed. When it comes to controlled substances, taking less pills or less frequently than prescribed is permitted and encouraged. Never take more pills than instructed. Never take medication more frequently than prescribed.  6. Inform other Doctors: Always inform, all of your healthcare providers, of all the medications you take. 7. Pain Medication from other Providers: You are not allowed to accept any additional pain medication from any other Doctor or Healthcare provider. There are two exceptions to this rule. (see below) In the event that you require additional pain medication, you are responsible for notifying us, as stated below. 8. Medication Agreement: You are responsible for carefully reading and following our Medication Agreement. This must be signed before receiving any prescriptions from our practice. Safely store a copy of your signed Agreement. Violations to the Agreement will result in no further prescriptions. (Additional copies of our Medication Agreement are available upon request.) 9. Laws, Rules, & Regulations: All patients are expected to follow all Federal and State Laws, Statutes, Rules, & Regulations. Ignorance of the Laws does not constitute a valid excuse. The use of any illegal substances is prohibited. 10. Adopted CDC guidelines & recommendations: Target dosing levels will be  at or below 60 MME/day. Use of benzodiazepines** is not recommended.  Exceptions: There are only two exceptions to the rule of not   receiving pain medications from other Healthcare Providers. 1. Exception #1 (Emergencies): In the event of an emergency (i.e.: accident requiring emergency care), you are allowed to receive additional pain medication. However, you are responsible for: As soon as you are able, call our office [phone], at any time of the day or night, and leave a message stating your name, the date and nature of the emergency, and the name and dose of the medication prescribed. In the event that your call is answered by a member of our staff, make sure to document and save the date, time, and the name of the person that took your information.  2. Exception #2 (Planned Surgery): In the event that you are scheduled by another doctor or dentist to have any type of surgery or procedure, you are allowed (for a period no longer than 30 days), to receive additional pain medication, for the acute post-op pain. However, in this case, you are responsible for picking up a copy of our "Post-op Pain Management for Surgeons" handout, and giving it to your surgeon or dentist. This document is available at our office, and does not require an appointment to obtain it. Simply go to our office during business hours (Monday-Thursday from 8:00 AM to 4:00 PM) (Friday 8:00 AM to 12:00 Noon) or if you have a scheduled appointment with us, prior to your surgery, and ask for it by name. In addition, you will need to provide us with your name, name of your surgeon, type of surgery, and date of procedure or surgery.  *Opioid medications include: morphine, codeine, oxycodone, oxymorphone, hydrocodone, hydromorphone, meperidine, tramadol, tapentadol, buprenorphine, fentanyl, methadone. **Benzodiazepine medications include: diazepam (Valium), alprazolam (Xanax), clonazepam (Klonopine), lorazepam (Ativan), clorazepate  (Tranxene), chlordiazepoxide (Librium), estazolam (Prosom), oxazepam (Serax), temazepam (Restoril), triazolam (Halcion) (Last updated: 05/01/2017) ____________________________________________________________________________________________   ____________________________________________________________________________________________  Medication Recommendations and Reminders  Applies to: All patients receiving prescriptions (written and/or electronic).  Medication Rules & Regulations: These rules and regulations exist for your safety and that of others. They are not flexible and neither are we. Dismissing or ignoring them will be considered "non-compliance" with medication therapy, resulting in complete and irreversible termination of such therapy. (See document titled "Medication Rules" for more details.) In all conscience, because of safety reasons, we cannot continue providing a therapy where the patient does not follow instructions.  Pharmacy of record:   Definition: This is the pharmacy where your electronic prescriptions will be sent.   We do not endorse any particular pharmacy.  You are not restricted in your choice of pharmacy.  The pharmacy listed in the electronic medical record should be the one where you want electronic prescriptions to be sent.  If you choose to change pharmacy, simply notify our nursing staff of your choice of new pharmacy.  Recommendations:  Keep all of your pain medications in a safe place, under lock and key, even if you live alone.   After you fill your prescription, take 1 week's worth of pills and put them away in a safe place. You should keep a separate, properly labeled bottle for this purpose. The remainder should be kept in the original bottle. Use this as your primary supply, until it runs out. Once it's gone, then you know that you have 1 week's worth of medicine, and it is time to come in for a prescription refill. If you do this correctly, it  is unlikely that you will ever run out of medicine.  To make sure that the above recommendation works,   it is very important that you make sure your medication refill appointments are scheduled at least 1 week before you run out of medicine. To do this in an effective manner, make sure that you do not leave the office without scheduling your next medication management appointment. Always ask the nursing staff to show you in your prescription , when your medication will be running out. Then arrange for the receptionist to get you a return appointment, at least 7 days before you run out of medicine. Do not wait until you have 1 or 2 pills left, to come in. This is very poor planning and does not take into consideration that we may need to cancel appointments due to bad weather, sickness, or emergencies affecting our staff.  "Partial Fill": If for any reason your pharmacy does not have enough pills/tablets to completely fill or refill your prescription, do not allow for a "partial fill". You will need a separate prescription to fill the remaining amount, which we will not provide. If the reason for the partial fill is your insurance, you will need to talk to the pharmacist about payment alternatives for the remaining tablets, but again, do not accept a partial fill.  Prescription refills and/or changes in medication(s):   Prescription refills, and/or changes in dose or medication, will be conducted only during scheduled medication management appointments. (Applies to both, written and electronic prescriptions.)  No refills on procedure days. No medication will be changed or started on procedure days. No changes, adjustments, and/or refills will be conducted on a procedure day. Doing so will interfere with the diagnostic portion of the procedure.  No phone refills. No medications will be "called into the pharmacy".  No Fax refills.  No weekend refills.  No Holliday refills.  No after hours  refills.  Remember:  Business hours are:  Monday to Thursday 8:00 AM to 4:00 PM Provider's Schedule: Crystal King, NP - Appointments are:  Medication management: Monday to Thursday 8:00 AM to 4:00 PM Eura Mccauslin, MD - Appointments are:  Medication management: Monday and Wednesday 8:00 AM to 4:00 PM Procedure day: Tuesday and Thursday 7:30 AM to 4:00 PM Bilal Lateef, MD - Appointments are:  Medication management: Tuesday and Thursday 8:00 AM to 4:00 PM Procedure day: Monday and Wednesday 7:30 AM to 4:00 PM (Last update: 05/01/2017) ____________________________________________________________________________________________   

## 2018-08-27 DIAGNOSIS — E559 Vitamin D deficiency, unspecified: Secondary | ICD-10-CM | POA: Diagnosis not present

## 2018-08-27 DIAGNOSIS — E538 Deficiency of other specified B group vitamins: Secondary | ICD-10-CM | POA: Diagnosis not present

## 2018-08-27 DIAGNOSIS — D649 Anemia, unspecified: Secondary | ICD-10-CM | POA: Diagnosis not present

## 2018-08-27 DIAGNOSIS — E039 Hypothyroidism, unspecified: Secondary | ICD-10-CM | POA: Diagnosis not present

## 2018-09-09 ENCOUNTER — Other Ambulatory Visit: Payer: Self-pay

## 2018-09-09 ENCOUNTER — Ambulatory Visit: Payer: Self-pay | Admitting: Psychiatry

## 2018-09-09 DIAGNOSIS — K900 Celiac disease: Secondary | ICD-10-CM | POA: Diagnosis not present

## 2018-09-09 DIAGNOSIS — M79642 Pain in left hand: Secondary | ICD-10-CM | POA: Diagnosis not present

## 2018-09-09 DIAGNOSIS — E559 Vitamin D deficiency, unspecified: Secondary | ICD-10-CM | POA: Diagnosis not present

## 2018-09-09 DIAGNOSIS — G894 Chronic pain syndrome: Secondary | ICD-10-CM | POA: Diagnosis not present

## 2018-09-09 DIAGNOSIS — M2550 Pain in unspecified joint: Secondary | ICD-10-CM | POA: Diagnosis not present

## 2018-09-09 DIAGNOSIS — M79641 Pain in right hand: Secondary | ICD-10-CM | POA: Diagnosis not present

## 2018-09-23 ENCOUNTER — Inpatient Hospital Stay (HOSPITAL_COMMUNITY): Payer: BC Managed Care – PPO | Attending: Hematology | Admitting: Hematology

## 2018-09-23 ENCOUNTER — Other Ambulatory Visit: Payer: Self-pay

## 2018-09-23 ENCOUNTER — Encounter (HOSPITAL_COMMUNITY): Payer: Self-pay | Admitting: Hematology

## 2018-09-23 ENCOUNTER — Inpatient Hospital Stay (HOSPITAL_COMMUNITY): Payer: BC Managed Care – PPO

## 2018-09-23 DIAGNOSIS — D3501 Benign neoplasm of right adrenal gland: Secondary | ICD-10-CM | POA: Insufficient documentation

## 2018-09-23 DIAGNOSIS — N912 Amenorrhea, unspecified: Secondary | ICD-10-CM | POA: Diagnosis not present

## 2018-09-23 DIAGNOSIS — F419 Anxiety disorder, unspecified: Secondary | ICD-10-CM | POA: Diagnosis not present

## 2018-09-23 DIAGNOSIS — R7989 Other specified abnormal findings of blood chemistry: Secondary | ICD-10-CM | POA: Insufficient documentation

## 2018-09-23 DIAGNOSIS — Z87891 Personal history of nicotine dependence: Secondary | ICD-10-CM | POA: Insufficient documentation

## 2018-09-23 DIAGNOSIS — D509 Iron deficiency anemia, unspecified: Secondary | ICD-10-CM | POA: Diagnosis not present

## 2018-09-23 DIAGNOSIS — Z832 Family history of diseases of the blood and blood-forming organs and certain disorders involving the immune mechanism: Secondary | ICD-10-CM | POA: Insufficient documentation

## 2018-09-23 DIAGNOSIS — K760 Fatty (change of) liver, not elsewhere classified: Secondary | ICD-10-CM | POA: Diagnosis not present

## 2018-09-23 DIAGNOSIS — Z79899 Other long term (current) drug therapy: Secondary | ICD-10-CM | POA: Insufficient documentation

## 2018-09-23 DIAGNOSIS — M549 Dorsalgia, unspecified: Secondary | ICD-10-CM | POA: Insufficient documentation

## 2018-09-23 DIAGNOSIS — R200 Anesthesia of skin: Secondary | ICD-10-CM | POA: Diagnosis not present

## 2018-09-23 DIAGNOSIS — Z8719 Personal history of other diseases of the digestive system: Secondary | ICD-10-CM | POA: Diagnosis not present

## 2018-09-23 DIAGNOSIS — M2550 Pain in unspecified joint: Secondary | ICD-10-CM | POA: Diagnosis not present

## 2018-09-23 DIAGNOSIS — E538 Deficiency of other specified B group vitamins: Secondary | ICD-10-CM | POA: Insufficient documentation

## 2018-09-23 DIAGNOSIS — D3502 Benign neoplasm of left adrenal gland: Secondary | ICD-10-CM | POA: Insufficient documentation

## 2018-09-23 DIAGNOSIS — Z7982 Long term (current) use of aspirin: Secondary | ICD-10-CM | POA: Diagnosis not present

## 2018-09-23 DIAGNOSIS — K900 Celiac disease: Secondary | ICD-10-CM | POA: Diagnosis not present

## 2018-09-23 DIAGNOSIS — Z86010 Personal history of colonic polyps: Secondary | ICD-10-CM | POA: Diagnosis not present

## 2018-09-23 DIAGNOSIS — R232 Flushing: Secondary | ICD-10-CM | POA: Insufficient documentation

## 2018-09-23 DIAGNOSIS — F909 Attention-deficit hyperactivity disorder, unspecified type: Secondary | ICD-10-CM | POA: Diagnosis not present

## 2018-09-23 DIAGNOSIS — I252 Old myocardial infarction: Secondary | ICD-10-CM | POA: Diagnosis not present

## 2018-09-23 DIAGNOSIS — I2510 Atherosclerotic heart disease of native coronary artery without angina pectoris: Secondary | ICD-10-CM | POA: Diagnosis not present

## 2018-09-23 DIAGNOSIS — R112 Nausea with vomiting, unspecified: Secondary | ICD-10-CM | POA: Insufficient documentation

## 2018-09-23 DIAGNOSIS — D649 Anemia, unspecified: Secondary | ICD-10-CM | POA: Insufficient documentation

## 2018-09-23 DIAGNOSIS — Z800 Family history of malignant neoplasm of digestive organs: Secondary | ICD-10-CM | POA: Diagnosis not present

## 2018-09-23 LAB — IRON AND TIBC
Iron: 43 ug/dL (ref 28–170)
Saturation Ratios: 9 % — ABNORMAL LOW (ref 10.4–31.8)
TIBC: 471 ug/dL — ABNORMAL HIGH (ref 250–450)
UIBC: 428 ug/dL

## 2018-09-23 LAB — LACTATE DEHYDROGENASE: LDH: 121 U/L (ref 98–192)

## 2018-09-23 LAB — RETICULOCYTES
Immature Retic Fract: 13.8 % (ref 2.3–15.9)
RBC.: 4.63 MIL/uL (ref 3.87–5.11)
Retic Count, Absolute: 70.4 10*3/uL (ref 19.0–186.0)
Retic Ct Pct: 1.5 % (ref 0.4–3.1)

## 2018-09-23 LAB — FERRITIN: Ferritin: 9 ng/mL — ABNORMAL LOW (ref 11–307)

## 2018-09-23 LAB — C-REACTIVE PROTEIN: CRP: <0.8 mg/dL (ref <1.0)

## 2018-09-23 LAB — SEDIMENTATION RATE: Sed Rate: 7 mm/hr (ref 0–22)

## 2018-09-23 NOTE — Progress Notes (Signed)
CONSULT NOTE  Sharon Lawrence Care Team: Vidal Schwalbe, MD as PCP - General (Family Medicine) Harl Bowie Alphonse Guild, MD as PCP - Cardiology (Cardiology)  CHIEF COMPLAINTS/PURPOSE OF CONSULTATION:  Microcytic anemia, low folic acid and elevated platelet count.  HISTORY OF PRESENTING ILLNESS:  Sharon Lawrence 53 y.o. female is seen for evaluation of microcytic anemia, low folic acid and elevated platelet counts at the request of Dr. Bartolo Darter at [facility] family medicine.  Recent labs from 08/06/2018 shows hemoglobin 11.4, MCV 78, elevated RDW.  Serum iron was 48, percent saturation was low at 12.  Folic acid was low at 3.3.  B12 was normal at 486.  Sharon Lawrence has been taking B12 injections once a month.  Sharon Lawrence also takes folic acid 1 mg twice daily for several years.  Denies any bleeding per rectum or melena.  Sharon Lawrence does report occasional hot flashes.  Sharon Lawrence has not been menstruating for the last 5 years.  Sharon Lawrence reported recent tiredness.  Joint pains started about 4 months ago, in all joints.  Sharon Lawrence was evaluated by [facility] rheumatology 2 weeks ago.  Sharon Lawrence also reports stiffness of the joints for the first 2 hours of the day.  Sharon Lawrence has tried taking iron pills in the past with constipation and nausea.  Colonoscopy was in 2019 with no significant findings of bleeding.  Denies any hospitalizations or ER visits.  No family history of connective tissue disorders.  Mother has myeloproliferative neoplasm.  Sister has rectal cancer.  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  MEDICAL HISTORY:  Past Medical History:  Diagnosis Date  . ADHD (attention deficit hyperactivity disorder)   . Anxiety   . B12 deficiency 02/07/2011  . Back pain   . CAD (coronary artery disease)    a. s/p recent STEMI on 09/15/2017 with DES to RCA  . Celiac disease   . Folic acid deficiency 82/07/537  . Headache   . Iron deficiency anemia 02/07/2011  . Myocardial infarction ([place]) 09/15/2017    SURGICAL HISTORY: Past Surgical History:  Procedure Laterality  Date  . CHOLECYSTECTOMY    . COLONOSCOPY WITH PROPOFOL N/A 09/26/2017   Procedure: COLONOSCOPY WITH PROPOFOL;  Surgeon: Rogene Houston, MD;  [facility];  Service: Endoscopy;  Laterality: N/A;  . CORONARY STENT INTERVENTION Right 09/15/2017   Procedure: CORONARY STENT INTERVENTION;  Surgeon: Jettie Booze, MD;  Location: [facility];  Service: Cardiovascular;  Laterality: Right;  RCA  . KNEE CARTILAGE SURGERY Right   . LEFT HEART CATH AND CORONARY ANGIOGRAPHY N/A 09/15/2017   Procedure: LEFT HEART CATH AND CORONARY ANGIOGRAPHY;  Surgeon: Jettie Booze, MD;  Location: [facility];  Service: Cardiovascular;  Laterality: N/A;  . TONSILLECTOMY      SOCIAL HISTORY: Social History   Socioeconomic History  . Marital status: Widowed    Spouse name: Not on file  . Number of children: 3  . Years of education: Not on file  . Highest education level: Associate degree: occupational, Hotel manager, or vocational program  Occupational History  . Not on file  Social Needs  . Financial resource strain: Not hard at all  . Food insecurity    Worry: Never true    Inability: Never true  . Transportation needs    Medical: No    Non-medical: No  Tobacco Use  . Smoking status: Former Smoker    Packs/day: 0.25    Years: 12.00    Pack years: 3.00    Types:  Cigarettes    Quit date: 04/04/2017    Years since quitting: 1.4  . Smokeless tobacco: Never Used  Substance and Sexual Activity  . Alcohol use: No  . Drug use: No  . Sexual activity: Not Currently  Lifestyle  . Physical activity    Days per week: 3 days    Minutes per session: 30 min  . Stress: Very much  Relationships  . Social connections    Talks on phone: More than three times a week    Gets together: More than three times a week    Attends religious service: More than 4 times per year    Active member of club or organization: No    Attends meetings of clubs or organizations: Never    Relationship  status: Widowed  . Intimate partner violence    Fear of current or ex partner: No    Emotionally abused: No    Physically abused: No    Forced sexual activity: No  Other Topics Concern  . Not on file  Social History Narrative  . Not on file    FAMILY HISTORY: Family History  Problem Relation Age of Onset  . Cancer Mother   . Hypertension Mother   . Hypertension Father   . Hypothyroidism Father   . Cancer Sister   . Anxiety disorder Sister   . Depression Sister   . CAD Maternal Grandfather   . Cirrhosis Paternal Grandfather   . CAD Paternal Grandmother   . Diabetes Paternal Grandmother   . Hypothyroidism Paternal Grandmother   . Drug abuse Son   . Bipolar disorder Son     ALLERGIES:  is allergic to bee venom; erythromycin; cephalexin; neomycin-bacitracin-polymyxin  [bacitracin-neomycin-polymyxin]; neosporin original [bacitracin-neomycin-polymyxin]; prednisone; sulfa antibiotics; tobrex [tobramycin]; tramadol; and penicillins.  MEDICATIONS:  Current Outpatient Medications  Medication Sig Dispense Refill  . aspirin 81 MG chewable tablet Chew 1 tablet (81 mg total) by mouth daily.    Marland Kitchen atorvastatin (LIPITOR) 40 MG tablet TAKE 1 TABLET BY MOUTH ONCE DAILY AT 6PM. 30 tablet 6  . baclofen (LIORESAL) 20 MG tablet Take 1 tablet (20 mg total) by mouth daily. 30 tablet 2  . cholecalciferol (VITAMIN D) 1000 units tablet Take 5,000 Units by mouth daily.    . clonazePAM (KLONOPIN) 0.5 MG tablet Take 0.5-1 tablets (0.25-0.5 mg total) by mouth as directed. 1-2 times a week as needed for severe panic sx. 20 tablet 1  . clopidogrel (PLAVIX) 75 MG tablet TAKE 1 TABLET BY MOUTH ONCE DAILY. 30 tablet 6  . Cyanocobalamin (VITAMIN B-12 IJ) Inject as directed every 30 (thirty) days.     . DULoxetine (CYMBALTA) 60 MG capsule Take 1 capsule (60 mg total) by mouth daily. 90 capsule 0  . fluticasone (FLONASE) 50 MCG/ACT nasal spray Place into the nose.    . folic acid (FOLVITE) 1 MG tablet Take 1 mg  by mouth 2 (two) times a day.    . gabapentin (NEURONTIN) 100 MG capsule Take 1 capsule (100 mg total) by mouth at bedtime. 30 capsule 2  . gabapentin (NEURONTIN) 800 MG tablet Take 1 tablet (800 mg total) by mouth at bedtime. 90 tablet 0  . ibuprofen (ADVIL,MOTRIN) 800 MG tablet     . lactobacillus acidophilus (BACID) TABS tablet Take 1 tablet by mouth daily.    Marland Kitchen levonorgestrel (MIRENA) 20 MCG/24HR IUD by Intrauterine route.    . Melatonin 3-10 MG TABS Take 50 mg by mouth Nightly.    . metoprolol tartrate (  LOPRESSOR) 25 MG tablet Take 1 tablet (25 mg total) by mouth 2 (two) times daily. 180 tablet 3  . nitroGLYCERIN (NITROSTAT) 0.4 MG SL tablet Place 1 tablet (0.4 mg total) under the tongue every 5 (five) minutes as needed for chest pain. 25 tablet 2  . nystatin (MYCOSTATIN/NYSTOP) powder Apply topically.    . ondansetron (ZOFRAN-ODT) 8 MG disintegrating tablet Take 1 tablet by mouth 3 (three) times daily as needed for nausea.   3  . oxyCODONE (OXY IR/ROXICODONE) 5 MG immediate release tablet Take 1 tablet (5 mg total) by mouth every 6 (six) hours as needed for up to 30 days for severe pain. Must last 30 days. 120 tablet 0  . [START ON 10/01/2018] oxyCODONE (OXY IR/ROXICODONE) 5 MG immediate release tablet Take 1 tablet (5 mg total) by mouth every 6 (six) hours as needed for up to 30 days for severe pain. Must last 30 days. 120 tablet 0  . [START ON 10/31/2018] oxyCODONE (OXY IR/ROXICODONE) 5 MG immediate release tablet Take 1 tablet (5 mg total) by mouth every 6 (six) hours as needed for up to 30 days for severe pain. Must last 30 days. 120 tablet 0  . pantoprazole (PROTONIX) 40 MG tablet TAKE 1 TABLET BY MOUTH ONCE DAILY BEFORE BREAKFAST. 30 tablet 6  . promethazine (PHENERGAN) 25 MG tablet Take 25 mg by mouth 3 (three) times daily as needed for nausea.   2  . torsemide (DEMADEX) 20 MG tablet   2  . Vitamin D, Ergocalciferol, (DRISDOL) 1.25 MG (50000 UT) CAPS capsule 1 cap weekly 12 weeks, 12 caps      No current facility-administered medications for this visit.     REVIEW OF SYSTEMS:   Constitutional: Denies fevers, chills or abnormal night sweats Eyes: Denies blurriness of vision, double vision or watery eyes Ears, nose, mouth, throat, and face: Denies mucositis or sore throat Respiratory: Denies cough, dyspnea or wheezes Cardiovascular: Denies palpitation, chest discomfort or lower extremity swelling Gastrointestinal: Sharon Lawrence reports nausea and vomiting 1-2 times per week. Skin: Denies abnormal skin rashes Lymphatics: Denies new lymphadenopathy or easy bruising Neurological: Positive for numbness in the hands and feet. Behavioral/Psych: Mood is stable, no new changes  All other systems were reviewed with the Sharon Lawrence and are negative.  PHYSICAL EXAMINATION: ECOG PERFORMANCE STATUS: 0 - Asymptomatic  Vitals:   09/23/18 1304  BP: 138/78  Pulse: 74  Resp: 18  Temp: (!) 97.3 F (36.3 C)  SpO2: 98%   Filed Weights   09/23/18 1304  Weight: 219 lb (99.3 kg)    GENERAL:alert, no distress and comfortable SKIN: skin color, texture, turgor are normal, no rashes or significant lesions EYES: normal, conjunctiva are pink and non-injected, sclera clear OROPHARYNX:no exudate, no erythema and lips, buccal mucosa, and tongue normal  NECK: supple, thyroid normal size, non-tender, without nodularity LYMPH:  no palpable lymphadenopathy in the cervical, axillary or inguinal LUNGS: clear to auscultation and percussion with normal breathing effort HEART: regular rate & rhythm and no murmurs and no lower extremity edema ABDOMEN:abdomen soft, non-tender and normal bowel sounds Musculoskeletal:no cyanosis of digits and no clubbing  PSYCH: alert & oriented x 3 with fluent speech NEURO: no focal motor/sensory deficits  LABORATORY DATA:  I have reviewed the data as listed Recent Results (from the past 2160 hour(s))  Lactate dehydrogenase     Status: None   Collection Time: 09/23/18  2:05 PM   Result Value Ref Range   LDH 121 98 - 192 U/L  Comment: Performed at [facility], [address], [place], [place] [address]  Reticulocytes     Status: None   Collection Time: 09/23/18  2:05 PM  Result Value Ref Range   Retic Ct Pct 1.5 0.4 - 3.1 %   RBC. 4.63 3.87 - 5.11 MIL/uL   Retic Count, Absolute 70.4 19.0 - 186.0 K/uL   Immature Retic Fract 13.8 2.3 - 15.9 %    Comment: Performed at [facility], [address], [place], [place] [address]  Ferritin     Status: Abnormal   Collection Time: 09/23/18  2:05 PM  Result Value Ref Range   Ferritin 9 (L) 11 - 307 ng/mL    Comment: Performed at [facility], [address], [place], [place] [address]  Iron and TIBC     Status: Abnormal   Collection Time: 09/23/18  2:05 PM  Result Value Ref Range   Iron 43 28 - 170 ug/dL   TIBC 471 (H) 250 - 450 ug/dL   Saturation Ratios 9 (L) 10.4 - 31.8 %   UIBC 428 ug/dL    Comment: Performed at [facility], [address], [place], [place] [address]  Sedimentation rate     Status: None   Collection Time: 09/23/18  2:05 PM  Result Value Ref Range   Sed Rate 7 0 - 22 mm/hr    Comment: Performed at [facility], [address], [place], [place] [address]  C-reactive protein     Status: None   Collection Time: 09/23/18  2:05 PM  Result Value Ref Range   CRP <0.8 <1.0 mg/dL    Comment: Performed at [facility], [address], [place], [place] [address]    RADIOGRAPHIC STUDIES: I have personally reviewed the radiological images as listed and agreed with the findings in the report.  ASSESSMENT & PLAN:  Microcytic anemia 1.  Microcytic anemia: - Labs on 08/06/2018 from Dr. Arliss Journey office shows hemoglobin of 11.4 with MCV of 78.  MCH was also low.  RDW was elevated. - Serum iron was 48, percent saturation was low at 12. -Colonoscopy by Dr. Laural Golden was on 09/26/2017 showed normal ileum, 5 small nonbleeding polyps in the rectum, in the sigmoid colon and hepatic flexure, external hemorrhoids. -EGD was  on 03/29/2010 which showed abnormal duodenal mucosa. -Sharon Lawrence reportedly had Feraheme infusion in 2012 and 2014.  Oral iron therapy resulted in constipation and nausea and intolerance. -We will check ferritin and iron panel today.  We will also check copper level, SPEP, LDH, reticulocyte count. -Sharon Lawrence reportedly developed pains in all her joints for the last 4 months.  Evaluated by [facility] rheumatology about 2 weeks ago.  Sharon Lawrence has a follow-up with them next week to discuss the results.  Sharon Lawrence does report tiredness.  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  2.  Folate deficiency: - Folic acid on 04/10/8339 was low at 3.3.  Vitamin B12 was normal at 486. -Sharon Lawrence has been taking folic acid 1 mg twice daily for many years.  Sharon Lawrence also takes B12 injection once a month. - We will send RBC folate and methylmalonic acid level today.  3.  Thrombocytosis: -Platelet count was elevated at 525.  This could be reactive from underlying iron deficiency. -CTAP on 09/25/2017 shows normal-sized spleen without focal abnormality.  Fatty infiltration of the liver noted.  Stable bilateral adrenal adenomas. - If Sharon Lawrence continues to have elevated platelet count, will consider mutation testing for myeloproliferative disorders. -Her mother has Jak 2+ myeloproliferative  neoplasm.     All questions were answered. The Sharon Lawrence knows to call the clinic with any problems, questions or concerns.      Derek Jack, MD 09/23/18 5:07 PM

## 2018-09-23 NOTE — Patient Instructions (Signed)
[place] at [facility] Discharge Instructions  You were seen today by Dr. Delton Coombes. He went over your history, family history and how you've been feeling lately. He will have blood work drawn today. He will see you back in 3 weeks for follow up.   Thank you for choosing [place] at [facility] to provide your oncology and hematology care.  To afford each patient quality time with our provider, please arrive at least 15 minutes before your scheduled appointment time.   If you have a lab appointment with the [facility] please come in thru the  Main Entrance and check in at the main information desk  You need to re-schedule your appointment should you arrive 10 or more minutes late.  We strive to give you quality time with our providers, and arriving late affects you and other patients whose appointments are after yours.  Also, if you no show three or more times for appointments you may be dismissed from the clinic at the providers discretion.     Again, thank you for choosing [facility].  Our hope is that these requests will decrease the amount of time that you wait before being seen by our physicians.       _____________________________________________________________  Should you have questions after your visit to [facility], please contact our office at [phone] between the hours of 8:00 a.m. and 4:30 p.m.  Voicemails left after 4:00 p.m. will not be returned until the following business day.  For prescription refill requests, have your pharmacy contact our office and allow 72 hours.    Cancer Center Support Programs:   > Cancer Support Group  2nd Tuesday of the month 1pm-2pm, Journey Room

## 2018-09-23 NOTE — Assessment & Plan Note (Addendum)
1.  Microcytic anemia: - Labs on 08/06/2018 from Dr. Arliss Journey office shows hemoglobin of 11.4 with MCV of 78.  MCH was also low.  RDW was elevated. - Serum iron was 48, percent saturation was low at 12. -Colonoscopy by Dr. Laural Golden was on 09/26/2017 showed normal ileum, 5 small nonbleeding polyps in the rectum, in the sigmoid colon and hepatic flexure, external hemorrhoids. -EGD was on 03/29/2010 which showed abnormal duodenal mucosa. -Patient reportedly had Feraheme infusion in 2012 and 2014.  Oral iron therapy resulted in constipation and nausea and intolerance. -We will check ferritin and iron panel today.  We will also check copper level, SPEP, LDH, reticulocyte count. -She reportedly developed pains in all her joints for the last 4 months.  Evaluated by [facility] rheumatology about 2 weeks ago.  She has a follow-up with them next week to discuss the results.  She does report tiredness.  She works as a Marine scientist at [employer].  2.  Folate deficiency: - Folic acid on 10/08/3815 was low at 3.3.  Vitamin B12 was normal at 486. -She has been taking folic acid 1 mg twice daily for many years.  She also takes B12 injection once a month. - We will send RBC folate and methylmalonic acid level today.  3.  Thrombocytosis: -Platelet count was elevated at 525.  This could be reactive from underlying iron deficiency. -CTAP on 09/25/2017 shows normal-sized spleen without focal abnormality.  Fatty infiltration of the liver noted.  Stable bilateral adrenal adenomas. - If she continues to have elevated platelet count, will consider mutation testing for myeloproliferative disorders. -Her mother has Jak 2+ myeloproliferative neoplasm.

## 2018-09-24 LAB — PROTEIN ELECTROPHORESIS, SERUM
A/G Ratio: 1.5 (ref 0.7–1.7)
Albumin ELP: 3.6 g/dL (ref 2.9–4.4)
Alpha-1-Globulin: 0.2 g/dL (ref 0.0–0.4)
Alpha-2-Globulin: 0.8 g/dL (ref 0.4–1.0)
Beta Globulin: 0.8 g/dL (ref 0.7–1.3)
Gamma Globulin: 0.6 g/dL (ref 0.4–1.8)
Globulin, Total: 2.4 g/dL (ref 2.2–3.9)
Total Protein ELP: 6 g/dL (ref 6.0–8.5)

## 2018-09-25 LAB — METHYLMALONIC ACID, SERUM: Methylmalonic Acid, Quantitative: 143 nmol/L (ref 0–378)

## 2018-09-25 LAB — COPPER, SERUM: Copper: 124 ug/dL (ref 72–166)

## 2018-09-30 DIAGNOSIS — D508 Other iron deficiency anemias: Secondary | ICD-10-CM | POA: Diagnosis not present

## 2018-09-30 DIAGNOSIS — E559 Vitamin D deficiency, unspecified: Secondary | ICD-10-CM | POA: Diagnosis not present

## 2018-09-30 DIAGNOSIS — M2550 Pain in unspecified joint: Secondary | ICD-10-CM | POA: Diagnosis not present

## 2018-09-30 DIAGNOSIS — D75839 Thrombocytosis, unspecified: Secondary | ICD-10-CM | POA: Insufficient documentation

## 2018-09-30 DIAGNOSIS — D473 Essential (hemorrhagic) thrombocythemia: Secondary | ICD-10-CM | POA: Insufficient documentation

## 2018-09-30 DIAGNOSIS — G894 Chronic pain syndrome: Secondary | ICD-10-CM | POA: Diagnosis not present

## 2018-10-06 ENCOUNTER — Ambulatory Visit (INDEPENDENT_AMBULATORY_CARE_PROVIDER_SITE_OTHER): Payer: Self-pay | Admitting: Psychiatry

## 2018-10-06 ENCOUNTER — Encounter: Payer: Self-pay | Admitting: Psychiatry

## 2018-10-06 ENCOUNTER — Other Ambulatory Visit: Payer: Self-pay

## 2018-10-06 DIAGNOSIS — F410 Panic disorder [episodic paroxysmal anxiety] without agoraphobia: Secondary | ICD-10-CM

## 2018-10-06 DIAGNOSIS — F330 Major depressive disorder, recurrent, mild: Secondary | ICD-10-CM

## 2018-10-06 DIAGNOSIS — F5105 Insomnia due to other mental disorder: Secondary | ICD-10-CM

## 2018-10-06 DIAGNOSIS — F411 Generalized anxiety disorder: Secondary | ICD-10-CM

## 2018-10-06 MED ORDER — DULOXETINE HCL 60 MG PO CPEP: 60.0000 mg | ORAL_CAPSULE | Freq: Every day | ORAL | 0 refills | Status: DC

## 2018-10-06 MED ORDER — CLONAZEPAM 0.5 MG PO TABS: 0.2500 mg | ORAL_TABLET | ORAL | 1 refills | Status: DC

## 2018-10-06 NOTE — Progress Notes (Signed)
Virtual Visit via Video Note  I connected with Sharon Lawrence on 10/06/18 at 10:00 AM EDT by a video enabled telemedicine application and verified that I am speaking with the correct person using two identifiers.   I discussed the limitations of evaluation and management by telemedicine and the availability of in person appointments. The patient expressed understanding and agreed to proceed.   I discussed the assessment and treatment plan with the patient. The patient was provided an opportunity to ask questions and all were answered. The patient agreed with the plan and demonstrated an understanding of the instructions.   The patient was advised to call back or seek an in-person evaluation if the symptoms worsen or if the condition fails to improve as anticipated.   [place] MD OP Progress Note  10/06/2018 11:17 AM Sharon Lawrence  MRN:  [id number]  Chief Complaint:  Chief Complaint    Follow-up     HPI: Sharon Lawrence is a 53 year old Caucasian female who is widowed, employed as a Marine scientist, lives in [place], has a history of coronary artery disease status post STEMI on 09/15/2017, status post stent placement, hyperlipidemia, chronic anemia was evaluated by telemedicine today.  Patient today reports she is currently struggling with some worsening pain.  She does have fibromyalgia and is currently going through a flareup.  She reports she has reached out to her providers to give her an earlier appointment to make medication changes.  She reports because of the pain she has noticed that her anxiety symptoms have gotten worse.  She however reports she does not want to make any dosage changes with her Cymbalta today.  She wants to address her pain first before she does that.  She does have some worsening panic attacks more so because of the pain.  She has been coping with it by using deep breathing and relaxation techniques.  She has not been using Klonopin much.  The last prescription for clonazepam was given several  months ago.  Patient reports she continues to do well at work.  Her son is more supportive now and that is a relief.  She denies any other concerns today. Visit Diagnosis:    ICD-10-CM   1. Panic disorder  F41.0 clonazePAM (KLONOPIN) 0.5 MG tablet    DULoxetine (CYMBALTA) 60 MG capsule  2. GAD (generalized anxiety disorder)  F41.1 clonazePAM (KLONOPIN) 0.5 MG tablet    DULoxetine (CYMBALTA) 60 MG capsule  3. MDD (major depressive disorder), recurrent episode, mild (HCC)  F33.0 DULoxetine (CYMBALTA) 60 MG capsule  4. Insomnia due to mental disorder  F51.05     Past Psychiatric History: I have reviewed past psychiatric history from my progress note on 11/17/2017.  Past trials of Zoloft, Paxil, trazodone.  Past Medical History:  Past Medical History:  Diagnosis Date  . ADHD (attention deficit hyperactivity disorder)   . Anxiety   . B12 deficiency 02/07/2011  . Back pain   . CAD (coronary artery disease)    a. s/p recent STEMI on 09/15/2017 with DES to RCA  . Celiac disease   . Folic acid deficiency 76/09/2092  . Headache   . Iron deficiency anemia 02/07/2011  . Myocardial infarction ([place]) 09/15/2017    Past Surgical History:  Procedure Laterality Date  . CHOLECYSTECTOMY    . COLONOSCOPY WITH PROPOFOL N/A 09/26/2017   Procedure: COLONOSCOPY WITH PROPOFOL;  Surgeon: Rogene Houston, MD;  [facility];  Service: Endoscopy;  Laterality: N/A;  . CORONARY STENT INTERVENTION Right 09/15/2017  Procedure: CORONARY STENT INTERVENTION;  Surgeon: Jettie Booze, MD;  Location: [place] CV LAB;  Service: Cardiovascular;  Laterality: Right;  RCA  . KNEE CARTILAGE SURGERY Right   . LEFT HEART CATH AND CORONARY ANGIOGRAPHY N/A 09/15/2017   Procedure: LEFT HEART CATH AND CORONARY ANGIOGRAPHY;  Surgeon: Jettie Booze, MD;  Location: [place] CV LAB;  Service: Cardiovascular;  Laterality: N/A;  . TONSILLECTOMY      Family Psychiatric History: I have reviewed family  psychiatric history from my progress note on 11/17/2017.  Family History:  Family History  Problem Relation Age of Onset  . Cancer Mother   . Hypertension Mother   . Hypertension Father   . Hypothyroidism Father   . Cancer Sister   . Anxiety disorder Sister   . Depression Sister   . CAD Maternal Grandfather   . Cirrhosis Paternal Grandfather   . CAD Paternal Grandmother   . Diabetes Paternal Grandmother   . Hypothyroidism Paternal Grandmother   . Drug abuse Son   . Bipolar disorder Son     Social History: I have reviewed social history from my progress note from 11/17/2017 Social History   Socioeconomic History  . Marital status: Widowed    Spouse name: Not on file  . Number of children: 3  . Years of education: Not on file  . Highest education level: Associate degree: occupational, Hotel manager, or vocational program  Occupational History  . Not on file  Social Needs  . Financial resource strain: Not hard at all  . Food insecurity    Worry: Never true    Inability: Never true  . Transportation needs    Medical: No    Non-medical: No  Tobacco Use  . Smoking status: Former Smoker    Packs/day: 0.25    Years: 12.00    Pack years: 3.00    Types: Cigarettes    Quit date: 04/04/2017    Years since quitting: 1.5  . Smokeless tobacco: Never Used  Substance and Sexual Activity  . Alcohol use: No  . Drug use: No  . Sexual activity: Not Currently  Lifestyle  . Physical activity    Days per week: 3 days    Minutes per session: 30 min  . Stress: Very much  Relationships  . Social connections    Talks on phone: More than three times a week    Gets together: More than three times a week    Attends religious service: More than 4 times per year    Active member of club or organization: No    Attends meetings of clubs or organizations: Never    Relationship status: Widowed  Other Topics Concern  . Not on file  Social History Narrative  . Not on file    Allergies:   Allergies  Allergen Reactions  . Bee Venom Anaphylaxis  . Erythromycin Anaphylaxis  . Cephalexin   . Neomycin-Bacitracin-Polymyxin  [Bacitracin-Neomycin-Polymyxin] Swelling    Sight of application  . Neosporin Original [Bacitracin-Neomycin-Polymyxin] Swelling    Swelling at site of application  . Prednisone   . Sulfa Antibiotics Swelling    Sulfa eye drops caused eyes to swell Sulfa eye drops caused eyes to swell  . Tobrex [Tobramycin] Swelling  . Tramadol Hives  . Penicillins Rash    Has patient had a PCN reaction causing immediate rash, facial/tongue/throat swelling, SOB or lightheadedness with hypotension: Yes Has patient had a PCN reaction causing severe rash involving mucus membranes or skin necrosis: No Has  patient had a PCN reaction that required hospitalization: No Has patient had a PCN reaction occurring within the last 10 years: No If all of the above answers are "NO", then may proceed with Cephalosporin use.     Metabolic Disorder Labs: Lab Results  Component Value Date   HGBA1C 5.6 09/16/2017   MPG 114 09/16/2017   MPG 123 (H) 07/07/2013   No results found for: PROLACTIN Lab Results  Component Value Date   CHOL 77 09/16/2017   TRIG 64 09/16/2017   HDL 19 (L) 09/16/2017   CHOLHDL 4.1 09/16/2017   VLDL 13 09/16/2017   LDLCALC 45 09/16/2017   LDLCALC 97 07/07/2013   Lab Results  Component Value Date   TSH 2.471 07/07/2013   TSH 1.745 06/03/2012    Therapeutic Level Labs: No results found for: LITHIUM No results found for: VALPROATE No components found for:  CBMZ  Current Medications: Current Outpatient Medications  Medication Sig Dispense Refill  . aspirin 81 MG chewable tablet Chew 1 tablet (81 mg total) by mouth daily.    Marland Kitchen atorvastatin (LIPITOR) 40 MG tablet TAKE 1 TABLET BY MOUTH ONCE DAILY AT 6PM. 30 tablet 6  . baclofen (LIORESAL) 20 MG tablet Take 1 tablet (20 mg total) by mouth daily. 30 tablet 2  . cholecalciferol (VITAMIN D) 1000 units  tablet Take 5,000 Units by mouth daily.    . clonazePAM (KLONOPIN) 0.5 MG tablet Take 0.5-1 tablets (0.25-0.5 mg total) by mouth as directed. 1-2 times a week as needed for severe panic sx. 15 tablet 1  . clopidogrel (PLAVIX) 75 MG tablet TAKE 1 TABLET BY MOUTH ONCE DAILY. 30 tablet 6  . Cyanocobalamin (VITAMIN B-12 IJ) Inject as directed every 30 (thirty) days.     . DULoxetine (CYMBALTA) 60 MG capsule Take 1 capsule (60 mg total) by mouth daily. 90 capsule 0  . fluticasone (FLONASE) 50 MCG/ACT nasal spray Place into the nose.    . folic acid (FOLVITE) 1 MG tablet Take 1 mg by mouth 2 (two) times a day.    . gabapentin (NEURONTIN) 100 MG capsule Take 1 capsule (100 mg total) by mouth at bedtime. 30 capsule 2  . gabapentin (NEURONTIN) 800 MG tablet Take 1 tablet (800 mg total) by mouth at bedtime. 90 tablet 0  . ibuprofen (ADVIL,MOTRIN) 800 MG tablet     . lactobacillus acidophilus (BACID) TABS tablet Take 1 tablet by mouth daily.    Marland Kitchen levonorgestrel (MIRENA) 20 MCG/24HR IUD by Intrauterine route.    . Melatonin 3-10 MG TABS Take 50 mg by mouth Nightly.    . metoprolol tartrate (LOPRESSOR) 25 MG tablet Take 1 tablet (25 mg total) by mouth 2 (two) times daily. 180 tablet 3  . nitroGLYCERIN (NITROSTAT) 0.4 MG SL tablet Place 1 tablet (0.4 mg total) under the tongue every 5 (five) minutes as needed for chest pain. 25 tablet 2  . nystatin (MYCOSTATIN/NYSTOP) powder Apply topically.    . ondansetron (ZOFRAN-ODT) 8 MG disintegrating tablet Take 1 tablet by mouth 3 (three) times daily as needed for nausea.   3  . oxyCODONE (OXY IR/ROXICODONE) 5 MG immediate release tablet Take 1 tablet (5 mg total) by mouth every 6 (six) hours as needed for up to 30 days for severe pain. Must last 30 days. 120 tablet 0  . oxyCODONE (OXY IR/ROXICODONE) 5 MG immediate release tablet Take 1 tablet (5 mg total) by mouth every 6 (six) hours as needed for up to 30 days for severe  pain. Must last 30 days. 120 tablet 0  . [START  ON 10/31/2018] oxyCODONE (OXY IR/ROXICODONE) 5 MG immediate release tablet Take 1 tablet (5 mg total) by mouth every 6 (six) hours as needed for up to 30 days for severe pain. Must last 30 days. 120 tablet 0  . pantoprazole (PROTONIX) 40 MG tablet TAKE 1 TABLET BY MOUTH ONCE DAILY BEFORE BREAKFAST. 30 tablet 6  . promethazine (PHENERGAN) 25 MG tablet Take 25 mg by mouth 3 (three) times daily as needed for nausea.   2  . torsemide (DEMADEX) 20 MG tablet   2  . Vitamin D, Ergocalciferol, (DRISDOL) 1.25 MG (50000 UT) CAPS capsule 1 cap weekly 12 weeks, 12 caps     No current facility-administered medications for this visit.      Musculoskeletal: Strength & Muscle Tone: UTA Gait & Station: normal Patient leans: N/A  Psychiatric Specialty Exam: Review of Systems  Psychiatric/Behavioral: The patient is nervous/anxious.   All other systems reviewed and are negative.   There were no vitals taken for this visit.There is no height or weight on file to calculate BMI.  General Appearance: Casual  Eye Contact:  Fair  Speech:  Clear and Coherent  Volume:  Normal  Mood:  Anxious  Affect:  Appropriate  Thought Process:  Goal Directed and Descriptions of Associations: Intact  Orientation:  Full (Time, Place, and Person)  Thought Content: Logical   Suicidal Thoughts:  No  Homicidal Thoughts:  No  Memory:  Immediate;   Fair Recent;   Fair Remote;   Fair  Judgement:  Fair  Insight:  Fair  Psychomotor Activity:  Normal  Concentration:  Concentration: Fair and Attention Span: Fair  Recall:  [employer] of Knowledge: Fair  Language: Fair  Akathisia:  No  Handed:  Right  AIMS (if indicated): Denies tremors, rigidity  Assets:  Communication Skills Desire for Improvement Social Support  ADL's:  Intact  Cognition: WNL  Sleep:  Fair   Screenings: PHQ2-9     Office Visit from 04/15/2018 in [place] Clinical Support from 03/18/2018 in [place] Office Visit from 12/29/2017 in [place]  PHQ-2 Total Score  0  0  0       Assessment and Plan: Conna is a 53 year old Caucasian female, widowed, lives in [place], has a history of depression, anxiety, insomnia, hyperlipidemia, coronary artery disease, MI status post stent placement was evaluated by telemedicine today.  Patient is biologically predisposed given her family history as well as her own health issues.  Patient also has psychosocial stressors of her son's substance abuse problem: However that is getting better.  She continues to struggle with pain which is making her mood symptoms worse.  She wants to address her pain first before making medication changes.  Plan as noted below.  Plan For panic disorder- some progress Cymbalta 60 mg p.o. daily. Clonazepam 0.25-0.5 mg as needed 1-2 times a week for panic attacks.  She has been limiting use.  Patient is aware about the risk of being on benzodiazepine therapy long-term.  I have reviewed [place] controlled substance database.  For GAD- some progress Cymbalta as prescribed Discussed with her to reach out to her therapist.  For MDD-improving Cymbalta 60 mg p.o. daily Continue CBT  For insomnia-improving Melatonin as needed  Follow-up in clinic in 1 month or sooner if needed.  September 10 at 11:30 AM  I have spent atleast 15 minutes non  face to face with patient today. More than 50 % of the time was spent for psychoeducation and supportive psychotherapy and care coordination.  This note was generated in part or whole with voice recognition software. Voice recognition is usually quite accurate but there are transcription errors that can and very often do occur. I apologize for any typographical errors that were not detected and corrected.        Ursula Alert, MD 10/06/2018, 11:17 AM

## 2018-10-15 ENCOUNTER — Ambulatory Visit (HOSPITAL_COMMUNITY): Payer: Self-pay | Admitting: Hematology

## 2018-10-18 NOTE — Progress Notes (Deleted)
Subjective:    Patient ID: Sharon Lawrence, female    DOB: [birth year], 53 y.o.   MRN: [id number]  HPI Sharon Lawrence is a 53 year old female with a past medical history significant for CAD, STEMI 09/2017, anxiety, IDA, vitamin B 12 deficiency, celiac disease, colon polyps and lower GI bleed 09/2017. Past cholecystectomy.  Colonoscopy secondary to GI bleed on Plavix and ASA 09/26/2017 : Five small, non-bleeding polyps in the rectum, in the sigmoid colon and at the hepatic flexure. External hemorrhoids. - No specimens collected. Repeat colonoscopy in 1 year was recommended as patient was on Plavix and ASA in setting of recent MI.  History of elevated LFTs  Hepatic Function Latest Ref Rng & Units 12/31/2017 10/15/2017 09/26/2017  Total Protein 6.5 - 8.1 g/dL 6.3(L) 5.7(L) 4.4(L)  Albumin 3.5 - 5.0 g/dL 3.4(L) - 1.9(L)  AST 15 - 41 U/L 29 87(H) 91(H)  ALT 0 - 44 U/L 50(H) 95(H) 91(H)  Alk Phosphatase 38 - 126 U/L 97 - 181(H)  Total Bilirubin 0.3 - 1.2 mg/dL 0.6 0.3 0.4  Bilirubin, Direct 0.0 - 0.2 mg/dL - 0.1 -                                                                                                              10/15/2017: Alk Phos 222  Abdominal/pelvic CT 09/25/2017: Stable bilateral adrenal lesions.  Fatty infiltration of the liver. No acute abnormality noted.  EGD 03/30/2010: abnormal duodenum, 2cm HH  Colonoscopy 03/30/2010: small polyp transverse colon and rectosigmoid colon  Past Medical History:  Diagnosis Date  . ADHD (attention deficit hyperactivity disorder)   . Anxiety   . B12 deficiency 02/07/2011  . Back pain   . CAD (coronary artery disease)    a. s/p recent STEMI on 09/15/2017 with DES to RCA  . Celiac disease   . Folic acid deficiency 28/09/6809  . Headache   . Iron deficiency anemia 02/07/2011  . Myocardial infarction ([place]) 09/15/2017   Past Surgical History:  Procedure Laterality Date  . CHOLECYSTECTOMY    . COLONOSCOPY WITH PROPOFOL N/A 09/26/2017   Procedure:  COLONOSCOPY WITH PROPOFOL;  Surgeon: Rogene Houston, MD;  [facility];  Service: Endoscopy;  Laterality: N/A;  . CORONARY STENT INTERVENTION Right 09/15/2017   Procedure: CORONARY STENT INTERVENTION;  Surgeon: Jettie Booze, MD;  Location: [place] CV LAB;  Service: Cardiovascular;  Laterality: Right;  RCA  . KNEE CARTILAGE SURGERY Right   . LEFT HEART CATH AND CORONARY ANGIOGRAPHY N/A 09/15/2017   Procedure: LEFT HEART CATH AND CORONARY ANGIOGRAPHY;  Surgeon: Jettie Booze, MD;  Location: [place] CV LAB;  Service: Cardiovascular;  Laterality: N/A;  . TONSILLECTOMY         Review of Systems     Objective:   Physical Exam        Assessment & Plan:   71. 53 year old female with Celiac disease and IDA.  -EGD and colonoscopy  -CBC, celiac serology to assess for gluten free diet  compliance,   2. History of colon polyps identified on colonoscopy done 09/2017 while patient was in hospital for lower GI bleed, the colon polyps were not removed at that time as patient was on DAPT secndary to recent STEMI, plan was to repeat colonoscopy in 1 year -colonoscopy  3. Fatty liver per abd/pelvic CT -abdominal sonogram   4. History of elevated LFTs -Hep A, B and C serologies, AMA, SMA, ANA  5. CAD s/p STEMI - DES to RCA 09/2017

## 2018-10-19 ENCOUNTER — Ambulatory Visit (INDEPENDENT_AMBULATORY_CARE_PROVIDER_SITE_OTHER): Payer: BC Managed Care – PPO | Admitting: Nurse Practitioner

## 2018-10-27 ENCOUNTER — Inpatient Hospital Stay (HOSPITAL_COMMUNITY): Payer: BC Managed Care – PPO | Admitting: Hematology

## 2018-11-03 ENCOUNTER — Encounter: Payer: Self-pay | Admitting: Pain Medicine

## 2018-11-03 ENCOUNTER — Telehealth: Payer: Self-pay

## 2018-11-03 NOTE — Telephone Encounter (Signed)
Attempted to call patient to do pre virtual visit questions.  Left message to call us back.

## 2018-11-04 ENCOUNTER — Encounter (HOSPITAL_COMMUNITY): Payer: Self-pay | Admitting: Hematology

## 2018-11-04 ENCOUNTER — Ambulatory Visit: Payer: BC Managed Care – PPO | Attending: Pain Medicine | Admitting: Pain Medicine

## 2018-11-04 ENCOUNTER — Inpatient Hospital Stay (HOSPITAL_COMMUNITY): Payer: BC Managed Care – PPO | Attending: Hematology | Admitting: Hematology

## 2018-11-04 ENCOUNTER — Other Ambulatory Visit: Payer: Self-pay

## 2018-11-04 DIAGNOSIS — Z809 Family history of malignant neoplasm, unspecified: Secondary | ICD-10-CM | POA: Insufficient documentation

## 2018-11-04 DIAGNOSIS — I252 Old myocardial infarction: Secondary | ICD-10-CM | POA: Diagnosis not present

## 2018-11-04 DIAGNOSIS — E538 Deficiency of other specified B group vitamins: Secondary | ICD-10-CM | POA: Diagnosis not present

## 2018-11-04 DIAGNOSIS — D509 Iron deficiency anemia, unspecified: Secondary | ICD-10-CM | POA: Insufficient documentation

## 2018-11-04 DIAGNOSIS — K760 Fatty (change of) liver, not elsewhere classified: Secondary | ICD-10-CM

## 2018-11-04 DIAGNOSIS — M79604 Pain in right leg: Secondary | ICD-10-CM | POA: Diagnosis not present

## 2018-11-04 DIAGNOSIS — M62830 Muscle spasm of back: Secondary | ICD-10-CM | POA: Diagnosis not present

## 2018-11-04 DIAGNOSIS — F419 Anxiety disorder, unspecified: Secondary | ICD-10-CM | POA: Insufficient documentation

## 2018-11-04 DIAGNOSIS — Z79899 Other long term (current) drug therapy: Secondary | ICD-10-CM

## 2018-11-04 DIAGNOSIS — M5441 Lumbago with sciatica, right side: Secondary | ICD-10-CM

## 2018-11-04 DIAGNOSIS — Z793 Long term (current) use of hormonal contraceptives: Secondary | ICD-10-CM | POA: Insufficient documentation

## 2018-11-04 DIAGNOSIS — M792 Neuralgia and neuritis, unspecified: Secondary | ICD-10-CM

## 2018-11-04 DIAGNOSIS — Z791 Long term (current) use of non-steroidal anti-inflammatories (NSAID): Secondary | ICD-10-CM | POA: Diagnosis not present

## 2018-11-04 DIAGNOSIS — M7918 Myalgia, other site: Secondary | ICD-10-CM

## 2018-11-04 DIAGNOSIS — R7989 Other specified abnormal findings of blood chemistry: Secondary | ICD-10-CM | POA: Insufficient documentation

## 2018-11-04 DIAGNOSIS — Z87891 Personal history of nicotine dependence: Secondary | ICD-10-CM | POA: Diagnosis not present

## 2018-11-04 DIAGNOSIS — Z789 Other specified health status: Secondary | ICD-10-CM

## 2018-11-04 DIAGNOSIS — M899 Disorder of bone, unspecified: Secondary | ICD-10-CM

## 2018-11-04 DIAGNOSIS — G8929 Other chronic pain: Secondary | ICD-10-CM

## 2018-11-04 DIAGNOSIS — F909 Attention-deficit hyperactivity disorder, unspecified type: Secondary | ICD-10-CM | POA: Diagnosis not present

## 2018-11-04 DIAGNOSIS — G894 Chronic pain syndrome: Secondary | ICD-10-CM | POA: Diagnosis not present

## 2018-11-04 DIAGNOSIS — Z7982 Long term (current) use of aspirin: Secondary | ICD-10-CM | POA: Diagnosis not present

## 2018-11-04 MED ORDER — OXYCODONE HCL 5 MG PO TABS: 5.0000 mg | ORAL_TABLET | Freq: Four times a day (QID) | ORAL | 0 refills | Status: DC | PRN

## 2018-11-04 MED ORDER — BACLOFEN 20 MG PO TABS: 20.0000 mg | ORAL_TABLET | Freq: Every day | ORAL | 2 refills | Status: DC

## 2018-11-04 MED ORDER — PREGABALIN 50 MG PO CAPS: ORAL_CAPSULE | ORAL | 0 refills | Status: DC

## 2018-11-04 NOTE — Patient Instructions (Signed)
[facility] at [facility] Discharge Instructions     Thank you for choosing [facility] at [facility] to provide your oncology and hematology care.  To afford each patient quality time with our provider, please arrive at least 15 minutes before your scheduled appointment time.   If you have a lab appointment with the Cancer Center please come in thru the Main Entrance and check in at the main information desk.  You need to re-schedule your appointment should you arrive 10 or more minutes late.  We strive to give you quality time with our providers, and arriving late affects you and other patients whose appointments are after yours.  Also, if you no show three or more times for appointments you may be dismissed from the clinic at the providers discretion.     Again, thank you for choosing [facility].  Our hope is that these requests will decrease the amount of time that you wait before being seen by our physicians.       _____________________________________________________________  Should you have questions after your visit to [facility], please contact our office at [phone] between the hours of 8:00 a.m. and 4:30 p.m.  Voicemails left after 4:00 p.m. will not be returned until the following business day.  For prescription refill requests, have your pharmacy contact our office and allow 72 hours.    Due to Covid, you will need to wear a mask upon entering the hospital. If you do not have a mask, a mask will be given to you at the Main Entrance upon arrival. For doctor visits, patients may have 1 support person with them. For treatment visits, patients can not have anyone with them due to social distancing guidelines and our immunocompromised population.      

## 2018-11-04 NOTE — Patient Instructions (Signed)
____________________________________________________________________________________________  Medication Rules  Purpose: To inform patients, and their family members, of our rules and regulations.  Applies to: All patients receiving prescriptions (written or electronic).  Pharmacy of record: Pharmacy where electronic prescriptions will be sent. If written prescriptions are taken to a different pharmacy, please inform the nursing staff. The pharmacy listed in the electronic medical record should be the one where you would like electronic prescriptions to be sent.  Electronic prescriptions: In compliance with the [place] Strengthen Opioid Misuse Prevention (STOP) Act of 2017 (Session Law 2017-74/H243), effective March 04, 2018, all controlled substances must be electronically prescribed. Calling prescriptions to the pharmacy will cease to exist.  Prescription refills: Only during scheduled appointments. Applies to all prescriptions.  NOTE: The following applies primarily to controlled substances (Opioid* Pain Medications).   Patient's responsibilities: 1. Pain Pills: Bring all pain pills to every appointment (except for procedure appointments). 2. Pill Bottles: Bring pills in original pharmacy bottle. Always bring the newest bottle. Bring bottle, even if empty. 3. Medication refills: You are responsible for knowing and keeping track of what medications you take and those you need refilled. The day before your appointment: write a list of all prescriptions that need to be refilled. The day of the appointment: give the list to the admitting nurse. Prescriptions will be written only during appointments. No prescriptions will be written on procedure days. If you forget a medication: it will not be "Called in", "Faxed", or "electronically sent". You will need to get another appointment to get these prescribed. No early refills. Do not call asking to have your prescription filled  early. 4. Prescription Accuracy: You are responsible for carefully inspecting your prescriptions before leaving our office. Have the discharge nurse carefully go over each prescription with you, before taking them home. Make sure that your name is accurately spelled, that your address is correct. Check the name and dose of your medication to make sure it is accurate. Check the number of pills, and the written instructions to make sure they are clear and accurate. Make sure that you are given enough medication to last until your next medication refill appointment. 5. Taking Medication: Take medication as prescribed. When it comes to controlled substances, taking less pills or less frequently than prescribed is permitted and encouraged. Never take more pills than instructed. Never take medication more frequently than prescribed.  6. Inform other Doctors: Always inform, all of your healthcare providers, of all the medications you take. 7. Pain Medication from other Providers: You are not allowed to accept any additional pain medication from any other Doctor or Healthcare provider. There are two exceptions to this rule. (see below) In the event that you require additional pain medication, you are responsible for notifying us, as stated below. 8. Medication Agreement: You are responsible for carefully reading and following our Medication Agreement. This must be signed before receiving any prescriptions from our practice. Safely store a copy of your signed Agreement. Violations to the Agreement will result in no further prescriptions. (Additional copies of our Medication Agreement are available upon request.) 9. Laws, Rules, & Regulations: All patients are expected to follow all Federal and State Laws, Statutes, Rules, & Regulations. Ignorance of the Laws does not constitute a valid excuse. The use of any illegal substances is prohibited. 10. Adopted CDC guidelines & recommendations: Target dosing levels will be  at or below 60 MME/day. Use of benzodiazepines** is not recommended.  Exceptions: There are only two exceptions to the rule of not   receiving pain medications from other Healthcare Providers. 1. Exception #1 (Emergencies): In the event of an emergency (i.e.: accident requiring emergency care), you are allowed to receive additional pain medication. However, you are responsible for: As soon as you are able, call our office [phone], at any time of the day or night, and leave a message stating your name, the date and nature of the emergency, and the name and dose of the medication prescribed. In the event that your call is answered by a member of our staff, make sure to document and save the date, time, and the name of the person that took your information.  2. Exception #2 (Planned Surgery): In the event that you are scheduled by another doctor or dentist to have any type of surgery or procedure, you are allowed (for a period no longer than 30 days), to receive additional pain medication, for the acute post-op pain. However, in this case, you are responsible for picking up a copy of our "Post-op Pain Management for Surgeons" handout, and giving it to your surgeon or dentist. This document is available at our office, and does not require an appointment to obtain it. Simply go to our office during business hours (Monday-Thursday from 8:00 AM to 4:00 PM) (Friday 8:00 AM to 12:00 Noon) or if you have a scheduled appointment with us, prior to your surgery, and ask for it by name. In addition, you will need to provide us with your name, name of your surgeon, type of surgery, and date of procedure or surgery.  *Opioid medications include: morphine, codeine, oxycodone, oxymorphone, hydrocodone, hydromorphone, meperidine, tramadol, tapentadol, buprenorphine, fentanyl, methadone. **Benzodiazepine medications include: diazepam (Valium), alprazolam (Xanax), clonazepam (Klonopine), lorazepam (Ativan), clorazepate  (Tranxene), chlordiazepoxide (Librium), estazolam (Prosom), oxazepam (Serax), temazepam (Restoril), triazolam (Halcion) (Last updated: 05/01/2017) ____________________________________________________________________________________________   ____________________________________________________________________________________________  Medication Recommendations and Reminders  Applies to: All patients receiving prescriptions (written and/or electronic).  Medication Rules & Regulations: These rules and regulations exist for your safety and that of others. They are not flexible and neither are we. Dismissing or ignoring them will be considered "non-compliance" with medication therapy, resulting in complete and irreversible termination of such therapy. (See document titled "Medication Rules" for more details.) In all conscience, because of safety reasons, we cannot continue providing a therapy where the patient does not follow instructions.  Pharmacy of record:   Definition: This is the pharmacy where your electronic prescriptions will be sent.   We do not endorse any particular pharmacy.  You are not restricted in your choice of pharmacy.  The pharmacy listed in the electronic medical record should be the one where you want electronic prescriptions to be sent.  If you choose to change pharmacy, simply notify our nursing staff of your choice of new pharmacy.  Recommendations:  Keep all of your pain medications in a safe place, under lock and key, even if you live alone.   After you fill your prescription, take 1 week's worth of pills and put them away in a safe place. You should keep a separate, properly labeled bottle for this purpose. The remainder should be kept in the original bottle. Use this as your primary supply, until it runs out. Once it's gone, then you know that you have 1 week's worth of medicine, and it is time to come in for a prescription refill. If you do this correctly, it  is unlikely that you will ever run out of medicine.  To make sure that the above recommendation works,   it is very important that you make sure your medication refill appointments are scheduled at least 1 week before you run out of medicine. To do this in an effective manner, make sure that you do not leave the office without scheduling your next medication management appointment. Always ask the nursing staff to show you in your prescription , when your medication will be running out. Then arrange for the receptionist to get you a return appointment, at least 7 days before you run out of medicine. Do not wait until you have 1 or 2 pills left, to come in. This is very poor planning and does not take into consideration that we may need to cancel appointments due to bad weather, sickness, or emergencies affecting our staff.  "Partial Fill": If for any reason your pharmacy does not have enough pills/tablets to completely fill or refill your prescription, do not allow for a "partial fill". You will need a separate prescription to fill the remaining amount, which we will not provide. If the reason for the partial fill is your insurance, you will need to talk to the pharmacist about payment alternatives for the remaining tablets, but again, do not accept a partial fill.  Prescription refills and/or changes in medication(s):   Prescription refills, and/or changes in dose or medication, will be conducted only during scheduled medication management appointments. (Applies to both, written and electronic prescriptions.)  No refills on procedure days. No medication will be changed or started on procedure days. No changes, adjustments, and/or refills will be conducted on a procedure day. Doing so will interfere with the diagnostic portion of the procedure.  No phone refills. No medications will be "called into the pharmacy".  No Fax refills.  No weekend refills.  No Holliday refills.  No after hours  refills.  Remember:  Business hours are:  Monday to Thursday 8:00 AM to 4:00 PM Provider's Schedule: Starla Deller, MD - Appointments are:  Medication management: Monday and Wednesday 8:00 AM to 4:00 PM Procedure day: Tuesday and Thursday 7:30 AM to 4:00 PM Bilal Lateef, MD - Appointments are:  Medication management: Tuesday and Thursday 8:00 AM to 4:00 PM Procedure day: Monday and Wednesday 7:30 AM to 4:00 PM (Last update: 05/01/2017) ____________________________________________________________________________________________    

## 2018-11-04 NOTE — Progress Notes (Signed)
Pain Management Virtual Encounter Note - Virtual Visit via Telephone Telehealth (real-time audio visits between healthcare provider and patient).   Patient's Phone No. & Preferred Pharmacy:  [phone] (home); [phone] (mobile); (Preferred) [phone] [email or web address]  [place], [place] - [address] [place] [address] Phone: [phone] Fax: [phone]    Pre-screening note:  Our staff contacted Sharon Lawrence and offered her an "in person", "face-to-face" appointment versus a telephone encounter. She indicated preferring the telephone encounter, at this time.   Reason for Virtual Visit: COVID-19*  Social distancing based on CDC and AMA recommendations.   I contacted Sharon Lawrence on 11/04/2018 via telephone.      I clearly identified myself as Gaspar Cola, MD. I verified that I was speaking with the correct person using two identifiers (Name: Sharon Lawrence, and date of birth: Jul 26, 1965).  Advanced Informed Consent I sought verbal advanced consent from Sharon Lawrence for virtual visit interactions. I informed Sharon Lawrence of possible security and privacy concerns, risks, and limitations associated with providing "not-in-person" medical evaluation and management services. I also informed Sharon Lawrence of the availability of "in-person" appointments. Finally, I informed her that there would be a charge for the virtual visit and that she could be  personally, fully or partially, financially responsible for it. Sharon Lawrence expressed understanding and agreed to proceed.   Historic Elements   Sharon Lawrence is a 53 y.o. year old, female patient evaluated today after her last encounter by our practice on 11/03/2018. Sharon Lawrence  has a past medical history of ADHD (attention deficit hyperactivity disorder), Anxiety, B12 deficiency (02/07/2011), Back pain, CAD (coronary artery disease), Celiac disease, Folic acid deficiency (02/07/2011), Headache, Iron deficiency  anemia (02/07/2011), and Myocardial infarction ([place]) (09/15/2017). She also  has a past surgical history that includes Tonsillectomy; Cholecystectomy; Knee cartilage surgery (Right); LEFT HEART CATH AND CORONARY ANGIOGRAPHY (N/A, 09/15/2017); CORONARY STENT INTERVENTION (Right, 09/15/2017); and Colonoscopy with propofol (N/A, 09/26/2017). Sharon Lawrence has a current medication list which includes the following prescription(s): aspirin, atorvastatin, baclofen, cholecalciferol, clonazepam, clopidogrel, cyanocobalamin, duloxetine, fluticasone, folic acid, gabapentin, gabapentin, ibuprofen, lactobacillus acidophilus, levonorgestrel, melatonin, metoprolol tartrate, nitroglycerin, nystatin, ondansetron, oxycodone, oxycodone, oxycodone, pantoprazole, pregabalin, promethazine, torsemide, and vitamin d (ergocalciferol). She  reports that she quit smoking about 19 months ago. Her smoking use included cigarettes. She has a 3.00 pack-year smoking history. She has never used smokeless tobacco. She reports that she does not drink alcohol or use drugs. Sharon Lawrence is allergic to bee venom; erythromycin; cephalexin; neomycin-bacitracin-polymyxin  [bacitracin-neomycin-polymyxin]; neosporin original [bacitracin-neomycin-polymyxin]; prednisone; sulfa antibiotics; tobrex [tobramycin]; tramadol; and penicillins.   HPI  Today, she is being contacted for medication management.  The patient indicates doing well with the current medication regimen. No adverse reactions or side effects reported to the medications.  Unfortunately, the gabapentin has not provided her with good relief of the pain despite the fact that she is taking 900 mg at bedtime.  Because of this, we will go ahead and taper it down and then we will start a Lyrica trial, the dose of which we will titrate up until we get the desired effect or we encounter some side effects to the medication.  I will call this patient back in 3 weeks to assess her Lyrica trial.  She is pending an  evaluation by rheumatology in an attempt to see what is going on with this generalized burning sensation that she is experiencing everywhere.  Today I have  reviewed her labs and I have put some orders to update them.  She did have a comprehensive metabolic panel done at the [facility] which I have reviewed and appears to be within normal limits.  Pharmacotherapy Assessment  Analgesic: Oxycodone IR 5 mg every 6 hours (20 mg/day of oxycodone) MME/day:30mg /day.   Monitoring: Pharmacotherapy: No side-effects or adverse reactions reported. [place] PMP: PDMP reviewed during this encounter.       Compliance: No problems identified. Effectiveness: Clinically acceptable. Plan: Refer to "POC".  UDS:  Summary  Date Value Ref Range Status  04/15/2018 FINAL  Final    Comment:    ==================================================================== TOXASSURE SELECT 13 (MW) ==================================================================== Test                             Result       Flag       Units Drug Present and Declared for Prescription Verification   Oxycodone                      1599         EXPECTED   ng/mg creat   Oxymorphone                    1693         EXPECTED   ng/mg creat   Noroxycodone                   1441         EXPECTED   ng/mg creat   Noroxymorphone                 337          EXPECTED   ng/mg creat    Sources of oxycodone are scheduled prescription medications.    Oxymorphone, noroxycodone, and noroxymorphone are expected    metabolites of oxycodone. Oxymorphone is also available as a    scheduled prescription medication. Drug Present not Declared for Prescription Verification   7-aminoclonazepam              196          UNEXPECTED ng/mg creat    7-aminoclonazepam is an expected metabolite of clonazepam. Source    of clonazepam is a scheduled prescription medication. ==================================================================== Test                       Result    Flag   Units      Ref Range   Creatinine              112              mg/dL      >=20 ==================================================================== Declared Medications:  The flagging and interpretation on this report are based on the  following declared medications.  Unexpected results may arise from  inaccuracies in the declared medications.  **Note: The testing scope of this panel includes these medications:  Oxycodone (Oxy-IR)  **Note: The testing scope of this panel does not include following  reported medications:  Fluticasone  Gabapentin (Neurontin)  Levonorgestrel (Mirena)  Metoprolol (Lopressor)  Nitroglycerin (Nitrostat)  Nystatin (Mycostatin)  Ondansetron (Zofran)  Pantoprazole (Protonix)  Promethazine (Phenergan)  Supplement (Probiotic)  Torsemide (Demadex) ==================================================================== For clinical consultation, please call [phone]. ====================================================================    Laboratory Chemistry Profile (12 mo)  Renal: 12/31/2017: BUN 12; Creatinine, Ser 0.76  Lab Results  Component Value Date   GFRAA >60 12/31/2017   GFRNONAA >60 12/31/2017   Hepatic: 12/31/2017: Albumin 3.4 Lab Results  Component Value Date   AST 29 12/31/2017   ALT 50 (H) 12/31/2017   Other: 09/23/2018: CRP <0.8; Sed Rate 7 Note: Above Lab results reviewed.  Imaging  Last 90 days:  No results found.  Assessment  The primary encounter diagnosis was Chronic pain syndrome. Diagnoses of Chronic low back pain (Primary Area of Pain) (Bilateral) (R>L) w/ sciatica (Right), Chronic lower extremity pain (Secondary Area of Pain) (Right), Back muscle spasm, Chronic musculoskeletal pain, Neurogenic pain, Pharmacologic therapy, Disorder of skeletal system, Problems influencing health status, B12 deficiency, and Fatty liver disease, nonalcoholic were also pertinent to this visit.  Plan of Care  I have  discontinued Sharon Lawrence's oxyCODONE and oxyCODONE. I have also changed her oxyCODONE. Additionally, I am having her start on oxyCODONE, oxyCODONE, and pregabalin. Lastly, I am having her maintain her cholecalciferol, Cyanocobalamin (VITAMIN B-12 IJ), promethazine, ondansetron, aspirin, nitroGLYCERIN, pantoprazole, atorvastatin, torsemide, levonorgestrel, nystatin, fluticasone, lactobacillus acidophilus, metoprolol tartrate, clopidogrel, ibuprofen, folic acid, Melatonin, gabapentin, gabapentin, Vitamin D (Ergocalciferol), clonazePAM, DULoxetine, and baclofen.  Pharmacotherapy (Medications Ordered): Meds ordered this encounter  Medications  . oxyCODONE (OXY IR/ROXICODONE) 5 MG immediate release tablet    Sig: Take 1 tablet (5 mg total) by mouth every 6 (six) hours as needed for severe pain. Must last 30 days.    Dispense:  120 tablet    Refill:  0    Chronic Pain: STOP Act (Not applicable) Fill 1 day early if closed on refill date. Do not fill until: 11/30/2018. To last until: 12/30/2018. Avoid benzodiazepines within 8 hours of opioids  . baclofen (LIORESAL) 20 MG tablet    Sig: Take 1 tablet (20 mg total) by mouth daily.    Dispense:  30 tablet    Refill:  2    Fill one day early if pharmacy is closed on scheduled refill date. May substitute for generic if available.  Marland Kitchen oxyCODONE (OXY IR/ROXICODONE) 5 MG immediate release tablet    Sig: Take 1 tablet (5 mg total) by mouth every 6 (six) hours as needed for severe pain. Must last 30 days.    Dispense:  120 tablet    Refill:  0    Chronic Pain: STOP Act (Not applicable) Fill 1 day early if closed on refill date. Do not fill until: 12/30/2018. To last until: 01/29/2019. Avoid benzodiazepines within 8 hours of opioids  . oxyCODONE (OXY IR/ROXICODONE) 5 MG immediate release tablet    Sig: Take 1 tablet (5 mg total) by mouth every 6 (six) hours as needed for severe pain. Must last 30 days.    Dispense:  120 tablet    Refill:  0    Chronic Pain: STOP  Act (Not applicable) Fill 1 day early if closed on refill date. Do not fill until: 01/29/2019. To last until: 02/28/2019. Avoid benzodiazepines within 8 hours of opioids  . pregabalin (LYRICA) 50 MG capsule    Sig: 1 cap (50 mg) PO QD x 7 days, then increase to BID x 7 days, followed by TID. Stay at 1 cap PO TID, until re-evaluated by MD.    Dispense:  69 capsule    Refill:  0    Fill one day early if pharmacy is closed on scheduled refill date. May substitute for generic if available.   Orders:  Orders Placed This Encounter  Procedures  . Magnesium    Indication:  Pharmacologic therapy [id number])    Order Specific Question:   CC Results    Answer:   PCP-NURSE [id number]  . Vitamin B12    Indication: Pharmacologic therapy [id number]).    Order Specific Question:   CC Results    Answer:   PCP-NURSE [id number]  . Sedimentation rate    Indication: Disorder of skeletal system (M89.9)    Order Specific Question:   CC Results    Answer:   PCP-NURSE [id number]  . 25-Hydroxyvitamin D Lcms D2+D3    Indication: Disorder of skeletal system (M89.9).    Order Specific Question:   CC Results    Answer:   PCP-NURSE [id number]  . C-reactive protein    Indication: Problems influencing health status (Z78.9)    Order Specific Question:   CC Results    Answer:   PCP-NURSE [id number]   Follow-up plan:   Return in about 8 weeks (around 12/28/2018) for (VV), E/M, (MM) to evaluate Lyrica trial.      Interventional management options:  Planned, scheduled, and/or pending:    No blocks until after July 15th, 2020 due to MI and blood thinners.   Considering:   Diagnostic bilateral lumbar facet nerve block  Possible bilateral lumbar facet RFA  Diagnostic sacroiliac joint nerve block  Possible sacroiliac joint RFA    PRN Procedures:   None at this time    Recent Visits Date Type Provider Dept  08/17/18 Office Visit Milinda Pointer, MD [facility]  Showing recent visits within past 90 days and  meeting all other requirements   Today's Visits Date Type Provider Dept  11/04/18 Office Visit Milinda Pointer, MD [facility]  Showing today's visits and meeting all other requirements   Future Appointments No visits were found meeting these conditions.  Showing future appointments within next 90 days and meeting all other requirements   I discussed the assessment and treatment plan with the patient. The patient was provided an opportunity to ask questions and all were answered. The patient agreed with the plan and demonstrated an understanding of the instructions.  Patient advised to call back or seek an in-person evaluation if the symptoms or condition worsens.  Total duration of non-face-to-face encounter: 20 minutes.  Note by: Gaspar Cola, MD Date: 11/04/2018; Time: 8:43 AM  Note: This dictation was prepared with Dragon dictation. Any transcriptional errors that may result from this process are unintentional.  Disclaimer:  * Given the special circumstances of the COVID-19 pandemic, the federal government has announced that the Office for Civil Rights (OCR) will exercise its enforcement discretion and will not impose penalties on physicians using telehealth in the event of noncompliance with regulatory requirements under the [place] (HIPAA) in connection with the good faith provision of telehealth during the XX123456 national public health emergency. ([place])

## 2018-11-04 NOTE — Progress Notes (Signed)
[place] [place], [place] [address]   CLINIC:  Medical Oncology/Hematology  PCP:  Vidal Schwalbe, MD [address] [place] [address] [phone]   REASON FOR VISIT: Follow-up for Microcytic anemia, low folic acid and elevated platelet count.  CURRENT THERAPY: Parenteral iron therapy.   INTERVAL HISTORY:  Sharon Lawrence 53 y.o. female returns for routine follow-up for Microcytic anemia, low folic acid and elevated platelet count. She reports some fatigue. Otherwise she is doing well. She denies any easy bruising or bleeding.  Denies any nausea, vomiting, or diarrhea. Denies any new pains. Had not noticed any recent bleeding such as epistaxis, hematuria or hematochezia. Denies recent chest pain on exertion, shortness of breath on minimal exertion, pre-syncopal episodes, or palpitations. Denies any numbness or tingling in hands or feet. Denies any recent fevers, infections, or recent hospitalizations. Patient reports appetite at 100 % and energy level at 25 %. She is eating well and maintaining her weight at this time.     REVIEW OF SYSTEMS:  Review of Systems  Cardiovascular: Positive for leg swelling.  Neurological: Positive for numbness.  All other systems reviewed and are negative.    PAST MEDICAL/SURGICAL HISTORY:  Past Medical History:  Diagnosis Date  . ADHD (attention deficit hyperactivity disorder)   . Anxiety   . B12 deficiency 02/07/2011  . Back pain   . CAD (coronary artery disease)    a. s/p recent STEMI on 09/15/2017 with DES to RCA  . Celiac disease   . Folic acid deficiency 16/0/7371  . Headache   . Iron deficiency anemia 02/07/2011  . Myocardial infarction ([place]) 09/15/2017   Past Surgical History:  Procedure Laterality Date  . CHOLECYSTECTOMY    . COLONOSCOPY WITH PROPOFOL N/A 09/26/2017   Procedure: COLONOSCOPY WITH PROPOFOL;  Surgeon: Rogene Houston, MD;  Location: [facility];  Service: Endoscopy;  Laterality: N/A;   . CORONARY STENT INTERVENTION Right 09/15/2017   Procedure: CORONARY STENT INTERVENTION;  Surgeon: Jettie Booze, MD;  Location: [facility];  Service: Cardiovascular;  Laterality: Right;  RCA  . KNEE CARTILAGE SURGERY Right   . LEFT HEART CATH AND CORONARY ANGIOGRAPHY N/A 09/15/2017   Procedure: LEFT HEART CATH AND CORONARY ANGIOGRAPHY;  Surgeon: Jettie Booze, MD;  Location: [facility];  Service: Cardiovascular;  Laterality: N/A;  . TONSILLECTOMY       SOCIAL HISTORY:  Social History   Socioeconomic History  . Marital status: Widowed    Spouse name: Not on file  . Number of children: 3  . Years of education: Not on file  . Highest education level: Associate degree: occupational, Hotel manager, or vocational program  Occupational History  . Not on file  Social Needs  . Financial resource strain: Not hard at all  . Food insecurity    Worry: Never true    Inability: Never true  . Transportation needs    Medical: No    Non-medical: No  Tobacco Use  . Smoking status: Former Smoker    Packs/day: 0.25    Years: 12.00    Pack years: 3.00    Types: Cigarettes    Quit date: 04/04/2017    Years since quitting: 1.5  . Smokeless tobacco: Never Used  Substance and Sexual Activity  . Alcohol use: No  . Drug use: No  . Sexual activity: Not Currently  Lifestyle  . Physical activity    Days per week: 3 days  Minutes per session: 30 min  . Stress: Very much  Relationships  . Social connections    Talks on phone: More than three times a week    Gets together: More than three times a week    Attends religious service: More than 4 times per year    Active member of club or organization: No    Attends meetings of clubs or organizations: Never    Relationship status: Widowed  . Intimate partner violence    Fear of current or ex partner: No    Emotionally abused: No    Physically abused: No    Forced sexual activity: No  Other Topics Concern  . Not on file   Social History Narrative  . Not on file    FAMILY HISTORY:  Family History  Problem Relation Age of Onset  . Cancer Mother   . Hypertension Mother   . Hypertension Father   . Hypothyroidism Father   . Cancer Sister   . Anxiety disorder Sister   . Depression Sister   . CAD Maternal Grandfather   . Cirrhosis Paternal Grandfather   . CAD Paternal Grandmother   . Diabetes Paternal Grandmother   . Hypothyroidism Paternal Grandmother   . Drug abuse Son   . Bipolar disorder Son     CURRENT MEDICATIONS:  Outpatient Encounter Medications as of 11/04/2018  Medication Sig Note  . aspirin 81 MG chewable tablet Chew 1 tablet (81 mg total) by mouth daily.   Marland Kitchen atorvastatin (LIPITOR) 40 MG tablet TAKE 1 TABLET BY MOUTH ONCE DAILY AT 6PM.   . cholecalciferol (VITAMIN D) 1000 units tablet Take 5,000 Units by mouth daily.   . clopidogrel (PLAVIX) 75 MG tablet TAKE 1 TABLET BY MOUTH ONCE DAILY.   Marland Kitchen Cyanocobalamin (VITAMIN B-12 IJ) Inject as directed every 30 (thirty) days.    . DULoxetine (CYMBALTA) 60 MG capsule Take 1 capsule (60 mg total) by mouth daily.   . famotidine (PEPCID) 40 MG tablet    . folic acid (FOLVITE) 1 MG tablet Take 1 mg by mouth 2 (two) times a day.   . gabapentin (NEURONTIN) 100 MG capsule Take 1 capsule (100 mg total) by mouth at bedtime. 08/17/2018: DO NOT DELETE, even if Expired!!! See Dr. Adalberto Cole care coordination note.  . gabapentin (NEURONTIN) 800 MG tablet Take 1 tablet (800 mg total) by mouth at bedtime. 08/17/2018: DO NOT DELETE, even if Expired!!! See Dr. Adalberto Cole care coordination note.  . lactobacillus acidophilus (BACID) TABS tablet Take 1 tablet by mouth daily.   Marland Kitchen levonorgestrel (MIRENA) 20 MCG/24HR IUD by Intrauterine route.   . Melatonin 3-10 MG TABS Take 50 mg by mouth Nightly.   . metoprolol tartrate (LOPRESSOR) 25 MG tablet Take 1 tablet (25 mg total) by mouth 2 (two) times daily.   Derrill Memo ON 11/30/2018] oxyCODONE (OXY IR/ROXICODONE) 5 MG immediate  release tablet Take 1 tablet (5 mg total) by mouth every 6 (six) hours as needed for severe pain. Must last 30 days.   Derrill Memo ON 01/29/2019] oxyCODONE (OXY IR/ROXICODONE) 5 MG immediate release tablet Take 1 tablet (5 mg total) by mouth every 6 (six) hours as needed for severe pain. Must last 30 days.   . pantoprazole (PROTONIX) 40 MG tablet TAKE 1 TABLET BY MOUTH ONCE DAILY BEFORE BREAKFAST. 08/17/2018: DO NOT DELETE, even if Expired!!! See Dr. Adalberto Cole care coordination note.  . pregabalin (LYRICA) 50 MG capsule 1 cap (50 mg) PO QD x 7 days, then increase to  BID x 7 days, followed by TID. Stay at 1 cap PO TID, until re-evaluated by MD.   . torsemide (DEMADEX) 20 MG tablet    . Vitamin D, Ergocalciferol, (DRISDOL) 1.25 MG (50000 UT) CAPS capsule 1 cap weekly 12 weeks, 12 caps   . [START ON 11/30/2018] baclofen (LIORESAL) 20 MG tablet Take 1 tablet (20 mg total) by mouth daily. (Patient not taking: Reported on 11/04/2018)   . clonazePAM (KLONOPIN) 0.5 MG tablet Take 0.5-1 tablets (0.25-0.5 mg total) by mouth as directed. 1-2 times a week as needed for severe panic sx. (Patient not taking: Reported on 11/04/2018)   . fluticasone (FLONASE) 50 MCG/ACT nasal spray Place into the nose.   . ibuprofen (ADVIL,MOTRIN) 800 MG tablet    . nitroGLYCERIN (NITROSTAT) 0.4 MG SL tablet Place 1 tablet (0.4 mg total) under the tongue every 5 (five) minutes as needed for chest pain. (Patient not taking: Reported on 11/04/2018)   . nystatin (MYCOSTATIN/NYSTOP) powder Apply topically. 12/19/2017: Uses as needed.   . ondansetron (ZOFRAN-ODT) 8 MG disintegrating tablet Take 1 tablet by mouth 3 (three) times daily as needed for nausea.    . promethazine (PHENERGAN) 25 MG tablet Take 25 mg by mouth 3 (three) times daily as needed for nausea.    . [DISCONTINUED] oxyCODONE (OXY IR/ROXICODONE) 5 MG immediate release tablet Take 1 tablet (5 mg total) by mouth every 6 (six) hours as needed for severe pain. Must last 30 days.    No  facility-administered encounter medications on file as of 11/04/2018.     ALLERGIES:  Allergies  Allergen Reactions  . Bee Venom Anaphylaxis  . Erythromycin Anaphylaxis  . Cephalexin   . Neomycin-Bacitracin-Polymyxin  [Bacitracin-Neomycin-Polymyxin] Swelling    Sight of application  . Neosporin Original [Bacitracin-Neomycin-Polymyxin] Swelling    Swelling at site of application  . Prednisone   . Sulfa Antibiotics Swelling    Sulfa eye drops caused eyes to swell Sulfa eye drops caused eyes to swell  . Tobrex [Tobramycin] Swelling  . Tramadol Hives  . Penicillins Rash    Has patient had a PCN reaction causing immediate rash, facial/tongue/throat swelling, SOB or lightheadedness with hypotension: Yes Has patient had a PCN reaction causing severe rash involving mucus membranes or skin necrosis: No Has patient had a PCN reaction that required hospitalization: No Has patient had a PCN reaction occurring within the last 10 years: No If all of the above answers are "NO", then may proceed with Cephalosporin use.      PHYSICAL EXAM:  ECOG Performance status: 1  Vitals:   11/04/18 1526  BP: (!) 121/45  Pulse: 85  Resp: 18  Temp: 97.8 F (36.6 C)  SpO2: 100%   Filed Weights   11/04/18 1526  Weight: 227 lb (103 kg)    Physical Exam Vitals signs reviewed.  Constitutional:      Appearance: Normal appearance.  Cardiovascular:     Rate and Rhythm: Normal rate and regular rhythm.     Heart sounds: Normal heart sounds.  Pulmonary:     Effort: Pulmonary effort is normal.     Breath sounds: Normal breath sounds.  Abdominal:     General: There is no distension.     Palpations: Abdomen is soft. There is no mass.  Musculoskeletal:        General: No swelling.  Skin:    General: Skin is warm.  Neurological:     General: No focal deficit present.     Mental Status: She is  alert and oriented to person, place, and time.  Psychiatric:        Mood and Affect: Mood normal.         Behavior: Behavior normal.      LABORATORY DATA:  I have reviewed the labs as listed.  CBC    Component Value Date/Time   WBC 8.6 12/31/2017 1933   RBC 4.63 09/23/2018 1405   RBC 4.70 12/31/2017 1933   HGB 11.8 (L) 12/31/2017 1933   HCT 39.2 12/31/2017 1933   PLT 534 (H) 12/31/2017 1933   MCV 83.4 12/31/2017 1933   MCH 25.1 (L) 12/31/2017 1933   MCHC 30.1 12/31/2017 1933   RDW 15.8 (H) 12/31/2017 1933   LYMPHSABS 2.6 12/31/2017 1933   MONOABS 0.7 12/31/2017 1933   EOSABS 0.0 12/31/2017 1933   BASOSABS 0.0 12/31/2017 1933   CMP Latest Ref Rng & Units 12/31/2017 10/15/2017 09/27/2017  Glucose 70 - 99 mg/dL 130(H) - 99  BUN 6 - 20 mg/dL 12 - 9  Creatinine 0.44 - 1.00 mg/dL 0.76 - 0.61  Sodium 135 - 145 mmol/L 142 - 140  Potassium 3.5 - 5.1 mmol/L 3.5 - 3.5  Chloride 98 - 111 mmol/L 106 - 112(H)  CO2 22 - 32 mmol/L 29 - 22  Calcium 8.9 - 10.3 mg/dL 8.4(L) - 7.7(L)  Total Protein 6.5 - 8.1 g/dL 6.3(L) 5.7(L) -  Total Bilirubin 0.3 - 1.2 mg/dL 0.6 0.3 -  Alkaline Phos 38 - 126 U/L 97 - -  AST 15 - 41 U/L 29 87(H) -  ALT 0 - 44 U/L 50(H) 95(H) -       DIAGNOSTIC IMAGING:  I have independently reviewed the scans and discussed with the patient.   I have reviewed Sharon Finders, NP's note and agree with the documentation.  I personally performed a face-to-face visit, made revisions and my assessment and plan is as follows.    ASSESSMENT & PLAN:   IDA (iron deficiency anemia) 1.  Microcytic anemia: -Labs on 08/06/2018 from Dr. Arliss Journey office shows hemoglobin 11.4 with MCV of 78. - Colonoscopy by Dr. Laural Golden on 09/26/2017 shows normal ileum, 5 nonbleeding polyps in the rectum, in the sigmoid colon, hepatic flexure Hemorrhoids. -EGD on 03/29/2010 showed abnormal duodenal mucosa. -Patient had a Feraheme infusion in 2012 and 14.  Oral iron therapy resulted in severe constipation, nausea and intolerance. - We reviewed her blood work.  Ferritin was low at 9.  Percent saturation  was 9.  Serum copper, CRP, methylmalonic acid, SPEP, ESR were normal.  Reticulocyte count was also normal. - I have recommended Feraheme infusions weekly x2.  We discussed about side effects in detail including anaphylactic reactions.  We will schedule it next week. -We will reevaluate her in 2 months with repeat labs.  2.  Folate/B12 deficiency: - She is taking folic acid 1 mg twice daily for many years.  She takes B12 once a month. - Methylmalonic acid was normal.  3.  Thrombocytosis: -Platelet count was elevated at 525.  This could be reactive from underlying iron deficiency. -CTAP on 09/25/2017 shows normal-sized spleen without focal abnormality.  Fatty infiltration of the liver noted.  Stable bilateral adrenal adenomas. - If she continues to have elevated platelet count after iron repletion, will consider mutation testing for myeloproliferative disorder. -Her mother has Jak 2+ myeloproliferative neoplasm.      Orders placed this encounter:  No orders of the defined types were placed in this encounter.     Derek Jack, MD  [facility] [phone]

## 2018-11-04 NOTE — Assessment & Plan Note (Addendum)
1.  Microcytic anemia: -Labs on 08/06/2018 from Dr. Arliss Journey office shows hemoglobin 11.4 with MCV of 78. - Colonoscopy by Dr. Laural Golden on 09/26/2017 shows normal ileum, 5 nonbleeding polyps in the rectum, in the sigmoid colon, hepatic flexure Hemorrhoids. -EGD on 03/29/2010 showed abnormal duodenal mucosa. -Patient had a Feraheme infusion in 2012 and 14.  Oral iron therapy resulted in severe constipation, nausea and intolerance. - We reviewed her blood work.  Ferritin was low at 9.  Percent saturation was 9.  Serum copper, CRP, methylmalonic acid, SPEP, ESR were normal.  Reticulocyte count was also normal. - I have recommended Feraheme infusions weekly x2.  We discussed about side effects in detail including anaphylactic reactions.  We will schedule it next week. -We will reevaluate her in 2 months with repeat labs.  2.  Folate/B12 deficiency: - She is taking folic acid 1 mg twice daily for many years.  She takes B12 once a month. - Methylmalonic acid was normal.  3.  Thrombocytosis: -Platelet count was elevated at 525.  This could be reactive from underlying iron deficiency. -CTAP on 09/25/2017 shows normal-sized spleen without focal abnormality.  Fatty infiltration of the liver noted.  Stable bilateral adrenal adenomas. - If she continues to have elevated platelet count after iron repletion, will consider mutation testing for myeloproliferative disorder. -Her mother has Jak 2+ myeloproliferative neoplasm.

## 2018-11-11 ENCOUNTER — Ambulatory Visit (HOSPITAL_COMMUNITY): Payer: BC Managed Care – PPO

## 2018-11-12 ENCOUNTER — Other Ambulatory Visit: Payer: Self-pay

## 2018-11-12 ENCOUNTER — Ambulatory Visit (INDEPENDENT_AMBULATORY_CARE_PROVIDER_SITE_OTHER): Payer: Self-pay | Admitting: Psychiatry

## 2018-11-12 ENCOUNTER — Encounter: Payer: Self-pay | Admitting: Psychiatry

## 2018-11-12 DIAGNOSIS — F5105 Insomnia due to other mental disorder: Secondary | ICD-10-CM

## 2018-11-12 DIAGNOSIS — F410 Panic disorder [episodic paroxysmal anxiety] without agoraphobia: Secondary | ICD-10-CM

## 2018-11-12 DIAGNOSIS — F330 Major depressive disorder, recurrent, mild: Secondary | ICD-10-CM

## 2018-11-12 DIAGNOSIS — F411 Generalized anxiety disorder: Secondary | ICD-10-CM

## 2018-11-12 NOTE — Progress Notes (Signed)
Virtual Visit via Video Note  I connected with Sharon Lawrence on 11/12/18 at 11:30 AM EDT by a video enabled telemedicine application and verified that I am speaking with the correct person using two identifiers.   I discussed the limitations of evaluation and management by telemedicine and the availability of in person appointments. The patient expressed understanding and agreed to proceed.   I discussed the assessment and treatment plan with the patient. The patient was provided an opportunity to ask questions and all were answered. The patient agreed with the plan and demonstrated an understanding of the instructions.   The patient was advised to call back or seek an in-person evaluation if the symptoms worsen or if the condition fails to improve as anticipated.  Provider Location : [facility] Patient Location : Work  [place] MD OP Progress Note  11/12/2018 11:35 AM Sharon Lawrence  MRN:  [id number]  Chief Complaint:  Chief Complaint    Follow-up     HPI: Sharon Lawrence is a 53 year old Caucasian female, widowed, employed as a Marine scientist, lives in [place], has a history of coronary artery disease status post STEMI on 09/15/2017, status post stent placement, hyperlipidemia, chronic anemia was evaluated by telemedicine today.  Patient today reports she has been struggling with some joint pain recently.  Her providers are currently trying to manage it and she has to get further testing.  She was recently taken off of the gabapentin and started on the Lyrica.  She reports it has not helped much yet.  She however wants to give it more time.  She reports because of the pain she feels more depressed.  There are days when she struggles to stay active.  She however has been working since she needs to.  She reports work is going well.  Patient reports sleep is good.  However the pain does make it restless on and off.  She is currently not interested in sleep medications.  She is currently on the Cymbalta.  She reports she  wants to stay on the same dosage for now.  She will continue to work with her therapist.  She does have a history of panic attacks.  She reports she does take clonazepam as needed when she has severe panic symptoms.  Patient denies any suicidality, homicidality or perceptual disturbances.  Patient denies any other concerns today. Visit Diagnosis:    ICD-10-CM   1. Panic disorder  F41.0   2. GAD (generalized anxiety disorder)  F41.1   3. MDD (major depressive disorder), recurrent episode, mild ([place])  F33.0   4. Insomnia due to mental disorder  F51.05     Past Psychiatric History: I have reviewed past psychiatric history from my progress note on 11/17/2017.  Past trials of Zoloft, Paxil, trazodone  Past Medical History:  Past Medical History:  Diagnosis Date  . ADHD (attention deficit hyperactivity disorder)   . Anxiety   . B12 deficiency 02/07/2011  . Back pain   . CAD (coronary artery disease)    a. s/p recent STEMI on 09/15/2017 with DES to RCA  . Celiac disease   . Folic acid deficiency 37/03/624  . Headache   . Iron deficiency anemia 02/07/2011  . Myocardial infarction ([place]) 09/15/2017    Past Surgical History:  Procedure Laterality Date  . CHOLECYSTECTOMY    . COLONOSCOPY WITH PROPOFOL N/A 09/26/2017   Procedure: COLONOSCOPY WITH PROPOFOL;  Surgeon: Rogene Houston, MD;  Location: [facility];  Service: Endoscopy;  Laterality: N/A;  .  CORONARY STENT INTERVENTION Right 09/15/2017   Procedure: CORONARY STENT INTERVENTION;  Surgeon: Jettie Booze, MD;  Location: [facility];  Service: Cardiovascular;  Laterality: Right;  RCA  . KNEE CARTILAGE SURGERY Right   . LEFT HEART CATH AND CORONARY ANGIOGRAPHY N/A 09/15/2017   Procedure: LEFT HEART CATH AND CORONARY ANGIOGRAPHY;  Surgeon: Jettie Booze, MD;  Location: [facility];  Service: Cardiovascular;  Laterality: N/A;  . TONSILLECTOMY      Family Psychiatric History: I have reviewed family psychiatric  history from my progress note on 11/17/2017  Family History:  Family History  Problem Relation Age of Onset  . Cancer Mother   . Hypertension Mother   . Hypertension Father   . Hypothyroidism Father   . Cancer Sister   . Anxiety disorder Sister   . Depression Sister   . CAD Maternal Grandfather   . Cirrhosis Paternal Grandfather   . CAD Paternal Grandmother   . Diabetes Paternal Grandmother   . Hypothyroidism Paternal Grandmother   . Drug abuse Son   . Bipolar disorder Son     Social History: I have reviewed social history from my progress note on 11/17/2017 Social History   Socioeconomic History  . Marital status: Widowed    Spouse name: Not on file  . Number of children: 3  . Years of education: Not on file  . Highest education level: Associate degree: occupational, Hotel manager, or vocational program  Occupational History  . Not on file  Social Needs  . Financial resource strain: Not hard at all  . Food insecurity    Worry: Never true    Inability: Never true  . Transportation needs    Medical: No    Non-medical: No  Tobacco Use  . Smoking status: Former Smoker    Packs/day: 0.25    Years: 12.00    Pack years: 3.00    Types: Cigarettes    Quit date: 04/04/2017    Years since quitting: 1.6  . Smokeless tobacco: Never Used  Substance and Sexual Activity  . Alcohol use: No  . Drug use: No  . Sexual activity: Not Currently  Lifestyle  . Physical activity    Days per week: 3 days    Minutes per session: 30 min  . Stress: Very much  Relationships  . Social connections    Talks on phone: More than three times a week    Gets together: More than three times a week    Attends religious service: More than 4 times per year    Active member of club or organization: No    Attends meetings of clubs or organizations: Never    Relationship status: Widowed  Other Topics Concern  . Not on file  Social History Narrative  . Not on file    Allergies:  Allergies   Allergen Reactions  . Bee Venom Anaphylaxis  . Erythromycin Anaphylaxis  . Cephalexin   . Neomycin-Bacitracin-Polymyxin  [Bacitracin-Neomycin-Polymyxin] Swelling    Sight of application  . Neosporin Original [Bacitracin-Neomycin-Polymyxin] Swelling    Swelling at site of application  . Prednisone   . Sulfa Antibiotics Swelling    Sulfa eye drops caused eyes to swell Sulfa eye drops caused eyes to swell  . Tobrex [Tobramycin] Swelling  . Tramadol Hives  . Penicillins Rash    Has patient had a PCN reaction causing immediate rash, facial/tongue/throat swelling, SOB or lightheadedness with hypotension: Yes Has patient had a PCN reaction causing severe rash involving  mucus membranes or skin necrosis: No Has patient had a PCN reaction that required hospitalization: No Has patient had a PCN reaction occurring within the last 10 years: No If all of the above answers are "NO", then may proceed with Cephalosporin use.     Metabolic Disorder Labs: Lab Results  Component Value Date   HGBA1C 5.6 09/16/2017   MPG 114 09/16/2017   MPG 123 (H) 07/07/2013   No results found for: PROLACTIN Lab Results  Component Value Date   CHOL 77 09/16/2017   TRIG 64 09/16/2017   HDL 19 (L) 09/16/2017   CHOLHDL 4.1 09/16/2017   VLDL 13 09/16/2017   LDLCALC 45 09/16/2017   LDLCALC 97 07/07/2013   Lab Results  Component Value Date   TSH 2.471 07/07/2013   TSH 1.745 06/03/2012    Therapeutic Level Labs: No results found for: LITHIUM No results found for: VALPROATE No components found for:  CBMZ  Current Medications: Current Outpatient Medications  Medication Sig Dispense Refill  . aspirin 81 MG chewable tablet Chew 1 tablet (81 mg total) by mouth daily.    Marland Kitchen atorvastatin (LIPITOR) 40 MG tablet TAKE 1 TABLET BY MOUTH ONCE DAILY AT 6PM. 30 tablet 6  . [START ON 11/30/2018] baclofen (LIORESAL) 20 MG tablet Take 1 tablet (20 mg total) by mouth daily. (Patient not taking: Reported on 11/04/2018) 30  tablet 2  . cholecalciferol (VITAMIN D) 1000 units tablet Take 5,000 Units by mouth daily.    . clonazePAM (KLONOPIN) 0.5 MG tablet Take 0.5-1 tablets (0.25-0.5 mg total) by mouth as directed. 1-2 times a week as needed for severe panic sx. (Patient not taking: Reported on 11/04/2018) 15 tablet 1  . clopidogrel (PLAVIX) 75 MG tablet TAKE 1 TABLET BY MOUTH ONCE DAILY. 30 tablet 6  . Cyanocobalamin (VITAMIN B-12 IJ) Inject as directed every 30 (thirty) days.     . DULoxetine (CYMBALTA) 60 MG capsule Take 1 capsule (60 mg total) by mouth daily. 90 capsule 0  . famotidine (PEPCID) 40 MG tablet     . fluticasone (FLONASE) 50 MCG/ACT nasal spray Place into the nose.    . folic acid (FOLVITE) 1 MG tablet Take 1 mg by mouth 2 (two) times a day.    . ibuprofen (ADVIL,MOTRIN) 800 MG tablet     . lactobacillus acidophilus (BACID) TABS tablet Take 1 tablet by mouth daily.    Marland Kitchen levonorgestrel (MIRENA) 20 MCG/24HR IUD by Intrauterine route.    . Melatonin 3-10 MG TABS Take 50 mg by mouth Nightly.    . metoprolol tartrate (LOPRESSOR) 25 MG tablet Take 1 tablet (25 mg total) by mouth 2 (two) times daily. 180 tablet 3  . nitroGLYCERIN (NITROSTAT) 0.4 MG SL tablet Place 1 tablet (0.4 mg total) under the tongue every 5 (five) minutes as needed for chest pain. (Patient not taking: Reported on 11/04/2018) 25 tablet 2  . nystatin (MYCOSTATIN/NYSTOP) powder Apply topically.    . ondansetron (ZOFRAN-ODT) 8 MG disintegrating tablet Take 1 tablet by mouth 3 (three) times daily as needed for nausea.   3  . [START ON 11/30/2018] oxyCODONE (OXY IR/ROXICODONE) 5 MG immediate release tablet Take 1 tablet (5 mg total) by mouth every 6 (six) hours as needed for severe pain. Must last 30 days. 120 tablet 0  . [START ON 01/29/2019] oxyCODONE (OXY IR/ROXICODONE) 5 MG immediate release tablet Take 1 tablet (5 mg total) by mouth every 6 (six) hours as needed for severe pain. Must last 30 days. [place]  tablet 0  . pantoprazole (PROTONIX) 40 MG  tablet TAKE 1 TABLET BY MOUTH ONCE DAILY BEFORE BREAKFAST. 30 tablet 6  . pregabalin (LYRICA) 50 MG capsule 1 cap (50 mg) PO QD x 7 days, then increase to BID x 7 days, followed by TID. Stay at 1 cap PO TID, until re-evaluated by MD. 69 capsule 0  . promethazine (PHENERGAN) 25 MG tablet Take 25 mg by mouth 3 (three) times daily as needed for nausea.   2  . torsemide (DEMADEX) 20 MG tablet   2  . Vitamin D, Ergocalciferol, (DRISDOL) 1.25 MG (50000 UT) CAPS capsule 1 cap weekly 12 weeks, 12 caps     No current facility-administered medications for this visit.      Musculoskeletal: Strength & Muscle Tone: UTA Gait & Station: normal Patient leans: N/A  Psychiatric Specialty Exam: Review of Systems  Musculoskeletal: Positive for joint pain.  Psychiatric/Behavioral: Positive for depression.  All other systems reviewed and are negative.   There were no vitals taken for this visit.There is no height or weight on file to calculate BMI.  General Appearance: Casual  Eye Contact:  Fair  Speech:  Clear and Coherent  Volume:  Normal  Mood:  Depressed  Affect:  Congruent  Thought Process:  Goal Directed and Descriptions of Associations: Intact  Orientation:  Full (Time, Place, and Person)  Thought Content: Logical   Suicidal Thoughts:  No  Homicidal Thoughts:  No  Memory:  Immediate;   Fair Recent;   Fair Remote;   Fair  Judgement:  Fair  Insight:  Fair  Psychomotor Activity:  Normal  Concentration:  Concentration: Fair and Attention Span: Fair  Recall:  [employer] of Knowledge: Fair  Language: Fair  Akathisia:  No  Handed:  Right  AIMS (if indicated): denies tremors, rigidity  Assets:  Communication Skills Desire for Improvement Social Support  ADL's:  Intact  Cognition: WNL  Sleep:  Restless   Screenings: PHQ2-9     Office Visit from 04/15/2018 in [facility] Clinical Support from 03/18/2018 in [facility] Office Visit from 12/29/2017 in [facility]  PHQ-2 Total Score  0  0  0       Assessment and Plan: Asmara is a 53 year old Caucasian female, widowed, lives in [place], has a history of depression, anxiety, insomnia, hyperlipidemia, coronary artery disease, MI status post stent placement was evaluated by telemedicine today.  Patient is biologically predisposed given her family history as well as her own health issues.  She also has psychosocial stressors of her son's substance abuse problem which is however getting better at this time.  She reports she is also struggling with pain which is contributing to her mood.  She however is not interested in medication changes today and will continue to work with her pain providers.  She will also continue psychotherapy sessions.  Plan as noted below.  Plan Panic disorder- improving Cymbalta 60 mg p.o. daily Clonazepam 0.25 to 0.5 mg as needed 1-2 times a week for panic attacks.  She has been limiting use.  She is aware about the risk of being on long-term benzodiazepine therapy.  I have reviewed [place] controlled substance database.  For GAD-improving Cymbalta as prescribed She will continue to work with therapist  MDD-worsening due to pain Cymbalta 60 mg p.o. daily She reports she wants to manage her pain and does not want medication readjustment today.  Continue CBT  Insomnia-restless Melatonin as needed  Follow-up in clinic in 6 to 8 weeks or sooner if needed.  October 27, 2 pm  I have spent atleast 15 minutes non face to face with patient today. More than 50 % of the time was spent for psychoeducation and supportive psychotherapy and care coordination. This note was generated in part or whole with voice recognition software. Voice recognition is usually quite accurate but there are transcription errors that can and very often do occur. I apologize for any typographical errors that were  not detected and corrected.         Ursula Alert, MD 11/12/2018, 11:35 AM

## 2018-11-13 ENCOUNTER — Ambulatory Visit (HOSPITAL_COMMUNITY): Payer: BC Managed Care – PPO | Admitting: Hematology

## 2018-11-16 DIAGNOSIS — Z111 Encounter for screening for respiratory tuberculosis: Secondary | ICD-10-CM | POA: Diagnosis not present

## 2018-11-18 ENCOUNTER — Inpatient Hospital Stay (HOSPITAL_COMMUNITY): Payer: BC Managed Care – PPO

## 2018-11-18 ENCOUNTER — Ambulatory Visit: Payer: Self-pay | Admitting: Pain Medicine

## 2018-11-30 DIAGNOSIS — R05 Cough: Secondary | ICD-10-CM | POA: Diagnosis not present

## 2018-11-30 DIAGNOSIS — J0140 Acute pansinusitis, unspecified: Secondary | ICD-10-CM | POA: Diagnosis not present

## 2018-11-30 DIAGNOSIS — Z1159 Encounter for screening for other viral diseases: Secondary | ICD-10-CM | POA: Diagnosis not present

## 2018-12-07 DIAGNOSIS — J189 Pneumonia, unspecified organism: Secondary | ICD-10-CM | POA: Diagnosis not present

## 2018-12-24 ENCOUNTER — Telehealth: Payer: Self-pay

## 2018-12-24 NOTE — Telephone Encounter (Signed)
No answer when calling to verify Mondays appt. Message left to return call.

## 2018-12-27 NOTE — Progress Notes (Signed)
Unsuccessful attempt to contact patient for Virtual Visit (Pain Management Telehealth)   Patient provided contact information:  [phone] (home); [phone] (mobile); (Preferred) [phone] [email or web address]   Pre-screening:  Our staff was unsuccessful in contacting Ms. Carbone using the above provided information.   I unsuccessfully attempted to make contact with Laquashia K Fugere on 12/28/2018 via telephone. I was unable to complete the virtual encounter due to call going directly to voicemail. I was able to leave a message where I clearly identify myself as Gaspar Cola, MD and I left a message to call us back to reschedule the call.  Pharmacotherapy Assessment  Analgesic: Oxycodone IR 5 mg every 6 hours (20 mg/day of oxycodone) MME/day:30mg /day.   Monitoring: Pharmacotherapy: No side-effects or adverse reactions reported. [place] PMP: PDMP reviewed during this encounter.        Follow-up plan:   No follow-ups on file.      Interventional management options:  Planned, scheduled, and/or pending:    No blocks until after July 15th, 2020 due to MI and blood thinners.   Considering:   Diagnostic bilateral lumbar facet nerve block  Possible bilateral lumbar facet RFA  Diagnostic sacroiliac joint nerve block  Possible sacroiliac joint RFA    PRN Procedures:   None at this time    Recent Visits Date Type Provider Dept  11/04/18 Office Visit Milinda Pointer, MD [facility]  Showing recent visits within past 90 days and meeting all other requirements   Today's Visits Date Type Provider Dept  12/28/18 Appointment Milinda Pointer, MD [facility]  Showing today's visits and meeting all other requirements   Future Appointments No visits were found meeting these conditions.  Showing future appointments within next 90 days and meeting all other requirements    Note by: Gaspar Cola, MD Date: 12/28/2018; Time: 11:57 AM

## 2018-12-28 ENCOUNTER — Ambulatory Visit (HOSPITAL_BASED_OUTPATIENT_CLINIC_OR_DEPARTMENT_OTHER): Payer: BC Managed Care – PPO | Admitting: Pain Medicine

## 2018-12-28 ENCOUNTER — Other Ambulatory Visit: Payer: Self-pay

## 2018-12-28 DIAGNOSIS — M79604 Pain in right leg: Secondary | ICD-10-CM

## 2018-12-28 DIAGNOSIS — G8929 Other chronic pain: Secondary | ICD-10-CM

## 2018-12-28 DIAGNOSIS — M5441 Lumbago with sciatica, right side: Secondary | ICD-10-CM

## 2018-12-28 DIAGNOSIS — M62830 Muscle spasm of back: Secondary | ICD-10-CM

## 2018-12-28 DIAGNOSIS — M7918 Myalgia, other site: Secondary | ICD-10-CM

## 2018-12-28 DIAGNOSIS — G894 Chronic pain syndrome: Secondary | ICD-10-CM

## 2018-12-28 DIAGNOSIS — M792 Neuralgia and neuritis, unspecified: Secondary | ICD-10-CM

## 2018-12-28 NOTE — Progress Notes (Signed)
Patient was started on Lyrica and ran out but feels like it worked a lot better than the Gabapentin

## 2018-12-29 ENCOUNTER — Other Ambulatory Visit: Payer: Self-pay

## 2018-12-29 ENCOUNTER — Ambulatory Visit: Payer: BC Managed Care – PPO | Attending: Pain Medicine | Admitting: Pain Medicine

## 2018-12-29 ENCOUNTER — Ambulatory Visit (INDEPENDENT_AMBULATORY_CARE_PROVIDER_SITE_OTHER): Payer: Self-pay | Admitting: Psychiatry

## 2018-12-29 DIAGNOSIS — M5441 Lumbago with sciatica, right side: Secondary | ICD-10-CM

## 2018-12-29 DIAGNOSIS — F410 Panic disorder [episodic paroxysmal anxiety] without agoraphobia: Secondary | ICD-10-CM

## 2018-12-29 DIAGNOSIS — M7918 Myalgia, other site: Secondary | ICD-10-CM

## 2018-12-29 DIAGNOSIS — F330 Major depressive disorder, recurrent, mild: Secondary | ICD-10-CM

## 2018-12-29 DIAGNOSIS — M792 Neuralgia and neuritis, unspecified: Secondary | ICD-10-CM

## 2018-12-29 DIAGNOSIS — F5105 Insomnia due to other mental disorder: Secondary | ICD-10-CM

## 2018-12-29 DIAGNOSIS — F411 Generalized anxiety disorder: Secondary | ICD-10-CM

## 2018-12-29 DIAGNOSIS — M62830 Muscle spasm of back: Secondary | ICD-10-CM | POA: Diagnosis not present

## 2018-12-29 DIAGNOSIS — G894 Chronic pain syndrome: Secondary | ICD-10-CM

## 2018-12-29 DIAGNOSIS — G8929 Other chronic pain: Secondary | ICD-10-CM

## 2018-12-29 DIAGNOSIS — M79604 Pain in right leg: Secondary | ICD-10-CM | POA: Diagnosis not present

## 2018-12-29 MED ORDER — OXYCODONE HCL 5 MG PO TABS: 5.0000 mg | ORAL_TABLET | Freq: Four times a day (QID) | ORAL | 0 refills | Status: DC | PRN

## 2018-12-29 MED ORDER — PREGABALIN 75 MG PO CAPS: 75.0000 mg | ORAL_CAPSULE | Freq: Three times a day (TID) | ORAL | 3 refills | Status: DC

## 2018-12-29 MED ORDER — BACLOFEN 20 MG PO TABS: 20.0000 mg | ORAL_TABLET | Freq: Every day | ORAL | 5 refills | Status: DC

## 2018-12-29 NOTE — Progress Notes (Signed)
No response to call

## 2018-12-29 NOTE — Progress Notes (Signed)
Pain Management Virtual Encounter Note - Virtual Visit via Telephone Telehealth (real-time audio visits between healthcare provider and patient).   Patient's Phone No. & Preferred Pharmacy:  [phone] (home); [phone] (mobile); (Preferred) [phone] [email or web address]  [place], [place] - [address] [place] [address] Phone: [phone] Fax: [phone]    Pre-screening note:  Our staff contacted Sharon Lawrence and offered her an "in person", "face-to-face" appointment versus a telephone encounter. She indicated preferring the telephone encounter, at this time.   Reason for Virtual Visit: COVID-19*  Social distancing based on CDC and AMA recommendations.   I contacted Sharon Lawrence on 12/29/2018 via telephone.      I clearly identified myself as Gaspar Cola, MD. I verified that I was speaking with the correct person using two identifiers (Name: Sharon Lawrence, and date of birth: [birth year]).  Advanced Informed Consent I sought verbal advanced consent from Sharon Lawrence for virtual visit interactions. I informed Sharon Lawrence of possible security and privacy concerns, risks, and limitations associated with providing "not-in-person" medical evaluation and management services. I also informed Sharon Lawrence of the availability of "in-person" appointments. Finally, I informed her that there would be a charge for the virtual visit and that she could be  personally, fully or partially, financially responsible for it. Sharon Lawrence expressed understanding and agreed to proceed.   Historic Elements   Sharon Lawrence is a 53 y.o. year old, female patient evaluated today after her last encounter by our practice on 12/24/2018. Sharon Lawrence  has a past medical history of ADHD (attention deficit hyperactivity disorder), Anxiety, B12 deficiency (02/07/2011), Back pain, CAD (coronary artery disease), Celiac disease, Folic acid deficiency (02/07/2011), Headache, Iron  deficiency anemia (02/07/2011), and Myocardial infarction ([place]) (09/15/2017). She also  has a past surgical history that includes Tonsillectomy; Cholecystectomy; Knee cartilage surgery (Right); LEFT HEART CATH AND CORONARY ANGIOGRAPHY (N/A, 09/15/2017); CORONARY STENT INTERVENTION (Right, 09/15/2017); and Colonoscopy with propofol (N/A, 09/26/2017). Sharon Lawrence has a current medication list which includes the following prescription(s): aspirin, atorvastatin, cholecalciferol, clonazepam, clopidogrel, cyanocobalamin, duloxetine, famotidine, fluticasone, folic acid, ibuprofen, lactobacillus acidophilus, levonorgestrel, melatonin, nitroglycerin, nystatin, ondansetron, oxycodone, oxycodone, oxycodone, pantoprazole, promethazine, torsemide, vitamin d (ergocalciferol), baclofen, baclofen, metoprolol tartrate, pregabalin, and pregabalin. She  reports that she quit smoking about 20 months ago. Her smoking use included cigarettes. She has a 3.00 pack-year smoking history. She has never used smokeless tobacco. She reports that she does not drink alcohol or use drugs. Sharon Lawrence is allergic to bee venom; erythromycin; cephalexin; neomycin-bacitracin-polymyxin  [bacitracin-neomycin-polymyxin]; neosporin original [bacitracin-neomycin-polymyxin]; prednisone; sulfa antibiotics; tobrex [tobramycin]; tramadol; and penicillins.   HPI  Today, she is being contacted for medication management.  Today's encounter is to evaluate the patient's Lyrica trial.  The patient indicates doing well with the current medication regimen. No adverse reactions or side effects reported to the medications.  According to the patient she did extremely well with the Lyrica, much better than the gabapentin.  She was able to tolerate the 50 mg 3 times daily.  She admits that at the beginning it made her a little sleepy, but she had adapted to it well.  She wants to continue on it and in fact she is interested in increasing the dose.  Today we will go from the 50 mg  to 75 mg and she has been instructed to again start taking it at bedtime, followed by twice daily, and eventually when she gets adjusted to the new  dose, go to 3 times daily.  She is doing well on all of the other medicines and therefore today I will give her refills to last until 04/29/2019, at which time I will call her back for follow-up.  If at that time she requires an increase in the Lyrica dose, we will consider doing that.  Pharmacotherapy Assessment  Analgesic: Oxycodone IR 5 mg every 6 hours (20 mg/day of oxycodone) MME/day:30mg /day.   Monitoring: Pharmacotherapy: No side-effects or adverse reactions reported. [place] PMP: PDMP reviewed during this encounter.       Compliance: No problems identified. Effectiveness: Clinically acceptable. Plan: Refer to "POC".  UDS:  Summary  Date Value Ref Range Status  04/15/2018 FINAL  Final    Comment:    ==================================================================== TOXASSURE SELECT 13 (MW) ==================================================================== Test                             Result       Flag       Units Drug Present and Declared for Prescription Verification   Oxycodone                      1599         EXPECTED   ng/mg creat   Oxymorphone                    1693         EXPECTED   ng/mg creat   Noroxycodone                   1441         EXPECTED   ng/mg creat   Noroxymorphone                 337          EXPECTED   ng/mg creat    Sources of oxycodone are scheduled prescription medications.    Oxymorphone, noroxycodone, and noroxymorphone are expected    metabolites of oxycodone. Oxymorphone is also available as a    scheduled prescription medication. Drug Present not Declared for Prescription Verification   7-aminoclonazepam              196          UNEXPECTED ng/mg creat    7-aminoclonazepam is an expected metabolite of clonazepam. Source    of clonazepam is a scheduled prescription  medication. ==================================================================== Test                      Result    Flag   Units      Ref Range   Creatinine              112              mg/dL      >=20 ==================================================================== Declared Medications:  The flagging and interpretation on this report are based on the  following declared medications.  Unexpected results may arise from  inaccuracies in the declared medications.  **Note: The testing scope of this panel includes these medications:  Oxycodone (Oxy-IR)  **Note: The testing scope of this panel does not include following  reported medications:  Fluticasone  Gabapentin (Neurontin)  Levonorgestrel (Mirena)  Metoprolol (Lopressor)  Nitroglycerin (Nitrostat)  Nystatin (Mycostatin)  Ondansetron (Zofran)  Pantoprazole (Protonix)  Promethazine (Phenergan)  Supplement (Probiotic)  Torsemide (Demadex) ==================================================================== For clinical consultation, please call [phone]. ====================================================================  Laboratory Chemistry Profile (12 mo)  Renal: 12/31/2017: BUN 12; Creatinine, Ser 0.76  Lab Results  Component Value Date   GFRAA >60 12/31/2017   GFRNONAA >60 12/31/2017   Hepatic: 12/31/2017: Albumin 3.4 Lab Results  Component Value Date   AST 29 12/31/2017   ALT 50 (H) 12/31/2017   Other: 09/23/2018: CRP <0.8; Sed Rate 7 Note: Above Lab results reviewed.  Imaging  Last 90 days:  No results found.  Assessment  The primary encounter diagnosis was Chronic pain syndrome. Diagnoses of Chronic low back pain (Primary Area of Pain) (Bilateral) (R>L) w/ sciatica (Right), Chronic lower extremity pain (Secondary Area of Pain) (Right), Back muscle spasm, Chronic musculoskeletal pain, and Neurogenic pain were also pertinent to this visit.  Plan of Care  I am having Sharon Lawrence start on oxyCODONE,  baclofen, pregabalin, and oxyCODONE. I am also having her maintain her cholecalciferol, Cyanocobalamin (VITAMIN B-12 IJ), promethazine, ondansetron, aspirin, nitroGLYCERIN, pantoprazole, atorvastatin, torsemide, levonorgestrel, nystatin, fluticasone, lactobacillus acidophilus, metoprolol tartrate, clopidogrel, ibuprofen, folic acid, Melatonin, Vitamin D (Ergocalciferol), clonazePAM, DULoxetine, baclofen, oxyCODONE, pregabalin, and famotidine.  Pharmacotherapy (Medications Ordered): Meds ordered this encounter  Medications  . oxyCODONE (OXY IR/ROXICODONE) 5 MG immediate release tablet    Sig: Take 1 tablet (5 mg total) by mouth every 6 (six) hours as needed for severe pain. Must last 30 days.    Dispense:  120 tablet    Refill:  0    Chronic Pain: STOP Act (Not applicable) Fill 1 day early if closed on refill date. Do not fill until: 02/28/2019. To last until: 03/30/2019. Avoid benzodiazepines within 8 hours of opioids  . baclofen (LIORESAL) 20 MG tablet    Sig: Take 1 tablet (20 mg total) by mouth daily.    Dispense:  30 tablet    Refill:  5    Fill one day early if pharmacy is closed on scheduled refill date. May substitute for generic if available.  . pregabalin (LYRICA) 75 MG capsule    Sig: Take 1 capsule (75 mg total) by mouth 3 (three) times daily.    Dispense:  90 capsule    Refill:  3    Fill one day early if pharmacy is closed on scheduled refill date. May substitute for generic if available.  Marland Kitchen oxyCODONE (OXY IR/ROXICODONE) 5 MG immediate release tablet    Sig: Take 1 tablet (5 mg total) by mouth every 6 (six) hours as needed for severe pain. Must last 30 days.    Dispense:  120 tablet    Refill:  0    Chronic Pain: STOP Act (Not applicable) Fill 1 day early if closed on refill date. Do not fill until: 03/30/2019. To last until: 04/29/2019. Avoid benzodiazepines within 8 hours of opioids   Orders:  No orders of the defined types were placed in this encounter.  Follow-up plan:    Return in about 4 months (around 04/28/2019) for (VV), (MM).      Interventional management options:  Planned, scheduled, and/or pending:    No blocks until after July 15th, 2020 due to MI and blood thinners.   Considering:   Diagnostic bilateral lumbar facet nerve block  Possible bilateral lumbar facet RFA  Diagnostic sacroiliac joint nerve block  Possible sacroiliac joint RFA    PRN Procedures:   None at this time     Recent Visits Date Type Provider Dept  11/04/18 Office Visit Milinda Pointer, MD [facility]  Showing recent visits within past 90 days and  meeting all other requirements   Today's Visits Date Type Provider Dept  12/29/18 Office Visit Milinda Pointer, MD [facility]  Showing today's visits and meeting all other requirements   Future Appointments No visits were found meeting these conditions.  Showing future appointments within next 90 days and meeting all other requirements   I discussed the assessment and treatment plan with the patient. The patient was provided an opportunity to ask questions and all were answered. The patient agreed with the plan and demonstrated an understanding of the instructions.  Patient advised to call back or seek an in-person evaluation if the symptoms or condition worsens.  Total duration of non-face-to-face encounter: 13 minutes.  Note by: Gaspar Cola, MD Date: 12/29/2018; Time: 8:11 AM  Note: This dictation was prepared with Dragon dictation. Any transcriptional errors that may result from this process are unintentional.  Disclaimer:  * Given the special circumstances of the COVID-19 pandemic, the federal government has announced that the Office for Civil Rights (OCR) will exercise its enforcement discretion and will not impose penalties on physicians using telehealth in the event of noncompliance with regulatory requirements under the [place] (HIPAA)  in connection with the good faith provision of telehealth during the XX123456 national public health emergency. ([place])

## 2018-12-31 ENCOUNTER — Other Ambulatory Visit: Payer: Self-pay

## 2018-12-31 ENCOUNTER — Encounter (HOSPITAL_COMMUNITY): Payer: Self-pay | Admitting: *Deleted

## 2018-12-31 ENCOUNTER — Emergency Department (HOSPITAL_COMMUNITY): Payer: BC Managed Care – PPO

## 2018-12-31 ENCOUNTER — Emergency Department (HOSPITAL_COMMUNITY)
Admission: EM | Admit: 2018-12-31 | Discharge: 2018-12-31 | Disposition: A | Discharge status: Home / Self Care | Payer: BC Managed Care – PPO | Attending: Emergency Medicine | Admitting: Emergency Medicine

## 2018-12-31 DIAGNOSIS — Z7982 Long term (current) use of aspirin: Secondary | ICD-10-CM | POA: Diagnosis not present

## 2018-12-31 DIAGNOSIS — Z7901 Long term (current) use of anticoagulants: Secondary | ICD-10-CM | POA: Insufficient documentation

## 2018-12-31 DIAGNOSIS — E039 Hypothyroidism, unspecified: Secondary | ICD-10-CM | POA: Insufficient documentation

## 2018-12-31 DIAGNOSIS — S59912A Unspecified injury of left forearm, initial encounter: Secondary | ICD-10-CM | POA: Diagnosis not present

## 2018-12-31 DIAGNOSIS — S50811A Abrasion of right forearm, initial encounter: Secondary | ICD-10-CM | POA: Insufficient documentation

## 2018-12-31 DIAGNOSIS — Y999 Unspecified external cause status: Secondary | ICD-10-CM | POA: Diagnosis not present

## 2018-12-31 DIAGNOSIS — S50812A Abrasion of left forearm, initial encounter: Secondary | ICD-10-CM | POA: Diagnosis not present

## 2018-12-31 DIAGNOSIS — Y92410 Unspecified street and highway as the place of occurrence of the external cause: Secondary | ICD-10-CM | POA: Diagnosis not present

## 2018-12-31 DIAGNOSIS — S6992XA Unspecified injury of left wrist, hand and finger(s), initial encounter: Secondary | ICD-10-CM | POA: Diagnosis not present

## 2018-12-31 DIAGNOSIS — M79642 Pain in left hand: Secondary | ICD-10-CM | POA: Diagnosis not present

## 2018-12-31 DIAGNOSIS — T07XXXA Unspecified multiple injuries, initial encounter: Secondary | ICD-10-CM

## 2018-12-31 DIAGNOSIS — R0689 Other abnormalities of breathing: Secondary | ICD-10-CM | POA: Diagnosis not present

## 2018-12-31 DIAGNOSIS — Z87891 Personal history of nicotine dependence: Secondary | ICD-10-CM | POA: Insufficient documentation

## 2018-12-31 DIAGNOSIS — R609 Edema, unspecified: Secondary | ICD-10-CM | POA: Diagnosis not present

## 2018-12-31 DIAGNOSIS — M7989 Other specified soft tissue disorders: Secondary | ICD-10-CM | POA: Diagnosis not present

## 2018-12-31 DIAGNOSIS — Z79899 Other long term (current) drug therapy: Secondary | ICD-10-CM | POA: Insufficient documentation

## 2018-12-31 DIAGNOSIS — I2510 Atherosclerotic heart disease of native coronary artery without angina pectoris: Secondary | ICD-10-CM | POA: Insufficient documentation

## 2018-12-31 DIAGNOSIS — Y93I9 Activity, other involving external motion: Secondary | ICD-10-CM | POA: Insufficient documentation

## 2018-12-31 DIAGNOSIS — S199XXA Unspecified injury of neck, initial encounter: Secondary | ICD-10-CM | POA: Diagnosis not present

## 2018-12-31 DIAGNOSIS — S30811A Abrasion of abdominal wall, initial encounter: Secondary | ICD-10-CM | POA: Insufficient documentation

## 2018-12-31 DIAGNOSIS — S5012XA Contusion of left forearm, initial encounter: Secondary | ICD-10-CM | POA: Diagnosis not present

## 2018-12-31 DIAGNOSIS — M79602 Pain in left arm: Secondary | ICD-10-CM | POA: Diagnosis not present

## 2018-12-31 DIAGNOSIS — S60222A Contusion of left hand, initial encounter: Secondary | ICD-10-CM | POA: Diagnosis not present

## 2018-12-31 DIAGNOSIS — M79632 Pain in left forearm: Secondary | ICD-10-CM | POA: Diagnosis not present

## 2018-12-31 MED ORDER — METHOCARBAMOL 500 MG PO TABS: 500.0000 mg | ORAL_TABLET | Freq: Three times a day (TID) | ORAL | 0 refills | Status: DC

## 2018-12-31 MED ORDER — ONDANSETRON HCL 4 MG PO TABS
4.0000 mg | ORAL_TABLET | Freq: Once | ORAL | Status: AC
  Administered 2018-12-31: 4 mg via ORAL
  Filled 2018-12-31: qty 1

## 2018-12-31 MED ORDER — IBUPROFEN 800 MG PO TABS
800.0000 mg | ORAL_TABLET | Freq: Once | ORAL | Status: AC
  Administered 2018-12-31: 800 mg via ORAL
  Filled 2018-12-31: qty 1
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
## 2018-12-31 NOTE — Discharge Instructions (Signed)
Your blood pressure is slightly elevated, otherwise your vital signs within normal limits.  Your oxygen level is 95% on room air, within normal limits by my interpretation. The x-ray of your hand, wrist, and forearm show some soft tissue swelling, but no fracture, and no dislocation.  CT scan of your neck is negative for fracture or dislocation. You can expect to be sore in multiple areas over the next 48hours.  You may continue to use your ibuprofen every 6 hours, or Tylenol every 4 hours.  Please use Robaxin for spasm pain 3 times daily as needed. This medication may cause drowsiness. Please do not drink, drive, or participate in activity that requires concentration while taking this medication.  Please see Dr. Bartolo Darter for follow-up in the office if any changes, problems, or concerns.
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
## 2019-01-01 ENCOUNTER — Other Ambulatory Visit (HOSPITAL_COMMUNITY): Payer: Self-pay | Admitting: *Deleted

## 2019-01-01 DIAGNOSIS — D509 Iron deficiency anemia, unspecified: Secondary | ICD-10-CM

## 2019-01-04 ENCOUNTER — Inpatient Hospital Stay (HOSPITAL_COMMUNITY): Payer: BC Managed Care – PPO | Attending: Hematology

## 2019-01-07 ENCOUNTER — Ambulatory Visit (HOSPITAL_COMMUNITY): Payer: BC Managed Care – PPO | Admitting: Nurse Practitioner

## 2019-01-07 NOTE — Assessment & Plan Note (Deleted)
1.  Microcytic anemia: -Labs on 08/06/2018 from Dr. Arliss Journey office shows hemoglobin 11.4 with MCV of 78. -Colonoscopy by Dr. Laural Golden on 09/26/2017 showed normal ileum, 5 nonbleeding polyps in the rectum, in the sigmoid colon, and hepatic flexure.  Also has hemorrhoids. -EGD on 03/29/2010 showed abnormal duodenal mucosa. -Patient last had Feraheme infusions in 2012 and 2014. -Oral iron therapy resulted in severe constipation, nausea and intolerance. -Patient had an extensive work-up including serum copper, CRP, methylmalonic acid, SPEP, ESR and they were all normal.  Reticulocyte count was also normal.  -Her ferritin and percent saturation were low and she was set up for 2 infusions of IV Feraheme her last visit. -Labs were repeated on  2.  Folate/B12 deficiency: -She is taking folic acid 1 mg twice daily for many years. -She also takes B12 once a month. -Methylmalonic acid was normal. -We will continue to follow.  3.  Thrombocytosis: -Platelet count was elevated at 525.  This could be from reactive from underlying iron deficiency. -CT [facility] on 09/25/2017 showed normal-sized spleen without focal abnormalities.  Fatty infiltration of the liver noted.  Stable bilateral adrenal adenomas. -If she is continues to have elevated platelets after her iron repletion, we will consider mutation testing for myeloproliferative disorders. -Her mother has JAK2 positive myeloproliferative neoplasm.

## 2019-01-26 ENCOUNTER — Other Ambulatory Visit: Payer: Self-pay

## 2019-01-26 ENCOUNTER — Ambulatory Visit (INDEPENDENT_AMBULATORY_CARE_PROVIDER_SITE_OTHER): Payer: BC Managed Care – PPO | Admitting: Psychiatry

## 2019-01-26 ENCOUNTER — Encounter: Payer: Self-pay | Admitting: Psychiatry

## 2019-01-26 DIAGNOSIS — F330 Major depressive disorder, recurrent, mild: Secondary | ICD-10-CM | POA: Diagnosis not present

## 2019-01-26 DIAGNOSIS — F411 Generalized anxiety disorder: Secondary | ICD-10-CM

## 2019-01-26 DIAGNOSIS — F410 Panic disorder [episodic paroxysmal anxiety] without agoraphobia: Secondary | ICD-10-CM | POA: Diagnosis not present

## 2019-01-26 DIAGNOSIS — F5105 Insomnia due to other mental disorder: Secondary | ICD-10-CM

## 2019-01-26 MED ORDER — DULOXETINE HCL 20 MG PO CPEP: 20.0000 mg | ORAL_CAPSULE | Freq: Every day | ORAL | 0 refills | Status: DC

## 2019-01-26 MED ORDER — RAMELTEON 8 MG PO TABS: 8.0000 mg | ORAL_TABLET | Freq: Every day | ORAL | 1 refills | Status: DC

## 2019-01-26 MED ORDER — DULOXETINE HCL 60 MG PO CPEP: 60.0000 mg | ORAL_CAPSULE | Freq: Every day | ORAL | 0 refills | Status: DC

## 2019-01-26 MED ORDER — CLONAZEPAM 0.5 MG PO TABS: 0.2500 mg | ORAL_TABLET | ORAL | 1 refills | Status: DC
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
## 2019-01-27 ENCOUNTER — Telehealth: Payer: Self-pay

## 2019-01-27 DIAGNOSIS — M25561 Pain in right knee: Secondary | ICD-10-CM | POA: Diagnosis not present

## 2019-01-27 DIAGNOSIS — F5105 Insomnia due to other mental disorder: Secondary | ICD-10-CM

## 2019-01-27 NOTE — Telephone Encounter (Signed)
Medication management - Patient's prior authorization for Rozerem done online with CoverMyMeds and decision pending.

## 2019-02-02 MED ORDER — ESZOPICLONE 1 MG PO TABS: 1.0000 mg | ORAL_TABLET | Freq: Every evening | ORAL | 0 refills | Status: DC | PRN

## 2019-02-02 NOTE — Telephone Encounter (Signed)
medication was denied. from [facility].

## 2019-02-02 NOTE — Telephone Encounter (Signed)
Returned call to patient.  Ramelteon is not approved by her health insurance plan.  We will start Lunesta 1 mg p.o. nightly.  She agrees with plan.  Provided medication education.  Discussed adverse side effects.

## 2019-02-09 ENCOUNTER — Ambulatory Visit: Payer: BC Managed Care – PPO | Admitting: Orthopaedic Surgery

## 2019-02-09 ENCOUNTER — Encounter: Payer: Self-pay | Admitting: Orthopaedic Surgery

## 2019-02-12 ENCOUNTER — Encounter: Payer: Self-pay | Admitting: Cardiology

## 2019-02-12 ENCOUNTER — Ambulatory Visit: Payer: BC Managed Care – PPO | Admitting: Cardiology

## 2019-02-12 NOTE — Progress Notes (Signed)
Opened in error, patient was no show

## 2019-02-15 ENCOUNTER — Ambulatory Visit (INDEPENDENT_AMBULATORY_CARE_PROVIDER_SITE_OTHER): Payer: BC Managed Care – PPO | Admitting: Psychiatry

## 2019-02-15 ENCOUNTER — Other Ambulatory Visit: Payer: Self-pay

## 2019-02-15 DIAGNOSIS — Z5329 Procedure and treatment not carried out because of patient's decision for other reasons: Secondary | ICD-10-CM

## 2019-02-15 NOTE — Progress Notes (Signed)
No response to call or text.

## 2019-02-17 ENCOUNTER — Other Ambulatory Visit: Payer: Self-pay

## 2019-02-17 ENCOUNTER — Ambulatory Visit: Payer: BC Managed Care – PPO | Admitting: Licensed Clinical Social Worker

## 2019-03-10 ENCOUNTER — Encounter: Payer: Self-pay | Admitting: Psychiatry

## 2019-03-10 ENCOUNTER — Other Ambulatory Visit: Payer: Self-pay

## 2019-03-10 ENCOUNTER — Ambulatory Visit (INDEPENDENT_AMBULATORY_CARE_PROVIDER_SITE_OTHER): Payer: BC Managed Care – PPO | Admitting: Psychiatry

## 2019-03-10 DIAGNOSIS — F411 Generalized anxiety disorder: Secondary | ICD-10-CM

## 2019-03-10 DIAGNOSIS — F5105 Insomnia due to other mental disorder: Secondary | ICD-10-CM

## 2019-03-10 DIAGNOSIS — F410 Panic disorder [episodic paroxysmal anxiety] without agoraphobia: Secondary | ICD-10-CM | POA: Diagnosis not present

## 2019-03-10 DIAGNOSIS — F330 Major depressive disorder, recurrent, mild: Secondary | ICD-10-CM

## 2019-03-10 NOTE — Progress Notes (Signed)
Virtual Visit via Video Note  I connected with Sharon Lawrence on 03/10/19 at  2:30 PM EST by a video enabled telemedicine application and verified that I am speaking with the correct person using two identifiers.   I discussed the limitations of evaluation and management by telemedicine and the availability of in person appointments. The patient expressed understanding and agreed to proceed.     I discussed the assessment and treatment plan with the patient. The patient was provided an opportunity to ask questions and all were answered. The patient agreed with the plan and demonstrated an understanding of the instructions.   The patient was advised to call back or seek an in-person evaluation if the symptoms worsen or if the condition fails to improve as anticipated.   [facility] MD OP Progress Note  03/10/2019 3:40 PM Sharon Lawrence  MRN:  [id number]  Chief Complaint:  Chief Complaint    Follow-up     HPI: Sharon Lawrence is a 54 year old Caucasian female, widowed, employed as a Marine scientist, lives in [place], has a history of panic disorder, GAD, MDD, insomnia, coronary artery disease status post STEMI on 09/15/2017, status post stent placement, hyperlipidemia, chronic anemia was evaluated by telemedicine today.  She reports she had a good holiday season.  She enjoyed with her family.  She however reports it was kind of different due to the pandemic restrictions.  She reports she continues to work and work continues to be going well.  She reports she continues to struggle with sleep issues.  She tried the [place] however she woke up feeling too groggy and hence stopped taking it.  The Cymbalta higher dosage also made her feel more tired during the day.  She has started taking the Cymbalta in the evening and is currently tolerating it well.  She reports her mood symptoms is improving.  Patient denies any suicidality, homicidality or perceptual disturbances.  Patient denies any other concerns today. Visit  Diagnosis:    ICD-10-CM   1. Panic disorder  F41.0   2. GAD (generalized anxiety disorder)  F41.1   3. MDD (major depressive disorder), recurrent episode, mild ([place])  F33.0   4. Insomnia due to mental disorder  F51.05     Past Psychiatric History: I have reviewed past psychiatric history from my progress note on 11/17/2017.  Past trials of Zoloft, Paxil, trazodone  Past Medical History:  Past Medical History:  Diagnosis Date  . ADHD (attention deficit hyperactivity disorder)   . Anxiety   . B12 deficiency 02/07/2011  . Back pain   . CAD (coronary artery disease)    a. s/p recent STEMI on 09/15/2017 with DES to RCA  . Celiac disease   . Folic acid deficiency 76/03/9507  . Headache   . Iron deficiency anemia 02/07/2011  . Myocardial infarction ([place]) 09/15/2017    Past Surgical History:  Procedure Laterality Date  . CHOLECYSTECTOMY    . COLONOSCOPY WITH PROPOFOL N/A 09/26/2017   Procedure: COLONOSCOPY WITH PROPOFOL;  Surgeon: Rogene Houston, MD;  Location: [facility];  Service: Endoscopy;  Laterality: N/A;  . CORONARY STENT INTERVENTION Right 09/15/2017   Procedure: CORONARY STENT INTERVENTION;  Surgeon: Jettie Booze, MD;  Location: [facility];  Service: Cardiovascular;  Laterality: Right;  RCA  . KNEE CARTILAGE SURGERY Right   . LEFT HEART CATH AND CORONARY ANGIOGRAPHY N/A 09/15/2017   Procedure: LEFT HEART CATH AND CORONARY ANGIOGRAPHY;  Surgeon: Jettie Booze, MD;  Location: [facility];  Service: Cardiovascular;  Laterality: N/A;  . TONSILLECTOMY      Family Psychiatric History: I have reviewed family psychiatric history from my progress note on 11/17/2017.  Family History:  Family History  Problem Relation Age of Onset  . Cancer Mother   . Hypertension Mother   . Hypertension Father   . Hypothyroidism Father   . Cancer Sister   . Anxiety disorder Sister   . Depression Sister   . CAD Maternal Grandfather   . Cirrhosis Paternal Grandfather    . CAD Paternal Grandmother   . Diabetes Paternal Grandmother   . Hypothyroidism Paternal Grandmother   . Drug abuse Son   . Bipolar disorder Son     Social History: I have reviewed social history from my progress note on 11/17/2017. Social History   Socioeconomic History  . Marital status: Widowed    Spouse name: Not on file  . Number of children: 3  . Years of education: Not on file  . Highest education level: Associate degree: occupational, Hotel manager, or vocational program  Occupational History  . Not on file  Tobacco Use  . Smoking status: Former Smoker    Packs/day: 0.25    Years: 12.00    Pack years: 3.00    Types: Cigarettes    Quit date: 04/04/2017    Years since quitting: 1.9  . Smokeless tobacco: Never Used  Substance and Sexual Activity  . Alcohol use: No  . Drug use: No  . Sexual activity: Not Currently  Other Topics Concern  . Not on file  Social History Narrative  . Not on file   Social Determinants of Health   Financial Resource Strain:   . Difficulty of Paying Living Expenses: Not on file  Food Insecurity:   . Worried About Charity fundraiser in the Last Year: Not on file  . Ran Out of Food in the Last Year: Not on file  Transportation Needs:   . Lack of Transportation (Medical): Not on file  . Lack of Transportation (Non-Medical): Not on file  Physical Activity:   . Days of Exercise per Week: Not on file  . Minutes of Exercise per Session: Not on file  Stress:   . Feeling of Stress : Not on file  Social Connections:   . Frequency of Communication with Friends and Family: Not on file  . Frequency of Social Gatherings with Friends and Family: Not on file  . Attends Religious Services: Not on file  . Active Member of Clubs or Organizations: Not on file  . Attends Archivist Meetings: Not on file  . Marital Status: Not on file    Allergies:  Allergies  Allergen Reactions  . Bee Venom Anaphylaxis  . Erythromycin Anaphylaxis  .  Cephalexin   . Neomycin-Bacitracin-Polymyxin  [Bacitracin-Neomycin-Polymyxin] Swelling    Sight of application  . Neosporin Original [Bacitracin-Neomycin-Polymyxin] Swelling    Swelling at site of application  . Prednisone   . Sulfa Antibiotics Swelling    Sulfa eye drops caused eyes to swell Sulfa eye drops caused eyes to swell  . Tobrex [Tobramycin] Swelling  . Tramadol Hives  . Penicillins Rash    Has patient had a PCN reaction causing immediate rash, facial/tongue/throat swelling, SOB or lightheadedness with hypotension: Yes Has patient had a PCN reaction causing severe rash involving mucus membranes or skin necrosis: No Has patient had a PCN reaction that required hospitalization: No Has patient had a PCN reaction occurring within the last 10 years: No  If all of the above answers are "NO", then may proceed with Cephalosporin use.     Metabolic Disorder Labs: Lab Results  Component Value Date   HGBA1C 5.6 09/16/2017   MPG 114 09/16/2017   MPG 123 (H) 07/07/2013   No results found for: PROLACTIN Lab Results  Component Value Date   CHOL 77 09/16/2017   TRIG 64 09/16/2017   HDL 19 (L) 09/16/2017   CHOLHDL 4.1 09/16/2017   VLDL 13 09/16/2017   LDLCALC 45 09/16/2017   LDLCALC 97 07/07/2013   Lab Results  Component Value Date   TSH 2.471 07/07/2013   TSH 1.745 06/03/2012    Therapeutic Level Labs: No results found for: LITHIUM No results found for: VALPROATE No components found for:  CBMZ  Current Medications: Current Outpatient Medications  Medication Sig Dispense Refill  . aspirin 81 MG chewable tablet Chew 1 tablet (81 mg total) by mouth daily.    Marland Kitchen atorvastatin (LIPITOR) 40 MG tablet TAKE 1 TABLET BY MOUTH ONCE DAILY AT 6PM. 30 tablet 6  . baclofen (LIORESAL) 20 MG tablet Take 1 tablet (20 mg total) by mouth daily. 30 tablet 5  . clonazePAM (KLONOPIN) 0.5 MG tablet Take 0.5-1 tablets (0.25-0.5 mg total) by mouth as directed. 1-2 times a week as needed for  severe panic sx. 15 tablet 1  . DULoxetine (CYMBALTA) 20 MG capsule Take 1 capsule (20 mg total) by mouth daily. To be combined with 60 mg 90 capsule 0  . DULoxetine (CYMBALTA) 60 MG capsule Take 1 capsule (60 mg total) by mouth daily. To be combined 20 mg 90 capsule 0  . eszopiclone (LUNESTA) 1 MG TABS tablet Take 1 tablet (1 mg total) by mouth at bedtime as needed for sleep. Take immediately before bedtime 30 tablet 0  . famotidine (PEPCID) 40 MG tablet     . fluticasone (FLONASE) 50 MCG/ACT nasal spray Place 1 spray into both nostrils daily. As needed.    . folic acid (FOLVITE) 1 MG tablet Take 1 mg by mouth 2 (two) times a day.    . ibuprofen (ADVIL,MOTRIN) 800 MG tablet 800 mg every 6 (six) hours as needed.     . lactobacillus acidophilus (BACID) TABS tablet Take 1 tablet by mouth daily.    Marland Kitchen levonorgestrel (MIRENA) 20 MCG/24HR IUD by Intrauterine route.    . methocarbamol (ROBAXIN) 500 MG tablet Take 1 tablet (500 mg total) by mouth 3 (three) times daily. 21 tablet 0  . metoprolol tartrate (LOPRESSOR) 25 MG tablet Take 1 tablet (25 mg total) by mouth 2 (two) times daily. 180 tablet 3  . nitroGLYCERIN (NITROSTAT) 0.4 MG SL tablet Place 1 tablet (0.4 mg total) under the tongue every 5 (five) minutes as needed for chest pain. 25 tablet 2  . nystatin (MYCOSTATIN/NYSTOP) powder Apply topically.    . ondansetron (ZOFRAN-ODT) 8 MG disintegrating tablet Take 1 tablet by mouth 3 (three) times daily as needed for nausea.   3  . oxyCODONE (OXY IR/ROXICODONE) 5 MG immediate release tablet Take 1 tablet (5 mg total) by mouth every 6 (six) hours as needed for severe pain. Must last 30 days. 120 tablet 0  . pantoprazole (PROTONIX) 40 MG tablet TAKE 1 TABLET BY MOUTH ONCE DAILY BEFORE BREAKFAST. 30 tablet 6  . pregabalin (LYRICA) 75 MG capsule Take 1 capsule (75 mg total) by mouth 3 (three) times daily. 90 capsule 3  . promethazine (PHENERGAN) 25 MG tablet Take 25 mg by mouth 3 (three) times daily  as needed  for nausea.   2  . torsemide (DEMADEX) 20 MG tablet 20 mg once. As needed.  2  . Vitamin D, Ergocalciferol, (DRISDOL) 1.25 MG (50000 UT) CAPS capsule 1 cap weekly 12 weeks, 12 caps    . clopidogrel (PLAVIX) 75 MG tablet TAKE 1 TABLET BY MOUTH ONCE DAILY. (Patient not taking: Reported on 03/10/2019) 30 tablet 6  . Cyanocobalamin (VITAMIN B-12 IJ) Inject as directed every 30 (thirty) days.      No current facility-administered medications for this visit.     Musculoskeletal: Strength & Muscle Tone: UTA Gait & Station: normal Patient leans: N/A  Psychiatric Specialty Exam: Review of Systems  Psychiatric/Behavioral: Positive for sleep disturbance.  All other systems reviewed and are negative.   There were no vitals taken for this visit.There is no height or weight on file to calculate BMI.  General Appearance: Casual  Eye Contact:  Fair  Speech:  Clear and Coherent  Volume:  Normal  Mood:  Euthymic  Affect:  Congruent  Thought Process:  Goal Directed and Descriptions of Associations: Intact  Orientation:  Full (Time, Place, and Person)  Thought Content: Logical   Suicidal Thoughts:  No  Homicidal Thoughts:  No  Memory:  Immediate;   Fair Recent;   Fair Remote;   Fair  Judgement:  Fair  Insight:  Fair  Psychomotor Activity:  Normal  Concentration:  Concentration: Fair and Attention Span: Fair  Recall:  [employer] of Knowledge: Fair  Language: Fair  Akathisia:  No  Handed:  Right  AIMS (if indicated):   Assets:  Communication Skills Desire for Improvement Housing Social Support  ADL's:  Intact  Cognition: WNL  Sleep:  Poor   Screenings: PHQ2-9     Office Visit from 04/15/2018 in [facility] Clinical Support from 03/18/2018 in [facility] Office Visit from 12/29/2017 in [facility]  PHQ-2 Total Score  0  0  0       Assessment and Plan:  Artavia is a 54 year old Caucasian female, widowed, lives in [place], has a history of depression, anxiety, insomnia, hyperlipidemia, coronary artery disease, MI status post stent placement was evaluated by telemedicine today.  Patient is biologically predisposed given her family history as well as health issues.  Patient with psychosocial stressors of her own health issues and the current pandemic.  Patient continues to struggle with sleep problems and will benefit from medication readjustment, plan as noted below.  Plan Panic disorder-improving Cymbalta 80 mg p.o. daily Clonazepam 0.25-0.5 mg p.o. as needed-1-2 times a week for panic attacks.  She has been limiting use.  GAD-improving Cymbalta as prescribed.   MDD-improving Cymbalta 80 mg p.o. daily  Insomnia-unstable Patient agrees to take a low dosage of Lunesta-0.5 mg.  She will try the dosage and let writer know.  Discussed changing her medication if she continues to have problems.  Follow-up in clinic in 3 to 6 weeks or sooner if needed.  February 18 at 2:20 PM  I have spent atleast 20 minutes non  face to face with patient today. More than 50 % of the time was spent for ordering medications and test ,psychoeducation and supportive psychotherapy and care coordination,as well as documenting clinical information in electronic health record. This note was generated in part or whole with voice recognition software. Voice recognition is usually quite accurate but there are transcription errors that can and very often  do occur. I apologize for any typographical errors that were not detected and corrected.     Ursula Alert, MD 03/10/2019, 3:40 PM

## 2019-03-19 ENCOUNTER — Ambulatory Visit (INDEPENDENT_AMBULATORY_CARE_PROVIDER_SITE_OTHER): Payer: BC Managed Care – PPO | Admitting: Clinical

## 2019-03-19 ENCOUNTER — Other Ambulatory Visit: Payer: Self-pay

## 2019-03-19 DIAGNOSIS — F411 Generalized anxiety disorder: Secondary | ICD-10-CM | POA: Diagnosis not present

## 2019-03-19 DIAGNOSIS — F5105 Insomnia due to other mental disorder: Secondary | ICD-10-CM | POA: Diagnosis not present

## 2019-03-19 DIAGNOSIS — F330 Major depressive disorder, recurrent, mild: Secondary | ICD-10-CM | POA: Diagnosis not present
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
## 2019-03-31 DIAGNOSIS — R6889 Other general symptoms and signs: Secondary | ICD-10-CM | POA: Diagnosis not present

## 2019-04-01 ENCOUNTER — Ambulatory Visit: Payer: BC Managed Care – PPO | Admitting: Orthopaedic Surgery

## 2019-04-06 ENCOUNTER — Ambulatory Visit (HOSPITAL_COMMUNITY): Payer: BC Managed Care – PPO | Admitting: Clinical

## 2019-04-09 DIAGNOSIS — Z20828 Contact with and (suspected) exposure to other viral communicable diseases: Secondary | ICD-10-CM | POA: Diagnosis not present

## 2019-04-09 DIAGNOSIS — Z23 Encounter for immunization: Secondary | ICD-10-CM | POA: Diagnosis not present

## 2019-04-09 DIAGNOSIS — Z20822 Contact with and (suspected) exposure to covid-19: Secondary | ICD-10-CM | POA: Diagnosis not present

## 2019-04-13 ENCOUNTER — Ambulatory Visit: Payer: BC Managed Care – PPO | Admitting: Orthopaedic Surgery

## 2019-04-13 ENCOUNTER — Other Ambulatory Visit: Payer: Self-pay

## 2019-04-13 ENCOUNTER — Encounter: Payer: Self-pay | Admitting: Orthopaedic Surgery

## 2019-04-13 VITALS — BP 134/75 | HR 104 | Ht 60.0 in | Wt 220.0 lb

## 2019-04-13 DIAGNOSIS — G8929 Other chronic pain: Secondary | ICD-10-CM

## 2019-04-13 DIAGNOSIS — M25561 Pain in right knee: Secondary | ICD-10-CM | POA: Diagnosis not present

## 2019-04-13 NOTE — Progress Notes (Signed)
Subjective:    Patient ID: Sharon Lawrence, female    DOB: 1965/08/20, 54 y.o.   MRN: [id number]  HPI  She has right knee pain getting progressively worse over the last six months or so.  She has been seen at [facility] and had x-rays done 01-27-2019 showing osteoarthritis of the right knee with no change since 06-18-2018.  I do not have a CD to review.  I have read the notes from [place].  She has no recent trauma.  She has swelling, popping and giving way of the knee on the right. At times it feels like it will hyperextend but does not. She has increasing giving way.  She has no redness, no numbness.  I did arthroscopy on the knee many years ago and she did well until the last year or so.  She takes ibuprofen 800 mgm bid and also has pain medicine which she takes for chronic lower back pain.   Review of Systems  Constitutional: Positive for activity change.  Musculoskeletal: Positive for arthralgias, back pain, gait problem and joint swelling.  Neurological: Positive for headaches.  Psychiatric/Behavioral: The patient is nervous/anxious.   All other systems reviewed and are negative.  For Review of Systems, all other systems reviewed and are negative.  The following is a summary of the past history medically, past history surgically, known current medicines, social history and family history.  This information is gathered electronically by the computer from prior information and documentation.  I review this each visit and have found including this information at this point in the chart is beneficial and informative.   Past Medical History:  Diagnosis Date  . ADHD (attention deficit hyperactivity disorder)   . Anxiety   . B12 deficiency 02/07/2011  . Back pain   . CAD (coronary artery disease)    a. s/p recent STEMI on 09/15/2017 with DES to RCA  . Celiac disease   . Folic acid deficiency 99/04/4266  . Headache   . Iron deficiency anemia 02/07/2011  . Myocardial  infarction ([place]) 09/15/2017    Past Surgical History:  Procedure Laterality Date  . CHOLECYSTECTOMY    . COLONOSCOPY WITH PROPOFOL N/A 09/26/2017   Procedure: COLONOSCOPY WITH PROPOFOL;  Surgeon: Rogene Houston, MD;  [facility];  Service: Endoscopy;  Laterality: N/A;  . CORONARY STENT INTERVENTION Right 09/15/2017   Procedure: CORONARY STENT INTERVENTION;  Surgeon: Jettie Booze, MD;  Location: [place] [facility] LAB;  Service: Cardiovascular;  Laterality: Right;  RCA  . KNEE CARTILAGE SURGERY Right   . LEFT HEART CATH AND CORONARY ANGIOGRAPHY N/A 09/15/2017   Procedure: LEFT HEART CATH AND CORONARY ANGIOGRAPHY;  Surgeon: Jettie Booze, MD;  Location: [place] CV LAB;  Service: Cardiovascular;  Laterality: N/A;  . TONSILLECTOMY      Current Outpatient Medications on File Prior to Visit  Medication Sig Dispense Refill  . aspirin 81 MG chewable tablet Chew 1 tablet (81 mg total) by mouth daily.    Marland Kitchen atorvastatin (LIPITOR) 40 MG tablet TAKE 1 TABLET BY MOUTH ONCE DAILY AT 6PM. 30 tablet 6  . baclofen (LIORESAL) 20 MG tablet Take 1 tablet (20 mg total) by mouth daily. 30 tablet 5  . clonazePAM (KLONOPIN) 0.5 MG tablet Take 0.5-1 tablets (0.25-0.5 mg total) by mouth as directed. 1-2 times a week as needed for severe panic sx. 15 tablet 1  . Cyanocobalamin (VITAMIN B-12 IJ) Inject as directed every 30 (thirty) days.     Marland Kitchen  DULoxetine (CYMBALTA) 20 MG capsule Take 1 capsule (20 mg total) by mouth daily. To be combined with 60 mg 90 capsule 0  . DULoxetine (CYMBALTA) 60 MG capsule Take 1 capsule (60 mg total) by mouth daily. To be combined 20 mg 90 capsule 0  . eszopiclone (LUNESTA) 1 MG TABS tablet Take 1 tablet (1 mg total) by mouth at bedtime as needed for sleep. Take immediately before bedtime 30 tablet 0  . famotidine (PEPCID) 40 MG tablet     . fluticasone (FLONASE) 50 MCG/ACT nasal spray Place 1 spray into both nostrils daily. As needed.    . folic acid (FOLVITE) 1  MG tablet Take 1 mg by mouth 2 (two) times a day.    . ibuprofen (ADVIL,MOTRIN) 800 MG tablet 800 mg every 6 (six) hours as needed.     . lactobacillus acidophilus (BACID) TABS tablet Take 1 tablet by mouth daily.    Marland Kitchen levonorgestrel (MIRENA) 20 MCG/24HR IUD by Intrauterine route.    . methocarbamol (ROBAXIN) 500 MG tablet Take 1 tablet (500 mg total) by mouth 3 (three) times daily. 21 tablet 0  . nitroGLYCERIN (NITROSTAT) 0.4 MG SL tablet Place 1 tablet (0.4 mg total) under the tongue every 5 (five) minutes as needed for chest pain. 25 tablet 2  . nystatin (MYCOSTATIN/NYSTOP) powder Apply topically.    . ondansetron (ZOFRAN-ODT) 8 MG disintegrating tablet Take 1 tablet by mouth 3 (three) times daily as needed for nausea.   3  . pantoprazole (PROTONIX) 40 MG tablet TAKE 1 TABLET BY MOUTH ONCE DAILY BEFORE BREAKFAST. 30 tablet 6  . pregabalin (LYRICA) 75 MG capsule Take 1 capsule (75 mg total) by mouth 3 (three) times daily. 90 capsule 3  . promethazine (PHENERGAN) 25 MG tablet Take 25 mg by mouth 3 (three) times daily as needed for nausea.   2  . torsemide (DEMADEX) 20 MG tablet 20 mg once. As needed.  2  . Vitamin D, Ergocalciferol, (DRISDOL) 1.25 MG (50000 UT) CAPS capsule 1 cap weekly 12 weeks, 12 caps    . clopidogrel (PLAVIX) 75 MG tablet TAKE 1 TABLET BY MOUTH ONCE DAILY. (Patient not taking: Reported on 03/10/2019) 30 tablet 6  . metoprolol tartrate (LOPRESSOR) 25 MG tablet Take 1 tablet (25 mg total) by mouth 2 (two) times daily. 180 tablet 3  . oxyCODONE (OXY IR/ROXICODONE) 5 MG immediate release tablet Take 1 tablet (5 mg total) by mouth every 6 (six) hours as needed for severe pain. Must last 30 days. 120 tablet 0   No current facility-administered medications on file prior to visit.    Social History   Socioeconomic History  . Marital status: Widowed    Spouse name: Not on file  . Number of children: 3  . Years of education: Not on file  . Highest education level: Associate  degree: occupational, Hotel manager, or vocational program  Occupational History  . Not on file  Tobacco Use  . Smoking status: Former Smoker    Packs/day: 0.25    Years: 12.00    Pack years: 3.00    Types: Cigarettes    Quit date: 04/04/2017    Years since quitting: 2.0  . Smokeless tobacco: Never Used  Substance and Sexual Activity  . Alcohol use: No  . Drug use: No  . Sexual activity: Not Currently  Other Topics Concern  . Not on file  Social History Narrative  . Not on file   Social Determinants of Health   Financial Resource  Strain:   . Difficulty of Paying Living Expenses: Not on file  Food Insecurity:   . Worried About Charity fundraiser in the Last Year: Not on file  . Ran Out of Food in the Last Year: Not on file  Transportation Needs:   . Lack of Transportation (Medical): Not on file  . Lack of Transportation (Non-Medical): Not on file  Physical Activity:   . Days of Exercise per Week: Not on file  . Minutes of Exercise per Session: Not on file  Stress:   . Feeling of Stress : Not on file  Social Connections:   . Frequency of Communication with Friends and Family: Not on file  . Frequency of Social Gatherings with Friends and Family: Not on file  . Attends Religious Services: Not on file  . Active Member of Clubs or Organizations: Not on file  . Attends Archivist Meetings: Not on file  . Marital Status: Not on file  Intimate Partner Violence:   . Fear of Current or Ex-Partner: Not on file  . Emotionally Abused: Not on file  . Physically Abused: Not on file  . Sexually Abused: Not on file    Family History  Problem Relation Age of Onset  . Cancer Mother   . Hypertension Mother   . Hypertension Father   . Hypothyroidism Father   . Cancer Sister   . Anxiety disorder Sister   . Depression Sister   . CAD Maternal Grandfather   . Cirrhosis Paternal Grandfather   . CAD Paternal Grandmother   . Diabetes Paternal Grandmother   . Hypothyroidism  Paternal Grandmother   . Drug abuse Son   . Bipolar disorder Son     BP 134/75   Pulse (!) 104   Ht 5' (1.524 m)   Wt 220 lb (99.8 kg)   BMI 42.97 kg/m   Body mass index is 42.97 kg/m.     Objective:   Physical Exam Vitals and nursing note reviewed.  Constitutional:      Appearance: She is well-developed.  HENT:     Head: Normocephalic and atraumatic.  Eyes:     Conjunctiva/sclera: Conjunctivae normal.     Pupils: Pupils are equal, round, and reactive to light.  Cardiovascular:     Rate and Rhythm: Normal rate and regular rhythm.  Pulmonary:     Effort: Pulmonary effort is normal.  Abdominal:     Palpations: Abdomen is soft.  Musculoskeletal:     Cervical back: Normal range of motion and neck supple.       Legs:  Skin:    General: Skin is warm and dry.  Neurological:     Mental Status: She is alert and oriented to person, place, and time.     Cranial Nerves: No cranial nerve deficit.     Motor: No abnormal muscle tone.     Coordination: Coordination normal.     Deep Tendon Reflexes: Reflexes are normal and symmetric. Reflexes normal.  Psychiatric:        Behavior: Behavior normal.        Thought Content: Thought content normal.        Judgment: Judgment normal.           Assessment & Plan:   Encounter Diagnosis  Name Primary?  . Chronic pain of right knee Yes   I will get MRI of the right knee.  She is to continue the ibuprofen.  She just finished a prednisone pack and I  will not give intra-articular injection to the knee.  Return in two weeks.  Call if any problem.  Precautions discussed.   Electronically Signed Sanjuana Kava, MD 2/9/20218:18 AM

## 2019-04-22 ENCOUNTER — Telehealth: Payer: Self-pay

## 2019-04-22 ENCOUNTER — Ambulatory Visit: Payer: BC Managed Care – PPO | Admitting: Psychiatry

## 2019-04-22 NOTE — Telephone Encounter (Signed)
Attempted to call patient for Mondays visit. No answer. Unable to leave message r/t full mailbo
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
## 2019-04-26 ENCOUNTER — Ambulatory Visit: Payer: BC Managed Care – PPO | Attending: Pain Medicine | Admitting: Pain Medicine

## 2019-04-26 ENCOUNTER — Encounter: Payer: Self-pay | Admitting: Pain Medicine

## 2019-04-26 ENCOUNTER — Other Ambulatory Visit: Payer: Self-pay

## 2019-04-26 DIAGNOSIS — M792 Neuralgia and neuritis, unspecified: Secondary | ICD-10-CM

## 2019-04-26 DIAGNOSIS — M79604 Pain in right leg: Secondary | ICD-10-CM

## 2019-04-26 DIAGNOSIS — M5441 Lumbago with sciatica, right side: Secondary | ICD-10-CM

## 2019-04-26 DIAGNOSIS — G894 Chronic pain syndrome: Secondary | ICD-10-CM

## 2019-04-26 DIAGNOSIS — G8929 Other chronic pain: Secondary | ICD-10-CM

## 2019-04-26 MED ORDER — PREGABALIN 75 MG PO CAPS: 75.0000 mg | ORAL_CAPSULE | Freq: Three times a day (TID) | ORAL | 5 refills | Status: DC

## 2019-04-26 MED ORDER — OXYCODONE HCL 5 MG PO TABS: 5.0000 mg | ORAL_TABLET | Freq: Four times a day (QID) | ORAL | 0 refills | Status: DC | PRN

## 2019-04-27 ENCOUNTER — Ambulatory Visit: Payer: BC Managed Care – PPO | Admitting: Orthopaedic Surgery

## 2019-05-03 ENCOUNTER — Other Ambulatory Visit: Payer: Self-pay | Admitting: Psychiatry

## 2019-05-03 DIAGNOSIS — F330 Major depressive disorder, recurrent, mild: Secondary | ICD-10-CM

## 2019-05-03 DIAGNOSIS — F411 Generalized anxiety disorder: Secondary | ICD-10-CM

## 2019-05-03 DIAGNOSIS — F410 Panic disorder [episodic paroxysmal anxiety] without agoraphobia: Secondary | ICD-10-CM

## 2019-05-07 DIAGNOSIS — Z20822 Contact with and (suspected) exposure to covid-19: Secondary | ICD-10-CM | POA: Diagnosis not present

## 2019-05-07 DIAGNOSIS — Z20828 Contact with and (suspected) exposure to other viral communicable diseases: Secondary | ICD-10-CM | POA: Diagnosis not present

## 2019-05-10 DIAGNOSIS — R6889 Other general symptoms and signs: Secondary | ICD-10-CM | POA: Diagnosis not present

## 2019-05-13 DIAGNOSIS — D518 Other vitamin B12 deficiency anemias: Secondary | ICD-10-CM | POA: Diagnosis not present

## 2019-05-13 DIAGNOSIS — E559 Vitamin D deficiency, unspecified: Secondary | ICD-10-CM | POA: Diagnosis not present

## 2019-05-13 DIAGNOSIS — I10 Essential (primary) hypertension: Secondary | ICD-10-CM | POA: Diagnosis not present

## 2019-05-13 DIAGNOSIS — E039 Hypothyroidism, unspecified: Secondary | ICD-10-CM | POA: Diagnosis not present

## 2019-06-03 ENCOUNTER — Ambulatory Visit (HOSPITAL_COMMUNITY)
Admission: RE | Admit: 2019-06-03 | Discharge: 2019-06-03 | Disposition: A | Discharge status: Home / Self Care | Payer: BC Managed Care – PPO | Source: Ambulatory Visit | Attending: Orthopaedic Surgery | Admitting: Orthopaedic Surgery

## 2019-06-03 ENCOUNTER — Other Ambulatory Visit: Payer: Self-pay

## 2019-06-03 DIAGNOSIS — M25561 Pain in right knee: Secondary | ICD-10-CM | POA: Diagnosis not present

## 2019-06-03 DIAGNOSIS — G8929 Other chronic pain: Secondary | ICD-10-CM | POA: Insufficient documentation

## 2019-06-03 DIAGNOSIS — M25461 Effusion, right knee: Secondary | ICD-10-CM | POA: Diagnosis not present

## 2019-06-08 ENCOUNTER — Other Ambulatory Visit: Payer: Self-pay

## 2019-06-08 ENCOUNTER — Encounter: Payer: Self-pay | Admitting: Orthopaedic Surgery

## 2019-06-08 ENCOUNTER — Ambulatory Visit: Payer: BC Managed Care – PPO | Admitting: Orthopaedic Surgery

## 2019-06-08 VITALS — Temp 97.8°F | Ht 60.0 in | Wt 219.0 lb

## 2019-06-08 DIAGNOSIS — Z6841 Body Mass Index (BMI) 40.0 and over, adult: Secondary | ICD-10-CM

## 2019-06-08 DIAGNOSIS — G8929 Other chronic pain: Secondary | ICD-10-CM

## 2019-06-08 DIAGNOSIS — M25561 Pain in right knee: Secondary | ICD-10-CM

## 2019-06-08 NOTE — Progress Notes (Signed)
Patient ID:Sharon Lawrence, female DOB:[birth year], 54 y.o. HYQ:[id number]  Chief Complaint  Patient presents with  . Knee Pain    right     HPI  Sharon Lawrence is a 54 y.o. female who has right knee pain.  She has good and bad days.  She has swelling and giving way at times.  She had MRI which showed: IMPRESSION: 1. Tricompartmental degenerative changes most significant at the lateral patellofemoral joint (grade 4 chondromalacia). 2. Intact ligamentous structures and no acute bony findings. 3. No meniscal tears. 4. Small joint effusion.  I have independently reviewed the MRI.  I have explained the findings to her.  I would not recommend any surgery at this time.  Continue the ibuprofen.  Body mass index is 42.77 kg/m.  The patient meets the AMA guidelines for Morbid (severe) obesity with a BMI > 40.0 and I have recommended weight loss.   ROS  Review of Systems  Constitutional: Positive for activity change.  Musculoskeletal: Positive for arthralgias, back pain, gait problem and joint swelling.  Neurological: Positive for headaches.  Psychiatric/Behavioral: Behavioral problem: .dx. The patient is nervous/anxious.   All other systems reviewed and are negative.   All other systems reviewed and are negative.  The following is a summary of the past history medically, past history surgically, known current medicines, social history and family history.  This information is gathered electronically by the computer from prior information and documentation.  I review this each visit and have found including this information at this point in the chart is beneficial and informative.    Past Medical History:  Diagnosis Date  . ADHD (attention deficit hyperactivity disorder)   . Anxiety   . B12 deficiency 02/07/2011  . Back pain   . CAD (coronary artery disease)    a. s/p recent STEMI on 09/15/2017 with DES to RCA  . Celiac disease   . Folic acid deficiency 95/04/8411  . Headache   . Iron  deficiency anemia 02/07/2011  . Myocardial infarction ([place]) 09/15/2017    Past Surgical History:  Procedure Laterality Date  . CHOLECYSTECTOMY    . COLONOSCOPY WITH PROPOFOL N/A 09/26/2017   Procedure: COLONOSCOPY WITH PROPOFOL;  Surgeon: Rogene Houston, MD;  [facility];  Service: Endoscopy;  Laterality: N/A;  . CORONARY STENT INTERVENTION Right 09/15/2017   Procedure: CORONARY STENT INTERVENTION;  Surgeon: Jettie Booze, MD;  Location: [facility];  Service: Cardiovascular;  Laterality: Right;  RCA  . KNEE CARTILAGE SURGERY Right   . LEFT HEART CATH AND CORONARY ANGIOGRAPHY N/A 09/15/2017   Procedure: LEFT HEART CATH AND CORONARY ANGIOGRAPHY;  Surgeon: Jettie Booze, MD;  Location: [facility];  Service: Cardiovascular;  Laterality: N/A;  . TONSILLECTOMY      Family History  Problem Relation Age of Onset  . Cancer Mother   . Hypertension Mother   . Hypertension Father   . Hypothyroidism Father   . Cancer Sister   . Anxiety disorder Sister   . Depression Sister   . CAD Maternal Grandfather   . Cirrhosis Paternal Grandfather   . CAD Paternal Grandmother   . Diabetes Paternal Grandmother   . Hypothyroidism Paternal Grandmother   . Drug abuse Son   . Bipolar disorder Son     Social History Social History   Tobacco Use  . Smoking status: Former Smoker    Packs/day: 0.25    Years: 12.00    Pack years: 3.00  Types: Cigarettes    Quit date: 04/04/2017    Years since quitting: 2.1  . Smokeless tobacco: Never Used  Substance Use Topics  . Alcohol use: No  . Drug use: No    Allergies  Allergen Reactions  . Bee Venom Anaphylaxis  . Erythromycin Anaphylaxis  . Cephalexin   . Neomycin-Bacitracin-Polymyxin  [Bacitracin-Neomycin-Polymyxin] Swelling    Sight of application  . Neosporin Original [Bacitracin-Neomycin-Polymyxin] Swelling    Swelling at site of application  . Prednisone   . Sulfa Antibiotics Swelling    Sulfa eye drops  caused eyes to swell Sulfa eye drops caused eyes to swell  . Tobrex [Tobramycin] Swelling  . Tramadol Hives  . Penicillins Rash    Has patient had a PCN reaction causing immediate rash, facial/tongue/throat swelling, SOB or lightheadedness with hypotension: Yes Has patient had a PCN reaction causing severe rash involving mucus membranes or skin necrosis: No Has patient had a PCN reaction that required hospitalization: No Has patient had a PCN reaction occurring within the last 10 years: No If all of the above answers are "NO", then may proceed with Cephalosporin use.     Current Outpatient Medications  Medication Sig Dispense Refill  . aspirin 81 MG chewable tablet Chew 1 tablet (81 mg total) by mouth daily.    Marland Kitchen atorvastatin (LIPITOR) 40 MG tablet TAKE 1 TABLET BY MOUTH ONCE DAILY AT 6PM. 30 tablet 6  . baclofen (LIORESAL) 20 MG tablet Take 1 tablet (20 mg total) by mouth daily. 30 tablet 5  . clonazePAM (KLONOPIN) 0.5 MG tablet Take 0.5-1 tablets (0.25-0.5 mg total) by mouth as directed. 1-2 times a week as needed for severe panic sx. 15 tablet 1  . Cyanocobalamin (VITAMIN B-12 IJ) Inject as directed every 30 (thirty) days.     . DULoxetine (CYMBALTA) 20 MG capsule TAKE (1) CAPSULE BY MOUTH ONCE DAILY. *TAKE WITH 60MG CAP* 90 capsule 0  . DULoxetine (CYMBALTA) 60 MG capsule TAKE (1) CAPSULE BY MOUTH ONCE DAILY. 90 capsule 0  . famotidine (PEPCID) 40 MG tablet daily.     . fluticasone (FLONASE) 50 MCG/ACT nasal spray Place 1 spray into both nostrils daily. As needed.    . folic acid (FOLVITE) 1 MG tablet Take 1 mg by mouth 2 (two) times a day.    . ibuprofen (ADVIL,MOTRIN) 800 MG tablet 800 mg every 6 (six) hours as needed.     . lactobacillus acidophilus (BACID) TABS tablet Take 1 tablet by mouth daily.    Marland Kitchen levonorgestrel (MIRENA) 20 MCG/24HR IUD by Intrauterine route.    . nitroGLYCERIN (NITROSTAT) 0.4 MG SL tablet Place 1 tablet (0.4 mg total) under the tongue every 5 (five) minutes  as needed for chest pain. 25 tablet 2  . nystatin (MYCOSTATIN/NYSTOP) powder Apply topically.    . ondansetron (ZOFRAN-ODT) 8 MG disintegrating tablet Take 1 tablet by mouth 3 (three) times daily as needed for nausea.   3  . oxyCODONE (OXY IR/ROXICODONE) 5 MG immediate release tablet Take 1 tablet (5 mg total) by mouth every 6 (six) hours as needed for severe pain. Must last 30 days. 120 tablet 0  . [START ON 06/20/2019] oxyCODONE (OXY IR/ROXICODONE) 5 MG immediate release tablet Take 1 tablet (5 mg total) by mouth every 6 (six) hours as needed for severe pain. Must last 30 days. 120 tablet 0  . [START ON 07/20/2019] oxyCODONE (OXY IR/ROXICODONE) 5 MG immediate release tablet Take 1 tablet (5 mg total) by mouth every 6 (six) hours  as needed for severe pain. Must last 30 days. 120 tablet 0  . pantoprazole (PROTONIX) 40 MG tablet TAKE 1 TABLET BY MOUTH ONCE DAILY BEFORE BREAKFAST. 30 tablet 6  . pregabalin (LYRICA) 75 MG capsule Take 1 capsule (75 mg total) by mouth 3 (three) times daily. 90 capsule 5  . promethazine (PHENERGAN) 25 MG tablet Take 25 mg by mouth 3 (three) times daily as needed for nausea.   2  . torsemide (DEMADEX) 20 MG tablet 20 mg once. As needed.  2  . Vitamin D, Ergocalciferol, (DRISDOL) 1.25 MG (50000 UT) CAPS capsule 1 cap weekly 12 weeks, 12 caps    . clopidogrel (PLAVIX) 75 MG tablet TAKE 1 TABLET BY MOUTH ONCE DAILY. (Patient not taking: Reported on 06/08/2019) 30 tablet 6  . metoprolol tartrate (LOPRESSOR) 25 MG tablet Take 1 tablet (25 mg total) by mouth 2 (two) times daily. 180 tablet 3   No current facility-administered medications for this visit.     Physical Exam  Temperature 97.8 F (36.6 C), height 5' (1.524 m), weight 219 lb (99.3 kg).  Constitutional: overall normal hygiene, normal nutrition, well developed, normal grooming, normal body habitus. Assistive device:none  Musculoskeletal: gait and station Limp right, muscle tone and strength are normal, no tremors  or atrophy is present.  .  Neurological: coordination overall normal.  Deep tendon reflex/nerve stretch intact.  Sensation normal.  Cranial nerves II-XII intact.   Skin:   Normal overall no scars, lesions, ulcers or rashes. No psoriasis.  Psychiatric: Alert and oriented x 3.  Recent memory intact, remote memory unclear.  Normal mood and affect. Well groomed.  Good eye contact.  Cardiovascular: overall no swelling, no varicosities, no edema bilaterally, normal temperatures of the legs and arms, no clubbing, cyanosis and good capillary refill.  Lymphatic: palpation is normal.  Right knee is tender, slight effusion, crepitus, ROM 0 to 110, limp right, NV intact.  All other systems reviewed and are negative   The patient has been educated about the nature of the problem(s) and counseled on treatment options.  The patient appeared to understand what I have discussed and is in agreement with it.  Encounter Diagnoses  Name Primary?  . Chronic pain of right knee Yes  . Body mass index 40.0-44.9, adult ([place])   . Body mass index 45.0-49.9, adult [facility])     PLAN Call if any problems.  Precautions discussed.  Continue current medications.   Return to clinic 3 months   Electronically Signed Sanjuana Kava, MD 4/6/20219:30 AM

## 2019-06-17 DIAGNOSIS — H6691 Otitis media, unspecified, right ear: Secondary | ICD-10-CM | POA: Diagnosis not present

## 2019-06-23 ENCOUNTER — Inpatient Hospital Stay (HOSPITAL_COMMUNITY): Payer: BC Managed Care – PPO | Attending: Nurse Practitioner | Admitting: Nurse Practitioner

## 2019-06-23 ENCOUNTER — Other Ambulatory Visit: Payer: Self-pay

## 2019-06-23 DIAGNOSIS — D473 Essential (hemorrhagic) thrombocythemia: Secondary | ICD-10-CM | POA: Insufficient documentation

## 2019-06-23 DIAGNOSIS — D123 Benign neoplasm of transverse colon: Secondary | ICD-10-CM | POA: Insufficient documentation

## 2019-06-23 DIAGNOSIS — D125 Benign neoplasm of sigmoid colon: Secondary | ICD-10-CM | POA: Diagnosis not present

## 2019-06-23 DIAGNOSIS — D509 Iron deficiency anemia, unspecified: Secondary | ICD-10-CM

## 2019-06-23 DIAGNOSIS — K649 Unspecified hemorrhoids: Secondary | ICD-10-CM | POA: Insufficient documentation

## 2019-06-23 DIAGNOSIS — Z79899 Other long term (current) drug therapy: Secondary | ICD-10-CM | POA: Diagnosis not present

## 2019-06-23 DIAGNOSIS — D3501 Benign neoplasm of right adrenal gland: Secondary | ICD-10-CM | POA: Insufficient documentation

## 2019-06-23 DIAGNOSIS — D3502 Benign neoplasm of left adrenal gland: Secondary | ICD-10-CM | POA: Insufficient documentation

## 2019-06-23 DIAGNOSIS — K760 Fatty (change of) liver, not elsewhere classified: Secondary | ICD-10-CM | POA: Insufficient documentation

## 2019-06-23 NOTE — Progress Notes (Signed)
Sharon Lawrence, [place] [id number]   CLINIC:  Medical Oncology/Hematology  PCP:  Vidal Schwalbe, MD [address] [place] [address] [phone]   REASON FOR VISIT: Follow-up for microcytic anemia   CURRENT THERAPY: Intermittent iron infusions   INTERVAL HISTORY:  Sharon Lawrence 54 y.o. female returns for routine follow-up for microcytic anemia.  Patient reports she is feeling more fatigued.  She denies any bright red bleeding per rectum or melena.  She denies any easy bruising or bleeding. Denies any nausea, vomiting, or diarrhea. Denies any new pains. Had not noticed any recent bleeding such as epistaxis, hematuria or hematochezia. Denies recent chest pain on exertion, shortness of breath on minimal exertion, pre-syncopal episodes, or palpitations. Denies any numbness or tingling in hands or feet. Denies any recent fevers, infections, or recent hospitalizations. Patient reports appetite at 100% and energy level at 75%.  She is eating well maintain her weight at this time.    REVIEW OF SYSTEMS:  Review of Systems  Neurological: Positive for numbness.  Psychiatric/Behavioral: Positive for depression. The patient is nervous/anxious.   All other systems reviewed and are negative.    PAST MEDICAL/SURGICAL HISTORY:  Past Medical History:  Diagnosis Date  . ADHD (attention deficit hyperactivity disorder)   . Anxiety   . B12 deficiency 02/07/2011  . Back pain   . CAD (coronary artery disease)    a. s/p recent STEMI on 09/15/2017 with DES to RCA  . Celiac disease   . Folic acid deficiency 68/03/2749  . Headache   . Iron deficiency anemia 02/07/2011  . Myocardial infarction ([facility]) 09/15/2017   Past Surgical History:  Procedure Laterality Date  . CHOLECYSTECTOMY    . COLONOSCOPY WITH PROPOFOL N/A 09/26/2017   Procedure: COLONOSCOPY WITH PROPOFOL;  Surgeon: Rogene [place], MD;  Location: [facility];  Service: Endoscopy;  Laterality: N/A;  .  CORONARY STENT INTERVENTION Right 09/15/2017   Procedure: CORONARY STENT INTERVENTION;  Surgeon: Jettie Booze, MD;  Location: [facility];  Service: Cardiovascular;  Laterality: Right;  RCA  . KNEE CARTILAGE SURGERY Right   . LEFT HEART CATH AND CORONARY ANGIOGRAPHY N/A 09/15/2017   Procedure: LEFT HEART CATH AND CORONARY ANGIOGRAPHY;  Surgeon: Jettie Booze, MD;  Location: [facility];  Service: Cardiovascular;  Laterality: N/A;  . TONSILLECTOMY       SOCIAL HISTORY:  Social History   Socioeconomic History  . Marital status: Widowed    Spouse name: Not on file  . Number of children: 3  . Years of education: Not on file  . Highest education level: Associate degree: occupational, Hotel manager, or vocational program  Occupational History  . Not on file  Tobacco Use  . Smoking status: Former Smoker    Packs/day: 0.25    Years: 12.00    Pack years: 3.00    Types: Cigarettes    Quit date: 04/04/2017    Years since quitting: 2.2  . Smokeless tobacco: Never Used  Substance and Sexual Activity  . Alcohol use: No  . Drug use: No  . Sexual activity: Not Currently  Other Topics Concern  . Not on file  Social History Narrative  . Not on file   Social Determinants of Health   Financial Resource Strain:   . Difficulty of Paying Living Expenses:   Food Insecurity:   . Worried About Charity fundraiser in the Last Year:   . [employer] of  Food in the Last Year:   Transportation Needs:   . Film/video editor (Medical):   Marland Kitchen Lack of Transportation (Non-Medical):   Physical Activity:   . Days of Exercise per Week:   . Minutes of Exercise per Session:   Stress:   . Feeling of Stress :   Social Connections:   . Frequency of Communication with Friends and Family:   . Frequency of Social Gatherings with Friends and Family:   . Attends Religious Services:   . Active Member of Clubs or Organizations:   . Attends Archivist Meetings:   Marland Kitchen Marital Status:    Intimate Partner Violence:   . Fear of Current or Ex-Partner:   . Emotionally Abused:   Marland Kitchen Physically Abused:   . Sexually Abused:     FAMILY HISTORY:  Family History  Problem Relation Age of Onset  . Cancer Mother   . Hypertension Mother   . Hypertension Father   . Hypothyroidism Father   . Cancer Sister   . Anxiety disorder Sister   . Depression Sister   . CAD Maternal Grandfather   . Cirrhosis Paternal Grandfather   . CAD Paternal Grandmother   . Diabetes Paternal Grandmother   . Hypothyroidism Paternal Grandmother   . Drug abuse Son   . Bipolar disorder Son     CURRENT MEDICATIONS:  Outpatient Encounter Medications as of 06/23/2019  Medication Sig Note  . aspirin 81 MG chewable tablet Chew 1 tablet (81 mg total) by mouth daily.   Marland Kitchen atorvastatin (LIPITOR) 40 MG tablet TAKE 1 TABLET BY MOUTH ONCE DAILY AT 6PM.   . baclofen (LIORESAL) 20 MG tablet Take 1 tablet (20 mg total) by mouth daily.   . clopidogrel (PLAVIX) 75 MG tablet TAKE 1 TABLET BY MOUTH ONCE DAILY.   Marland Kitchen Cyanocobalamin (VITAMIN B-12 IJ) Inject as directed every 30 (thirty) days.    Marland Kitchen doxycycline (VIBRAMYCIN) 100 MG capsule Take 100 mg by mouth 2 (two) times daily.   . DULoxetine (CYMBALTA) 20 MG capsule TAKE (1) CAPSULE BY MOUTH ONCE DAILY. *TAKE WITH 60MG CAP*   . DULoxetine (CYMBALTA) 60 MG capsule TAKE (1) CAPSULE BY MOUTH ONCE DAILY.   . famotidine (PEPCID) 40 MG tablet daily.    . fluticasone (FLONASE) 50 MCG/ACT nasal spray Place 1 spray into both nostrils daily. As needed.   . folic acid (FOLVITE) 1 MG tablet Take 1 mg by mouth 2 (two) times a day.   . lactobacillus acidophilus (BACID) TABS tablet Take 1 tablet by mouth daily.   Marland Kitchen levonorgestrel (MIRENA) 20 MCG/24HR IUD by Intrauterine route.   . metoprolol tartrate (LOPRESSOR) 25 MG tablet Take 1 tablet (25 mg total) by mouth 2 (two) times daily.   Marland Kitchen oxyCODONE (OXY IR/ROXICODONE) 5 MG immediate release tablet Take 1 tablet (5 mg total) by mouth every 6  (six) hours as needed for severe pain. Must last 30 days.   Derrill Memo ON 07/20/2019] oxyCODONE (OXY IR/ROXICODONE) 5 MG immediate release tablet Take 1 tablet (5 mg total) by mouth every 6 (six) hours as needed for severe pain. Must last 30 days.   . pantoprazole (PROTONIX) 40 MG tablet TAKE 1 TABLET BY MOUTH ONCE DAILY BEFORE BREAKFAST. 08/17/2018: DO NOT DELETE, even if Expired!!! See Dr. Adalberto Cole care coordination note.  . predniSONE (DELTASONE) 20 MG tablet    . pregabalin (LYRICA) 75 MG capsule Take 1 capsule (75 mg total) by mouth 3 (three) times daily.   . Vitamin D, Ergocalciferol, (  DRISDOL) 1.25 MG (50000 UT) CAPS capsule 1 cap weekly 12 weeks, 12 caps   . clonazePAM (KLONOPIN) 0.5 MG tablet Take 0.5-1 tablets (0.25-0.5 mg total) by mouth as directed. 1-2 times a week as needed for severe panic sx. (Patient not taking: Reported on 06/23/2019)   . ibuprofen (ADVIL,MOTRIN) 800 MG tablet 800 mg every 6 (six) hours as needed.    . nitroGLYCERIN (NITROSTAT) 0.4 MG SL tablet Place 1 tablet (0.4 mg total) under the tongue every 5 (five) minutes as needed for chest pain. (Patient not taking: Reported on 06/23/2019) 12/28/2018: PRN  . nystatin (MYCOSTATIN/NYSTOP) powder Apply topically. 12/19/2017: Uses as needed.   . ondansetron (ZOFRAN-ODT) 8 MG disintegrating tablet Take 1 tablet by mouth 3 (three) times daily as needed for nausea.    Marland Kitchen oxyCODONE (OXY IR/ROXICODONE) 5 MG immediate release tablet Take 1 tablet (5 mg total) by mouth every 6 (six) hours as needed for severe pain. Must last 30 days.   . promethazine (PHENERGAN) 25 MG tablet Take 25 mg by mouth 3 (three) times daily as needed for nausea.  12/28/2018: If Zofran doesn't work  . torsemide (DEMADEX) 20 MG tablet 20 mg once. As needed.    No facility-administered encounter medications on file as of 06/23/2019.    ALLERGIES:  Allergies  Allergen Reactions  . Bee Venom Anaphylaxis  . Erythromycin Anaphylaxis  . Cephalexin   .  Neomycin-Bacitracin-Polymyxin  [Bacitracin-Neomycin-Polymyxin] Swelling    Sight of application  . Neosporin Original [Bacitracin-Neomycin-Polymyxin] Swelling    Swelling at site of application  . Prednisone   . Sulfa Antibiotics Swelling    Sulfa eye drops caused eyes to swell Sulfa eye drops caused eyes to swell  . Tobrex [Tobramycin] Swelling  . Tramadol Hives  . Penicillins Rash    Has patient had a PCN reaction causing immediate rash, facial/tongue/throat swelling, SOB or lightheadedness with hypotension: Yes Has patient had a PCN reaction causing severe rash involving mucus membranes or skin necrosis: No Has patient had a PCN reaction that required hospitalization: No Has patient had a PCN reaction occurring within the last 10 years: No If all of the above answers are "NO", then may proceed with Cephalosporin use.      PHYSICAL EXAM:  ECOG Performance status: 1  Vitals:   06/23/19 0854  BP: 118/74  Pulse: 84  Resp: 20  Temp: (!) 96.9 F (36.1 C)  SpO2: 99%   Filed Weights   06/23/19 0854  Weight: 218 lb 4.8 oz (99 kg)   Physical Exam Constitutional:      Appearance: Normal appearance. She is normal weight.  Cardiovascular:     Rate and Rhythm: Normal rate and regular rhythm.     Heart sounds: Normal heart sounds.  Pulmonary:     Effort: Pulmonary effort is normal.     Breath sounds: Normal breath sounds.  Abdominal:     General: Bowel sounds are normal.     Palpations: Abdomen is soft.  Musculoskeletal:        General: Normal range of motion.  Skin:    General: Skin is warm.  Neurological:     Mental Status: She is alert and oriented to person, place, and time. Mental status is at baseline.  Psychiatric:        Mood and Affect: Mood normal.        Behavior: Behavior normal.        Thought Content: Thought content normal.        Judgment:  Judgment normal.      LABORATORY DATA:  I have reviewed the labs as listed.  CBC    Component Value  Date/Time   WBC 8.6 12/31/2017 1933   RBC 4.63 09/23/2018 1405   RBC 4.70 12/31/2017 1933   HGB 11.8 (L) 12/31/2017 1933   HCT 39.2 12/31/2017 1933   PLT 534 (H) 12/31/2017 1933   MCV 83.4 12/31/2017 1933   MCH 25.1 (L) 12/31/2017 1933   MCHC 30.1 12/31/2017 1933   RDW 15.8 (H) 12/31/2017 1933   LYMPHSABS 2.6 12/31/2017 1933   MONOABS 0.7 12/31/2017 1933   EOSABS 0.0 12/31/2017 1933   BASOSABS 0.0 12/31/2017 1933   CMP Latest Ref Rng & Units 12/31/2017 10/15/2017 09/27/2017  Glucose 70 - 99 mg/dL 130(H) - 99  BUN 6 - 20 mg/dL 12 - 9  Creatinine 0.44 - 1.00 mg/dL 0.76 - 0.61  Sodium 135 - 145 mmol/L 142 - 140  Potassium 3.5 - 5.1 mmol/L 3.5 - 3.5  Chloride 98 - 111 mmol/L 106 - 112(H)  CO2 22 - 32 mmol/L 29 - 22  Calcium 8.9 - 10.3 mg/dL 8.4(L) - 7.7(L)  Total Protein 6.5 - 8.1 g/dL 6.3(L) 5.7(L) -  Total Bilirubin 0.3 - 1.2 mg/dL 0.6 0.3 -  Alkaline Phos 38 - 126 U/L 97 - -  AST 15 - 41 U/L 29 87(H) -  ALT 0 - 44 U/L 50(H) 95(H) -   All questions were answered to patient's stated satisfaction. Encouraged patient to call with any new concerns or questions before his next visit to the cancer center and we can certain see him sooner, if needed.     ASSESSMENT & PLAN:  IDA (iron deficiency anemia) 1.  Microcytic anemia: -She was referred by Dr. Bartolo Darter for labs on 08/06/2018 that showed hemoglobin 11.4 with MCV of 78. -Colonoscopy by Dr. Laural Golden on 09/26/2017 showed normal ileum, 5 nonbleeding polyps in the cecum, in the sigmoid colon, hepatic flexure.  Hemorrhoids. -EGD on 03/29/2010 showed normal duodenal mucosa. -She last received Feraheme once in 2012 and once in 2014.  She was scheduled for 2 Feraheme's and no showed for her appointments. -Work-up was completed which showed serum copper, CRP, methylmalonic acid, SPEP, ESR, and reticulocyte were all normal. -Labs done on 06/10/2019 showed hemoglobin 11.5, ferritin 8, B12 326, folate 4.2, platelets 722. -We will give her 2 infusions of  IV iron. -She will follow-up in 3 months with repeat labs.  2.  Folate/B12 deficiency: -Labs done on 06/10/2019 showed folate 4.2 and B12 326. -She is taking folic acid 1 mg twice daily for many years. -She takes B12 once a month. -Methylmalonic acid was normal.  3.  Thrombocytosis: -Platelet count was elevated at 525.  This could be reactive from underlying iron deficiency. -CT AP on 09/25/2017 showed normal-sized spleen without focal abnormality.  Fatty infiltration of the liver noted.  Stable bilateral adrenal adenomas. -If she continues to have elevated platelet count after iron repletion will consider mutation testing for myeloproliferative disorder. -Her mother has JAK2 positive myeloproliferative neoplasm. -Labs done on 06/10/2019 showed platelet count 722.  She will receive 2 infusions of iron and we will recheck in 3 months.     Orders placed this encounter:  Orders Placed This Encounter  Procedures  . Lactate dehydrogenase  . CBC with Differential/Platelet  . Comprehensive metabolic panel  . Ferritin  . Iron and TIBC  . Vitamin B12  . VITAMIN D 25 Hydroxy (Vit-D Deficiency, Fractures)  . Folate  Sharon Finders, FNP-C [place] [phone]

## 2019-06-23 NOTE — Assessment & Plan Note (Addendum)
1.  Microcytic anemia: -She was referred by Dr. Bartolo Darter for labs on 08/06/2018 that showed hemoglobin 11.4 with MCV of 78. -Colonoscopy by Dr. Laural Golden on 09/26/2017 showed normal ileum, 5 nonbleeding polyps in the cecum, in the sigmoid colon, hepatic flexure.  Hemorrhoids. -EGD on 03/29/2010 showed normal duodenal mucosa. -She last received Feraheme once in 2012 and once in 2014.  She was scheduled for 2 Feraheme's and no showed for her appointments. -Work-up was completed which showed serum copper, CRP, methylmalonic acid, SPEP, ESR, and reticulocyte were all normal. -Labs done on 06/10/2019 showed hemoglobin 11.5, ferritin 8, B12 326, folate 4.2, platelets 722. -We will give her 2 infusions of IV iron. -She will follow-up in 3 months with repeat labs.  2.  Folate/B12 deficiency: -Labs done on 06/10/2019 showed folate 4.2 and B12 326. -She is taking folic acid 1 mg twice daily for many years. -She takes B12 once a month. -Methylmalonic acid was normal.  3.  Thrombocytosis: -Platelet count was elevated at 525.  This could be reactive from underlying iron deficiency. -CT AP on 09/25/2017 showed normal-sized spleen without focal abnormality.  Fatty infiltration of the liver noted.  Stable bilateral adrenal adenomas. -If she continues to have elevated platelet count after iron repletion will consider mutation testing for myeloproliferative disorder. -Her mother has JAK2 positive myeloproliferative neoplasm. -Labs done on 06/10/2019 showed platelet count 722.  She will receive 2 infusions of iron and we will recheck in 3 months.

## 2019-07-01 ENCOUNTER — Other Ambulatory Visit: Payer: Self-pay

## 2019-07-01 ENCOUNTER — Inpatient Hospital Stay (HOSPITAL_COMMUNITY): Payer: BC Managed Care – PPO

## 2019-07-01 ENCOUNTER — Encounter (HOSPITAL_COMMUNITY): Payer: Self-pay

## 2019-07-01 VITALS — BP 114/64 | HR 83 | Temp 98.1°F | Resp 18

## 2019-07-01 DIAGNOSIS — D3502 Benign neoplasm of left adrenal gland: Secondary | ICD-10-CM | POA: Diagnosis not present

## 2019-07-01 DIAGNOSIS — K760 Fatty (change of) liver, not elsewhere classified: Secondary | ICD-10-CM | POA: Diagnosis not present

## 2019-07-01 DIAGNOSIS — D125 Benign neoplasm of sigmoid colon: Secondary | ICD-10-CM | POA: Diagnosis not present

## 2019-07-01 DIAGNOSIS — D3501 Benign neoplasm of right adrenal gland: Secondary | ICD-10-CM | POA: Diagnosis not present

## 2019-07-01 DIAGNOSIS — D509 Iron deficiency anemia, unspecified: Secondary | ICD-10-CM | POA: Diagnosis not present

## 2019-07-01 DIAGNOSIS — D473 Essential (hemorrhagic) thrombocythemia: Secondary | ICD-10-CM | POA: Diagnosis not present

## 2019-07-01 DIAGNOSIS — K649 Unspecified hemorrhoids: Secondary | ICD-10-CM | POA: Diagnosis not present

## 2019-07-01 DIAGNOSIS — D123 Benign neoplasm of transverse colon: Secondary | ICD-10-CM | POA: Diagnosis not present

## 2019-07-01 DIAGNOSIS — Z79899 Other long term (current) drug therapy: Secondary | ICD-10-CM | POA: Diagnosis not present

## 2019-07-01 MED ORDER — SODIUM CHLORIDE 0.9 % IV SOLN
510.0000 mg | Freq: Once | INTRAVENOUS | Status: AC
  Administered 2019-07-01: 510 mg via INTRAVENOUS
  Filled 2019-07-01: qty 510

## 2019-07-01 MED ORDER — SODIUM CHLORIDE 0.9 % IV SOLN: Freq: Once | INTRAVENOUS | Status: AC

## 2019-07-01 NOTE — Progress Notes (Signed)
Iron given per orders. Patient tolerated it well without problems. Vitals stable and discharged home from clinic ambulatory. Follow up as scheduled.

## 2019-07-01 NOTE — Patient Instructions (Signed)
[facility]  Discharge Instructions:   _______________________________________________________________  Thank you for choosing [facility] to provide your oncology and hematology care.  To afford each patient quality time with our providers, please arrive at least 15 minutes before your scheduled appointment.  You need to re-schedule your appointment if you arrive 10 or more minutes late.  We strive to give you quality time with our providers, and arriving late affects you and other patients whose appointments are after yours.  Also, if you no show three or more times for appointments you may be dismissed from the clinic.  Again, thank you for choosing  [facility]. Our hope is that these requests will allow you access to exceptional care and in a timely manner. _______________________________________________________________  If you have questions after your visit, please contact our office at [phone] between the hours of 8:30 a.m. and 5:00 p.m. Voicemails left after 4:30 p.m. will not be returned until the following business day. _______________________________________________________________  For prescription refill requests, have your pharmacy contact our office. _______________________________________________________________  Recommendations made by the consultant and any test results will be sent to your referring physician. _______________________________________________________________ 

## 2019-07-08 ENCOUNTER — Other Ambulatory Visit: Payer: Self-pay

## 2019-07-08 ENCOUNTER — Inpatient Hospital Stay (HOSPITAL_COMMUNITY): Payer: BC Managed Care – PPO | Attending: Nurse Practitioner

## 2019-07-08 ENCOUNTER — Encounter (HOSPITAL_COMMUNITY): Payer: Self-pay

## 2019-07-08 VITALS — BP 115/62 | HR 74 | Temp 97.5°F | Resp 17

## 2019-07-08 DIAGNOSIS — D509 Iron deficiency anemia, unspecified: Secondary | ICD-10-CM | POA: Insufficient documentation

## 2019-07-08 MED ORDER — SODIUM CHLORIDE 0.9 % IV SOLN: Freq: Once | INTRAVENOUS | Status: AC

## 2019-07-08 MED ORDER — SODIUM CHLORIDE 0.9 % IV SOLN
510.0000 mg | Freq: Once | INTRAVENOUS | Status: AC
  Administered 2019-07-08: 510 mg via INTRAVENOUS
  Filled 2019-07-08: qty 510

## 2019-07-08 NOTE — Progress Notes (Unsigned)
Patient presents today for Feraheme infusion. MAR reviewed and updated. Patient has no complaints of any changes since her last visit. Patient experienced body aches after last iron per patient's words.

## 2019-07-09 ENCOUNTER — Encounter: Payer: Self-pay | Admitting: Psychiatry

## 2019-07-09 ENCOUNTER — Telehealth (INDEPENDENT_AMBULATORY_CARE_PROVIDER_SITE_OTHER): Payer: BC Managed Care – PPO | Admitting: Psychiatry

## 2019-07-09 DIAGNOSIS — F5105 Insomnia due to other mental disorder: Secondary | ICD-10-CM

## 2019-07-09 DIAGNOSIS — F410 Panic disorder [episodic paroxysmal anxiety] without agoraphobia: Secondary | ICD-10-CM

## 2019-07-09 DIAGNOSIS — F330 Major depressive disorder, recurrent, mild: Secondary | ICD-10-CM | POA: Diagnosis not present

## 2019-07-09 DIAGNOSIS — F411 Generalized anxiety disorder: Secondary | ICD-10-CM

## 2019-07-09 MED ORDER — BUSPIRONE HCL 7.5 MG PO TABS: 7.5000 mg | ORAL_TABLET | Freq: Two times a day (BID) | ORAL | 1 refills | Status: DC

## 2019-07-09 MED ORDER — DULOXETINE HCL 60 MG PO CPEP: ORAL_CAPSULE | ORAL | 0 refills | Status: DC

## 2019-07-09 MED ORDER — DULOXETINE HCL 20 MG PO CPEP: ORAL_CAPSULE | ORAL | 0 refills | Status: DC

## 2019-07-09 NOTE — Progress Notes (Addendum)
Provider Location : [facility] Patient Location : Work  Virtual Visit via Video Note  I connected with Sharon Lawrence on 07/09/19 at  9:40 AM EDT by a video enabled telemedicine application and verified that I am speaking with the correct person using two identifiers.   I discussed the limitations of evaluation and management by telemedicine and the availability of in person appointments. The patient expressed understanding and agreed to proceed.   I discussed the assessment and treatment plan with the patient. The patient was provided an opportunity to ask questions and all were answered. The patient agreed with the plan and demonstrated an understanding of the instructions.   The patient was advised to call back or seek an in-person evaluation if the symptoms worsen or if the condition fails to improve as anticipated.    [facility] MD OP Progress Note  07/09/2019 12:51 PM Sharon Lawrence  MRN:  [id number]  Chief Complaint:  Chief Complaint    Follow-up     HPI: Sharon Lawrence is a 54 year old Caucasian female, widowed, employed as a Marine scientist, lives in [place], has a history of panic disorder, GAD, MDD, insomnia, coronary artery disease status post STEMI on 09/15/2017, status post stent placement, hyperlipidemia, chronic anemia was evaluated by telemedicine today.  Patient today reports she continues to have psychosocial stressors of her son who is struggling with substance abuse problems.  She reports she has been trying to get him into treatment.  He however has declined any help.  She hence is trying to get him out of her house since this is a constant stressor for her.  Patient reports this has been very anxiety provoking for her.  She currently struggles with her mood as well as her sleep.  She reports she tried the [place] however it does make her groggy.  She reports she has has been trying to use meditation techniques and using melatonin.  She reports she sleeps okay 3 nights a week.  However since her anxiety  symptoms have worsoned her sleep also has been more restless.  Patient denies any suicidality, homicidality or perceptual disturbances.  Patient continues to struggle with her physical problems, recently had iron infusion.  She reports since the infusion yesterday she has been feeling a little bit better.  She reports she has been struggling with chest pain and shortness of breath for over a month now.  Patient denies any other concerns today. Visit Diagnosis:    ICD-10-CM   1. Panic disorder  F41.0 DULoxetine (CYMBALTA) 20 MG capsule    DULoxetine (CYMBALTA) 60 MG capsule    busPIRone (BUSPAR) 7.5 MG tablet  2. GAD (generalized anxiety disorder)  F41.1 DULoxetine (CYMBALTA) 20 MG capsule    DULoxetine (CYMBALTA) 60 MG capsule    busPIRone (BUSPAR) 7.5 MG tablet  3. MDD (major depressive disorder), recurrent episode, mild (HCC)  F33.0 DULoxetine (CYMBALTA) 60 MG capsule    busPIRone (BUSPAR) 7.5 MG tablet  4. Insomnia due to mental disorder  F51.05     Past Psychiatric History: I have reviewed past psychiatric history from my progress note on 11/17/2017.  Past trials of Zoloft, Paxil, trazodone.  Past Medical History:  Past Medical History:  Diagnosis Date  . ADHD (attention deficit hyperactivity disorder)   . Anxiety   . B12 deficiency 02/07/2011  . Back pain   . CAD (coronary artery disease)    a. s/p recent STEMI on 09/15/2017 with DES to RCA  . Celiac disease   . Folic acid deficiency  02/07/2011  . Headache   . Iron deficiency anemia 02/07/2011  . Myocardial infarction ([place]) 09/15/2017    Past Surgical History:  Procedure Laterality Date  . CHOLECYSTECTOMY    . COLONOSCOPY WITH PROPOFOL N/A 09/26/2017   Procedure: COLONOSCOPY WITH PROPOFOL;  Surgeon: Rogene Houston, MD;  Location: [facility];  Service: Endoscopy;  Laterality: N/A;  . CORONARY STENT INTERVENTION Right 09/15/2017   Procedure: CORONARY STENT INTERVENTION;  Surgeon: Jettie Booze, MD;  Location: [place] [facility];  Service: Cardiovascular;  Laterality: Right;  RCA  . KNEE CARTILAGE SURGERY Right   . LEFT HEART CATH AND CORONARY ANGIOGRAPHY N/A 09/15/2017   Procedure: LEFT HEART CATH AND CORONARY ANGIOGRAPHY;  Surgeon: Jettie Booze, MD;  Location: [facility];  Service: Cardiovascular;  Laterality: N/A;  . TONSILLECTOMY      Family Psychiatric History: I have reviewed family psychiatric history from my progress note on 11/17/2017.  Family History:  Family History  Problem Relation Age of Onset  . Cancer Mother   . Hypertension Mother   . Hypertension Father   . Hypothyroidism Father   . Cancer Sister   . Anxiety disorder Sister   . Depression Sister   . CAD Maternal Grandfather   . Cirrhosis Paternal Grandfather   . CAD Paternal Grandmother   . Diabetes Paternal Grandmother   . Hypothyroidism Paternal Grandmother   . Drug abuse Son   . Bipolar disorder Son     Social History: I have reviewed social history from my progress note on 11/17/2017. Social History   Socioeconomic History  . Marital status: Widowed    Spouse name: Not on file  . Number of children: 3  . Years of education: Not on file  . Highest education level: Associate degree: occupational, Hotel manager, or vocational program  Occupational History  . Not on file  Tobacco Use  . Smoking status: Former Smoker    Packs/day: 0.25    Years: 12.00    Pack years: 3.00    Types: Cigarettes    Quit date: 04/04/2017    Years since quitting: 2.2  . Smokeless tobacco: Never Used  Substance and Sexual Activity  . Alcohol use: No  . Drug use: No  . Sexual activity: Not Currently  Other Topics Concern  . Not on file  Social History Narrative  . Not on file   Social Determinants of Health   Financial Resource Strain:   . Difficulty of Paying Living Expenses:   Food Insecurity:   . Worried About Charity fundraiser in the Last Year:   . Arboriculturist in the Last Year:   Transportation Needs:    . Film/video editor (Medical):   Marland Kitchen Lack of Transportation (Non-Medical):   Physical Activity:   . Days of Exercise per Week:   . Minutes of Exercise per Session:   Stress:   . Feeling of Stress :   Social Connections:   . Frequency of Communication with Friends and Family:   . Frequency of Social Gatherings with Friends and Family:   . Attends Religious Services:   . Active Member of Clubs or Organizations:   . Attends Archivist Meetings:   Marland Kitchen Marital Status:     Allergies:  Allergies  Allergen Reactions  . Bee Venom Anaphylaxis  . Erythromycin Anaphylaxis  . Cephalexin   . Neomycin-Bacitracin-Polymyxin  [Bacitracin-Neomycin-Polymyxin] Swelling    Sight of application  . Neosporin Original [Bacitracin-Neomycin-Polymyxin] Swelling  Swelling at site of application  . Prednisone   . Sulfa Antibiotics Swelling    Sulfa eye drops caused eyes to swell Sulfa eye drops caused eyes to swell  . Tobrex [Tobramycin] Swelling  . Tramadol Hives  . Penicillins Rash    Has patient had a PCN reaction causing immediate rash, facial/tongue/throat swelling, SOB or lightheadedness with hypotension: Yes Has patient had a PCN reaction causing severe rash involving mucus membranes or skin necrosis: No Has patient had a PCN reaction that required hospitalization: No Has patient had a PCN reaction occurring within the last 10 years: No If all of the above answers are "NO", then may proceed with Cephalosporin use.     Metabolic Disorder Labs: Lab Results  Component Value Date   HGBA1C 5.6 09/16/2017   MPG 114 09/16/2017   MPG 123 (H) 07/07/2013   No results found for: PROLACTIN Lab Results  Component Value Date   CHOL 77 09/16/2017   TRIG 64 09/16/2017   HDL 19 (L) 09/16/2017   CHOLHDL 4.1 09/16/2017   VLDL 13 09/16/2017   LDLCALC 45 09/16/2017   LDLCALC 97 07/07/2013   Lab Results  Component Value Date   TSH 2.471 07/07/2013   TSH 1.745 06/03/2012     Therapeutic Level Labs: No results found for: LITHIUM No results found for: VALPROATE No components found for:  CBMZ  Current Medications: Current Outpatient Medications  Medication Sig Dispense Refill  . aspirin 81 MG chewable tablet Chew 1 tablet (81 mg total) by mouth daily.    Marland Kitchen atorvastatin (LIPITOR) 40 MG tablet TAKE 1 TABLET BY MOUTH ONCE DAILY AT 6PM. 30 tablet 6  . baclofen (LIORESAL) 20 MG tablet Take 1 tablet (20 mg total) by mouth daily. 30 tablet 5  . busPIRone (BUSPAR) 7.5 MG tablet Take 1 tablet (7.5 mg total) by mouth 2 (two) times daily. 60 tablet 1  . clonazePAM (KLONOPIN) 0.5 MG tablet Take 0.5-1 tablets (0.25-0.5 mg total) by mouth as directed. 1-2 times a week as needed for severe panic sx. 15 tablet 1  . clopidogrel (PLAVIX) 75 MG tablet TAKE 1 TABLET BY MOUTH ONCE DAILY. 30 tablet 6  . Cyanocobalamin (VITAMIN B-12 IJ) Inject as directed every 30 (thirty) days.     Marland Kitchen doxycycline (VIBRAMYCIN) 100 MG capsule Take 100 mg by mouth 2 (two) times daily.    . DULoxetine (CYMBALTA) 20 MG capsule TAKE (1) CAPSULE BY MOUTH ONCE DAILY. *TAKE WITH 60MG CAP* 90 capsule 0  . DULoxetine (CYMBALTA) 60 MG capsule TAKE (1) CAPSULE BY MOUTH ONCE DAILY. 90 capsule 0  . famotidine (PEPCID) 40 MG tablet daily.     . fluticasone (FLONASE) 50 MCG/ACT nasal spray Place 1 spray into both nostrils daily. As needed.    . folic acid (FOLVITE) 1 MG tablet Take 1 mg by mouth 2 (two) times a day.    . ibuprofen (ADVIL,MOTRIN) 800 MG tablet 800 mg every 6 (six) hours as needed.     . lactobacillus acidophilus (BACID) TABS tablet Take 1 tablet by mouth daily.    Marland Kitchen levonorgestrel (MIRENA) 20 MCG/24HR IUD by Intrauterine route.    . metoprolol tartrate (LOPRESSOR) 25 MG tablet Take 1 tablet (25 mg total) by mouth 2 (two) times daily. 180 tablet 3  . nitroGLYCERIN (NITROSTAT) 0.4 MG SL tablet Place 1 tablet (0.4 mg total) under the tongue every 5 (five) minutes as needed for chest pain. 25 tablet 2   . nystatin (MYCOSTATIN/NYSTOP) powder Apply topically.    Marland Kitchen  ondansetron (ZOFRAN-ODT) 8 MG disintegrating tablet Take 1 tablet by mouth 3 (three) times daily as needed for nausea.   3  . oxyCODONE (OXY IR/ROXICODONE) 5 MG immediate release tablet Take 1 tablet (5 mg total) by mouth every 6 (six) hours as needed for severe pain. Must last 30 days. 120 tablet 0  . oxyCODONE (OXY IR/ROXICODONE) 5 MG immediate release tablet Take 1 tablet (5 mg total) by mouth every 6 (six) hours as needed for severe pain. Must last 30 days. 120 tablet 0  . [START ON 07/20/2019] oxyCODONE (OXY IR/ROXICODONE) 5 MG immediate release tablet Take 1 tablet (5 mg total) by mouth every 6 (six) hours as needed for severe pain. Must last 30 days. 120 tablet 0  . pantoprazole (PROTONIX) 40 MG tablet TAKE 1 TABLET BY MOUTH ONCE DAILY BEFORE BREAKFAST. 30 tablet 6  . predniSONE (DELTASONE) 20 MG tablet     . pregabalin (LYRICA) 75 MG capsule Take 1 capsule (75 mg total) by mouth 3 (three) times daily. 90 capsule 5  . promethazine (PHENERGAN) 25 MG tablet Take 25 mg by mouth 3 (three) times daily as needed for nausea.   2  . torsemide (DEMADEX) 20 MG tablet 20 mg once. As needed.  2  . Vitamin D, Ergocalciferol, (DRISDOL) 1.25 MG (50000 UT) CAPS capsule 1 cap weekly 12 weeks, 12 caps     No current facility-administered medications for this visit.     Musculoskeletal: Strength & Muscle Tone: UTA Gait & Station: normal Patient leans: N/A  Psychiatric Specialty Exam: Review of Systems  Respiratory: Positive for chest tightness (improving) and shortness of breath (improving).   Musculoskeletal: Positive for back pain.  Psychiatric/Behavioral: Positive for dysphoric mood and sleep disturbance. Negative for agitation, behavioral problems, confusion, decreased concentration, hallucinations, self-injury and suicidal ideas. The patient is nervous/anxious. The patient is not hyperactive.   All other systems reviewed and are  negative.   There were no vitals taken for this visit.There is no height or weight on file to calculate BMI.  General Appearance: Casual  Eye Contact:  Fair  Speech:  Normal Rate  Volume:  Normal  Mood:  Anxious and Dysphoric  Affect:  Congruent  Thought Process:  Goal Directed and Descriptions of Associations: Intact  Orientation:  Full (Time, Place, and Person)  Thought Content: Logical   Suicidal Thoughts:  No  Homicidal Thoughts:  No  Memory:  Immediate;   Fair Recent;   Fair Remote;   Fair  Judgement:  Fair  Insight:  Fair  Psychomotor Activity:  Normal  Concentration:  Concentration: Fair and Attention Span: Fair  Recall:  [employer] of Knowledge: Fair  Language: Fair  Akathisia:  No  Handed:  Right  AIMS (if indicated): UTA  Assets:  Communication Skills Desire for Improvement Housing Social Support  ADL's:  Intact  Cognition: WNL  Sleep:  restless   Screenings: PHQ2-9     Office Visit from 04/15/2018 in [facility] Clinical Support from 03/18/2018 in [facility] Office Visit from 12/29/2017 in [facility]  PHQ-2 Total Score  0  0  0       Assessment and Plan: Evee is a 54 year old Caucasian female, widowed, lives in [place], has a history of depression, anxiety, insomnia, hyperlipidemia, coronary artery disease, MI status post stent placement was evaluated by telemedicine today.  Patient is biologically predisposed given her family history, her own  health issues.  Patient with psychosocial stressors of her son's substance abuse problems and her own health issues.  Patient continues to struggle with anxiety as well as sleep issues.  Plan as noted below.  Plan Panic disorder-unstable Cymbalta 80 mg p.o. daily Add BuSpar 7.5 mg p.o. twice daily Clonazepam 0.25 to 0.5 mg p.o. as needed 1-2 times a week for severe panic attacks only.   She rarely uses it.  GAD-unstable Cymbalta as prescribed Start BuSpar 7.5 mg p.o. twice daily  MDD-unstable Cymbalta as prescribed BuSpar as summarized above She will also benefit from CBT.  Insomnia-unstable Discussed with patient to increase her melatonin dosage. She will continue to work on sleep hygiene techniques, meditation relaxation techniques.  Provided supportive psychotherapy.  Follow-up in clinic in 4 weeks or sooner if needed.  She will continue to follow-up with her primary care provider and other providers for her chest discomfort.  I have spent atleast 20 minutes non face to face with patient today. More than 50 % of the time was spent for preparing to see the patient ( e.g., review of test, records ), ordering medications and test ,psychoeducation and supportive psychotherapy and care coordination,as well as documenting clinical information in electronic health record. This note was generated in part or whole with voice recognition software. Voice recognition is usually quite accurate but there are transcription errors that can and very often do occur. I apologize for any typographical errors that were not detected and corrected.        Ursula Alert, MD 07/09/2019, 12:51 PM

## 2019-07-12 NOTE — Progress Notes (Signed)
Patient: Sharon Lawrence  Service Category: E/M  Provider: Gaspar Cola, MD  DOB: 12/13/1965  DOS: 07/14/2019  Location: Office  MRN: [id number]  Setting: Ambulatory outpatient  Referring Provider: Vidal Schwalbe, MD  Type: Established Patient  Specialty: Interventional Pain Management  PCP: Vidal Schwalbe, MD  Location: Remote location  Delivery: TeleHealth     Virtual Encounter - Pain Management PROVIDER NOTE: Information contained herein reflects review and annotations entered in association with encounter. Interpretation of such information and data should be left to medically-trained personnel. Information provided to patient can be located elsewhere in the medical record under "Patient Instructions". Document created using STT-dictation technology, any transcriptional errors that may result from process are unintentional.    Contact & Pharmacy Preferred: [phone] Home: [phone] (home) Mobile: [phone] (mobile) E-mail: [email or web address]  [facility], [address] [place] Phone: [phone] Fax: [phone]   Pre-screening  Sharon Lawrence offered "in-person" vs "virtual" encounter. She indicated preferring virtual for this encounter.   Reason COVID-19*  Social distancing based on CDC and AMA recommendations.   I contacted Sharon Lawrence on 07/14/2019 via telephone.      I clearly identified myself as 18/02/2020, MD. I verified that I was speaking with the correct person using two identifiers (Name: Sharon Lawrence, and date of birth: 1965-11-17).  Consent I sought verbal advanced consent from [place] for virtual visit interactions. I informed Sharon Lawrence of possible security and privacy concerns, risks, and limitations associated with providing "not-in-person" medical evaluation and management services. I also informed Sharon Lawrence of the availability of "in-person" appointments. Finally, I informed her that there  would be a charge for the virtual visit and that she could be  personally, fully or partially, financially responsible for it. Sharon Lawrence expressed understanding and agreed to proceed.   Historic Elements   Sharon Lawrence is a 54 y.o. year old, female patient evaluated today after her last contact with our practice on 04/22/2019. Sharon Lawrence  has a past medical history of ADHD (attention deficit hyperactivity disorder), Anxiety, B12 deficiency (02/07/2011), Back pain, CAD (coronary artery disease), Celiac disease, Folic acid deficiency (02/07/2011), Headache, Iron deficiency anemia (02/07/2011), and Myocardial infarction ([place]) (09/15/2017). She also  has a past surgical history that includes Tonsillectomy; Cholecystectomy; Knee cartilage surgery (Right); LEFT HEART CATH AND CORONARY ANGIOGRAPHY (N/A, 09/15/2017); CORONARY STENT INTERVENTION (Right, 09/15/2017); and Colonoscopy with propofol (N/A, 09/26/2017). Sharon Lawrence has a current medication list which includes the following prescription(s): aspirin, atorvastatin, baclofen, [START ON 08/27/2019] baclofen, buspirone, clonazepam, cyanocobalamin, doxycycline, duloxetine, duloxetine, famotidine, fluticasone, folic acid, ibuprofen, lactobacillus acidophilus, levonorgestrel, nitroglycerin, nystatin, ondansetron, [START ON 07/20/2019] oxycodone, [START ON 08/19/2019] oxycodone, [START ON 09/18/2019] oxycodone, [START ON 10/18/2019] oxycodone, pantoprazole, pregabalin, promethazine, torsemide, vitamin d (ergocalciferol), and metoprolol tartrate. She  reports that she quit smoking about 2 years ago. Her smoking use included cigarettes. She has a 3.00 pack-year smoking history. She has never used smokeless tobacco. She reports that she does not drink alcohol or use drugs. Sharon Lawrence is allergic to bee venom; erythromycin; cephalexin; neomycin-bacitracin-polymyxin  [bacitracin-neomycin-polymyxin]; neosporin original [bacitracin-neomycin-polymyxin]; prednisone; sulfa antibiotics; tobrex  [tobramycin]; tramadol; and penicillins.   HPI  Today, she is being contacted for medication management. The patient indicates doing well with the current medication regimen. No adverse reactions or side effects reported to the medications.   Pharmacotherapy Assessment  Analgesic: Oxycodone IR 5 mg every 6 hours (20 mg/day  of oxycodone) MME/day:61m/day.   Monitoring: [place] PMP: PDMP reviewed during this encounter.       Pharmacotherapy: No side-effects or adverse reactions reported. Compliance: No problems identified. Effectiveness: Clinically acceptable. Plan: Refer to "POC".  UDS:  Summary  Date Value Ref Range Status  04/15/2018 FINAL  Final    Comment:    ==================================================================== TOXASSURE SELECT 13 (MW) ==================================================================== Test                             Result       Flag       Units Drug Present and Declared for Prescription Verification   Oxycodone                      1599         EXPECTED   ng/mg creat   Oxymorphone                    1693         EXPECTED   ng/mg creat   Noroxycodone                   1441         EXPECTED   ng/mg creat   Noroxymorphone                 337          EXPECTED   ng/mg creat    Sources of oxycodone are scheduled prescription medications.    Oxymorphone, noroxycodone, and noroxymorphone are expected    metabolites of oxycodone. Oxymorphone is also available as a    scheduled prescription medication. Drug Present not Declared for Prescription Verification   7-aminoclonazepam              196          UNEXPECTED ng/mg creat    7-aminoclonazepam is an expected metabolite of clonazepam. Source    of clonazepam is a scheduled prescription medication. ==================================================================== Test                      Result    Flag   Units      Ref Range   Creatinine              112              mg/dL       >=20 ==================================================================== Declared Medications:  The flagging and interpretation on this report are based on the  following declared medications.  Unexpected results may arise from  inaccuracies in the declared medications.  **Note: The testing scope of this panel includes these medications:  Oxycodone (Oxy-IR)  **Note: The testing scope of this panel does not include following  reported medications:  Fluticasone  Gabapentin (Neurontin)  Levonorgestrel (Mirena)  Metoprolol (Lopressor)  Nitroglycerin (Nitrostat)  Nystatin (Mycostatin)  Ondansetron (Zofran)  Pantoprazole (Protonix)  Promethazine (Phenergan)  Supplement (Probiotic)  Torsemide (Demadex) ==================================================================== For clinical consultation, please call ([phone] ====================================================================    Laboratory Chemistry Profile   Renal Lab Results  Component Value Date   BUN 12 12/31/2017   CREATININE 0.76 12/31/2017   GFRAA >60 12/31/2017   GFRNONAA >60 12/31/2017     Hepatic Lab Results  Component Value Date   AST 29 12/31/2017   ALT 50 (H) 12/31/2017   ALBUMIN 3.4 (L) 12/31/2017   ALKPHOS 97 12/31/2017  Electrolytes Lab Results  Component Value Date   NA 142 12/31/2017   K 3.5 12/31/2017   CL 106 12/31/2017   CALCIUM 8.4 (L) 12/31/2017   MG 1.9 12/03/2017     Bone Lab Results  Component Value Date   VD25OH  07/20/2008    37 (NOTE) This assay accurately quantifies Vitamin D, which is the sum of the 25-Hydroxy forms of Vitamin D2 and D3.  Studies have shown that the optimum concentration of 25-Hydroxy Vitamin D is 30 ng/mL or higher.  Concentrations of Vitamin D between 20  and 29 ng/mL are considered to be insufficient and concentrations less than 20 ng/mL are considered to be deficient for Vitamin D.   [id number] 47 04/25/2009   [id number] 47 04/25/2009    [id number]  04/25/2009    <8 (NOTE) Vitamin D3, 1,25(OH)2 indicates both endogenous production and supplementation.  Vitamin D2, 1,25(OH)2 is an indicator of exogeous sources, such as diet or supplementation.  Interpretation and therapy are based on measurement of Vitamin  D,1,25(OH)2, Total. This test was developed and its performance characteristics have been determined by [facility], [place], [place]. Performance characteristics refer to the analytical performance of the test.     Inflammation (CRP: Acute Phase) (ESR: Chronic Phase) Lab Results  Component Value Date   CRP <0.8 09/23/2018   ESRSEDRATE 7 09/23/2018       Note: Above Lab results reviewed.  Imaging  MR Knee Right Wo Contrast CLINICAL DATA:  Chronic knee pain. History of prior knee arthroscopy.  EXAM: MRI OF THE RIGHT KNEE WITHOUT CONTRAST  TECHNIQUE: Multiplanar, multisequence MR imaging of the knee was performed. No intravenous contrast was administered.  COMPARISON:  None.  FINDINGS: MENISCI  Medial meniscus:  Intact  Lateral meniscus:  Intact  LIGAMENTS  Cruciates:  Intact  Collaterals:  Intact  CARTILAGE  Patellofemoral: Advanced degenerative chondrosis/chondromalacia most notably involving the lateral patellofemoral joint with marked cartilage loss, joint space narrowing and subchondral cystic change. Slight lateral tilt of the patella.  Medial: Moderate to advanced degenerative chondrosis most notably involving the femoral articular cartilage laterally. Suspect full or near full-thickness cartilage defect.  Lateral: Moderate degenerative chondrosis with early joint space narrowing and spurring.  Joint:  Small joint effusion.  Popliteal Fossa:  No popliteal mass or Baker's cyst.  Extensor Mechanism: The patella retinacular structures are intact and the quadriceps and patellar tendons are intact.  Bones:  No acute bony findings.  Other: Normal knee  musculature.  IMPRESSION: 1. Tricompartmental degenerative changes most significant at the lateral patellofemoral joint (grade 4 chondromalacia). 2. Intact ligamentous structures and no acute bony findings. 3. No meniscal tears. 4. Small joint effusion.  Electronically Signed   By: Marijo Sanes M.D.   On: 06/03/2019 08:23  Assessment  The primary encounter diagnosis was Chronic pain syndrome. Diagnoses of Chronic low back pain (Primary Area of Pain) (Bilateral) (R>L) w/ sciatica (Right), Chronic lower extremity pain (Secondary Area of Pain) (Right), Back muscle spasm, Chronic musculoskeletal pain, and Pharmacologic therapy were also pertinent to this visit.  Plan of Care  Problem-specific:  No problem-specific Assessment & Plan notes found for this encounter.  Sharon Lawrence has a current medication list which includes the following long-term medication(s): atorvastatin, baclofen, [START ON 08/27/2019] baclofen, clonazepam, duloxetine, duloxetine, fluticasone, levonorgestrel, nitroglycerin, [START ON 07/20/2019] oxycodone, [START ON 08/19/2019] oxycodone, [START ON 09/18/2019] oxycodone, [START ON 10/18/2019] oxycodone, pantoprazole, pregabalin, promethazine, torsemide, and metoprolol tartrate.  Pharmacotherapy (Medications Ordered): Meds ordered this encounter  Medications  . oxyCODONE (OXY IR/ROXICODONE) 5 MG immediate release tablet    Sig: Take 1 tablet (5 mg total) by mouth every 6 (six) hours as needed for severe pain. Must last 30 days.    Dispense:  120 tablet    Refill:  0    Chronic Pain: STOP Act (Not applicable) Fill 1 day early if closed on refill date. Do not fill until: 08/19/2019. To last until: 09/18/2019. Avoid benzodiazepines within 8 hours of opioids  . oxyCODONE (OXY IR/ROXICODONE) 5 MG immediate release tablet    Sig: Take 1 tablet (5 mg total) by mouth every 6 (six) hours as needed for severe pain. Must last 30 days.    Dispense:  120 tablet    Refill:  0    Chronic  Pain: STOP Act (Not applicable) Fill 1 day early if closed on refill date. Do not fill until: 09/18/2019. To last until: 10/18/2019. Avoid benzodiazepines within 8 hours of opioids  . oxyCODONE (OXY IR/ROXICODONE) 5 MG immediate release tablet    Sig: Take 1 tablet (5 mg total) by mouth every 6 (six) hours as needed for severe pain. Must last 30 days.    Dispense:  120 tablet    Refill:  0    Chronic Pain: STOP Act (Not applicable) Fill 1 day early if closed on refill date. Do not fill until: 10/18/2019. To last until: 11/17/2019. Avoid benzodiazepines within 8 hours of opioids  . baclofen (LIORESAL) 20 MG tablet    Sig: Take 1 tablet (20 mg total) by mouth daily.    Dispense:  30 tablet    Refill:  5    Fill one day early if pharmacy is closed on scheduled refill date. May substitute for generic if available.   Orders:  Orders Placed This Encounter  Procedures  . ToxASSURE Select 13 (MW), Urine    Volume: 30 ml(s). Minimum 3 ml of urine is needed. Document temperature of fresh sample. Indications: Long term (current) use of opiate analgesic (E72.094)    Order Specific Question:   Release to patient    Answer:   Immediate   Follow-up plan:   Return in about 3 months (around 10/25/2019) for (F2F), (MM).      Interventional management options:  Planned, scheduled, and/or pending:    No blocks until after July 15th, 2020 due to MI and blood thinners.   Considering:   Diagnostic bilateral lumbar facet nerve block  Possible bilateral lumbar facet RFA  Diagnostic sacroiliac joint nerve block  Possible sacroiliac joint RFA    PRN Procedures:   None at this time     Recent Visits Date Type Provider Dept  04/26/19 Telemedicine Milinda Pointer, MD [facility]  Showing recent visits within past 90 days and meeting all other requirements   Future Appointments No visits were found meeting these conditions.  Showing future appointments within next 90 days and meeting all other  requirements   I discussed the assessment and treatment plan with the patient. The patient was provided an opportunity to ask questions and all were answered. The patient agreed with the plan and demonstrated an understanding of the instructions.  Patient advised to call back or seek an in-person evaluation if the symptoms or condition worsens.  Duration of encounter: 11 minutes.  Note by: Gaspar Cola, MD Date: 07/14/2019; Time: 12:14 PM

## 2019-07-13 ENCOUNTER — Encounter: Payer: Self-pay | Admitting: Pain Medicine

## 2019-07-14 ENCOUNTER — Telehealth: Payer: Self-pay | Admitting: *Deleted

## 2019-07-14 ENCOUNTER — Ambulatory Visit: Payer: BC Managed Care – PPO | Attending: Pain Medicine | Admitting: Pain Medicine

## 2019-07-14 ENCOUNTER — Other Ambulatory Visit: Payer: Self-pay

## 2019-07-14 DIAGNOSIS — G8929 Other chronic pain: Secondary | ICD-10-CM

## 2019-07-14 DIAGNOSIS — M5441 Lumbago with sciatica, right side: Secondary | ICD-10-CM | POA: Diagnosis not present

## 2019-07-14 DIAGNOSIS — M79604 Pain in right leg: Secondary | ICD-10-CM

## 2019-07-14 DIAGNOSIS — M7918 Myalgia, other site: Secondary | ICD-10-CM

## 2019-07-14 DIAGNOSIS — Z79899 Other long term (current) drug therapy: Secondary | ICD-10-CM

## 2019-07-14 DIAGNOSIS — G894 Chronic pain syndrome: Secondary | ICD-10-CM

## 2019-07-14 DIAGNOSIS — M62830 Muscle spasm of back: Secondary | ICD-10-CM | POA: Diagnosis not present

## 2019-07-14 MED ORDER — OXYCODONE HCL 5 MG PO TABS: 5.0000 mg | ORAL_TABLET | Freq: Four times a day (QID) | ORAL | 0 refills | Status: DC | PRN

## 2019-07-14 MED ORDER — BACLOFEN 20 MG PO TABS: 20.0000 mg | ORAL_TABLET | Freq: Every day | ORAL | 5 refills | Status: DC

## 2019-07-14 NOTE — Telephone Encounter (Signed)
Will you do this? Her VV was today.

## 2019-07-20 ENCOUNTER — Ambulatory Visit: Payer: BC Managed Care – PPO | Admitting: Orthopaedic Surgery

## 2019-07-21 ENCOUNTER — Encounter: Payer: Self-pay | Admitting: Orthopaedic Surgery

## 2019-08-06 ENCOUNTER — Telehealth (INDEPENDENT_AMBULATORY_CARE_PROVIDER_SITE_OTHER): Payer: BC Managed Care – PPO | Admitting: Psychiatry

## 2019-08-06 ENCOUNTER — Encounter: Payer: Self-pay | Admitting: Psychiatry

## 2019-08-06 ENCOUNTER — Other Ambulatory Visit: Payer: Self-pay

## 2019-08-06 DIAGNOSIS — F410 Panic disorder [episodic paroxysmal anxiety] without agoraphobia: Secondary | ICD-10-CM | POA: Diagnosis not present

## 2019-08-06 DIAGNOSIS — F411 Generalized anxiety disorder: Secondary | ICD-10-CM

## 2019-08-06 DIAGNOSIS — F5105 Insomnia due to other mental disorder: Secondary | ICD-10-CM

## 2019-08-06 DIAGNOSIS — F3342 Major depressive disorder, recurrent, in full remission: Secondary | ICD-10-CM | POA: Diagnosis not present

## 2019-08-06 MED ORDER — MIRTAZAPINE 15 MG PO TABS: 7.5000 mg | ORAL_TABLET | Freq: Every evening | ORAL | 1 refills | Status: DC | PRN

## 2019-08-06 MED ORDER — BUSPIRONE HCL 7.5 MG PO TABS: 7.5000 mg | ORAL_TABLET | Freq: Two times a day (BID) | ORAL | 1 refills | Status: DC

## 2019-08-06 NOTE — Progress Notes (Signed)
Provider Location : [facility] Patient Location : Home  Virtual Visit via Video Note  I connected with Sharon Lawrence on 08/06/19 at 10:20 AM EDT by a video enabled telemedicine application and verified that I am speaking with the correct person using two identifiers.   I discussed the limitations of evaluation and management by telemedicine and the availability of in person appointments. The patient expressed understanding and agreed to proceed.   I discussed the assessment and treatment plan with the patient. The patient was provided an opportunity to ask questions and all were answered. The patient agreed with the plan and demonstrated an understanding of the instructions.   The patient was advised to call back or seek an in-person evaluation if the symptoms worsen or if the condition fails to improve as anticipated.   [place] MD OP Progress Note  08/06/2019 12:26 PM Sharon Lawrence  MRN:  [id number]  Chief Complaint:  Chief Complaint    Follow-up     HPI: Sharon Lawrence is a 54 year old Caucasian female, widowed, employed as a Marine scientist, lives in [place], has a history of panic disorder, GAD, MDD, insomnia, coronary artery disease, status post STEMI on 09/15/2017, hyperlipidemia, chronic anemia was evaluated by telemedicine today.  Patient today reports she is currently struggling with psychosocial stressors of her mother's cancer diagnosis.  She reports recent CT scan shows the cancer has returned.  She reports her mother has further work-up scheduled and they are waiting for confirmation.  She reports she does not know what to feel yet and seems to be in denial.  She reports mood wise she is making progress.  She denies any significant anxiety.  The BuSpar is beneficial.  Patient reports sleep continues to be restless.  Last night after she heard the news about her mother she could not sleep at all.  She currently takes melatonin.  That helps to some extent.  Patient denies any suicidality, homicidality or  perceptual disturbances.  She reports she is motivated to restart psychotherapy sessions and will get in touch with Mr. Maye Hides.  Patient denies any other concerns today.  Visit Diagnosis:    ICD-10-CM   1. Panic disorder  F41.0 busPIRone (BUSPAR) 7.5 MG tablet  2. GAD (generalized anxiety disorder)  F41.1 busPIRone (BUSPAR) 7.5 MG tablet  3. MDD (major depressive disorder), recurrent, in full remission ([place])  F33.42 busPIRone (BUSPAR) 7.5 MG tablet  4. Insomnia due to mental disorder  F51.05 mirtazapine (REMERON) 15 MG tablet    Past Psychiatric History: I have reviewed past psychiatric history from my progress note on 11/17/2017.  Past trials of Zoloft, Paxil, trazodone, Lunesta, temazepam  Past Medical History:  Past Medical History:  Diagnosis Date  . ADHD (attention deficit hyperactivity disorder)   . Anxiety   . B12 deficiency 02/07/2011  . Back pain   . CAD (coronary artery disease)    a. s/p recent STEMI on 09/15/2017 with DES to RCA  . Celiac disease   . Folic acid deficiency 99/05/5699  . Headache   . Iron deficiency anemia 02/07/2011  . Myocardial infarction ([place]) 09/15/2017    Past Surgical History:  Procedure Laterality Date  . CHOLECYSTECTOMY    . COLONOSCOPY WITH PROPOFOL N/A 09/26/2017   Procedure: COLONOSCOPY WITH PROPOFOL;  Surgeon: Rogene Houston, MD;  Location: [facility];  Service: Endoscopy;  Laterality: N/A;  . CORONARY STENT INTERVENTION Right 09/15/2017   Procedure: CORONARY STENT INTERVENTION;  Surgeon: Jettie Booze, MD;  Location: [facility]  INVASIVE CV LAB;  Service: Cardiovascular;  Laterality: Right;  RCA  . KNEE CARTILAGE SURGERY Right   . LEFT HEART CATH AND CORONARY ANGIOGRAPHY N/A 09/15/2017   Procedure: LEFT HEART CATH AND CORONARY ANGIOGRAPHY;  Surgeon: Jettie Booze, MD;  Location: [facility] CV LAB;  Service: Cardiovascular;  Laterality: N/A;  . TONSILLECTOMY      Family Psychiatric History: I have reviewed family psychiatric  history from my progress note on 11/17/2017  Family History:  Family History  Problem Relation Age of Onset  . Cancer Mother   . Hypertension Mother   . Hypertension Father   . Hypothyroidism Father   . Cancer Sister   . Anxiety disorder Sister   . Depression Sister   . CAD Maternal Grandfather   . Cirrhosis Paternal Grandfather   . CAD Paternal Grandmother   . Diabetes Paternal Grandmother   . Hypothyroidism Paternal Grandmother   . Drug abuse Son   . Bipolar disorder Son     Social History: Reviewed social history from my progress note on 11/17/2017 Social History   Socioeconomic History  . Marital status: Widowed    Spouse name: Not on file  . Number of children: 3  . Years of education: Not on file  . Highest education level: Associate degree: occupational, Hotel manager, or vocational program  Occupational History  . Not on file  Tobacco Use  . Smoking status: Former Smoker    Packs/day: 0.25    Years: 12.00    Pack years: 3.00    Types: Cigarettes    Quit date: 04/04/2017    Years since quitting: 2.3  . Smokeless tobacco: Never Used  Substance and Sexual Activity  . Alcohol use: No  . Drug use: No  . Sexual activity: Not Currently  Other Topics Concern  . Not on file  Social History Narrative  . Not on file   Social Determinants of Health   Financial Resource Strain:   . Difficulty of Paying Living Expenses:   Food Insecurity:   . Worried About Charity fundraiser in the Last Year:   . Arboriculturist in the Last Year:   Transportation Needs:   . Film/video editor (Medical):   Marland Kitchen Lack of Transportation (Non-Medical):   Physical Activity:   . Days of Exercise per Week:   . Minutes of Exercise per Session:   Stress:   . Feeling of Stress :   Social Connections:   . Frequency of Communication with Friends and Family:   . Frequency of Social Gatherings with Friends and Family:   . Attends Religious Services:   . Active Member of Clubs or  Organizations:   . Attends Archivist Meetings:   Marland Kitchen Marital Status:     Allergies:  Allergies  Allergen Reactions  . Bee Venom Anaphylaxis  . Erythromycin Anaphylaxis  . Cephalexin   . Neomycin-Bacitracin-Polymyxin  [Bacitracin-Neomycin-Polymyxin] Swelling    Sight of application  . Neosporin Original [Bacitracin-Neomycin-Polymyxin] Swelling    Swelling at site of application  . Prednisone   . Sulfa Antibiotics Swelling    Sulfa eye drops caused eyes to swell Sulfa eye drops caused eyes to swell  . Tobrex [Tobramycin] Swelling  . Tramadol Hives  . Penicillins Rash    Has patient had a PCN reaction causing immediate rash, facial/tongue/throat swelling, SOB or lightheadedness with hypotension: Yes Has patient had a PCN reaction causing severe rash involving mucus membranes or skin necrosis: No Has patient had  a PCN reaction that required hospitalization: No Has patient had a PCN reaction occurring within the last 10 years: No If all of the above answers are "NO", then may proceed with Cephalosporin use.     Metabolic Disorder Labs: Lab Results  Component Value Date   HGBA1C 5.6 09/16/2017   MPG 114 09/16/2017   MPG 123 (H) 07/07/2013   No results found for: PROLACTIN Lab Results  Component Value Date   CHOL 77 09/16/2017   TRIG 64 09/16/2017   HDL 19 (L) 09/16/2017   CHOLHDL 4.1 09/16/2017   VLDL 13 09/16/2017   LDLCALC 45 09/16/2017   LDLCALC 97 07/07/2013   Lab Results  Component Value Date   TSH 2.471 07/07/2013   TSH 1.745 06/03/2012    Therapeutic Level Labs: No results found for: LITHIUM No results found for: VALPROATE No components found for:  CBMZ  Current Medications: Current Outpatient Medications  Medication Sig Dispense Refill  . aspirin 81 MG chewable tablet Chew 1 tablet (81 mg total) by mouth daily.    Marland Kitchen atorvastatin (LIPITOR) 40 MG tablet TAKE 1 TABLET BY MOUTH ONCE DAILY AT 6PM. 30 tablet 6  . baclofen (LIORESAL) 20 MG tablet  Take 1 tablet (20 mg total) by mouth daily. 30 tablet 5  . [START ON 08/27/2019] baclofen (LIORESAL) 20 MG tablet Take 1 tablet (20 mg total) by mouth daily. 30 tablet 5  . busPIRone (BUSPAR) 7.5 MG tablet Take 1 tablet (7.5 mg total) by mouth 2 (two) times daily. 60 tablet 1  . clonazePAM (KLONOPIN) 0.5 MG tablet Take 0.5-1 tablets (0.25-0.5 mg total) by mouth as directed. 1-2 times a week as needed for severe panic sx. 15 tablet 1  . Cyanocobalamin (VITAMIN B-12 IJ) Inject as directed every 30 (thirty) days.     Marland Kitchen doxycycline (VIBRAMYCIN) 100 MG capsule Take 100 mg by mouth 2 (two) times daily.    . DULoxetine (CYMBALTA) 20 MG capsule TAKE (1) CAPSULE BY MOUTH ONCE DAILY. *TAKE WITH 60MG CAP* 90 capsule 0  . DULoxetine (CYMBALTA) 60 MG capsule TAKE (1) CAPSULE BY MOUTH ONCE DAILY. 90 capsule 0  . famotidine (PEPCID) 40 MG tablet daily.     . fluticasone (FLONASE) 50 MCG/ACT nasal spray Place 1 spray into both nostrils daily. As needed.    . folic acid (FOLVITE) 1 MG tablet Take 1 mg by mouth 2 (two) times a day.    . ibuprofen (ADVIL,MOTRIN) 800 MG tablet 800 mg every 6 (six) hours as needed.     . lactobacillus acidophilus (BACID) TABS tablet Take 1 tablet by mouth daily.    Marland Kitchen levonorgestrel (MIRENA) 20 MCG/24HR IUD by Intrauterine route.    . metoprolol tartrate (LOPRESSOR) 25 MG tablet Take 1 tablet (25 mg total) by mouth 2 (two) times daily. 180 tablet 3  . mirtazapine (REMERON) 15 MG tablet Take 0.5-1 tablets (7.5-15 mg total) by mouth at bedtime as needed. For sleep 30 tablet 1  . nitroGLYCERIN (NITROSTAT) 0.4 MG SL tablet Place 1 tablet (0.4 mg total) under the tongue every 5 (five) minutes as needed for chest pain. 25 tablet 2  . nystatin (MYCOSTATIN/NYSTOP) powder Apply topically.    . ondansetron (ZOFRAN-ODT) 8 MG disintegrating tablet Take 1 tablet by mouth 3 (three) times daily as needed for nausea.   3  . oxyCODONE (OXY IR/ROXICODONE) 5 MG immediate release tablet Take 1 tablet (5 mg  total) by mouth every 6 (six) hours as needed for severe pain. Must last  30 days. 120 tablet 0  . [START ON 08/19/2019] oxyCODONE (OXY IR/ROXICODONE) 5 MG immediate release tablet Take 1 tablet (5 mg total) by mouth every 6 (six) hours as needed for severe pain. Must last 30 days. 120 tablet 0  . [START ON 09/18/2019] oxyCODONE (OXY IR/ROXICODONE) 5 MG immediate release tablet Take 1 tablet (5 mg total) by mouth every 6 (six) hours as needed for severe pain. Must last 30 days. 120 tablet 0  . [START ON 10/18/2019] oxyCODONE (OXY IR/ROXICODONE) 5 MG immediate release tablet Take 1 tablet (5 mg total) by mouth every 6 (six) hours as needed for severe pain. Must last 30 days. 120 tablet 0  . pantoprazole (PROTONIX) 40 MG tablet TAKE 1 TABLET BY MOUTH ONCE DAILY BEFORE BREAKFAST. 30 tablet 6  . pregabalin (LYRICA) 75 MG capsule Take 1 capsule (75 mg total) by mouth 3 (three) times daily. 90 capsule 5  . promethazine (PHENERGAN) 25 MG tablet Take 25 mg by mouth 3 (three) times daily as needed for nausea.   2  . torsemide (DEMADEX) 20 MG tablet 20 mg once. As needed.  2  . Vitamin D, Ergocalciferol, (DRISDOL) 1.25 MG (50000 UT) CAPS capsule 1 cap weekly 12 weeks, 12 caps     No current facility-administered medications for this visit.     Musculoskeletal: Strength & Muscle Tone: UTA Gait & Station: normal Patient leans: N/A  Psychiatric Specialty Exam: Review of Systems  Psychiatric/Behavioral: Positive for sleep disturbance. Negative for agitation, behavioral problems, confusion, decreased concentration, dysphoric mood, hallucinations and self-injury. The patient is not nervous/anxious and is not hyperactive.   All other systems reviewed and are negative.   There were no vitals taken for this visit.There is no height or weight on file to calculate BMI.  General Appearance: Casual  Eye Contact:  Fair  Speech:  Clear and Coherent  Volume:  Normal  Mood:  Euthymic  Affect:  Congruent  Thought  Process:  Goal Directed and Descriptions of Associations: Intact  Orientation:  Full (Time, Place, and Person)  Thought Content: Logical   Suicidal Thoughts:  No  Homicidal Thoughts:  No  Memory:  Immediate;   Fair Recent;   Fair Remote;   Fair  Judgement:  Fair  Insight:  Fair  Psychomotor Activity:  Normal  Concentration:  Concentration: Fair and Attention Span: Fair  Recall:  [employer] of Knowledge: Fair  Language: Fair  Akathisia:  No  Handed:  Right  AIMS (if indicated):UTA  Assets:  Communication Skills Desire for Improvement Housing Social Support  ADL's:  Intact  Cognition: WNL  Sleep:  Restless   Screenings: PHQ2-9     Office Visit from 04/15/2018 in [facility] Clinical Support from 03/18/2018 in [facility] Office Visit from 12/29/2017 in [facility]  PHQ-2 Total Score  0  0  0       Assessment and Plan: Sharon Lawrence is a 54 year old Caucasian female, widowed, lives in [place], has a history of depression, anxiety, insomnia, hyperlipidemia, coronary artery disease, MI status post stent placement was evaluated by telemedicine today.  Patient is biologically predisposed given her family history, her own health issues.  Patient with psychosocial stressors of her son's substance abuse problems, her health issues and her mother's health problems.  Patient continues to struggle with sleep and will benefit from medication readjustment and psychotherapy sessions.  Plan as noted below.  Plan Panic disorder-improving Cymbalta 80 mg p.o. daily BuSpar 7.5 mg p.o. twice daily Clonazepam 0.25-0.5 mg p.o. as needed 1-2 times a week for severe panic attacks only.  She rarely uses it  GAD-improving Cymbalta as prescribed BuSpar 7.5 mg p.o. twice daily  MDD in remission Cymbalta as prescribed BuSpar 7.5 mg p.o. twice  daily   Insomnia-unstable Add Remeron 7.5 to 15 mg p.o. nightly as needed She also has melatonin available  Discussed with patient to restart psychotherapy sessions, she will reach out to Mr. Maye Hides.  Follow-up in clinic in  6 weeks or sooner if needed.  I have spent atleast 20 minutes non face to face with patient today. More than 50 % of the time was spent for preparing to see the patient ( e.g., review of test, records ), ordering medications and test ,psychoeducation and supportive psychotherapy and care coordination,as well as documenting clinical information in electronic health record. This note was generated in part or whole with voice recognition software. Voice recognition is usually quite accurate but there are transcription errors that can and very often do occur. I apologize for any typographical errors that were not detected and corrected.        Ursula Alert, MD 08/06/2019, 12:26 PM

## 2019-09-16 ENCOUNTER — Inpatient Hospital Stay (HOSPITAL_COMMUNITY): Payer: BC Managed Care – PPO | Attending: Hematology

## 2019-09-16 ENCOUNTER — Other Ambulatory Visit: Payer: Self-pay

## 2019-09-16 ENCOUNTER — Other Ambulatory Visit (HOSPITAL_COMMUNITY): Payer: BC Managed Care – PPO

## 2019-09-16 DIAGNOSIS — D509 Iron deficiency anemia, unspecified: Secondary | ICD-10-CM

## 2019-09-16 DIAGNOSIS — E538 Deficiency of other specified B group vitamins: Secondary | ICD-10-CM | POA: Diagnosis not present

## 2019-09-16 DIAGNOSIS — E559 Vitamin D deficiency, unspecified: Secondary | ICD-10-CM | POA: Insufficient documentation

## 2019-09-16 LAB — COMPREHENSIVE METABOLIC PANEL
ALT: 71 U/L — ABNORMAL HIGH (ref 0–44)
AST: 58 U/L — ABNORMAL HIGH (ref 15–41)
Albumin: 3.7 g/dL (ref 3.5–5.0)
Alkaline Phosphatase: 116 U/L (ref 38–126)
Anion gap: 8 (ref 5–15)
BUN: 18 mg/dL (ref 6–20)
CO2: 26 mmol/L (ref 22–32)
Calcium: 8.4 mg/dL — ABNORMAL LOW (ref 8.9–10.3)
Chloride: 104 mmol/L (ref 98–111)
Creatinine, Ser: 0.55 mg/dL (ref 0.44–1.00)
GFR calc Af Amer: >60 mL/min (ref >60)
GFR calc non Af Amer: >60 mL/min (ref >60)
Glucose, Bld: 97 mg/dL (ref 70–99)
Potassium: 4.2 mmol/L (ref 3.5–5.1)
Sodium: 138 mmol/L (ref 135–145)
Total Bilirubin: 0.3 mg/dL (ref 0.3–1.2)
Total Protein: 6.8 g/dL (ref 6.5–8.1)

## 2019-09-16 LAB — CBC WITH DIFFERENTIAL/PLATELET
Abs Immature Granulocytes: 0.04 10*3/uL (ref 0.00–0.07)
Basophils Absolute: 0.1 10*3/uL (ref 0.0–0.1)
Basophils Relative: 1 %
Eosinophils Absolute: 0.4 10*3/uL (ref 0.0–0.5)
Eosinophils Relative: 4 %
HCT: 42.9 % (ref 36.0–46.0)
Hemoglobin: 13 g/dL (ref 12.0–15.0)
Immature Granulocytes: 0 %
Lymphocytes Relative: 32 %
Lymphs Abs: 3.2 10*3/uL (ref 0.7–4.0)
MCH: 25.3 pg — ABNORMAL LOW (ref 26.0–34.0)
MCHC: 30.3 g/dL (ref 30.0–36.0)
MCV: 83.5 fL (ref 80.0–100.0)
Monocytes Absolute: 0.9 10*3/uL (ref 0.1–1.0)
Monocytes Relative: 9 %
Neutro Abs: 5.4 10*3/uL (ref 1.7–7.7)
Neutrophils Relative %: 54 %
Platelets: 486 10*3/uL — ABNORMAL HIGH (ref 150–400)
RBC: 5.14 MIL/uL — ABNORMAL HIGH (ref 3.87–5.11)
RDW: 22.4 % — ABNORMAL HIGH (ref 11.5–15.5)
WBC: 10 10*3/uL (ref 4.0–10.5)
nRBC: 0 % (ref 0.0–0.2)

## 2019-09-16 LAB — FOLATE: Folate: 5.8 ng/mL — ABNORMAL LOW (ref >5.9)

## 2019-09-16 LAB — LACTATE DEHYDROGENASE: LDH: 167 U/L (ref 98–192)

## 2019-09-16 LAB — VITAMIN B12: Vitamin B-12: 422 pg/mL (ref 180–914)

## 2019-09-16 LAB — IRON AND TIBC
Iron: 59 ug/dL (ref 28–170)
Saturation Ratios: 14 % (ref 10.4–31.8)
TIBC: 436 ug/dL (ref 250–450)
UIBC: 377 ug/dL

## 2019-09-16 LAB — FERRITIN: Ferritin: 53 ng/mL (ref 11–307)

## 2019-09-16 LAB — VITAMIN D 25 HYDROXY (VIT D DEFICIENCY, FRACTURES): Vit D, 25-Hydroxy: 17.25 ng/mL — ABNORMAL LOW (ref 30–100)

## 2019-09-23 ENCOUNTER — Ambulatory Visit (HOSPITAL_COMMUNITY): Payer: BC Managed Care – PPO | Admitting: Nurse Practitioner

## 2019-09-30 ENCOUNTER — Encounter (HOSPITAL_COMMUNITY): Payer: Self-pay | Admitting: Nurse Practitioner

## 2019-09-30 ENCOUNTER — Inpatient Hospital Stay (HOSPITAL_BASED_OUTPATIENT_CLINIC_OR_DEPARTMENT_OTHER): Payer: BC Managed Care – PPO | Admitting: Nurse Practitioner

## 2019-09-30 DIAGNOSIS — D509 Iron deficiency anemia, unspecified: Secondary | ICD-10-CM | POA: Diagnosis not present

## 2019-09-30 DIAGNOSIS — R6889 Other general symptoms and signs: Secondary | ICD-10-CM | POA: Diagnosis not present

## 2019-09-30 NOTE — Progress Notes (Signed)
[facility]:    Sharon Schwalbe, MD [address] [place] [address]   DIAGNOSIS: Iron deficiency anemia  CURRENT THERAPY: Intermittent iron infusions  INTERVAL HISTORY: Sharon Lawrence 54 y.o. female was called for a telephone visit for iron deficiency anemia.  Patient reports she has some fatigue that has returned.  She denies any bright red bleeding per rectum or melena.  She denies any easy bruising or bleeding. Denies any nausea, vomiting, or diarrhea. Denies any new pains. Had not noticed any recent bleeding such as epistaxis, hematuria or hematochezia. Denies recent chest pain on exertion, shortness of breath on minimal exertion, pre-syncopal episodes, or palpitations. Denies any numbness or tingling in hands or feet. Denies any recent fevers, infections, or recent hospitalizations. Patient reports appetite at 100% and energy level at 75%.    Patient Active Problem List   Diagnosis Date Noted  . GAD (generalized anxiety disorder) 10/06/2018  . MDD (major depressive disorder), recurrent episode, mild ([place]) 10/06/2018  . Thrombocytosis ([place]) 09/30/2018  . Microcytic anemia 09/23/2018  . Polyarthralgia 09/09/2018  . Abnormal MRI, lumbar spine (07/16/2016) 12/29/2017  . DDD (degenerative disc disease), lumbar 12/29/2017  . DDD (degenerative disc disease), thoracic 12/29/2017  . Lumbar facet arthropathy (Bilateral) 12/29/2017  . Lumbar facet syndrome (Bilateral) 12/29/2017  . Long term current use of anticoagulant therapy (Plavix) 12/29/2017  . Neurogenic pain 12/29/2017  . Chronic musculoskeletal pain 12/29/2017  . Chronic low back pain (Primary Area of Pain) (Bilateral) (R>L) w/ sciatica (Right) 12/03/2017  . Chronic lower extremity pain (Secondary Area of Pain) (Right) 12/03/2017  . Chronic sacroiliac joint pain (Bilateral) (L>R) 12/03/2017  . Chronic pain syndrome 12/03/2017  . Long term current use of opiate analgesic 12/03/2017  .  Pharmacologic therapy 12/03/2017  . Disorder of skeletal system 12/03/2017  . Problems influencing health status 12/03/2017  . Hypothyroid 11/06/2017  . History of ST elevation myocardial infarction (STEMI) 11/06/2017  . GI bleed 11/06/2017  . GERD without esophagitis 11/06/2017  . Bleeding per rectum 11/06/2017  . Bilateral lower extremity edema 11/06/2017  . Benign essential hypertension 11/06/2017  . Back muscle spasm 11/06/2017  . Panic disorder 11/06/2017  . Anemia 11/06/2017  . ADD (attention deficit disorder) 11/06/2017  . Syncope 11/06/2017  . Sessile colonic polyp 11/06/2017  . Right-sided low back pain with sciatica 11/06/2017  . Refused influenza vaccine 11/06/2017  . RAD (reactive airway disease), moderate persistent, uncomplicated 42/68/3419  . Other chronic pain 11/06/2017  . Migraine 11/06/2017  . Major depressive disorder 11/06/2017  . Insomnia due to mental disorder 11/06/2017  . IDA (iron deficiency anemia) 11/06/2017  . Hematochezia 09/25/2017  . STEMI (ST elevation myocardial infarction) ([place]), 09/15/17 09/25/2017  . Hypotension 09/25/2017  . Morbidly obese ([place]) 09/25/2017  . Elevated LFTs 09/25/2017  . Chronic diarrhea, with Celiac disease 09/25/2017  . Former smoker   . Hyperlipidemia 09/17/2017  . Acute ST elevation myocardial infarction (STEMI) of inferior wall ([place]) 09/15/2017  . Family hx of colon cancer 09/11/2017  . Non-intractable vomiting with nausea 09/11/2017  . Celiac disease 09/11/2017  . Vaginal condyloma 06/22/2013  . Obesity 06/22/2013  . Fatty liver disease, nonalcoholic 62/22/9798  . Folic acid deficiency 92/01/9416  . Iron deficiency 02/07/2011  . B12 deficiency 02/07/2011    is allergic to bee venom, erythromycin, cephalexin, neomycin-bacitracin-polymyxin  [bacitracin-neomycin-polymyxin], neosporin original [bacitracin-neomycin-polymyxin], prednisone, sulfa antibiotics, tobrex [tobramycin], tramadol, and penicillins.  MEDICAL  HISTORY: Past Medical History:  Diagnosis Date  .  ADHD (attention deficit hyperactivity disorder)   . Anxiety   . B12 deficiency 02/07/2011  . Back pain   . CAD (coronary artery disease)    a. s/p recent STEMI on 09/15/2017 with DES to RCA  . Celiac disease   . Folic acid deficiency 85/08/3147  . Headache   . Iron deficiency anemia 02/07/2011  . Myocardial infarction ([place]) 09/15/2017    SURGICAL HISTORY: Past Surgical History:  Procedure Laterality Date  . CHOLECYSTECTOMY    . COLONOSCOPY WITH PROPOFOL N/A 09/26/2017   Procedure: COLONOSCOPY WITH PROPOFOL;  Surgeon: Rogene Houston, MD;  Location: [facility];  Service: Endoscopy;  Laterality: N/A;  . CORONARY STENT INTERVENTION Right 09/15/2017   Procedure: CORONARY STENT INTERVENTION;  Surgeon: Jettie Booze, MD;  Location: [facility];  Service: Cardiovascular;  Laterality: Right;  RCA  . KNEE CARTILAGE SURGERY Right   . LEFT HEART CATH AND CORONARY ANGIOGRAPHY N/A 09/15/2017   Procedure: LEFT HEART CATH AND CORONARY ANGIOGRAPHY;  Surgeon: Jettie Booze, MD;  Location: [facility];  Service: Cardiovascular;  Laterality: N/A;  . TONSILLECTOMY      SOCIAL HISTORY: Social History   Socioeconomic History  . Marital status: Widowed    Spouse name: Not on file  . Number of children: 3  . Years of education: Not on file  . Highest education level: Associate degree: occupational, Hotel manager, or vocational program  Occupational History  . Not on file  Tobacco Use  . Smoking status: Former Smoker    Packs/day: 0.25    Years: 12.00    Pack years: 3.00    Types: Cigarettes    Quit date: 04/04/2017    Years since quitting: 2.4  . Smokeless tobacco: Never Used  Vaping Use  . Vaping Use: Never used  Substance and Sexual Activity  . Alcohol use: No  . Drug use: No  . Sexual activity: Not Currently  Other Topics Concern  . Not on file  Social History Narrative  . Not on file   Social Determinants of  Health   Financial Resource Strain:   . Difficulty of Paying Living Expenses:   Food Insecurity:   . Worried About Charity fundraiser in the Last Year:   . Arboriculturist in the Last Year:   Transportation Needs:   . Film/video editor (Medical):   Marland Kitchen Lack of Transportation (Non-Medical):   Physical Activity:   . Days of Exercise per Week:   . Minutes of Exercise per Session:   Stress:   . Feeling of Stress :   Social Connections:   . Frequency of Communication with Friends and Family:   . Frequency of Social Gatherings with Friends and Family:   . Attends Religious Services:   . Active Member of Clubs or Organizations:   . Attends Archivist Meetings:   Marland Kitchen Marital Status:   Intimate Partner Violence:   . Fear of Current or Ex-Partner:   . Emotionally Abused:   Marland Kitchen Physically Abused:   . Sexually Abused:     FAMILY HISTORY: Family History  Problem Relation Age of Onset  . Cancer Mother   . Hypertension Mother   . Hypertension Father   . Hypothyroidism Father   . Cancer Sister   . Anxiety disorder Sister   . Depression Sister   . CAD Maternal Grandfather   . Cirrhosis Paternal Grandfather   . CAD Paternal Grandmother   . Diabetes Paternal Grandmother   .  Hypothyroidism Paternal Grandmother   . Drug abuse Son   . Bipolar disorder Son     Review of Systems  Constitutional: Positive for fatigue.  All other systems reviewed and are negative.    Labs: -Deferred due to telephone visit  Physical Exam -Deferred to telephone visit -Patient was alert and oriented over the phone in no acute distress next  LABORATORY DATA:  CBC    Component Value Date/Time   WBC 10.0 09/16/2019 1507   RBC 5.14 (H) 09/16/2019 1507   HGB 13.0 09/16/2019 1507   HCT 42.9 09/16/2019 1507   PLT 486 (H) 09/16/2019 1507   MCV 83.5 09/16/2019 1507   MCH 25.3 (L) 09/16/2019 1507   MCHC 30.3 09/16/2019 1507   RDW 22.4 (H) 09/16/2019 1507   LYMPHSABS 3.2 09/16/2019 1507    MONOABS 0.9 09/16/2019 1507   EOSABS 0.4 09/16/2019 1507   BASOSABS 0.1 09/16/2019 1507    CMP     Component Value Date/Time   NA 138 09/16/2019 1507   K 4.2 09/16/2019 1507   CL 104 09/16/2019 1507   CO2 26 09/16/2019 1507   GLUCOSE 97 09/16/2019 1507   BUN 18 09/16/2019 1507   CREATININE 0.55 09/16/2019 1507   CREATININE 0.62 07/07/2013 1038   CALCIUM 8.4 (L) 09/16/2019 1507   PROT 6.8 09/16/2019 1507   ALBUMIN 3.7 09/16/2019 1507   AST 58 (H) 09/16/2019 1507   ALT 71 (H) 09/16/2019 1507   ALKPHOS 116 09/16/2019 1507   BILITOT 0.3 09/16/2019 1507   GFRNONAA >60 09/16/2019 1507   GFRAA >60 09/16/2019 1507    All questions were answered to patient's stated satisfaction. Encouraged patient to call with any new concerns or questions before his next visit to the cancer center and we can certain see him sooner, if needed.     ASSESSMENT and THERAPY PLAN:   IDA (iron deficiency anemia) 1.  Microcytic anemia: -She was referred by Dr. Bartolo Darter for labs on 08/06/2018 that showed hemoglobin 11.4 with MCV of 78. -Colonoscopy by Dr. Laural Golden on 09/26/2017 showed normal ileum, 5 nonbleeding polyps in the cecum, in the sigmoid colon, hepatic flexure.  Hemorrhoids. -EGD on 03/29/2010 showed normal duodenal mucosa. -She last received Feraheme once in 2012 and once in 2014.  She was scheduled for 2 Feraheme's and no showed for her appointments. -Work-up was completed which showed serum copper, CRP, methylmalonic acid, SPEP, ESR, and reticulocyte were all normal. -Labs done on 09/16/2019 showed hemoglobin 13.0, ferritin 53, percent saturation 14, platelets 46 -We will hold off on IV iron infusions at this time. -She will follow-up in 3 months with repeat labs.  2.  Folate/B12 deficiency: -Labs done on 09/16/2019 showed folate 5.8 and B12 422 -She is taking folic acid 1 mg twice daily for many years. -She takes B12 once a month. -Methylmalonic acid was normal.  3.  Thrombocytosis: -Platelet  count was elevated at 525.  This could be reactive from underlying iron deficiency. -CT [facility] on 09/25/2017 showed normal-sized spleen without focal abnormality.  Fatty infiltration of the liver noted.  Stable bilateral adrenal adenomas. -If she continues to have elevated platelet count after iron repletion will consider mutation testing for myeloproliferative disorder. -Her mother has JAK2 positive myeloproliferative neoplasm. -Labs done on 09/16/2019 showed platelet 486  4.  Elevated liver enzymes: -Labs done on 09/16/2019 showed AST 58 and ALT 71 -Patient reports her liver enzymes have been elevated since starting simvastatin.  Her PCP decreased her dose from 80 mg  to 40 mg -She has a recheck in September 2021  5.  Vitamin D deficiency: -Labs done on 09/16/2019 showed vitamin D level 17.25 -She is already taking 50,000 units weekly. -We will recheck her next visit.   Orders Placed This Encounter  Procedures  . CBC with Differential/Platelet    Standing Status:   Future    Standing Expiration Date:   09/29/2020  . Comprehensive metabolic panel    Standing Status:   Future    Standing Expiration Date:   09/29/2020  . Ferritin    Standing Status:   Future    Standing Expiration Date:   09/29/2020  . Iron and TIBC    Standing Status:   Future    Standing Expiration Date:   09/29/2020  . Folate    Standing Status:   Future    Standing Expiration Date:   09/29/2020  . Lactate dehydrogenase    Standing Status:   Future    Standing Expiration Date:   09/29/2020  . Vitamin B12    Standing Status:   Future    Standing Expiration Date:   09/29/2020  . VITAMIN D 25 Hydroxy (Vit-D Deficiency, Fractures)    Standing Status:   Future    Standing Expiration Date:   09/29/2020    All questions were answered. The patient knows to call the clinic with any problems, questions or concerns. We can certainly see the patient much sooner if necessary. This note was electronically signed.  I provided 30  minutes of non face-to-face telephone visit time during this encounter, and > 50% was spent counseling as documented under my assessment & plan.   BRIELLE MORO, NP-C 09/30/2019

## 2019-09-30 NOTE — Assessment & Plan Note (Signed)
1.  Microcytic anemia: -She was referred by Dr. Kikel for labs on 08/06/2018 that showed hemoglobin 11.4 with MCV of 78. -Colonoscopy by Dr. Rehman on 09/26/2017 showed normal ileum, 5 nonbleeding polyps in the cecum, in the sigmoid colon, hepatic flexure.  Hemorrhoids. -EGD on 03/29/2010 showed normal duodenal mucosa. -She last received Feraheme once in 2012 and once in 2014.  She was scheduled for 2 Feraheme's and no showed for her appointments. -Work-up was completed which showed serum copper, CRP, methylmalonic acid, SPEP, ESR, and reticulocyte were all normal. -Labs done on 09/16/2019 showed hemoglobin 13.0, ferritin 53, percent saturation 14, platelets 46 -We will hold off on IV iron infusions at this time. -She will follow-up in 3 months with repeat labs.  2.  Folate/B12 deficiency: -Labs done on 09/16/2019 showed folate 5.8 and B12 422 -She is taking folic acid 1 mg twice daily for many years. -She takes B12 once a month. -Methylmalonic acid was normal.  3.  Thrombocytosis: -Platelet count was elevated at 525.  This could be reactive from underlying iron deficiency. -CT AP on 09/25/2017 showed normal-sized spleen without focal abnormality.  Fatty infiltration of the liver noted.  Stable bilateral adrenal adenomas. -If she continues to have elevated platelet count after iron repletion will consider mutation testing for myeloproliferative disorder. -Her mother has JAK2 positive myeloproliferative neoplasm. -Labs done on 09/16/2019 showed platelet 486  4.  Elevated liver enzymes: -Labs done on 09/16/2019 showed AST 58 and ALT 71 -Patient reports her liver enzymes have been elevated since starting simvastatin.  Her PCP decreased her dose from 80 mg to 40 mg -She has a recheck in September 2021  5.  Vitamin D deficiency: -Labs done on 09/16/2019 showed vitamin D level 17.25 -She is already taking 50,000 units weekly. -We will recheck her next visit. 

## 2019-10-08 ENCOUNTER — Telehealth: Payer: BC Managed Care – PPO | Admitting: Psychiatry

## 2019-10-14 DIAGNOSIS — M5136 Other intervertebral disc degeneration, lumbar region: Secondary | ICD-10-CM | POA: Diagnosis not present

## 2019-10-14 DIAGNOSIS — M47816 Spondylosis without myelopathy or radiculopathy, lumbar region: Secondary | ICD-10-CM | POA: Diagnosis not present

## 2019-10-14 DIAGNOSIS — G894 Chronic pain syndrome: Secondary | ICD-10-CM | POA: Diagnosis not present

## 2019-10-14 DIAGNOSIS — M5134 Other intervertebral disc degeneration, thoracic region: Secondary | ICD-10-CM | POA: Diagnosis not present

## 2019-10-17 NOTE — Patient Instructions (Signed)
____________________________________________________________________________________________  Drug Holidays (Slow)  What is a "Drug Holiday"? Drug Holiday: is the name given to the period of time during which a patient stops taking a medication(s) for the purpose of eliminating tolerance to the drug.  Benefits . Improved effectiveness of opioids. . Decreased opioid dose needed to achieve benefits. . Improved pain with lesser dose.  What is tolerance? Tolerance: is the progressive decreased in effectiveness of a drug due to its repetitive use. With repetitive use, the body gets use to the medication and as a consequence, it loses its effectiveness. This is a common problem seen with opioid pain medications. As a result, a larger dose of the drug is needed to achieve the same effect that used to be obtained with a smaller dose.  How long should a "Drug Holiday" last? You should stay off of the pain medicine for at least 14 consecutive days. (2 weeks)  Should I stop the medicine "cold [place]"? No. You should always coordinate with your Pain Specialist so that he/she can provide you with the correct medication dose to make the transition as smoothly as possible.  How do I stop the medicine? Slowly. You will be instructed to decrease the daily amount of pills that you take by one (1) pill every seven (7) days. This is called a "slow downward taper" of your dose. For example: if you normally take four (4) pills per day, you will be asked to drop this dose to three (3) pills per day for seven (7) days, then to two (2) pills per day for seven (7) days, then to one (1) per day for seven (7) days, and at the end of those last seven (7) days, this is when the "Drug Holiday" would start.   Will I have withdrawals? By doing a "slow downward taper" like this one, it is unlikely that you will experience any significant withdrawal symptoms. Typically, what triggers withdrawals is the sudden stop of a high  dose opioid therapy. Withdrawals can usually be avoided by slowly decreasing the dose over a prolonged period of time. If you do not follow these instructions and decide to stop your medication abruptly, withdrawals may be possible.  What are withdrawals? Withdrawals: refers to the wide range of symptoms that occur after stopping or dramatically reducing opiate drugs after heavy and prolonged use. Withdrawal symptoms do not occur to patients that use low dose opioids, or those who take the medication sporadically. Contrary to benzodiazepine (example: Valium, Xanax, etc.) or alcohol withdrawals ("Delirium Tremens"), opioid withdrawals are not lethal. Withdrawals are the physical manifestation of the body getting rid of the excess receptors.  Expected Symptoms Early symptoms of withdrawal may include: . Agitation . Anxiety . Muscle aches . Increased tearing . Insomnia . Runny nose . Sweating . Yawning  Late symptoms of withdrawal may include: . Abdominal cramping . Diarrhea . Dilated pupils . Goose bumps . Nausea . Vomiting  Will I experience withdrawals? Due to the slow nature of the taper, it is very unlikely that you will experience any.  What is a slow taper? Taper: refers to the gradual decrease in dose.  (Last update: 09/22/2019) ____________________________________________________________________________________________    ____________________________________________________________________________________________  Medication Rules  Purpose: To inform patients, and their family members, of our rules and regulations.  Applies to: All patients receiving prescriptions (written or electronic).  Pharmacy of record: Pharmacy where electronic prescriptions will be sent. If written prescriptions are taken to a different pharmacy, please inform the nursing staff. The pharmacy  listed in the electronic medical record should be the one where you would like electronic prescriptions  to be sent.  Electronic prescriptions: In compliance with the [place] (STOP) Act of 2017 (Session Lanny Cramp [phone]), effective March 04, 2018, all controlled substances must be electronically prescribed. Calling prescriptions to the pharmacy will cease to exist.  Prescription refills: Only during scheduled appointments. Applies to all prescriptions.  NOTE: The following applies primarily to controlled substances (Opioid* Pain Medications).   Type of encounter (visit): For patients receiving controlled substances, face-to-face visits are required. (Not an option or up to the patient.)  Patient's responsibilities: 1. Pain Pills: Bring all pain pills to every appointment (except for procedure appointments). 2. Pill Bottles: Bring pills in original pharmacy bottle. Always bring the newest bottle. Bring bottle, even if empty. 3. Medication refills: You are responsible for knowing and keeping track of what medications you take and those you need refilled. The day before your appointment: write a list of all prescriptions that need to be refilled. The day of the appointment: give the list to the admitting nurse. Prescriptions will be written only during appointments. No prescriptions will be written on procedure days. If you forget a medication: it will not be "Called in", "Faxed", or "electronically sent". You will need to get another appointment to get these prescribed. No early refills. Do not call asking to have your prescription filled early. 4. Prescription Accuracy: You are responsible for carefully inspecting your prescriptions before leaving our office. Have the discharge nurse carefully go over each prescription with you, before taking them home. Make sure that your name is accurately spelled, that your address is correct. Check the name and dose of your medication to make sure it is accurate. Check the number of pills, and the written instructions to  make sure they are clear and accurate. Make sure that you are given enough medication to last until your next medication refill appointment. 5. Taking Medication: Take medication as prescribed. When it comes to controlled substances, taking less pills or less frequently than prescribed is permitted and encouraged. Never take more pills than instructed. Never take medication more frequently than prescribed.  6. Inform other Doctors: Always inform, all of your healthcare providers, of all the medications you take. 7. Pain Medication from other Providers: You are not allowed to accept any additional pain medication from any other Doctor or Healthcare provider. There are two exceptions to this rule. (see below) In the event that you require additional pain medication, you are responsible for notifying us, as stated below. 8. Medication Agreement: You are responsible for carefully reading and following our Medication Agreement. This must be signed before receiving any prescriptions from our practice. Safely store a copy of your signed Agreement. Violations to the Agreement will result in no further prescriptions. (Additional copies of our Medication Agreement are available upon request.) 9. Laws, Rules, & Regulations: All patients are expected to follow all Federal and [employer], [employer], Rules, [employer] [facility]. Ignorance of the Laws does not constitute a valid excuse.  10. Illegal drugs and Controlled Substances: The use of illegal substances (including, but not limited to marijuana and its derivatives) and/or the illegal use of any controlled substances is strictly prohibited. Violation of this rule may result in the immediate and permanent discontinuation of any and all prescriptions being written by our practice. The use of any illegal substances is prohibited. 11. Adopted CDC guidelines & recommendations: Target dosing levels will be at or  below 60 MME/day. Use of benzodiazepines** is not  recommended.  Exceptions: There are only two exceptions to the rule of not receiving pain medications from other Healthcare Providers. 1. Exception #1 (Emergencies): In the event of an emergency (i.e.: accident requiring emergency care), you are allowed to receive additional pain medication. However, you are responsible for: As soon as you are able, call our office [phone], at any time of the day or night, and leave a message stating your name, the date and nature of the emergency, and the name and dose of the medication prescribed. In the event that your call is answered by a member of our staff, make sure to document and save the date, time, and the name of the person that took your information.  2. Exception #2 (Planned Surgery): In the event that you are scheduled by another doctor or dentist to have any type of surgery or procedure, you are allowed (for a period no longer than 30 days), to receive additional pain medication, for the acute post-op pain. However, in this case, you are responsible for picking up a copy of our "Post-op Pain Management for Surgeons" handout, and giving it to your surgeon or dentist. This document is available at our office, and does not require an appointment to obtain it. Simply go to our office during business hours (Monday-Thursday from 8:00 AM to 4:00 PM) (Friday 8:00 AM to 12:00 Noon) or if you have a scheduled appointment with [place], prior to your surgery, and ask for it by name. In addition, you will need to provide [place] with your name, name of your surgeon, type of surgery, and date of procedure or surgery.  *Opioid medications include: morphine, codeine, oxycodone, oxymorphone, hydrocodone, hydromorphone, meperidine, tramadol, tapentadol, buprenorphine, fentanyl, methadone. **Benzodiazepine medications include: diazepam (Valium), alprazolam (Xanax), clonazepam (Klonopine), lorazepam (Ativan), clorazepate (Tranxene), chlordiazepoxide (Librium), estazolam (Prosom),  oxazepam (Serax), temazepam (Restoril), triazolam (Halcion) (Last updated: 05/01/2017) ____________________________________________________________________________________________   ____________________________________________________________________________________________  Medication Recommendations and Reminders  Applies to: All patients receiving prescriptions (written and/or electronic).  Medication Rules & Regulations: These rules and regulations exist for your safety and that of others. They are not flexible and neither are we. Dismissing or ignoring them will be considered "non-compliance" with medication therapy, resulting in complete and irreversible termination of such therapy. (See document titled "Medication Rules" for more details.) In all conscience, because of safety reasons, we cannot continue providing a therapy where the patient does not follow instructions.  Pharmacy of record:   Definition: This is the pharmacy where your electronic prescriptions will be sent.   We do not endorse any particular pharmacy, however, we have experienced problems with [facility] not securing enough medication supply for the community.  We do not restrict you in your choice of pharmacy. However, once we write for your prescriptions, we will NOT be re-sending more prescriptions to fix restricted supply problems created by your pharmacy, or your insurance.   The pharmacy listed in the electronic medical record should be the one where you want electronic prescriptions to be sent.  If you choose to change pharmacy, simply notify our nursing staff.  Recommendations:  Keep all of your pain medications in a safe place, under lock and key, even if you live alone. We will NOT replace lost, stolen, or damaged medication.  After you fill your prescription, take 1 week's worth of pills and put them away in a safe place. You should keep a separate, properly labeled bottle for this purpose. The remainder  should be kept in the original bottle. Use this as your primary supply, until it runs out. Once it's gone, then you know that you have 1 week's worth of medicine, and it is time to come in for a prescription refill. If you do this correctly, it is unlikely that you will ever run out of medicine.  To make sure that the above recommendation works, it is very important that you make sure your medication refill appointments are scheduled at least 1 week before you run out of medicine. To do this in an effective manner, make sure that you do not leave the office without scheduling your next medication management appointment. Always ask the nursing staff to show you in your prescription , when your medication will be running out. Then arrange for the receptionist to get you a return appointment, at least 7 days before you run out of medicine. Do not wait until you have 1 or 2 pills left, to come in. This is very poor planning and does not take into consideration that we may need to cancel appointments due to bad weather, sickness, or emergencies affecting our staff.  DO NOT ACCEPT A "Partial Fill": If for any reason your pharmacy does not have enough pills/tablets to completely fill or refill your prescription, do not allow for a "partial fill". The law allows the pharmacy to complete that prescription within 72 hours, without requiring a new prescription. If they do not fill the rest of your prescription within those 72 hours, you will need a separate prescription to fill the remaining amount, which we will NOT provide. If the reason for the partial fill is your insurance, you will need to talk to the pharmacist about payment alternatives for the remaining tablets, but again, DO NOT ACCEPT A PARTIAL FILL, unless you can trust your pharmacist to obtain the remainder of the pills within 72 hours.  Prescription refills and/or changes in medication(s):   Prescription refills, and/or changes in dose or medication,  will be conducted only during scheduled medication management appointments. (Applies to both, written and electronic prescriptions.)  No refills on procedure days. No medication will be changed or started on procedure days. No changes, adjustments, and/or refills will be conducted on a procedure day. Doing so will interfere with the diagnostic portion of the procedure.  No phone refills. No medications will be "called into the pharmacy".  No Fax refills.  No weekend refills.  No Holliday refills.  No after hours refills.  Remember:  Business hours are:  Monday to Thursday 8:00 AM to 4:00 PM Provider's Schedule: Milinda Pointer, MD - Appointments are:  Medication management: Monday and Wednesday 8:00 AM to 4:00 PM Procedure day: Tuesday and Thursday 7:30 AM to 4:00 PM Gillis Santa, MD - Appointments are:  Medication management: Tuesday and Thursday 8:00 AM to 4:00 PM Procedure day: Monday and Wednesday 7:30 AM to 4:00 PM (Last update: 09/22/2019) ____________________________________________________________________________________________   ____________________________________________________________________________________________  CBD (cannabidiol) WARNING  Applicable to: All individuals currently taking or considering taking CBD (cannabidiol) and, more important, all patients taking opioid analgesic controlled substances (pain medication). (Example: oxycodone; oxymorphone; hydrocodone; hydromorphone; morphine; methadone; tramadol; tapentadol; fentanyl; buprenorphine; butorphanol; dextromethorphan; meperidine; codeine; etc.)  Legal status: CBD remains a Schedule I drug prohibited for any use. CBD is illegal with one exception. In the [place], CBD has a limited Transport planner (FDA) approval for the treatment of two specific types of epilepsy disorders. Only one CBD product has been approved by the  FDA for this purpose: "Epidiolex". FDA is aware that some  companies are marketing products containing cannabis and cannabis-derived compounds in ways that violate the [employer], Drug and Cosmetic Act [facility] Act) and that may put the health and safety of consumers at risk. The FDA, a Federal agency, has not enforced the CBD status since 2018.   Legality: Some manufacturers ship CBD products nationally, which is illegal. Often such products are sold online and are therefore available throughout the country. CBD is openly sold in head shops and health food stores in some states where such sales have not been explicitly legalized. Selling unapproved products with unsubstantiated therapeutic claims is not only a violation of the law, but also can put patients at risk, as these products have not been proven to be safe or effective. Federal illegality makes it difficult to conduct research on CBD.  Reference: "FDA Regulation of Cannabis and Cannabis-Derived Products, Including Cannabidiol (CBD)" - [employer]  Warning: CBD is not FDA approved and has not undergo the same manufacturing controls as prescription drugs.  This means that the purity and safety of available CBD may be questionable. Most of the time, despite manufacturer's claims, it is contaminated with THC (delta-9-tetrahydrocannabinol - the chemical in marijuana responsible for the "HIGH").  When this is the case, the [facility] contaminant will trigger a positive urine drug screen (UDS) test for Marijuana (carboxy-THC). Because a positive UDS for any illicit substance is a violation of our medication agreement, your opioid analgesics (pain medicine) may be permanently discontinued.  MORE ABOUT CBD  General Information: CBD  is a derivative of the Marijuana (cannabis sativa) plant discovered in 24. It is one of the 113 identified substances found in Marijuana. It accounts for up to 40% of the  plant's extract. As of 2018, preliminary clinical studies on CBD included research for the treatment of anxiety, movement disorders, and pain. CBD is available and consumed in multiple forms, including inhalation of smoke or vapor, as an aerosol spray, and by mouth. It may be supplied as an oil containing CBD, capsules, dried cannabis, or as a liquid solution. CBD is thought not to be as psychoactive as THC (delta-9-tetrahydrocannabinol - the chemical in marijuana responsible for the "HIGH"). Studies suggest that CBD may interact with different biological target receptors in the body, including cannabinoid and other neurotransmitter receptors. As of 2018 the mechanism of action for its biological effects has not been determined.  Side-effects  Adverse reactions: Dry mouth, diarrhea, decreased appetite, fatigue, drowsiness, malaise, weakness, sleep disturbances, and others.  Drug interactions: CBC may interact with other medications such as blood-thinners. (Last update: 10/09/2019) ____________________________________________________________________________________________

## 2019-10-17 NOTE — Progress Notes (Signed)
PROVIDER NOTE: Information contained herein reflects review and annotations entered in association with encounter. Interpretation of such information and data should be left to medically-trained personnel. Information provided to patient can be located elsewhere in the medical record under "Patient Instructions". Document created using STT-dictation technology, any transcriptional errors that may result from process are unintentional.    Patient: Sharon Lawrence  Service Category: E/M  Provider: Gaspar Cola, MD  DOB: January 12, 1966  DOS: 10/18/2019  Specialty: Interventional Pain Management  MRN: [id number]  Setting: Ambulatory outpatient  PCP: Vidal Schwalbe, MD  Type: Established Patient    Referring Provider: Vidal Schwalbe, MD  Location: Office  Delivery: Face-to-face     HPI  Reason for encounter: Sharon Lawrence, a 54 y.o. year old female, is here today for evaluation and management of her Chronic pain syndrome [G89.4]. Sharon Lawrence primary complain today is Back Pain (low) and Leg Pain (right and posterior to knee) Last encounter: Practice (07/14/2019). My last encounter with her was on Visit date not found. Pertinent problems: Sharon Lawrence has Bilateral lower extremity edema; Back muscle spasm; Right-sided low back pain with sciatica; Other chronic pain; Migraine; Chronic low back pain (Primary Area of Pain) (Bilateral) (R>L) w/ sciatica (Right); Chronic lower extremity pain (Secondary Area of Pain) (Right); Chronic sacroiliac joint pain (Bilateral) (L>R); Chronic pain syndrome; Abnormal MRI, lumbar spine (07/16/2016); DDD (degenerative disc disease), lumbar; DDD (degenerative disc disease), thoracic; Lumbar facet arthropathy (Bilateral); Lumbar facet syndrome (Bilateral); Neurogenic pain; Chronic musculoskeletal pain; and Polyarthralgia on their pertinent problem list. Pain Assessment: Severity of Chronic pain is reported as a 8 /10. Location: Back Lower/right leg. Onset: More than a month ago. Quality:  Aching, Burning, Constant, Pins and needles (stinging). Timing: Constant. Modifying factor(s): medications, heat, moving. Vitals:  height is 5' (1.524 m) and weight is 212 lb (96.2 kg).    The patient indicates doing well with the current medication regimen. No adverse reactions or side effects reported to the medications. No procedures since initial evaluation.  Currently on oxycodone IR 5 mg every 6 hours (20 mg/day of oxycodone) (30 MME) due to medication shortage I will be switching the patient to MS Contin 15 mg 1 tablet p.o. twice daily (30 mg/day of morphine) (30 MME/day).  I will follow-up in 1 month and do UDS at that time.  If no problems, will transfer to Dr. Holley Raring.  Transfer: Baclofen (Lioresal) 20 mg tablet, 1 tablet p.o. daily (30/month) (02/23/2020); pregabalin (Lyrica) 75 mg capsule, 1 tablet p.o. 3 times daily (90/month) (01/23/2020)  Prescription for morphine ER sent to pharmacy.  Patient informed of transfer of the baclofen and Lyrica to PCP.  I will have the patient return in 1 month to evaluate her new medication and if she is not having any problems, then we will provide her with enough prescriptions to last for 90 days, will do a UDS, and we will transfer to Dr. Holley Raring.  Today I spent some time with the patient talking about her weight and the need to bring it down.  She had some questions about the fibromyalgia diagnosis, all of which I have answered for her.  At this point I believe that what will help the patient with the most is to bring her BMI down to 30 or less.  Hopefully will get the assistance of the patient's PCP in doing this.  I have instructed the patient to consider the possibility of a medically supervised weight reduction diet and/for bariatric surgery.  Because  of her coronary artery disease, she is probably not a very good candidate for the surgery, but she would highly benefit from an appropriate diet.  Pharmacotherapy Assessment   Analgesic: Oxycodone IR 5 mg  every 6 hours (20 mg/day of oxycodone) MME/day:23m/day.   Monitoring: [place] PMP: PDMP reviewed during this encounter.       Pharmacotherapy: No side-effects or adverse reactions reported. Compliance: No problems identified. Effectiveness: Clinically acceptable.  SHart Rochester RN  10/18/2019 12:51 PM  Sign when Signing Visit Nursing Pain Medication Assessment:  Safety precautions to be maintained throughout the outpatient stay will include: orient to surroundings, keep bed in low position, maintain call bell within reach at all times, provide assistance with transfer out of bed and ambulation.  Medication Inspection Compliance: Pill count conducted under aseptic conditions, in front of the patient. Neither the pills nor the bottle was removed from the patient's sight at any time. Once count was completed pills were immediately returned to the patient in their original bottle.  Medication: Oxycodone IR Pill/Patch Count: 40 of 120 pills remain Pill/Patch Appearance: Markings consistent with prescribed medication Bottle Appearance: Standard pharmacy container. Clearly labeled. Filled Date: 07 / 22 / 2021 Last Medication intake:  Today    UDS:  Summary  Date Value Ref Range Status  04/15/2018 FINAL  Final    Comment:    ==================================================================== TOXASSURE SELECT 13 (MW) ==================================================================== Test                             Result       Flag       Units Drug Present and Declared for Prescription Verification   Oxycodone                      1599         EXPECTED   ng/mg creat   Oxymorphone                    1693         EXPECTED   ng/mg creat   Noroxycodone                   1441         EXPECTED   ng/mg creat   Noroxymorphone                 337          EXPECTED   ng/mg creat    Sources of oxycodone are scheduled prescription medications.    Oxymorphone, noroxycodone, and noroxymorphone  are expected    metabolites of oxycodone. Oxymorphone is also available as a    scheduled prescription medication. Drug Present not Declared for Prescription Verification   7-aminoclonazepam              196          UNEXPECTED ng/mg creat    7-aminoclonazepam is an expected metabolite of clonazepam. Source    of clonazepam is a scheduled prescription medication. ==================================================================== Test                      Result    Flag   Units      Ref Range   Creatinine              112              mg/dL      >=  20 ==================================================================== Declared Medications:  The flagging and interpretation on this report are based on the  following declared medications.  Unexpected results may arise from  inaccuracies in the declared medications.  **Note: The testing scope of this panel includes these medications:  Oxycodone (Oxy-IR)  **Note: The testing scope of this panel does not include following  reported medications:  Fluticasone  Gabapentin (Neurontin)  Levonorgestrel (Mirena)  Metoprolol (Lopressor)  Nitroglycerin (Nitrostat)  Nystatin (Mycostatin)  Ondansetron (Zofran)  Pantoprazole (Protonix)  Promethazine (Phenergan)  Supplement (Probiotic)  Torsemide (Demadex) ==================================================================== For clinical consultation, please call [phone]. ====================================================================      ROS  Constitutional: Denies any fever or chills Gastrointestinal: No reported hemesis, hematochezia, vomiting, or acute GI distress Musculoskeletal: Denies any acute onset joint swelling, redness, loss of ROM, or weakness Neurological: No reported episodes of acute onset apraxia, aphasia, dysarthria, agnosia, amnesia, paralysis, loss of coordination, or loss of consciousness  Medication Review  Cyanocobalamin, DULoxetine, Vitamin D  (Ergocalciferol), aspirin, atorvastatin, baclofen, busPIRone, clonazePAM, doxycycline, famotidine, fluticasone, folic acid, ibuprofen, lactobacillus acidophilus, levonorgestrel, metoprolol tartrate, mirtazapine, morphine, nitroGLYCERIN, nystatin, ondansetron, oxyCODONE, pantoprazole, pregabalin, promethazine, and torsemide  History Review  Allergy: Sharon Lawrence is allergic to bee venom, erythromycin, cephalexin, neomycin-bacitracin-polymyxin  [bacitracin-neomycin-polymyxin], neosporin original [bacitracin-neomycin-polymyxin], prednisone, sulfa antibiotics, tobrex [tobramycin], tramadol, and penicillins. Drug: Sharon Lawrence  reports no history of drug use. Alcohol:  reports no history of alcohol use. Tobacco:  reports that she quit smoking about 2 years ago. Her smoking use included cigarettes. She has a 3.00 pack-year smoking history. She has never used smokeless tobacco. Social: Sharon Lawrence  reports that she quit smoking about 2 years ago. Her smoking use included cigarettes. She has a 3.00 pack-year smoking history. She has never used smokeless tobacco. She reports that she does not drink alcohol and does not use drugs. Medical:  has a past medical history of ADHD (attention deficit hyperactivity disorder), Anxiety, B12 deficiency (02/07/2011), Back pain, CAD (coronary artery disease), Celiac disease, Folic acid deficiency (02/07/2011), Headache, Iron deficiency anemia (02/07/2011), and Myocardial infarction ([place]) (09/15/2017). Surgical: Sharon Lawrence  has a past surgical history that includes Tonsillectomy; Cholecystectomy; Knee cartilage surgery (Right); LEFT HEART CATH AND CORONARY ANGIOGRAPHY (N/A, 09/15/2017); CORONARY STENT INTERVENTION (Right, 09/15/2017); and Colonoscopy with propofol (N/A, 09/26/2017). Family: family history includes Anxiety disorder in her sister; Bipolar disorder in her son; CAD in her maternal grandfather and paternal grandmother; Cancer in her mother and sister; Cirrhosis in her paternal  grandfather; Depression in her sister; Diabetes in her paternal grandmother; Drug abuse in her son; Hypertension in her father and mother; Hypothyroidism in her father and paternal grandmother.  Laboratory Chemistry Profile   Renal Lab Results  Component Value Date   BUN 18 09/16/2019   CREATININE 0.55 09/16/2019   GFRAA >60 09/16/2019   GFRNONAA >60 09/16/2019     Hepatic Lab Results  Component Value Date   AST 58 (H) 09/16/2019   ALT 71 (H) 09/16/2019   ALBUMIN 3.7 09/16/2019   ALKPHOS 116 09/16/2019     Electrolytes Lab Results  Component Value Date   NA 138 09/16/2019   K 4.2 09/16/2019   CL 104 09/16/2019   CALCIUM 8.4 (L) 09/16/2019   MG 1.9 12/03/2017     Bone Lab Results  Component Value Date   VD25OH 17.25 (L) 09/16/2019   [id number] 47 04/25/2009   [id number] 47 04/25/2009   [id number]  04/25/2009    <8 (NOTE) Vitamin D3, 1,25(OH)2 indicates both endogenous production and supplementation.  Vitamin D2, 1,25(OH)2 is an indicator of exogeous sources, such as diet or supplementation.  Interpretation and therapy are based on measurement of Vitamin  D,1,25(OH)2, Total. This test was developed and its performance characteristics have been determined by [facility], [place], [place]. Performance characteristics refer to the analytical performance of the test.     Inflammation (CRP: Acute Phase) (ESR: Chronic Phase) Lab Results  Component Value Date   CRP <0.8 09/23/2018   ESRSEDRATE 7 09/23/2018       Note: Above Lab results reviewed.  Recent Imaging Review  MR Knee Right Wo Contrast CLINICAL DATA:  Chronic knee pain. History of prior knee arthroscopy.  EXAM: MRI OF THE RIGHT KNEE WITHOUT CONTRAST  TECHNIQUE: Multiplanar, multisequence MR imaging of the knee was performed. No intravenous contrast was administered.  COMPARISON:  None.  FINDINGS: MENISCI  Medial meniscus:  Intact  Lateral meniscus:   Intact  LIGAMENTS  Cruciates:  Intact  Collaterals:  Intact  CARTILAGE  Patellofemoral: Advanced degenerative chondrosis/chondromalacia most notably involving the lateral patellofemoral joint with marked cartilage loss, joint space narrowing and subchondral cystic change. Slight lateral tilt of the patella.  Medial: Moderate to advanced degenerative chondrosis most notably involving the femoral articular cartilage laterally. Suspect full or near full-thickness cartilage defect.  Lateral: Moderate degenerative chondrosis with early joint space narrowing and spurring.  Joint:  Small joint effusion.  Popliteal Fossa:  No popliteal mass or Baker's cyst.  Extensor Mechanism: The patella retinacular structures are intact and the quadriceps and patellar tendons are intact.  Bones:  No acute bony findings.  Other: Normal knee musculature.  IMPRESSION: 1. Tricompartmental degenerative changes most significant at the lateral patellofemoral joint (grade 4 chondromalacia). 2. Intact ligamentous structures and no acute bony findings. 3. No meniscal tears. 4. Small joint effusion.  Electronically Signed   By: Marijo Sanes M.D.   On: 06/03/2019 08:23 Note: Reviewed        Physical Exam  General appearance: Well nourished, well developed, and well hydrated. In no apparent acute distress Mental status: Alert, oriented x 3 (person, place, & time)       Respiratory: No evidence of acute respiratory distress Eyes: PERLA Vitals: Ht 5' (1.524 m)   Wt 212 lb (96.2 kg)   BMI 41.40 kg/m  BMI: Estimated body mass index is 41.4 kg/m as calculated from the following:   Height as of this encounter: 5' (1.524 m).   Weight as of this encounter: 212 lb (96.2 kg). Ideal: Ideal body weight: 45.5 kg (100 lb 4.9 oz) Adjusted ideal body weight: 65.8 kg (144 lb 15.8 oz)  Assessment   Status Diagnosis  Controlled Controlled Controlled 1. Chronic pain syndrome   2. Chronic low back pain  (Primary Area of Pain) (Bilateral) (R>L) w/ sciatica (Right)   3. Chronic lower extremity pain (Secondary Area of Pain) (Right)   4. Pharmacologic therapy   5. Neurogenic pain      Updated Problems: No problems updated.  Plan of Care  Problem-specific:  No problem-specific Assessment & Plan notes found for this encounter.  Sharon Lawrence has a current medication list which includes the following long-term medication(s): atorvastatin, baclofen, duloxetine, duloxetine, fluticasone, levonorgestrel, metoprolol tartrate, nitroglycerin, oxycodone, pantoprazole, promethazine, torsemide, clonazepam, mirtazapine, morphine, and [START ON 10/25/2019] pregabalin.  Pharmacotherapy (Medications Ordered): Meds ordered this encounter  Medications  . pregabalin (LYRICA) 75 MG capsule    Sig: Take 1 capsule (75 mg total) by mouth 3 (three) times daily.  Dispense:  90 capsule    Refill:  2    Fill one day early if pharmacy is closed on scheduled refill date. May substitute for generic if available.  . morphine (MS CONTIN) 15 MG 12 hr tablet    Sig: Take 1 tablet (15 mg total) by mouth every 12 (twelve) hours. Must last 30 days. Do not break tablet    Dispense:  60 tablet    Refill:  0    Chronic Pain: STOP Act (Not applicable) Fill 1 day early if closed on refill date. Do not fill until: 10/18/2019. To last until: 11/17/2019. Avoid benzodiazepines within 8 hours of opioids   Orders:  No orders of the defined types were placed in this encounter.  Follow-up plan:   Return in about 30 days (around 11/17/2019) for (20-min), (F2F), (Med Mgmt) to evaluate change to morphine ER and do UDS..      Interventional management options:  Planned, scheduled, and/or pending:    No blocks until after July 15th, 2020 due to MI and blood thinners.   Considering:   Diagnostic bilateral lumbar facet nerve block  Possible bilateral lumbar facet RFA  Diagnostic sacroiliac joint nerve block  Possible sacroiliac joint  RFA    PRN Procedures:   None at this time      Recent Visits No visits were found meeting these conditions. Showing recent visits within past 90 days and meeting all other requirements Today's Visits Date Type Provider Dept  10/18/19 Office Visit Milinda Pointer, MD [facility]  Showing today's visits and meeting all other requirements Future Appointments No visits were found meeting these conditions. Showing future appointments within next 90 days and meeting all other requirements  I discussed the assessment and treatment plan with the patient. The patient was provided an opportunity to ask questions and all were answered. The patient agreed with the plan and demonstrated an understanding of the instructions.  Patient advised to call back or seek an in-person evaluation if the symptoms or condition worsens.  Duration of encounter: 30 minutes.  Note by: Gaspar Cola, MD Date: 10/18/2019; Time: 1:16 PM

## 2019-10-18 ENCOUNTER — Encounter: Payer: Self-pay | Admitting: Pain Medicine

## 2019-10-18 ENCOUNTER — Ambulatory Visit: Payer: BC Managed Care – PPO | Attending: Pain Medicine | Admitting: Pain Medicine

## 2019-10-18 ENCOUNTER — Other Ambulatory Visit: Payer: Self-pay

## 2019-10-18 VITALS — Ht 60.0 in | Wt 212.0 lb

## 2019-10-18 DIAGNOSIS — M79604 Pain in right leg: Secondary | ICD-10-CM | POA: Diagnosis not present

## 2019-10-18 DIAGNOSIS — M792 Neuralgia and neuritis, unspecified: Secondary | ICD-10-CM | POA: Diagnosis not present

## 2019-10-18 DIAGNOSIS — G894 Chronic pain syndrome: Secondary | ICD-10-CM | POA: Insufficient documentation

## 2019-10-18 DIAGNOSIS — M5441 Lumbago with sciatica, right side: Secondary | ICD-10-CM | POA: Diagnosis not present

## 2019-10-18 DIAGNOSIS — Z79899 Other long term (current) drug therapy: Secondary | ICD-10-CM | POA: Insufficient documentation

## 2019-10-18 DIAGNOSIS — G8929 Other chronic pain: Secondary | ICD-10-CM | POA: Diagnosis not present

## 2019-10-18 LAB — TOXASSURE SELECT 13 (MW), URINE

## 2019-10-18 MED ORDER — PREGABALIN 75 MG PO CAPS: 75.0000 mg | ORAL_CAPSULE | Freq: Three times a day (TID) | ORAL | 2 refills | Status: DC

## 2019-10-18 MED ORDER — MORPHINE SULFATE ER 15 MG PO TBCR: 15.0000 mg | EXTENDED_RELEASE_TABLET | Freq: Two times a day (BID) | ORAL | 0 refills | Status: DC

## 2019-10-18 NOTE — Progress Notes (Signed)
Nursing Pain Medication Assessment:  Safety precautions to be maintained throughout the outpatient stay will include: orient to surroundings, keep bed in low position, maintain call bell within reach at all times, provide assistance with transfer out of bed and ambulation.  Medication Inspection Compliance: Pill count conducted under aseptic conditions, in front of the patient. Neither the pills nor the bottle was removed from the patient's sight at any time. Once count was completed pills were immediately returned to the patient in their original bottle.  Medication: Oxycodone IR Pill/Patch Count: 40 of 120 pills remain Pill/Patch Appearance: Markings consistent with prescribed medication Bottle Appearance: Standard pharmacy container. Clearly labeled. Filled Date: 07 / 22 / 2021 Last Medication intake:  Today

## 2019-10-25 ENCOUNTER — Ambulatory Visit: Payer: BC Managed Care – PPO | Admitting: Pain Medicine

## 2019-10-28 ENCOUNTER — Telehealth: Payer: Self-pay

## 2019-10-28 DIAGNOSIS — F330 Major depressive disorder, recurrent, mild: Secondary | ICD-10-CM

## 2019-10-28 DIAGNOSIS — F410 Panic disorder [episodic paroxysmal anxiety] without agoraphobia: Secondary | ICD-10-CM

## 2019-10-28 DIAGNOSIS — F411 Generalized anxiety disorder: Secondary | ICD-10-CM

## 2019-10-28 NOTE — Telephone Encounter (Signed)
pt called states she needs refills on bother of the cymbalta 20 and 60 mg

## 2019-10-29 MED ORDER — DULOXETINE HCL 60 MG PO CPEP: ORAL_CAPSULE | ORAL | 0 refills | Status: DC

## 2019-10-29 MED ORDER — DULOXETINE HCL 20 MG PO CPEP: ORAL_CAPSULE | ORAL | 0 refills | Status: DC

## 2019-10-29 NOTE — Telephone Encounter (Signed)
Sent Cymbalta to pharmacy.

## 2019-11-02 ENCOUNTER — Encounter: Payer: Self-pay | Admitting: Pain Medicine

## 2019-11-11 ENCOUNTER — Other Ambulatory Visit: Payer: Self-pay

## 2019-11-11 ENCOUNTER — Encounter: Payer: Self-pay | Admitting: Pain Medicine

## 2019-11-11 ENCOUNTER — Ambulatory Visit: Payer: BC Managed Care – PPO | Attending: Pain Medicine | Admitting: Pain Medicine

## 2019-11-11 VITALS — BP 108/73 | HR 92 | Temp 97.7°F | Resp 16 | Ht 60.0 in | Wt 212.0 lb

## 2019-11-11 DIAGNOSIS — Z79899 Other long term (current) drug therapy: Secondary | ICD-10-CM | POA: Diagnosis not present

## 2019-11-11 DIAGNOSIS — G8929 Other chronic pain: Secondary | ICD-10-CM | POA: Diagnosis not present

## 2019-11-11 DIAGNOSIS — G894 Chronic pain syndrome: Secondary | ICD-10-CM | POA: Diagnosis not present

## 2019-11-11 DIAGNOSIS — M5441 Lumbago with sciatica, right side: Secondary | ICD-10-CM | POA: Insufficient documentation

## 2019-11-11 DIAGNOSIS — M47816 Spondylosis without myelopathy or radiculopathy, lumbar region: Secondary | ICD-10-CM | POA: Diagnosis not present

## 2019-11-11 MED ORDER — MORPHINE SULFATE ER 15 MG PO TBCR: 15.0000 mg | EXTENDED_RELEASE_TABLET | Freq: Two times a day (BID) | ORAL | 0 refills | Status: DC

## 2019-11-11 NOTE — Progress Notes (Signed)
PROVIDER NOTE: Information contained herein reflects review and annotations entered in association with encounter. Interpretation of such information and data should be left to medically-trained personnel. Information provided to patient can be located elsewhere in the medical record under "Patient Instructions". Document created using STT-dictation technology, any transcriptional errors that may result from process are unintentional.    Patient: Sharon Lawrence  Service Category: E/M  Provider: Gaspar Cola, MD  DOB: 1965-10-23  DOS: 11/11/2019  Specialty: Interventional Pain Management  MRN: [id number]  Setting: Ambulatory outpatient  PCP: Vidal Schwalbe, MD  Type: Established Patient    Referring Provider: Vidal Schwalbe, MD  Location: Office  Delivery: Face-to-face     HPI  Reason for encounter: Sharon Lawrence, a 54 y.o. year old female, is here today for evaluation and management of her Chronic bilateral low back pain with right-sided sciatica [M54.41, G89.29]. Sharon Lawrence primary complain today is Back Pain (lower) Last encounter: Practice (10/18/2019). My last encounter with her was on 10/18/2019. Pertinent problems: Sharon Lawrence has Bilateral lower extremity edema; Back muscle spasm; Right-sided low back pain with sciatica; Other chronic pain; Migraine; Chronic low back pain (Primary Area of Pain) (Bilateral) (R>L) w/ sciatica (Right); Chronic lower extremity pain (Secondary Area of Pain) (Right); Chronic sacroiliac joint pain (Bilateral) (L>R); Chronic pain syndrome; Abnormal MRI, lumbar spine (07/16/2016); DDD (degenerative disc disease), lumbar; DDD (degenerative disc disease), thoracic; Lumbar facet arthropathy (Bilateral); Lumbar facet syndrome (Bilateral); Neurogenic pain; Chronic musculoskeletal pain; and Polyarthralgia on their pertinent problem list. Pain Assessment: Severity of Chronic pain is reported as a 7 /10. Location: Back Lower/right hip. Onset: More than a month ago. Quality: Aching,  Burning. Timing: Constant. Modifying factor(s): heat, pain medication. Vitals:  height is 5' (1.524 m) and weight is 212 lb (96.2 kg). Her temporal temperature is 97.7 F (36.5 C). Her blood pressure is 108/73 and her pulse is 92. Her respiration is 16 and oxygen saturation is 100%.   The patient comes into the clinics today for follow-up evaluation after having been switched from the oxycodone IR 5 mg every 6 hours (20 mg/day of oxycodone) (30 MME) to morphine ER 15 mg every 12 hours (30 mg/day of morphine) (30 MME).  The patient indicates not having any problems with the medication and the medication working well to control the pain, at least during part of the day and during the weekends.  However, she still having quite a bit of low back pain.  This past month she had her Plavix stopped, which she had been put on secondary to the acute MI that she had on 09/15/2017.  To avoid any problems we had decided that we would not be doing any procedures until after 09/16/2018.  This apparently lasted a little longer since she Had to stay on the Plavix until last month.  At this point she is no longer on any blood thinners and therefore we will be scheduling her for her first diagnostic bilateral lumbar facet block under fluoroscopic guidance and IV sedation.  Unfortunately, I have also noticed that she has gained quite a bit of weight and currently her BMI is 41.40 kg/m.  We will need to have the patient work on bringing her BMI to 30 or less, but at least to less than 35 so that we can consider the possibility of having a successful radiofrequency ablation.  Pharmacotherapy Assessment   Analgesic: Oxycodone IR 5 mg every 6 hours (20 mg/day of oxycodone) MME/day:28m/day.   Monitoring: [place] PMP:  PDMP reviewed during this encounter.       Pharmacotherapy: No side-effects or adverse reactions reported. Compliance: No problems identified. Effectiveness: Clinically acceptable.  Landis Martins, RN  11/11/2019   2:16 PM  Sign when Signing Visit Nursing Pain Medication Assessment:  Safety precautions to be maintained throughout the outpatient stay will include: orient to surroundings, keep bed in low position, maintain call bell within reach at all times, provide assistance with transfer out of bed and ambulation.  Medication Inspection Compliance: Pill count conducted under aseptic conditions, in front of the patient. Neither the pills nor the bottle was removed from the patient's sight at any time. Once count was completed pills were immediately returned to the patient in their original bottle.  Medication: Morphine IR Pill/Patch Count: 21 of 60 pills remain Pill/Patch Appearance: Markings consistent with prescribed medication Bottle Appearance: Standard pharmacy container. Clearly labeled. Filled Date:10/21/2019 Last Medication intake:  Today    UDS:  Summary  Date Value Ref Range Status  10/14/2019 Note  Final    Comment:    ==================================================================== [employer] Select 13 (MW) ==================================================================== Test                             Result       Flag       Units  Drug Present and Declared for Prescription Verification   Oxycodone                      98           EXPECTED   ng/mg creat   Oxymorphone                    1484         EXPECTED   ng/mg creat   Noroxycodone                   678          EXPECTED   ng/mg creat   Noroxymorphone                 258          EXPECTED   ng/mg creat    Sources of oxycodone are scheduled prescription medications.    Oxymorphone, noroxycodone, and noroxymorphone are expected    metabolites of oxycodone. Oxymorphone is also available as a    scheduled prescription medication.  Drug Absent but Declared for Prescription Verification   Clonazepam                     Not Detected UNEXPECTED ng/mg  creat ==================================================================== Test                      Result    Flag   Units      Ref Range   Creatinine              64               mg/dL      >=20 ==================================================================== Declared Medications:  The flagging and interpretation on this report are based on the  following declared medications.  Unexpected results may arise from  inaccuracies in the declared medications.   **Note: The testing scope of this panel includes these medications:   Clonazepam (Klonopin)  Oxycodone   **Note: The testing scope of this panel does not include the  following reported medications:   Aspirin  Atorvastatin  Baclofen  Buspirone  Cyanocobalamin  Doxycycline  Duloxetine (Cymbalta)  Famotidine  Fluticasone (Flonase)  Folic Acid  Ibuprofen  Levonorgestrel (Mirena)  Metoprolol  Mirtazapine (Remeron)  Nitroglycerin (Nitrostat)  Nystatin  Ondansetron  Pantoprazole (Protonix)  Pregabalin (Lyrica)  Probiotic  Promethazine  Torsemide  Vitamin D2 (Ergocalciferol) ==================================================================== For clinical consultation, please call [phone]. ====================================================================      ROS  Constitutional: Denies any fever or chills Gastrointestinal: No reported hemesis, hematochezia, vomiting, or acute GI distress Musculoskeletal: Denies any acute onset joint swelling, redness, loss of ROM, or weakness Neurological: No reported episodes of acute onset apraxia, aphasia, dysarthria, agnosia, amnesia, paralysis, loss of coordination, or loss of consciousness  Medication Review  Cyanocobalamin, DULoxetine, Vitamin D (Ergocalciferol), aspirin, atorvastatin, baclofen, clonazePAM, famotidine, fluticasone, folic acid, ibuprofen, lactobacillus acidophilus, levonorgestrel, metoprolol tartrate, morphine, nitroGLYCERIN, nystatin,  ondansetron, pantoprazole, pregabalin, promethazine, and torsemide  History Review  Allergy: Sharon Lawrence is allergic to bee venom, erythromycin, cephalexin, neomycin-bacitracin-polymyxin  [bacitracin-neomycin-polymyxin], neosporin original [bacitracin-neomycin-polymyxin], prednisone, sulfa antibiotics, tobrex [tobramycin], tramadol, and penicillins. Drug: Sharon Lawrence  reports no history of drug use. Alcohol:  reports no history of alcohol use. Tobacco:  reports that she quit smoking about 2 years ago. Her smoking use included cigarettes. She has a 3.00 pack-year smoking history. She has never used smokeless tobacco. Social: Sharon Lawrence  reports that she quit smoking about 2 years ago. Her smoking use included cigarettes. She has a 3.00 pack-year smoking history. She has never used smokeless tobacco. She reports that she does not drink alcohol and does not use drugs. Medical:  has a past medical history of ADHD (attention deficit hyperactivity disorder), Anxiety, B12 deficiency (02/07/2011), Back pain, CAD (coronary artery disease), Celiac disease, Folic acid deficiency (02/07/2011), Headache, Iron deficiency anemia (02/07/2011), and Myocardial infarction ([place]) (09/15/2017). Surgical: Sharon Lawrence  has a past surgical history that includes Tonsillectomy; Cholecystectomy; Knee cartilage surgery (Right); LEFT HEART CATH AND CORONARY ANGIOGRAPHY (N/A, 09/15/2017); CORONARY STENT INTERVENTION (Right, 09/15/2017); and Colonoscopy with propofol (N/A, 09/26/2017). Family: family history includes Anxiety disorder in her sister; Bipolar disorder in her son; CAD in her maternal grandfather and paternal grandmother; Cancer in her mother and sister; Cirrhosis in her paternal grandfather; Depression in her sister; Diabetes in her paternal grandmother; Drug abuse in her son; Hypertension in her father and mother; Hypothyroidism in her father and paternal grandmother.  Laboratory Chemistry Profile   Renal Lab Results  Component Value  Date   BUN 18 09/16/2019   CREATININE 0.55 09/16/2019   GFRAA >60 09/16/2019   GFRNONAA >60 09/16/2019     Hepatic Lab Results  Component Value Date   AST 58 (H) 09/16/2019   ALT 71 (H) 09/16/2019   ALBUMIN 3.7 09/16/2019   ALKPHOS 116 09/16/2019     Electrolytes Lab Results  Component Value Date   NA 138 09/16/2019   K 4.2 09/16/2019   CL 104 09/16/2019   CALCIUM 8.4 (L) 09/16/2019   MG 1.9 12/03/2017     Bone Lab Results  Component Value Date   VD25OH 17.25 (L) 09/16/2019   [id number] 47 04/25/2009   [id number] 47 04/25/2009   [id number]  04/25/2009    <8 (NOTE) Vitamin D3, 1,25(OH)2 indicates both endogenous production and supplementation.  Vitamin D2, 1,25(OH)2 is an indicator of exogeous sources, such as diet or supplementation.  Interpretation and therapy are based on measurement of Vitamin  D,1,25(OH)2, Total. This test was developed and its performance characteristics have been  determined by [employer], [place], [place]. Performance characteristics refer to the analytical performance of the test.     Inflammation (CRP: Acute Phase) (ESR: Chronic Phase) Lab Results  Component Value Date   CRP <0.8 09/23/2018   ESRSEDRATE 7 09/23/2018       Note: Above Lab results reviewed.  Recent Imaging Review  MR Knee Right Wo Contrast CLINICAL DATA:  Chronic knee pain. History of prior knee arthroscopy.  EXAM: MRI OF THE RIGHT KNEE WITHOUT CONTRAST  TECHNIQUE: Multiplanar, multisequence MR imaging of the knee was performed. No intravenous contrast was administered.  COMPARISON:  None.  FINDINGS: MENISCI  Medial meniscus:  Intact  Lateral meniscus:  Intact  LIGAMENTS  Cruciates:  Intact  Collaterals:  Intact  CARTILAGE  Patellofemoral: Advanced degenerative chondrosis/chondromalacia most notably involving the lateral patellofemoral joint with marked cartilage loss, joint space narrowing and subchondral cystic change. Slight  lateral tilt of the patella.  Medial: Moderate to advanced degenerative chondrosis most notably involving the femoral articular cartilage laterally. Suspect full or near full-thickness cartilage defect.  Lateral: Moderate degenerative chondrosis with early joint space narrowing and spurring.  Joint:  Small joint effusion.  Popliteal Fossa:  No popliteal mass or Baker's cyst.  Extensor Mechanism: The patella retinacular structures are intact and the quadriceps and patellar tendons are intact.  Bones:  No acute bony findings.  Other: Normal knee musculature.  IMPRESSION: 1. Tricompartmental degenerative changes most significant at the lateral patellofemoral joint (grade 4 chondromalacia). 2. Intact ligamentous structures and no acute bony findings. 3. No meniscal tears. 4. Small joint effusion.  Electronically Signed   By: Marijo Sanes M.D.   On: 06/03/2019 08:23 Note: Reviewed        Physical Exam  General appearance: Well nourished, well developed, and well hydrated. In no apparent acute distress Mental status: Alert, oriented x 3 (person, place, & time)       Respiratory: No evidence of acute respiratory distress Eyes: PERLA Vitals: BP 108/73 (BP Location: Left Arm, Patient Position: Sitting, Cuff Size: Normal)   Pulse 92   Temp 97.7 F (36.5 C) (Temporal)   Resp 16   Ht 5' (1.524 m)   Wt 212 lb (96.2 kg)   SpO2 100%   BMI 41.40 kg/m  BMI: Estimated body mass index is 41.4 kg/m as calculated from the following:   Height as of this encounter: 5' (1.524 m).   Weight as of this encounter: 212 lb (96.2 kg). Ideal: Ideal body weight: 45.5 kg (100 lb 4.9 oz) Adjusted ideal body weight: 65.8 kg (144 lb 15.8 oz)  Assessment   Status Diagnosis  Controlled Controlled Controlled 1. Chronic low back pain (Primary Area of Pain) (Bilateral) (R>L) w/ sciatica (Right)   2. Lumbar facet syndrome (Bilateral)   3. Lumbar facet arthropathy (Bilateral)   4. Pharmacologic  therapy   5. Chronic pain syndrome      Updated Problems: No problems updated.  Plan of Care  Problem-specific:  No problem-specific Assessment & Plan notes found for this encounter.  Sharon Lawrence has a current medication list which includes the following long-term medication(s): atorvastatin, baclofen, clonazepam, duloxetine, duloxetine, fluticasone, levonorgestrel, metoprolol tartrate, [START ON 11/17/2019] morphine, [START ON 12/17/2019] morphine, [START ON 01/16/2020] morphine, nitroglycerin, pantoprazole, pregabalin, promethazine, and torsemide.  Pharmacotherapy (Medications Ordered): Meds ordered this encounter  Medications  . morphine (MS CONTIN) 15 MG 12 hr tablet    Sig: Take 1 tablet (15 mg total) by mouth every 12 (twelve)  hours. Must last 30 days. Do not break tablet    Dispense:  60 tablet    Refill:  0    Chronic Pain: STOP Act (Not applicable) Fill 1 day early if closed on refill date. Avoid benzodiazepines within 8 hours of opioids  . morphine (MS CONTIN) 15 MG 12 hr tablet    Sig: Take 1 tablet (15 mg total) by mouth every 12 (twelve) hours. Must last 30 days. Do not break tablet    Dispense:  60 tablet    Refill:  0    Chronic Pain: STOP Act (Not applicable) Fill 1 day early if closed on refill date. Avoid benzodiazepines within 8 hours of opioids  . morphine (MS CONTIN) 15 MG 12 hr tablet    Sig: Take 1 tablet (15 mg total) by mouth every 12 (twelve) hours. Must last 30 days. Do not break tablet    Dispense:  60 tablet    Refill:  0    Chronic Pain: STOP Act (Not applicable) Fill 1 day early if closed on refill date. Avoid benzodiazepines within 8 hours of opioids   Orders:  Orders Placed This Encounter  Procedures  . LUMBAR FACET(MEDIAL BRANCH NERVE BLOCK) MBNB    Standing Status:   Future    Standing Expiration Date:   12/11/2019    Scheduling Instructions:     Procedure: Lumbar facet block (AKA.: Lumbosacral medial branch nerve block)     Side: Bilateral      Level: L3-4, L4-5, & L5-S1 Facets (L2, L3, L4, L5, & S1 Medial Branch Nerves)     Sedation: Patient's choice.     Timeframe: ASAA    Order Specific Question:   Where will this procedure be performed?    Answer:   [facility]  . [employer] Select 13 (MW), Urine    Volume: 30 ml(s). Minimum 3 ml of urine is needed. Document temperature of fresh sample. Indications: Long term (current) use of opiate analgesic (Z60.109)    Order Specific Question:   Release to patient    Answer:   Immediate   Follow-up plan:   Return for Procedure (w/ sedation): (B) L-FCT BLK #1.      Interventional management options:  Planned, scheduled, and/or pending:    No blocks until after July 15th, 2020 due to MI and blood thinners.   Considering:   Diagnostic bilateral lumbar facet nerve block  Possible bilateral lumbar facet RFA  Diagnostic sacroiliac joint nerve block  Possible sacroiliac joint RFA    PRN Procedures:   None at this time    Recent Visits Date Type Provider Dept  10/18/19 Office Visit Milinda Pointer, MD [facility]  Showing recent visits within past 90 days and meeting all other requirements Today's Visits Date Type Provider Dept  11/11/19 Office Visit Milinda Pointer, MD [facility]  Showing today's visits and meeting all other requirements Future Appointments No visits were found meeting these conditions. Showing future appointments within next 90 days and meeting all other requirements  I discussed the assessment and treatment plan with the patient. The patient was provided an opportunity to ask questions and all were answered. The patient agreed with the plan and demonstrated an understanding of the instructions.  Patient advised to call back or seek an in-person evaluation if the symptoms or condition worsens.  Duration of encounter: 30 minutes.  Note by: Gaspar Cola, MD Date: 11/11/2019; Time: 3:01 PM

## 2019-11-11 NOTE — Progress Notes (Signed)
Nursing Pain Medication Assessment:  Safety precautions to be maintained throughout the outpatient stay will include: orient to surroundings, keep bed in low position, maintain call bell within reach at all times, provide assistance with transfer out of bed and ambulation.  Medication Inspection Compliance: Pill count conducted under aseptic conditions, in front of the patient. Neither the pills nor the bottle was removed from the patient's sight at any time. Once count was completed pills were immediately returned to the patient in their original bottle.  Medication: Morphine IR Pill/Patch Count: 21 of 60 pills remain Pill/Patch Appearance: Markings consistent with prescribed medication Bottle Appearance: Standard pharmacy container. Clearly labeled. Filled Date:10/21/2019 Last Medication intake:  Today

## 2019-11-11 NOTE — Patient Instructions (Addendum)
____________________________________________________________________________________________  Preparing for Procedure with Sedation  Procedure appointments are limited to planned procedures: . No Prescription Refills. . No disability issues will be discussed. . No medication changes will be discussed.  Instructions: . Oral Intake: Do not eat or drink anything for at least 8 hours prior to your procedure. (Exception: Blood Pressure Medication. See below.) . Transportation: Unless otherwise stated by your physician, you may drive yourself after the procedure. . Blood Pressure Medicine: Do not forget to take your blood pressure medicine with a sip of water the morning of the procedure. If your Diastolic (lower reading)is above 100 mmHg, elective cases will be cancelled/rescheduled. . Blood thinners: These will need to be stopped for procedures. Notify our staff if you are taking any blood thinners. Depending on which one you take, there will be specific instructions on how and when to stop it. . Diabetics on insulin: Notify the staff so that you can be scheduled 1st case in the morning. If your diabetes requires high dose insulin, take only  of your normal insulin dose the morning of the procedure and notify the staff that you have done so. . Preventing infections: Shower with an antibacterial soap the morning of your procedure. . Build-up your immune system: Take 1000 mg of Vitamin C with every meal (3 times a day) the day prior to your procedure. Marland Kitchen Antibiotics: Inform the staff if you have a condition or reason that requires you to take antibiotics before dental procedures. . Pregnancy: If you are pregnant, call and cancel the procedure. . Sickness: If you have a cold, fever, or any active infections, call and cancel the procedure. . Arrival: You must be in the facility at least 30 minutes prior to your scheduled procedure. . Children: Do not bring children with you. . Dress appropriately:  Bring dark clothing that you would not mind if they get stained. . Valuables: Do not bring any jewelry or valuables.  Reasons to call and reschedule or cancel your procedure: (Following these recommendations will minimize the risk of a serious complication.) . Surgeries: Avoid having procedures within 2 weeks of any surgery. (Avoid for 2 weeks before or after any surgery). . Flu Shots: Avoid having procedures within 2 weeks of a flu shots or . (Avoid for 2 weeks before or after immunizations). . Barium: Avoid having a procedure within 7-10 days after having had a radiological study involving the use of radiological contrast. (Myelograms, Barium swallow or enema study). . Heart attacks: Avoid any elective procedures or surgeries for the initial 6 months after a "Myocardial Infarction" (Heart Attack). . Blood thinners: It is imperative that you stop these medications before procedures. Let us know if you if you take any blood thinner.  . Infection: Avoid procedures during or within two weeks of an infection (including chest colds or gastrointestinal problems). Symptoms associated with infections include: Localized redness, fever, chills, night sweats or profuse sweating, burning sensation when voiding, cough, congestion, stuffiness, runny nose, sore throat, diarrhea, nausea, vomiting, cold or Flu symptoms, recent or current infections. It is specially important if the infection is over the area that we intend to treat. Marland Kitchen Heart and lung problems: Symptoms that may suggest an active cardiopulmonary problem include: cough, chest pain, breathing difficulties or shortness of breath, dizziness, ankle swelling, uncontrolled high or unusually low blood pressure, and/or palpitations. If you are experiencing any of these symptoms, cancel your procedure and contact your primary care physician for an evaluation.  Remember:  Regular Business hours are:  Monday to Thursday 8:00 AM to 4:00 PM  Provider's  Schedule: Milinda Pointer, MD:  Procedure days: Tuesday and Thursday 7:30 AM to 4:00 PM  Gillis Santa, MD:  Procedure days: Monday and Wednesday 7:30 AM to 4:00 PM ____________________________________________________________________________________________ Three prescriptions for Morphine have been sent to your pharmacy.

## 2019-11-14 LAB — TOXASSURE SELECT 13 (MW), URINE

## 2019-11-19 ENCOUNTER — Encounter: Payer: Self-pay | Admitting: Psychiatry

## 2019-11-19 ENCOUNTER — Other Ambulatory Visit: Payer: Self-pay

## 2019-11-19 ENCOUNTER — Telehealth (INDEPENDENT_AMBULATORY_CARE_PROVIDER_SITE_OTHER): Payer: BC Managed Care – PPO | Admitting: Psychiatry

## 2019-11-19 DIAGNOSIS — F5105 Insomnia due to other mental disorder: Secondary | ICD-10-CM

## 2019-11-19 DIAGNOSIS — F410 Panic disorder [episodic paroxysmal anxiety] without agoraphobia: Secondary | ICD-10-CM

## 2019-11-19 DIAGNOSIS — F411 Generalized anxiety disorder: Secondary | ICD-10-CM | POA: Diagnosis not present

## 2019-11-19 DIAGNOSIS — F330 Major depressive disorder, recurrent, mild: Secondary | ICD-10-CM | POA: Diagnosis not present

## 2019-11-19 NOTE — Progress Notes (Signed)
Provider Location : [facility] Patient Location : Work  Participants: Patient , Provider  Virtual Visit via Video Note  I connected with Sharon Lawrence on 11/19/19 at  8:45 AM EDT by a video enabled telemedicine application and verified that I am speaking with the correct person using two identifiers.   I discussed the limitations of evaluation and management by telemedicine and the availability of in person appointments. The patient expressed understanding and agreed to proceed.    I discussed the assessment and treatment plan with the patient. The patient was provided an opportunity to ask questions and all were answered. The patient agreed with the plan and demonstrated an understanding of the instructions.   The patient was advised to call back or seek an in-person evaluation if the symptoms worsen or if the condition fails to improve as anticipated.   [place] MD OP Progress Note  11/19/2019 12:57 PM Sharon Lawrence  MRN:  [id number]  Chief Complaint:  Chief Complaint    Follow-up     HPI: Sharon Lawrence is a 54 year old Caucasian female, widowed, employed as a Marine scientist, lives in [place], has a history of panic disorder, GAD, MDD, insomnia, coronary artery disease status post STEMI on 09/15/2017, hyperlipidemia, chronic anemia was evaluated by telemedicine today.  Patient today reports she is currently making progress.  She reports she currently does not have any significant anxiety or depressive symptoms.  She has been taking the Cymbalta as prescribed.  She reports sleep is better since she was able to get a new puppy after the loss of her dog who died recently.  She also reports she has started taking melatonin again.  She did use the Remeron a few times and it did help.  However since the melatonin started working she stopped taking the Remeron however would like to keep it to use it as needed.  She denies any suicidality, homicidality or perceptual disturbances.  Patient reports she has stopped  taking the BuSpar since she felt good just on the Cymbalta.  She continues to be in psychotherapy sessions on a weekly basis and reports therapy sessions is beneficial.  She does have significant psychosocial stressors including the cancer diagnosis of her mother who is currently undergoing chemo therapy, COVID-19 viral infection of several members in her family as well as her own health issues.  She however has been coping okay.  Visit Diagnosis:    ICD-10-CM   1. Panic disorder  F41.0   2. GAD (generalized anxiety disorder)  F41.1   3. MDD (major depressive disorder), recurrent episode, mild ([place])  F33.0   4. Insomnia due to mental disorder  F51.05     Past Psychiatric History: I have reviewed past psychiatric history from my progress note on 11/17/2017.  Past trials of Zoloft, Paxil, trazodone, Lunesta, temazepam  Past Medical History:  Past Medical History:  Diagnosis Date  . ADHD (attention deficit hyperactivity disorder)   . Anxiety   . B12 deficiency 02/07/2011  . Back pain   . CAD (coronary artery disease)    a. s/p recent STEMI on 09/15/2017 with DES to RCA  . Celiac disease   . Folic acid deficiency 18/04/9935  . Headache   . Iron deficiency anemia 02/07/2011  . Myocardial infarction ([place]) 09/15/2017    Past Surgical History:  Procedure Laterality Date  . CHOLECYSTECTOMY    . COLONOSCOPY WITH PROPOFOL N/A 09/26/2017   Procedure: COLONOSCOPY WITH PROPOFOL;  Surgeon: Rogene Houston, MD;  [facility];  Service: Endoscopy;  Laterality: N/A;  . CORONARY STENT INTERVENTION Right 09/15/2017   Procedure: CORONARY STENT INTERVENTION;  Surgeon: Jettie Booze, MD;  Location: [facility] [place] [facility];  Service: Cardiovascular;  Laterality: Right;  RCA  . KNEE CARTILAGE SURGERY Right   . LEFT HEART CATH AND CORONARY ANGIOGRAPHY N/A 09/15/2017   Procedure: LEFT HEART CATH AND CORONARY ANGIOGRAPHY;  Surgeon: Jettie Booze, MD;  Location: [facility];  Service:  Cardiovascular;  Laterality: N/A;  . TONSILLECTOMY      Family Psychiatric History: I have reviewed family psychiatric history from my progress note on 11/17/2017  Family History:  Family History  Problem Relation Age of Onset  . Cancer Mother   . Hypertension Mother   . Hypertension Father   . Hypothyroidism Father   . Cancer Sister   . Anxiety disorder Sister   . Depression Sister   . CAD Maternal Grandfather   . Cirrhosis Paternal Grandfather   . CAD Paternal Grandmother   . Diabetes Paternal Grandmother   . Hypothyroidism Paternal Grandmother   . Drug abuse Son   . Bipolar disorder Son     Social History: Reviewed social history from my progress note on 11/17/2017 Social History   Socioeconomic History  . Marital status: Widowed    Spouse name: Not on file  . Number of children: 3  . Years of education: Not on file  . Highest education level: Associate degree: occupational, Hotel manager, or vocational program  Occupational History  . Not on file  Tobacco Use  . Smoking status: Former Smoker    Packs/day: 0.25    Years: 12.00    Pack years: 3.00    Types: Cigarettes    Quit date: 04/04/2017    Years since quitting: 2.6  . Smokeless tobacco: Never Used  Vaping Use  . Vaping Use: Never used  Substance and Sexual Activity  . Alcohol use: No  . Drug use: No  . Sexual activity: Not Currently  Other Topics Concern  . Not on file  Social History Narrative  . Not on file   Social Determinants of Health   Financial Resource Strain:   . Difficulty of Paying Living Expenses: Not on file  Food Insecurity:   . Worried About Charity fundraiser in the Last Year: Not on file  . Ran Out of Food in the Last Year: Not on file  Transportation Needs:   . Lack of Transportation (Medical): Not on file  . Lack of Transportation (Non-Medical): Not on file  Physical Activity:   . Days of Exercise per Week: Not on file  . Minutes of Exercise per Session: Not on file  Stress:    . Feeling of Stress : Not on file  Social Connections:   . Frequency of Communication with Friends and Family: Not on file  . Frequency of Social Gatherings with Friends and Family: Not on file  . Attends Religious Services: Not on file  . Active Member of Clubs or Organizations: Not on file  . Attends Archivist Meetings: Not on file  . Marital Status: Not on file    Allergies:  Allergies  Allergen Reactions  . Bee Venom Anaphylaxis  . Erythromycin Anaphylaxis  . Cephalexin   . Neomycin-Bacitracin-Polymyxin  [Bacitracin-Neomycin-Polymyxin] Swelling    Sight of application  . Neosporin Original [Bacitracin-Neomycin-Polymyxin] Swelling    Swelling at site of application  . Prednisone   . Sulfa Antibiotics Swelling  Sulfa eye drops caused eyes to swell Sulfa eye drops caused eyes to swell  . Tobrex [Tobramycin] Swelling  . Tramadol Hives  . Penicillins Rash    Has patient had a PCN reaction causing immediate rash, facial/tongue/throat swelling, SOB or lightheadedness with hypotension: Yes Has patient had a PCN reaction causing severe rash involving mucus membranes or skin necrosis: No Has patient had a PCN reaction that required hospitalization: No Has patient had a PCN reaction occurring within the last 10 years: No If all of the above answers are "NO", then may proceed with Cephalosporin use.     Metabolic Disorder Labs: Lab Results  Component Value Date   HGBA1C 5.6 09/16/2017   MPG 114 09/16/2017   MPG 123 (H) 07/07/2013   No results found for: PROLACTIN Lab Results  Component Value Date   CHOL 77 09/16/2017   TRIG 64 09/16/2017   HDL 19 (L) 09/16/2017   CHOLHDL 4.1 09/16/2017   VLDL 13 09/16/2017   LDLCALC 45 09/16/2017   LDLCALC 97 07/07/2013   Lab Results  Component Value Date   TSH 2.471 07/07/2013   TSH 1.745 06/03/2012    Therapeutic Level Labs: No results found for: LITHIUM No results found for: VALPROATE No components found for:   CBMZ  Current Medications: Current Outpatient Medications  Medication Sig Dispense Refill  . aspirin 81 MG chewable tablet Chew 1 tablet (81 mg total) by mouth daily.    Marland Kitchen atorvastatin (LIPITOR) 40 MG tablet TAKE 1 TABLET BY MOUTH ONCE DAILY AT 6PM. 30 tablet 6  . baclofen (LIORESAL) 20 MG tablet Take 1 tablet (20 mg total) by mouth daily. 30 tablet 5  . clonazePAM (KLONOPIN) 0.5 MG tablet Take 0.5-1 tablets (0.25-0.5 mg total) by mouth as directed. 1-2 times a week as needed for severe panic sx. 15 tablet 1  . Cyanocobalamin (VITAMIN B-12 IJ) Inject as directed every 30 (thirty) days.     . DULoxetine (CYMBALTA) 20 MG capsule TAKE (1) CAPSULE BY MOUTH ONCE DAILY. *TAKE WITH 60MG CAP* 90 capsule 0  . DULoxetine (CYMBALTA) 60 MG capsule TAKE (1) CAPSULE BY MOUTH ONCE DAILY. 90 capsule 0  . famotidine (PEPCID) 40 MG tablet daily.     . fluticasone (FLONASE) 50 MCG/ACT nasal spray Place 1 spray into both nostrils daily. As needed.    . folic acid (FOLVITE) 1 MG tablet Take 1 mg by mouth 2 (two) times a day.    . ibuprofen (ADVIL,MOTRIN) 800 MG tablet 800 mg every 6 (six) hours as needed.     . lactobacillus acidophilus (BACID) TABS tablet Take 1 tablet by mouth daily.    Marland Kitchen levonorgestrel (MIRENA) 20 MCG/24HR IUD by Intrauterine route.    . metoprolol tartrate (LOPRESSOR) 25 MG tablet Take 1 tablet (25 mg total) by mouth 2 (two) times daily. 180 tablet 3  . morphine (MS CONTIN) 15 MG 12 hr tablet Take 1 tablet (15 mg total) by mouth every 12 (twelve) hours. Must last 30 days. Do not break tablet 60 tablet 0  . [START ON 12/17/2019] morphine (MS CONTIN) 15 MG 12 hr tablet Take 1 tablet (15 mg total) by mouth every 12 (twelve) hours. Must last 30 days. Do not break tablet 60 tablet 0  . [START ON 01/16/2020] morphine (MS CONTIN) 15 MG 12 hr tablet Take 1 tablet (15 mg total) by mouth every 12 (twelve) hours. Must last 30 days. Do not break tablet 60 tablet 0  . nitroGLYCERIN (NITROSTAT) 0.4 MG SL  tablet Place 1 tablet (0.4 mg total) under the tongue every 5 (five) minutes as needed for chest pain. 25 tablet 2  . nystatin (MYCOSTATIN/NYSTOP) powder Apply topically.    . ondansetron (ZOFRAN-ODT) 8 MG disintegrating tablet Take 1 tablet by mouth 3 (three) times daily as needed for nausea.   3  . pantoprazole (PROTONIX) 40 MG tablet TAKE 1 TABLET BY MOUTH ONCE DAILY BEFORE BREAKFAST. 30 tablet 6  . pregabalin (LYRICA) 75 MG capsule Take 1 capsule (75 mg total) by mouth 3 (three) times daily. 90 capsule 2  . promethazine (PHENERGAN) 25 MG tablet Take 25 mg by mouth 3 (three) times daily as needed for nausea.   2  . torsemide (DEMADEX) 20 MG tablet 20 mg once. As needed.  2  . Vitamin D, Ergocalciferol, (DRISDOL) 1.25 MG (50000 UT) CAPS capsule 1 cap weekly 12 weeks, 12 caps     No current facility-administered medications for this visit.     Musculoskeletal: Strength & Muscle Tone: UTA Gait & Station: normal Patient leans: N/A  Psychiatric Specialty Exam: Review of Systems  Psychiatric/Behavioral: Negative for agitation, behavioral problems, confusion, decreased concentration, dysphoric mood, hallucinations, self-injury, sleep disturbance and suicidal ideas. The patient is not nervous/anxious and is not hyperactive.   All other systems reviewed and are negative.   There were no vitals taken for this visit.There is no height or weight on file to calculate BMI.  General Appearance: Casual  Eye Contact:  Fair  Speech:  Normal Rate  Volume:  Normal  Mood:  Euthymic  Affect:  Congruent  Thought Process:  Goal Directed and Descriptions of Associations: Intact  Orientation:  Full (Time, Place, and Person)  Thought Content: Logical   Suicidal Thoughts:  No  Homicidal Thoughts:  No  Memory:  Immediate;   Fair Recent;   Fair Remote;   Fair  Judgement:  Fair  Insight:  Fair  Psychomotor Activity:  Normal  Concentration:  Concentration: Fair and Attention Span: Fair  Recall:  [employer] of Knowledge: Fair  Language: Fair  Akathisia:  No  Handed:  Right  AIMS (if indicated): UTA  Assets:  Communication Skills Desire for Improvement Housing Social Support  ADL's:  Intact  Cognition: WNL  Sleep:  Fair   Screenings: PHQ2-9     Office Visit from 11/11/2019 in [facility] Office Visit from 10/18/2019 in [facility] Office Visit from 04/15/2018 in [facility] Clinical Support from 03/18/2018 in [facility] Office Visit from 12/29/2017 in [facility]  PHQ-2 Total Score 0 0 0 0 0       Assessment and Plan: Sharon Lawrence is a 54 year old Caucasian female, widowed, lives in [place], has a history of depression, anxiety, insomnia, hyperlipidemia, coronary artery disease, MI status post stent placement was evaluated by telemedicine today.  Patient is biologically predisposed given her family history, her own health issues.  Patient with psychosocial stressors of health issues of her mother, COVID-19 infection of her family members.  Patient continues to however make progress on the current medication regimen.  Plan as noted below.  Plan Panic disorder-stable Cymbalta 80 mg p.o. daily Discontinue BuSpar for noncompliance. Klonopin 0.25-0.5 mg p.o. as needed 1-2 times a week for severe panic attacks only.  She rarely uses it.  Patient is aware about the risk of combining medications like benzodiazepines  and opioids.  GAD-stable Cymbalta as prescribed  MDD in remission Cymbalta as prescribed.  Insomnia-stable Patient currently uses melatonin. She does have Remeron 7.5-15 mg p.o. nightly as needed available.  She reports when she uses it it does help.  Patient to continue CBT as needed  Follow-up in clinic in 6 to 8 weeks or sooner if needed.  I have  spent atleast 20 minutes face to face with patient today. More than 50 % of the time was spent for preparing to see the patient ( e.g., review of test, records ),ordering medications and test ,psychoeducation and supportive psychotherapy and care coordination,as well as documenting clinical information in electronic health record. This note was generated in part or whole with voice recognition software. Voice recognition is usually quite accurate but there are transcription errors that can and very often do occur. I apologize for any typographical errors that were not detected and corrected.        Ursula Alert, MD 11/19/2019, 12:57 PM

## 2019-11-24 ENCOUNTER — Telehealth: Payer: Self-pay

## 2019-11-24 DIAGNOSIS — F411 Generalized anxiety disorder: Secondary | ICD-10-CM

## 2019-11-24 DIAGNOSIS — F410 Panic disorder [episodic paroxysmal anxiety] without agoraphobia: Secondary | ICD-10-CM

## 2019-11-24 MED ORDER — CLONAZEPAM 0.5 MG PO TABS: 0.2500 mg | ORAL_TABLET | ORAL | 1 refills | Status: DC

## 2019-11-24 NOTE — Telephone Encounter (Signed)
received notice for a medication refill on clonazepam .55m

## 2019-11-24 NOTE — Telephone Encounter (Signed)
I have sent Klonopin to pharmacy.

## 2019-11-30 ENCOUNTER — Encounter: Payer: Self-pay | Admitting: Pain Medicine

## 2019-11-30 DIAGNOSIS — Z23 Encounter for immunization: Secondary | ICD-10-CM | POA: Diagnosis not present

## 2019-12-03 ENCOUNTER — Telehealth: Payer: Self-pay | Admitting: Pain Medicine

## 2019-12-03 NOTE — Telephone Encounter (Signed)
Patient lvmail stating she is still having too much nausea with the Morphine and is unable to tolerate it. She had a few Oxycodone left and has stopped the morphine and is taking Oxycodone. Please call to discuss what is happening.

## 2019-12-03 NOTE — Telephone Encounter (Signed)
Please schedule a virtual appointment with Dr Dossie Arbour.  There can not be any changes made to her medication without discussing with him.

## 2019-12-09 ENCOUNTER — Ambulatory Visit
Admission: RE | Admit: 2019-12-09 | Discharge: 2019-12-09 | Disposition: A | Discharge status: Home / Self Care | Payer: BC Managed Care – PPO | Source: Ambulatory Visit | Attending: Pain Medicine | Admitting: Pain Medicine

## 2019-12-09 ENCOUNTER — Ambulatory Visit (HOSPITAL_BASED_OUTPATIENT_CLINIC_OR_DEPARTMENT_OTHER): Payer: BC Managed Care – PPO | Admitting: Pain Medicine

## 2019-12-09 ENCOUNTER — Encounter: Payer: Self-pay | Admitting: Pain Medicine

## 2019-12-09 ENCOUNTER — Other Ambulatory Visit: Payer: Self-pay

## 2019-12-09 VITALS — BP 110/70 | HR 92 | Temp 97.1°F | Resp 14 | Ht 60.0 in | Wt 198.0 lb

## 2019-12-09 DIAGNOSIS — M47816 Spondylosis without myelopathy or radiculopathy, lumbar region: Secondary | ICD-10-CM | POA: Insufficient documentation

## 2019-12-09 DIAGNOSIS — M545 Low back pain, unspecified: Secondary | ICD-10-CM | POA: Diagnosis not present

## 2019-12-09 DIAGNOSIS — M7138 Other bursal cyst, other site: Secondary | ICD-10-CM | POA: Diagnosis not present

## 2019-12-09 DIAGNOSIS — M47817 Spondylosis without myelopathy or radiculopathy, lumbosacral region: Secondary | ICD-10-CM | POA: Insufficient documentation

## 2019-12-09 DIAGNOSIS — M5136 Other intervertebral disc degeneration, lumbar region: Secondary | ICD-10-CM | POA: Insufficient documentation

## 2019-12-09 DIAGNOSIS — G8929 Other chronic pain: Secondary | ICD-10-CM | POA: Diagnosis not present

## 2019-12-09 MED ORDER — LIDOCAINE HCL 2 % IJ SOLN
20.0000 mL | Freq: Once | INTRAMUSCULAR | Status: AC
  Administered 2019-12-09: 400 mg

## 2019-12-09 MED ORDER — LIDOCAINE HCL 2 % IJ SOLN
INTRAMUSCULAR | Status: AC
  Filled 2019-12-09: qty 20

## 2019-12-09 MED ORDER — TRIAMCINOLONE ACETONIDE 40 MG/ML IJ SUSP
80.0000 mg | Freq: Once | INTRAMUSCULAR | Status: AC
  Administered 2019-12-09: 80 mg

## 2019-12-09 MED ORDER — LACTATED RINGERS IV SOLN
1000.0000 mL | Freq: Once | INTRAVENOUS | Status: AC
  Administered 2019-12-09: 1000 mL via INTRAVENOUS

## 2019-12-09 MED ORDER — ROPIVACAINE HCL 2 MG/ML IJ SOLN
18.0000 mL | Freq: Once | INTRAMUSCULAR | Status: AC
  Administered 2019-12-09: 18 mL via PERINEURAL

## 2019-12-09 MED ORDER — FENTANYL CITRATE (PF) 100 MCG/2ML IJ SOLN
INTRAMUSCULAR | Status: AC
  Filled 2019-12-09: qty 2

## 2019-12-09 MED ORDER — MIDAZOLAM HCL 5 MG/5ML IJ SOLN
INTRAMUSCULAR | Status: AC
  Filled 2019-12-09: qty 5

## 2019-12-09 MED ORDER — ROPIVACAINE HCL 2 MG/ML IJ SOLN
INTRAMUSCULAR | Status: AC
  Filled 2019-12-09: qty 10

## 2019-12-09 MED ORDER — TRIAMCINOLONE ACETONIDE 40 MG/ML IJ SUSP
INTRAMUSCULAR | Status: AC
  Filled 2019-12-09: qty 1

## 2019-12-09 MED ORDER — FENTANYL CITRATE (PF) 100 MCG/2ML IJ SOLN
25.0000 ug | INTRAMUSCULAR | Status: DC | PRN
  Administered 2019-12-09: 50 ug via INTRAVENOUS

## 2019-12-09 MED ORDER — MIDAZOLAM HCL 5 MG/5ML IJ SOLN
1.0000 mg | INTRAMUSCULAR | Status: DC | PRN
  Administered 2019-12-09: 2 mg via INTRAVENOUS
  Administered 2019-12-09: 1 mg via INTRAVENOUS

## 2019-12-09 NOTE — Progress Notes (Signed)
PROVIDER NOTE: Information contained herein reflects review and annotations entered in association with encounter. Interpretation of such information and data should be left to medically-trained personnel. Information provided to patient can be located elsewhere in the medical record under "Patient Instructions". Document created using STT-dictation technology, any transcriptional errors that may result from process are unintentional.    Patient: Sharon Lawrence  Service Category: Procedure  Provider: Gaspar Cola, MD  DOB: Mar 26, 1965  DOS: 12/09/2019  Location: [facility]  MRN: [id number]  Setting: Ambulatory - outpatient  Referring Provider: Vidal Schwalbe, MD  Type: Established Patient  Specialty: Interventional Pain Management  PCP: Vidal Schwalbe, MD   Primary Reason for Visit: Interventional Pain Management Treatment. CC: Back Pain (lower)  Procedure:          Anesthesia, Analgesia, Anxiolysis:  Type: Lumbar Facet, Medial Branch Block(s) #1  Primary Purpose: Diagnostic Region: Posterolateral Lumbosacral Spine Level: L2, L3, L4, L5, & S1 Medial Branch Level(s). Injecting these levels blocks the L3-4, L4-5, and L5-S1 lumbar facet joints. Laterality: Bilateral  Type: Moderate (Conscious) Sedation combined with Local Anesthesia Indication(s): Analgesia and Anxiety Route: Intravenous (IV) IV Access: Secured Sedation: Meaningful verbal contact was maintained at all times during the procedure  Local Anesthetic: Lidocaine 1-2%  Position: Prone   Indications: 1. Lumbar facet syndrome (Bilateral)   2. Spondylosis without myelopathy or radiculopathy, lumbosacral region   3. Lumbar facet synovial cyst (L5-S1) (Left)   4. DDD (degenerative disc disease), lumbar   5. Chronic low back pain (Bilateral) w/o sciatica    Pain Score: Pre-procedure: 8 /10 Post-procedure: 0-No pain/10   Pre-op Assessment:  Sharon Lawrence is a 54 y.o. (year old), female patient, seen today for  interventional treatment. She  has a past surgical history that includes Tonsillectomy; Cholecystectomy; Knee cartilage surgery (Right); LEFT HEART CATH AND CORONARY ANGIOGRAPHY (N/A, 09/15/2017); CORONARY STENT INTERVENTION (Right, 09/15/2017); and Colonoscopy with propofol (N/A, 09/26/2017). Sharon Lawrence has a current medication list which includes the following prescription(s): aspirin, atorvastatin, baclofen, clonazepam, cyanocobalamin, duloxetine, duloxetine, famotidine, fluticasone, folic acid, ibuprofen, lactobacillus acidophilus, levonorgestrel, [START ON 12/17/2019] morphine, nitroglycerin, nystatin, ondansetron, pantoprazole, pregabalin, promethazine, torsemide, vitamin d (ergocalciferol), metoprolol tartrate, and [START ON 01/16/2020] morphine, and the following Facility-Administered Medications: fentanyl and midazolam. Her primarily concern today is the Back Pain (lower)  Initial Vital Signs:  Pulse/HCG Rate: 87ECG Heart Rate: 90 Temp: (!) 97 F (36.1 C) Resp: 16 BP: 124/84 SpO2: 100 %  BMI: Estimated body mass index is 38.67 kg/m as calculated from the following:   Height as of this encounter: 5' (1.524 m).   Weight as of this encounter: 198 lb (89.8 kg).  Risk Assessment: Allergies: Reviewed. She is allergic to bee venom, erythromycin, cephalexin, neomycin-bacitracin-polymyxin  [bacitracin-neomycin-polymyxin], neosporin original [bacitracin-neomycin-polymyxin], prednisone, sulfa antibiotics, tobrex [tobramycin], tramadol, and penicillins.  Allergy Precautions: None required Coagulopathies: Reviewed. None identified.  Blood-thinner therapy: None at this time Active Infection(s): Reviewed. None identified. Sharon Lawrence is afebrile  Site Confirmation: Sharon Lawrence was asked to confirm the procedure and laterality before marking the site Procedure checklist: Completed Consent: Before the procedure and under the influence of no sedative(s), amnesic(s), or anxiolytics, the patient was informed of  the treatment options, risks and possible complications. To fulfill our ethical and legal obligations, as recommended by the American Medical Association's Code of Ethics, I have informed the patient of my clinical impression; the nature and purpose of the treatment or procedure; the risks, benefits, and possible complications of the intervention; the alternatives,  including doing nothing; the risk(s) and benefit(s) of the alternative treatment(s) or procedure(s); and the risk(s) and benefit(s) of doing nothing. The patient was provided information about the general risks and possible complications associated with the procedure. These may include, but are not limited to: failure to achieve desired goals, infection, bleeding, organ or nerve damage, allergic reactions, paralysis, and death. In addition, the patient was informed of those risks and complications associated to Spine-related procedures, such as failure to decrease pain; infection (i.e.: Meningitis, epidural or intraspinal abscess); bleeding (i.e.: epidural hematoma, subarachnoid hemorrhage, or any other type of intraspinal or peri-dural bleeding); organ or nerve damage (i.e.: Any type of peripheral nerve, nerve root, or spinal cord injury) with subsequent damage to sensory, motor, and/or autonomic systems, resulting in permanent pain, numbness, and/or weakness of one or several areas of the body; allergic reactions; (i.e.: anaphylactic reaction); and/or death. Furthermore, the patient was informed of those risks and complications associated with the medications. These include, but are not limited to: allergic reactions (i.e.: anaphylactic or anaphylactoid reaction(s)); adrenal axis suppression; blood sugar elevation that in diabetics may result in ketoacidosis or comma; water retention that in patients with history of congestive heart failure may result in shortness of breath, pulmonary edema, and decompensation with resultant heart failure; weight  gain; swelling or edema; medication-induced neural toxicity; particulate matter embolism and blood vessel occlusion with resultant organ, and/or nervous system infarction; and/or aseptic necrosis of one or more joints. Finally, the patient was informed that Medicine is not an exact science; therefore, there is also the possibility of unforeseen or unpredictable risks and/or possible complications that may result in a catastrophic outcome. The patient indicated having understood very clearly. We have given the patient no guarantees and we have made no promises. Enough time was given to the patient to ask questions, all of which were answered to the patient's satisfaction. Ms. Leverett has indicated that she wanted to continue with the procedure. Attestation: I, the ordering provider, attest that I have discussed with the patient the benefits, risks, side-effects, alternatives, likelihood of achieving goals, and potential problems during recovery for the procedure that I have provided informed consent. Date  Time: 12/09/2019  8:20 AM  Pre-Procedure Preparation:  Monitoring: As per clinic protocol. Respiration, ETCO2, SpO2, BP, heart rate and rhythm monitor placed and checked for adequate function Safety Precautions: Patient was assessed for positional comfort and pressure points before starting the procedure. Time-out: I initiated and conducted the "Time-out" before starting the procedure, as per protocol. The patient was asked to participate by confirming the accuracy of the "Time Out" information. Verification of the correct person, site, and procedure were performed and confirmed by me, the nursing staff, and the patient. "Time-out" conducted as per Joint Commission's Universal Protocol (UP.01.01.01). Time: 0845  Description of Procedure:          Laterality: Bilateral. The procedure was performed in identical fashion on both sides. Levels:  L2, L3, L4, L5, & S1 Medial Branch Level(s) Area Prepped:  Posterior Lumbosacral Region DuraPrep (Iodine Povacrylex [0.7% available iodine] and Isopropyl Alcohol, 74% w/w) Safety Precautions: Aspiration looking for blood return was conducted prior to all injections. At no point did we inject any substances, as a needle was being advanced. Before injecting, the patient was told to immediately notify me if she was experiencing any new onset of "ringing in the ears, or metallic taste in the mouth". No attempts were made at seeking any paresthesias. Safe injection practices and needle disposal techniques used.  Medications properly checked for expiration dates. SDV (single dose vial) medications used. After the completion of the procedure, all disposable equipment used was discarded in the proper designated medical waste containers. Local Anesthesia: Protocol guidelines were followed. The patient was positioned over the fluoroscopy table. The area was prepped in the usual manner. The time-out was completed. The target area was identified using fluoroscopy. A 12-in long, straight, sterile hemostat was used with fluoroscopic guidance to locate the targets for each level blocked. Once located, the skin was marked with an approved surgical skin marker. Once all sites were marked, the skin (epidermis, dermis, and hypodermis), as well as deeper tissues (fat, connective tissue and muscle) were infiltrated with a small amount of a short-acting local anesthetic, loaded on a 10cc syringe with a 25G, 1.5-in  Needle. An appropriate amount of time was allowed for local anesthetics to take effect before proceeding to the next step. Local Anesthetic: Lidocaine 2.0% The unused portion of the local anesthetic was discarded in the proper designated containers. Technical explanation of process:  L2 Medial Branch Nerve Block (MBB): The target area for the L2 medial branch is at the junction of the postero-lateral aspect of the superior articular process and the superior, posterior, and medial  edge of the transverse process of L3. Under fluoroscopic guidance, a Quincke needle was inserted until contact was made with os over the superior postero-lateral aspect of the pedicular shadow (target area). After negative aspiration for blood, 0.5 mL of the nerve block solution was injected without difficulty or complication. The needle was removed intact. L3 Medial Branch Nerve Block (MBB): The target area for the L3 medial branch is at the junction of the postero-lateral aspect of the superior articular process and the superior, posterior, and medial edge of the transverse process of L4. Under fluoroscopic guidance, a Quincke needle was inserted until contact was made with os over the superior postero-lateral aspect of the pedicular shadow (target area). After negative aspiration for blood, 0.5 mL of the nerve block solution was injected without difficulty or complication. The needle was removed intact. L4 Medial Branch Nerve Block (MBB): The target area for the L4 medial branch is at the junction of the postero-lateral aspect of the superior articular process and the superior, posterior, and medial edge of the transverse process of L5. Under fluoroscopic guidance, a Quincke needle was inserted until contact was made with os over the superior postero-lateral aspect of the pedicular shadow (target area). After negative aspiration for blood, 0.5 mL of the nerve block solution was injected without difficulty or complication. The needle was removed intact. L5 Medial Branch Nerve Block (MBB): The target area for the L5 medial branch is at the junction of the postero-lateral aspect of the superior articular process and the superior, posterior, and medial edge of the sacral ala. Under fluoroscopic guidance, a Quincke needle was inserted until contact was made with os over the superior postero-lateral aspect of the pedicular shadow (target area). After negative aspiration for blood, 0.5 mL of the nerve block solution  was injected without difficulty or complication. The needle was removed intact. S1 Medial Branch Nerve Block (MBB): The target area for the S1 medial branch is at the posterior and inferior 6 o'clock position of the L5-S1 facet joint. Under fluoroscopic guidance, the Quincke needle inserted for the L5 MBB was redirected until contact was made with os over the inferior and postero aspect of the sacrum, at the 6 o' clock position under the L5-S1 facet joint (Target  area). After negative aspiration for blood, 0.5 mL of the nerve block solution was injected without difficulty or complication. The needle was removed intact.  Nerve block solution: 0.2% PF-Ropivacaine + Triamcinolone (40 mg/mL) diluted to a final concentration of 4 mg of Triamcinolone/mL of Ropivacaine The unused portion of the solution was discarded in the proper designated containers. Procedural Needles: 22-gauge, 5-inch, Quincke needles used for all levels.  Once the entire procedure was completed, the treated area was cleaned, making sure to leave some of the prepping solution back to take advantage of its long term bactericidal properties.   Illustration of the posterior view of the lumbar spine and the posterior neural structures. Laminae of L2 through S1 are labeled. DPRL5, dorsal primary ramus of L5; DPRS1, dorsal primary ramus of S1; DPR3, dorsal primary ramus of L3; FJ, facet (zygapophyseal) joint L3-L4; I, inferior articular process of L4; LB1, lateral branch of dorsal primary ramus of L1; IAB, inferior articular branches from L3 medial branch (supplies L4-L5 facet joint); IBP, intermediate branch plexus; MB3, medial branch of dorsal primary ramus of L3; NR3, third lumbar nerve root; S, superior articular process of L5; SAB, superior articular branches from L4 (supplies L4-5 facet joint also); TP3, transverse process of L3.  Vitals:   12/09/19 0855 12/09/19 0905 12/09/19 0915 12/09/19 0925  BP: 131/75 114/73 108/72 110/70  Pulse:  92     Resp: (!) 22 16 19 14   Temp:  (!) 97.1 F (36.2 C)  (!) 97.1 F (36.2 C)  TempSrc:      SpO2: (!) 39% 99% 98% 99%  Weight:      Height:         Start Time: 0845 hrs. End Time: 0500 hrs.  Imaging Guidance (Spinal):          Type of Imaging Technique: Fluoroscopy Guidance (Spinal) Indication(s): Assistance in needle guidance and placement for procedures requiring needle placement in or near specific anatomical locations not easily accessible without such assistance. Exposure Time: Please see nurses notes. Contrast: None used. Fluoroscopic Guidance: I was personally present during the use of fluoroscopy. "Tunnel Vision Technique" used to obtain the best possible view of the target area. Parallax error corrected before commencing the procedure. "Direction-depth-direction" technique used to introduce the needle under continuous pulsed fluoroscopy. Once target was reached, antero-posterior, oblique, and lateral fluoroscopic projection used confirm needle placement in all planes. Images permanently stored in EMR. Interpretation: No contrast injected. I personally interpreted the imaging intraoperatively. Adequate needle placement confirmed in multiple planes. Permanent images saved into the patient's record.  Antibiotic Prophylaxis:   Anti-infectives (From admission, onward)   None     Indication(s): None identified  Post-operative Assessment:  Post-procedure Vital Signs:  Pulse/HCG Rate: 92 (nsr)87 Temp: (!) 97.1 F (36.2 C) Resp: 14 BP: 110/70 SpO2: 99 %  EBL: None  Complications: No immediate post-treatment complications observed by team, or reported by patient.  Note: The patient tolerated the entire procedure well. A repeat set of vitals were taken after the procedure and the patient was kept under observation following institutional policy, for this type of procedure. Post-procedural neurological assessment was performed, showing return to baseline, prior to  discharge. The patient was provided with post-procedure discharge instructions, including a section on how to identify potential problems. Should any problems arise concerning this procedure, the patient was given instructions to immediately contact us, at any time, without hesitation. In any case, we plan to contact the patient by telephone for a follow-up status report regarding  this interventional procedure.  Comments:  No additional relevant information.  Plan of Care  Orders:  Orders Placed This Encounter  Procedures  . LUMBAR FACET(MEDIAL BRANCH NERVE BLOCK) MBNB    Scheduling Instructions:     Procedure: Lumbar facet block (AKA.: Lumbosacral medial branch nerve block)     Side: Bilateral     Level: L3-4, L4-5, & L5-S1 Facets (L2, L3, L4, L5, & S1 Medial Branch Nerves)     Sedation: Patient's choice.     Timeframe: Today    Order Specific Question:   Where will this procedure be performed?    Answer:   [facility]  . DG Lumbar Spine Complete W/Bend    Patient presents with axial pain with possible radicular component.  In addition to any acute findings, please report on:  1. Facet (Zygapophyseal) joint DJD (Hypertrophy, space narrowing, subchondral sclerosis, and/or osteophyte formation) 2. DDD and/or IVDD (Loss of disc height, desiccation or "Black disc disease") 3. Pars defects 4. Spondylolisthesis, spondylosis, and/or spondyloarthropathies (include Degree/Grade of displacement in mm) 5. Vertebral body Fractures, including age (old, new/acute) 79. Modic Type Changes 7. Demineralization 8. Bone pathology 9. Central, Lateral Recess, and/or Foraminal Stenosis (include AP diameter of stenosis in mm) 10. Surgical changes (hardware type, status, and presence of fibrosis)  NOTE: Please specify level(s) and laterality. If applicable: Please indicate ROM and/or evidence of instability (>76m displacement between flexion and extension views)    Standing Status:   Future     Standing Expiration Date:   01/09/2020    Scheduling Instructions:     Imaging must be done as soon as possible. Inform patient that order will expire within 30 days and I will not renew it.    Order Specific Question:   Reason for Exam (SYMPTOM  OR DIAGNOSIS REQUIRED)    Answer:   Low back pain    Order Specific Question:   Is patient pregnant?    Answer:   No    Order Specific Question:   Preferred imaging location?    Answer:   [facility]    Order Specific Question:   Call Results- Best Contact Number?    Answer:   [phone][facility]    Order Specific Question:   Radiology Contrast Protocol - do NOT remove file path    Answer:   [email or web address]    Order Specific Question:   Release to patient    Answer:   Immediate  . DG [facility] C-ARM 1-60 MIN NO REPORT    Intraoperative interpretation by procedural physician at [facility]    Standing Status:   Standing    Number of Occurrences:   1    Order Specific Question:   Reason for exam:    Answer:   Assistance in needle guidance and placement for procedures requiring needle placement in or near specific anatomical locations not easily accessible without such assistance.  . Informed Consent Details: Physician/Practitioner Attestation; Transcribe to consent form and obtain patient signature    Nursing Order: Transcribe to consent form and obtain patient signature. Note: Always confirm laterality of pain with Ms. Bohlin, before procedure.    Order Specific Question:   Physician/Practitioner attestation of informed consent for procedure/surgical case    Answer:   I, the physician/practitioner, attest that I have discussed with the patient the benefits, risks, side effects, alternatives, likelihood of achieving goals and potential problems during recovery for the procedure that I have provided informed consent.  Order Specific Question:   Procedure    Answer:   Lumbar  Facet Block  under fluoroscopic guidance    Order Specific Question:   Physician/Practitioner performing the procedure    Answer:   Kasheem Toner A. Dossie Arbour MD    Order Specific Question:   Indication/Reason    Answer:   Low Back Pain, with our without leg pain, due to Facet Joint Arthralgia (Joint Pain) Spondylosis (Arthritis of the Spine), without myelopathy or radiculopathy (Nerve Damage).  . Provide equipment / supplies at bedside    "Block Tray" (Disposable  single use) Needle type: Spinal Amount/quantity: 4 Size: Medium (5-inch) Gauge: 22G    Standing Status:   Standing    Number of Occurrences:   1    Order Specific Question:   Specify    Answer:   Block Tray   Chronic Opioid Analgesic:  Oxycodone IR 5 mg every 6 hours (20 mg/day of oxycodone) MME/day:27m/day.   Medications ordered for procedure: Meds ordered this encounter  Medications  . lidocaine (XYLOCAINE) 2 % (with pres) injection 400 mg  . lactated ringers infusion 1,000 mL  . midazolam (VERSED) 5 MG/5ML injection 1-2 mg    Make sure Flumazenil is available in the pyxis when using this medication. If oversedation occurs, administer 0.2 mg IV over 15 sec. If after 45 sec no response, administer 0.2 mg again over 1 min; may repeat at 1 min intervals; not to exceed 4 doses (1 mg)  . fentaNYL (SUBLIMAZE) injection 25-50 mcg    Make sure Narcan is available in the pyxis when using this medication. In the event of respiratory depression (RR< 8/min): Titrate NARCAN (naloxone) in increments of 0.1 to 0.2 mg IV at 2-3 minute intervals, until desired degree of reversal.  . ropivacaine (PF) 2 mg/mL (0.2%) (NAROPIN) injection 18 mL  . triamcinolone acetonide (KENALOG-40) injection 80 mg   Medications administered: We administered lidocaine, lactated ringers, midazolam, fentaNYL, ropivacaine (PF) 2 mg/mL (0.2%), and triamcinolone acetonide.  See the medical record for exact dosing, route, and time of administration.  Follow-up  plan:   Return in about 2 weeks (around 12/23/2019) for (F2F), (PP) Follow-up.       Interventional management options:  Planned, scheduled, and/or pending:    No blocks until after July 15th, 2020 due to MI and blood thinners.   Considering:   Diagnostic bilateral lumbar facet nerve block  Possible bilateral lumbar facet RFA  Diagnostic sacroiliac joint nerve block  Possible sacroiliac joint RFA    PRN Procedures:   None at this time    Recent Visits Date Type Provider Dept  11/11/19 Office Visit NMilinda Pointer MD [facility]  10/18/19 Office Visit NMilinda Pointer MD [facility]  Showing recent visits within past 90 days and meeting all other requirements Today's Visits Date Type Provider Dept  12/09/19 Procedure visit NMilinda Pointer MD [facility]  Showing today's visits and meeting all other requirements Future Appointments Date Type Provider Dept  12/23/19 Appointment NMilinda Pointer MD [facility]  02/14/20 Appointment NMilinda Pointer MD [facility]  Showing future appointments within next 90 days and meeting all other requirements  Disposition: Discharge home  Discharge (Date  Time): 12/09/2019; 0931 hrs.   Primary Care Physician: KVidal Schwalbe MD [facility] Note by: FGaspar Cola MD Date: 12/09/2019; Time: 10:27 AM  Disclaimer:  Medicine is not an exact science. The only guarantee in medicine is that nothing is guaranteed. It  is important to note that the decision to proceed with this intervention was based on the information collected from the patient. The Data and conclusions were drawn from the patient's questionnaire, the interview, and the physical examination. Because the information was provided in large part by the patient, it cannot be guaranteed that it has not been purposely or unconsciously manipulated. Every effort has been made to obtain  as much relevant data as possible for this evaluation. It is important to note that the conclusions that lead to this procedure are derived in large part from the available data. Always take into account that the treatment will also be dependent on availability of resources and existing treatment guidelines, considered by other Pain Management Practitioners as being common knowledge and practice, at the time of the intervention. For Medico-Legal purposes, it is also important to point out that variation in procedural techniques and pharmacological choices are the acceptable norm. The indications, contraindications, technique, and results of the above procedure should only be interpreted and judged by a Board-Certified Interventional Pain Specialist with extensive familiarity and expertise in the same exact procedure and technique.

## 2019-12-09 NOTE — Patient Instructions (Signed)

## 2019-12-09 NOTE — Progress Notes (Signed)
Safety precautions to be maintained throughout the outpatient stay will include: orient to surroundings, keep bed in low position, maintain call bell within reach at all times, provide assistance with transfer out of bed and ambulation.  

## 2019-12-10 ENCOUNTER — Telehealth: Payer: Self-pay | Admitting: *Deleted

## 2019-12-10 NOTE — Telephone Encounter (Signed)
Attempted to call for post procedure follow-up. Message left. 

## 2019-12-16 DIAGNOSIS — Z20828 Contact with and (suspected) exposure to other viral communicable diseases: Secondary | ICD-10-CM | POA: Diagnosis not present

## 2019-12-22 ENCOUNTER — Encounter (HOSPITAL_COMMUNITY): Payer: Self-pay | Admitting: Oncology

## 2019-12-22 ENCOUNTER — Other Ambulatory Visit (HOSPITAL_COMMUNITY): Payer: Self-pay | Admitting: *Deleted

## 2019-12-22 DIAGNOSIS — M47816 Spondylosis without myelopathy or radiculopathy, lumbar region: Secondary | ICD-10-CM

## 2019-12-22 DIAGNOSIS — M47817 Spondylosis without myelopathy or radiculopathy, lumbosacral region: Secondary | ICD-10-CM

## 2019-12-22 DIAGNOSIS — D509 Iron deficiency anemia, unspecified: Secondary | ICD-10-CM

## 2019-12-22 DIAGNOSIS — M5136 Other intervertebral disc degeneration, lumbar region: Secondary | ICD-10-CM

## 2019-12-22 DIAGNOSIS — G8929 Other chronic pain: Secondary | ICD-10-CM

## 2019-12-22 DIAGNOSIS — M7138 Other bursal cyst, other site: Secondary | ICD-10-CM

## 2019-12-22 NOTE — Progress Notes (Signed)
Orders for upcoming labs faxed to [facility] per patient's request.  They will draw labs and fax results to [place].

## 2019-12-23 ENCOUNTER — Other Ambulatory Visit: Payer: Self-pay

## 2019-12-23 ENCOUNTER — Encounter: Payer: Self-pay | Admitting: Pain Medicine

## 2019-12-23 ENCOUNTER — Ambulatory Visit: Payer: BC Managed Care – PPO | Attending: Pain Medicine | Admitting: Pain Medicine

## 2019-12-23 ENCOUNTER — Telehealth: Payer: Self-pay | Admitting: *Deleted

## 2019-12-23 ENCOUNTER — Inpatient Hospital Stay (HOSPITAL_COMMUNITY): Payer: BC Managed Care – PPO

## 2019-12-23 VITALS — BP 130/72 | HR 66 | Temp 97.7°F | Resp 16 | Ht 62.0 in | Wt 198.0 lb

## 2019-12-23 DIAGNOSIS — G894 Chronic pain syndrome: Secondary | ICD-10-CM | POA: Insufficient documentation

## 2019-12-23 DIAGNOSIS — M79604 Pain in right leg: Secondary | ICD-10-CM

## 2019-12-23 DIAGNOSIS — M7918 Myalgia, other site: Secondary | ICD-10-CM | POA: Diagnosis not present

## 2019-12-23 DIAGNOSIS — Z79899 Other long term (current) drug therapy: Secondary | ICD-10-CM | POA: Insufficient documentation

## 2019-12-23 DIAGNOSIS — M792 Neuralgia and neuritis, unspecified: Secondary | ICD-10-CM | POA: Insufficient documentation

## 2019-12-23 DIAGNOSIS — M47816 Spondylosis without myelopathy or radiculopathy, lumbar region: Secondary | ICD-10-CM | POA: Diagnosis not present

## 2019-12-23 DIAGNOSIS — M5441 Lumbago with sciatica, right side: Secondary | ICD-10-CM | POA: Diagnosis not present

## 2019-12-23 DIAGNOSIS — G8929 Other chronic pain: Secondary | ICD-10-CM

## 2019-12-23 DIAGNOSIS — M62830 Muscle spasm of back: Secondary | ICD-10-CM

## 2019-12-23 MED ORDER — PREGABALIN 75 MG PO CAPS: 75.0000 mg | ORAL_CAPSULE | Freq: Three times a day (TID) | ORAL | 2 refills | Status: DC

## 2019-12-23 MED ORDER — OXYCODONE HCL 5 MG PO TABS: 5.0000 mg | ORAL_TABLET | Freq: Four times a day (QID) | ORAL | 0 refills | Status: DC | PRN

## 2019-12-23 NOTE — Telephone Encounter (Signed)
Cancelled all current scripts for Morphine.

## 2019-12-23 NOTE — Patient Instructions (Addendum)
____________________________________________________________________________________________  Preparing for Procedure with Sedation  Procedure appointments are limited to planned procedures: . No Prescription Refills. . No disability issues will be discussed. . No medication changes will be discussed.  Instructions: . Oral Intake: Do not eat or drink anything for at least 8 hours prior to your procedure. (Exception: Blood Pressure Medication. See below.) . Transportation: Unless otherwise stated by your physician, you may drive yourself after the procedure. . Blood Pressure Medicine: Do not forget to take your blood pressure medicine with a sip of water the morning of the procedure. If your Diastolic (lower reading)is above 100 mmHg, elective cases will be cancelled/rescheduled. . Blood thinners: These will need to be stopped for procedures. Notify our staff if you are taking any blood thinners. Depending on which one you take, there will be specific instructions on how and when to stop it. . Diabetics on insulin: Notify the staff so that you can be scheduled 1st case in the morning. If your diabetes requires high dose insulin, take only  of your normal insulin dose the morning of the procedure and notify the staff that you have done so. . Preventing infections: Shower with an antibacterial soap the morning of your procedure. . Build-up your immune system: Take 1000 mg of Vitamin C with every meal (3 times a day) the day prior to your procedure. Marland Kitchen Antibiotics: Inform the staff if you have a condition or reason that requires you to take antibiotics before dental procedures. . Pregnancy: If you are pregnant, call and cancel the procedure. . Sickness: If you have a cold, fever, or any active infections, call and cancel the procedure. . Arrival: You must be in the facility at least 30 minutes prior to your scheduled procedure. . Children: Do not bring children with you. . Dress appropriately:  Bring dark clothing that you would not mind if they get stained. . Valuables: Do not bring any jewelry or valuables.  Reasons to call and reschedule or cancel your procedure: (Following these recommendations will minimize the risk of a serious complication.) . Surgeries: Avoid having procedures within 2 weeks of any surgery. (Avoid for 2 weeks before or after any surgery). . Flu Shots: Avoid having procedures within 2 weeks of a flu shots or . (Avoid for 2 weeks before or after immunizations). . Barium: Avoid having a procedure within 7-10 days after having had a radiological study involving the use of radiological contrast. (Myelograms, Barium swallow or enema study). . Heart attacks: Avoid any elective procedures or surgeries for the initial 6 months after a "Myocardial Infarction" (Heart Attack). . Blood thinners: It is imperative that you stop these medications before procedures. Let us know if you if you take any blood thinner.  . Infection: Avoid procedures during or within two weeks of an infection (including chest colds or gastrointestinal problems). Symptoms associated with infections include: Localized redness, fever, chills, night sweats or profuse sweating, burning sensation when voiding, cough, congestion, stuffiness, runny nose, sore throat, diarrhea, nausea, vomiting, cold or Flu symptoms, recent or current infections. It is specially important if the infection is over the area that we intend to treat. Marland Kitchen Heart and lung problems: Symptoms that may suggest an active cardiopulmonary problem include: cough, chest pain, breathing difficulties or shortness of breath, dizziness, ankle swelling, uncontrolled high or unusually low blood pressure, and/or palpitations. If you are experiencing any of these symptoms, cancel your procedure and contact your primary care physician for an evaluation.  Remember:  Regular Business hours are:  Monday to Thursday 8:00 AM to 4:00 PM  Provider's  Schedule: Milinda Pointer, MD:  Procedure days: Tuesday and Thursday 7:30 AM to 4:00 PM  Gillis Santa, MD:  Procedure days: Monday and Wednesday 7:30 AM to 4:00 PM ____________________________________________________________________________________________   ____________________________________________________________________________________________  General Risks and Possible Complications  Patient Responsibilities: It is important that you read this as it is part of your informed consent. It is our duty to inform you of the risks and possible complications associated with treatments offered to you. It is your responsibility as a patient to read this and to ask questions about anything that is not clear or that you believe was not covered in this document.  Patient's Rights: You have the right to refuse treatment. You also have the right to change your mind, even after initially having agreed to have the treatment done. However, under this last option, if you wait until the last second to change your mind, you may be charged for the materials used up to that point.  Introduction: Medicine is not an Chief Strategy Officer. Everything in Medicine, including the lack of treatment(s), carries the potential for danger, harm, or loss (which is by definition: Risk). In Medicine, a complication is a secondary problem, condition, or disease that can aggravate an already existing one. All treatments carry the risk of possible complications. The fact that a side effects or complications occurs, does not imply that the treatment was conducted incorrectly. It must be clearly understood that these can happen even when everything is done following the highest safety standards.  No treatment: You can choose not to proceed with the proposed treatment alternative. The "PRO(s)" would include: avoiding the risk of complications associated with the therapy. The "CON(s)" would include: not getting any of the treatment  benefits. These benefits fall under one of three categories: diagnostic; therapeutic; and/or palliative. Diagnostic benefits include: getting information which can ultimately lead to improvement of the disease or symptom(s). Therapeutic benefits are those associated with the successful treatment of the disease. Finally, palliative benefits are those related to the decrease of the primary symptoms, without necessarily curing the condition (example: decreasing the pain from a flare-up of a chronic condition, such as incurable terminal cancer).  General Risks and Complications: These are associated to most interventional treatments. They can occur alone, or in combination. They fall under one of the following six (6) categories: no benefit or worsening of symptoms; bleeding; infection; nerve damage; allergic reactions; and/or death. 1. No benefits or worsening of symptoms: In Medicine there are no guarantees, only probabilities. No healthcare provider can ever guarantee that a medical treatment will work, they can only state the probability that it may. Furthermore, there is always the possibility that the condition may worsen, either directly, or indirectly, as a consequence of the treatment. 2. Bleeding: This is more common if the patient is taking a blood thinner, either prescription or over the counter (example: Goody Powders, Fish oil, Aspirin, Garlic, etc.), or if suffering a condition associated with impaired coagulation (example: Hemophilia, cirrhosis of the liver, low platelet counts, etc.). However, even if you do not have one on these, it can still happen. If you have any of these conditions, or take one of these drugs, make sure to notify your treating physician. 3. Infection: This is more common in patients with a compromised immune system, either due to disease (example: diabetes, cancer, human immunodeficiency virus [HIV], etc.), or due to medications or treatments (example: therapies used to treat  cancer and  rheumatological diseases). However, even if you do not have one on these, it can still happen. If you have any of these conditions, or take one of these drugs, make sure to notify your treating physician. 4. Nerve Damage: This is more common when the treatment is an invasive one, but it can also happen with the use of medications, such as those used in the treatment of cancer. The damage can occur to small secondary nerves, or to large primary ones, such as those in the spinal cord and brain. This damage may be temporary or permanent and it may lead to impairments that can range from temporary numbness to permanent paralysis and/or brain death. 5. Allergic Reactions: Any time a substance or material comes in contact with our body, there is the possibility of an allergic reaction. These can range from a mild skin rash (contact dermatitis) to a severe systemic reaction (anaphylactic reaction), which can result in death. 6. Death: In general, any medical intervention can result in death, most of the time due to an unforeseen complication. ____________________________________________________________________________________________  Facet Blocks Patient Information  Description: The facets are joints in the spine between the vertebrae.  Like any joints in the body, facets can become irritated and painful.  Arthritis can also effect the facets.  By injecting steroids and local anesthetic in and around these joints, we can temporarily block the nerve supply to them.  Steroids act directly on irritated nerves and tissues to reduce selling and inflammation which often leads to decreased pain.  Facet blocks may be done anywhere along the spine from the neck to the low back depending upon the location of your pain.   After numbing the skin with local anesthetic (like Novocaine), a small needle is passed onto the facet joints under x-ray guidance.  You may experience a sensation of pressure while this is  being done.  The entire block usually lasts about 15-25 minutes.   Conditions which may be treated by facet blocks:   Low back/buttock pain  Neck/shoulder pain  Certain types of headaches  Preparation for the injection:  1. Do not eat any solid food or dairy products within 8 hours of your appointment. 2. You may drink clear liquid up to 3 hours before appointment.  Clear liquids include water, black coffee, juice or soda.  No milk or cream please. 3. You may take your regular medication, including pain medications, with a sip of water before your appointment.  Diabetics should hold regular insulin (if taken separately) and take 1/2 normal NPH dose the morning of the procedure.  Carry some sugar containing items with you to your appointment. 4. A driver must accompany you and be prepared to drive you home after your procedure. 5. Bring all your current medications with you. 6. An IV may be inserted and sedation may be given at the discretion of the physician. 7. A blood pressure cuff, EKG and other monitors will often be applied during the procedure.  Some patients may need to have extra oxygen administered for a short period. 8. You will be asked to provide medical information, including your allergies and medications, prior to the procedure.  We must know immediately if you are taking blood thinners (like Coumadin/Warfarin) or if you are allergic to IV iodine contrast (dye).  We must know if you could possible be pregnant.  Possible side-effects:   Bleeding from needle site  Infection (rare, may require surgery)  Nerve injury (rare)  Numbness & tingling (temporary)  Difficulty  urinating (rare, temporary)  Spinal headache (a headache worse with upright posture)  Light-headedness (temporary)  Pain at injection site (serveral days)  Decreased blood pressure (rare, temporary)  Weakness in arm/leg (temporary)  Pressure sensation in back/neck (temporary)   Call if you  experience:   Fever/chills associated with headache or increased back/neck pain  Headache worsened by an upright position  New onset, weakness or numbness of an extremity below the injection site  Hives or difficulty breathing (go to the emergency room)  Inflammation or drainage at the injection site(s)  Severe back/neck pain greater than usual  New symptoms which are concerning to you  Please note:  Although the local anesthetic injected can often make your back or neck feel good for several hours after the injection, the pain will likely return. It takes 3-7 days for steroids to work.  You may not notice any pain relief for at least one week.  If effective, we will often do a series of 2-3 injections spaced 3-6 weeks apart to maximally decrease your pain.  After the initial series, you may be a candidate for a more permanent nerve block of the facets.  If you have any questions, please call #[phone]) [facility]

## 2019-12-23 NOTE — Progress Notes (Signed)
PROVIDER NOTE: Information contained herein reflects review and annotations entered in association with encounter. Interpretation of such information and data should be left to medically-trained personnel. Information provided to patient can be located elsewhere in the medical record under "Patient Instructions". Document created using STT-dictation technology, any transcriptional errors that may result from process are unintentional.    Patient: Sharon Lawrence  Service Category: E/M  Provider: Gaspar Cola, MD  DOB: 1965/07/29  DOS: 12/23/2019  Specialty: Interventional Pain Management  MRN: [id number]  Setting: Ambulatory outpatient  PCP: Vidal Schwalbe, MD  Type: Established Patient    Referring Provider: Vidal Schwalbe, MD  Location: Office  Delivery: Face-to-face     HPI  Sharon Lawrence, a 54 y.o. year old female, is here today because of her Chronic pain syndrome [G89.4]. Sharon Lawrence primary complain today is Back Pain (bilateral right is worse ) Last encounter: My last encounter with her was on 12/09/2019. Pertinent problems: Sharon Lawrence has Bilateral lower extremity edema; Back muscle spasm; Right-sided low back pain with sciatica; Other chronic pain; Migraine; Chronic low back pain (Primary Area of Pain) (Bilateral) (R>L) w/ sciatica (Right); Chronic lower extremity pain (Secondary Area of Pain) (Right); Chronic sacroiliac joint pain (Bilateral) (L>R); Chronic pain syndrome; Abnormal MRI, lumbar spine (07/16/2016); DDD (degenerative disc disease), lumbar; DDD (degenerative disc disease), thoracic; Lumbar facet arthropathy (Bilateral); Lumbar facet syndrome (Bilateral); Neurogenic pain; Chronic musculoskeletal pain; Polyarthralgia; Spondylosis without myelopathy or radiculopathy, lumbosacral region; Chronic low back pain (Bilateral) w/o sciatica; and Lumbar facet synovial cyst (L5-S1) (Left) on their pertinent problem list. Pain Assessment: Severity of Chronic pain is reported as a 8 /10. Location:  Back Lower, Left, Right/down the back of right leg to the knee. Onset: More than a month ago. Quality: Discomfort, Constant, Throbbing, Spasm, Aching, Tingling (stinging). Timing: Constant. Modifying factor(s): rest, heat and pain medications. Vitals:  height is 5' 2"  (1.575 m) and weight is 198 lb (89.8 kg). Her temporal temperature is 97.7 F (36.5 C). Her blood pressure is 130/72 and her pulse is 66. Her respiration is 16 and oxygen saturation is 99%.   Reason for encounter: both, medication management and post-procedure assessment.  Since this was the patient's first procedure, I took the time to go over their way that we interpret the procedures.  I explained to her about the steroids primarily giving [place] information about inflammation while the local anesthetics gives's information about the origin of the pain.  I also reminded the patient that we switched type or local anesthetic that we use so as to confirm the accuracy of the report from the patient and we also talked today about expectations and how they are depending on what type of procedure were doing.  In the case of the Lumbar facet blocks, we are looking at what type of benefit she attained in the lower back and not necessarily the legs.  She confessed that when she was initially looking at the results of the procedure, she had included the lower extremities and the assessment.  Now that she understands better how the process works, she has gone over it again and she has provided [place] with some very valuable information.  In a nutshell, she indicated that she attained 100% relief of the pain that lasted until nighttime.  After that, she started feeling some of the pain as the localized that it wore off and she also indicated having experienced some tightening of the back muscles, which she realized this is secondary to  having gone through those muscles with the needles.  She also indicated that her leg pain, while the back was numb, it was also  completely gone.  She has no pain in the left lower extremity and the only pain that she has in the right is through the back of the leg into the buttocks area that goes further down into the back of the knee when the pain worsens.  She also indicated that this pain remains in the back of the leg and does not curve into the lateral or medial aspect of the leg and certainly does not go to the anterior aspect of the leg.  She also denies any pain from the knee down.  Again my assessment on this is that this pain is likely to be referred from the lumbar facets.  Once we went over all of this, we then moved onto what the next step would be.  I explained to her that we would be repeating the block and then looking at the results.  If she again gets the same type of result, I would confirm that were dealing with a Lumbar facet syndrome with little or no inflammatory process associated with it, suggesting that were dealing with a purely mechanical problem.  Should that be the case, then we would be considering the possibility of radiofrequency ablation as an option for the treatment of this pain, for the purpose of hopefully attaining long-term benefit.  In addition to the above, we also talked about her medications and she indicated that she still having some problems with the morphine where it makes her feel nauseous.  She indicated that while she was on the oxycodone she was not having any problems with it and therefore today we will be looking at the possibility of going back to that oxycodone assuming that her pharmacy can secure enough medicine for her.  RTCB: 03/16/2020 Nonopioids transferred on 12/23/2019: Baclofen and Lyrica.  Post-Procedure Evaluation  Procedure (12/09/2019): Diagnostic bilateral lumbar facet block #1 under fluoroscopic guidance and IV sedation Pre-procedure pain level: 8/10 Post-procedure: 0/10 (100% relief)  Sedation: Sedation provided.  Effectiveness during initial hour after  procedure(Ultra-Short Term Relief): 100 %.  Local anesthetic used: Long-acting (4-6 hours) Effectiveness: Defined as any analgesic benefit obtained secondary to the administration of local anesthetics. This carries significant diagnostic value as to the etiological location, or anatomical origin, of the pain. Duration of benefit is expected to coincide with the duration of the local anesthetic used.  Effectiveness during initial 4-6 hours after procedure(Short-Term Relief): 50 % (possible spasms.).  Long-term benefit: Defined as any relief past the pharmacologic duration of the local anesthetics.  Effectiveness past the initial 6 hours after procedure(Long-Term Relief): 50 % (back pain was better but continued to have pain in right leg.  day after procedure pain was back.).  Current benefits: Defined as benefit that persist at this time.   Analgesia:  50% improved Function: Somewhat improved ROM: Somewhat improved  Pharmacotherapy Assessment   Analgesic: Oxycodone IR 5 mg every 6 hours (20 mg/day of oxycodone) MME/day:64m/day.   Monitoring: [place] PMP: PDMP reviewed during this encounter.       Pharmacotherapy: No side-effects or adverse reactions reported. Compliance: No problems identified. Effectiveness: Clinically acceptable.  No notes on file  UDS:  Summary  Date Value Ref Range Status  11/11/2019 Note  Final    Comment:    ==================================================================== [employer] Select 13 (MW) ==================================================================== Test  Result       Flag       Units  Drug Present and Declared for Prescription Verification   Morphine                       4793         EXPECTED   ng/mg creat    Potential sources of large amounts of morphine in the absence of    codeine include administration of morphine or use of heroin.  Drug Absent but Declared for Prescription Verification   Clonazepam                      Not Detected UNEXPECTED ng/mg creat ==================================================================== Test                      Result    Flag   Units      Ref Range   Creatinine              45               mg/dL      >=20 ==================================================================== Declared Medications:  The flagging and interpretation on this report are based on the  following declared medications.  Unexpected results may arise from  inaccuracies in the declared medications.   **Note: The testing scope of this panel includes these medications:   Clonazepam (Klonopin)  Morphine (MS Contin)   **Note: The testing scope of this panel does not include the  following reported medications:   Aspirin  Atorvastatin  Baclofen  Cyanocobalamin  Duloxetine (Cymbalta)  Famotidine  Folic Acid  Ibuprofen  Levonorgestrel (Mirena)  Metoprolol  Nitroglycerin (Nitrostat)  Nystatin  Ondansetron  Pantoprazole  Pregabalin (Lyrica)  Probiotic  Promethazine  Torsemide  Vitamin D2 (Ergocalciferol) ==================================================================== For clinical consultation, please call [phone]. ====================================================================      ROS  Constitutional: Denies any fever or chills Gastrointestinal: No reported hemesis, hematochezia, vomiting, or acute GI distress Musculoskeletal: Denies any acute onset joint swelling, redness, loss of ROM, or weakness Neurological: No reported episodes of acute onset apraxia, aphasia, dysarthria, agnosia, amnesia, paralysis, loss of coordination, or loss of consciousness  Medication Review  Cyanocobalamin, DULoxetine, HYDROcodone-homatropine, Vitamin D (Ergocalciferol), aspirin, atorvastatin, baclofen, clonazePAM, famotidine, fluticasone, folic acid, ibuprofen, lactobacillus acidophilus, levonorgestrel, methocarbamol, metoprolol succinate, metoprolol tartrate, morphine,  nitroGLYCERIN, nystatin, ondansetron, oxyCODONE, pantoprazole, pregabalin, promethazine, and torsemide  History Review  Allergy: Ms. Lacko is allergic to bee venom, erythromycin, cephalexin, neomycin-bacitracin-polymyxin  [bacitracin-neomycin-polymyxin], neosporin original [bacitracin-neomycin-polymyxin], prednisone, sulfa antibiotics, tobrex [tobramycin], tramadol, and penicillins. Drug: Ms. Kalka  reports no history of drug use. Alcohol:  reports no history of alcohol use. Tobacco:  reports that she quit smoking about 2 years ago. Her smoking use included cigarettes. She has a 3.00 pack-year smoking history. She has never used smokeless tobacco. Social: Ms. Dunsworth  reports that she quit smoking about 2 years ago. Her smoking use included cigarettes. She has a 3.00 pack-year smoking history. She has never used smokeless tobacco. She reports that she does not drink alcohol and does not use drugs. Medical:  has a past medical history of ADHD (attention deficit hyperactivity disorder), Anxiety, B12 deficiency (02/07/2011), Back pain, CAD (coronary artery disease), Celiac disease, Folic acid deficiency (02/07/2011), Headache, Iron deficiency anemia (02/07/2011), and Myocardial infarction ([place]) (09/15/2017). Surgical: Ms. Audia  has a past surgical history that includes Tonsillectomy; Cholecystectomy; Knee cartilage surgery (Right); LEFT HEART CATH AND CORONARY ANGIOGRAPHY (N/A,  09/15/2017); CORONARY STENT INTERVENTION (Right, 09/15/2017); and Colonoscopy with propofol (N/A, 09/26/2017). Family: family history includes Anxiety disorder in her sister; Bipolar disorder in her son; CAD in her maternal grandfather and paternal grandmother; Cancer in her mother and sister; Cirrhosis in her paternal grandfather; Depression in her sister; Diabetes in her paternal grandmother; Drug abuse in her son; Hypertension in her father and mother; Hypothyroidism in her father and paternal grandmother.  Laboratory Chemistry Profile    Renal Lab Results  Component Value Date   BUN 18 09/16/2019   CREATININE 0.55 09/16/2019   GFRAA >60 09/16/2019   GFRNONAA >60 09/16/2019     Hepatic Lab Results  Component Value Date   AST 58 (H) 09/16/2019   ALT 71 (H) 09/16/2019   ALBUMIN 3.7 09/16/2019   ALKPHOS 116 09/16/2019     Electrolytes Lab Results  Component Value Date   NA 138 09/16/2019   K 4.2 09/16/2019   CL 104 09/16/2019   CALCIUM 8.4 (L) 09/16/2019   MG 1.9 12/03/2017     Bone Lab Results  Component Value Date   VD25OH 17.25 (L) 09/16/2019   [id number] 47 04/25/2009   [id number] 47 04/25/2009   [id number]  04/25/2009    <8 (NOTE) Vitamin D3, 1,25(OH)2 indicates both endogenous production and supplementation.  Vitamin D2, 1,25(OH)2 is an indicator of exogeous sources, such as diet or supplementation.  Interpretation and therapy are based on measurement of Vitamin  D,1,25(OH)2, Total. This test was developed and its performance characteristics have been determined by [facility], [place], [place]. Performance characteristics refer to the analytical performance of the test.     Inflammation (CRP: Acute Phase) (ESR: Chronic Phase) Lab Results  Component Value Date   CRP <0.8 09/23/2018   ESRSEDRATE 7 09/23/2018       Note: Above Lab results reviewed.  Recent Imaging Review  DG PAIN CLINIC C-ARM 1-60 MIN NO REPORT Fluoro was used, but no Radiologist interpretation will be provided.  Please refer to "NOTES" tab for provider progress note. Note: Reviewed        Physical Exam  General appearance: Well nourished, well developed, and well hydrated. In no apparent acute distress Mental status: Alert, oriented x 3 (person, place, & time)       Respiratory: No evidence of acute respiratory distress Eyes: PERLA Vitals: BP 130/72 (BP Location: Right Arm, Patient Position: Sitting, Cuff Size: Normal)   Pulse 66   Temp 97.7 F (36.5 C) (Temporal)   Resp 16   Ht 5' 2"  (1.575 m)    Wt 198 lb (89.8 kg)   SpO2 99%   BMI 36.21 kg/m  BMI: Estimated body mass index is 36.21 kg/m as calculated from the following:   Height as of this encounter: 5' 2"  (1.575 m).   Weight as of this encounter: 198 lb (89.8 kg). Ideal: Ideal body weight: 50.1 kg (110 lb 7.2 oz) Adjusted ideal body weight: 66 kg (145 lb 7.5 oz)  Assessment   Status Diagnosis  Persistent Improving Persistent 1. Chronic pain syndrome   2. Chronic low back pain (Primary Area of Pain) (Bilateral) (R>L) w/ sciatica (Right)   3. Chronic lower extremity pain (Secondary Area of Pain) (Right)   4. Pharmacologic therapy   5. Back muscle spasm   6. Chronic musculoskeletal pain   7. Neurogenic pain   8. Lumbar facet syndrome (Bilateral)      Updated Problems: No problems updated.  Plan of Care  Problem-specific:  No problem-specific Assessment &  Plan notes found for this encounter.  Sharon Lawrence has a current medication list which includes the following long-term medication(s): atorvastatin, clonazepam, duloxetine, duloxetine, fluticasone, levonorgestrel, metoprolol succinate, morphine, [START ON 01/16/2020] morphine, nitroglycerin, pantoprazole, promethazine, torsemide, baclofen, metoprolol tartrate, [START ON 01/16/2020] oxycodone, and [START ON 01/23/2020] pregabalin.  Pharmacotherapy (Medications Ordered): Meds ordered this encounter  Medications  . pregabalin (LYRICA) 75 MG capsule    Sig: Take 1 capsule (75 mg total) by mouth 3 (three) times daily.    Dispense:  90 capsule    Refill:  2    Fill one day early if pharmacy is closed on scheduled refill date. May substitute for generic if available.  Marland Kitchen oxyCODONE (OXY IR/ROXICODONE) 5 MG immediate release tablet    Sig: Take 1 tablet (5 mg total) by mouth every 6 (six) hours as needed for severe pain. Must last 30 days.    Dispense:  120 tablet    Refill:  0    Chronic Pain: STOP Act (Not applicable) Fill 1 day early if closed on refill date. Avoid  benzodiazepines within 8 hours of opioids   Orders:  Orders Placed This Encounter  Procedures  . LUMBAR FACET(MEDIAL BRANCH NERVE BLOCK) MBNB    Standing Status:   Future    Standing Expiration Date:   01/23/2020    Scheduling Instructions:     Procedure: Lumbar facet block (AKA.: Lumbosacral medial branch nerve block)     Side: Bilateral     Level: L3-4, L4-5, & L5-S1 Facets (L2, L3, L4, L5, & S1 Medial Branch Nerves)     Sedation: Patient's choice.     Timeframe: ASAA    Order Specific Question:   Where will this procedure be performed?    Answer:   [facility]   Follow-up plan:   Return for Procedure (w/ sedation): (B) L-FCT BLK #2.      Interventional management options:  Planned, scheduled, and/or pending:       Considering:   Possible bilateral lumbar facet RFA  Diagnostic SI joint block  Possible SI joint RFA    PRN Procedures:   Diagnostic bilateral lumbar facet block #2     Recent Visits Date Type Provider Dept  12/09/19 Procedure visit Milinda Pointer, MD [facility]  11/11/19 Office Visit Milinda Pointer, MD [facility]  10/18/19 Office Visit Milinda Pointer, MD [facility]  Showing recent visits within past 90 days and meeting all other requirements Today's Visits Date Type Provider Dept  12/23/19 Office Visit Milinda Pointer, MD [facility]  Showing today's visits and meeting all other requirements Future Appointments Date Type Provider Dept  02/14/20 Appointment Milinda Pointer, MD [facility]  Showing future appointments within next 90 days and meeting all other requirements  I discussed the assessment and treatment plan with the patient. The patient was provided an opportunity to ask questions and all were answered. The patient agreed with the plan and demonstrated an understanding of the instructions.  Patient advised to call back or seek an in-person evaluation if the symptoms  or condition worsens.  Duration of encounter: 50 minutes.  Note by: Gaspar Cola, MD Date: 12/23/2019; Time: 4:06 PM

## 2019-12-30 ENCOUNTER — Ambulatory Visit (HOSPITAL_COMMUNITY): Payer: BC Managed Care – PPO | Admitting: Oncology

## 2020-01-04 ENCOUNTER — Ambulatory Visit (INDEPENDENT_AMBULATORY_CARE_PROVIDER_SITE_OTHER): Payer: BC Managed Care – PPO | Admitting: Cardiology

## 2020-01-04 ENCOUNTER — Encounter: Payer: Self-pay | Admitting: Cardiology

## 2020-01-04 ENCOUNTER — Other Ambulatory Visit (HOSPITAL_COMMUNITY): Payer: BC Managed Care – PPO

## 2020-01-04 ENCOUNTER — Other Ambulatory Visit: Payer: Self-pay

## 2020-01-04 VITALS — BP 138/76 | HR 100 | Ht 60.0 in | Wt 236.6 lb

## 2020-01-04 DIAGNOSIS — R079 Chest pain, unspecified: Secondary | ICD-10-CM | POA: Diagnosis not present

## 2020-01-04 DIAGNOSIS — I2510 Atherosclerotic heart disease of native coronary artery without angina pectoris: Secondary | ICD-10-CM | POA: Diagnosis not present

## 2020-01-04 NOTE — Patient Instructions (Signed)
Medication Instructions:  Your physician recommends that you continue on your current medications as directed. Please refer to the Current Medication list given to you today.  *If you need a refill on your cardiac medications before your next appointment, please call your pharmacy*   Lab Work: None today  If you have labs (blood work) drawn today and your tests are completely normal, you will receive your results only by: Marland Kitchen MyChart Message (if you have MyChart) OR . A paper copy in the mail If you have any lab test that is abnormal or we need to change your treatment, we will call you to review the results.   Testing/Procedures: Your physician has requested that you have an echocardiogram. Echocardiography is a painless test that uses sound waves to create images of your heart. It provides your doctor with information about the size and shape of your heart and how well your heart's chambers and valves are working. This procedure takes approximately one hour. There are no restrictions for this procedure.     Follow-Up: At [facility], you and your health needs are our priority.  As part of our continuing mission to provide you with exceptional heart care, we have created designated Provider Care Teams.  These Care Teams include your primary Cardiologist (physician) and Advanced Practice Providers (APPs -  Physician Assistants and Nurse Practitioners) who all work together to provide you with the care you need, when you need it.  We recommend signing up for the patient portal called "MyChart".  Sign up information is provided on this After Visit Summary.  MyChart is used to connect with patients for Virtual Visits (Telemedicine).  Patients are able to view lab/test results, encounter notes, upcoming appointments, etc.  Non-urgent messages can be sent to your provider as well.   To learn more about what you can do with MyChart, go to [email or web address].    Your next appointment:   1  month(s)  The format for your next appointment:   In Person  Provider:   Bernerd Pho, PA-C or Ermalinda Barrios, PA-C   Other Instructions None     Thank you for choosing [facility] !

## 2020-01-04 NOTE — Progress Notes (Signed)
Clinical Summary Sharon Lawrence is a 54 y.o.female  seen today for follow up of the following medical problems.  1. CAD - 09/2017 admitted with STEMI, receved PCI to RCA. LVgram 55-65%.  - brillinta changed to plavix due to hematochezia. Lopressor stopped at that time due to soft bp's but was restarted at f/u. Has not been on ACE-I  -  Soft bp's limit medical therapy.   - some recent chest pains - midchest squeezing like feeling, can go into right shoulder. 2/10 in severity, lasts a few minutes. Better with NG. No other associated symptoms. Can occur or with activity. Not positional. Sporadic, varies in frequency. Last episode was last week. No change in frequency - no SOb/DOE - mixed compliance with meds - symptoms started late Spring - increased stress  2. LE edema - long history, taking torsemide regularly - if misses torsemide swelling can become severe, can have some SOB/DOE   3.History of  Hematochezia - admission 09/2017 - brillinta was changed to plavix during that admission, no recurrent issues.   3. Hyperlipidemia - limiting statin dosing due to previous elevated LFTs - last LFTs nearly normal    SH: nurse, works at [employer] center   Past Medical History:  Diagnosis Date  . ADHD (attention deficit hyperactivity disorder)   . Anxiety   . B12 deficiency 02/07/2011  . Back pain   . CAD (coronary artery disease)    a. s/p recent STEMI on 09/15/2017 with DES to RCA  . Celiac disease   . Folic acid deficiency 43/05/2949  . Headache   . Iron deficiency anemia 02/07/2011  . Myocardial infarction ([place]) 09/15/2017     Allergies  Allergen Reactions  . Bee Venom Anaphylaxis  . Erythromycin Anaphylaxis  . Cephalexin   . Neomycin-Bacitracin-Polymyxin  [Bacitracin-Neomycin-Polymyxin] Swelling    Sight of application  . Neosporin Original [Bacitracin-Neomycin-Polymyxin] Swelling    Swelling at site of application  . Prednisone   . Sulfa  Antibiotics Swelling    Sulfa eye drops caused eyes to swell Sulfa eye drops caused eyes to swell  . Tobrex [Tobramycin] Swelling  . Tramadol Hives  . Penicillins Rash    Has patient had a PCN reaction causing immediate rash, facial/tongue/throat swelling, SOB or lightheadedness with hypotension: Yes Has patient had a PCN reaction causing severe rash involving mucus membranes or skin necrosis: No Has patient had a PCN reaction that required hospitalization: No Has patient had a PCN reaction occurring within the last 10 years: No If all of the above answers are "NO", then may proceed with Cephalosporin use.      Current Outpatient Medications  Medication Sig Dispense Refill  . aspirin 81 MG chewable tablet Chew 1 tablet (81 mg total) by mouth daily.    Marland Kitchen atorvastatin (LIPITOR) 40 MG tablet TAKE 1 TABLET BY MOUTH ONCE DAILY AT 6PM. 30 tablet 6  . baclofen (LIORESAL) 20 MG tablet Take 1 tablet (20 mg total) by mouth daily. (Patient not taking: Reported on 12/23/2019) 30 tablet 5  . clonazePAM (KLONOPIN) 0.5 MG tablet Take 0.5-1 tablets (0.25-0.5 mg total) by mouth as directed. 1-2 times a week as needed for severe panic sx. 12 tablet 1  . Cyanocobalamin (VITAMIN B-12 IJ) Inject as directed every 30 (thirty) days.     . DULoxetine (CYMBALTA) 20 MG capsule TAKE (1) CAPSULE BY MOUTH ONCE DAILY. *TAKE WITH 60MG CAP* 90 capsule 0  . DULoxetine (CYMBALTA) 60 MG capsule TAKE (1) CAPSULE BY MOUTH  ONCE DAILY. 90 capsule 0  . famotidine (PEPCID) 40 MG tablet daily.     . fluticasone (FLONASE) 50 MCG/ACT nasal spray Place 1 spray into both nostrils daily. As needed.    . folic acid (FOLVITE) 1 MG tablet Take 1 mg by mouth 2 (two) times a day.    Marland Kitchen HYCODAN 5-1.5 MG/5ML syrup Take 1.5 mLs by mouth as needed.    Marland Kitchen ibuprofen (ADVIL,MOTRIN) 800 MG tablet 800 mg every 6 (six) hours as needed.     . lactobacillus acidophilus (BACID) TABS tablet Take 1 tablet by mouth daily.    Marland Kitchen levonorgestrel (MIRENA) 20  MCG/24HR IUD by Intrauterine route.    . methocarbamol (ROBAXIN) 500 MG tablet Take 500 mg by mouth 4 (four) times daily as needed.    . metoprolol succinate (TOPROL-XL) 25 MG 24 hr tablet Take 25 mg by mouth in the morning and at bedtime.    . metoprolol tartrate (LOPRESSOR) 25 MG tablet Take 1 tablet (25 mg total) by mouth 2 (two) times daily. 180 tablet 3  . morphine (MS CONTIN) 15 MG 12 hr tablet Take 1 tablet (15 mg total) by mouth every 12 (twelve) hours. Must last 30 days. Do not break tablet 60 tablet 0  . [START ON 01/16/2020] morphine (MS CONTIN) 15 MG 12 hr tablet Take 1 tablet (15 mg total) by mouth every 12 (twelve) hours. Must last 30 days. Do not break tablet 60 tablet 0  . nitroGLYCERIN (NITROSTAT) 0.4 MG SL tablet Place 1 tablet (0.4 mg total) under the tongue every 5 (five) minutes as needed for chest pain. 25 tablet 2  . nystatin (MYCOSTATIN/NYSTOP) powder Apply topically.    . ondansetron (ZOFRAN-ODT) 8 MG disintegrating tablet Take 1 tablet by mouth 3 (three) times daily as needed for nausea.   3  . [START ON 01/16/2020] oxyCODONE (OXY IR/ROXICODONE) 5 MG immediate release tablet Take 1 tablet (5 mg total) by mouth every 6 (six) hours as needed for severe pain. Must last 30 days. 120 tablet 0  . pantoprazole (PROTONIX) 40 MG tablet TAKE 1 TABLET BY MOUTH ONCE DAILY BEFORE BREAKFAST. 30 tablet 6  . [START ON 01/23/2020] pregabalin (LYRICA) 75 MG capsule Take 1 capsule (75 mg total) by mouth 3 (three) times daily. 90 capsule 2  . promethazine (PHENERGAN) 25 MG tablet Take 25 mg by mouth 3 (three) times daily as needed for nausea.   2  . torsemide (DEMADEX) 20 MG tablet 20 mg once. As needed.  2  . Vitamin D, Ergocalciferol, (DRISDOL) 1.25 MG (50000 UT) CAPS capsule 1 cap weekly 12 weeks, 12 caps     No current facility-administered medications for this visit.     Past Surgical History:  Procedure Laterality Date  . CHOLECYSTECTOMY    . COLONOSCOPY WITH PROPOFOL N/A 09/26/2017    Procedure: COLONOSCOPY WITH PROPOFOL;  Surgeon: Rogene Houston, MD;  Location: [facility];  Service: Endoscopy;  Laterality: N/A;  . CORONARY STENT INTERVENTION Right 09/15/2017   Procedure: CORONARY STENT INTERVENTION;  Surgeon: Jettie Booze, MD;  Location: [facility];  Service: Cardiovascular;  Laterality: Right;  RCA  . KNEE CARTILAGE SURGERY Right   . LEFT HEART CATH AND CORONARY ANGIOGRAPHY N/A 09/15/2017   Procedure: LEFT HEART CATH AND CORONARY ANGIOGRAPHY;  Surgeon: Jettie Booze, MD;  Location: [facility];  Service: Cardiovascular;  Laterality: N/A;  . TONSILLECTOMY       Allergies  Allergen Reactions  .  Bee Venom Anaphylaxis  . Erythromycin Anaphylaxis  . Cephalexin   . Neomycin-Bacitracin-Polymyxin  [Bacitracin-Neomycin-Polymyxin] Swelling    Sight of application  . Neosporin Original [Bacitracin-Neomycin-Polymyxin] Swelling    Swelling at site of application  . Prednisone   . Sulfa Antibiotics Swelling    Sulfa eye drops caused eyes to swell Sulfa eye drops caused eyes to swell  . Tobrex [Tobramycin] Swelling  . Tramadol Hives  . Penicillins Rash    Has patient had a PCN reaction causing immediate rash, facial/tongue/throat swelling, SOB or lightheadedness with hypotension: Yes Has patient had a PCN reaction causing severe rash involving mucus membranes or skin necrosis: No Has patient had a PCN reaction that required hospitalization: No Has patient had a PCN reaction occurring within the last 10 years: No If all of the above answers are "NO", then may proceed with Cephalosporin use.       Family History  Problem Relation Age of Onset  . Cancer Mother   . Hypertension Mother   . Hypertension Father   . Hypothyroidism Father   . Cancer Sister   . Anxiety disorder Sister   . Depression Sister   . CAD Maternal Grandfather   . Cirrhosis Paternal Grandfather   . CAD Paternal Grandmother   . Diabetes Paternal Grandmother   .  Hypothyroidism Paternal Grandmother   . Drug abuse Son   . Bipolar disorder Son      Social History Sharon Lawrence reports that she quit smoking about 2 years ago. Her smoking use included cigarettes. She has a 3.00 pack-year smoking history. She has never used smokeless tobacco. Sharon Lawrence reports no history of alcohol use.   Review of Systems CONSTITUTIONAL: No weight loss, fever, chills, weakness or fatigue.  HEENT: Eyes: No visual loss, blurred vision, double vision or yellow sclerae.No hearing loss, sneezing, congestion, runny nose or sore throat.  SKIN: No rash or itching.  CARDIOVASCULAR: per hpi RESPIRATORY: per hpi GASTROINTESTINAL: No anorexia, nausea, vomiting or diarrhea. No abdominal pain or blood.  GENITOURINARY: No burning on urination, no polyuria NEUROLOGICAL: No headache, dizziness, syncope, paralysis, ataxia, numbness or tingling in the extremities. No change in bowel or bladder control.  MUSCULOSKELETAL: No muscle, back pain, joint pain or stiffness.  LYMPHATICS: No enlarged nodes. No history of splenectomy.  PSYCHIATRIC: No history of depression or anxiety.  ENDOCRINOLOGIC: No reports of sweating, cold or heat intolerance. No polyuria or polydipsia.  Marland Kitchen   Physical Examination Today's Vitals   01/04/20 1412  BP: 138/76  Pulse: 100  SpO2: 99%  Weight: 236 lb 9.6 oz (107.3 kg)  Height: 5' (1.524 m)   Body mass index is 46.21 kg/m.  Gen: resting comfortably, no acute distress HEENT: no scleral icterus, pupils equal round and reactive, no palptable cervical adenopathy,  CV: RRR, no m/r/g, no jvd Resp: Clear to auscultation bilaterally GI: abdomen is soft, non-tender, non-distended, normal bowel sounds, no hepatosplenomegaly MSK: extremities are warm, 1+ bilateral LE edema Skin: warm, no rash Neuro:  no focal deficits Psych: appropriate affect   Diagnostic Studies  09/2017 cath  Prox RCA lesion is 80% stenosed. It appeared to be a ruptured plaque.  A  drug-eluting stent was successfully placed using a STENT SYNERGY DES [id number].  Post intervention, there is a 0% residual stenosis.  The left ventricular systolic function is normal.  LV end diastolic pressure is low.  The left ventricular ejection fraction is 55-65% by visual estimate.  Severe spasm in the right radial artery, requiring heavy  sedation and IA NTG. 4Fr diagnostic and 5 Fr Guide catheter used.   Assessment and Plan  1. CAD -recent chest pain symptoms, mixed in description but can improve with NG and somewhat similar to prior angina. Have been ongoing several months, not specifically exertional, variable in frequency - with LE edema will start with echo, pending echo findings would plan for lexiscan 2 day protocol vs cath   2. LE edema - check echo -continue torsemide.      Arnoldo Lenis, M.D.

## 2020-01-07 ENCOUNTER — Other Ambulatory Visit: Payer: Self-pay

## 2020-01-07 ENCOUNTER — Ambulatory Visit (HOSPITAL_COMMUNITY)
Admission: RE | Admit: 2020-01-07 | Discharge: 2020-01-07 | Disposition: A | Discharge status: Home / Self Care | Payer: BC Managed Care – PPO | Source: Ambulatory Visit | Attending: Cardiology | Admitting: Cardiology

## 2020-01-07 DIAGNOSIS — R079 Chest pain, unspecified: Secondary | ICD-10-CM | POA: Diagnosis not present

## 2020-01-07 LAB — ECHOCARDIOGRAM COMPLETE
AR max vel: 2.73 cm2
AV Area VTI: 2.73 cm2
AV Area mean vel: 2.55 cm2
AV Mean grad: 6.5 mmHg
AV Peak grad: 13.5 mmHg
Ao pk vel: 1.84 m/s
Area-P 1/2: 3.95 cm2
S' Lateral: 2.21 cm

## 2020-01-07 NOTE — Progress Notes (Signed)
*  PRELIMINARY RESULTS* Echocardiogram 2D Echocardiogram has been performed.  Leavy Cella 01/07/2020, 3:35 PM

## 2020-01-12 ENCOUNTER — Encounter: Payer: Self-pay | Admitting: *Deleted

## 2020-01-12 ENCOUNTER — Telehealth: Payer: Self-pay | Admitting: *Deleted

## 2020-01-12 DIAGNOSIS — R079 Chest pain, unspecified: Secondary | ICD-10-CM

## 2020-01-12 NOTE — Telephone Encounter (Signed)
-----   Message from Arnoldo Lenis, MD sent at 01/07/2020  4:53 PM EDT ----- Echo looks good, normal heart function. Can we order a lexiscan 2 day protocol for chest pain  Carlyle Dolly MD

## 2020-01-12 NOTE — Telephone Encounter (Signed)
Patient notified and order placed. 

## 2020-01-13 DIAGNOSIS — M25561 Pain in right knee: Secondary | ICD-10-CM | POA: Diagnosis not present

## 2020-01-13 DIAGNOSIS — M2391 Unspecified internal derangement of right knee: Secondary | ICD-10-CM | POA: Diagnosis not present

## 2020-01-20 DIAGNOSIS — F411 Generalized anxiety disorder: Secondary | ICD-10-CM | POA: Diagnosis not present

## 2020-01-20 DIAGNOSIS — G4700 Insomnia, unspecified: Secondary | ICD-10-CM | POA: Diagnosis not present

## 2020-01-20 DIAGNOSIS — F439 Reaction to severe stress, unspecified: Secondary | ICD-10-CM | POA: Diagnosis not present

## 2020-01-25 ENCOUNTER — Telehealth: Payer: Self-pay

## 2020-01-25 DIAGNOSIS — F330 Major depressive disorder, recurrent, mild: Secondary | ICD-10-CM

## 2020-01-25 DIAGNOSIS — F411 Generalized anxiety disorder: Secondary | ICD-10-CM

## 2020-01-25 DIAGNOSIS — F410 Panic disorder [episodic paroxysmal anxiety] without agoraphobia: Secondary | ICD-10-CM

## 2020-01-25 MED ORDER — DULOXETINE HCL 60 MG PO CPEP: ORAL_CAPSULE | ORAL | 0 refills | Status: DC

## 2020-01-25 MED ORDER — DULOXETINE HCL 20 MG PO CPEP: ORAL_CAPSULE | ORAL | 0 refills | Status: DC

## 2020-01-25 NOTE — Telephone Encounter (Signed)
pt called states she needs a refill on her medications pt states she has an appt for 12- 10 but she needs enough to get to appt. it is both cymbalta the 20 and 77m

## 2020-01-25 NOTE — Telephone Encounter (Signed)
I have sent Cymbalta to pharmacy.

## 2020-02-02 NOTE — Progress Notes (Signed)
Cardiology Clinic Note   Patient Name: Sharon Lawrence Date of Encounter: 02/03/2020  Primary Care Provider:  Vidal Schwalbe, MD Primary Cardiologist:  Carlyle Dolly, MD  Patient Profile    Sharon Lawrence 54 year old female presents to the clinic today for follow-up evaluation of Sharon Lawrence hypertension and coronary artery disease.  Past Medical History    Past Medical History:  Diagnosis Date  . ADHD (attention deficit hyperactivity disorder)   . Anxiety   . B12 deficiency 02/07/2011  . Back pain   . CAD (coronary artery disease)    a. s/p recent STEMI on 09/15/2017 with DES to RCA  . Celiac disease   . Folic acid deficiency 69/08/2950  . Headache   . Iron deficiency anemia 02/07/2011  . Myocardial infarction ([place]) 09/15/2017   Past Surgical History:  Procedure Laterality Date  . CHOLECYSTECTOMY    . COLONOSCOPY WITH PROPOFOL N/A 09/26/2017   Procedure: COLONOSCOPY WITH PROPOFOL;  Surgeon: Rogene Houston, MD;  Location: [facility];  Service: Endoscopy;  Laterality: N/A;  . CORONARY STENT INTERVENTION Right 09/15/2017   Procedure: CORONARY STENT INTERVENTION;  Surgeon: Jettie Booze, MD;  Location: [facility];  Service: Cardiovascular;  Laterality: Right;  RCA  . KNEE CARTILAGE SURGERY Right   . LEFT HEART CATH AND CORONARY ANGIOGRAPHY N/A 09/15/2017   Procedure: LEFT HEART CATH AND CORONARY ANGIOGRAPHY;  Surgeon: Jettie Booze, MD;  Location: [facility];  Service: Cardiovascular;  Laterality: N/A;  . TONSILLECTOMY      Allergies  Allergies  Allergen Reactions  . Bee Venom Anaphylaxis  . Erythromycin Anaphylaxis  . Cephalexin   . Neomycin-Bacitracin-Polymyxin  [Bacitracin-Neomycin-Polymyxin] Swelling    Sight of application  . Neosporin Original [Bacitracin-Neomycin-Polymyxin] Swelling    Swelling at site of application  . Prednisone   . Sulfa Antibiotics Swelling    Sulfa eye drops caused eyes to swell Sulfa eye drops caused eyes to swell  .  Tobrex [Tobramycin] Swelling  . Tramadol Hives  . Penicillins Rash    Has patient had a PCN reaction causing immediate rash, facial/tongue/throat swelling, SOB or lightheadedness with hypotension: Yes Has patient had a PCN reaction causing severe rash involving mucus membranes or skin necrosis: No Has patient had a PCN reaction that required hospitalization: No Has patient had a PCN reaction occurring within the last 10 years: No If all of the above answers are "NO", then may proceed with Cephalosporin use.     History of Present Illness    Sharon Lawrence has a PMH of STEMI 7/19, hypertension, benign essential hypertension, reactive airway disease, celiac's disease, GERD, DDD, hyperlipidemia, morbid obesity, and chronic pain syndrome.  Sharon Lawrence was last seen by Dr. Harl Bowie on 01/04/2020.  During that time Sharon Lawrence Lopressor continue to be held due to hypotension.  Sharon Lawrence indicated that Sharon Lawrence had some recent chest discomfort.  Sharon Lawrence described it as mid chest, squeezing, and radiating back to Sharon Lawrence right shoulder.  Sharon Lawrence indicated that it was a 2 out of 10 intensity and lasting for minutes at a time.  Sharon Lawrence symptoms were improved with nitroglycerin.  It was also described as sporadic.  Sharon Lawrence denied shortness of breath or DOE.  Sharon Lawrence symptoms and started late in the spring.  Sharon Lawrence also indicated that Sharon Lawrence is having increased stress.  An echocardiogram was ordered and showed an LVEF of 65-70%, mild LVH, normal diastolic parameters, and no valvular abnormalities.  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  Sharon Lawrence presents to the clinic today for follow-up evaluation states Sharon Lawrence feels well.  Sharon Lawrence denies anginal equivalent symptoms that Sharon Lawrence had with Sharon Lawrence previous NSTEMI.  Sharon Lawrence symptoms included nauseousness and radiating pain to Sharon Lawrence right shoulder.  When asked about previous symptoms that were evaluated and assessed on 01/04/2020 Sharon Lawrence reports epigastric/reflux type pain.  Sharon Lawrence states Sharon Lawrence continues to have 15 seconds of pain with drinking  hot drinks/cold drinks, swallowing pills, and specific types of foods.  Sharon Lawrence denies exertional type chest pain and changes with Sharon Lawrence breathing.  Sharon Lawrence echocardiogram was reviewed and Sharon Lawrence expressed understanding.  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  I recommend that Sharon Lawrence increases Sharon Lawrence physical activity, takes Sharon Lawrence atorvastatin every other day, and follows up in 6 months.  Sharon Lawrence was instructed that if Sharon Lawrence develops anginal equivalent symptoms/chest pain, changes in Sharon Lawrence breathing, or increased lower extremity edema, to contact the cardiology clinic.  Today Sharon Lawrence denies chest pain, shortness of breath, lower extremity edema, fatigue, palpitations, melena, hematuria, hemoptysis, diaphoresis, weakness, presyncope, syncope, orthopnea, and PND.   Home Medications    Prior to Admission medications   Medication Sig Start Date End Date Taking? Authorizing Provider  aspirin 81 MG chewable tablet Chew 1 tablet (81 mg total) by mouth daily. 09/18/17   Cheryln Manly, NP  atorvastatin (LIPITOR) 40 MG tablet TAKE 1 TABLET BY MOUTH ONCE DAILY AT 6PM. 10/27/17   Arnoldo Lenis, MD  clonazePAM (KLONOPIN) 0.5 MG tablet Take 0.5-1 tablets (0.25-0.5 mg total) by mouth as directed. 1-2 times a week as needed for severe panic sx. 11/24/19   Ursula Alert, MD  Cyanocobalamin (VITAMIN B-12 IJ) Inject as directed every 30 (thirty) days.     [provider]  DULoxetine (CYMBALTA) 20 MG capsule TAKE (1) CAPSULE BY MOUTH ONCE DAILY. *TAKE WITH 60MG CAP* 01/25/20   Ursula Alert, MD  DULoxetine (CYMBALTA) 60 MG capsule TAKE (1) CAPSULE BY MOUTH ONCE DAILY. 01/25/20   Ursula Alert, MD  famotidine (PEPCID) 40 MG tablet daily.  10/08/18   [provider]  fluticasone (FLONASE) 50 MCG/ACT nasal spray Place 1 spray into both nostrils daily. As needed.    [provider]  folic acid (FOLVITE) 1 MG tablet Take 1 mg by mouth 2 (two)  times a day.    [provider]  HYCODAN 5-1.5 MG/5ML syrup Take 1.5 mLs by mouth as needed. 12/17/19   [provider]  ibuprofen (ADVIL,MOTRIN) 800 MG tablet 800 mg every 6 (six) hours as needed.  04/16/18   [provider]  lactobacillus acidophilus (BACID) TABS tablet Take 1 tablet by mouth daily.    [provider]  levonorgestrel (MIRENA) 20 MCG/24HR IUD by Intrauterine route.    [provider]  methocarbamol (ROBAXIN) 500 MG tablet Take 500 mg by mouth 4 (four) times daily as needed. 12/13/19   [provider]  metoprolol tartrate (LOPRESSOR) 25 MG tablet Take 1 tablet (25 mg total) by mouth 2 (two) times daily. 01/16/18 10/18/19  Arnoldo Lenis, MD  morphine (MS CONTIN) 15 MG 12 hr tablet Take 1 tablet (15 mg total) by mouth every 12 (twelve) hours. Must last 30 days. Do not break tablet Patient not taking: Reported on 01/04/2020 12/17/19 01/16/20  Milinda Pointer, MD  morphine (MS CONTIN) 15 MG 12 hr tablet Take 1 tablet (15 mg total) by mouth every 12 (twelve) hours. Must last 30 days. Do not  break tablet 01/16/20 02/15/20  Milinda Pointer, MD  nitroGLYCERIN (NITROSTAT) 0.4 MG SL tablet Place 1 tablet (0.4 mg total) under the tongue every 5 (five) minutes as needed for chest pain. 09/17/17   Cheryln Manly, NP  nystatin (MYCOSTATIN/NYSTOP) powder Apply topically.    [provider]  ondansetron (ZOFRAN-ODT) 8 MG disintegrating tablet Take 1 tablet by mouth 3 (three) times daily as needed for nausea.  08/11/17   [provider]  oxyCODONE (OXY IR/ROXICODONE) 5 MG immediate release tablet Take 1 tablet (5 mg total) by mouth every 6 (six) hours as needed for severe pain. Must last 30 days. Patient not taking: Reported on 01/04/2020 01/16/20 02/15/20  Milinda Pointer, MD  pantoprazole (PROTONIX) 40 MG tablet TAKE 1 TABLET BY MOUTH ONCE DAILY BEFORE BREAKFAST. 10/27/17   Arnoldo Lenis, MD  pregabalin (LYRICA)  75 MG capsule Take 1 capsule (75 mg total) by mouth 3 (three) times daily. 01/23/20 04/22/20  Milinda Pointer, MD  promethazine (PHENERGAN) 25 MG tablet Take 25 mg by mouth 3 (three) times daily as needed for nausea.  08/11/17   [provider]  rizatriptan (MAXALT) 10 MG tablet Take by mouth. 12/31/19   [provider]  torsemide (DEMADEX) 20 MG tablet 20 mg once. As needed. 10/17/17   [provider]  Vitamin D, Ergocalciferol, (DRISDOL) 1.25 MG (50000 UT) CAPS capsule 1 cap weekly 12 weeks, 12 caps 09/14/18   [provider]    Family History    Family History  Problem Relation Age of Onset  . Cancer Mother   . Hypertension Mother   . Hypertension Father   . Hypothyroidism Father   . Cancer Sister   . Anxiety disorder Sister   . Depression Sister   . CAD Maternal Grandfather   . Cirrhosis Paternal Grandfather   . CAD Paternal Grandmother   . Diabetes Paternal Grandmother   . Hypothyroidism Paternal Grandmother   . Drug abuse Son   . Bipolar disorder Son    Sharon Lawrence indicated that Sharon Lawrence mother is alive. Sharon Lawrence indicated that Sharon Lawrence father is alive. Sharon Lawrence indicated that Sharon Lawrence sister is alive. Sharon Lawrence indicated that the status of Sharon Lawrence maternal grandfather is unknown. Sharon Lawrence indicated that the status of Sharon Lawrence paternal grandmother is unknown. Sharon Lawrence indicated that the status of Sharon Lawrence paternal grandfather is unknown. Sharon Lawrence indicated that the status of Sharon Lawrence son is unknown. Sharon Lawrence indicated that Sharon Lawrence child is alive. Sharon Lawrence indicated that Sharon Lawrence other is alive.  Social History    Social History   Socioeconomic History  . Marital status: Widowed    Spouse name: Not on file  . Number of children: 3  . Years of education: Not on file  . Highest education level: Associate degree: occupational, Hotel manager, or vocational program  Occupational History  . Not on file  Tobacco Use  . Smoking status: Former Smoker    Packs/day: 0.25    Years: 12.00    Pack years: 3.00    Types: Cigarettes    Quit  date: 04/04/2017    Years since quitting: 2.8  . Smokeless tobacco: Never Used  Vaping Use  . Vaping Use: Never used  Substance and Sexual Activity  . Alcohol use: No  . Drug use: No  . Sexual activity: Not Currently  Other Topics Concern  . Not on file  Social History Narrative  . Not on file   Social Determinants of Health   Financial Resource Strain:   . Difficulty of Paying Living Expenses:  Not on file  Food Insecurity:   . Worried About Charity fundraiser in the Last Year: Not on file  . Ran Out of Food in the Last Year: Not on file  Transportation Needs:   . Lack of Transportation (Medical): Not on file  . Lack of Transportation (Non-Medical): Not on file  Physical Activity:   . Days of Exercise per Week: Not on file  . Minutes of Exercise per Session: Not on file  Stress:   . Feeling of Stress : Not on file  Social Connections:   . Frequency of Communication with Friends and Family: Not on file  . Frequency of Social Gatherings with Friends and Family: Not on file  . Attends Religious Services: Not on file  . Active Member of Clubs or Organizations: Not on file  . Attends Archivist Meetings: Not on file  . Marital Status: Not on file  Intimate Partner Violence:   . Fear of Current or Ex-Partner: Not on file  . Emotionally Abused: Not on file  . Physically Abused: Not on file  . Sexually Abused: Not on file     Review of Systems    General:  No chills, fever, night sweats or weight changes.  Cardiovascular:  No chest pain, dyspnea on exertion, edema, orthopnea, palpitations, paroxysmal nocturnal dyspnea. Dermatological: No rash, lesions/masses Respiratory: No cough, dyspnea Urologic: No hematuria, dysuria Abdominal:   No nausea, vomiting, diarrhea, bright red blood per rectum, melena, or hematemesis Neurologic:  No visual changes, wkns, changes in mental status. All other systems reviewed and are otherwise negative except as noted above.  Physical  Exam    VS:  BP 138/70   Pulse (!) 112   Ht 5' (1.524 m)   Wt 226 lb (102.5 kg)   SpO2 98%   BMI 44.14 kg/m  , BMI Body mass index is 44.14 kg/m. GEN: Well nourished, well developed, in no acute distress. HEENT: normal. Neck: Supple, no JVD, carotid bruits, or masses. Cardiac: RRR, no murmurs, rubs, or gallops. No clubbing, cyanosis, edema.  Radials/DP/PT 2+ and equal bilaterally.  Respiratory:  Respirations regular and unlabored, clear to auscultation bilaterally. GI: Soft, nontender, nondistended, BS + x 4. MS: no deformity or atrophy. Skin: warm and dry, no rash. Neuro:  Strength and sensation are intact. Psych: Normal affect.  Accessory Clinical Findings    Recent Labs: 09/16/2019: ALT 71; BUN 18; Creatinine, Ser 0.55; Hemoglobin 13.0; Platelets 486; Potassium 4.2; Sodium 138   Recent Lipid Panel    Component Value Date/Time   CHOL 77 09/16/2017 0358   TRIG 64 09/16/2017 0358   HDL 19 (L) 09/16/2017 0358   CHOLHDL 4.1 09/16/2017 0358   VLDL 13 09/16/2017 0358   LDLCALC 45 09/16/2017 0358    ECG personally reviewed by me today-none today.  Echocardiogram 01/07/2020 IMPRESSIONS    1. Left ventricular ejection fraction, by estimation, is 65 to 70%. The  left ventricle has normal function. The left ventricle has no regional  wall motion abnormalities. There is mild left ventricular hypertrophy.  Left ventricular diastolic parameters  were normal.  2. Right ventricular systolic function is normal. The right ventricular  size is normal.  3. The mitral valve is normal in structure. No evidence of mitral valve  regurgitation. No evidence of mitral stenosis.  4. The aortic valve is tricuspid. Aortic valve regurgitation is not  visualized. No aortic stenosis is present.  5. The inferior vena cava is normal in size with greater  than 50%  respiratory variability, suggesting right atrial pressure of 3 mmHg.    Assessment & Plan   1.  Coronary artery disease-no  chest pain today.  Continues with intermittent episodes of discomfort after swallowing/drinking hot drinks and eating.  Echocardiogram showed normal LV function and no valvular abnormalities.  Appears to be epigastric in nature, denies exertional symptoms, and previous anginal equivalent nausea.   Continue aspirin, atorvastatin Heart healthy low-sodium diet-salty 6 given Increase physical activity as tolerated Continue to monitor  Lower extremity edema-+1 pitting bilateral lower extremity edema.  No increased DOE or activity intolerance.  Weight stable.  Sharon Lawrence reports Sharon Lawrence is drinking around 120 fluid ounces daily Continue torsemide Heart healthy low-sodium diet-salty 6 given Increase physical activity as tolerated Daily weights-May take extra dose of torsemide with weight increase of 3 pounds overnight or 5 pounds in 1 week Reduce fluid consumption to between 48-64 ounces daily  Hyperlipidemia-LDL 45 on 09/16/2017.  Statin dosing limited due to previously elevated LFTs. Continue atorvastatin every other day Sharon Lawrence healthy low-sodium high-fiber diet Increase physical activity as tolerated  Disposition: Follow-up with Dr. Harl Bowie 6 months  Jossie Ng. Josslin Sanjuan NP-C    02/03/2020, 4:11 PM [facility] [address] Office [phone] Fax [phone]  Notice: This dictation was prepared with Dragon dictation along with smaller phrase technology. Any transcriptional errors that result from this process are unintentional and may not be corrected upon review.

## 2020-02-03 ENCOUNTER — Encounter: Payer: Self-pay | Admitting: General Practice

## 2020-02-03 ENCOUNTER — Ambulatory Visit (INDEPENDENT_AMBULATORY_CARE_PROVIDER_SITE_OTHER): Payer: BC Managed Care – PPO | Admitting: General Practice

## 2020-02-03 ENCOUNTER — Other Ambulatory Visit: Payer: Self-pay

## 2020-02-03 VITALS — BP 138/70 | HR 112 | Ht 60.0 in | Wt 226.0 lb

## 2020-02-03 DIAGNOSIS — E782 Mixed hyperlipidemia: Secondary | ICD-10-CM

## 2020-02-03 DIAGNOSIS — I2510 Atherosclerotic heart disease of native coronary artery without angina pectoris: Secondary | ICD-10-CM

## 2020-02-03 DIAGNOSIS — R600 Localized edema: Secondary | ICD-10-CM | POA: Diagnosis not present

## 2020-02-03 MED ORDER — ATORVASTATIN CALCIUM 40 MG PO TABS: 40.0000 mg | ORAL_TABLET | ORAL | 3 refills | Status: DC

## 2020-02-03 NOTE — Patient Instructions (Signed)
Medication Instructions:  Take Atorvastatin 40 mg every OTHER day at dinner.   *If you need a refill on your cardiac medications before your next appointment, please call your pharmacy*   Lab Work: None today If you have labs (blood work) drawn today and your tests are completely normal, you will receive your results only by: Marland Kitchen MyChart Message (if you have MyChart) OR . A paper copy in the mail If you have any lab test that is abnormal or we need to change your treatment, we will call you to review the results.   Testing/Procedures: None today   Follow-Up: At [facility], you and your health needs are our priority.  As part of our continuing mission to provide you with exceptional heart care, we have created designated Provider Care Teams.  These Care Teams include your primary Cardiologist (physician) and Advanced Practice Providers (APPs -  Physician Assistants and Nurse Practitioners) who all work together to provide you with the care you need, when you need it.  We recommend signing up for the patient portal called "MyChart".  Sign up information is provided on this After Visit Summary.  MyChart is used to connect with patients for Virtual Visits (Telemedicine).  Patients are able to view lab/test results, encounter notes, upcoming appointments, etc.  Non-urgent messages can be sent to your provider as well.   To learn more about what you can do with MyChart, go to [email or web address].    Your next appointment:   6 month(s)  The format for your next appointment:   In Person  Provider:   Carlyle Dolly, MD   Other Instructions None       Thank you for choosing [facility] !

## 2020-02-11 ENCOUNTER — Telehealth: Payer: BC Managed Care – PPO | Admitting: Psychiatry

## 2020-02-13 NOTE — Progress Notes (Signed)
PROVIDER NOTE: Information contained herein reflects review and annotations entered in association with encounter. Interpretation of such information and data should be left to medically-trained personnel. Information provided to patient can be located elsewhere in the medical record under "Patient Instructions". Document created using STT-dictation technology, any transcriptional errors that may result from process are unintentional.    Patient: Sharon Lawrence  Service Category: E/M  Provider: Gaspar Cola, MD  DOB: [birth year]  DOS: 02/14/2020  Specialty: Interventional Pain Management  MRN: [id number]  Setting: Ambulatory outpatient  PCP: Vidal Schwalbe, MD  Type: Established Patient    Referring Provider: Vidal Schwalbe, MD  Location: Office  Delivery: Face-to-face     HPI  Ms. Verda K Katzman, a 54 y.o. year old female, is here today because of her Chronic pain syndrome [G89.4]. Ms. Fawaz primary complain today is Back Pain (Lumbar right side ) Last encounter: My last encounter with her was on 12/23/2019. Pertinent problems: Ms. Martus has Bilateral lower extremity edema; Back muscle spasm; Right-sided low back pain with sciatica; Other chronic pain; Migraine; Chronic low back pain (Primary Area of Pain) (Bilateral) (R>L) w/ sciatica (Right); Chronic lower extremity pain (Secondary Area of Pain) (Right); Chronic sacroiliac joint pain (Bilateral) (L>R); Chronic pain syndrome; Abnormal MRI, lumbar spine (07/16/2016); DDD (degenerative disc disease), lumbar; DDD (degenerative disc disease), thoracic; Lumbar facet arthropathy (Bilateral); Lumbar facet syndrome (Bilateral); Neurogenic pain; Chronic musculoskeletal pain; Polyarthralgia; Spondylosis without myelopathy or radiculopathy, lumbosacral region; Chronic low back pain (Bilateral) w/o sciatica; and Lumbar facet synovial cyst (L5-S1) (Left) on their pertinent problem list. Pain Assessment: Severity of Chronic pain is reported as a 6 /10. Location: Back  Lower,Right/down right leg. Onset: More than a month ago. Quality: Discomfort,Aching,Throbbing,Burning,Constant. Timing: Constant. Modifying factor(s): rest, heating pad and medications. Vitals:  height is 5' (1.524 m) and weight is 226 lb (102.5 kg). Her temporal temperature is 97.4 F (36.3 C) (abnormal). Her blood pressure is 131/75 and her pulse is 84. Her respiration is 16 and oxygen saturation is 99%.   Reason for encounter: medication management.  The patient indicates doing well with the current medication regimen. No adverse reactions or side effects reported to the medications. PMP & UDS compliant.  The patient is now taking the Lyrica 150 mg p.o. twice daily, which she is tolerating well and seems to be effective.  RTCB: 05/15/2020 Nonopioids transferred 12/23/2019: Baclofen and Lyrica  Pharmacotherapy Assessment   Analgesic: Oxycodone IR 5 mg every 6 hours (20 mg/day of oxycodone) MME/day:13m/day.   Monitoring: [place] PMP: PDMP reviewed during this encounter.       Pharmacotherapy: No side-effects or adverse reactions reported. Compliance: No problems identified. Effectiveness: Clinically acceptable.  PJanett Billow RN  02/14/2020  1:01 PM  Sign when Signing Visit Nursing Pain Medication Assessment:  Safety precautions to be maintained throughout the outpatient stay will include: orient to surroundings, keep bed in low position, maintain call bell within reach at all times, provide assistance with transfer out of bed and ambulation.  Medication Inspection Compliance: Pill count conducted under aseptic conditions, in front of the patient. Neither the pills nor the bottle was removed from the patient's sight at any time. Once count was completed pills were immediately returned to the patient in their original bottle.  Medication: Oxycodone IR Pill/Patch Count: 14 of 120 pills remain Pill/Patch Appearance: Markings consistent with prescribed medication Bottle Appearance:  Standard pharmacy container. Clearly labeled. Filled Date: 136/ 15 / 2021 Last Medication intake:  Yesterday  UDS:  Summary  Date Value Ref Range Status  11/11/2019 Note  Final    Comment:    ==================================================================== [employer] Select 13 (MW) ==================================================================== Test                             Result       Flag       Units  Drug Present and Declared for Prescription Verification   Morphine                       4793         EXPECTED   ng/mg creat    Potential sources of large amounts of morphine in the absence of    codeine include administration of morphine or use of heroin.  Drug Absent but Declared for Prescription Verification   Clonazepam                     Not Detected UNEXPECTED ng/mg creat ==================================================================== Test                      Result    Flag   Units      Ref Range   Creatinine              45               mg/dL      >=20 ==================================================================== Declared Medications:  The flagging and interpretation on this report are based on the  following declared medications.  Unexpected results may arise from  inaccuracies in the declared medications.   **Note: The testing scope of this panel includes these medications:   Clonazepam (Klonopin)  Morphine (MS Contin)   **Note: The testing scope of this panel does not include the  following reported medications:   Aspirin  Atorvastatin  Baclofen  Cyanocobalamin  Duloxetine (Cymbalta)  Famotidine  Folic Acid  Ibuprofen  Levonorgestrel (Mirena)  Metoprolol  Nitroglycerin (Nitrostat)  Nystatin  Ondansetron  Pantoprazole  Pregabalin (Lyrica)  Probiotic  Promethazine  Torsemide  Vitamin D2 (Ergocalciferol) ==================================================================== For clinical consultation, please call [phone]. ====================================================================      ROS  Constitutional: Denies any fever or chills Gastrointestinal: No reported hemesis, hematochezia, vomiting, or acute GI distress Musculoskeletal: Denies any acute onset joint swelling, redness, loss of ROM, or weakness Neurological: No reported episodes of acute onset apraxia, aphasia, dysarthria, agnosia, amnesia, paralysis, loss of coordination, or loss of consciousness  Medication Review  Cyanocobalamin, DULoxetine, Vitamin D (Ergocalciferol), aspirin, atorvastatin, clonazePAM, famotidine, fluticasone, folic acid, ibuprofen, lactobacillus acidophilus, levonorgestrel, methocarbamol, metoprolol tartrate, nitroGLYCERIN, nystatin, ondansetron, oxyCODONE, pantoprazole, pregabalin, promethazine, rizatriptan, and torsemide  History Review  Allergy: Ms. Poe is allergic to bee venom, erythromycin, cephalexin, neomycin-bacitracin-polymyxin  [bacitracin-neomycin-polymyxin], neosporin original [bacitracin-neomycin-polymyxin], prednisone, sulfa antibiotics, tobrex [tobramycin], tramadol, and penicillins. Drug: Ms. Holleman  reports no history of drug use. Alcohol:  reports no history of alcohol use. Tobacco:  reports that she quit smoking about 2 years ago. Her smoking use included cigarettes. She has a 3.00 pack-year smoking history. She has never used smokeless tobacco. Social: Ms. Ligman  reports that she quit smoking about 2 years ago. Her smoking use included cigarettes. She has a 3.00 pack-year smoking history. She has never used smokeless tobacco. She reports that she does not drink alcohol and does not use drugs. Medical:  has a past medical history of ADHD (attention deficit hyperactivity  disorder), Anxiety, B12 deficiency (02/07/2011), Back pain, CAD (coronary artery disease), Celiac disease, Folic acid deficiency (02/07/2011), Headache, Iron deficiency anemia (02/07/2011), and Myocardial infarction ([place])  (09/15/2017). Surgical: Ms. Ephraim  has a past surgical history that includes Tonsillectomy; Cholecystectomy; Knee cartilage surgery (Right); LEFT HEART CATH AND CORONARY ANGIOGRAPHY (N/A, 09/15/2017); CORONARY STENT INTERVENTION (Right, 09/15/2017); and Colonoscopy with propofol (N/A, 09/26/2017). Family: family history includes Anxiety disorder in her sister; Bipolar disorder in her son; CAD in her maternal grandfather and paternal grandmother; Cancer in her mother and sister; Cirrhosis in her paternal grandfather; Depression in her sister; Diabetes in her paternal grandmother; Drug abuse in her son; Hypertension in her father and mother; Hypothyroidism in her father and paternal grandmother.  Laboratory Chemistry Profile   Renal Lab Results  Component Value Date   BUN 18 09/16/2019   CREATININE 0.55 09/16/2019   GFRAA >60 09/16/2019   GFRNONAA >60 09/16/2019     Hepatic Lab Results  Component Value Date   AST 58 (H) 09/16/2019   ALT 71 (H) 09/16/2019   ALBUMIN 3.7 09/16/2019   ALKPHOS 116 09/16/2019     Electrolytes Lab Results  Component Value Date   NA 138 09/16/2019   K 4.2 09/16/2019   CL 104 09/16/2019   CALCIUM 8.4 (L) 09/16/2019   MG 1.9 12/03/2017     Bone Lab Results  Component Value Date   VD25OH 17.25 (L) 09/16/2019   [id number] 47 04/25/2009   [id number] 47 04/25/2009   [id number]  04/25/2009    <8 (NOTE) Vitamin D3, 1,25(OH)2 indicates both endogenous production and supplementation.  Vitamin D2, 1,25(OH)2 is an indicator of exogeous sources, such as diet or supplementation.  Interpretation and therapy are based on measurement of Vitamin  D,1,25(OH)2, Total. This test was developed and its performance characteristics have been determined by [facility], [place], [place]. Performance characteristics refer to the analytical performance of the test.     Inflammation (CRP: Acute Phase) (ESR: Chronic Phase) Lab Results  Component Value Date    CRP <0.8 09/23/2018   ESRSEDRATE 7 09/23/2018       Note: Above Lab results reviewed.  Recent Imaging Review  ECHOCARDIOGRAM COMPLETE    ECHOCARDIOGRAM REPORT       Patient Name:   Averill K Senn Date of Exam: 01/07/2020 Medical Rec #:  [id number]  Height:       60.0 in Accession #:    [id number] Weight:       236.6 lb Date of Birth:  05/19/1965  BSA:          2.005 m Patient Age:    49 years   BP:           127/76 mmHg Patient Gender: F          HR:           92 bpm. Exam Location:  [facility]  Procedure: 2D Echo  Indications:    Chest Pain 786.50 / R07.9   History:        Patient has no prior history of Echocardiogram examinations.                 Previous Myocardial Infarction, Signs/Symptoms:Syncope; Risk                 Factors:Hypertension, Former Smoker and Dyslipidemia.   Sonographer:    Leavy Cella RDCS (AE) Referring Phys: [id number] [place]   1. Left ventricular ejection fraction, by estimation, is 65  to 70%. The left ventricle has normal function. The left ventricle has no regional wall motion abnormalities. There is mild left ventricular hypertrophy. Left ventricular diastolic parameters  were normal.  2. Right ventricular systolic function is normal. The right ventricular size is normal.  3. The mitral valve is normal in structure. No evidence of mitral valve regurgitation. No evidence of mitral stenosis.  4. The aortic valve is tricuspid. Aortic valve regurgitation is not visualized. No aortic stenosis is present.  5. The inferior vena cava is normal in size with greater than 50% respiratory variability, suggesting right atrial pressure of 3 mmHg.  FINDINGS  Left Ventricle: Left ventricular ejection fraction, by estimation, is 65 to 70%. The left ventricle has normal function. The left ventricle has no regional wall motion abnormalities. The left ventricular internal cavity size was normal in size. There is  mild left ventricular hypertrophy.  Left ventricular diastolic parameters were normal.  Right Ventricle: The right ventricular size is normal. No increase in right ventricular wall thickness. Right ventricular systolic function is normal.  Left Atrium: Left atrial size was normal in size.  Right Atrium: Right atrial size was normal in size.  Pericardium: There is no evidence of pericardial effusion.  Mitral Valve: The mitral valve is normal in structure. No evidence of mitral valve regurgitation. No evidence of mitral valve stenosis.  Tricuspid Valve: The tricuspid valve is normal in structure. Tricuspid valve regurgitation is not demonstrated. No evidence of tricuspid stenosis.  Aortic Valve: The aortic valve is tricuspid. Aortic valve regurgitation is not visualized. No aortic stenosis is present. Aortic valve mean gradient measures 6.5 mmHg. Aortic valve peak gradient measures 13.5 mmHg. Aortic valve area, by VTI measures 2.73  cm.  Pulmonic Valve: The pulmonic valve was not well visualized. Pulmonic valve regurgitation is not visualized. No evidence of pulmonic stenosis.  Aorta: The aortic root is normal in size and structure.  Pulmonary Artery: Indeterminant PASP, inadequate TR jet.  Venous: The inferior vena cava is normal in size with greater than 50% respiratory variability, suggesting right atrial pressure of 3 mmHg.  IAS/Shunts: No atrial level shunt detected by color flow Doppler.    LEFT VENTRICLE PLAX 2D LVIDd:         4.59 cm  Diastology LVIDs:         2.21 cm  LV e' medial:    8.59 cm/s LV PW:         1.24 cm  LV E/e' medial:  9.8 LV IVS:        1.02 cm  LV e' lateral:   7.94 cm/s LVOT diam:     2.00 cm  LV E/e' lateral: 10.6 LV SV:         80 LV SV Index:   40 LVOT Area:     3.14 cm    RIGHT VENTRICLE RV S prime:     16.00 cm/s TAPSE (M-mode): 2.0 cm  LEFT ATRIUM             Index       RIGHT ATRIUM          Index LA diam:        4.00 cm 2.00 cm/m  RA Area:     9.11 cm LA Vol (A2C):    24.0 ml 11.97 ml/m RA Volume:   17.30 ml 8.63 ml/m LA Vol (A4C):   58.1 ml 28.98 ml/m LA Biplane Vol: 40.6 ml 20.25 ml/m  AORTIC VALVE AV Area (Vmax):  2.73 cm AV Area (Vmean):   2.55 cm AV Area (VTI):     2.73 cm AV Vmax:           183.52 cm/s AV Vmean:          120.676 cm/s AV VTI:            0.293 m AV Peak Grad:      13.5 mmHg AV Mean Grad:      6.5 mmHg LVOT Vmax:         159.76 cm/s LVOT Vmean:        97.860 cm/s LVOT VTI:          0.255 m LVOT/AV VTI ratio: 0.87   AORTA Ao Root diam: 2.90 cm  MITRAL VALVE MV Area (PHT): 3.95 cm    SHUNTS MV Decel Time: 192 msec    Systemic VTI:  0.25 m MV E velocity: 84.40 cm/s  Systemic Diam: 2.00 cm MV A velocity: 73.30 cm/s MV E/A ratio:  1.15  Carlyle Dolly MD Electronically signed by Carlyle Dolly MD Signature Date/Time: 01/07/2020/4:51:17 PM      Final   Note: Reviewed        Physical Exam  General appearance: Well nourished, well developed, and well hydrated. In no apparent acute distress Mental status: Alert, oriented x 3 (person, place, & time)       Respiratory: No evidence of acute respiratory distress Eyes: PERLA Vitals: BP 131/75 (BP Location: Right Arm, Patient Position: Sitting, Cuff Size: Normal)   Pulse 84   Temp (!) 97.4 F (36.3 C) (Temporal)   Resp 16   Ht 5' (1.524 m)   Wt 226 lb (102.5 kg)   SpO2 99%   BMI 44.14 kg/m  BMI: Estimated body mass index is 44.14 kg/m as calculated from the following:   Height as of this encounter: 5' (1.524 m).   Weight as of this encounter: 226 lb (102.5 kg). Ideal: Ideal body weight: 45.5 kg (100 lb 4.9 oz) Adjusted ideal body weight: 68.3 kg (150 lb 9.4 oz)  Assessment   Status Diagnosis  Controlled Controlled Controlled 1. Chronic pain syndrome   2. Chronic low back pain (Primary Area of Pain) (Bilateral) (R>L) w/ sciatica (Right)   3. Chronic lower extremity pain (Secondary Area of Pain) (Right)   4. Pharmacologic therapy      Updated  Problems: Problem  Attention Deficit Hyperactivity Disorder, Combined Type  Syncope and Collapse  Psychophysiologic Insomnia  Generalized Anxiety Disorder  Gastro-Esophageal Reflux Disease Without Esophagitis  Major Depression, Single Episode  Iron Deficiency Anemia  Heart Attack (Hcc)  Influenza Vaccination Declined  Benign Neoplasm of Colon  Epigastric Pain  Internal Derangement of Right Knee  Moderate Persistent Asthma With (Acute) Exacerbation  Other Vitamin B12 Deficiency Anemias  Adjustment Disorder  Panic Attack    Plan of Care  Problem-specific:  No problem-specific Assessment & Plan notes found for this encounter.  Ms. SUELLEN DUROCHER has a current medication list which includes the following long-term medication(s): atorvastatin, clonazepam, duloxetine, duloxetine, fluticasone, levonorgestrel, metoprolol tartrate, nitroglycerin, [START ON 02/15/2020] oxycodone, [START ON 03/16/2020] oxycodone, [START ON 04/15/2020] oxycodone, pantoprazole, pregabalin, promethazine, rizatriptan, and torsemide.  Pharmacotherapy (Medications Ordered): Meds ordered this encounter  Medications  . oxyCODONE (OXY IR/ROXICODONE) 5 MG immediate release tablet    Sig: Take 1 tablet (5 mg total) by mouth every 6 (six) hours as needed for severe pain. Must last 30 days.    Dispense:  120 tablet  Refill:  0    Chronic Pain: STOP Act (Not applicable) Fill 1 day early if closed on refill date. Avoid benzodiazepines within 8 hours of opioids  . oxyCODONE (OXY IR/ROXICODONE) 5 MG immediate release tablet    Sig: Take 1 tablet (5 mg total) by mouth every 6 (six) hours as needed for severe pain. Must last 30 days.    Dispense:  120 tablet    Refill:  0    Chronic Pain: STOP Act (Not applicable) Fill 1 day early if closed on refill date. Avoid benzodiazepines within 8 hours of opioids  . oxyCODONE (OXY IR/ROXICODONE) 5 MG immediate release tablet    Sig: Take 1 tablet (5 mg total) by mouth every 6 (six)  hours as needed for severe pain. Must last 30 days.    Dispense:  120 tablet    Refill:  0    Chronic Pain: STOP Act (Not applicable) Fill 1 day early if closed on refill date. Avoid benzodiazepines within 8 hours of opioids   Orders:  No orders of the defined types were placed in this encounter.  Follow-up plan:   Return in about 13 weeks (around 05/15/2020) for (F2F), (Med Mgmt).      Interventional management options:  Planned, scheduled, and/or pending:       Considering:   Possible bilateral lumbar facet RFA  Diagnostic SI joint block  Possible SI joint RFA    PRN Procedures:   Diagnostic bilateral lumbar facet block #2      Recent Visits Date Type Provider Dept  12/23/19 Office Visit Milinda Pointer, MD [facility]  12/09/19 Procedure visit Milinda Pointer, MD [facility]  Showing recent visits within past 90 days and meeting all other requirements Today's Visits Date Type Provider Dept  02/14/20 Office Visit Milinda Pointer, MD [facility]  Showing today's visits and meeting all other requirements Future Appointments No visits were found meeting these conditions. Showing future appointments within next 90 days and meeting all other requirements  I discussed the assessment and treatment plan with the patient. The patient was provided an opportunity to ask questions and all were answered. The patient agreed with the plan and demonstrated an understanding of the instructions.  Patient advised to call back or seek an in-person evaluation if the symptoms or condition worsens.  Duration of encounter: 30 minutes.  Note by: Gaspar Cola, MD Date: 02/14/2020; Time: 1:05 PM

## 2020-02-14 ENCOUNTER — Encounter: Payer: Self-pay | Admitting: Pain Medicine

## 2020-02-14 ENCOUNTER — Other Ambulatory Visit: Payer: Self-pay

## 2020-02-14 ENCOUNTER — Ambulatory Visit: Payer: BC Managed Care – PPO | Attending: Pain Medicine | Admitting: Pain Medicine

## 2020-02-14 VITALS — BP 131/75 | HR 84 | Temp 97.4°F | Resp 16 | Ht 60.0 in | Wt 226.0 lb

## 2020-02-14 DIAGNOSIS — F410 Panic disorder [episodic paroxysmal anxiety] without agoraphobia: Secondary | ICD-10-CM | POA: Insufficient documentation

## 2020-02-14 DIAGNOSIS — Z79899 Other long term (current) drug therapy: Secondary | ICD-10-CM

## 2020-02-14 DIAGNOSIS — M79604 Pain in right leg: Secondary | ICD-10-CM | POA: Insufficient documentation

## 2020-02-14 DIAGNOSIS — M2391 Unspecified internal derangement of right knee: Secondary | ICD-10-CM | POA: Insufficient documentation

## 2020-02-14 DIAGNOSIS — M5441 Lumbago with sciatica, right side: Secondary | ICD-10-CM | POA: Insufficient documentation

## 2020-02-14 DIAGNOSIS — G8929 Other chronic pain: Secondary | ICD-10-CM | POA: Diagnosis not present

## 2020-02-14 DIAGNOSIS — G894 Chronic pain syndrome: Secondary | ICD-10-CM | POA: Insufficient documentation

## 2020-02-14 DIAGNOSIS — D126 Benign neoplasm of colon, unspecified: Secondary | ICD-10-CM | POA: Insufficient documentation

## 2020-02-14 DIAGNOSIS — J4541 Moderate persistent asthma with (acute) exacerbation: Secondary | ICD-10-CM | POA: Insufficient documentation

## 2020-02-14 DIAGNOSIS — D518 Other vitamin B12 deficiency anemias: Secondary | ICD-10-CM | POA: Insufficient documentation

## 2020-02-14 DIAGNOSIS — F4320 Adjustment disorder, unspecified: Secondary | ICD-10-CM | POA: Insufficient documentation

## 2020-02-14 DIAGNOSIS — R1013 Epigastric pain: Secondary | ICD-10-CM | POA: Insufficient documentation

## 2020-02-14 MED ORDER — OXYCODONE HCL 5 MG PO TABS: 5.0000 mg | ORAL_TABLET | Freq: Four times a day (QID) | ORAL | 0 refills | Status: DC | PRN

## 2020-02-14 NOTE — Patient Instructions (Signed)
____________________________________________________________________________________________  Medication Rules  Purpose: To inform patients, and their family members, of our rules and regulations.  Applies to: All patients receiving prescriptions (written or electronic).  Pharmacy of record: Pharmacy where electronic prescriptions will be sent. If written prescriptions are taken to a different pharmacy, please inform the nursing staff. The pharmacy listed in the electronic medical record should be the one where you would like electronic prescriptions to be sent.  Electronic prescriptions: In compliance with the [place] [employer]reng[employer] Opioid Misuse Prevention (STOP) Act of 2017 (Session Law 2017-74/H243), effective March 04, 2018, all controlled substances must be electronically prescribed. Calling prescriptions to the pharmacy will cease to exist.  Prescription refills: Only during scheduled appointments. Applies to all prescriptions.  NOTE: The following applies primarily to controlled substances (Opioid* Pain Medications).   Type of encounter (visit): For patients receiving controlled substances, face-to-face visits are required. (Not an option or up to the patient.)  Patient's responsibilities: 1. Pain Pills: Bring all pain pills to every appointment (except for procedure appointments). 2. Pill Bottles: Bring pills in original pharmacy bottle. Always bring the newest bottle. Bring bottle, even if empty. 3. Medication refills: You are responsible for knowing and keeping track of what medications you take and those you need refilled. The day before your appointment: write a list of all prescriptions that need to be refilled. The day of the appointment: give the list to the admitting nurse. Prescriptions will be written only during appointments. No prescriptions will be written on procedure days. If you forget a medication: it will not be "Called in", "Faxed", or "electronically sent".  You will need to get another appointment to get these prescribed. No early refills. Do not call asking to have your prescription filled early. 4. Prescription Accuracy: You are responsible for carefully inspecting your prescriptions before leaving our office. Have the discharge nurse carefully go over each prescription with you, before taking them home. Make sure that your name is accurately spelled, that your address is correct. Check the name and dose of your medication to make sure it is accurate. Check the number of pills, and the written instructions to make sure they are clear and accurate. Make sure that you are given enough medication to last until your next medication refill appointment. 5. Taking Medication: Take medication as prescribed. When it comes to controlled substances, taking less pills or less frequently than prescribed is permitted and encouraged. Never take more pills than instructed. Never take medication more frequently than prescribed.  6. Inform other Doctors: Always inform, all of your healthcare providers, of all the medications you take. 7. Pain Medication from other Providers: You are not allowed to accept any additional pain medication from any other Doctor or Healthcare provider. There are two exceptions to this rule. (see below) In the event that you require additional pain medication, you are responsible for notifying us, as stated below. 8. Cough Medicine: Often these contain an opioid, such as codeine or hydrocodone. Never accept or take cough medicine containing these opioids if you are already taking an opioid* medication. The combination may cause respiratory failure and death. 9. Medication Agreement: You are responsible for carefully reading and following our Medication Agreement. This must be signed before receiving any prescriptions from our practice. Safely store a copy of your signed Agreement. Violations to the Agreement will result in no further prescriptions.  (Additional copies of our Medication Agreement are available upon request.) 10. Laws, Rules, & Regulations: All patients are expected to follow all   Federal and State Laws, Statutes, Rules, & Regulations. Ignorance of the Laws does not constitute a valid excuse.  11. Illegal drugs and Controlled Substances: The use of illegal substances (including, but not limited to marijuana and its derivatives) and/or the illegal use of any controlled substances is strictly prohibited. Violation of this rule may result in the immediate and permanent discontinuation of any and all prescriptions being written by our practice. The use of any illegal substances is prohibited. 12. Adopted CDC guidelines & recommendations: Target dosing levels will be at or below 60 MME/day. Use of benzodiazepines** is not recommended.  Exceptions: There are only two exceptions to the rule of not receiving pain medications from other Healthcare Providers. 1. Exception #1 (Emergencies): In the event of an emergency (i.e.: accident requiring emergency care), you are allowed to receive additional pain medication. However, you are responsible for: As soon as you are able, call our office [phone], at any time of the day or night, and leave a message stating your name, the date and nature of the emergency, and the name and dose of the medication prescribed. In the event that your call is answered by a member of our staff, make sure to document and save the date, time, and the name of the person that took your information.  2. Exception #2 (Planned Surgery): In the event that you are scheduled by another doctor or dentist to have any type of surgery or procedure, you are allowed (for a period no longer than 30 days), to receive additional pain medication, for the acute post-op pain. However, in this case, you are responsible for picking up a copy of our "Post-op Pain Management for Surgeons" handout, and giving it to your surgeon or dentist. This  document is available at our office, and does not require an appointment to obtain it. Simply go to our office during business hours (Monday-Thursday from 8:00 AM to 4:00 PM) (Friday 8:00 AM to 12:00 Noon) or if you have a scheduled appointment with us, prior to your surgery, and ask for it by name. In addition, you are responsible for: calling our office [phone], at any time of the day or night, and leaving a message stating your name, name of your surgeon, type of surgery, and date of procedure or surgery. Failure to comply with your responsibilities may result in termination of therapy involving the controlled substances.  *Opioid medications include: morphine, codeine, oxycodone, oxymorphone, hydrocodone, hydromorphone, meperidine, tramadol, tapentadol, buprenorphine, fentanyl, methadone. **Benzodiazepine medications include: diazepam (Valium), alprazolam (Xanax), clonazepam (Klonopine), lorazepam (Ativan), clorazepate (Tranxene), chlordiazepoxide (Librium), estazolam (Prosom), oxazepam (Serax), temazepam (Restoril), triazolam (Halcion) (Last updated: 01/31/2020) ____________________________________________________________________________________________   ____________________________________________________________________________________________  Medication Recommendations and Reminders  Applies to: All patients receiving prescriptions (written and/or electronic).  Medication Rules & Regulations: These rules and regulations exist for your safety and that of others. They are not flexible and neither are we. Dismissing or ignoring them will be considered "non-compliance" with medication therapy, resulting in complete and irreversible termination of such therapy. (See document titled "Medication Rules" for more details.) In all conscience, because of safety reasons, we cannot continue providing a therapy where the patient does not follow instructions.  Pharmacy of record:   Definition:  This is the pharmacy where your electronic prescriptions will be sent.   We do not endorse any particular pharmacy, however, we have experienced problems with [facility] not securing enough medication supply for the community.  We do not restrict you in your choice of pharmacy. However,   once we write for your prescriptions, we will NOT be re-sending more prescriptions to fix restricted supply problems created by your pharmacy, or your insurance.   The pharmacy listed in the electronic medical record should be the one where you want electronic prescriptions to be sent.  If you choose to change pharmacy, simply notify our nursing staff.  Recommendations:  Keep all of your pain medications in a safe place, under lock and key, even if you live alone. We will NOT replace lost, stolen, or damaged medication.  After you fill your prescription, take 1 week's worth of pills and put them away in a safe place. You should keep a separate, properly labeled bottle for this purpose. The remainder should be kept in the original bottle. Use this as your primary supply, until it runs out. Once it's gone, then you know that you have 1 week's worth of medicine, and it is time to come in for a prescription refill. If you do this correctly, it is unlikely that you will ever run out of medicine.  To make sure that the above recommendation works, it is very important that you make sure your medication refill appointments are scheduled at least 1 week before you run out of medicine. To do this in an effective manner, make sure that you do not leave the office without scheduling your next medication management appointment. Always ask the nursing staff to show you in your prescription , when your medication will be running out. Then arrange for the receptionist to get you a return appointment, at least 7 days before you run out of medicine. Do not wait until you have 1 or 2 pills left, to come in. This is very poor planning and  does not take into consideration that we may need to cancel appointments due to bad weather, sickness, or emergencies affecting our staff.  DO NOT ACCEPT A "Partial Fill": If for any reason your pharmacy does not have enough pills/tablets to completely fill or refill your prescription, do not allow for a "partial fill". The law allows the pharmacy to complete that prescription within 72 hours, without requiring a new prescription. If they do not fill the rest of your prescription within those 72 hours, you will need a separate prescription to fill the remaining amount, which we will NOT provide. If the reason for the partial fill is your insurance, you will need to talk to the pharmacist about payment alternatives for the remaining tablets, but again, DO NOT ACCEPT A PARTIAL FILL, unless you can trust your pharmacist to obtain the remainder of the pills within 72 hours.  Prescription refills and/or changes in medication(s):   Prescription refills, and/or changes in dose or medication, will be conducted only during scheduled medication management appointments. (Applies to both, written and electronic prescriptions.)  No refills on procedure days. No medication will be changed or started on procedure days. No changes, adjustments, and/or refills will be conducted on a procedure day. Doing so will interfere with the diagnostic portion of the procedure.  No phone refills. No medications will be "called into the pharmacy".  No Fax refills.  No weekend refills.  No Holliday refills.  No after hours refills.  Remember:  Business hours are:  Monday to Thursday 8:00 AM to 4:00 PM Provider's Schedule: Reginold Beale, MD - Appointments are:  Medication management: Monday and Wednesday 8:00 AM to 4:00 PM Procedure day: Tuesday and Thursday 7:30 AM to 4:00 PM Bilal Lateef, MD - Appointments are:    Medication management: Tuesday and Thursday 8:00 AM to 4:00 PM Procedure day: Monday and Wednesday  7:30 AM to 4:00 PM (Last update: 09/22/2019) ____________________________________________________________________________________________   ____________________________________________________________________________________________  CBD (cannabidiol) WARNING  Applicable to: All individuals currently taking or considering taking CBD (cannabidiol) and, more important, all patients taking opioid analgesic controlled substances (pain medication). (Example: oxycodone; oxymorphone; hydrocodone; hydromorphone; morphine; methadone; tramadol; tapentadol; fentanyl; buprenorphine; butorphanol; dextromethorphan; meperidine; codeine; etc.)  Legal status: CBD remains a Schedule I drug prohibited for any use. CBD is illegal with one exception. In the [place], CBD has a limited Food and Drug Administration (FDA) approval for the treatment of two specific types of epilepsy disorders. Only one CBD product has been approved by the FDA for this purpose: "Epidiolex". FDA is aware that some companies are marketing products containing cannabis and cannabis-derived compounds in ways that violate the Federal Food, Drug and Cosmetic Act (FD&C Act) and that may put the health and safety of consumers at risk. The FDA, a Federal agency, has not enforced the CBD status since 2018.   Legality: Some manufacturers ship CBD products nationally, which is illegal. Often such products are sold online and are therefore available throughout the country. CBD is openly sold in head shops and health food stores in some states where such sales have not been explicitly legalized. Selling unapproved products with unsubstantiated therapeutic claims is not only a violation of the law, but also can put patients at risk, as these products have not been proven to be safe or effective. Federal illegality makes it difficult to conduct research on CBD.  Reference: "FDA Regulation of Cannabis and Cannabis-Derived Products, Including Cannabidiol  (CBD)" - https://www.fda.gov/news-events/public-health-focus/fda-regulation-cannabis-and-cannabis-derived-products-including-cannabidiol-cbd  Warning: CBD is not FDA approved and has not undergo the same manufacturing controls as prescription drugs.  This means that the purity and safety of available CBD may be questionable. Most of the time, despite manufacturer's claims, it is contaminated with THC (delta-9-tetrahydrocannabinol - the chemical in marijuana responsible for the "HIGH").  When this is the case, the THC contaminant will trigger a positive urine drug screen (UDS) test for Marijuana (carboxy-THC). Because a positive UDS for any illicit substance is a violation of our medication agreement, your opioid analgesics (pain medicine) may be permanently discontinued.  MORE ABOUT CBD  General Information: CBD  is a derivative of the Marijuana (cannabis sativa) plant discovered in 1940. It is one of the 113 identified substances found in Marijuana. It accounts for up to 40% of the plant's extract. As of 2018, preliminary clinical studies on CBD included research for the treatment of anxiety, movement disorders, and pain. CBD is available and consumed in multiple forms, including inhalation of smoke or vapor, as an aerosol spray, and by mouth. It may be supplied as an oil containing CBD, capsules, dried cannabis, or as a liquid solution. CBD is thought not to be as psychoactive as THC (delta-9-tetrahydrocannabinol - the chemical in marijuana responsible for the "HIGH"). Studies suggest that CBD may interact with different biological target receptors in the body, including cannabinoid and other neurotransmitter receptors. As of 2018 the mechanism of action for its biological effects has not been determined.  Side-effects  Adverse reactions: Dry mouth, diarrhea, decreased appetite, fatigue, drowsiness, malaise, weakness, sleep disturbances, and others.  Drug interactions: CBC may interact with other  medications such as blood-thinners. (Last update: 10/09/2019) ____________________________________________________________________________________________    

## 2020-02-14 NOTE — Progress Notes (Signed)
Nursing Pain Medication Assessment:  Safety precautions to be maintained throughout the outpatient stay will include: orient to surroundings, keep bed in low position, maintain call bell within reach at all times, provide assistance with transfer out of bed and ambulation.  Medication Inspection Compliance: Pill count conducted under aseptic conditions, in front of the patient. Neither the pills nor the bottle was removed from the patient's sight at any time. Once count was completed pills were immediately returned to the patient in their original bottle.  Medication: Oxycodone IR Pill/Patch Count: 14 of 120 pills remain Pill/Patch Appearance: Markings consistent with prescribed medication Bottle Appearance: Standard pharmacy container. Clearly labeled. Filled Date: 24 / 15 / 2021 Last Medication intake:  Yesterday

## 2020-03-07 DIAGNOSIS — R6889 Other general symptoms and signs: Secondary | ICD-10-CM | POA: Diagnosis not present

## 2020-03-13 DIAGNOSIS — L97919 Non-pressure chronic ulcer of unspecified part of right lower leg with unspecified severity: Secondary | ICD-10-CM | POA: Diagnosis not present

## 2020-03-13 DIAGNOSIS — I87331 Chronic venous hypertension (idiopathic) with ulcer and inflammation of right lower extremity: Secondary | ICD-10-CM | POA: Diagnosis not present

## 2020-03-23 DIAGNOSIS — Z20828 Contact with and (suspected) exposure to other viral communicable diseases: Secondary | ICD-10-CM | POA: Diagnosis not present

## 2020-04-19 ENCOUNTER — Other Ambulatory Visit: Payer: Self-pay

## 2020-04-19 ENCOUNTER — Other Ambulatory Visit (HOSPITAL_COMMUNITY): Payer: Self-pay | Admitting: Emergency Medicine

## 2020-04-19 ENCOUNTER — Other Ambulatory Visit: Payer: Self-pay | Admitting: Emergency Medicine

## 2020-04-19 ENCOUNTER — Ambulatory Visit (HOSPITAL_COMMUNITY)
Admission: RE | Admit: 2020-04-19 | Discharge: 2020-04-19 | Disposition: A | Discharge status: Home / Self Care | Payer: BC Managed Care – PPO | Source: Ambulatory Visit | Attending: Emergency Medicine | Admitting: Emergency Medicine

## 2020-04-19 DIAGNOSIS — L03818 Cellulitis of other sites: Secondary | ICD-10-CM | POA: Diagnosis not present

## 2020-04-27 ENCOUNTER — Other Ambulatory Visit: Payer: Self-pay | Admitting: Psychiatry

## 2020-04-27 DIAGNOSIS — F411 Generalized anxiety disorder: Secondary | ICD-10-CM

## 2020-04-27 DIAGNOSIS — F330 Major depressive disorder, recurrent, mild: Secondary | ICD-10-CM

## 2020-04-27 DIAGNOSIS — F410 Panic disorder [episodic paroxysmal anxiety] without agoraphobia: Secondary | ICD-10-CM

## 2020-04-27 MED ORDER — DULOXETINE HCL 60 MG PO CPEP
ORAL_CAPSULE | ORAL | 0 refills | Status: DC
Start: 2020-04-27 — End: 2020-11-28

## 2020-04-27 NOTE — Telephone Encounter (Signed)
I have sent Cymbalta to pharmacy.

## 2020-05-13 NOTE — Progress Notes (Signed)
Patient: Sharon Lawrence  Service Category: E/M  Provider: Gaspar Cola, MD  DOB: November 14, 1965  DOS: 05/15/2020  Location: Office  MRN: [id number]  Setting: Ambulatory outpatient  Referring Provider: Vidal Schwalbe, MD  Type: Established Patient  Specialty: Interventional Pain Management  PCP: Vidal Schwalbe, MD  Location: Remote location  Delivery: TeleHealth     Virtual Encounter - Pain Management PROVIDER NOTE: Information contained herein reflects review and annotations entered in association with encounter. Interpretation of such information and data should be left to medically-trained personnel. Information provided to patient can be located elsewhere in the medical record under "Patient Instructions". Document created using STT-dictation technology, any transcriptional errors that may result from process are unintentional.    Contact & Pharmacy Preferred: [phone] Home: [phone] (home) Mobile: [phone] (mobile) E-mail: [email or web address]  [place], [place] - [address] [place] [address] Phone: [phone] Fax: [phone]   Pre-screening  Sharon Lawrence offered "in-person" vs "virtual" encounter. Sharon Lawrence indicated preferring virtual for this encounter.   Reason COVID-19*  Social distancing based on CDC and AMA recommendations.   I contacted Sharon Lawrence on 05/15/2020 via telephone.      I clearly identified myself as Gaspar Cola, MD. I verified that I was speaking with the correct person using two identifiers (Name: Sharon Lawrence, and date of birth: 1965-11-24).  Consent I sought verbal advanced consent from [place] for virtual visit interactions. I informed Sharon Lawrence of possible security and privacy concerns, risks, and limitations associated with providing "not-in-person" medical evaluation and management services. I also informed Sharon Lawrence of the availability of "in-person" appointments. Finally, I informed her that there  would be a charge for the virtual visit and that Sharon Lawrence could be  personally, fully or partially, financially responsible for it. Ms. Perin expressed understanding and agreed to proceed.   Historic Elements   Sharon Lawrence is a 55 y.o. year old, female patient evaluated today after our last contact on 02/14/2020. Sharon Lawrence  has a past medical history of ADHD (attention deficit hyperactivity disorder), Anxiety, B12 deficiency (02/07/2011), Back pain, CAD (coronary artery disease), Celiac disease, Folic acid deficiency (02/07/2011), Headache, Iron deficiency anemia (02/07/2011), and Myocardial infarction ([place]) (09/15/2017). Sharon Lawrence also  has a past surgical history that includes Tonsillectomy; Cholecystectomy; Knee cartilage surgery (Right); LEFT HEART CATH AND CORONARY ANGIOGRAPHY (N/A, 09/15/2017); CORONARY STENT INTERVENTION (Right, 09/15/2017); and Colonoscopy with propofol (N/A, 09/26/2017). Sharon Lawrence has a current medication list which includes the following prescription(s): aspirin, atorvastatin, clonazepam, cyanocobalamin, duloxetine, duloxetine, famotidine, fluticasone, folic acid, ibuprofen, lactobacillus acidophilus, levonorgestrel, methocarbamol, metoprolol tartrate, nitroglycerin, nystatin, ondansetron, pantoprazole, pregabalin, promethazine, rizatriptan, torsemide, vitamin d (ergocalciferol), and oxycodone. Sharon Lawrence  reports that Sharon Lawrence quit smoking about 3 years ago. Her smoking use included cigarettes. Sharon Lawrence has a 3.00 pack-year smoking history. Sharon Lawrence has never used smokeless tobacco. Sharon Lawrence reports that Sharon Lawrence does not drink alcohol and does not use drugs. Sharon Lawrence is allergic to bee venom, erythromycin, cephalexin, neomycin-bacitracin-polymyxin  [bacitracin-neomycin-polymyxin], neosporin original [bacitracin-neomycin-polymyxin], prednisone, sulfa antibiotics, tobrex [tobramycin], tramadol, and penicillins.   HPI  Today, Sharon Lawrence is being contacted for medication management.  The patient was initially scheduled to come in for a  face-to-face medication management encounter, but Sharon Lawrence called indicating that Sharon Lawrence had recently been diagnosed with COVID-19.  Sharon Lawrence refers having tested positive this past Saturday.  We allowed for the appointment to be switched to a virtual visit.  The patient indicates doing well with  the current medication regimen. No adverse reactions or side effects reported to the medications.   Sharon Lawrence also indicates that for the past 6 weeks her pain has increased on the right side of her lower back and it is referring pain through the back of the leg to the fold of her knee.  No pain below the knee.  Sharon Lawrence does have a history of facet disease.  Upon her return, we will schedule her to return for a right-sided lumbar facet block under fluoroscopic guidance and IV sedation.  If this provides her with good relief of the pain, at least for the duration of the local anesthetic, then we will proceed with radiofrequency ablation.  RTCB: 06/14/2020 Nonopioids transferred 12/23/2019: Baclofen and Lyrica  Pharmacotherapy Assessment  Analgesic: Oxycodone IR 5 mg every 6 hours (20 mg/day of oxycodone) MME/day:76m/day.   Monitoring: [place] PMP: PDMP reviewed during this encounter.       Pharmacotherapy: No side-effects or adverse reactions reported. Compliance: No problems identified. Effectiveness: Clinically acceptable. Plan: Refer to "POC".  UDS:  Summary  Date Value Ref Range Status  11/11/2019 Note  Final    Comment:    ==================================================================== [employer] Select 13 (MW) ==================================================================== Test                             Result       Flag       Units  Drug Present and Declared for Prescription Verification   Morphine                       4793         EXPECTED   ng/mg creat    Potential sources of large amounts of morphine in the absence of    codeine include administration of morphine or use of heroin.  Drug Absent  but Declared for Prescription Verification   Clonazepam                     Not Detected UNEXPECTED ng/mg creat ==================================================================== Test                      Result    Flag   Units      Ref Range   Creatinine              45               mg/dL      >=20 ==================================================================== Declared Medications:  The flagging and interpretation on this report are based on the  following declared medications.  Unexpected results may arise from  inaccuracies in the declared medications.   **Note: The testing scope of this panel includes these medications:   Clonazepam (Klonopin)  Morphine (MS Contin)   **Note: The testing scope of this panel does not include the  following reported medications:   Aspirin  Atorvastatin  Baclofen  Cyanocobalamin  Duloxetine (Cymbalta)  Famotidine  Folic Acid  Ibuprofen  Levonorgestrel (Mirena)  Metoprolol  Nitroglycerin (Nitrostat)  Nystatin  Ondansetron  Pantoprazole  Pregabalin (Lyrica)  Probiotic  Promethazine  Torsemide  Vitamin D2 (Ergocalciferol) ==================================================================== For clinical consultation, please call ([phone] ====================================================================     Laboratory Chemistry Profile   Renal Lab Results  Component Value Date   BUN 18 09/16/2019   CREATININE 0.55 09/16/2019   GFRAA >60 09/16/2019   GFRNONAA >60 09/16/2019     Hepatic  Lab Results  Component Value Date   AST 58 (H) 09/16/2019   ALT 71 (H) 09/16/2019   ALBUMIN 3.7 09/16/2019   ALKPHOS 116 09/16/2019     Electrolytes Lab Results  Component Value Date   NA 138 09/16/2019   K 4.2 09/16/2019   CL 104 09/16/2019   CALCIUM 8.4 (L) 09/16/2019   MG 1.9 12/03/2017     Bone Lab Results  Component Value Date   VD25OH 17.25 (L) 09/16/2019   [id number] 47 04/25/2009   [id number] 47  04/25/2009   [id number]  04/25/2009    <8 (NOTE) Vitamin D3, 1,25(OH)2 indicates both endogenous production and supplementation.  Vitamin D2, 1,25(OH)2 is an indicator of exogeous sources, such as diet or supplementation.  Interpretation and therapy are based on measurement of Vitamin  D,1,25(OH)2, Total. This test was developed and its performance characteristics have been determined by [facility], [place], [place]. Performance characteristics refer to the analytical performance of the test.     Inflammation (CRP: Acute Phase) (ESR: Chronic Phase) Lab Results  Component Value Date   CRP <0.8 09/23/2018   ESRSEDRATE 7 09/23/2018       Note: Above Lab results reviewed.  Imaging  US Venous Img Lower Unilateral Right (DVT) CLINICAL DATA:  Cellulitis.  Redness.  EXAM: RIGHT LOWER EXTREMITY VENOUS DOPPLER ULTRASOUND  TECHNIQUE: Gray-scale sonography with compression, as well as color and duplex ultrasound, were performed to evaluate the deep venous system(s) from the level of the common femoral vein through the popliteal and proximal calf veins.  COMPARISON:  None.  FINDINGS: VENOUS  Normal compressibility of the common femoral, superficial femoral, and popliteal veins, as well as the visualized calf veins. Visualized portions of profunda femoral vein and great saphenous vein unremarkable. No filling defects to suggest DVT on grayscale or color Doppler imaging. Doppler waveforms show normal direction of venous flow, normal respiratory plasticity and response to augmentation.  Limited views of the contralateral common femoral vein are unremarkable.  OTHER  None.  Limitations: Limitations secondary to patient body habitus.  IMPRESSION: No evidence of right lower extremity deep venous thrombosis.  Electronically Signed   By: Abigail Miyamoto M.D.   On: 04/19/2020 14:46  Assessment  The primary encounter diagnosis was Chronic pain syndrome. Diagnoses  of Chronic low back pain (Primary Area of Pain) (Bilateral) (R>L) w/ sciatica (Right), Lumbar facet syndrome (Bilateral), Lumbar facet arthropathy (Bilateral), Chronic lower extremity pain (Secondary Area of Pain) (Right), Back muscle spasm, Pharmacologic therapy, and Uncomplicated opioid dependence (HCC) were also pertinent to this visit.  Plan of Care  Problem-specific:  No problem-specific Assessment & Plan notes found for this encounter.  Ms. VALLI RANDOL has a current medication list which includes the following long-term medication(s): atorvastatin, clonazepam, duloxetine, duloxetine, fluticasone, levonorgestrel, metoprolol tartrate, nitroglycerin, oxycodone, pantoprazole, pregabalin, promethazine, rizatriptan, and torsemide.  Pharmacotherapy (Medications Ordered): Meds ordered this encounter  Medications  . oxyCODONE (OXY IR/ROXICODONE) 5 MG immediate release tablet    Sig: Take 1 tablet (5 mg total) by mouth every 6 (six) hours as needed for severe pain. Must last 30 days.    Dispense:  120 tablet    Refill:  0    Chronic Pain: STOP Act (Not applicable) Fill 1 day early if closed on refill date. Avoid benzodiazepines within 8 hours of opioids   Orders:  No orders of the defined types were placed in this encounter.  Follow-up plan:   Return in about 30 days (around 06/14/2020) for (F2F), (  MM).      Interventional Therapies  Risk  Complexity Considerations:   WNL   Planned  Pending:   Diagnostic right lumbar facet block #2    Under consideration:   Diagnostic bilateral lumbar facet block #2  Possible bilateral lumbar facet RFA  Diagnostic right SI joint block  Possible right SI joint RFA    Completed:   Diagnostic bilateral lumbar facet block x1 (12/09/2019) (100/50/50/50)   Therapeutic  Palliative (PRN) options:   Diagnostic bilateral lumbar facet block #2     Recent Visits No visits were found meeting these conditions. Showing recent visits within past 90 days and  meeting all other requirements Today's Visits Date Type Provider Dept  05/15/20 Telemedicine Milinda Pointer, MD [facility]  Showing today's visits and meeting all other requirements Future Appointments Date Type Provider Dept  06/14/20 Appointment Milinda Pointer, MD [facility]  Showing future appointments within next 90 days and meeting all other requirements  I discussed the assessment and treatment plan with the patient. The patient was provided an opportunity to ask questions and all were answered. The patient agreed with the plan and demonstrated an understanding of the instructions.  Patient advised to call back or seek an in-person evaluation if the symptoms or condition worsens.  Duration of encounter: 13 minutes.  Note by: Gaspar Cola, MD Date: 05/15/2020; Time: 2:19 PM

## 2020-05-15 ENCOUNTER — Ambulatory Visit: Payer: Self-pay | Attending: Pain Medicine | Admitting: Pain Medicine

## 2020-05-15 ENCOUNTER — Other Ambulatory Visit: Payer: Self-pay

## 2020-05-15 ENCOUNTER — Encounter: Payer: Self-pay | Admitting: Pain Medicine

## 2020-05-15 VITALS — Ht 60.0 in | Wt 200.0 lb

## 2020-05-15 DIAGNOSIS — G894 Chronic pain syndrome: Secondary | ICD-10-CM

## 2020-05-15 DIAGNOSIS — M47816 Spondylosis without myelopathy or radiculopathy, lumbar region: Secondary | ICD-10-CM

## 2020-05-15 DIAGNOSIS — Z79899 Other long term (current) drug therapy: Secondary | ICD-10-CM

## 2020-05-15 DIAGNOSIS — M79604 Pain in right leg: Secondary | ICD-10-CM

## 2020-05-15 DIAGNOSIS — G8929 Other chronic pain: Secondary | ICD-10-CM

## 2020-05-15 DIAGNOSIS — M5441 Lumbago with sciatica, right side: Secondary | ICD-10-CM

## 2020-05-15 DIAGNOSIS — F1120 Opioid dependence, uncomplicated: Secondary | ICD-10-CM

## 2020-05-15 DIAGNOSIS — M62830 Muscle spasm of back: Secondary | ICD-10-CM

## 2020-05-15 MED ORDER — OXYCODONE HCL 5 MG PO TABS: 5.0000 mg | ORAL_TABLET | Freq: Four times a day (QID) | ORAL | 0 refills | Status: DC | PRN

## 2020-06-01 ENCOUNTER — Telehealth (INDEPENDENT_AMBULATORY_CARE_PROVIDER_SITE_OTHER): Payer: Self-pay | Admitting: Psychiatry

## 2020-06-01 ENCOUNTER — Other Ambulatory Visit: Payer: Self-pay

## 2020-06-01 DIAGNOSIS — Z5329 Procedure and treatment not carried out because of patient's decision for other reasons: Secondary | ICD-10-CM

## 2020-06-01 NOTE — Progress Notes (Signed)
No response to call or text or video invite  

## 2020-06-06 ENCOUNTER — Other Ambulatory Visit: Payer: Self-pay

## 2020-06-06 ENCOUNTER — Emergency Department (HOSPITAL_COMMUNITY): Payer: Self-pay

## 2020-06-06 ENCOUNTER — Encounter (HOSPITAL_COMMUNITY): Payer: Self-pay | Admitting: Emergency Medicine

## 2020-06-06 ENCOUNTER — Observation Stay (HOSPITAL_COMMUNITY)
Admission: EM | Admit: 2020-06-06 | Discharge: 2020-06-07 | Disposition: A | Discharge status: Home / Self Care | Payer: Self-pay | Attending: Internal Medicine | Admitting: Internal Medicine

## 2020-06-06 DIAGNOSIS — F909 Attention-deficit hyperactivity disorder, unspecified type: Secondary | ICD-10-CM | POA: Insufficient documentation

## 2020-06-06 DIAGNOSIS — Z87891 Personal history of nicotine dependence: Secondary | ICD-10-CM | POA: Insufficient documentation

## 2020-06-06 DIAGNOSIS — R197 Diarrhea, unspecified: Secondary | ICD-10-CM | POA: Insufficient documentation

## 2020-06-06 DIAGNOSIS — Z79899 Other long term (current) drug therapy: Secondary | ICD-10-CM | POA: Insufficient documentation

## 2020-06-06 DIAGNOSIS — K529 Noninfective gastroenteritis and colitis, unspecified: Secondary | ICD-10-CM | POA: Diagnosis present

## 2020-06-06 DIAGNOSIS — R079 Chest pain, unspecified: Principal | ICD-10-CM | POA: Insufficient documentation

## 2020-06-06 DIAGNOSIS — K760 Fatty (change of) liver, not elsewhere classified: Secondary | ICD-10-CM | POA: Insufficient documentation

## 2020-06-06 DIAGNOSIS — D519 Vitamin B12 deficiency anemia, unspecified: Secondary | ICD-10-CM | POA: Insufficient documentation

## 2020-06-06 DIAGNOSIS — K900 Celiac disease: Secondary | ICD-10-CM | POA: Insufficient documentation

## 2020-06-06 DIAGNOSIS — Z20822 Contact with and (suspected) exposure to covid-19: Secondary | ICD-10-CM | POA: Insufficient documentation

## 2020-06-06 DIAGNOSIS — E039 Hypothyroidism, unspecified: Secondary | ICD-10-CM | POA: Insufficient documentation

## 2020-06-06 DIAGNOSIS — I2510 Atherosclerotic heart disease of native coronary artery without angina pectoris: Secondary | ICD-10-CM | POA: Insufficient documentation

## 2020-06-06 DIAGNOSIS — F419 Anxiety disorder, unspecified: Secondary | ICD-10-CM | POA: Insufficient documentation

## 2020-06-06 DIAGNOSIS — R0789 Other chest pain: Secondary | ICD-10-CM

## 2020-06-06 LAB — CBC WITH DIFFERENTIAL/PLATELET
Abs Immature Granulocytes: 0.02 10*3/uL (ref 0.00–0.07)
Basophils Absolute: 0 10*3/uL (ref 0.0–0.1)
Basophils Relative: 0 %
Eosinophils Absolute: 0.3 10*3/uL (ref 0.0–0.5)
Eosinophils Relative: 3 %
HCT: 49.8 % — ABNORMAL HIGH (ref 36.0–46.0)
Hemoglobin: 16 g/dL — ABNORMAL HIGH (ref 12.0–15.0)
Immature Granulocytes: 0 %
Lymphocytes Relative: 42 %
Lymphs Abs: 4.3 10*3/uL — ABNORMAL HIGH (ref 0.7–4.0)
MCH: 26 pg (ref 26.0–34.0)
MCHC: 32.1 g/dL (ref 30.0–36.0)
MCV: 81 fL (ref 80.0–100.0)
Monocytes Absolute: 0.7 10*3/uL (ref 0.1–1.0)
Monocytes Relative: 7 %
Neutro Abs: 5 10*3/uL (ref 1.7–7.7)
Neutrophils Relative %: 48 %
Platelets: 509 10*3/uL — ABNORMAL HIGH (ref 150–400)
RBC: 6.15 MIL/uL — ABNORMAL HIGH (ref 3.87–5.11)
RDW: 17.2 % — ABNORMAL HIGH (ref 11.5–15.5)
WBC: 10.4 10*3/uL (ref 4.0–10.5)
nRBC: 0 % (ref 0.0–0.2)

## 2020-06-06 LAB — TROPONIN I (HIGH SENSITIVITY)
Troponin I (High Sensitivity): 6 ng/L (ref <18)
Troponin I (High Sensitivity): 6 ng/L (ref <18)
Troponin I (High Sensitivity): 5 ng/L (ref <18)

## 2020-06-06 LAB — COMPREHENSIVE METABOLIC PANEL
ALT: 79 U/L — ABNORMAL HIGH (ref 0–44)
AST: 89 U/L — ABNORMAL HIGH (ref 15–41)
Albumin: 2.6 g/dL — ABNORMAL LOW (ref 3.5–5.0)
Alkaline Phosphatase: 241 U/L — ABNORMAL HIGH (ref 38–126)
Anion gap: 11 (ref 5–15)
BUN: 11 mg/dL (ref 6–20)
CO2: 20 mmol/L — ABNORMAL LOW (ref 22–32)
Calcium: 8 mg/dL — ABNORMAL LOW (ref 8.9–10.3)
Chloride: 107 mmol/L (ref 98–111)
Creatinine, Ser: 0.69 mg/dL (ref 0.44–1.00)
GFR, Estimated: >60 mL/min (ref >60)
Glucose, Bld: 102 mg/dL — ABNORMAL HIGH (ref 70–99)
Potassium: 3.2 mmol/L — ABNORMAL LOW (ref 3.5–5.1)
Sodium: 138 mmol/L (ref 135–145)
Total Bilirubin: 0.5 mg/dL (ref 0.3–1.2)
Total Protein: 5.5 g/dL — ABNORMAL LOW (ref 6.5–8.1)

## 2020-06-06 LAB — MAGNESIUM: Magnesium: 2 mg/dL (ref 1.7–2.4)

## 2020-06-06 LAB — LIPASE, BLOOD: Lipase: 24 U/L (ref 11–51)

## 2020-06-06 LAB — C DIFFICILE QUICK SCREEN W PCR REFLEX
C Diff antigen: NEGATIVE
C Diff interpretation: NOT DETECTED
C Diff toxin: NEGATIVE

## 2020-06-06 MED ORDER — PREGABALIN 75 MG PO CAPS
150.0000 mg | ORAL_CAPSULE | Freq: Two times a day (BID) | ORAL | Status: DC
  Administered 2020-06-06 – 2020-06-07 (×2): 150 mg via ORAL
  Filled 2020-06-06 (×2): qty 2

## 2020-06-06 MED ORDER — MORPHINE SULFATE (PF) 4 MG/ML IV SOLN
4.0000 mg | Freq: Once | INTRAVENOUS | Status: AC
Start: 2020-06-06 — End: 2020-06-06
  Administered 2020-06-06: 4 mg via INTRAVENOUS
  Filled 2020-06-06: qty 1

## 2020-06-06 MED ORDER — ASPIRIN 81 MG PO CHEW
81.0000 mg | CHEWABLE_TABLET | Freq: Every day | ORAL | Status: DC
  Administered 2020-06-07: 81 mg via ORAL
  Filled 2020-06-06: qty 1

## 2020-06-06 MED ORDER — LIDOCAINE VISCOUS HCL 2 % MT SOLN
15.0000 mL | Freq: Once | OROMUCOSAL | Status: AC
  Administered 2020-06-06: 15 mL via ORAL
  Filled 2020-06-06: qty 15

## 2020-06-06 MED ORDER — ALUM & MAG HYDROXIDE-SIMETH 200-200-20 MG/5ML PO SUSP
30.0000 mL | Freq: Once | ORAL | Status: AC
  Administered 2020-06-06: 30 mL via ORAL
  Filled 2020-06-06: qty 30

## 2020-06-06 MED ORDER — ATORVASTATIN CALCIUM 40 MG PO TABS: 40.0000 mg | ORAL_TABLET | ORAL | Status: DC

## 2020-06-06 MED ORDER — ONDANSETRON HCL 4 MG PO TABS
4.0000 mg | ORAL_TABLET | Freq: Four times a day (QID) | ORAL | Status: DC | PRN
  Administered 2020-06-06: 4 mg via ORAL
  Filled 2020-06-06: qty 1

## 2020-06-06 MED ORDER — ENOXAPARIN SODIUM 40 MG/0.4ML ~~LOC~~ SOLN
40.0000 mg | SUBCUTANEOUS | Status: DC
  Administered 2020-06-06: 40 mg via SUBCUTANEOUS
  Filled 2020-06-06: qty 0.4

## 2020-06-06 MED ORDER — METOPROLOL TARTRATE 25 MG PO TABS
25.0000 mg | ORAL_TABLET | Freq: Two times a day (BID) | ORAL | Status: DC
  Administered 2020-06-06 – 2020-06-07 (×2): 25 mg via ORAL
  Filled 2020-06-06 (×2): qty 1

## 2020-06-06 MED ORDER — POTASSIUM CHLORIDE IN NACL 40-0.9 MEQ/L-% IV SOLN: INTRAVENOUS | Status: AC

## 2020-06-06 MED ORDER — NITROGLYCERIN 0.4 MG SL SUBL: 0.4000 mg | SUBLINGUAL_TABLET | SUBLINGUAL | Status: DC | PRN

## 2020-06-06 MED ORDER — MORPHINE SULFATE (PF) 4 MG/ML IV SOLN: 4.0000 mg | INTRAVENOUS | Status: DC | PRN

## 2020-06-06 MED ORDER — DULOXETINE HCL 20 MG PO CPEP
20.0000 mg | ORAL_CAPSULE | Freq: Every day | ORAL | Status: DC
  Administered 2020-06-06: 20 mg via ORAL

## 2020-06-06 MED ORDER — OXYCODONE HCL 5 MG PO TABS
5.0000 mg | ORAL_TABLET | Freq: Four times a day (QID) | ORAL | Status: DC | PRN
  Administered 2020-06-06: 5 mg via ORAL
  Filled 2020-06-06: qty 1

## 2020-06-06 MED ORDER — DULOXETINE HCL 60 MG PO CPEP
60.0000 mg | ORAL_CAPSULE | Freq: Every day | ORAL | Status: DC
  Administered 2020-06-06: 60 mg via ORAL

## 2020-06-06 MED ORDER — MORPHINE SULFATE (PF) 4 MG/ML IV SOLN
4.0000 mg | Freq: Once | INTRAVENOUS | Status: AC
  Administered 2020-06-06: 4 mg via INTRAVENOUS
  Filled 2020-06-06: qty 1

## 2020-06-06 MED ORDER — PANTOPRAZOLE SODIUM 40 MG PO TBEC
40.0000 mg | DELAYED_RELEASE_TABLET | Freq: Every day | ORAL | Status: DC
  Administered 2020-06-07: 40 mg via ORAL
  Filled 2020-06-06: qty 1

## 2020-06-06 MED ORDER — ACETAMINOPHEN 325 MG PO TABS: 650.0000 mg | ORAL_TABLET | Freq: Four times a day (QID) | ORAL | Status: DC | PRN

## 2020-06-06 MED ORDER — FAMOTIDINE 20 MG PO TABS
40.0000 mg | ORAL_TABLET | Freq: Every day | ORAL | Status: DC
  Administered 2020-06-06: 40 mg via ORAL
  Filled 2020-06-06: qty 2

## 2020-06-06 MED ORDER — ONDANSETRON HCL 4 MG/2ML IJ SOLN
4.0000 mg | Freq: Four times a day (QID) | INTRAMUSCULAR | Status: DC | PRN
  Administered 2020-06-07: 4 mg via INTRAVENOUS
  Filled 2020-06-06: qty 2

## 2020-06-06 MED ORDER — ACETAMINOPHEN 650 MG RE SUPP: 650.0000 mg | Freq: Four times a day (QID) | RECTAL | Status: DC | PRN

## 2020-06-06 NOTE — ED Triage Notes (Signed)
Pt c/o cp and n/v x 2 weeks that has progressively gotten worse. Pt also has cough and congestion that started last Thursday.

## 2020-06-06 NOTE — Progress Notes (Addendum)
Patient presents with N/V and chest pain. History of CAD, initial trop is neg and benign EKG. Initial workup per medicine, from last cardiology note thought symptoms were likely GI related. We will f/u initial workup in the AM, if primary team feels symptoms are not GI can consider if further cardiac evaluation is neccesary. Cycle enzymes overnight, repeat EKG in AM.    Carlyle Dolly MD

## 2020-06-06 NOTE — ED Provider Notes (Signed)
[facility] EMERGENCY DEPARTMENT Provider Note   CSN: [id number] Arrival date & time: 06/06/20  1101     History Chief Complaint  Patient presents with  . Chest Pain    Sharon Lawrence is a 55 y.o. female.  Patient complains of chest pain off and on for 2 weeks.  The history is provided by the patient and a relative.  Chest Pain Pain location:  L chest Pain quality: aching   Pain radiates to:  Does not radiate Pain severity:  Moderate Onset quality:  Sudden Timing:  Constant Progression:  Worsening Chronicity:  New Associated symptoms: no abdominal pain, no back pain, no cough, no fatigue and no headache        Past Medical History:  Diagnosis Date  . ADHD (attention deficit hyperactivity disorder)   . Anxiety   . B12 deficiency 02/07/2011  . Back pain   . CAD (coronary artery disease)    a. s/p recent STEMI on 09/15/2017 with DES to RCA  . Celiac disease   . Folic acid deficiency 40/0/8676  . Headache   . Iron deficiency anemia 02/07/2011  . Myocardial infarction ([place]) 09/15/2017    Patient Active Problem List   Diagnosis Date Noted  . Benign neoplasm of colon 02/14/2020  . Epigastric pain 02/14/2020  . Internal derangement of right knee 02/14/2020  . Moderate persistent asthma with (acute) exacerbation 02/14/2020  . Other vitamin B12 deficiency anemias 02/14/2020  . Adjustment disorder 02/14/2020  . Panic attack 02/14/2020  . Spondylosis without myelopathy or radiculopathy, lumbosacral region 12/09/2019  . Chronic low back pain (Bilateral) w/o sciatica 12/09/2019  . Lumbar facet synovial cyst (L5-S1) (Left) 12/09/2019  . Generalized anxiety disorder 10/06/2018  . MDD (major depressive disorder), recurrent episode, mild ([place]) 10/06/2018  . Thrombocytosis 09/30/2018  . Microcytic anemia 09/23/2018  . Polyarthralgia 09/09/2018  . Abnormal MRI, lumbar spine (07/16/2016) 12/29/2017  . DDD (degenerative disc disease), lumbar 12/29/2017  . DDD (degenerative disc  disease), thoracic 12/29/2017  . Lumbar facet arthropathy (Bilateral) 12/29/2017  . Lumbar facet syndrome (Bilateral) 12/29/2017  . Long term current use of anticoagulant therapy (Plavix) 12/29/2017  . Neurogenic pain 12/29/2017  . Chronic musculoskeletal pain 12/29/2017  . Chronic low back pain (Primary Area of Pain) (Bilateral) (R>L) w/ sciatica (Right) 12/03/2017  . Chronic lower extremity pain (Secondary Area of Pain) (Right) 12/03/2017  . Chronic sacroiliac joint pain (Bilateral) (L>R) 12/03/2017  . Chronic pain syndrome 12/03/2017  . Long term current use of opiate analgesic 12/03/2017  . Pharmacologic therapy 12/03/2017  . Disorder of skeletal system 12/03/2017  . Problems influencing health status 12/03/2017  . Hypothyroid 11/06/2017  . History of ST elevation myocardial infarction (STEMI) 11/06/2017  . GI bleed 11/06/2017  . Gastro-esophageal reflux disease without esophagitis 11/06/2017  . Bleeding per rectum 11/06/2017  . Bilateral lower extremity edema 11/06/2017  . Benign essential hypertension 11/06/2017  . Back muscle spasm 11/06/2017  . Panic disorder 11/06/2017  . Anemia 11/06/2017  . Attention deficit hyperactivity disorder, combined type 11/06/2017  . Syncope and collapse 11/06/2017  . Sessile colonic polyp 11/06/2017  . Right-sided low back pain with sciatica 11/06/2017  . RAD (reactive airway disease), moderate persistent, uncomplicated 19/50/9326  . Other chronic pain 11/06/2017  . Migraine 11/06/2017  . Major depression, single episode 11/06/2017  . Psychophysiologic insomnia 11/06/2017  . Iron deficiency anemia 11/06/2017  . Hematochezia 09/25/2017  . Heart attack ([place]) 09/25/2017  . Hypotension 09/25/2017  . Morbidly obese ([place]) 09/25/2017  .  Elevated LFTs 09/25/2017  . Chronic diarrhea, with Celiac disease 09/25/2017  . Former smoker   . Hyperlipidemia 09/17/2017  . Acute ST elevation myocardial infarction (STEMI) of inferior wall ([place]) 09/15/2017   . Family hx of colon cancer 09/11/2017  . Non-intractable vomiting with nausea 09/11/2017  . Celiac disease 09/11/2017  . Influenza vaccination declined 01/01/2017  . Vaginal condyloma 06/22/2013  . Obesity 06/22/2013  . Fatty liver disease, nonalcoholic 91/47/8295  . Folic acid deficiency 62/13/0865  . Iron deficiency 02/07/2011  . B12 deficiency 02/07/2011    Past Surgical History:  Procedure Laterality Date  . CHOLECYSTECTOMY    . COLONOSCOPY WITH PROPOFOL N/A 09/26/2017   Procedure: COLONOSCOPY WITH PROPOFOL;  Surgeon: Rogene Houston, MD;  Location: [facility];  Service: Endoscopy;  Laterality: N/A;  . CORONARY STENT INTERVENTION Right 09/15/2017   Procedure: CORONARY STENT INTERVENTION;  Surgeon: Jettie Booze, MD;  Location: [facility];  Service: Cardiovascular;  Laterality: Right;  RCA  . KNEE CARTILAGE SURGERY Right   . LEFT HEART CATH AND CORONARY ANGIOGRAPHY N/A 09/15/2017   Procedure: LEFT HEART CATH AND CORONARY ANGIOGRAPHY;  Surgeon: Jettie Booze, MD;  Location: [facility];  Service: Cardiovascular;  Laterality: N/A;  . TONSILLECTOMY       OB History    Gravida  3   Para  3   Term      Preterm  3   AB      Living  3     SAB      IAB      Ectopic      Multiple      Live Births  3           Family History  Problem Relation Age of Onset  . Cancer Mother   . Hypertension Mother   . Hypertension Father   . Hypothyroidism Father   . Cancer Sister   . Anxiety disorder Sister   . Depression Sister   . CAD Maternal Grandfather   . Cirrhosis Paternal Grandfather   . CAD Paternal Grandmother   . Diabetes Paternal Grandmother   . Hypothyroidism Paternal Grandmother   . Drug abuse Son   . Bipolar disorder Son     Social History   Tobacco Use  . Smoking status: Former Smoker    Packs/day: 0.25    Years: 12.00    Pack years: 3.00    Types: Cigarettes    Quit date: 04/04/2017    Years since quitting: 3.1   . Smokeless tobacco: Never Used  Vaping Use  . Vaping Use: Never used  Substance Use Topics  . Alcohol use: No  . Drug use: No    Home Medications Prior to Admission medications   Medication Sig Start Date End Date Taking? Authorizing Provider  aspirin 81 MG chewable tablet Chew 1 tablet (81 mg total) by mouth daily. 09/18/17  Yes Cheryln Manly, NP  atorvastatin (LIPITOR) 40 MG tablet Take 1 tablet (40 mg total) by mouth every other day. 02/03/20 05/03/20 Yes Cleaver, Jossie Ng, NP  Cyanocobalamin (VITAMIN B-12 IJ) Inject as directed every 30 (thirty) days.    Yes [provider]  DULoxetine (CYMBALTA) 20 MG capsule TAKE (1) CAPSULE BY MOUTH ONCE DAILY. *TAKE WITH 60MG CAP* Patient taking differently: Take 20 mg by mouth daily. TAKE (1) CAPSULE BY MOUTH ONCE DAILY. *TAKE WITH 60MG CAP* 04/27/20  Yes Eappen, Ria Clock, MD  DULoxetine (CYMBALTA) 60 MG capsule TAKE (  1) CAPSULE BY MOUTH ONCE DAILY. 04/27/20  Yes Ursula Alert, MD  famotidine (PEPCID) 40 MG tablet Take 40 mg by mouth at bedtime. 10/08/18  Yes [provider]  fluticasone (FLONASE) 50 MCG/ACT nasal spray Place 1 spray into both nostrils daily. As needed.   Yes [provider]  folic acid (FOLVITE) 1 MG tablet Take 1 mg by mouth 2 (two) times a day.   Yes [provider]  ibuprofen (ADVIL,MOTRIN) 800 MG tablet 800 mg every 6 (six) hours as needed.  04/16/18  Yes [provider]  lactobacillus acidophilus (BACID) TABS tablet Take 1 tablet by mouth daily.   Yes [provider]  levonorgestrel (MIRENA) 20 MCG/24HR IUD by Intrauterine route.   Yes [provider]  metoprolol tartrate (LOPRESSOR) 25 MG tablet Take 1 tablet (25 mg total) by mouth 2 (two) times daily. 01/16/18 02/03/20 Yes Branch, Alphonse Guild, MD  nitroGLYCERIN (NITROLINGUAL) 0.4 MG/SPRAY spray Place 2 sprays under the tongue every 5 (five) minutes x 3 doses as needed. 04/03/20  Yes [provider]  nystatin  (MYCOSTATIN/NYSTOP) powder Apply topically.   Yes [provider]  ondansetron (ZOFRAN-ODT) 8 MG disintegrating tablet Take 1 tablet by mouth 3 (three) times daily as needed for nausea.  08/11/17  Yes [provider]  oxyCODONE (OXY IR/ROXICODONE) 5 MG immediate release tablet Take 1 tablet (5 mg total) by mouth every 6 (six) hours as needed for severe pain. Must last 30 days. 05/15/20 06/14/20 Yes Milinda Pointer, MD  pantoprazole (PROTONIX) 40 MG tablet TAKE 1 TABLET BY MOUTH ONCE DAILY BEFORE BREAKFAST. 10/27/17  Yes Branch, Alphonse Guild, MD  pregabalin (LYRICA) 150 MG capsule Take 150 mg by mouth in the morning and at bedtime. 01/13/20  Yes [provider]  promethazine (PHENERGAN) 25 MG tablet Take 25 mg by mouth 3 (three) times daily as needed for nausea.  08/11/17  Yes [provider]  rizatriptan (MAXALT) 10 MG tablet Take 10 mg by mouth as needed. 12/31/19  Yes [provider]  torsemide (DEMADEX) 20 MG tablet 20 mg once. As needed. 10/17/17  Yes [provider]  Vitamin D, Ergocalciferol, (DRISDOL) 1.25 MG (50000 UT) CAPS capsule Take 50,000 Units by mouth every 7 (seven) days. Wednesday 09/14/18  Yes [provider]  baclofen (LIORESAL) 20 MG tablet Take by mouth at bedtime. 03/30/20   [provider]  clonazePAM (KLONOPIN) 0.5 MG tablet Take 0.5-1 tablets (0.25-0.5 mg total) by mouth as directed. 1-2 times a week as needed for severe panic sx. Patient not taking: No sig reported 11/24/19   Ursula Alert, MD  methocarbamol (ROBAXIN) 500 MG tablet Take 500 mg by mouth 4 (four) times daily as needed. Patient not taking: No sig reported 12/13/19   [provider]  nitroGLYCERIN (NITROSTAT) 0.4 MG SL tablet Place 1 tablet (0.4 mg total) under the tongue every 5 (five) minutes as needed for chest pain. Patient not taking: Reported on 06/06/2020 09/17/17   Reino Bellis B, NP    Allergies    Bee venom, Erythromycin,  Cephalexin, Neomycin-bacitracin-polymyxin  [bacitracin-neomycin-polymyxin], Neosporin original [bacitracin-neomycin-polymyxin], Prednisone, Sulfa antibiotics, Tobrex [tobramycin], Tramadol, and Penicillins  Review of Systems   Review of Systems  Constitutional: Negative for appetite change and fatigue.  HENT: Negative for congestion, ear discharge and sinus pressure.   Eyes: Negative for discharge.  Respiratory: Negative for cough.   Cardiovascular: Positive for chest pain.  Gastrointestinal: Negative for abdominal pain and diarrhea.  Genitourinary: Negative for frequency and  hematuria.  Musculoskeletal: Negative for back pain.  Skin: Negative for rash.  Neurological: Negative for seizures and headaches.  Psychiatric/Behavioral: Negative for hallucinations.    Physical Exam Updated Vital Signs BP 110/65   Pulse 80   Temp 98.7 F (37.1 C) (Oral)   Resp 18   Ht 5' (1.524 m)   Wt 104.3 kg   SpO2 98%   BMI 44.91 kg/m   Physical Exam Vitals and nursing note reviewed.  Constitutional:      Appearance: She is well-developed.  HENT:     Head: Normocephalic.     Nose: Nose normal.  Eyes:     General: No scleral icterus.    Conjunctiva/sclera: Conjunctivae normal.  Neck:     Thyroid: No thyromegaly.  Cardiovascular:     Rate and Rhythm: Normal rate and regular rhythm.     Heart sounds: No murmur heard. No friction rub. No gallop.   Pulmonary:     Breath sounds: No stridor. No wheezing or rales.  Chest:     Chest wall: No tenderness.  Abdominal:     General: There is no distension.     Tenderness: There is no abdominal tenderness. There is no rebound.  Musculoskeletal:        General: Normal range of motion.     Cervical back: Neck supple.  Lymphadenopathy:     Cervical: No cervical adenopathy.  Skin:    Findings: No erythema or rash.  Neurological:     Mental Status: She is alert and oriented to person, place, and time.     Motor: No abnormal muscle tone.      Coordination: Coordination normal.  Psychiatric:        Behavior: Behavior normal.     ED Results / Procedures / Treatments   Labs (all labs ordered are listed, but only abnormal results are displayed) Labs Reviewed  CBC WITH DIFFERENTIAL/PLATELET - Abnormal; Notable for the following components:      Result Value   RBC 6.15 (*)    Hemoglobin 16.0 (*)    HCT 49.8 (*)    RDW 17.2 (*)    Platelets 509 (*)    Lymphs Abs 4.3 (*)    All other components within normal limits  COMPREHENSIVE METABOLIC PANEL - Abnormal; Notable for the following components:   Potassium 3.2 (*)    CO2 20 (*)    Glucose, Bld 102 (*)    Calcium 8.0 (*)    Total Protein 5.5 (*)    Albumin 2.6 (*)    AST 89 (*)    ALT 79 (*)    Alkaline Phosphatase 241 (*)    All other components within normal limits  LIPASE, BLOOD  TROPONIN I (HIGH SENSITIVITY)  TROPONIN I (HIGH SENSITIVITY)    EKG EKG Interpretation  Date/Time:  Tuesday June 06 2020 14:05:04 EDT Ventricular Rate:  80 PR Interval:  132 QRS Duration: 100 QT Interval:  385 QTC Calculation: 445 R Axis:   84 Text Interpretation: Sinus rhythm Low voltage, precordial leads Confirmed by Milton Ferguson [phone]) on 06/06/2020 3:07:10 PM   Radiology DG Chest Port 1 View  Result Date: 06/06/2020 CLINICAL DATA:  Chest pain beginning yesterday. EXAM: PORTABLE CHEST 1 VIEW COMPARISON:  12/31/2017 FINDINGS: The heart size and mediastinal contours are within normal limits. Both lungs are clear. No evidence of pneumothorax or pleural effusion. IMPRESSION: No active disease. Electronically Signed   By: Marlaine Hind M.D.   On: 06/06/2020 12:52  Procedures Procedures   Medications Ordered in ED Medications  morphine 4 MG/ML injection 4 mg (4 mg Intravenous Given 06/06/20 1200)  alum & mag hydroxide-simeth (MAALOX/MYLANTA) 200-200-20 MG/5ML suspension 30 mL (30 mLs Oral Given 06/06/20 1200)    And  lidocaine (XYLOCAINE) 2 % viscous mouth solution 15 mL (15 mLs  Oral Given 06/06/20 1200)  morphine 4 MG/ML injection 4 mg (4 mg Intravenous Given 06/06/20 1501)    ED Course  I have reviewed the triage vital signs and the nursing notes.  Pertinent labs & imaging results that were available during my care of the patient were reviewed by me and considered in my medical decision making (see chart for details). CRITICAL CARE Performed by: Milton Ferguson Total critical care time: 40 minutes Critical care time was exclusive of separately billable procedures and treating other patients. Critical care was necessary to treat or prevent imminent or life-threatening deterioration. Critical care was time spent personally by me on the following activities: development of treatment plan with patient and/or surrogate as well as nursing, discussions with consultants, evaluation of patient's response to treatment, examination of patient, obtaining history from patient or surrogate, ordering and performing treatments and interventions, ordering and review of laboratory studies, ordering and review of radiographic studies, pulse oximetry and re-evaluation of patient's condition.   Patient with chest pain.  She has a history of coronary disease with a stent and a MI.  EKG shows no acute changes for troponin negative.  I spoke with cardiology and they want the patient admitted here in the medicine they will consult. MDM Rules/Calculators/A&P                          Chest pain relieved with morphine.  She will be admitted by medicine cardiology consult Final Clinical Impression(s) / ED Diagnoses Final diagnoses:  None    Rx / DC Orders ED Discharge Orders    None       Milton Ferguson, MD 06/06/20 1529

## 2020-06-06 NOTE — ED Notes (Signed)
Attending physician at bedside

## 2020-06-06 NOTE — H&P (Addendum)
History and Physical    MATHA MASSE EBX:[id number] DOB: July 02, 1965 DOA: 06/06/2020  PCP: Vidal Schwalbe, MD   Patient coming from: Home  I have personally briefly reviewed patient's old medical records in [place]  Chief Complaint: Chest pain   HPI: Sharon Lawrence is a 55 y.o. female with medical history significant for celiac's disease, obesity, coronary artery disease. Patient presented to the ED with complaints of chest pain of 2 weeks duration.  She describes midsternal chest pain as squeezing, intermittent, several episodes over the past 2 weeks, usually lasting about 5 minutes.  Last night she had the worst episode of chest pain, lasted about an hour.  Chest pain woke her up from sleep.  Her chest pain and not related to activity.  She reports difficulty breathing this morning with mild activity-bathing herself. Patient also reports nausea with multiple episodes of vomiting especially after every meal started before onset of chest pain.  Her GI symptoms do not exactly correlate with chest pain symptoms. For the past week she has had several episodes of watery stools, daily up to 8 times a day.  Stools are nonbloody.  She reports intermittent crampy lower abdominal pain before loose stools. She denies dysuria, reports cough and congestion over the past 6 days. Before onset of the symptoms, she reports compliance with her aspirin, and torsemide.  Patient reports her symptoms of initial nausea and vomiting and then later- subsequent chest pains, are exactly consistent with her symptoms when she had a heart attack and stents placed in 2019.  Since then she has done well until now.  Quit smoking cigarettes 3 years ago.  ED Course: Stable vitals heart rate 70s to 80s.  Blood pressure systolic 1 teens to 372B.  O2 sats greater than 97% on room air. Troponin 6.  EKG without significant ST or T wave changes.  Mild elevation in liver enzymes consistent with prior.  Repeat chest x-ray without  acute abnormality.  Improvement in symptoms after nitro was given in ED. EDP talked to cardiologist Dr. Harl Bowie, agreed with admission for observation,.  Review of Systems: As per HPI all other systems reviewed and negative.  Past Medical History:  Diagnosis Date  . ADHD (attention deficit hyperactivity disorder)   . Anxiety   . B12 deficiency 02/07/2011  . Back pain   . CAD (coronary artery disease)    a. s/p recent STEMI on 09/15/2017 with DES to RCA  . Celiac disease   . Folic acid deficiency 04/04/1153  . Headache   . Iron deficiency anemia 02/07/2011  . Myocardial infarction ([place]) 09/15/2017    Past Surgical History:  Procedure Laterality Date  . CHOLECYSTECTOMY    . COLONOSCOPY WITH PROPOFOL N/A 09/26/2017   Procedure: COLONOSCOPY WITH PROPOFOL;  Surgeon: Rogene Houston, MD;  [facility];  Service: Endoscopy;  Laterality: N/A;  . CORONARY STENT INTERVENTION Right 09/15/2017   Procedure: CORONARY STENT INTERVENTION;  Surgeon: Jettie Booze, MD;  Location: [facility];  Service: Cardiovascular;  Laterality: Right;  RCA  . KNEE CARTILAGE SURGERY Right   . LEFT HEART CATH AND CORONARY ANGIOGRAPHY N/A 09/15/2017   Procedure: LEFT HEART CATH AND CORONARY ANGIOGRAPHY;  Surgeon: Jettie Booze, MD;  Location: [facility];  Service: Cardiovascular;  Laterality: N/A;  . TONSILLECTOMY       reports that she quit smoking about 3 years ago. Her smoking use included cigarettes. She has a 3.00 pack-year smoking history. She  has never used smokeless tobacco. She reports that she does not drink alcohol and does not use drugs.  Allergies  Allergen Reactions  . Bee Venom Anaphylaxis  . Erythromycin Anaphylaxis  . Cephalexin   . Neomycin-Bacitracin-Polymyxin  [Bacitracin-Neomycin-Polymyxin] Swelling    Sight of application  . Neosporin Original [Bacitracin-Neomycin-Polymyxin] Swelling    Swelling at site of application  . Prednisone   . Sulfa  Antibiotics Swelling    Sulfa eye drops caused eyes to swell Sulfa eye drops caused eyes to swell  . Tobrex [Tobramycin] Swelling  . Tramadol Hives  . Penicillins Rash    Has patient had a PCN reaction causing immediate rash, facial/tongue/throat swelling, SOB or lightheadedness with hypotension: Yes Has patient had a PCN reaction causing severe rash involving mucus membranes or skin necrosis: No Has patient had a PCN reaction that required hospitalization: No Has patient had a PCN reaction occurring within the last 10 years: No If all of the above answers are "NO", then may proceed with Cephalosporin use.     Family History  Problem Relation Age of Onset  . Cancer Mother   . Hypertension Mother   . Hypertension Father   . Hypothyroidism Father   . Cancer Sister   . Anxiety disorder Sister   . Depression Sister   . CAD Maternal Grandfather   . Cirrhosis Paternal Grandfather   . CAD Paternal Grandmother   . Diabetes Paternal Grandmother   . Hypothyroidism Paternal Grandmother   . Drug abuse Son   . Bipolar disorder Son     Prior to Admission medications   Medication Sig Start Date End Date Taking? Authorizing Provider  aspirin 81 MG chewable tablet Chew 1 tablet (81 mg total) by mouth daily. 09/18/17  Yes Cheryln Manly, NP  atorvastatin (LIPITOR) 40 MG tablet Take 1 tablet (40 mg total) by mouth every other day. 02/03/20 05/03/20 Yes Cleaver, Jossie Ng, NP  Cyanocobalamin (VITAMIN B-12 IJ) Inject as directed every 30 (thirty) days.    Yes [provider]  DULoxetine (CYMBALTA) 20 MG capsule TAKE (1) CAPSULE BY MOUTH ONCE DAILY. *TAKE WITH 60MG CAP* Patient taking differently: Take 20 mg by mouth daily. TAKE (1) CAPSULE BY MOUTH ONCE DAILY. *TAKE WITH 60MG CAP* 04/27/20  Yes Eappen, Saramma, MD  DULoxetine (CYMBALTA) 60 MG capsule TAKE (1) CAPSULE BY MOUTH ONCE DAILY. 04/27/20  Yes Ursula Alert, MD  famotidine (PEPCID) 40 MG tablet Take 40 mg by mouth at bedtime. 10/08/18   Yes [provider]  fluticasone (FLONASE) 50 MCG/ACT nasal spray Place 1 spray into both nostrils daily. As needed.   Yes [provider]  folic acid (FOLVITE) 1 MG tablet Take 1 mg by mouth 2 (two) times a day.   Yes [provider]  ibuprofen (ADVIL,MOTRIN) 800 MG tablet 800 mg every 6 (six) hours as needed.  04/16/18  Yes [provider]  lactobacillus acidophilus (BACID) TABS tablet Take 1 tablet by mouth daily.   Yes [provider]  levonorgestrel (MIRENA) 20 MCG/24HR IUD by Intrauterine route.   Yes [provider]  metoprolol tartrate (LOPRESSOR) 25 MG tablet Take 1 tablet (25 mg total) by mouth 2 (two) times daily. 01/16/18 02/03/20 Yes Branch, Alphonse Guild, MD  nitroGLYCERIN (NITROLINGUAL) 0.4 MG/SPRAY spray Place 2 sprays under the tongue every 5 (five) minutes x 3 doses as needed. 04/03/20  Yes [provider]  nystatin (MYCOSTATIN/NYSTOP) powder Apply topically.   Yes [provider]  ondansetron (ZOFRAN-ODT) 8 MG disintegrating  tablet Take 1 tablet by mouth 3 (three) times daily as needed for nausea.  08/11/17  Yes [provider]  oxyCODONE (OXY IR/ROXICODONE) 5 MG immediate release tablet Take 1 tablet (5 mg total) by mouth every 6 (six) hours as needed for severe pain. Must last 30 days. 05/15/20 06/14/20 Yes Milinda Pointer, MD  pantoprazole (PROTONIX) 40 MG tablet TAKE 1 TABLET BY MOUTH ONCE DAILY BEFORE BREAKFAST. 10/27/17  Yes Branch, Alphonse Guild, MD  pregabalin (LYRICA) 150 MG capsule Take 150 mg by mouth in the morning and at bedtime. 01/13/20  Yes [provider]  promethazine (PHENERGAN) 25 MG tablet Take 25 mg by mouth 3 (three) times daily as needed for nausea.  08/11/17  Yes [provider]  rizatriptan (MAXALT) 10 MG tablet Take 10 mg by mouth as needed. 12/31/19  Yes [provider]  torsemide (DEMADEX) 20 MG tablet 20 mg once. As needed. 10/17/17  Yes [provider]  Vitamin D, Ergocalciferol, (DRISDOL) 1.25 MG (50000 UT) CAPS capsule Take 50,000 Units by mouth every 7 (seven) days. Wednesday 09/14/18  Yes [provider]  baclofen (LIORESAL) 20 MG tablet Take by mouth at bedtime. 03/30/20   [provider]  clonazePAM (KLONOPIN) 0.5 MG tablet Take 0.5-1 tablets (0.25-0.5 mg total) by mouth as directed. 1-2 times a week as needed for severe panic sx. Patient not taking: No sig reported 11/24/19   Ursula Alert, MD  methocarbamol (ROBAXIN) 500 MG tablet Take 500 mg by mouth 4 (four) times daily as needed. Patient not taking: No sig reported 12/13/19   [provider]  nitroGLYCERIN (NITROSTAT) 0.4 MG SL tablet Place 1 tablet (0.4 mg total) under the tongue every 5 (five) minutes as needed for chest pain. Patient not taking: Reported on 06/06/2020 09/17/17   Cheryln Manly, NP    Physical Exam: Vitals:   06/06/20 1330 06/06/20 1430 06/06/20 1500 06/06/20 1530  BP: 130/77 115/80 110/65 121/80  Pulse: 72 79 80 81  Resp: 14 14 18 15   Temp:      TempSrc:      SpO2: 99% 97% 98% 100%  Weight:      Height:        Constitutional: NAD, calm, comfortable Vitals:   06/06/20 1330 06/06/20 1430 06/06/20 1500 06/06/20 1530  BP: 130/77 115/80 110/65 121/80  Pulse: 72 79 80 81  Resp: 14 14 18 15   Temp:      TempSrc:      SpO2: 99% 97% 98% 100%  Weight:      Height:       Eyes: PERRL, lids and conjunctivae normal ENMT: Mucous membranes are moist.  Neck: normal, supple, no masses, no thyromegaly Respiratory: clear to auscultation bilaterally, no wheezing, no crackles. Normal respiratory effort. No accessory muscle use.  Cardiovascular: Regular rate and rhythm, no murmurs / rubs / gallops.  1-2+ pitting extremity edema bilateral lower legs, worse on the right, chronic issue but recently worse, no extremity edema. 2+ pedal pulses.  Abdomen: Mild epigastric, left-sided and lower abdominal tenderness, no masses  palpated. No hepatosplenomegaly. Bowel sounds positive.  Musculoskeletal: no clubbing / cyanosis. No joint deformity upper and lower extremities. Good ROM, no contractures. Normal muscle tone.  Skin: no rashes, lesions, ulcers. No induration Neurologic: No apparent cranial nerve abnormality, moving extremities spontaneously.  Psychiatric: Normal judgment and insight. Alert and oriented x 3. Normal mood.   Labs on Admission: I have personally reviewed following labs and imaging studies  CBC:  Recent Labs  Lab 06/06/20 1135  WBC 10.4  NEUTROABS 5.0  HGB 16.0*  HCT 49.8*  MCV 81.0  PLT 352*   Basic Metabolic Panel: Recent Labs  Lab 06/06/20 1135  NA 138  K 3.2*  CL 107  CO2 20*  GLUCOSE 102*  BUN 11  CREATININE 0.69  CALCIUM 8.0*   Liver Function Tests: Recent Labs  Lab 06/06/20 1135  AST 89*  ALT 79*  ALKPHOS 241*  BILITOT 0.5  PROT 5.5*  ALBUMIN 2.6*   Recent Labs  Lab 06/06/20 1135  LIPASE 24    Radiological Exams on Admission: DG Chest Port 1 View  Result Date: 06/06/2020 CLINICAL DATA:  Chest pain beginning yesterday. EXAM: PORTABLE CHEST 1 VIEW COMPARISON:  12/31/2017 FINDINGS: The heart size and mediastinal contours are within normal limits. Both lungs are clear. No evidence of pneumothorax or pleural effusion. IMPRESSION: No active disease. Electronically Signed   By: Marlaine Hind M.D.   On: 06/06/2020 12:52    EKG: Independently reviewed.  Sinus rhythm rate 80, QTc 445.  No significant ST or T wave changes compared to prior EKG.  Assessment/Plan Principal Problem:   Chest pain Active Problems:   Fatty liver disease, nonalcoholic   Chronic diarrhea, with Celiac disease   Chest pain-atypical.  Troponin and EKG so far unremarkable.  History of ST elevation MI 2019 with DES to RCA.  Echo 01/2020-EF 65 to 70% with normal LV diastolic parameters, no RWMA.  Patient reports symptoms today are similar to prior MI symptoms. -Nitro, IV morphine as  needed -Trend troponin -EKG in the - Cardiology consultation -Resume aspirin, statin, Metoprolol 25 twice daily  Diarrhea-of 1 week.  Also with vomiting, cough congestion. Possibly viral etiology.  Afebrile without leukocytosis.  History of chronic diarrhea with celiac disease- her celiac's disease is controlled, also she has barely eaten over the past week, yet having diarrhea. -Stool C. difficile, GI pathogen panel - N/s 50 cc/hr + 40 KCL x 15hrs -May benefit from Imodium if stool studies normal. - Clear Liquid diet, advance as tolerated  Hypokalemia-potassium 3.2. - Replete - Check magnesium  Elevated liver enzymes, fatty liver disease-chronic liver elevation- AST 89, ALT 79, ALP 241, total bilirubin normal at 0.5.  CT from 2019 shows hepatic steatosis, s/p cholecystectomy.  Lower extremity swelling- R> L.  Recent right LE venous Dopplers negative for DVT 04/2020. -Hold torsemide for now, with GI losses  DVT prophylaxis: Lovenox Code Status: Full code Family Communication: Son at bedside. Disposition Plan: ~  1 - 2 days Consults called: Cardiology Admission status: Obs tele   Bethena Roys MD [facility]  06/06/2020, 4:57 PM

## 2020-06-06 NOTE — Progress Notes (Signed)
Chest pain rated zero at this time.  Ambulated to bathroom but only voided. Stated has not had a bm since 1100 this morning.

## 2020-06-07 ENCOUNTER — Other Ambulatory Visit: Payer: Self-pay

## 2020-06-07 DIAGNOSIS — R079 Chest pain, unspecified: Secondary | ICD-10-CM

## 2020-06-07 LAB — GASTROINTESTINAL PANEL BY PCR, STOOL (REPLACES STOOL CULTURE)

## 2020-06-07 LAB — HEPATIC FUNCTION PANEL
ALT: 70 U/L — ABNORMAL HIGH (ref 0–44)
AST: 74 U/L — ABNORMAL HIGH (ref 15–41)
Albumin: 2.2 g/dL — ABNORMAL LOW (ref 3.5–5.0)
Alkaline Phosphatase: 223 U/L — ABNORMAL HIGH (ref 38–126)
Bilirubin, Direct: 0.1 mg/dL (ref 0.0–0.2)
Indirect Bilirubin: 0.3 mg/dL (ref 0.3–0.9)
Total Bilirubin: 0.4 mg/dL (ref 0.3–1.2)
Total Protein: 4.5 g/dL — ABNORMAL LOW (ref 6.5–8.1)

## 2020-06-07 LAB — BASIC METABOLIC PANEL
Anion gap: 10 (ref 5–15)
BUN: 12 mg/dL (ref 6–20)
CO2: 21 mmol/L — ABNORMAL LOW (ref 22–32)
Calcium: 7.8 mg/dL — ABNORMAL LOW (ref 8.9–10.3)
Chloride: 110 mmol/L (ref 98–111)
Creatinine, Ser: 0.67 mg/dL (ref 0.44–1.00)
GFR, Estimated: >60 mL/min (ref >60)
Glucose, Bld: 93 mg/dL (ref 70–99)
Potassium: 3.3 mmol/L — ABNORMAL LOW (ref 3.5–5.1)
Sodium: 141 mmol/L (ref 135–145)

## 2020-06-07 LAB — SARS CORONAVIRUS 2 (TAT 6-24 HRS): SARS Coronavirus 2: NEGATIVE

## 2020-06-07 LAB — HIV ANTIBODY (ROUTINE TESTING W REFLEX): HIV Screen 4th Generation wRfx: NONREACTIVE

## 2020-06-07 LAB — D-DIMER, QUANTITATIVE: D-Dimer, Quant: 0.45 ug/mL-FEU (ref 0.00–0.50)

## 2020-06-07 MED ORDER — POTASSIUM CHLORIDE CRYS ER 20 MEQ PO TBCR
40.0000 meq | EXTENDED_RELEASE_TABLET | Freq: Once | ORAL | Status: AC
  Administered 2020-06-07: 40 meq via ORAL
  Filled 2020-06-07: qty 2

## 2020-06-07 MED ORDER — DULOXETINE HCL 20 MG PO CPEP: 20.0000 mg | ORAL_CAPSULE | Freq: Every day | ORAL | Status: DC

## 2020-06-07 MED ORDER — LOPERAMIDE HCL 2 MG PO CAPS
4.0000 mg | ORAL_CAPSULE | Freq: Once | ORAL | Status: AC
  Administered 2020-06-07: 4 mg via ORAL
  Filled 2020-06-07: qty 2

## 2020-06-07 MED ORDER — DULOXETINE HCL 60 MG PO CPEP: 60.0000 mg | ORAL_CAPSULE | Freq: Every day | ORAL | Status: DC

## 2020-06-07 MED ORDER — LOPERAMIDE HCL 2 MG PO CAPS: 2.0000 mg | ORAL_CAPSULE | Freq: Three times a day (TID) | ORAL | 0 refills | Status: DC | PRN

## 2020-06-07 MED ORDER — LOPERAMIDE HCL 2 MG PO CAPS
2.0000 mg | ORAL_CAPSULE | Freq: Three times a day (TID) | ORAL | Status: DC | PRN
  Administered 2020-06-07: 2 mg via ORAL
  Filled 2020-06-07: qty 1

## 2020-06-07 NOTE — Consult Note (Addendum)
Cardiology Consultation:   Patient ID: Sharon Lawrence MRN: [id number]; DOB: 1966/03/02  Admit date: 06/06/2020 Date of Consult: 06/07/2020  PCP:  Sharon Schwalbe, MD   [place]  Cardiologist:  Sharon Dolly, MD  Advanced Practice Provider:  No care team member to display Electrophysiologist:  None  [phone]   Patient Profile:   Sharon Lawrence is a 55 y.o. female with a hx of CAD who is being seen today for the evaluation of chest pain at the request of Dr Sharon Lawrence.  History of Present Illness:   Sharon Lawrence 55 yo female history of CAD with prior STEMI in 09/2017 with PCI to RCA, hyperlipidemia, LE edema presents with chest pain.  She reports 3 weeks ago developing nausea and vomiting, frequent dry heaves that is ongoing. After having these symptoms for some time began also having squeezing like pain midchest 6-7/10 lasting seconds to a few minutes. Could occur at rest or with exertion, no specific trigger. No other specific associated symptoms. Not better with NG. Symptoms were similar to when she presented with her STEMI in 2019   WBC 10.4 Plt 509 Hgb 16 K 3.2 BUN 11 Cr 0.69 AST 89 ALT 79 Alk phos 241 lipase 24 Mg 2  Trop 6-->6-->5 COVID neg CXR no acute process EKG SR, no specific ischemic changes     Past Medical History:  Diagnosis Date  . ADHD (attention deficit hyperactivity disorder)   . Anxiety   . B12 deficiency 02/07/2011  . Back pain   . CAD (coronary artery disease)    a. s/p recent STEMI on 09/15/2017 with DES to RCA  . Celiac disease   . Folic acid deficiency 62/11/4763  . Headache   . Iron deficiency anemia 02/07/2011  . Myocardial infarction ([place]) 09/15/2017    Past Surgical History:  Procedure Laterality Date  . CHOLECYSTECTOMY    . COLONOSCOPY WITH PROPOFOL N/A 09/26/2017   Procedure: COLONOSCOPY WITH PROPOFOL;  Surgeon: Rogene Houston, MD;  [facility];  Service: Endoscopy;  Laterality: N/A;  . CORONARY STENT  INTERVENTION Right 09/15/2017   Procedure: CORONARY STENT INTERVENTION;  Surgeon: Jettie Booze, MD;  Location: [facility];  Service: Cardiovascular;  Laterality: Right;  RCA  . KNEE CARTILAGE SURGERY Right   . LEFT HEART CATH AND CORONARY ANGIOGRAPHY N/A 09/15/2017   Procedure: LEFT HEART CATH AND CORONARY ANGIOGRAPHY;  Surgeon: Jettie Booze, MD;  Location: [facility];  Service: Cardiovascular;  Laterality: N/A;  . TONSILLECTOMY       Inpatient Medications: Scheduled Meds: . aspirin  81 mg Oral Daily  . atorvastatin  40 mg Oral QODAY  . DULoxetine  20 mg Oral QHS  . DULoxetine  60 mg Oral QHS  . enoxaparin (LOVENOX) injection  40 mg Subcutaneous Q24H  . famotidine  40 mg Oral QHS  . metoprolol tartrate  25 mg Oral BID  . pantoprazole  40 mg Oral QAC breakfast  . potassium chloride  40 mEq Oral Once  . pregabalin  150 mg Oral BID   Continuous Infusions: . 0.9 % NaCl with KCl 40 mEq / L Stopped (06/07/20 0900)   PRN Meds: acetaminophen **OR** acetaminophen, loperamide, nitroGLYCERIN, ondansetron **OR** ondansetron (ZOFRAN) IV, oxyCODONE  Allergies:    Allergies  Allergen Reactions  . Bee Venom Anaphylaxis  . Erythromycin Anaphylaxis  . Cephalexin   . Neomycin-Bacitracin-Polymyxin  [Bacitracin-Neomycin-Polymyxin] Swelling    Sight of application  . Neosporin Original [Bacitracin-Neomycin-Polymyxin]  Swelling    Swelling at site of application  . Prednisone   . Sulfa Antibiotics Swelling    Sulfa eye drops caused eyes to swell Sulfa eye drops caused eyes to swell  . Tobrex [Tobramycin] Swelling  . Tramadol Hives  . Penicillins Rash    Has patient had a PCN reaction causing immediate rash, facial/tongue/throat swelling, SOB or lightheadedness with hypotension: Yes Has patient had a PCN reaction causing severe rash involving mucus membranes or skin necrosis: No Has patient had a PCN reaction that required hospitalization: No Has patient had a PCN  reaction occurring within the last 10 years: No If all of the above answers are "NO", then may proceed with Cephalosporin use.     Social History:   Social History   Socioeconomic History  . Marital status: Widowed    Spouse name: Not on file  . Number of children: 3  . Years of education: Not on file  . Highest education level: Associate degree: occupational, Hotel manager, or vocational program  Occupational History  . Not on file  Tobacco Use  . Smoking status: Former Smoker    Packs/day: 0.25    Years: 12.00    Pack years: 3.00    Types: Cigarettes    Quit date: 04/04/2017    Years since quitting: 3.1  . Smokeless tobacco: Never Used  Vaping Use  . Vaping Use: Never used  Substance and Sexual Activity  . Alcohol use: No  . Drug use: No  . Sexual activity: Not Currently  Other Topics Concern  . Not on file  Social History Narrative  . Not on file   Social Determinants of Health   Financial Resource Strain: Not on file  Food Insecurity: Not on file  Transportation Needs: Not on file  Physical Activity: Not on file  Stress: Not on file  Social Connections: Not on file  Intimate Partner Violence: Not on file    Family History:    Family History  Problem Relation Age of Onset  . Cancer Mother   . Hypertension Mother   . Hypertension Father   . Hypothyroidism Father   . Cancer Sister   . Anxiety disorder Sister   . Depression Sister   . CAD Maternal Grandfather   . Cirrhosis Paternal Grandfather   . CAD Paternal Grandmother   . Diabetes Paternal Grandmother   . Hypothyroidism Paternal Grandmother   . Drug abuse Son   . Bipolar disorder Son      ROS:  Please see the history of present illness.  All other ROS reviewed and negative.     Physical Exam/Data:   Vitals:   06/06/20 2044 06/07/20 0136 06/07/20 0609 06/07/20 0637  BP: 133/84 126/68 (!) 91/59 107/75  Pulse: 86 80 75 74  Resp: _0 Temp: 98 F (36.7 C) 98.2 F (36.8 C) 98.5 F (36.9  C)   TempSrc:  Oral Oral   SpO2: 98% 99% 97%   Weight:      Height:        Intake/Output Summary (Last 24 hours) at 06/07/2020 0933 Last data filed at 06/06/2020 2043 Gross per 24 hour  Intake 60 ml  Output --  Net 60 ml   Last 3 Weights 06/06/2020 06/06/2020 05/15/2020  Weight (lbs) 238 lb 8.6 oz 229 lb 15 oz 200 lb  Weight (kg) 108.2 kg 104.3 kg 90.719 kg  Some encounter information is confidential and restricted. Go to Review Flowsheets activity to see all  data.     Body mass index is 46.59 kg/m.  General:  Well nourished, well developed, in no acute distress HEENT: normal Lymph: no adenopathy Neck: no JVD Endocrine:  No thryomegaly Vascular: No carotid bruits; FA pulses 2+ bilaterally without bruits  Cardiac:  normal S1, S2; RRR; no murmur  Lungs:  clear to auscultation bilaterally, no wheezing, rhonchi or rales  Abd: soft, nontender, no hepatomegaly  Ext: trace bilateral edema Musculoskeletal:  No deformities, BUE and BLE strength normal and equal Skin: warm and dry  Neuro:  CNs 2-12 intact, no focal abnormalities noted Psych:  Normal affect     Laboratory Data:  High Sensitivity Troponin:   Recent Labs  Lab 06/06/20 1335 06/06/20 1638 06/06/20 2124  TROPONINIHS _0 Chemistry Recent Labs  Lab 06/06/20 1135 06/07/20 0542  NA 138 141  K 3.2* 3.3*  CL 107 110  CO2 20* 21*  GLUCOSE 102* 93  BUN 11 12  CREATININE 0.69 0.67  CALCIUM 8.0* 7.8*  GFRNONAA >60 >60  ANIONGAP 11 10    Recent Labs  Lab 06/06/20 1135  PROT 5.5*  ALBUMIN 2.6*  AST 89*  ALT 79*  ALKPHOS 241*  BILITOT 0.5   Hematology Recent Labs  Lab 06/06/20 1135  WBC 10.4  RBC 6.15*  HGB 16.0*  HCT 49.8*  MCV 81.0  MCH 26.0  MCHC 32.1  RDW 17.2*  PLT 509*   BNPNo results for input(s): BNP, PROBNP in the last 168 hours.  DDimer No results for input(s): DDIMER in the last 168 hours.   Radiology/Studies:  DG Chest Port 1 View  Result Date: 06/06/2020 CLINICAL DATA:   Chest pain beginning yesterday. EXAM: PORTABLE CHEST 1 VIEW COMPARISON:  12/31/2017 FINDINGS: The heart size and mediastinal contours are within normal limits. Both lungs are clear. No evidence of pneumothorax or pleural effusion. IMPRESSION: No active disease. Electronically Signed   By: Marlaine Hind M.D.   On: 06/06/2020 12:52     Assessment and Plan:   1. CAD/Chest pain - history of STEMI in 2019 with PCI to RCA - presents with atypical chest pain in the setting of several weeks of nausea/vomiting/dry heaves/diarrhea - there is no objective evidence of ischemia by EKG or enzymes.  - would plan for outpatient lexiscan 2 day protocol, no further inpatient cardiac workup is planned  2. Nausea/vomiting/diarrhea - management per primary team.   We will sign off inpatient care, we will arrange outpatient stress test and f/u   For questions or updates, please contact [facility] Please consult [email or web address] for contact info under    Signed, Sharon Dolly, MD  06/07/2020 9:33 AM

## 2020-06-07 NOTE — Discharge Summary (Signed)
Physician Discharge Summary  Sharon Lawrence QZR:[id number] DOB: 07/13/1965  PCP: Vidal Schwalbe, MD  Admitted from: Home Discharged to: Home  Admit date: 06/06/2020 Discharge date: 06/07/2020  Recommendations for Outpatient Follow-up:    Follow-up Information    Erma Heritage, PA-C Follow up on 06/27/2020.   Specialties: Physician Assistant, Cardiology Why: Cardiology Hospital Follow-up on 06/27/2020 at 2:30 PM. The office will contact you to schedule a stress test prior to your visit.  Contact information: [address] [place] [address] [phone]        Vidal Schwalbe, MD. Schedule an appointment as soon as possible for a visit in 5 day(s).   Specialty: Family Medicine Why: To be seen with repeat labs (CBC & CMP). Contact information: [address] [place] [address] [phone]        Arnoldo Lenis, MD .   Specialty: Cardiology Contact information: [address] [place] [address] [phone]        Rogene Houston, MD. Schedule an appointment as soon as possible for a visit.   Specialty: Gastroenterology Why: Kindly call for an appointment to be seen as soon as possible.  This is to evaluate for recent onset of diarrhea, intermittent nausea, vomiting and dry heaves. Contact information: [place] [address] [phone]                Home Health: None    Equipment/Devices: None    Discharge Condition: Improved and stable   Code Status: Full Code Diet recommendation:  Discharge Diet Orders (From admission, onward)    Start     Ordered   06/07/20 0000  Diet - low sodium heart healthy        06/07/20 1202           Discharge Diagnoses:  Principal Problem:   Chest pain Active Problems:   Fatty liver disease, nonalcoholic   Chronic diarrhea, with Celiac disease   Brief Summary: 55 year old female with medical history significant for but not limited to celiac disease, obesity, coronary  artery disease, presented to the ED with complaints of chest pain of 2 weeks duration.  She ruled out for ACS.  Cardiology was consulted, cleared her for discharge and have arranged outpatient follow-up along with a stress test.  HPI and ED course as per Dr.Ejiroghene Emokpae from 06/06/2020 as copied below:  HPI: Sharon Lawrence is a 55 y.o. female with medical history significant for celiac's disease, obesity, coronary artery disease. Patient presented to the ED with complaints of chest pain of 2 weeks duration.  She describes midsternal chest pain as squeezing, intermittent, several episodes over the past 2 weeks, usually lasting about 5 minutes.  Last night she had the worst episode of chest pain, lasted about an hour.  Chest pain woke her up from sleep.  Her chest pain and not related to activity.  She reports difficulty breathing this morning with mild activity-bathing herself. Patient also reports nausea with multiple episodes of vomiting especially after every meal started before onset of chest pain.  Her GI symptoms do not exactly correlate with chest pain symptoms. For the past week she has had several episodes of watery stools, daily up to 8 times a day.  Stools are nonbloody.  She reports intermittent crampy lower abdominal pain before loose stools. She denies dysuria, reports cough and congestion over the past 6 days. Before onset of the symptoms, she reports compliance with her aspirin,  and torsemide.  Patient reports her symptoms of initial nausea and vomiting and then later- subsequent chest pains, are exactly consistent with her symptoms when she had a heart attack and stents placed in 2019.  Since then she has done well until now.  Quit smoking cigarettes 3 years ago.  ED Course: Stable vitals heart rate 70s to 80s.  Blood pressure systolic 1 teens to 314H.  O2 sats greater than 97% on room air. Troponin 6.  EKG without significant ST or T wave changes.  Mild elevation in liver enzymes  consistent with prior.  Repeat chest x-ray without acute abnormality.  Improvement in symptoms after nitro was given in ED. EDP talked to cardiologist Dr. Harl Bowie, agreed with admission for observation,.   Assessment and plan:  1. Atypical chest pain/CAD: Cardiology consulted and assisted with evaluation.  She has history of CAD with prior STEMI in 09/2017 with PCI to RCA.  Ruled out for ACS with EKG without acute ischemic findings and HS troponin x3: Negative (6 > 6 greater than 5).  Given her reported sedentary lifestyle, some reported dyspnea, checked D-dimer which was negative.  Cardiology indicated that she presented with atypical chest pain in the setting of several weeks of nausea, vomiting, dry heaves and diarrhea without objective evidence of ischemia by EKG or enzymes.  They have arranged for outpatient follow-up including Lexiscan 2-day protocol and cleared her for discharge home.  No recurrence of chest pains.  Continue prior home dose of aspirin, statins and metoprolol.   2. Nausea, vomiting, diarrhea: Unclear etiology.  10 days duration.  Nausea and vomiting have significantly improved or even resolved.  Diarrhea has improved but still has some, better after Imodium.  No clear trigger.  Denies eating out or eating anything unusual.  No family history of similar complaints.  States that her celiac disease is not usually associated with the symptoms.  Recommend close outpatient follow-up with her primary GI/Dr. Laural Golden upon discharge and if symptoms do not resolve then may need further evaluation.  She verbalized understanding.  Patient states that the Cymbalta that she is on is not new and has been on it for for approximately 3 years making it unlikely cause for her symptoms.  C. difficile testing negative.  GI panel pending and can be followed up as outpatient.  Advised to use as needed Imodium only if diarrhea severe.  Briefly treated with IV fluids.  Clinically euvolemic.  Could be viral versus  other etiologies 3. Hypokalemia: Replaced prior to discharge.  Follow BMP as outpatient.  Magnesium 2. 4. Elevated LFTs/fatty liver: S/p cholecystectomy.  Minimally elevated ALT and AST, almost similar to her numbers in July 2021.  CT abdomen from 2019 showed hepatic steatosis.  Recommended outpatient follow-up with PCP/GI. 5. Lower extremity swelling, R >L: Denies any worsening.  Recent right lower extremity venous Doppler on 04/19/2020: Negative for DVT.  D-dimer negative this admission.  Takes diuretics only as needed. 6. GERD: Continue PPI and Pepcid.  Uses Zofran initially and if that does not work then uses Phenergan rarely. 7. Hyperlipidemia: Continue statins   Consultations:  Cardiology  Procedures:  None   Discharge Instructions  Discharge Instructions    Call MD for:  difficulty breathing, headache or visual disturbances   Complete by: As directed    Call MD for:  extreme fatigue   Complete by: As directed    Call MD for:  persistant dizziness or light-headedness   Complete by: As directed    Call  MD for:  persistant nausea and vomiting   Complete by: As directed    Or persisting or worsening diarrhea.   Call MD for:  severe uncontrolled pain   Complete by: As directed    Call MD for:  temperature >100.4   Complete by: As directed    Diet - low sodium heart healthy   Complete by: As directed    Increase activity slowly   Complete by: As directed        Medication List    STOP taking these medications   clonazePAM 0.5 MG tablet Commonly known as: KlonoPIN   methocarbamol 500 MG tablet Commonly known as: ROBAXIN     TAKE these medications   aspirin 81 MG chewable tablet Chew 1 tablet (81 mg total) by mouth daily.   atorvastatin 40 MG tablet Commonly known as: LIPITOR Take 1 tablet (40 mg total) by mouth every other day.   baclofen 20 MG tablet Commonly known as: LIORESAL Take by mouth at bedtime.   DULoxetine 20 MG capsule Commonly known as:  CYMBALTA TAKE (1) CAPSULE BY MOUTH ONCE DAILY. *TAKE WITH 60MG CAP* What changed: See the new instructions.   DULoxetine 60 MG capsule Commonly known as: CYMBALTA TAKE (1) CAPSULE BY MOUTH ONCE DAILY. What changed: Another medication with the same name was changed. Make sure you understand how and when to take each.   famotidine 40 MG tablet Commonly known as: PEPCID Take 40 mg by mouth at bedtime.   fluticasone 50 MCG/ACT nasal spray Commonly known as: FLONASE Place 1 spray into both nostrils daily. As needed.   folic acid 1 MG tablet Commonly known as: FOLVITE Take 1 mg by mouth 2 (two) times a day.   ibuprofen 800 MG tablet Commonly known as: ADVIL 800 mg every 6 (six) hours as needed.   lactobacillus acidophilus Tabs tablet Take 1 tablet by mouth daily.   levonorgestrel 20 MCG/24HR IUD Commonly known as: MIRENA by Intrauterine route.   loperamide 2 MG capsule Commonly known as: IMODIUM Take 1 capsule (2 mg total) by mouth 3 (three) times daily as needed for diarrhea or loose stools.   metoprolol tartrate 25 MG tablet Commonly known as: LOPRESSOR Take 1 tablet (25 mg total) by mouth 2 (two) times daily.   nitroGLYCERIN 0.4 MG/SPRAY spray Commonly known as: NITROLINGUAL Place 2 sprays under the tongue every 5 (five) minutes x 3 doses as needed. What changed: Another medication with the same name was removed. Continue taking this medication, and follow the directions you see here.   nystatin powder Commonly known as: MYCOSTATIN/NYSTOP Apply topically.   ondansetron 8 MG disintegrating tablet Commonly known as: ZOFRAN-ODT Take 1 tablet by mouth 3 (three) times daily as needed for nausea.   oxyCODONE 5 MG immediate release tablet Commonly known as: Oxy IR/ROXICODONE Take 1 tablet (5 mg total) by mouth every 6 (six) hours as needed for severe pain. Must last 30 days.   pantoprazole 40 MG tablet Commonly known as: PROTONIX TAKE 1 TABLET BY MOUTH ONCE DAILY  BEFORE BREAKFAST.   pregabalin 150 MG capsule Commonly known as: LYRICA Take 150 mg by mouth in the morning and at bedtime.   promethazine 25 MG tablet Commonly known as: PHENERGAN Take 25 mg by mouth 3 (three) times daily as needed for nausea.   rizatriptan 10 MG tablet Commonly known as: MAXALT Take 10 mg by mouth as needed.   torsemide 20 MG tablet Commonly known as: DEMADEX 20 mg once. As  needed.   VITAMIN B-12 IJ Inject as directed every 30 (thirty) days.   Vitamin D (Ergocalciferol) 1.25 MG (50000 UNIT) Caps capsule Commonly known as: DRISDOL Take 50,000 Units by mouth every 7 (seven) days. Wednesday      Allergies  Allergen Reactions  . Bee Venom Anaphylaxis  . Erythromycin Anaphylaxis  . Cephalexin   . Neomycin-Bacitracin-Polymyxin  [Bacitracin-Neomycin-Polymyxin] Swelling    Sight of application  . Neosporin Original [Bacitracin-Neomycin-Polymyxin] Swelling    Swelling at site of application  . Prednisone   . Sulfa Antibiotics Swelling    Sulfa eye drops caused eyes to swell Sulfa eye drops caused eyes to swell  . Tobrex [Tobramycin] Swelling  . Tramadol Hives  . Penicillins Rash    Has patient had a PCN reaction causing immediate rash, facial/tongue/throat swelling, SOB or lightheadedness with hypotension: Yes Has patient had a PCN reaction causing severe rash involving mucus membranes or skin necrosis: No Has patient had a PCN reaction that required hospitalization: No Has patient had a PCN reaction occurring within the last 10 years: No If all of the above answers are "NO", then may proceed with Cephalosporin use.       Procedures/Studies: DG Chest Port 1 View  Result Date: 06/06/2020 CLINICAL DATA:  Chest pain beginning yesterday. EXAM: PORTABLE CHEST 1 VIEW COMPARISON:  12/31/2017 FINDINGS: The heart size and mediastinal contours are within normal limits. Both lungs are clear. No evidence of pneumothorax or pleural effusion. IMPRESSION: No active  disease. Electronically Signed   By: Marlaine Hind M.D.   On: 06/06/2020 12:52      Subjective: No recurrence of chest pain.  Denies recent long distance travel.  Denies worsening of lower extremity swelling or pain.  No dyspnea at this time.  Nausea and vomiting have improved.  Appetite still not back to baseline.  Diarrhea better after Imodium overnight but had 2-3 stools since a.m. without abdominal pain.  No blood or mucus.  Denies dizziness or lightheadedness.  Discharge Exam:  Vitals:   06/06/20 2044 06/07/20 0136 06/07/20 0609 06/07/20 0637  BP: 133/84 126/68 (!) 91/59 107/75  Pulse: 86 80 75 74  Resp: 19 19 18    Temp: 98 F (36.7 C) 98.2 F (36.8 C) 98.5 F (36.9 C)   TempSrc:  Oral Oral   SpO2: 98% 99% 97%   Weight:      Height:        General: Young female, moderately built and nourished lying comfortably propped up in bed without distress.  Oral mucosa moist. Cardiovascular: S1 & S2 heard, RRR, S1/S2 +. No murmurs, rubs, gallops or clicks. No JVD or pedal edema.  Telemetry personally reviewed: Sinus rhythm. Respiratory: Clear to auscultation without wheezing, rhonchi or crackles. No increased work of breathing. Abdominal:  Non distended, non tender & soft. No organomegaly or masses appreciated. Normal bowel sounds heard. CNS: Alert and oriented. No focal deficits. Extremities: no edema, no cyanosis    The results of significant diagnostics from this hospitalization (including imaging, microbiology, ancillary and laboratory) are listed below for reference.     Microbiology: Recent Results (from the past 240 hour(s))  SARS CORONAVIRUS 2 (TAT 6-24 HRS) Nasopharyngeal Nasopharyngeal Swab     Status: None   Collection Time: 06/06/20  3:42 PM   Specimen: Nasopharyngeal Swab  Result Value Ref Range Status   SARS Coronavirus 2 NEGATIVE NEGATIVE Final    Comment: (NOTE) SARS-CoV-2 target nucleic acids are NOT DETECTED.  The SARS-CoV-2 RNA is generally detectable in  upper and lower respiratory specimens during the acute phase of infection. Negative results do not preclude SARS-CoV-2 infection, do not rule out co-infections with other pathogens, and should not be used as the sole basis for treatment or other patient management decisions. Negative results must be combined with clinical observations, patient history, and epidemiological information. The expected result is Negative.  Fact Sheet for Patients: SugarRoll.be  Fact Sheet for Healthcare Providers: https://www.woods-mathews.com/  This test is not yet approved or cleared by the [place] FDA and  has been authorized for detection and/or diagnosis of SARS-CoV-2 by FDA under an Emergency Use Authorization (EUA). This EUA will remain  in effect (meaning this test can be used) for the duration of the COVID-19 declaration under Se ction 564(b)(1) of the Act, 21 U.S.C. section 360bbb-3(b)(1), unless the authorization is terminated or revoked sooner.  Performed at [facility], [place] [address] [place], [place] [address]   C Difficile Quick Screen w PCR reflex     Status: None   Collection Time: 06/06/20  9:02 PM   Specimen: STOOL  Result Value Ref Range Status   C Diff antigen NEGATIVE NEGATIVE Final   C Diff toxin NEGATIVE NEGATIVE Final   C Diff interpretation No C. difficile detected.  Final    Comment: Performed at [facility], [address] [place], [place] [address]     Labs: CBC: Recent Labs  Lab 06/06/20 1135  WBC 10.4  NEUTROABS 5.0  HGB 16.0*  HCT 49.8*  MCV 81.0  PLT 509*    Basic Metabolic Panel: Recent Labs  Lab 06/06/20 1135 06/06/20 1745 06/07/20 0542  NA 138  --  141  K 3.2*  --  3.3*  CL 107  --  110  CO2 20*  --  21*  GLUCOSE 102*  --  93  BUN 11  --  12  CREATININE 0.69  --  0.67  CALCIUM 8.0*  --  7.8*  MG  --  2.0  --     Liver Function Tests: Recent Labs  Lab 06/06/20 1135  AST 89*  ALT  79*  ALKPHOS 241*  BILITOT 0.5  PROT 5.5*  ALBUMIN 2.6*      Time coordinating discharge: 25 minutes  SIGNED:  Vernell Leep, MD, FACP, [facility]. [facility]  To contact the attending provider between 7A-7P or the covering provider during after hours 7P-7A, please log into the web site [email or web address] and access using universal [place] password for that web site. If you do not have the password, please call the hospital operator.

## 2020-06-07 NOTE — Plan of Care (Signed)

## 2020-06-07 NOTE — Discharge Instructions (Signed)

## 2020-06-07 NOTE — Progress Notes (Signed)
2 day out patient Lexiscan Myoview per Dr Harl Bowie for Chest Pains

## 2020-06-08 ENCOUNTER — Telehealth: Payer: Self-pay

## 2020-06-08 NOTE — Telephone Encounter (Signed)
-----   Message from Erma Heritage, [place] sent at 06/07/2020 10:08 AM EDT ----- Regarding: Outpatient Stress Test Good morning,   This patient needs a 2-day outpatient Lexiscan Myoview per Dr. Harl Bowie for chest pain. Should be discharged from the hospital later today or tomorrow so would call then to schedule. I did schedule her a hospital follow-up visit on 4/26 so if her stress test is not performed prior to that, please reschedule her visit.   Thanks,  Sharon Lawrence

## 2020-06-08 NOTE — Telephone Encounter (Signed)
Tried to reach pt for 2 day myoview ,  No answer no vm

## 2020-06-12 ENCOUNTER — Encounter: Payer: Self-pay | Admitting: Pain Medicine

## 2020-06-12 NOTE — Progress Notes (Deleted)
No show

## 2020-06-13 DIAGNOSIS — Z79891 Long term (current) use of opiate analgesic: Secondary | ICD-10-CM | POA: Insufficient documentation

## 2020-06-13 DIAGNOSIS — F1120 Opioid dependence, uncomplicated: Secondary | ICD-10-CM | POA: Insufficient documentation

## 2020-06-14 ENCOUNTER — Encounter: Payer: Self-pay | Admitting: Pain Medicine

## 2020-06-14 DIAGNOSIS — F1120 Opioid dependence, uncomplicated: Secondary | ICD-10-CM

## 2020-06-14 DIAGNOSIS — M5441 Lumbago with sciatica, right side: Secondary | ICD-10-CM

## 2020-06-14 DIAGNOSIS — Z79891 Long term (current) use of opiate analgesic: Secondary | ICD-10-CM

## 2020-06-14 DIAGNOSIS — M62830 Muscle spasm of back: Secondary | ICD-10-CM

## 2020-06-14 DIAGNOSIS — G8929 Other chronic pain: Secondary | ICD-10-CM

## 2020-06-14 DIAGNOSIS — M47816 Spondylosis without myelopathy or radiculopathy, lumbar region: Secondary | ICD-10-CM

## 2020-06-14 DIAGNOSIS — G894 Chronic pain syndrome: Secondary | ICD-10-CM

## 2020-06-14 DIAGNOSIS — Z79899 Other long term (current) drug therapy: Secondary | ICD-10-CM

## 2020-06-20 NOTE — Telephone Encounter (Signed)
lmom to call back to schedule stress test

## 2020-06-22 ENCOUNTER — Telehealth: Payer: Self-pay | Admitting: Student

## 2020-06-23 ENCOUNTER — Telehealth: Payer: Self-pay | Admitting: Student

## 2020-06-23 ENCOUNTER — Telehealth: Payer: Self-pay | Admitting: Cardiology

## 2020-06-23 NOTE — Telephone Encounter (Signed)
Appointment needs to be scheduled. Left mess 4/21 for patient to call.

## 2020-06-23 NOTE — Telephone Encounter (Signed)
Arrival time changed from 7am to 8:15am for lexi scan per Silva Bandy at [facility]

## 2020-06-25 ENCOUNTER — Emergency Department (HOSPITAL_COMMUNITY): Payer: Self-pay

## 2020-06-25 ENCOUNTER — Emergency Department (HOSPITAL_COMMUNITY)
Admission: EM | Admit: 2020-06-25 | Discharge: 2020-06-25 | Disposition: A | Discharge status: Home / Self Care | Payer: Self-pay | Attending: Emergency Medicine | Admitting: Emergency Medicine

## 2020-06-25 ENCOUNTER — Encounter (HOSPITAL_COMMUNITY): Payer: Self-pay

## 2020-06-25 ENCOUNTER — Other Ambulatory Visit: Payer: Self-pay

## 2020-06-25 DIAGNOSIS — Z7982 Long term (current) use of aspirin: Secondary | ICD-10-CM | POA: Insufficient documentation

## 2020-06-25 DIAGNOSIS — E039 Hypothyroidism, unspecified: Secondary | ICD-10-CM | POA: Insufficient documentation

## 2020-06-25 DIAGNOSIS — Z86018 Personal history of other benign neoplasm: Secondary | ICD-10-CM | POA: Insufficient documentation

## 2020-06-25 DIAGNOSIS — E876 Hypokalemia: Secondary | ICD-10-CM | POA: Insufficient documentation

## 2020-06-25 DIAGNOSIS — Z79899 Other long term (current) drug therapy: Secondary | ICD-10-CM | POA: Insufficient documentation

## 2020-06-25 DIAGNOSIS — I2510 Atherosclerotic heart disease of native coronary artery without angina pectoris: Secondary | ICD-10-CM | POA: Insufficient documentation

## 2020-06-25 DIAGNOSIS — Z87891 Personal history of nicotine dependence: Secondary | ICD-10-CM | POA: Insufficient documentation

## 2020-06-25 DIAGNOSIS — J4540 Moderate persistent asthma, uncomplicated: Secondary | ICD-10-CM | POA: Insufficient documentation

## 2020-06-25 DIAGNOSIS — R1030 Lower abdominal pain, unspecified: Secondary | ICD-10-CM | POA: Insufficient documentation

## 2020-06-25 DIAGNOSIS — I10 Essential (primary) hypertension: Secondary | ICD-10-CM | POA: Insufficient documentation

## 2020-06-25 DIAGNOSIS — R112 Nausea with vomiting, unspecified: Secondary | ICD-10-CM | POA: Insufficient documentation

## 2020-06-25 DIAGNOSIS — R197 Diarrhea, unspecified: Secondary | ICD-10-CM | POA: Insufficient documentation

## 2020-06-25 LAB — COMPREHENSIVE METABOLIC PANEL
ALT: 118 U/L — ABNORMAL HIGH (ref 0–44)
AST: 209 U/L — ABNORMAL HIGH (ref 15–41)
Albumin: 2.2 g/dL — ABNORMAL LOW (ref 3.5–5.0)
Alkaline Phosphatase: 527 U/L — ABNORMAL HIGH (ref 38–126)
Anion gap: 8 (ref 5–15)
BUN: 11 mg/dL (ref 6–20)
CO2: 23 mmol/L (ref 22–32)
Calcium: 7.7 mg/dL — ABNORMAL LOW (ref 8.9–10.3)
Chloride: 107 mmol/L (ref 98–111)
Creatinine, Ser: 0.73 mg/dL (ref 0.44–1.00)
GFR, Estimated: >60 mL/min (ref >60)
Glucose, Bld: 97 mg/dL (ref 70–99)
Potassium: 2.9 mmol/L — ABNORMAL LOW (ref 3.5–5.1)
Sodium: 138 mmol/L (ref 135–145)
Total Bilirubin: 0.8 mg/dL (ref 0.3–1.2)
Total Protein: 4.7 g/dL — ABNORMAL LOW (ref 6.5–8.1)

## 2020-06-25 LAB — I-STAT CHEM 8, ED
BUN: 9 mg/dL (ref 6–20)
Calcium, Ion: 1.02 mmol/L — ABNORMAL LOW (ref 1.15–1.40)
Chloride: 107 mmol/L (ref 98–111)
Creatinine, Ser: 0.7 mg/dL (ref 0.44–1.00)
Glucose, Bld: 88 mg/dL (ref 70–99)
HCT: 44 % (ref 36.0–46.0)
Hemoglobin: 15 g/dL (ref 12.0–15.0)
Potassium: 3.4 mmol/L — ABNORMAL LOW (ref 3.5–5.1)
Sodium: 140 mmol/L (ref 135–145)
TCO2: 24 mmol/L (ref 22–32)

## 2020-06-25 LAB — CBC
HCT: 41.1 % (ref 36.0–46.0)
Hemoglobin: 13.7 g/dL (ref 12.0–15.0)
MCH: 26.3 pg (ref 26.0–34.0)
MCHC: 33.3 g/dL (ref 30.0–36.0)
MCV: 79 fL — ABNORMAL LOW (ref 80.0–100.0)
Platelets: 372 10*3/uL (ref 150–400)
RBC: 5.2 MIL/uL — ABNORMAL HIGH (ref 3.87–5.11)
RDW: 16.4 % — ABNORMAL HIGH (ref 11.5–15.5)
WBC: 7.2 10*3/uL (ref 4.0–10.5)
nRBC: 0 % (ref 0.0–0.2)

## 2020-06-25 LAB — MAGNESIUM: Magnesium: 1.7 mg/dL (ref 1.7–2.4)

## 2020-06-25 LAB — LIPASE, BLOOD: Lipase: 18 U/L (ref 11–51)

## 2020-06-25 MED ORDER — IOHEXOL 300 MG/ML  SOLN
100.0000 mL | Freq: Once | INTRAMUSCULAR | Status: AC | PRN
  Administered 2020-06-25: 100 mL via INTRAVENOUS

## 2020-06-25 MED ORDER — POTASSIUM CHLORIDE CRYS ER 20 MEQ PO TBCR: 20.0000 meq | EXTENDED_RELEASE_TABLET | Freq: Two times a day (BID) | ORAL | 0 refills | Status: DC

## 2020-06-25 MED ORDER — POTASSIUM CHLORIDE 10 MEQ/100ML IV SOLN
10.0000 meq | INTRAVENOUS | Status: AC
  Administered 2020-06-25 (×2): 10 meq via INTRAVENOUS
  Filled 2020-06-25 (×2): qty 100

## 2020-06-25 MED ORDER — SODIUM CHLORIDE 0.9 % IV BOLUS
1000.0000 mL | Freq: Once | INTRAVENOUS | Status: AC
  Administered 2020-06-25: 1000 mL via INTRAVENOUS

## 2020-06-25 MED ORDER — ONDANSETRON HCL 4 MG/2ML IJ SOLN
4.0000 mg | Freq: Once | INTRAMUSCULAR | Status: AC
  Administered 2020-06-25: 4 mg via INTRAVENOUS
  Filled 2020-06-25: qty 2

## 2020-06-25 MED ORDER — POTASSIUM CHLORIDE CRYS ER 20 MEQ PO TBCR
40.0000 meq | EXTENDED_RELEASE_TABLET | Freq: Once | ORAL | Status: AC
  Administered 2020-06-25: 40 meq via ORAL
  Filled 2020-06-25: qty 2

## 2020-06-25 NOTE — ED Provider Notes (Signed)
[facility] EMERGENCY DEPARTMENT Provider Note   CSN: [id number] Arrival date & time: 06/25/20  1215     History Chief Complaint  Patient presents with  . Abdominal Pain    Sharon Lawrence is a 55 y.o. female.  HPI      Sharon Lawrence is a 55 y.o. female with past medical history of coronary artery disease, and celiac disease, iron deficient anemia.  She presents to the Emergency Department complaining of persistent nausea vomiting and diarrhea.  Symptoms have been present for 2 months.  She states that she was admitted 2 weeks ago for chest pain and was having GI symptoms at that time.  States some tests were done but she continues to have vomiting and diarrhea.  Takes Imodium with out significant relief.  Today, she states that she has had several episodes of dry heaving, but no vomiting.  She denies fever, chills, chest pain, shortness of breath and dysuria.  No black or bloody stools or hematemesis.   Past Medical History:  Diagnosis Date  . ADHD (attention deficit hyperactivity disorder)   . Anxiety   . B12 deficiency 02/07/2011  . Back pain   . CAD (coronary artery disease)    a. s/p recent STEMI on 09/15/2017 with DES to RCA  . Celiac disease   . Folic acid deficiency 27/04/5364  . Headache   . Iron deficiency anemia 02/07/2011  . Myocardial infarction ([place]) 09/15/2017    Patient Active Problem List   Diagnosis Date Noted  . Chronic use of opiate for therapeutic purpose 06/13/2020  . Uncomplicated opioid dependence ([place]) 06/13/2020  . Chest pain 06/06/2020  . Benign neoplasm of colon 02/14/2020  . Epigastric pain 02/14/2020  . Internal derangement of right knee 02/14/2020  . Moderate persistent asthma with (acute) exacerbation 02/14/2020  . Other vitamin B12 deficiency anemias 02/14/2020  . Adjustment disorder 02/14/2020  . Panic attack 02/14/2020  . Spondylosis without myelopathy or radiculopathy, lumbosacral region 12/09/2019  . Chronic low back pain (Bilateral) w/o  sciatica 12/09/2019  . Lumbar facet synovial cyst (L5-S1) (Left) 12/09/2019  . Generalized anxiety disorder 10/06/2018  . MDD (major depressive disorder), recurrent episode, mild ([place]) 10/06/2018  . Thrombocytosis 09/30/2018  . Microcytic anemia 09/23/2018  . Polyarthralgia 09/09/2018  . Abnormal MRI, lumbar spine (07/16/2016) 12/29/2017  . DDD (degenerative disc disease), lumbar 12/29/2017  . DDD (degenerative disc disease), thoracic 12/29/2017  . Lumbar facet arthropathy (Bilateral) 12/29/2017  . Lumbar facet syndrome (Bilateral) 12/29/2017  . Long term current use of anticoagulant therapy (Plavix) 12/29/2017  . Neurogenic pain 12/29/2017  . Chronic musculoskeletal pain 12/29/2017  . Chronic low back pain (Primary Area of Pain) (Bilateral) (R>L) w/ sciatica (Right) 12/03/2017  . Chronic lower extremity pain (Secondary Area of Pain) (Right) 12/03/2017  . Chronic sacroiliac joint pain (Bilateral) (L>R) 12/03/2017  . Chronic pain syndrome 12/03/2017  . Long term current use of opiate analgesic 12/03/2017  . Pharmacologic therapy 12/03/2017  . Disorder of skeletal system 12/03/2017  . Problems influencing health status 12/03/2017  . Hypothyroid 11/06/2017  . History of ST elevation myocardial infarction (STEMI) 11/06/2017  . GI bleed 11/06/2017  . Gastro-esophageal reflux disease without esophagitis 11/06/2017  . Bleeding per rectum 11/06/2017  . Bilateral lower extremity edema 11/06/2017  . Benign essential hypertension 11/06/2017  . Back muscle spasm 11/06/2017  . Panic disorder 11/06/2017  . Anemia 11/06/2017  . Attention deficit hyperactivity disorder, combined type 11/06/2017  . Syncope and collapse 11/06/2017  . Sessile  colonic polyp 11/06/2017  . Right-sided low back pain with sciatica 11/06/2017  . RAD (reactive airway disease), moderate persistent, uncomplicated 94/17/4081  . Other chronic pain 11/06/2017  . Migraine 11/06/2017  . Major depression, single episode  11/06/2017  . Psychophysiologic insomnia 11/06/2017  . Iron deficiency anemia 11/06/2017  . Hematochezia 09/25/2017  . Heart attack ([place]) 09/25/2017  . Hypotension 09/25/2017  . Morbidly obese ([place]) 09/25/2017  . Elevated LFTs 09/25/2017  . Chronic diarrhea, with Celiac disease 09/25/2017  . Former smoker   . Hyperlipidemia 09/17/2017  . Acute ST elevation myocardial infarction (STEMI) of inferior wall ([place]) 09/15/2017  . Family hx of colon cancer 09/11/2017  . Non-intractable vomiting with nausea 09/11/2017  . Celiac disease 09/11/2017  . Influenza vaccination declined 01/01/2017  . Vaginal condyloma 06/22/2013  . Obesity 06/22/2013  . Fatty liver disease, nonalcoholic 44/81/8563  . Folic acid deficiency 14/97/0263  . Iron deficiency 02/07/2011  . B12 deficiency 02/07/2011    Past Surgical History:  Procedure Laterality Date  . CHOLECYSTECTOMY    . COLONOSCOPY WITH PROPOFOL N/A 09/26/2017   Procedure: COLONOSCOPY WITH PROPOFOL;  Surgeon: Rogene Houston, MD;  Location: [facility];  Service: Endoscopy;  Laterality: N/A;  . CORONARY STENT INTERVENTION Right 09/15/2017   Procedure: CORONARY STENT INTERVENTION;  Surgeon: Jettie Booze, MD;  Location: [place] [facility];  Service: Cardiovascular;  Laterality: Right;  RCA  . KNEE CARTILAGE SURGERY Right   . LEFT HEART CATH AND CORONARY ANGIOGRAPHY N/A 09/15/2017   Procedure: LEFT HEART CATH AND CORONARY ANGIOGRAPHY;  Surgeon: Jettie Booze, MD;  Location: [place] [facility];  Service: Cardiovascular;  Laterality: N/A;  . TONSILLECTOMY       OB History    Gravida  3   Para  3   Term      Preterm  3   AB      Living  3     SAB      IAB      Ectopic      Multiple      Live Births  3           Family History  Problem Relation Age of Onset  . Cancer Mother   . Hypertension Mother   . Hypertension Father   . Hypothyroidism Father   . Cancer Sister   . Anxiety disorder Sister   . Depression  Sister   . CAD Maternal Grandfather   . Cirrhosis Paternal Grandfather   . CAD Paternal Grandmother   . Diabetes Paternal Grandmother   . Hypothyroidism Paternal Grandmother   . Drug abuse Son   . Bipolar disorder Son     Social History   Tobacco Use  . Smoking status: Former Smoker    Packs/day: 0.25    Years: 12.00    Pack years: 3.00    Types: Cigarettes    Quit date: 04/04/2017    Years since quitting: 3.2  . Smokeless tobacco: Never Used  Vaping Use  . Vaping Use: Never used  Substance Use Topics  . Alcohol use: No  . Drug use: No    Home Medications Prior to Admission medications   Medication Sig Start Date End Date Taking? Authorizing Provider  aspirin 81 MG chewable tablet Chew 1 tablet (81 mg total) by mouth daily. 09/18/17   Cheryln Manly, NP  atorvastatin (LIPITOR) 40 MG tablet Take 1 tablet (40 mg total) by mouth every other day. 02/03/20 05/03/20  Cleaver,  Jossie Ng, NP  baclofen (LIORESAL) 20 MG tablet Take by mouth at bedtime. 03/30/20   [provider]  Cyanocobalamin (VITAMIN B-12 IJ) Inject as directed every 30 (thirty) days.     [provider]  DULoxetine (CYMBALTA) 20 MG capsule TAKE (1) CAPSULE BY MOUTH ONCE DAILY. *TAKE WITH 60MG CAP* Patient taking differently: Take 20 mg by mouth daily. TAKE (1) CAPSULE BY MOUTH ONCE DAILY. *TAKE WITH 60MG CAP* 04/27/20   Ursula Alert, MD  DULoxetine (CYMBALTA) 60 MG capsule TAKE (1) CAPSULE BY MOUTH ONCE DAILY. 04/27/20   Ursula Alert, MD  famotidine (PEPCID) 40 MG tablet Take 40 mg by mouth at bedtime. 10/08/18   [provider]  fluticasone (FLONASE) 50 MCG/ACT nasal spray Place 1 spray into both nostrils daily. As needed.    [provider]  folic acid (FOLVITE) 1 MG tablet Take 1 mg by mouth 2 (two) times a day.    [provider]  ibuprofen (ADVIL,MOTRIN) 800 MG tablet 800 mg every 6 (six) hours as needed.  04/16/18   [provider]  lactobacillus acidophilus  (BACID) TABS tablet Take 1 tablet by mouth daily.    [provider]  levonorgestrel (MIRENA) 20 MCG/24HR IUD by Intrauterine route.    [provider]  loperamide (IMODIUM) 2 MG capsule Take 1 capsule (2 mg total) by mouth 3 (three) times daily as needed for diarrhea or loose stools. 06/07/20   Hongalgi, Lenis Dickinson, MD  metoprolol tartrate (LOPRESSOR) 25 MG tablet Take 1 tablet (25 mg total) by mouth 2 (two) times daily. 01/16/18 02/03/20  Arnoldo Lenis, MD  nitroGLYCERIN (NITROLINGUAL) 0.4 MG/SPRAY spray Place 2 sprays under the tongue every 5 (five) minutes x 3 doses as needed. 04/03/20   [provider]  nystatin (MYCOSTATIN/NYSTOP) powder Apply topically.    [provider]  ondansetron (ZOFRAN-ODT) 8 MG disintegrating tablet Take 1 tablet by mouth 3 (three) times daily as needed for nausea.  08/11/17   [provider]  oxyCODONE (OXY IR/ROXICODONE) 5 MG immediate release tablet Take 1 tablet (5 mg total) by mouth every 6 (six) hours as needed for severe pain. Must last 30 days. 05/15/20 06/14/20  Milinda Pointer, MD  pantoprazole (PROTONIX) 40 MG tablet TAKE 1 TABLET BY MOUTH ONCE DAILY BEFORE BREAKFAST. 10/27/17   Arnoldo Lenis, MD  pregabalin (LYRICA) 150 MG capsule Take 150 mg by mouth in the morning and at bedtime. 01/13/20   [provider]  promethazine (PHENERGAN) 25 MG tablet Take 25 mg by mouth 3 (three) times daily as needed for nausea.  08/11/17   [provider]  rizatriptan (MAXALT) 10 MG tablet Take 10 mg by mouth as needed. 12/31/19   [provider]  torsemide (DEMADEX) 20 MG tablet 20 mg once. As needed. 10/17/17   [provider]  Vitamin D, Ergocalciferol, (DRISDOL) 1.25 MG (50000 UT) CAPS capsule Take 50,000 Units by mouth every 7 (seven) days. Wednesday 09/14/18   [provider]    Allergies    Bee venom, Erythromycin, Cephalexin, Neomycin-bacitracin-polymyxin   [bacitracin-neomycin-polymyxin], Neosporin original [bacitracin-neomycin-polymyxin], Prednisone, Sulfa antibiotics, Tobrex [tobramycin], Tramadol, and Penicillins  Review of Systems   Review of Systems  Constitutional: Negative for appetite change, chills and fever.  Respiratory: Negative for shortness of breath.   Cardiovascular: Negative for chest pain and leg swelling.  Gastrointestinal: Positive for abdominal pain, diarrhea, nausea and vomiting. Negative for blood in stool.  Genitourinary: Negative for decreased urine volume, difficulty urinating and  dysuria.  Musculoskeletal: Negative for back pain.  Skin: Negative for color change and rash.  Neurological: Negative for dizziness, weakness and numbness.  Hematological: Negative for adenopathy.  Psychiatric/Behavioral: Negative for confusion.    Physical Exam Updated Vital Signs BP 137/82   Pulse 86   Temp 98 F (36.7 C) (Oral)   Resp 16   Ht 5' (1.524 m)   Wt 95.3 kg   SpO2 98%   BMI 41.01 kg/m   Physical Exam Constitutional:      General: She is not in acute distress.    Appearance: Normal appearance. She is well-developed. She is not ill-appearing.  HENT:     Head: Normocephalic.     Mouth/Throat:     Mouth: Mucous membranes are dry.  Eyes:     Conjunctiva/sclera: Conjunctivae normal.  Neck:     Thyroid: No thyromegaly.     Meningeal: Kernig's sign absent.  Cardiovascular:     Rate and Rhythm: Normal rate and regular rhythm.     Pulses: Normal pulses.  Pulmonary:     Effort: Pulmonary effort is normal.     Breath sounds: Normal breath sounds. No wheezing.  Abdominal:     Palpations: Abdomen is soft.     Tenderness: There is abdominal tenderness. There is no guarding or rebound.     Comments: Diffuse tenderness of the abdomen to palpation.  No guarding or rebound tenderness.  Abdomen is soft.  No CVA tenderness  Musculoskeletal:        General: Normal range of motion.     Cervical back: Normal range of  motion and neck supple.     Right lower leg: No edema.     Left lower leg: No edema.  Skin:    General: Skin is warm.     Capillary Refill: Capillary refill takes less than 2 seconds.     Findings: No rash.  Neurological:     General: No focal deficit present.     Mental Status: She is alert.     Sensory: No sensory deficit.     Motor: No weakness.     ED Results / Procedures / Treatments   Labs (all labs ordered are listed, but only abnormal results are displayed) Labs Reviewed  COMPREHENSIVE METABOLIC PANEL - Abnormal; Notable for the following components:      Result Value   Potassium 2.9 (*)    Calcium 7.7 (*)    Total Protein 4.7 (*)    Albumin 2.2 (*)    AST 209 (*)    ALT 118 (*)    Alkaline Phosphatase 527 (*)    All other components within normal limits  CBC - Abnormal; Notable for the following components:   RBC 5.20 (*)    MCV 79.0 (*)    RDW 16.4 (*)    All other components within normal limits  LIPASE, BLOOD  MAGNESIUM  URINALYSIS, ROUTINE W REFLEX MICROSCOPIC  POC URINE PREG, ED  I-STAT CHEM 8, ED    EKG None  Radiology CT ABDOMEN PELVIS W CONTRAST  Result Date: 06/25/2020 CLINICAL DATA:  Nausea, vomiting, diarrhea approximately 8 weeks. EXAM: CT ABDOMEN AND PELVIS WITH CONTRAST TECHNIQUE: Multidetector CT imaging of the abdomen and pelvis was performed using the standard protocol following bolus administration of intravenous contrast. CONTRAST:  121m OMNIPAQUE IOHEXOL 300 MG/ML  SOLN COMPARISON:  09/25/2017 FINDINGS: Lower Chest: No acute findings. Hepatobiliary: No hepatic masses identified. Severe diffuse hepatic steatosis again noted. Prior cholecystectomy. No evidence of  biliary obstruction. Pancreas:  No mass or inflammatory changes. Spleen: Within normal limits in size and appearance. Adrenals/Urinary Tract: Stable small bilateral adrenal masses, consistent with benign adenomas. No renal masses identified. No evidence of ureteral calculi or  hydronephrosis. Stomach/Bowel: No evidence of obstruction, inflammatory process or abnormal fluid collections. Normal appendix visualized. Vascular/Lymphatic: No pathologically enlarged lymph nodes. No acute vascular findings. Reproductive: IUD seen in appropriate position. No mass or other significant abnormality. Other:  None. Musculoskeletal:  No suspicious bone lesions identified. IMPRESSION: No acute findings within the abdomen or pelvis. Stable severe hepatic steatosis. Stable benign bilateral adrenal adenomas. Electronically Signed   By: Marlaine Hind M.D.   On: 06/25/2020 15:02    Procedures Procedures   Medications Ordered in ED Medications  sodium chloride 0.9 % bolus 1,000 mL (0 mLs Intravenous Stopped 06/25/20 1510)  ondansetron (ZOFRAN) injection 4 mg (4 mg Intravenous Given 06/25/20 1350)  iohexol (OMNIPAQUE) 300 MG/ML solution 100 mL (100 mLs Intravenous Contrast Given 06/25/20 1442)  potassium chloride 10 mEq in 100 mL IVPB (10 mEq Intravenous New Bag/Given 06/25/20 1628)  potassium chloride SA (KLOR-CON) CR tablet 40 mEq (40 mEq Oral Given 06/25/20 1528)    ED Course  I have reviewed the triage vital signs and the nursing notes.  Pertinent labs & imaging results that were available during my care of the patient were reviewed by me and considered in my medical decision making (see chart for details).    MDM Rules/Calculators/A&P                          Patient here with persistent nausea vomiting diarrhea.  Symptoms have been present for 2 months.  She was admitted here early April for chest pain she was also evaluated for her GI symptoms, had a C. difficile test and GI panel, both negative.  On exam, patient nontoxic-appearing.  Vital signs reassuring.  No active vomiting or diarrhea on arrival.  Diffuse tenderness of the lower abdomen without peritoneal signs Labs today no leukocytosis.  Hypokalemia with potassium of 2.9, 3.2 two weeks ago.  No AKI.  Elevated transaminases,  similar to baseline.  Magnesium level within normal limits CT scan today of the abdomen and pelvis without acute findings.  Will give oral and IV potassium here.  Source of patient's persistent vomiting diarrhea unclear, doubt acute process.  No further vomiting during ER stay and she has tolerated oral fluids.  Repeat potassium shows potassium up to 3.4  She is ambulatory in the department, I doubt emergent process.  I feel that she is appropriate for discharge home.  I will prescribe oral potassium and she is agreeable to PCP follow-up to have her potassium level checked in 1 week.  We will also provide follow-up information for GI.  The patient appears reasonably screened and/or stabilized for discharge and I doubt any other medical condition or other [facility] requiring further screening, evaluation, or treatment in the ED at this time prior to discharge.  Final Clinical Impression(s) / ED Diagnoses Final diagnoses:  Nausea vomiting and diarrhea  Hypokalemia    Rx / DC Orders ED Discharge Orders    None       Kem Parkinson, PA-C 06/26/20 2237    Fredia Sorrow, MD 06/29/20 1513

## 2020-06-25 NOTE — Discharge Instructions (Addendum)
Your potassium today was low.  You will need to take potassium as directed for 7 days.  You will then need your potassium levels rechecked.  Your primary care provider can do this for you.  I have also given you follow-up information for a local gastroenterologist.

## 2020-06-25 NOTE — ED Triage Notes (Signed)
N/v/d ~8 weeks, states increased throat pain since yesterday. Dry heaving only today, 3 x diarrhea today. Takes imodium ~8AM today. Generalized "sore" stomach with intermit cramping.

## 2020-06-26 ENCOUNTER — Encounter (HOSPITAL_COMMUNITY)
Admission: RE | Admit: 2020-06-26 | Discharge: 2020-06-26 | Disposition: A | Discharge status: Home / Self Care | Payer: Self-pay | Source: Ambulatory Visit | Attending: Cardiology | Admitting: Cardiology

## 2020-06-26 ENCOUNTER — Ambulatory Visit (HOSPITAL_COMMUNITY)
Admission: RE | Admit: 2020-06-26 | Discharge: 2020-06-26 | Disposition: A | Discharge status: Home / Self Care | Payer: Self-pay | Source: Ambulatory Visit | Attending: Cardiology | Admitting: Cardiology

## 2020-06-26 DIAGNOSIS — R079 Chest pain, unspecified: Secondary | ICD-10-CM

## 2020-06-26 MED ORDER — TECHNETIUM TC 99M TETROFOSMIN IV KIT
30.0000 | PACK | Freq: Once | INTRAVENOUS | Status: AC | PRN
  Administered 2020-06-26: 31.33 via INTRAVENOUS

## 2020-06-26 MED ORDER — REGADENOSON 0.4 MG/5ML IV SOLN
INTRAVENOUS | Status: AC
  Administered 2020-06-26: 0.4 mg via INTRAVENOUS
  Filled 2020-06-26: qty 5

## 2020-06-26 MED ORDER — SODIUM CHLORIDE FLUSH 0.9 % IV SOLN
INTRAVENOUS | Status: AC
  Administered 2020-06-26: 10 mL via INTRAVENOUS
  Filled 2020-06-26: qty 10

## 2020-06-27 ENCOUNTER — Encounter: Payer: Self-pay | Admitting: Student

## 2020-06-27 ENCOUNTER — Encounter (HOSPITAL_COMMUNITY)
Admission: RE | Admit: 2020-06-27 | Discharge: 2020-06-27 | Disposition: A | Discharge status: Home / Self Care | Payer: Self-pay | Source: Ambulatory Visit | Attending: Cardiology | Admitting: Cardiology

## 2020-06-27 ENCOUNTER — Encounter (HOSPITAL_COMMUNITY): Payer: Self-pay

## 2020-06-27 ENCOUNTER — Ambulatory Visit (INDEPENDENT_AMBULATORY_CARE_PROVIDER_SITE_OTHER): Payer: Self-pay | Admitting: Student

## 2020-06-27 ENCOUNTER — Other Ambulatory Visit: Payer: Self-pay

## 2020-06-27 VITALS — BP 120/76 | HR 88 | Ht 60.0 in | Wt 238.0 lb

## 2020-06-27 DIAGNOSIS — Z79899 Other long term (current) drug therapy: Secondary | ICD-10-CM

## 2020-06-27 DIAGNOSIS — I25118 Atherosclerotic heart disease of native coronary artery with other forms of angina pectoris: Secondary | ICD-10-CM

## 2020-06-27 DIAGNOSIS — R7989 Other specified abnormal findings of blood chemistry: Secondary | ICD-10-CM

## 2020-06-27 DIAGNOSIS — R600 Localized edema: Secondary | ICD-10-CM

## 2020-06-27 DIAGNOSIS — E785 Hyperlipidemia, unspecified: Secondary | ICD-10-CM

## 2020-06-27 DIAGNOSIS — R112 Nausea with vomiting, unspecified: Secondary | ICD-10-CM

## 2020-06-27 LAB — NM MYOCAR MULTI W/SPECT W/WALL MOTION / EF
LV dias vol: 54 mL (ref 46–106)
LV sys vol: 19 mL
Peak HR: 115 {beats}/min
RATE: 0.33
Rest HR: 93 {beats}/min
SDS: 4
SRS: 0
SSS: 4
TID: 0.98

## 2020-06-27 MED ORDER — TECHNETIUM TC 99M TETROFOSMIN IV KIT
30.0000 | PACK | Freq: Once | INTRAVENOUS | Status: AC | PRN
  Administered 2020-06-27: 31 via INTRAVENOUS

## 2020-06-27 NOTE — Patient Instructions (Signed)
Medication Instructions:  Your physician recommends that you continue on your current medications as directed. Please refer to the Current Medication list given to you today.  Hold Atorvastatin for now  *If you need a refill on your cardiac medications before your next appointment, please call your pharmacy*   Lab Work: Your physician recommends that you return for lab work in: 1 Week   If you have labs (blood work) drawn today and your tests are completely normal, you will receive your results only by: Marland Kitchen MyChart Message (if you have MyChart) OR . A paper copy in the mail If you have any lab test that is abnormal or we need to change your treatment, we will call you to review the results.   Testing/Procedures: NONE    Follow-Up: At [facility], you and your health needs are our priority.  As part of our continuing mission to provide you with exceptional heart care, we have created designated Provider Care Teams.  These Care Teams include your primary Cardiologist (physician) and Advanced Practice Providers (APPs -  Physician Assistants and Nurse Practitioners) who all work together to provide you with the care you need, when you need it.  We recommend signing up for the patient portal called "MyChart".  Sign up information is provided on this After Visit Summary.  MyChart is used to connect with patients for Virtual Visits (Telemedicine).  Patients are able to view lab/test results, encounter notes, upcoming appointments, etc.  Non-urgent messages can be sent to your provider as well.   To learn more about what you can do with MyChart, go to [email or web address].    Your next appointment:   6 month(s)  The format for your next appointment:   In Person  Provider:   Carlyle Dolly, MD   Other Instructions Thank you for choosing [facility]!

## 2020-06-27 NOTE — Progress Notes (Signed)
Cardiology Office Note    Date:  06/27/2020   ID:  [name], DOB 01-22-1966, MRN [id number]  PCP:  Vidal Schwalbe, MD  Cardiologist: Carlyle Dolly, MD    Chief Complaint  Patient presents with  . Hospitalization Follow-up    History of Present Illness:    Sharon Lawrence is a 55 y.o. female with past medical history of CAD (s/p STEMI in 09/2017 with DES to RCA), HLD, celiac disease,chronic anemia, and prior tobacco usewho presents to the office today for hospital follow-up.   She most recently presented to [facility] ED on 06/06/2020 for evaluation of worsening chest pain, nausea and vomiting for the past 2 weeks. She reported her chest pain had started after having frequent dry heaves and could last for seconds to minutes and occur at rest or with activity. Hs Troponin values were negative and her EKG showed no acute ST changes. An outpatient 2-day Lexiscan Myoview was recommended for further evaluation.   She did present back to the ED on 06/25/2020 for progressive dry heaves and diarrhea. K+ was low at 2.9 and LFT's were elevated with AST 209 and ALT 118. Alk Phos 527. CT Abdomen showed no acute findings in the abdomen or pelvis but did have severe hepatic steatosis. Her K+ was replaced and it was recommended she follow-up with GI as an outpatient.   She did have her Coldiron over the past two days and this resulted today showing no evidence of prior infarct or current ischemia, overall being a low-risk study.   In talking with the patient today, she reports still having frequent nausea and vomiting along with associated diarrhea over the past 4 to 5 weeks. She has only been able to keep down small sips of water and has been consuming popsicles as well. Given her minimal PO intake, she did stop her Torsemide last week and has not taken her Lipitor within the past week as well. She feels like her chest discomfort might be secondary to her frequent dry heaving and vomiting but  reports she had similar symptoms in 2019 prior to her heart attack, therefore this is what prompted her to seek most recent evaluation. Denies any specific exertional chest pain. No recent orthopnea or PND. She has experienced worsening lower extremity edema since being off her diuretic.   Past Medical History:  Diagnosis Date  . ADHD (attention deficit hyperactivity disorder)   . Anxiety   . B12 deficiency 02/07/2011  . Back pain   . CAD (coronary artery disease)    a. s/p recent STEMI on 09/15/2017 with DES to RCA  . Celiac disease   . Folic acid deficiency 26/10/3417  . Headache   . Iron deficiency anemia 02/07/2011  . Myocardial infarction ([place]) 09/15/2017    Past Surgical History:  Procedure Laterality Date  . CHOLECYSTECTOMY    . COLONOSCOPY WITH PROPOFOL N/A 09/26/2017   Procedure: COLONOSCOPY WITH PROPOFOL;  Surgeon: Rogene Houston, MD;  Location: [facility];  Service: Endoscopy;  Laterality: N/A;  . CORONARY STENT INTERVENTION Right 09/15/2017   Procedure: CORONARY STENT INTERVENTION;  Surgeon: Jettie Booze, MD;  Location: [facility];  Service: Cardiovascular;  Laterality: Right;  RCA  . KNEE CARTILAGE SURGERY Right   . LEFT HEART CATH AND CORONARY ANGIOGRAPHY N/A 09/15/2017   Procedure: LEFT HEART CATH AND CORONARY ANGIOGRAPHY;  Surgeon: Jettie Booze, MD;  Location: [facility];  Service: Cardiovascular;  Laterality: N/A;  .  TONSILLECTOMY      Current Medications: Outpatient Medications Prior to Visit  Medication Sig Dispense Refill  . aspirin 81 MG chewable tablet Chew 1 tablet (81 mg total) by mouth daily.    Marland Kitchen atorvastatin (LIPITOR) 40 MG tablet Take 1 tablet (40 mg total) by mouth every other day. (Patient taking differently: Take 40 mg by mouth every other day. Hold) 45 tablet 3  . baclofen (LIORESAL) 20 MG tablet Take by mouth at bedtime.    . Cyanocobalamin (VITAMIN B-12 IJ) Inject as directed every 30 (thirty) days.     . DULoxetine  (CYMBALTA) 20 MG capsule TAKE (1) CAPSULE BY MOUTH ONCE DAILY. *TAKE WITH 60MG CAP* (Patient taking differently: Take 20 mg by mouth daily. TAKE (1) CAPSULE BY MOUTH ONCE DAILY. *TAKE WITH 60MG CAP*) 90 capsule 0  . DULoxetine (CYMBALTA) 60 MG capsule TAKE (1) CAPSULE BY MOUTH ONCE DAILY. 90 capsule 0  . famotidine (PEPCID) 40 MG tablet Take 40 mg by mouth at bedtime.    . fluticasone (FLONASE) 50 MCG/ACT nasal spray Place 1 spray into both nostrils daily. As needed.    . folic acid (FOLVITE) 1 MG tablet Take 1 mg by mouth 2 (two) times a day.    . ibuprofen (ADVIL,MOTRIN) 800 MG tablet 800 mg every 6 (six) hours as needed.     . lactobacillus acidophilus (BACID) TABS tablet Take 1 tablet by mouth daily.    Marland Kitchen levonorgestrel (MIRENA) 20 MCG/24HR IUD by Intrauterine route.    . loperamide (IMODIUM) 2 MG capsule Take 1 capsule (2 mg total) by mouth 3 (three) times daily as needed for diarrhea or loose stools. 15 capsule 0  . metoprolol tartrate (LOPRESSOR) 25 MG tablet Take 1 tablet (25 mg total) by mouth 2 (two) times daily. 180 tablet 3  . nitroGLYCERIN (NITROLINGUAL) 0.4 MG/SPRAY spray Place 2 sprays under the tongue every 5 (five) minutes x 3 doses as needed.    . nystatin (MYCOSTATIN/NYSTOP) powder Apply topically.    . ondansetron (ZOFRAN-ODT) 8 MG disintegrating tablet Take 1 tablet by mouth 3 (three) times daily as needed for nausea.   3  . oxyCODONE (OXY IR/ROXICODONE) 5 MG immediate release tablet Take 1 tablet (5 mg total) by mouth every 6 (six) hours as needed for severe pain. Must last 30 days. 120 tablet 0  . pantoprazole (PROTONIX) 40 MG tablet TAKE 1 TABLET BY MOUTH ONCE DAILY BEFORE BREAKFAST. 30 tablet 6  . potassium chloride SA (KLOR-CON) 20 MEQ tablet Take 1 tablet (20 mEq total) by mouth 2 (two) times daily. 14 tablet 0  . pregabalin (LYRICA) 150 MG capsule Take 150 mg by mouth in the morning and at bedtime.    . promethazine (PHENERGAN) 25 MG tablet Take 25 mg by mouth 3 (three)  times daily as needed for nausea.   2  . rizatriptan (MAXALT) 10 MG tablet Take 10 mg by mouth as needed.    . torsemide (DEMADEX) 20 MG tablet 20 mg once. As needed.  2  . Vitamin D, Ergocalciferol, (DRISDOL) 1.25 MG (50000 UT) CAPS capsule Take 50,000 Units by mouth every 7 (seven) days. Wednesday     No facility-administered medications prior to visit.     Allergies:   Bee venom, Erythromycin, Cephalexin, Neomycin-bacitracin-polymyxin  [bacitracin-neomycin-polymyxin], Neosporin original [bacitracin-neomycin-polymyxin], Prednisone, Sulfa antibiotics, Tobrex [tobramycin], Tramadol, and Penicillins   Social History   Socioeconomic History  . Marital status: Widowed    Spouse name: Not on file  . Number of  children: 3  . Years of education: Not on file  . Highest education level: Associate degree: occupational, Hotel manager, or vocational program  Occupational History  . Not on file  Tobacco Use  . Smoking status: Former Smoker    Packs/day: 0.25    Years: 12.00    Pack years: 3.00    Types: Cigarettes    Quit date: 04/04/2017    Years since quitting: 3.2  . Smokeless tobacco: Never Used  Vaping Use  . Vaping Use: Never used  Substance and Sexual Activity  . Alcohol use: No  . Drug use: No  . Sexual activity: Not Currently  Other Topics Concern  . Not on file  Social History Narrative  . Not on file   Social Determinants of Health   Financial Resource Strain: Not on file  Food Insecurity: Not on file  Transportation Needs: Not on file  Physical Activity: Not on file  Stress: Not on file  Social Connections: Not on file     Family History:  The patient's family history includes Anxiety disorder in her sister; Bipolar disorder in her son; CAD in her maternal grandfather and paternal grandmother; Cancer in her mother and sister; Cirrhosis in her paternal grandfather; Depression in her sister; Diabetes in her paternal grandmother; Drug abuse in her son; Hypertension in her  father and mother; Hypothyroidism in her father and paternal grandmother.   Review of Systems:    Please see the history of present illness.     All other systems reviewed and are otherwise negative except as noted above.   Physical Exam:    VS:  BP 120/76   Pulse 88   Ht 5' (1.524 m)   Wt 238 lb (108 kg)   SpO2 98%   BMI 46.48 kg/m    General: Well developed, well nourished,female appearing in no acute distress. Head: Normocephalic, atraumatic. Neck: No carotid bruits. JVD not elevated.  Lungs: Respirations regular and unlabored, without wheezes or rales.  Heart: Regular rate and rhythm. No S3 or S4.  No murmur, no rubs, or gallops appreciated. Abdomen: Appears non-distended. No obvious abdominal masses. Msk:  Strength and tone appear normal for age. No obvious joint deformities or effusions. Extremities: No clubbing or cyanosis. 1+ pitting edema.  Distal pedal pulses are 2+ bilaterally. Neuro: Alert and oriented X 3. Moves all extremities spontaneously. No focal deficits noted. Psych:  Responds to questions appropriately with a normal affect. Skin: No rashes or lesions noted  Wt Readings from Last 3 Encounters:  06/27/20 238 lb (108 kg)  06/25/20 210 lb (95.3 kg)  06/06/20 238 lb 8.6 oz (108.2 kg)     Studies/Labs Reviewed:   EKG:  EKG is not ordered today.     Recent Labs: 06/25/2020: ALT 118; BUN 9; Creatinine, Ser 0.70; Hemoglobin 15.0; Magnesium 1.7; Platelets 372; Potassium 3.4; Sodium 140   Lipid Panel    Component Value Date/Time   CHOL 77 09/16/2017 0358   TRIG 64 09/16/2017 0358   HDL 19 (L) 09/16/2017 0358   CHOLHDL 4.1 09/16/2017 0358   VLDL 13 09/16/2017 0358   LDLCALC 45 09/16/2017 0358    Additional studies/ records that were reviewed today include:   Cardiac Catheterization: 09/2017  Prox RCA lesion is 80% stenosed. It appeared to be a ruptured plaque.  A drug-eluting stent was successfully placed using a STENT SYNERGY DES 4X24.  Post  intervention, there is a 0% residual stenosis.  The left ventricular systolic function is normal.  LV end diastolic pressure is low.  The left ventricular ejection fraction is 55-65% by visual estimate.  Severe spasm in the right radial artery, requiring heavy sedation and IA NTG. 4Fr diagnostic and 5 Fr Guide catheter used.    Recommend uninterrupted dual antiplatelet therapy with Aspirin 35m daily and Ticagrelor 919mtwice daily for a minimum of 12 months (ACS - Class I recommendation).   Start high dose statin. Check A1C.  Continue smoking cessation.    Echocardiogram: 01/2020 IMPRESSIONS    1. Left ventricular ejection fraction, by estimation, is 65 to 70%. The  left ventricle has normal function. The left ventricle has no regional  wall motion abnormalities. There is mild left ventricular hypertrophy.  Left ventricular diastolic parameters  were normal.  2. Right ventricular systolic function is normal. The right ventricular  size is normal.  3. The mitral valve is normal in structure. No evidence of mitral valve  regurgitation. No evidence of mitral stenosis.  4. The aortic valve is tricuspid. Aortic valve regurgitation is not  visualized. No aortic stenosis is present.  5. The inferior vena cava is normal in size with greater than 50%  respiratory variability, suggesting right atrial pressure of 3 mmHg.   NST: 06/2020  There was no ST segment deviation noted during stress.  The study is normal. There are no perfusion defects consistent with prior infarct or current ischemia  This is a low risk study.  The left ventricular ejection fraction is hyperdynamic (>65%).    Assessment:    1. Coronary artery disease involving native coronary artery of native heart with other form of angina pectoris ([place]  2. Bilateral lower extremity edema   3. Hyperlipidemia LDL goal <70   4. Nausea and vomiting, intractability of vomiting not specified, unspecified vomiting  type   5. Elevated LFTs   6. Medication management      Plan:   In order of problems listed above:  1. CAD - She is s/p STEMI in 09/2017 with DES to RCA and no residual disease along her LAD or LCx. She ruled out for ACS during her recent admission and 2-day LeDimmithich just resulted today shows no evidence of current ischemia and was overall a low risk study. - Continue ASA 81 mg daily and Lopressor 25 mg twice daily.  I have asked her to hold her Atorvastatin for now given her significant LFT elevation by recent labs.  2. Bilateral Lower Extremity Edema - She has a history of chronic edema but symptoms have acutely worsened given that she is holding her diuretic. I recommended that she continue to hold Torsemide for now given her minimal PO intake. She was hypokalemic with K+ at 2.9 by recent labs and was started on K-dur. Will plan for her to have this rechecked in 1 week.   3. HLD - Followed by her PCP and she has been on Atorvastatin 40 mg every other day.  Have recommended holding this for now as outlined above given her elevated LFT's.  4. Nausea and Vomiting/Elevated LFT's - Recent labs showed LFT's were elevated with AST 209 and ALT 118 and Alk Phos 527. She has a history of chronically elevated LFT's but these were significantly higher than prior values. CT Abdomen showed no acute findings in the abdomen or pelvis but did have severe hepatic steatosis. She is also s/p cholecystectomy. GI Panel during her recent admission was also negative.  - She did reach out to GI and was informed  she cannot see Dr. Laural Golden until 10/2020. I will send him a message today to see if he has further recommendations in the interim and to see if she can perhaps get in with another provider sooner. Will hold statin therapy for now and recheck CMET in 1 week. Remains on PRN Zofran.     Medication Adjustments/Labs and Tests Ordered: Current medicines are reviewed at length with the patient  today.  Concerns regarding medicines are outlined above.  Medication changes, Labs and Tests ordered today are listed in the Patient Instructions below. Patient Instructions  Medication Instructions:  Your physician recommends that you continue on your current medications as directed. Please refer to the Current Medication list given to you today.  Hold Atorvastatin for now  *If you need a refill on your cardiac medications before your next appointment, please call your pharmacy*   Lab Work: Your physician recommends that you return for lab work in: 1 Week   If you have labs (blood work) drawn today and your tests are completely normal, you will receive your results only by: Marland Kitchen MyChart Message (if you have MyChart) OR . A paper copy in the mail If you have any lab test that is abnormal or we need to change your treatment, we will call you to review the results.   Testing/Procedures: NONE    Follow-Up: At [facility], you and your health needs are our priority.  As part of our continuing mission to provide you with exceptional heart care, we have created designated Provider Care Teams.  These Care Teams include your primary Cardiologist (physician) and Advanced Practice Providers (APPs -  Physician Assistants and Nurse Practitioners) who all work together to provide you with the care you need, when you need it.  We recommend signing up for the patient portal called "MyChart".  Sign up information is provided on this After Visit Summary.  MyChart is used to connect with patients for Virtual Visits (Telemedicine).  Patients are able to view lab/test results, encounter notes, upcoming appointments, etc.  Non-urgent messages can be sent to your provider as well.   To learn more about what you can do with MyChart, go to [email or web address].    Your next appointment:   6 month(s)  The format for your next appointment:   In Person  Provider:   Carlyle Dolly, MD   Other  Instructions Thank you for choosing [facility]!       Signed, Erma Heritage, PA-C  06/27/2020 5:04 PM    [facility] [address] [place], [place] [address] Phone: [phone] Fax: [phone]

## 2020-07-09 ENCOUNTER — Encounter (HOSPITAL_COMMUNITY): Payer: Self-pay | Admitting: Emergency Medicine

## 2020-07-09 ENCOUNTER — Other Ambulatory Visit: Payer: Self-pay

## 2020-07-09 ENCOUNTER — Emergency Department (HOSPITAL_COMMUNITY): Payer: Self-pay

## 2020-07-09 ENCOUNTER — Emergency Department (HOSPITAL_COMMUNITY)
Admission: EM | Admit: 2020-07-09 | Discharge: 2020-07-10 | Disposition: A | Discharge status: Home / Self Care | Payer: Self-pay | Attending: Emergency Medicine | Admitting: Emergency Medicine

## 2020-07-09 DIAGNOSIS — R112 Nausea with vomiting, unspecified: Secondary | ICD-10-CM | POA: Insufficient documentation

## 2020-07-09 DIAGNOSIS — Z85038 Personal history of other malignant neoplasm of large intestine: Secondary | ICD-10-CM | POA: Insufficient documentation

## 2020-07-09 DIAGNOSIS — Z79899 Other long term (current) drug therapy: Secondary | ICD-10-CM | POA: Insufficient documentation

## 2020-07-09 DIAGNOSIS — R0789 Other chest pain: Secondary | ICD-10-CM | POA: Insufficient documentation

## 2020-07-09 DIAGNOSIS — E876 Hypokalemia: Secondary | ICD-10-CM | POA: Insufficient documentation

## 2020-07-09 DIAGNOSIS — J4540 Moderate persistent asthma, uncomplicated: Secondary | ICD-10-CM | POA: Insufficient documentation

## 2020-07-09 DIAGNOSIS — Z87891 Personal history of nicotine dependence: Secondary | ICD-10-CM | POA: Insufficient documentation

## 2020-07-09 DIAGNOSIS — R197 Diarrhea, unspecified: Secondary | ICD-10-CM | POA: Insufficient documentation

## 2020-07-09 DIAGNOSIS — R109 Unspecified abdominal pain: Secondary | ICD-10-CM | POA: Insufficient documentation

## 2020-07-09 DIAGNOSIS — Z7982 Long term (current) use of aspirin: Secondary | ICD-10-CM | POA: Insufficient documentation

## 2020-07-09 DIAGNOSIS — R110 Nausea: Secondary | ICD-10-CM

## 2020-07-09 DIAGNOSIS — I2510 Atherosclerotic heart disease of native coronary artery without angina pectoris: Secondary | ICD-10-CM | POA: Insufficient documentation

## 2020-07-09 DIAGNOSIS — I10 Essential (primary) hypertension: Secondary | ICD-10-CM | POA: Insufficient documentation

## 2020-07-09 LAB — CBC
HCT: 44.4 % (ref 36.0–46.0)
Hemoglobin: 14.5 g/dL (ref 12.0–15.0)
MCH: 25.2 pg — ABNORMAL LOW (ref 26.0–34.0)
MCHC: 32.7 g/dL (ref 30.0–36.0)
MCV: 77.1 fL — ABNORMAL LOW (ref 80.0–100.0)
Platelets: 538 10*3/uL — ABNORMAL HIGH (ref 150–400)
RBC: 5.76 MIL/uL — ABNORMAL HIGH (ref 3.87–5.11)
RDW: 19.2 % — ABNORMAL HIGH (ref 11.5–15.5)
WBC: 11 10*3/uL — ABNORMAL HIGH (ref 4.0–10.5)
nRBC: 0 % (ref 0.0–0.2)

## 2020-07-09 LAB — COMPREHENSIVE METABOLIC PANEL
ALT: 82 U/L — ABNORMAL HIGH (ref 0–44)
AST: 116 U/L — ABNORMAL HIGH (ref 15–41)
Albumin: 2.2 g/dL — ABNORMAL LOW (ref 3.5–5.0)
Alkaline Phosphatase: 457 U/L — ABNORMAL HIGH (ref 38–126)
Anion gap: 9 (ref 5–15)
BUN: 6 mg/dL (ref 6–20)
CO2: 20 mmol/L — ABNORMAL LOW (ref 22–32)
Calcium: 7.9 mg/dL — ABNORMAL LOW (ref 8.9–10.3)
Chloride: 108 mmol/L (ref 98–111)
Creatinine, Ser: 0.92 mg/dL (ref 0.44–1.00)
GFR, Estimated: >60 mL/min (ref >60)
Glucose, Bld: 103 mg/dL — ABNORMAL HIGH (ref 70–99)
Potassium: 2.8 mmol/L — ABNORMAL LOW (ref 3.5–5.1)
Sodium: 137 mmol/L (ref 135–145)
Total Bilirubin: 1.1 mg/dL (ref 0.3–1.2)
Total Protein: 5 g/dL — ABNORMAL LOW (ref 6.5–8.1)

## 2020-07-09 LAB — I-STAT BETA HCG BLOOD, ED (MC, WL, AP ONLY): I-stat hCG, quantitative: <5 m[IU]/mL (ref <5)

## 2020-07-09 LAB — TROPONIN I (HIGH SENSITIVITY)
Troponin I (High Sensitivity): 6 ng/L (ref <18)
Troponin I (High Sensitivity): 7 ng/L (ref <18)

## 2020-07-09 LAB — MAGNESIUM: Magnesium: 1.9 mg/dL (ref 1.7–2.4)

## 2020-07-09 LAB — LIPASE, BLOOD: Lipase: 19 U/L (ref 11–51)

## 2020-07-09 MED ORDER — POTASSIUM CHLORIDE 10 MEQ/100ML IV SOLN
10.0000 meq | INTRAVENOUS | Status: AC
  Administered 2020-07-09 (×4): 10 meq via INTRAVENOUS
  Filled 2020-07-09 (×4): qty 100

## 2020-07-09 MED ORDER — PANTOPRAZOLE SODIUM 40 MG IV SOLR
40.0000 mg | Freq: Once | INTRAVENOUS | Status: AC
  Administered 2020-07-09: 40 mg via INTRAVENOUS
  Filled 2020-07-09: qty 40

## 2020-07-09 MED ORDER — LORAZEPAM 2 MG/ML IJ SOLN
1.0000 mg | Freq: Once | INTRAMUSCULAR | Status: AC
  Administered 2020-07-09: 1 mg via INTRAVENOUS
  Filled 2020-07-09: qty 1

## 2020-07-09 MED ORDER — LACTATED RINGERS IV BOLUS
1000.0000 mL | Freq: Once | INTRAVENOUS | Status: AC
  Administered 2020-07-09: 1000 mL via INTRAVENOUS

## 2020-07-09 MED ORDER — PROMETHAZINE HCL 25 MG RE SUPP: 25.0000 mg | Freq: Four times a day (QID) | RECTAL | 0 refills | Status: DC | PRN

## 2020-07-09 MED ORDER — SUCRALFATE 1 G PO TABS
1.0000 g | ORAL_TABLET | Freq: Once | ORAL | Status: AC
  Administered 2020-07-09: 1 g via ORAL
  Filled 2020-07-09: qty 1

## 2020-07-09 MED ORDER — DROPERIDOL 2.5 MG/ML IJ SOLN
1.2500 mg | Freq: Once | INTRAMUSCULAR | Status: AC
  Administered 2020-07-09: 1.25 mg via INTRAVENOUS
  Filled 2020-07-09: qty 2

## 2020-07-09 NOTE — ED Notes (Signed)
Transported to xray 

## 2020-07-09 NOTE — ED Provider Notes (Signed)
[facility] EMERGENCY DEPARTMENT Provider Note   CSN: [id number] Arrival date & time: 07/09/20  1646     History Chief Complaint  Patient presents with  . Chest Pain    Sharon Lawrence is a 55 y.o. female.  The history is provided by the patient and medical records.  Chest Pain  Sharon Lawrence is a 55 y.o. female who presents to the Emergency Department complaining of chest pain. She presents the emergency department complaining of chest discomfort that started earlier today with exertional dyspnea and a fluttering sensation in her chest. She has been experiencing nausea, vomiting and diarrhea since February 27. She has not experienced any emesis for the last three days but she still has dry heaves. She is experiencing numerous episodes of diarrhea daily, greater than 10 yellow watery bowel movements in the last 24 hours. She has mild abdominal discomfort. She has chronic lower extremity edema and states that this is at her baseline. She was admitted to the hospital a few weeks ago and had a negative stress test and that time. She does have a history of coronary artery disease status post stent placement. She has been taking Zofran at home with no significant improvement in symptoms. She is not seen G.I. at for this issue.    Past Medical History:  Diagnosis Date  . ADHD (attention deficit hyperactivity disorder)   . Anxiety   . B12 deficiency 02/07/2011  . Back pain   . CAD (coronary artery disease)    a. s/p recent STEMI on 09/15/2017 with DES to RCA  . Celiac disease   . Folic acid deficiency 93/11/298  . Headache   . Iron deficiency anemia 02/07/2011  . Myocardial infarction ([place]) 09/15/2017    Patient Active Problem List   Diagnosis Date Noted  . Chronic use of opiate for therapeutic purpose 06/13/2020  . Uncomplicated opioid dependence ([place]) 06/13/2020  . Chest pain 06/06/2020  . Benign neoplasm of colon 02/14/2020  . Epigastric pain 02/14/2020  . Internal  derangement of right knee 02/14/2020  . Moderate persistent asthma with (acute) exacerbation 02/14/2020  . Other vitamin B12 deficiency anemias 02/14/2020  . Adjustment disorder 02/14/2020  . Panic attack 02/14/2020  . Spondylosis without myelopathy or radiculopathy, lumbosacral region 12/09/2019  . Chronic low back pain (Bilateral) w/o sciatica 12/09/2019  . Lumbar facet synovial cyst (L5-S1) (Left) 12/09/2019  . Generalized anxiety disorder 10/06/2018  . MDD (major depressive disorder), recurrent episode, mild ([place]) 10/06/2018  . Thrombocytosis 09/30/2018  . Microcytic anemia 09/23/2018  . Polyarthralgia 09/09/2018  . Abnormal MRI, lumbar spine (07/16/2016) 12/29/2017  . DDD (degenerative disc disease), lumbar 12/29/2017  . DDD (degenerative disc disease), thoracic 12/29/2017  . Lumbar facet arthropathy (Bilateral) 12/29/2017  . Lumbar facet syndrome (Bilateral) 12/29/2017  . Long term current use of anticoagulant therapy (Plavix) 12/29/2017  . Neurogenic pain 12/29/2017  . Chronic musculoskeletal pain 12/29/2017  . Chronic low back pain (Primary Area of Pain) (Bilateral) (R>L) w/ sciatica (Right) 12/03/2017  . Chronic lower extremity pain (Secondary Area of Pain) (Right) 12/03/2017  . Chronic sacroiliac joint pain (Bilateral) (L>R) 12/03/2017  . Chronic pain syndrome 12/03/2017  . Long term current use of opiate analgesic 12/03/2017  . Pharmacologic therapy 12/03/2017  . Disorder of skeletal system 12/03/2017  . Problems influencing health status 12/03/2017  . Hypothyroid 11/06/2017  . History of ST elevation myocardial infarction (STEMI) 11/06/2017  . GI bleed 11/06/2017  . Gastro-esophageal reflux disease without esophagitis 11/06/2017  .  Bleeding per rectum 11/06/2017  . Bilateral lower extremity edema 11/06/2017  . Benign essential hypertension 11/06/2017  . Back muscle spasm 11/06/2017  . Panic disorder 11/06/2017  . Anemia 11/06/2017  . Attention deficit hyperactivity  disorder, combined type 11/06/2017  . Syncope and collapse 11/06/2017  . Sessile colonic polyp 11/06/2017  . Right-sided low back pain with sciatica 11/06/2017  . RAD (reactive airway disease), moderate persistent, uncomplicated 27/78/2423  . Other chronic pain 11/06/2017  . Migraine 11/06/2017  . Major depression, single episode 11/06/2017  . Psychophysiologic insomnia 11/06/2017  . Iron deficiency anemia 11/06/2017  . Hematochezia 09/25/2017  . Heart attack ([place]) 09/25/2017  . Hypotension 09/25/2017  . Morbidly obese ([place]) 09/25/2017  . Elevated LFTs 09/25/2017  . Chronic diarrhea, with Celiac disease 09/25/2017  . Former smoker   . Hyperlipidemia 09/17/2017  . Acute ST elevation myocardial infarction (STEMI) of inferior wall ([place]) 09/15/2017  . Family hx of colon cancer 09/11/2017  . Non-intractable vomiting with nausea 09/11/2017  . Celiac disease 09/11/2017  . Influenza vaccination declined 01/01/2017  . Vaginal condyloma 06/22/2013  . Obesity 06/22/2013  . Fatty liver disease, nonalcoholic 53/61/4431  . Folic acid deficiency 54/00/8676  . Iron deficiency 02/07/2011  . B12 deficiency 02/07/2011    Past Surgical History:  Procedure Laterality Date  . CHOLECYSTECTOMY    . COLONOSCOPY WITH PROPOFOL N/A 09/26/2017   Procedure: COLONOSCOPY WITH PROPOFOL;  Surgeon: Rogene Houston, MD;  [facility];  Service: Endoscopy;  Laterality: N/A;  . CORONARY STENT INTERVENTION Right 09/15/2017   Procedure: CORONARY STENT INTERVENTION;  Surgeon: Jettie Booze, MD;  Location: [facility];  Service: Cardiovascular;  Laterality: Right;  RCA  . KNEE CARTILAGE SURGERY Right   . LEFT HEART CATH AND CORONARY ANGIOGRAPHY N/A 09/15/2017   Procedure: LEFT HEART CATH AND CORONARY ANGIOGRAPHY;  Surgeon: Jettie Booze, MD;  Location: [facility];  Service: Cardiovascular;  Laterality: N/A;  . TONSILLECTOMY       OB History    Gravida  3   Para  3   Term       Preterm  3   AB      Living  3     SAB      IAB      Ectopic      Multiple      Live Births  3           Family History  Problem Relation Age of Onset  . Cancer Mother   . Hypertension Mother   . Hypertension Father   . Hypothyroidism Father   . Cancer Sister   . Anxiety disorder Sister   . Depression Sister   . CAD Maternal Grandfather   . Cirrhosis Paternal Grandfather   . CAD Paternal Grandmother   . Diabetes Paternal Grandmother   . Hypothyroidism Paternal Grandmother   . Drug abuse Son   . Bipolar disorder Son     Social History   Tobacco Use  . Smoking status: Former Smoker    Packs/day: 0.25    Years: 12.00    Pack years: 3.00    Types: Cigarettes    Quit date: 04/04/2017    Years since quitting: 3.2  . Smokeless tobacco: Never Used  Vaping Use  . Vaping Use: Never used  Substance Use Topics  . Alcohol use: No  . Drug use: No    Home Medications Prior to Admission medications   Medication Sig  Start Date End Date Taking? Authorizing Provider  promethazine (PHENERGAN) 25 MG suppository Place 1 suppository (25 mg total) rectally every 6 (six) hours as needed for nausea or vomiting. 07/09/20  Yes Quintella Reichert, MD  aspirin 81 MG chewable tablet Chew 1 tablet (81 mg total) by mouth daily. 09/18/17   Cheryln Manly, NP  atorvastatin (LIPITOR) 40 MG tablet Take 1 tablet (40 mg total) by mouth every other day. Patient taking differently: Take 40 mg by mouth every other day. Hold 02/03/20 05/03/20  Deberah Pelton, NP  baclofen (LIORESAL) 20 MG tablet Take by mouth at bedtime. 03/30/20   [provider]  Cyanocobalamin (VITAMIN B-12 IJ) Inject as directed every 30 (thirty) days.     [provider]  DULoxetine (CYMBALTA) 20 MG capsule TAKE (1) CAPSULE BY MOUTH ONCE DAILY. *TAKE WITH 60MG CAP* Patient taking differently: Take 20 mg by mouth daily. TAKE (1) CAPSULE BY MOUTH ONCE DAILY. *TAKE WITH 60MG CAP* 04/27/20   Ursula Alert, MD  DULoxetine (CYMBALTA) 60 MG capsule TAKE (1) CAPSULE BY MOUTH ONCE DAILY. 04/27/20   Ursula Alert, MD  famotidine (PEPCID) 40 MG tablet Take 40 mg by mouth at bedtime. 10/08/18   [provider]  fluticasone (FLONASE) 50 MCG/ACT nasal spray Place 1 spray into both nostrils daily. As needed.    [provider]  folic acid (FOLVITE) 1 MG tablet Take 1 mg by mouth 2 (two) times a day.    [provider]  ibuprofen (ADVIL,MOTRIN) 800 MG tablet 800 mg every 6 (six) hours as needed.  04/16/18   [provider]  lactobacillus acidophilus (BACID) TABS tablet Take 1 tablet by mouth daily.    [provider]  levonorgestrel (MIRENA) 20 MCG/24HR IUD by Intrauterine route.    [provider]  loperamide (IMODIUM) 2 MG capsule Take 1 capsule (2 mg total) by mouth 3 (three) times daily as needed for diarrhea or loose stools. 06/07/20   Hongalgi, Lenis Dickinson, MD  metoprolol tartrate (LOPRESSOR) 25 MG tablet Take 1 tablet (25 mg total) by mouth 2 (two) times daily. 01/16/18 02/03/20  Arnoldo Lenis, MD  nitroGLYCERIN (NITROLINGUAL) 0.4 MG/SPRAY spray Place 2 sprays under the tongue every 5 (five) minutes x 3 doses as needed. 04/03/20   [provider]  nystatin (MYCOSTATIN/NYSTOP) powder Apply topically.    [provider]  ondansetron (ZOFRAN-ODT) 8 MG disintegrating tablet Take 1 tablet by mouth 3 (three) times daily as needed for nausea.  08/11/17   [provider]  oxyCODONE (OXY IR/ROXICODONE) 5 MG immediate release tablet Take 1 tablet (5 mg total) by mouth every 6 (six) hours as needed for severe pain. Must last 30 days. 05/15/20 06/14/20  Milinda Pointer, MD  pantoprazole (PROTONIX) 40 MG tablet TAKE 1 TABLET BY MOUTH ONCE DAILY BEFORE BREAKFAST. 10/27/17   Arnoldo Lenis, MD  potassium chloride SA (KLOR-CON) 20 MEQ tablet Take 1 tablet (20 mEq total) by mouth 2 (two) times daily. 06/25/20   Triplett, Tammy, PA-C   pregabalin (LYRICA) 150 MG capsule Take 150 mg by mouth in the morning and at bedtime. 01/13/20   [provider]  promethazine (PHENERGAN) 25 MG tablet Take 25 mg by mouth 3 (three) times daily as needed for nausea.  08/11/17   [provider]  rizatriptan (MAXALT) 10 MG tablet Take 10 mg by mouth as needed. 12/31/19   [provider]  torsemide (DEMADEX) 20 MG tablet 20 mg once. As needed. 10/17/17  [provider]  Vitamin D, Ergocalciferol, (DRISDOL) 1.25 MG (50000 UT) CAPS capsule Take 50,000 Units by mouth every 7 (seven) days. Wednesday 09/14/18   [provider]    Allergies    Bee venom, Erythromycin, Cephalexin, Neomycin-bacitracin-polymyxin  [bacitracin-neomycin-polymyxin], Neosporin original [bacitracin-neomycin-polymyxin], Prednisone, Sulfa antibiotics, Tobrex [tobramycin], Tramadol, and Penicillins  Review of Systems   Review of Systems  Cardiovascular: Positive for chest pain.  All other systems reviewed and are negative.   Physical Exam Updated Vital Signs BP 127/80   Pulse 80   Temp 97.7 F (36.5 C)   Resp 16   Ht 5' (1.524 m)   Wt 107 kg   SpO2 100%   BMI 46.07 kg/m   Physical Exam Vitals and nursing note reviewed.  Constitutional:      Appearance: She is well-developed.  HENT:     Head: Normocephalic and atraumatic.  Cardiovascular:     Rate and Rhythm: Normal rate and regular rhythm.     Heart sounds: No murmur heard.   Pulmonary:     Effort: Pulmonary effort is normal. No respiratory distress.     Breath sounds: Normal breath sounds.  Abdominal:     Palpations: Abdomen is soft.     Tenderness: There is no abdominal tenderness. There is no guarding or rebound.  Musculoskeletal:        General: No tenderness.  Skin:    General: Skin is warm and dry.  Neurological:     Mental Status: She is alert and oriented to person, place, and time.  Psychiatric:        Behavior: Behavior normal.     ED Results  / Procedures / Treatments   Labs (all labs ordered are listed, but only abnormal results are displayed) Labs Reviewed  CBC - Abnormal; Notable for the following components:      Result Value   WBC 11.0 (*)    RBC 5.76 (*)    MCV 77.1 (*)    MCH 25.2 (*)    RDW 19.2 (*)    Platelets 538 (*)    All other components within normal limits  COMPREHENSIVE METABOLIC PANEL - Abnormal; Notable for the following components:   Potassium 2.8 (*)    CO2 20 (*)    Glucose, Bld 103 (*)    Calcium 7.9 (*)    Total Protein 5.0 (*)    Albumin 2.2 (*)    AST 116 (*)    ALT 82 (*)    Alkaline Phosphatase 457 (*)    All other components within normal limits  MAGNESIUM  LIPASE, BLOOD  I-STAT BETA HCG BLOOD, ED ([facility], [facility], [facility] ONLY)  TROPONIN I (HIGH SENSITIVITY)  TROPONIN I (HIGH SENSITIVITY)    EKG EKG Interpretation  Date/Time:  Sunday Jul 09 2020 16:57:38 EDT Ventricular Rate:  100 PR Interval:  124 QRS Duration: 96 QT Interval:  382 QTC Calculation: 492 R Axis:   83 Text Interpretation: Sinus rhythm with Premature supraventricular complexes Cannot rule out Inferior infarct , age undetermined Cannot rule out Anterior infarct , age undetermined T wave abnormality, consider lateral ischemia Abnormal ECG Confirmed by Quintella Reichert [phone]) on 07/09/2020 5:15:24 PM   Radiology DG Chest 2 View  Result Date: 07/09/2020 CLINICAL DATA:  Nausea, vomiting and diarrhea. EXAM: CHEST - 2 VIEW COMPARISON:  June 06, 2020 FINDINGS: The heart size and mediastinal contours are within normal limits. Both lungs are clear. Radiopaque surgical clips are seen within the right upper quadrant. The visualized  skeletal structures are unremarkable. IMPRESSION: No active cardiopulmonary disease. Electronically Signed   By: Virgina Norfolk M.D.   On: 07/09/2020 18:29    Procedures Procedures   Medications Ordered in ED Medications  potassium chloride 10 mEq in 100 mL IVPB (0 mEq Intravenous Stopped 07/09/20 2334)   lactated ringers bolus 1,000 mL (0 mLs Intravenous Stopped 07/09/20 2021)  droperidol (INAPSINE) 2.5 MG/ML injection 1.25 mg (1.25 mg Intravenous Given 07/09/20 1832)  pantoprazole (PROTONIX) injection 40 mg (40 mg Intravenous Given 07/09/20 2027)  sucralfate (CARAFATE) tablet 1 g (1 g Oral Given 07/09/20 2026)  LORazepam (ATIVAN) injection 1 mg (1 mg Intravenous Given 07/09/20 2026)    ED Course  I have reviewed the triage vital signs and the nursing notes.  Pertinent labs & imaging results that were available during my care of the patient were reviewed by me and considered in my medical decision making (see chart for details).    MDM Rules/Calculators/A&P                         patient with history of coronary artery disease here for evaluation of nausea, vomiting, diarrhea and chest discomfort today. EKG is without acute ischemic changes in troponin is negative times two. Labs significant for hypokalemia. She does have mild elevation in her transaminases but this is improved when compared to priors. Abdominal examination is benign. Presentation is not consistent with ACS, acute abdomen, dissection, PE. She was treated with IV fluid hydration, potassium repletion. On repeat assessment she appears improved. Plan to discharge home with close G.I. follow-up - scheduled for later this week as well as return precautions.  Final Clinical Impression(s) / ED Diagnoses Final diagnoses:  Atypical chest pain  Nausea  Diarrhea, unspecified type  Hypokalemia    Rx / DC Orders ED Discharge Orders         Ordered    promethazine (PHENERGAN) 25 MG suppository  Every 6 hours PRN        07/09/20 2326           Quintella Reichert, MD 07/09/20 2336

## 2020-07-09 NOTE — ED Triage Notes (Signed)
Pt reports nausea, vomiting, and diarrhea x 10 weeks with ED visit x 2 and 1 admission.  Reports pain to center of chest since yesterday with exertional SOB.

## 2020-07-11 ENCOUNTER — Ambulatory Visit (INDEPENDENT_AMBULATORY_CARE_PROVIDER_SITE_OTHER): Payer: Self-pay | Admitting: Internal Medicine

## 2020-07-11 ENCOUNTER — Encounter (INDEPENDENT_AMBULATORY_CARE_PROVIDER_SITE_OTHER): Payer: Self-pay | Admitting: Internal Medicine

## 2020-07-11 ENCOUNTER — Other Ambulatory Visit: Payer: Self-pay

## 2020-07-11 ENCOUNTER — Telehealth: Payer: Self-pay | Admitting: Pain Medicine

## 2020-07-11 ENCOUNTER — Encounter (HOSPITAL_COMMUNITY): Payer: Self-pay | Admitting: *Deleted

## 2020-07-11 ENCOUNTER — Inpatient Hospital Stay (HOSPITAL_COMMUNITY)
Admission: EM | Admit: 2020-07-11 | Discharge: 2020-07-14 | DRG: 392 | Disposition: A | Discharge status: Home / Self Care | Payer: Self-pay | Source: Ambulatory Visit | Attending: Internal Medicine | Admitting: Internal Medicine

## 2020-07-11 VITALS — BP 119/84 | HR 84 | Temp 98.5°F | Ht 60.0 in | Wt 227.1 lb

## 2020-07-11 DIAGNOSIS — Z975 Presence of (intrauterine) contraceptive device: Secondary | ICD-10-CM

## 2020-07-11 DIAGNOSIS — I10 Essential (primary) hypertension: Secondary | ICD-10-CM | POA: Diagnosis present

## 2020-07-11 DIAGNOSIS — Z9111 Patient's noncompliance with dietary regimen: Secondary | ICD-10-CM

## 2020-07-11 DIAGNOSIS — I2510 Atherosclerotic heart disease of native coronary artery without angina pectoris: Secondary | ICD-10-CM | POA: Diagnosis present

## 2020-07-11 DIAGNOSIS — Z882 Allergy status to sulfonamides status: Secondary | ICD-10-CM

## 2020-07-11 DIAGNOSIS — R634 Abnormal weight loss: Secondary | ICD-10-CM | POA: Diagnosis present

## 2020-07-11 DIAGNOSIS — Z885 Allergy status to narcotic agent status: Secondary | ICD-10-CM

## 2020-07-11 DIAGNOSIS — Z20822 Contact with and (suspected) exposure to covid-19: Secondary | ICD-10-CM | POA: Diagnosis present

## 2020-07-11 DIAGNOSIS — K900 Celiac disease: Secondary | ICD-10-CM

## 2020-07-11 DIAGNOSIS — E039 Hypothyroidism, unspecified: Secondary | ICD-10-CM | POA: Diagnosis present

## 2020-07-11 DIAGNOSIS — Z833 Family history of diabetes mellitus: Secondary | ICD-10-CM

## 2020-07-11 DIAGNOSIS — Z888 Allergy status to other drugs, medicaments and biological substances status: Secondary | ICD-10-CM

## 2020-07-11 DIAGNOSIS — R945 Abnormal results of liver function studies: Secondary | ICD-10-CM

## 2020-07-11 DIAGNOSIS — K529 Noninfective gastroenteritis and colitis, unspecified: Secondary | ICD-10-CM | POA: Diagnosis present

## 2020-07-11 DIAGNOSIS — R112 Nausea with vomiting, unspecified: Secondary | ICD-10-CM

## 2020-07-11 DIAGNOSIS — L97919 Non-pressure chronic ulcer of unspecified part of right lower leg with unspecified severity: Secondary | ICD-10-CM | POA: Insufficient documentation

## 2020-07-11 DIAGNOSIS — F419 Anxiety disorder, unspecified: Secondary | ICD-10-CM | POA: Diagnosis present

## 2020-07-11 DIAGNOSIS — Z86010 Personal history of colonic polyps: Secondary | ICD-10-CM

## 2020-07-11 DIAGNOSIS — R7989 Other specified abnormal findings of blood chemistry: Secondary | ICD-10-CM | POA: Diagnosis present

## 2020-07-11 DIAGNOSIS — Z79899 Other long term (current) drug therapy: Secondary | ICD-10-CM

## 2020-07-11 DIAGNOSIS — R197 Diarrhea, unspecified: Secondary | ICD-10-CM

## 2020-07-11 DIAGNOSIS — K2970 Gastritis, unspecified, without bleeding: Secondary | ICD-10-CM | POA: Diagnosis present

## 2020-07-11 DIAGNOSIS — K760 Fatty (change of) liver, not elsewhere classified: Secondary | ICD-10-CM | POA: Diagnosis present

## 2020-07-11 DIAGNOSIS — M1990 Unspecified osteoarthritis, unspecified site: Secondary | ICD-10-CM | POA: Diagnosis present

## 2020-07-11 DIAGNOSIS — Z818 Family history of other mental and behavioral disorders: Secondary | ICD-10-CM

## 2020-07-11 DIAGNOSIS — E8809 Other disorders of plasma-protein metabolism, not elsewhere classified: Secondary | ICD-10-CM | POA: Diagnosis present

## 2020-07-11 DIAGNOSIS — Z880 Allergy status to penicillin: Secondary | ICD-10-CM

## 2020-07-11 DIAGNOSIS — I87331 Chronic venous hypertension (idiopathic) with ulcer and inflammation of right lower extremity: Secondary | ICD-10-CM | POA: Insufficient documentation

## 2020-07-11 DIAGNOSIS — Z7982 Long term (current) use of aspirin: Secondary | ICD-10-CM

## 2020-07-11 DIAGNOSIS — E538 Deficiency of other specified B group vitamins: Secondary | ICD-10-CM | POA: Diagnosis present

## 2020-07-11 DIAGNOSIS — I252 Old myocardial infarction: Secondary | ICD-10-CM

## 2020-07-11 DIAGNOSIS — R519 Headache, unspecified: Secondary | ICD-10-CM | POA: Diagnosis present

## 2020-07-11 DIAGNOSIS — E876 Hypokalemia: Secondary | ICD-10-CM | POA: Diagnosis present

## 2020-07-11 DIAGNOSIS — Z87891 Personal history of nicotine dependence: Secondary | ICD-10-CM

## 2020-07-11 DIAGNOSIS — Z6841 Body Mass Index (BMI) 40.0 and over, adult: Secondary | ICD-10-CM

## 2020-07-11 DIAGNOSIS — K219 Gastro-esophageal reflux disease without esophagitis: Secondary | ICD-10-CM | POA: Diagnosis present

## 2020-07-11 DIAGNOSIS — F909 Attention-deficit hyperactivity disorder, unspecified type: Secondary | ICD-10-CM | POA: Diagnosis present

## 2020-07-11 DIAGNOSIS — Z8249 Family history of ischemic heart disease and other diseases of the circulatory system: Secondary | ICD-10-CM

## 2020-07-11 DIAGNOSIS — R7401 Elevation of levels of liver transaminase levels: Secondary | ICD-10-CM

## 2020-07-11 LAB — COMPREHENSIVE METABOLIC PANEL
ALT: 85 U/L — ABNORMAL HIGH (ref 0–44)
AST: 122 U/L — ABNORMAL HIGH (ref 15–41)
Albumin: 2.6 g/dL — ABNORMAL LOW (ref 3.5–5.0)
Alkaline Phosphatase: 509 U/L — ABNORMAL HIGH (ref 38–126)
Anion gap: 9 (ref 5–15)
BUN: 8 mg/dL (ref 6–20)
CO2: 24 mmol/L (ref 22–32)
Calcium: 7.8 mg/dL — ABNORMAL LOW (ref 8.9–10.3)
Chloride: 104 mmol/L (ref 98–111)
Creatinine, Ser: 0.78 mg/dL (ref 0.44–1.00)
GFR, Estimated: >60 mL/min (ref >60)
Glucose, Bld: 89 mg/dL (ref 70–99)
Potassium: 2.2 mmol/L — CL (ref 3.5–5.1)
Sodium: 137 mmol/L (ref 135–145)
Total Bilirubin: 1.1 mg/dL (ref 0.3–1.2)
Total Protein: 5.4 g/dL — ABNORMAL LOW (ref 6.5–8.1)

## 2020-07-11 LAB — CBC WITH DIFFERENTIAL/PLATELET
Abs Immature Granulocytes: 0.03 10*3/uL (ref 0.00–0.07)
Basophils Absolute: 0.1 10*3/uL (ref 0.0–0.1)
Basophils Relative: 1 %
Eosinophils Absolute: 0.2 10*3/uL (ref 0.0–0.5)
Eosinophils Relative: 2 %
HCT: 40.7 % (ref 36.0–46.0)
Hemoglobin: 13.8 g/dL (ref 12.0–15.0)
Immature Granulocytes: 0 %
Lymphocytes Relative: 41 %
Lymphs Abs: 4 10*3/uL (ref 0.7–4.0)
MCH: 26.1 pg (ref 26.0–34.0)
MCHC: 33.9 g/dL (ref 30.0–36.0)
MCV: 76.9 fL — ABNORMAL LOW (ref 80.0–100.0)
Monocytes Absolute: 0.6 10*3/uL (ref 0.1–1.0)
Monocytes Relative: 7 %
Neutro Abs: 4.7 10*3/uL (ref 1.7–7.7)
Neutrophils Relative %: 49 %
Platelets: 529 10*3/uL — ABNORMAL HIGH (ref 150–400)
RBC: 5.29 MIL/uL — ABNORMAL HIGH (ref 3.87–5.11)
RDW: 19.4 % — ABNORMAL HIGH (ref 11.5–15.5)
WBC: 9.6 10*3/uL (ref 4.0–10.5)
nRBC: 0 % (ref 0.0–0.2)

## 2020-07-11 LAB — RESP PANEL BY RT-PCR (FLU A&B, COVID) ARPGX2
Influenza A by PCR: NEGATIVE
Influenza B by PCR: NEGATIVE
SARS Coronavirus 2 by RT PCR: NEGATIVE

## 2020-07-11 LAB — PROTIME-INR
INR: 1 (ref 0.8–1.2)
Prothrombin Time: 13.6 seconds (ref 11.4–15.2)

## 2020-07-11 LAB — MAGNESIUM: Magnesium: 1.8 mg/dL (ref 1.7–2.4)

## 2020-07-11 LAB — LACTIC ACID, PLASMA: Lactic Acid, Venous: 1 mmol/L (ref 0.5–1.9)

## 2020-07-11 LAB — LIPASE, BLOOD: Lipase: 22 U/L (ref 11–51)

## 2020-07-11 MED ORDER — HEPARIN SODIUM (PORCINE) 5000 UNIT/ML IJ SOLN
5000.0000 [IU] | Freq: Three times a day (TID) | INTRAMUSCULAR | Status: DC
  Administered 2020-07-11 – 2020-07-12 (×2): 5000 [IU] via SUBCUTANEOUS
  Filled 2020-07-11 (×2): qty 1

## 2020-07-11 MED ORDER — METOCLOPRAMIDE HCL 5 MG/ML IJ SOLN
10.0000 mg | Freq: Four times a day (QID) | INTRAMUSCULAR | Status: DC
  Administered 2020-07-11 – 2020-07-13 (×7): 10 mg via INTRAVENOUS
  Filled 2020-07-11 (×7): qty 2

## 2020-07-11 MED ORDER — ASPIRIN 81 MG PO CHEW
81.0000 mg | CHEWABLE_TABLET | Freq: Every day | ORAL | Status: DC
  Administered 2020-07-12 – 2020-07-14 (×3): 81 mg via ORAL
  Filled 2020-07-11 (×3): qty 1

## 2020-07-11 MED ORDER — DULOXETINE HCL 20 MG PO CPEP
20.0000 mg | ORAL_CAPSULE | Freq: Every day | ORAL | Status: DC
  Filled 2020-07-11 (×2): qty 1

## 2020-07-11 MED ORDER — METOCLOPRAMIDE HCL 5 MG/ML IJ SOLN: 10.0000 mg | Freq: Three times a day (TID) | INTRAMUSCULAR | Status: DC | PRN

## 2020-07-11 MED ORDER — ACETAMINOPHEN 650 MG RE SUPP: 650.0000 mg | Freq: Four times a day (QID) | RECTAL | Status: DC | PRN

## 2020-07-11 MED ORDER — POTASSIUM CHLORIDE 10 MEQ/100ML IV SOLN
10.0000 meq | INTRAVENOUS | Status: AC
  Administered 2020-07-11 – 2020-07-12 (×6): 10 meq via INTRAVENOUS
  Filled 2020-07-11 (×6): qty 100

## 2020-07-11 MED ORDER — SODIUM CHLORIDE 0.9 % IV BOLUS
500.0000 mL | Freq: Once | INTRAVENOUS | Status: AC
  Administered 2020-07-12: 500 mL via INTRAVENOUS

## 2020-07-11 MED ORDER — SUMATRIPTAN SUCCINATE 50 MG PO TABS: 50.0000 mg | ORAL_TABLET | ORAL | Status: DC | PRN

## 2020-07-11 MED ORDER — PANTOPRAZOLE SODIUM 40 MG IV SOLR
40.0000 mg | Freq: Two times a day (BID) | INTRAVENOUS | Status: DC
  Administered 2020-07-11 – 2020-07-13 (×4): 40 mg via INTRAVENOUS
  Filled 2020-07-11 (×6): qty 40

## 2020-07-11 MED ORDER — FAMOTIDINE 20 MG PO TABS
40.0000 mg | ORAL_TABLET | Freq: Every day | ORAL | Status: DC
  Administered 2020-07-12 – 2020-07-13 (×2): 40 mg via ORAL
  Filled 2020-07-11 (×2): qty 2

## 2020-07-11 MED ORDER — PREGABALIN 75 MG PO CAPS
150.0000 mg | ORAL_CAPSULE | Freq: Every day | ORAL | Status: DC
  Administered 2020-07-12 – 2020-07-14 (×3): 150 mg via ORAL
  Filled 2020-07-11 (×3): qty 2

## 2020-07-11 MED ORDER — SODIUM CHLORIDE 0.9 % IV SOLN: INTRAVENOUS | Status: DC

## 2020-07-11 MED ORDER — BACLOFEN 10 MG PO TABS
20.0000 mg | ORAL_TABLET | Freq: Every day | ORAL | Status: DC
  Administered 2020-07-12 – 2020-07-13 (×2): 20 mg via ORAL
  Filled 2020-07-11 (×2): qty 2

## 2020-07-11 MED ORDER — ONDANSETRON HCL 4 MG/2ML IJ SOLN
4.0000 mg | Freq: Four times a day (QID) | INTRAMUSCULAR | Status: DC | PRN
  Administered 2020-07-11: 4 mg via INTRAVENOUS
  Filled 2020-07-11: qty 2

## 2020-07-11 MED ORDER — ACETAMINOPHEN 325 MG PO TABS
650.0000 mg | ORAL_TABLET | Freq: Four times a day (QID) | ORAL | Status: DC | PRN
Start: 2020-07-11 — End: 2020-07-14

## 2020-07-11 MED ORDER — OXYCODONE HCL 5 MG PO TABS
5.0000 mg | ORAL_TABLET | ORAL | Status: DC | PRN
  Administered 2020-07-13: 5 mg via ORAL
  Filled 2020-07-11: qty 1

## 2020-07-11 NOTE — Progress Notes (Signed)
Lab results reviewed. LFTs and Alk phos trending up. RUQ [place] ordered. K+ trending down. Given intractable nausea - no PO K+ at this time. 6 runs of IV K+ ordered. Magnesium is 1.8. Will continue to monitor.

## 2020-07-11 NOTE — ED Notes (Signed)
Called Acute Care to give report spoke with [facility]. Sharon Lawrence informed this nurse that pt will go to room 335 and to call nurse Spencer back in 5 minutes for report.

## 2020-07-11 NOTE — ED Notes (Signed)
Attempted to call report to [facility], no answer at this time. Will call back.

## 2020-07-11 NOTE — Telephone Encounter (Signed)
Patient's sister [name] called to let us know the patient is being admitted to hospital and will not be able to come in for 07-12-20 appt. She will call when she is getting out so we can get her in asap/ she is out of medications.

## 2020-07-11 NOTE — Patient Instructions (Signed)
Patient to be taken to the ER. She will need to be hospitalized. Possible esophagogastroduodenoscopy in a.m.

## 2020-07-11 NOTE — ED Notes (Signed)
Orthostatics completed, with sitting pt became dizzy and lightheaded. With standing pt became dizzy, lightheaded, and clammy. Denied cp or sob. Dr. Josph Macho notified.

## 2020-07-11 NOTE — ED Notes (Signed)
Pt wretching loudly with dry heaves.  Updated pt on plan of care.

## 2020-07-11 NOTE — ED Triage Notes (Signed)
VOMITING FOR MONTHS

## 2020-07-11 NOTE — ED Notes (Signed)
Critical Result: Potassium 2.2 Dr. Clearence Ped notified by [place]. Donalda Ewings, RN.

## 2020-07-11 NOTE — H&P (Signed)
TRH H&P    Patient Demographics:    Sharon Lawrence, is a 55 y.o. female  MRN: [id number]  DOB - 1965/07/26  Admit Date - 07/11/2020  Referring MD/NP/PA: Laural Golden  Outpatient Primary MD for the patient is Vidal Schwalbe, MD  Patient coming from: GI office  Chief complaint- intractable nausea, vomiting, and diarrhea   HPI:    Sharon Lawrence  is a 55 y.o. female, with history of coronary artery disease status post myocardial infarction in 2019, iron deficiency anemia, folic acid deficiency, celiac disease, B12 deficiency, ADHD, and anxiety presents as a direct admission from Dr. Melony Overly with a chief complaint of intractable nausea vomiting diarrhea.  Patient reports that he has been ongoing problem since February.  Since then constant, every day.  She reports now it is to the point where she is dry heaving constantly and even wakes her up at night.  If she does try to eat, she throws up what ever she ate within 20 minutes.  She has had no bloody emesis.  She reports that some of her emesis has appeared as bile.  She reports diarrhea up to 10 times a day.  At first it was loose stools, now it is yellow water.  Diarrhea also wakes her up at night, and she has had incontinent episodes.  She reports no hematochezia or melena.  Patient reports that she has had a 15 pound weight loss over 3 months.  She admits to stomach pain, mostly related to the time immediately before she has emesis.  She reports that the stomach pain feels like an intensified hunger pain that sharp and rumbly and sometimes burning.  The pain is in her left upper quadrant.  She has never had a history of pancreatitis.  Patient does not drink.  She is on famotidine and Protonix, but she cannot tell if it is helping or not.  She reports that her last normal meal was in February.  Most recently she has been trying to just drink fluids and eat popsicles.  She reports that she  takes more than the Cipro small bite at a time even the fluids and popsicles will make her throw up.  She denies any fevers.  She denies any dysuria, patient has had a Mirena in for 7 years, no longer has menstrual cycles, does have hot flashes.  Patient reports associated fatigue and feeling rundown.  She reports generalized weakness and that led to a fall while getting out of the bathtub.  She did not slip but she just can get her leg over the edge of the bathtub.  She did not hit her head or lose consciousness, but did require help to get up.  She reports orthostatic symptoms as well.  Patient complains of worsening lower extremity edema.  She reports that she has had intermittent lower extremity edema since her MI in 2019, but now its more constant.  She has not noticed unilateral edema.  No new medication changes she has had is her statin was decreased to every other day, and then  completely discontinued 1 week ago.  She takes no over-the-counter supplements, herbs, or medicines.  Patient is presented to the ER on several occasions in the last few months.   Her most recent visit to the ER was on May 8 for chest pain.  At that time she also complained of dry heaves, and 10 episodes of diarrhea per day.  She was hypokalemic with potassium of 2.8 that day.  Patient was given a 1 L LR bolus, droperidol, Protonix, Carafate, Ativan, and 10 mEq of IV potassium.  Troponin was negative x2 and EKG was without ischemic changes, but did have a borderline prolonged QTC at 492.  ED note reports that patient improved with fluid and she was sent home with advised to follow-up with GI.  She was seen in clinic today by Dr. Laural Golden.  He requested direct admission to the hospital.  If her potassium is above 3.0 in the a.m. he plans for EGD.  He would request GI pathogen panel and C. difficile testing repeated although she has negative test 1 month ago.  He notes that diarrhea could be due to celiac disease or narcotic  withdrawal.  Patient has been going to pain clinic for low back pain but since her intractable nausea and vomiting she has not been able to keep her pain medications down.  Is also concerned about her albumin being low at 2.2-could be consistent with poor p.o. intake.  He would like to rule out nephrotic syndrome, and cholestasis.  He recommends Reglan 10 mg IV 4 times daily, Protonix 40 mg every 12 hours, mitochondrial antibody test, and plan for EGD.  Upon arrival: Temp 98.1, heart rate 79, respiratory 18, blood pressure 123/90, satting at 99% CBC, CMP, magnesium, lactic acid, lipase, Zofran, Reglan, maintenance fluids ordered at admission We will change p.o. Protonix to IV we will add mitochondrial antibody test  Patient does not smoke, does not drink alcohol, does not use illicit drugs.  She has a POA -her mother.  She is full code but request not to be left on life support for a prolonged period.  She is vaccinated for COVID.    Review of systems:    In addition to the HPI above,  No Fever-chills, No Headache, No changes with Vision or hearing, No problems swallowing food or Liquids, No Chest pain, Cough or Shortness of Breath, admits to peripheral edema Admits to abdominal pain, nausea, vomiting, diarrhea No Blood in stool or Urine, Admits to incontinence of bowels No dysuria, No new skin rashes or bruises, No new joints pains-aches,  No new weakness, tingling, numbness in any extremity, No recent weight gain admits to 15 pound weight loss No polyuria, polydypsia or polyphagia, No significant Mental Stressors.  All other systems reviewed and are negative.    Past History of the following :    Past Medical History:  Diagnosis Date  . ADHD (attention deficit hyperactivity disorder)   . Anxiety   . B12 deficiency 02/07/2011  . Back pain   . CAD (coronary artery disease)    a. s/p recent STEMI on 09/15/2017 with DES to RCA  . Celiac disease   . Folic acid deficiency  02/07/2011  . Headache   . Iron deficiency anemia 02/07/2011  . Myocardial infarction ([place]) 09/15/2017      Past Surgical History:  Procedure Laterality Date  . CHOLECYSTECTOMY    . COLONOSCOPY WITH PROPOFOL N/A 09/26/2017   Procedure: COLONOSCOPY WITH PROPOFOL;  Surgeon: Rogene Houston, MD;  [facility];  Service: Endoscopy;  Laterality: N/A;  . CORONARY STENT INTERVENTION Right 09/15/2017   Procedure: CORONARY STENT INTERVENTION;  Surgeon: Jettie Booze, MD;  Location: [facility];  Service: Cardiovascular;  Laterality: Right;  RCA  . KNEE CARTILAGE SURGERY Right   . LEFT HEART CATH AND CORONARY ANGIOGRAPHY N/A 09/15/2017   Procedure: LEFT HEART CATH AND CORONARY ANGIOGRAPHY;  Surgeon: Jettie Booze, MD;  Location: [facility];  Service: Cardiovascular;  Laterality: N/A;  . TONSILLECTOMY        Social History:      Social History   Tobacco Use  . Smoking status: Former Smoker    Packs/day: 0.25    Years: 12.00    Pack years: 3.00    Types: Cigarettes    Quit date: 04/04/2017    Years since quitting: 3.2  . Smokeless tobacco: Never Used  Substance Use Topics  . Alcohol use: No       Family History :     Family History  Problem Relation Age of Onset  . Cancer Mother   . Hypertension Mother   . Hypertension Father   . Hypothyroidism Father   . Cancer Sister   . Anxiety disorder Sister   . Depression Sister   . CAD Maternal Grandfather   . Cirrhosis Paternal Grandfather   . CAD Paternal Grandmother   . Diabetes Paternal Grandmother   . Hypothyroidism Paternal Grandmother   . Drug abuse Son   . Bipolar disorder Son       Home Medications:   Prior to Admission medications   Medication Sig Start Date End Date Taking? Authorizing Provider  aspirin 81 MG chewable tablet Chew 1 tablet (81 mg total) by mouth daily. 09/18/17   Cheryln Manly, NP  baclofen (LIORESAL) 20 MG tablet Take by mouth at bedtime. 03/30/20    [provider]  Cyanocobalamin (VITAMIN B-12 IJ) Inject as directed every 30 (thirty) days.     [provider]  DULoxetine (CYMBALTA) 20 MG capsule TAKE (1) CAPSULE BY MOUTH ONCE DAILY. *TAKE WITH 60MG CAP* Patient taking differently: Take 20 mg by mouth daily. TAKE (1) CAPSULE BY MOUTH ONCE DAILY. *TAKE WITH 60MG CAP* 04/27/20   Ursula Alert, MD  DULoxetine (CYMBALTA) 60 MG capsule TAKE (1) CAPSULE BY MOUTH ONCE DAILY. 04/27/20   Ursula Alert, MD  famotidine (PEPCID) 40 MG tablet Take 40 mg by mouth at bedtime. 10/08/18   [provider]  fluticasone (FLONASE) 50 MCG/ACT nasal spray Place 1 spray into both nostrils daily. As needed.    [provider]  folic acid (FOLVITE) 1 MG tablet Take 1 mg by mouth 2 (two) times a day.    [provider]  lactobacillus acidophilus (BACID) TABS tablet Take 1 tablet by mouth daily.    [provider]  levonorgestrel (MIRENA) 20 MCG/24HR IUD by Intrauterine route.    [provider]  loperamide (IMODIUM) 2 MG capsule Take 1 capsule (2 mg total) by mouth 3 (three) times daily as needed for diarrhea or loose stools. 06/07/20   Hongalgi, Lenis Dickinson, MD  metoprolol tartrate (LOPRESSOR) 25 MG tablet Take 1 tablet (25 mg total) by mouth 2 (two) times daily. 01/16/18 02/03/20  Arnoldo Lenis, MD  nitroGLYCERIN (NITROLINGUAL) 0.4 MG/SPRAY spray Place 2 sprays under the tongue every 5 (five) minutes x 3 doses as needed. 04/03/20   [provider]  nystatin (MYCOSTATIN/NYSTOP) powder Apply topically.  [provider]  ondansetron (ZOFRAN-ODT) 8 MG disintegrating tablet Take 1 tablet by mouth 3 (three) times daily as needed for nausea.  08/11/17   [provider]  oxyCODONE (OXY IR/ROXICODONE) 5 MG immediate release tablet Take 1 tablet (5 mg total) by mouth every 6 (six) hours as needed for severe pain. Must last 30 days. 05/15/20 06/14/20  Milinda Pointer, MD  pantoprazole  (PROTONIX) 40 MG tablet TAKE 1 TABLET BY MOUTH ONCE DAILY BEFORE BREAKFAST. Patient taking differently: Take 40 mg by mouth daily before breakfast. 10/27/17   Arnoldo Lenis, MD  potassium chloride SA (KLOR-CON) 20 MEQ tablet Take 1 tablet (20 mEq total) by mouth 2 (two) times daily. 06/25/20   Triplett, Tammy, PA-C  pregabalin (LYRICA) 150 MG capsule Take 150 mg by mouth in the morning and at bedtime. 01/13/20   [provider]  promethazine (PHENERGAN) 25 MG suppository Place 1 suppository (25 mg total) rectally every 6 (six) hours as needed for nausea or vomiting. 07/09/20   Quintella Reichert, MD  promethazine (PHENERGAN) 25 MG tablet Take 25 mg by mouth 3 (three) times daily as needed for nausea.  Patient not taking: Reported on 07/11/2020 08/11/17   [provider]  rizatriptan (MAXALT) 10 MG tablet Take 10 mg by mouth as needed. 12/31/19   [provider]  torsemide (DEMADEX) 20 MG tablet 20 mg once. As needed. 10/17/17   [provider]  Vitamin D, Ergocalciferol, (DRISDOL) 1.25 MG (50000 UT) CAPS capsule Take 50,000 Units by mouth every 7 (seven) days. Wednesday 09/14/18   [provider]     Allergies:     Allergies  Allergen Reactions  . Bee Venom Anaphylaxis  . Erythromycin Anaphylaxis  . Cephalexin   . Neomycin-Bacitracin-Polymyxin  [Bacitracin-Neomycin-Polymyxin] Swelling    Sight of application  . Neosporin Original [Bacitracin-Neomycin-Polymyxin] Swelling    Swelling at site of application  . Prednisone   . Sulfa Antibiotics Swelling    Sulfa eye drops caused eyes to swell Sulfa eye drops caused eyes to swell  . Tobrex [Tobramycin] Swelling  . Tramadol Hives  . Penicillins Rash    Has patient had a PCN reaction causing immediate rash, facial/tongue/throat swelling, SOB or lightheadedness with hypotension: Yes Has patient had a PCN reaction causing severe rash involving mucus membranes or skin necrosis: No Has patient had a PCN  reaction that required hospitalization: No Has patient had a PCN reaction occurring within the last 10 years: No If all of the above answers are "NO", then may proceed with Cephalosporin use.      Physical Exam:   Vitals  Blood pressure 123/90, pulse 79, temperature 98.1 F (36.7 C), temperature source Oral, resp. rate 18, height 5' (1.524 m), weight 103 kg, SpO2 99 %.  1.  General: Patient sitting in wheelchair, holding emesis bag, no acute distress  2. Psychiatric: Mood and behavior normal for situation, memory intact, pleasant, cooperative with exam  3. Neurologic: Face is symmetric, speech and language are normal, moves all 4 extremities voluntarily, alert and oriented x3, no acute deficit on limited exam  4. HEENMT:  Head is atraumatic, normocephalic, pupils are reactive to light, neck is supple, trachea is midline, mucous membranes are mildly dry  5. Respiratory : Lungs are clear to auscultation bilaterally without wheezing, rales, rhonchi, no clubbing present  6. Cardiovascular : Heart rate is normal, rhythm is regular, no murmurs rubs or gallops, 2+ pitting edema to the knee  7. Gastrointestinal:  Abdomen is soft,  nondistended, nontender to palpation, bowel sounds normal  8. Skin:  Skin is warm dry and intact without acute lesion on limited exam  9.Musculoskeletal:  No acute deformity, pitting edema as mentioned above, no calf tenderness, peripheral pulses palpated    Data Review:    CBC Recent Labs  Lab 07/09/20 1750  WBC 11.0*  HGB 14.5  HCT 44.4  PLT 538*  MCV 77.1*  MCH 25.2*  MCHC 32.7  RDW 19.2*   ------------------------------------------------------------------------------------------------------------------  Results for orders placed or performed during the hospital encounter of 07/09/20 (from the past 48 hour(s))  CBC     Status: Abnormal   Collection Time: 07/09/20  5:50 PM  Result Value Ref Range   WBC 11.0 (H) 4.0 - 10.5 K/uL   RBC  5.76 (H) 3.87 - 5.11 MIL/uL   Hemoglobin 14.5 12.0 - 15.0 g/dL   HCT 44.4 36.0 - 46.0 %   MCV 77.1 (L) 80.0 - 100.0 fL   MCH 25.2 (L) 26.0 - 34.0 pg   MCHC 32.7 30.0 - 36.0 g/dL   RDW 19.2 (H) 11.5 - 15.5 %   Platelets 538 (H) 150 - 400 K/uL   nRBC 0.0 0.0 - 0.2 %    Comment: Performed at [facility], [address] [place], [place] [address]  Comprehensive metabolic panel     Status: Abnormal   Collection Time: 07/09/20  5:50 PM  Result Value Ref Range   Sodium 137 135 - 145 mmol/L   Potassium 2.8 (L) 3.5 - 5.1 mmol/L   Chloride 108 98 - 111 mmol/L   CO2 20 (L) 22 - 32 mmol/L   Glucose, Bld 103 (H) 70 - 99 mg/dL    Comment: Glucose reference range applies only to samples taken after fasting for at least 8 hours.   BUN 6 6 - 20 mg/dL   Creatinine, Ser 0.92 0.44 - 1.00 mg/dL   Calcium 7.9 (L) 8.9 - 10.3 mg/dL   Total Protein 5.0 (L) 6.5 - 8.1 g/dL   Albumin 2.2 (L) 3.5 - 5.0 g/dL   AST 116 (H) 15 - 41 U/L   ALT 82 (H) 0 - 44 U/L   Alkaline Phosphatase 457 (H) 38 - 126 U/L   Total Bilirubin 1.1 0.3 - 1.2 mg/dL   GFR, Estimated >60 >60 mL/min    Comment: (NOTE) Calculated using the CKD-EPI Creatinine Equation (2021)    Anion gap 9 5 - 15    Comment: Performed at [facility], [place] [address] [place], [place] [address]  Magnesium     Status: None   Collection Time: 07/09/20  5:50 PM  Result Value Ref Range   Magnesium 1.9 1.7 - 2.4 mg/dL    Comment: Performed at [facility], [place] [address], [place], [place] [address]  Lipase, blood     Status: None   Collection Time: 07/09/20  5:50 PM  Result Value Ref Range   Lipase 19 11 - 51 U/L    Comment: Performed at [facility] [address], [place], [place] [address]  Troponin I (High Sensitivity)     Status: None   Collection Time: 07/09/20  5:50 PM  Result Value Ref Range   Troponin I (High Sensitivity) 6 <18 ng/L    Comment: (NOTE) Elevated high sensitivity troponin I (hsTnI) values and  significant  changes across serial measurements may suggest ACS but many other  chronic and acute conditions are known to elevate hsTnI  results.  Refer to the "Links" section for chest pain algorithms and additional  guidance. Performed at [facility], [place] [address] [place], [place] [address]   I-Stat beta hCG blood, ED     Status: None   Collection Time: 07/09/20  6:04 PM  Result Value Ref Range   I-stat hCG, quantitative <5.0 <5 mIU/mL   Comment 3            Comment:   GEST. AGE      CONC.  (mIU/mL)   <=1 WEEK        5 - 50     2 WEEKS       50 - 500     3 WEEKS       100 - 10,000     4 WEEKS     1,000 - 30,000        FEMALE AND NON-PREGNANT FEMALE:     LESS THAN 5 mIU/mL   Troponin I (High Sensitivity)     Status: None   Collection Time: 07/09/20  7:45 PM  Result Value Ref Range   Troponin I (High Sensitivity) 7 <18 ng/L    Comment: (NOTE) Elevated high sensitivity troponin I (hsTnI) values and significant  changes across serial measurements may suggest ACS but many other  chronic and acute conditions are known to elevate hsTnI results.  Refer to the "Links" section for chest pain algorithms and additional  guidance. Performed at [facility], [place] [address] [place],  [address]     Chemistries  Recent Labs  Lab 07/09/20 1750  NA 137  K 2.8*  CL 108  CO2 20*  GLUCOSE 103*  BUN 6  CREATININE 0.92  CALCIUM 7.9*  MG 1.9  AST 116*  ALT 82*  ALKPHOS 457*  BILITOT 1.1   ------------------------------------------------------------------------------------------------------------------  ------------------------------------------------------------------------------------------------------------------ GFR: Estimated Creatinine Clearance: 77.4 mL/min (by C-G formula based on SCr of 0.92 mg/dL). Liver Function Tests: Recent Labs  Lab 07/09/20 1750  AST 116*  ALT 82*  ALKPHOS 457*  BILITOT 1.1  PROT 5.0*  ALBUMIN 2.2*   Recent Labs  Lab  07/09/20 1750  LIPASE 19   No results for input(s): AMMONIA in the last 168 hours. Coagulation Profile: No results for input(s): INR, PROTIME in the last 168 hours. Cardiac Enzymes: No results for input(s): CKTOTAL, CKMB, CKMBINDEX, TROPONINI in the last 168 hours. BNP (last 3 results) No results for input(s): PROBNP in the last 8760 hours. HbA1C: No results for input(s): HGBA1C in the last 72 hours. CBG: No results for input(s): GLUCAP in the last 168 hours. Lipid Profile: No results for input(s): CHOL, HDL, LDLCALC, TRIG, CHOLHDL, LDLDIRECT in the last 72 hours. Thyroid Function Tests: No results for input(s): TSH, T4TOTAL, FREET4, T3FREE, THYROIDAB in the last 72 hours. Anemia Panel: No results for input(s): VITAMINB12, FOLATE, FERRITIN, TIBC, IRON, RETICCTPCT in the last 72 hours.  --------------------------------------------------------------------------------------------------------------- Urine analysis: No results found for: COLORURINE, APPEARANCEUR, LABSPEC, PHURINE, GLUCOSEU, HGBUR, BILIRUBINUR, KETONESUR, PROTEINUR, UROBILINOGEN, NITRITE, LEUKOCYTESUR    Imaging Results:    DG Chest 2 View  Result Date: 07/09/2020 CLINICAL DATA:  Nausea, vomiting and diarrhea. EXAM: CHEST - 2 VIEW COMPARISON:  June 06, 2020 FINDINGS: The heart size and mediastinal contours are within normal limits. Both lungs are clear. Radiopaque surgical clips are seen within the right upper quadrant. The visualized skeletal structures are unremarkable. IMPRESSION: No active cardiopulmonary disease. Electronically Signed   By: Joyce Gross.D.  On: 07/09/2020 18:29    Updated EKG pending   Assessment & Plan:    Active Problems:   Intractable nausea and vomiting   1. Intractable nausea and vomiting 1. History of celiac disease status reports compliance with diet 2. Zofran as needed, Reglan scheduled 3. Consult GI 4. Plan for EGD in the a.m. 5. Clear liquids until midnight 6. Monitor  electrolytes and replace as needed 7. Continue to monitor 2. Hypokalemia 1. 2 days ago potassium 2.8, partially replaced with 10 mEq 2. Secondary to GI losses 3. Patient has not been able to keep down potassium supplement at home 4. Recheck potassium and magnesium today 5. Replace as needed 6. Trend in a.m. 3. Diarrhea 1. Repeat C. difficile and GI pathogen panel as recommended by GI 2. Monitor electrolytes and replace as needed 3. Continue fluids 4. Abnormal LFTs 1. LFTs elevated at last ER visit 2. Statin discontinued within the last week 3. CMP pending 4. Known fatty liver, still LFTs higher than normal for her, could be secondary to acute illness, consider right upper quadrant ultrasound to continue to trend up 5. Protein calorie malnutrition 1. Secondary to poor p.o. intake 2. Concern for nephrotic syndrome 3. CMP today pending 4. UA pending 5. Consider adding protein shake between meals if nausea can be controlled 6.    DVT Prophylaxis-Heparin- SCDs  AM Labs Ordered, also please review Full Orders  Family Communication: No family at bedside Code Status: Full Admission status: Observation    The patient's presenting symptoms include intractable nausea, vomiting, and diarrhea The worrisome physical exam findings include dry heaving during exam The initial radiographic and laboratory data are worrisome because of Severe hepatic steatosis on CT 4/24. The chronic co-morbidities include CAD, Iron deficiency anemia, celiac disease, B12 deficiency, ADHD, anxiety       * I certify that at the point of admission it is my clinical judgment that the patient will require inpatient hospital care spanning beyond 2 midnights from the point of admission due to high intensity of service, high risk for further deterioration and high frequency of surveillance required.*  Time spent in minutes : [place]

## 2020-07-11 NOTE — Progress Notes (Signed)
Presenting complaint;  Nausea vomiting and diarrhea.  History of present illness  Patient is 55 year old Caucasian female with history of celiac disease, B12 deficiency on parenteral replacement therapy as well as coronary artery disease with STEMI in July 2019, obesity and GERD as well as history of colonic polyps noted in July 2019 but not removed because of need for continued dual antiplatelet therapy who has not been feeling well for the last 10 weeks. She began to experience nausea and vomiting on 04/30/2020.  She has not responded to outpatient therapy.  She also experience soreness across upper abdomen which generally occurs before and after she vomits.  Vomitus has consisted of clear fluid bile and food debris.  Recently she has not been able to keep solids or liquids down.  She was briefly hospitalized on 06/06/2020 for chest pain nausea and vomiting.  She was also complaining of diarrhea.  She had C. difficile and GI pathogen panel and these studies were negative.  She also received IV KCl but her serum potassium was 3.3 at the time of discharge. She was seen in the emergency room on 06/25/2020 and serum potassium was 2.9.  She was also noted to have elevated transaminases higher than before.  Abdominal pelvic CT was obtained and revealed fatty liver.  Once again she was treated and discharged. She presented again 2 days ago and serum potassium was 2.8.  Once again she received IV fluids IV KCl and she was discharged.. Patient was seen by Ms. Bernerd Pho, PA of cardiology service on 06/27/2020.  Statin was discontinued because of elevated transaminases.  I was contacted so the patient could be seen earlier than 3 months.  Patient is accompanied by her Sister [name].  Patient feels weak.  She has noted progressive lower extremity edema.  She says edema started when she had MI back in July 2029 but lately it has gotten worse.  She denies hematemesis melena or rectal bleeding.  She is having  anywhere from 6-10 stools per day.  Stools are watery.  She also has leakage when she heaves and retches.  She has not experienced fever or chills.  Her appetite is poor.  She has tried ondansetron but it has not helped. She was given Phenergan suppositories and a visit 2 days ago but she has not had a chance to use this medication. She also complains of soreness across upper abdomen and pain in left upper quadrant which occurs after she heaves and retches.  She is also having daily heartburn in spite of taking the medication. She has chronic low back pain.  She goes to pain clinic.  She has not been able to keep her pain medicines down either.  Patient states she has not taken ibuprofen in several months.  It is listed as one of the medications.  I went and deleted it. Review of the systems is negative for hematuria or dysuria. Patient says she has been very compliant with gluten-free diet. Patient quit cigarette smoking 4 years ago.  She does not drink alcohol.   Current Medications: Outpatient Encounter Medications as of 07/11/2020  Medication Sig  . aspirin 81 MG chewable tablet Chew 1 tablet (81 mg total) by mouth daily.  . baclofen (LIORESAL) 20 MG tablet Take by mouth at bedtime.  . Cyanocobalamin (VITAMIN B-12 IJ) Inject as directed every 30 (thirty) days.   . DULoxetine (CYMBALTA) 20 MG capsule TAKE (1) CAPSULE BY MOUTH ONCE DAILY. *TAKE WITH 60MG CAP* (Patient taking differently: Take 20  mg by mouth daily. TAKE (1) CAPSULE BY MOUTH ONCE DAILY. *TAKE WITH 60MG CAP*)  . DULoxetine (CYMBALTA) 60 MG capsule TAKE (1) CAPSULE BY MOUTH ONCE DAILY.  . famotidine (PEPCID) 40 MG tablet Take 40 mg by mouth at bedtime.  . fluticasone (FLONASE) 50 MCG/ACT nasal spray Place 1 spray into both nostrils daily. As needed.  . folic acid (FOLVITE) 1 MG tablet Take 1 mg by mouth 2 (two) times a day.  . lactobacillus acidophilus (BACID) TABS tablet Take 1 tablet by mouth daily.  Marland Kitchen levonorgestrel (MIRENA) 20  MCG/24HR IUD by Intrauterine route.  . loperamide (IMODIUM) 2 MG capsule Take 1 capsule (2 mg total) by mouth 3 (three) times daily as needed for diarrhea or loose stools.  . nitroGLYCERIN (NITROLINGUAL) 0.4 MG/SPRAY spray Place 2 sprays under the tongue every 5 (five) minutes x 3 doses as needed.  . nystatin (MYCOSTATIN/NYSTOP) powder Apply topically.  . ondansetron (ZOFRAN-ODT) 8 MG disintegrating tablet Take 1 tablet by mouth 3 (three) times daily as needed for nausea.   . potassium chloride SA (KLOR-CON) 20 MEQ tablet Take 1 tablet (20 mEq total) by mouth 2 (two) times daily.  . pregabalin (LYRICA) 150 MG capsule Take 150 mg by mouth in the morning and at bedtime.  . promethazine (PHENERGAN) 25 MG suppository Place 1 suppository (25 mg total) rectally every 6 (six) hours as needed for nausea or vomiting.  . rizatriptan (MAXALT) 10 MG tablet Take 10 mg by mouth as needed.  . torsemide (DEMADEX) 20 MG tablet 20 mg once. As needed.  . Vitamin D, Ergocalciferol, (DRISDOL) 1.25 MG (50000 UT) CAPS capsule Take 50,000 Units by mouth every 7 (seven) days. Wednesday  . [DISCONTINUED] ibuprofen (ADVIL,MOTRIN) 800 MG tablet 800 mg every 6 (six) hours as needed.   Marland Kitchen atorvastatin (LIPITOR) 40 MG tablet Take 1 tablet (40 mg total) by mouth every other day. (Patient taking differently: Take 40 mg by mouth every other day. Hold)  . metoprolol tartrate (LOPRESSOR) 25 MG tablet Take 1 tablet (25 mg total) by mouth 2 (two) times daily.  Marland Kitchen oxyCODONE (OXY IR/ROXICODONE) 5 MG immediate release tablet Take 1 tablet (5 mg total) by mouth every 6 (six) hours as needed for severe pain. Must last 30 days.  . pantoprazole (PROTONIX) 40 MG tablet TAKE 1 TABLET BY MOUTH ONCE DAILY BEFORE BREAKFAST. (Patient not taking: Reported on 07/11/2020)  . promethazine (PHENERGAN) 25 MG tablet Take 25 mg by mouth 3 (three) times daily as needed for nausea.  (Patient not taking: Reported on 07/11/2020)   No facility-administered  encounter medications on file as of 07/11/2020.   Past Medical History:  Diagnosis Date  . ADHD (attention deficit hyperactivity disorder)   . Anxiety   . B12 deficiency 02/07/2011  . Back pain   . CAD (coronary artery disease)    a. s/p recent STEMI on 09/15/2017 with DES to RCA  . Celiac disease   . Folic acid deficiency 68/03/1570  . Headache   . Iron deficiency anemia secondary to celiac disease. 02/07/2011  . Myocardial infarction ([place]) 09/15/2017   Past Surgical History:  Procedure Laterality Date  . CHOLECYSTECTOMY    . COLONOSCOPY WITH PROPOFOL N/A 09/26/2017   Procedure: COLONOSCOPY WITH PROPOFOL;  Surgeon: Rogene Houston, MD;  Location: [facility];  Service: Endoscopy;  Laterality: N/A;  . CORONARY STENT INTERVENTION Right 09/15/2017   Procedure: CORONARY STENT INTERVENTION;  Surgeon: Jettie Booze, MD;  Location: [place] [facility];  Service:  Cardiovascular;  Laterality: Right;  RCA  . KNEE CARTILAGE SURGERY Right   . LEFT HEART CATH AND CORONARY ANGIOGRAPHY N/A 09/15/2017   Procedure: LEFT HEART CATH AND CORONARY ANGIOGRAPHY;  Surgeon: Jettie Booze, MD;  Location: [place] [facility];  Service: Cardiovascular;  Laterality: N/A;  . TONSILLECTOMY      Family history  Father is 36 years old and has fatty liver. Mother is 52 years old and history of lymphoma breast and skin cancer.  She also has small cell carcinoma of the lung. Her Sister [name] was diagnosed with colon carcinoma at age 11.  She remains in remission.  Genetic testing has been negative.   Physical examination  Blood pressure 119/84, pulse 84, temperature 98.5 F (36.9 C), temperature source Oral, height 5' (1.524 m), weight 227 lb 1.6 oz (103 kg). Patient is sitting in a wheelchair holding emesis bag.  She is heaving repeatedly and bringing up scant amount of clear fluid. Conjunctiva is pink. Sclera is nonicteric Oropharyngeal mucosa is normal. No neck masses or thyromegaly noted. Cardiac  exam with regular rhythm normal S1 and S2. No murmur or gallop noted. Lungs are clear to auscultation. Abdomen is obese.  Bowel sounds are normal.  On palpation she has mild tenderness in epigastric region.  No organomegaly or masses.  Examination is somewhat limited. She has 2+ pitting edema involving both legs.  Edema extends to below the level of knees. She does not have clubbing or koilonychia.  Labs/studies Results:   CBC Latest Ref Rng & Units 07/09/2020 06/25/2020 06/25/2020  WBC 4.0 - 10.5 K/uL 11.0(H) - 7.2  Hemoglobin 12.0 - 15.0 g/dL 14.5 15.0 13.7  Hematocrit 36.0 - 46.0 % 44.4 44.0 41.1  Platelets 150 - 400 K/uL 538(H) - 372    CMP Latest Ref Rng & Units 07/09/2020 06/25/2020 06/25/2020  Glucose 70 - 99 mg/dL 103(H) 88 97  BUN 6 - 20 mg/dL _0 Creatinine 0.44 - 1.00 mg/dL 0.92 0.70 0.73  Sodium 135 - 145 mmol/L 137 140 138  Potassium 3.5 - 5.1 mmol/L 2.8(L) 3.4(L) 2.9(L)  Chloride 98 - 111 mmol/L 108 107 107  CO2 22 - 32 mmol/L 20(L) - 23  Calcium 8.9 - 10.3 mg/dL 7.9(L) - 7.7(L)  Total Protein 6.5 - 8.1 g/dL 5.0(L) - 4.7(L)  Total Bilirubin 0.3 - 1.2 mg/dL 1.1 - 0.8  Alkaline Phos 38 - 126 U/L 457(H) - 527(H)  AST 15 - 41 U/L 116(H) - 209(H)  ALT 0 - 44 U/L 82(H) - 118(H)    Hepatic Function Latest Ref Rng & Units 07/09/2020 06/25/2020 06/07/2020  Total Protein 6.5 - 8.1 g/dL 5.0(L) 4.7(L) 4.5(L)  Albumin 3.5 - 5.0 g/dL 2.2(L) 2.2(L) 2.2(L)  AST 15 - 41 U/L 116(H) 209(H) 74(H)  ALT 0 - 44 U/L 82(H) 118(H) 70(H)  Alk Phosphatase 38 - 126 U/L 457(H) 527(H) 223(H)  Total Bilirubin 0.3 - 1.2 mg/dL 1.1 0.8 0.4  Bilirubin, Direct 0.0 - 0.2 mg/dL - - 0.1      Assessment:  #1.  Nausea and vomiting.  Patient has been experiencing daily nausea and vomiting for about 10 weeks.  She has been heaving in the office and bringing out scant amount of clear fluid.  She has not responded to outpatient therapy.  Outpatient evaluation does not appear to be appropriate here.  I feel she  needs to be hospitalized.  I have contacted Dr. Gilford Raid of emergency room physician and updated on condition. Differential diagnosis  is quite broad and includes peptic ulcer disease, gastroparesis, relapse of celiac disease and she could also have nausea and vomiting due to narcotic withdrawal as she has not been able to keep pain meds down.  Once electrolyte abnormalities have been addressed she would benefit from diagnostic esophagogastroduodenoscopy.  #2.  Hypokalemia.  Review of her lab data reveals that she is at low potassium since June 06, 2020.  2 days ago it was 2.8.  She will need IV KCl as she is unable to tolerate oral preparation.  #3.  Diarrhea.  She has had diarrhea for 1 month.  GI pathogen panel and C. difficile testing was negative on 06/06/2020.  Diarrhea could be due to celiac disease or narcotic withdrawal.  Infection less likely given negative stool studies 5 weeks ago.  Nevertheless stool studies will be repeated before considering further evaluation.  #4.  Abnormal LFTs.  She has elevated transaminases.  She has known fatty liver.  However patient's serum albumin is quite low at 2.2.  Hypoalbuminemia most likely due to inability to keep food down but need to rule out nephrotic syndrome.  Alkaline phosphatase is markedly elevated and raises the possibility of cholestasis.  CT from Jul 06, 2020 revealed pronounced fatty liver but no stigmata of cirrhosis.  She was on statin which has been discontinued.  Will screen for primary biliary cholangitis.  #5.  History of colonic adenomas.  She had colonoscopy in July 2019 for rectal bleeding and found to have 5 small polyps which were not removed because antiplatelet repeat could not be interrupted(STEMI few days prior to admission)   Recommendations  Medication list updated. Patient will need to be hospitalized. Dr. Gilford Raid of [facility] ER contacted. Metoclopramide 10 mg IV 4 times a day. Pantoprazole 40 mg IV every 12 hours. Would  recommend repeat stool C. difficile antigen toxin and GI pathogen panel. Mitochondrial antibody with next blood draw. Esophagogastroduodenoscopy on 07/12/2020 illness serum potassium does not correct with therapy.

## 2020-07-11 NOTE — ED Notes (Signed)
Pt placed on cardiac monitor with BP to set cycle every 30 minutes. Continuous pulse oximeter applied.

## 2020-07-12 ENCOUNTER — Observation Stay (HOSPITAL_COMMUNITY): Payer: Self-pay

## 2020-07-12 ENCOUNTER — Encounter (HOSPITAL_COMMUNITY)
Admission: EM | Disposition: A | Discharge status: Home / Self Care | Payer: Self-pay | Source: Ambulatory Visit | Attending: Internal Medicine

## 2020-07-12 ENCOUNTER — Encounter: Payer: Self-pay | Admitting: Pain Medicine

## 2020-07-12 ENCOUNTER — Observation Stay (HOSPITAL_COMMUNITY): Payer: Self-pay | Admitting: Certified Registered"

## 2020-07-12 ENCOUNTER — Encounter (HOSPITAL_COMMUNITY): Payer: Self-pay | Admitting: Family Medicine

## 2020-07-12 DIAGNOSIS — R197 Diarrhea, unspecified: Secondary | ICD-10-CM

## 2020-07-12 DIAGNOSIS — R945 Abnormal results of liver function studies: Secondary | ICD-10-CM

## 2020-07-12 DIAGNOSIS — K900 Celiac disease: Secondary | ICD-10-CM

## 2020-07-12 DIAGNOSIS — R112 Nausea with vomiting, unspecified: Secondary | ICD-10-CM | POA: Diagnosis present

## 2020-07-12 DIAGNOSIS — R1013 Epigastric pain: Secondary | ICD-10-CM

## 2020-07-12 DIAGNOSIS — K3189 Other diseases of stomach and duodenum: Secondary | ICD-10-CM

## 2020-07-12 DIAGNOSIS — E876 Hypokalemia: Secondary | ICD-10-CM

## 2020-07-12 DIAGNOSIS — K2970 Gastritis, unspecified, without bleeding: Secondary | ICD-10-CM

## 2020-07-12 DIAGNOSIS — R7401 Elevation of levels of liver transaminase levels: Secondary | ICD-10-CM

## 2020-07-12 HISTORY — PX: BIOPSY: SHX5522

## 2020-07-12 HISTORY — PX: ESOPHAGOGASTRODUODENOSCOPY (EGD) WITH PROPOFOL: SHX5813

## 2020-07-12 LAB — CBC WITH DIFFERENTIAL/PLATELET
Abs Immature Granulocytes: 0.03 10*3/uL (ref 0.00–0.07)
Basophils Absolute: 0 10*3/uL (ref 0.0–0.1)
Basophils Relative: 0 %
Eosinophils Absolute: 0 10*3/uL (ref 0.0–0.5)
Eosinophils Relative: 0 %
HCT: 36 % (ref 36.0–46.0)
Hemoglobin: 12 g/dL (ref 12.0–15.0)
Immature Granulocytes: 0 %
Lymphocytes Relative: 41 %
Lymphs Abs: 2.9 10*3/uL (ref 0.7–4.0)
MCH: 25.8 pg — ABNORMAL LOW (ref 26.0–34.0)
MCHC: 33.3 g/dL (ref 30.0–36.0)
MCV: 77.4 fL — ABNORMAL LOW (ref 80.0–100.0)
Monocytes Absolute: 0.4 10*3/uL (ref 0.1–1.0)
Monocytes Relative: 5 %
Neutro Abs: 3.7 10*3/uL (ref 1.7–7.7)
Neutrophils Relative %: 54 %
Platelets: 427 10*3/uL — ABNORMAL HIGH (ref 150–400)
RBC: 4.65 MIL/uL (ref 3.87–5.11)
RDW: 18.9 % — ABNORMAL HIGH (ref 11.5–15.5)
WBC: 7.1 10*3/uL (ref 4.0–10.5)
nRBC: 0 % (ref 0.0–0.2)

## 2020-07-12 LAB — COMPREHENSIVE METABOLIC PANEL
ALT: 67 U/L — ABNORMAL HIGH (ref 0–44)
AST: 101 U/L — ABNORMAL HIGH (ref 15–41)
Albumin: 2 g/dL — ABNORMAL LOW (ref 3.5–5.0)
Alkaline Phosphatase: 379 U/L — ABNORMAL HIGH (ref 38–126)
Anion gap: 7 (ref 5–15)
BUN: 7 mg/dL (ref 6–20)
CO2: 23 mmol/L (ref 22–32)
Calcium: 7.6 mg/dL — ABNORMAL LOW (ref 8.9–10.3)
Chloride: 110 mmol/L (ref 98–111)
Creatinine, Ser: 0.74 mg/dL (ref 0.44–1.00)
GFR, Estimated: >60 mL/min (ref >60)
Glucose, Bld: 88 mg/dL (ref 70–99)
Potassium: 3.5 mmol/L (ref 3.5–5.1)
Sodium: 140 mmol/L (ref 135–145)
Total Bilirubin: 1 mg/dL (ref 0.3–1.2)
Total Protein: 4.3 g/dL — ABNORMAL LOW (ref 6.5–8.1)

## 2020-07-12 LAB — HEPATITIS PANEL, ACUTE
HCV Ab: NONREACTIVE
Hep A IgM: NONREACTIVE
Hep B C IgM: NONREACTIVE
Hepatitis B Surface Ag: NONREACTIVE

## 2020-07-12 LAB — MAGNESIUM: Magnesium: 1.8 mg/dL (ref 1.7–2.4)

## 2020-07-12 LAB — MITOCHONDRIAL ANTIBODIES: Mitochondrial M2 Ab, IgG: <20 Units (ref 0.0–20.0)

## 2020-07-12 SURGERY — ESOPHAGOGASTRODUODENOSCOPY (EGD) WITH PROPOFOL
Anesthesia: General

## 2020-07-12 MED ORDER — STERILE WATER FOR IRRIGATION IR SOLN
Status: DC | PRN
  Administered 2020-07-12: 200 mL

## 2020-07-12 MED ORDER — PROPOFOL 10 MG/ML IV BOLUS
INTRAVENOUS | Status: DC | PRN
  Administered 2020-07-12: 100 mg via INTRAVENOUS

## 2020-07-12 MED ORDER — LACTATED RINGERS IV SOLN
INTRAVENOUS | Status: DC
  Administered 2020-07-12: 1000 mL via INTRAVENOUS

## 2020-07-12 MED ORDER — LIDOCAINE HCL (CARDIAC) PF 100 MG/5ML IV SOSY
PREFILLED_SYRINGE | INTRAVENOUS | Status: DC | PRN
  Administered 2020-07-12: 50 mg via INTRAVENOUS

## 2020-07-12 MED ORDER — PROPOFOL 500 MG/50ML IV EMUL
INTRAVENOUS | Status: DC | PRN
  Administered 2020-07-12: 150 ug/kg/min via INTRAVENOUS

## 2020-07-12 NOTE — Progress Notes (Signed)
Brief EGD note.  Normal mucosa of the esophagus and GE junction. Antral gastritis with erythema and edema.  Biopsy taken. Scalloping to bulbar mucosa as well as post bulbar mucosa.  Biopsy taken.

## 2020-07-12 NOTE — Telephone Encounter (Signed)
error 

## 2020-07-12 NOTE — Op Note (Signed)
[facility] Patient Name: Sharon Lawrence Procedure Date: 07/12/2020 11:33 AM MRN: [id number] Date of Birth: 10/30/1965 Attending MD: Hildred Laser , MD CSN: [id number] Age: 55 Admit Type: Inpatient Procedure:                Upper GI endoscopy Indications:              Nausea with vomiting. Epigastric abdominal pain,                            known Celiac disease, Providers:                Hildred Laser, MD, Lurline Del, RN, Casimer Bilis, Technician Referring MD:             Vidal Schwalbe, MD Medicines:                Propofol per Anesthesia Complications:            No immediate complications. Estimated Blood Loss:     Estimated blood loss was minimal. Procedure:                Pre-Anesthesia Assessment:                           - Prior to the procedure, a History and Physical                            was performed, and patient medications and                            allergies were reviewed. The patient's tolerance of                            previous anesthesia was also reviewed. The risks                            and benefits of the procedure and the sedation                            options and risks were discussed with the patient.                            All questions were answered, and informed consent                            was obtained. Prior Anticoagulants: The patient                            last took heparin on the day of the procedure. ASA                            Grade Assessment: III - A patient with severe  systemic disease. After reviewing the risks and                            benefits, the patient was deemed in satisfactory                            condition to undergo the procedure.                           After obtaining informed consent, the endoscope was                            passed under direct vision. Throughout the                            procedure, the patient's  blood pressure, pulse, and                            oxygen saturations were monitored continuously. The                            [id number]-[id number] ([id number]) scope was introduced through the                            mouth, and advanced to the second part of duodenum.                            The upper GI endoscopy was accomplished without                            difficulty. The patient tolerated the procedure                            well. Scope In: 11:55:02 AM Scope Out: 12:04:20 PM Total Procedure Duration: 0 hours 9 minutes 18 seconds  Findings:      The hypopharynx was normal.      The examined esophagus was normal.      The Z-line was regular and was found 35 cm from the incisors.      Diffuse mild mucosal changes characterized by congestion and erythema       were found in the gastric antrum. Biopsies were taken with a cold       forceps for histology. The pathology specimen was placed into Bottle       Number 2.      The exam of the stomach was otherwise normal.      Diffuse moderate mucosal changes characterized by nodularity, scalloping       and altered texture were found in the duodenal bulb and in the second       portion of the duodenum. Biopsies were taken with a cold forceps for       histology. The pathology specimen was placed into Bottle Number 1. Impression:               - Normal hypopharynx.                           -  Normal esophagus.                           - Z-line regular, 35 cm from the incisors.                           - Congested and erythematous mucosa in the antrum.                            Biopsied.                           - Mucosal changes in the duodenum. Biopsied.                           comment: very pronounced changes to suggest active                            celiac disease. ? refractory disease vs                            non-compliance. Moderate Sedation:      Per Anesthesia Care Recommendation:           - Return patient to  hospital ward for ongoing care.                           - Full liquid gluten free diet today.                           - Continue present medications.                           - Await pathology results.                           - Resume Heprin tomororrow morning. Procedure Code(s):        --- Professional ---                           [phone], Esophagogastroduodenoscopy, flexible,                            transoral; with biopsy, single or multiple Diagnosis Code(s):        --- Professional ---                           K31.89, Other diseases of stomach and duodenum                           R10.13, Epigastric pain                           K90.0, Celiac disease                           R11.2, Nausea with vomiting, unspecified CPT copyright 2019 [employer]. All rights reserved. The codes documented  in this report are preliminary and upon coder review may  be revised to meet current compliance requirements. Hildred Laser, MD Hildred Laser, MD 07/12/2020 12:17:04 PM This report has been signed electronically. Number of Addenda: 0

## 2020-07-12 NOTE — Progress Notes (Signed)
PROGRESS NOTE    Sharon Lawrence  NIO:[id number] DOB: February 18, 1966 DOA: 07/11/2020 PCP: Vidal Schwalbe, MD    Brief Narrative:  55 y/o female with history of celiac disease, admitted with intractable nausea and vomiting. She is followed by Dr. Laural Golden. She was noted to be severely hypokalemic. Seen by GI and plans are for inpatient EGD. Stool studies ordered for diarrhea   Assessment & Plan:   Active Problems:   Intractable nausea and vomiting   Intractable nausea and vomiting -overall symptoms appear to be improving -GI following -plans are for EGD today -resume diet per GI after EGD  Hypokalemia -related to low GI losses -continue replacement -Magnesium 1.8  Diarrhea -somewhat chronic -Stool studies ordered per GI -continue supportive management -resume imodium when GI studies negative  Abnormal LFTs -history of fatty liver -improving with IV fluids -hold statin -RUQ ultrasound does not show any obstructive process -viral hepatitis panel in process -mitochondrial antibodies in process  Hypoalbuminemia -likely related to poor po intake -UA has been ordered to check for proteinuria  HTN -holding metoprolol since patient is normotensive   DVT prophylaxis: SCDs Start: 07/11/20 2101  Code Status: full code Family Communication: discussed with patient Disposition Plan: Status is: Observation  The patient remains OBS appropriate and will d/c before 2 midnights.  Dispo: The patient is from: Home              Anticipated d/c is to: Home              Patient currently is not medically stable to d/c.   Difficult to place patient No         Consultants:   GI  Procedures:     Antimicrobials:       Subjective: No further vomiting. She says that she continues to have loose stools, although stool studies have not been sent.  No abdominal pain.  She does feel general soreness in her abdomen from recent vomiting episodes  Objective: Vitals:   07/11/20  2155 07/12/20 0220 07/12/20 0500 07/12/20 0944  BP: 140/76 (!) 144/84 140/80 120/80  Pulse: 69 72 69 81  Resp: 20  18 18   Temp: 98 F (36.7 C) 98.3 F (36.8 C) 98.2 F (36.8 C) 98.1 F (36.7 C)  TempSrc: Oral Oral Oral Oral  SpO2: 99% 98% 99% 98%  Weight: 102.9 kg     Height: 5' (1.524 m)      No intake or output data in the 24 hours ending 07/12/20 1010 Filed Weights   07/11/20 1618 07/11/20 2155  Weight: 103 kg 102.9 kg    Examination:  General exam: Appears calm and comfortable  Respiratory system: Clear to auscultation. Respiratory effort normal. Cardiovascular system: S1 & S2 heard, RRR. No JVD, murmurs, rubs, gallops or clicks. No pedal edema. Gastrointestinal system: Abdomen is nondistended, soft and nontender. No organomegaly or masses felt. Normal bowel sounds heard. Central nervous system: Alert and oriented. No focal neurological deficits. Extremities: Symmetric 5 x 5 power. Skin: No rashes, lesions or ulcers Psychiatry: Judgement and insight appear normal. Mood & affect appropriate.     Data Reviewed: I have personally reviewed following labs and imaging studies  CBC: Recent Labs  Lab 07/09/20 1750 07/11/20 1608 07/12/20 0552  WBC 11.0* 9.6 7.1  NEUTROABS  --  4.7 3.7  HGB 14.5 13.8 12.0  HCT 44.4 40.7 36.0  MCV 77.1* 76.9* 77.4*  PLT 538* 529* 818*   Basic Metabolic Panel: Recent Labs  Lab 07/09/20 1750  07/11/20 1608 07/11/20 1827 07/12/20 0552  NA 137 137  --  140  K 2.8* 2.2*  --  3.5  CL 108 104  --  110  CO2 20* 24  --  23  GLUCOSE 103* 89  --  88  BUN 6 8  --  7  CREATININE 0.92 0.78  --  0.74  CALCIUM 7.9* 7.8*  --  7.6*  MG 1.9  --  1.8 1.8   GFR: Estimated Creatinine Clearance: 89 mL/min (by C-G formula based on SCr of 0.74 mg/dL). Liver Function Tests: Recent Labs  Lab 07/09/20 1750 07/11/20 1608 07/12/20 0552  AST 116* 122* 101*  ALT 82* 85* 67*  ALKPHOS 457* 509* 379*  BILITOT 1.1 1.1 1.0  PROT 5.0* 5.4* 4.3*   ALBUMIN 2.2* 2.6* 2.0*   Recent Labs  Lab 07/09/20 1750 07/11/20 1838  LIPASE 19 22   No results for input(s): AMMONIA in the last 168 hours. Coagulation Profile: Recent Labs  Lab 07/11/20 1608  INR 1.0   Cardiac Enzymes: No results for input(s): CKTOTAL, CKMB, CKMBINDEX, TROPONINI in the last 168 hours. BNP (last 3 results) No results for input(s): PROBNP in the last 8760 hours. HbA1C: No results for input(s): HGBA1C in the last 72 hours. CBG: No results for input(s): GLUCAP in the last 168 hours. Lipid Profile: No results for input(s): CHOL, HDL, LDLCALC, TRIG, CHOLHDL, LDLDIRECT in the last 72 hours. Thyroid Function Tests: No results for input(s): TSH, T4TOTAL, FREET4, T3FREE, THYROIDAB in the last 72 hours. Anemia Panel: No results for input(s): VITAMINB12, FOLATE, FERRITIN, TIBC, IRON, RETICCTPCT in the last 72 hours. Sepsis Labs: Recent Labs  Lab 07/11/20 1827  LATICACIDVEN 1.0    Recent Results (from the past 240 hour(s))  Resp Panel by RT-PCR (Flu A&B, Covid) Nasopharyngeal Swab     Status: None   Collection Time: 07/11/20  4:45 PM   Specimen: Nasopharyngeal Swab; Nasopharyngeal(NP) swabs in vial transport medium  Result Value Ref Range Status   SARS Coronavirus 2 by RT PCR NEGATIVE NEGATIVE Final    Comment: (NOTE) SARS-CoV-2 target nucleic acids are NOT DETECTED.  The SARS-CoV-2 RNA is generally detectable in upper respiratory specimens during the acute phase of infection. The lowest concentration of SARS-CoV-2 viral copies this assay can detect is 138 copies/mL. A negative result does not preclude SARS-Cov-2 infection and should not be used as the sole basis for treatment or other patient management decisions. A negative result may occur with  improper specimen collection/handling, submission of specimen other than nasopharyngeal swab, presence of viral mutation(s) within the areas targeted by this assay, and inadequate number of viral copies(<138  copies/mL). A negative result must be combined with clinical observations, patient history, and epidemiological information. The expected result is Negative.  Fact Sheet for Patients:  EntrepreneurPulse.com.au  Fact Sheet for Healthcare Providers:  IncredibleEmployment.be  This test is no t yet approved or cleared by the [place] FDA and  has been authorized for detection and/or diagnosis of SARS-CoV-2 by FDA under an Emergency Use Authorization (EUA). This EUA will remain  in effect (meaning this test can be used) for the duration of the COVID-19 declaration under Section 564(b)(1) of the Act, 21 U.S.C.section 360bbb-3(b)(1), unless the authorization is terminated  or revoked sooner.       Influenza A by PCR NEGATIVE NEGATIVE Final   Influenza B by PCR NEGATIVE NEGATIVE Final    Comment: (NOTE) The Xpert Xpress SARS-CoV-2/FLU/RSV plus assay is intended as an aid  in the diagnosis of influenza from Nasopharyngeal swab specimens and should not be used as a sole basis for treatment. Nasal washings and aspirates are unacceptable for Xpert Xpress SARS-CoV-2/FLU/RSV testing.  Fact Sheet for Patients: EntrepreneurPulse.com.au  Fact Sheet for Healthcare Providers: IncredibleEmployment.be  This test is not yet approved or cleared by the [place] FDA and has been authorized for detection and/or diagnosis of SARS-CoV-2 by FDA under an Emergency Use Authorization (EUA). This EUA will remain in effect (meaning this test can be used) for the duration of the COVID-19 declaration under Section 564(b)(1) of the Act, 21 U.S.C. section 360bbb-3(b)(1), unless the authorization is terminated or revoked.  Performed at [facility], [address], [place],  [address]          Radiology Studies: US Abdomen Limited RUQ (LIVER/GB)  Result Date: 07/12/2020 CLINICAL DATA:  Transaminitis on recent labs.  EXAM: ULTRASOUND ABDOMEN LIMITED RIGHT UPPER QUADRANT COMPARISON:  CT abdomen 06/25/2020 FINDINGS: Gallbladder: Surgically absent Common bile duct: Diameter: 5 mm Liver: No focal lesion identified. Increased hepatic parenchymal echogenicity with subtle nodularity along the liver surface particularly along the left hepatic lobe. Portal vein is patent on color Doppler imaging with normal direction of blood flow towards the liver. Other: None. IMPRESSION: 1. Prior cholecystectomy. 2. Increased hepatic echogenicity with subtle nodularity along the liver surface. Overall appearance is concerning for of hepatic steatosis with early cirrhosis. Electronically Signed   By: Kathreen Devoid   On: 07/12/2020 08:55        Scheduled Meds: . aspirin  81 mg Oral Daily  . baclofen  20 mg Oral QHS  . DULoxetine  20 mg Oral Daily  . famotidine  40 mg Oral QHS  . metoCLOPramide (REGLAN) injection  10 mg Intravenous Q6H  . pantoprazole (PROTONIX) IV  40 mg Intravenous Q12H  . pregabalin  150 mg Oral Daily   Continuous Infusions: . sodium chloride 100 mL/hr at 07/12/20 0440     LOS: 0 days    Time spent: 63mns    JKathie Dike MD [facility]   If 7PM-7AM, please contact night-coverage [email or web address]  07/12/2020, 10:10 AM

## 2020-07-12 NOTE — Anesthesia Procedure Notes (Signed)
Date/Time: 07/12/2020 11:57 AM Performed by: Orlie Dakin, CRNA Pre-anesthesia Checklist: Patient identified, Emergency Drugs available, Suction available and Patient being monitored Patient Re-evaluated:Patient Re-evaluated prior to induction Oxygen Delivery Method: Nasal cannula Induction Type: IV induction Placement Confirmation: positive ETCO2

## 2020-07-12 NOTE — Progress Notes (Signed)
Brief progress note:   Briefly saw patient this morning. No acute events overnight.  N.p.o. since midnight.  Hypokalemia improved.  Potassium 3.5 this morning.  She is feeling improved in regards to nausea and vomiting.  She is having dry heaves in the emergency room, but no vomiting since presenting to the emergency room.  No nausea at this time.  She is receiving Reglan 10 mg every 6 hours, Protonix 40 mg twice daily, and Zofran as needed.  Denies abdominal pain or dysphagia.   She is ready to proceed with EGD today as planned.   She continues with diarrhea which has been present since February with up to 10 BMs per day.  She had about 4 bowel movements overnight with last bowel movement around 6 AM this morning.  Denies BRBPR or melena.  There is some question whether she is following a true gluten-free diet in the setting of celiac disease and possible narcotic withdrawal in the setting of nausea/vomiting with inability to keep medications down.  Stool studies have been ordered, but not collected. Will need to follow-up on this.   Elevated LFTs: Dating back to 2019, but increasing more recently. Known history of fatty liver.  She was on a statin which was discontinued.  LFTs are slowly improving. Dr. Laural Golden recommended evaluating for primary biliary cholangitis in light of marked alk phos elevation. AMA and acute hepatitis panel are pending. She is having an RUQ [place] this morning.   Plan:  1.  Proceed with EGD with propofol with Dr. Laural Golden today. The risks, benefits, and alternatives have been discussed with the patient in detail. The patient states understanding and desires to proceed.  ASA III *She did receive heparin this morning at 6:38 AM.  I will discontinue heparin for now.  Timing to resume per Dr. Laural Golden.   2. Continue Reglan 10 mg q6 hours for now.   3.  Continue Protonix BID  4.  Follow-up on stool studies.  5.  Follow-up on AMA, acute hepatitis panel, and ultrasound.    Aliene Altes, PA-C [facility] 07/12/2020

## 2020-07-12 NOTE — Anesthesia Postprocedure Evaluation (Signed)
Anesthesia Post Note  Patient: Sharon Lawrence  Procedure(s) Performed: ESOPHAGOGASTRODUODENOSCOPY (EGD) WITH PROPOFOL (N/A ) BIOPSY  Patient location during evaluation: PACU Anesthesia Type: General Level of consciousness: awake and alert and oriented Pain management: pain level controlled Vital Signs Assessment: post-procedure vital signs reviewed and stable Respiratory status: spontaneous breathing and respiratory function stable Cardiovascular status: stable and blood pressure returned to baseline Postop Assessment: no apparent nausea or vomiting Anesthetic complications: no   No complications documented.   Last Vitals:  Vitals:   07/12/20 1245 07/12/20 1330  BP: 140/78 (!) 135/92  Pulse: 74 70  Resp: 17 18  Temp:  36.9 C  SpO2: 98% 100%    Last Pain:  Vitals:   07/12/20 1330  TempSrc: Oral  PainSc:                  Louretta Tantillo C Jillyn Stacey

## 2020-07-12 NOTE — Progress Notes (Signed)
Attempted to call report but Nurse on floor has no phone and is not present at desk. Nurse to call back to receive report. Patient ready for transfer back to room.

## 2020-07-12 NOTE — Transfer of Care (Signed)
Immediate Anesthesia Transfer of Care Note  Patient: Sharon Lawrence  Procedure(s) Performed: ESOPHAGOGASTRODUODENOSCOPY (EGD) WITH PROPOFOL (N/A ) BIOPSY  Patient Location: PACU  Anesthesia Type:General  Level of Consciousness: awake, alert  and oriented  Airway & Oxygen Therapy: Patient Spontanous Breathing  Post-op Assessment: Report given to RN and Post -op Vital signs reviewed and stable  Post vital signs: Reviewed and stable  Last Vitals:  Vitals Value Taken Time  BP 132/91 07/12/20 1212  Temp 36.7 C 07/12/20 1212  Pulse 86 07/12/20 1214  Resp 21 07/12/20 1214  SpO2 100 % 07/12/20 1214  Vitals shown include unvalidated device data.  Last Pain:  Vitals:   07/12/20 1123  TempSrc: Oral  PainSc: 0-No pain         Complications: No complications documented.

## 2020-07-12 NOTE — Anesthesia Preprocedure Evaluation (Signed)
Anesthesia Evaluation  Patient identified by MRN, date of birth, ID band Patient awake    Reviewed: Allergy & Precautions, NPO status , Patient's Chart, lab work & pertinent test results  Airway Mallampati: II  TM Distance: >3 FB Neck ROM: Full    Dental  (+) Teeth Intact, Dental Advisory Given   Pulmonary asthma , former smoker,    Pulmonary exam normal breath sounds clear to auscultation       Cardiovascular Exercise Tolerance: Good hypertension, Pt. on medications + CAD, + Past MI and + Cardiac Stents (2019)  Normal cardiovascular exam Rhythm:Regular Rate:Normal     Neuro/Psych  Headaches, PSYCHIATRIC DISORDERS Anxiety Depression  Neuromuscular disease    GI/Hepatic GERD  Medicated and Controlled,  Endo/Other  Hypothyroidism Morbid obesity  Renal/GU      Musculoskeletal  (+) Arthritis , Osteoarthritis,    Abdominal   Peds  (+) ADHD Hematology  (+) anemia ,   Anesthesia Other Findings   Reproductive/Obstetrics                             Anesthesia Physical Anesthesia Plan  ASA: III  Anesthesia Plan: General   Post-op Pain Management:    Induction: Intravenous  PONV Risk Score and Plan: Propofol infusion  Airway Management Planned: Nasal Cannula and Natural Airway  Additional Equipment:   Intra-op Plan:   Post-operative Plan:   Informed Consent: I have reviewed the patients History and Physical, chart, labs and discussed the procedure including the risks, benefits and alternatives for the proposed anesthesia with the patient or authorized representative who has indicated his/her understanding and acceptance.     Dental advisory given  Plan Discussed with: CRNA and Surgeon  Anesthesia Plan Comments:         Anesthesia Quick Evaluation

## 2020-07-13 ENCOUNTER — Encounter (HOSPITAL_COMMUNITY): Payer: Self-pay | Admitting: Internal Medicine

## 2020-07-13 DIAGNOSIS — R7401 Elevation of levels of liver transaminase levels: Principal | ICD-10-CM

## 2020-07-13 DIAGNOSIS — R197 Diarrhea, unspecified: Secondary | ICD-10-CM

## 2020-07-13 DIAGNOSIS — K900 Celiac disease: Principal | ICD-10-CM

## 2020-07-13 LAB — CBC
HCT: 40.7 % (ref 36.0–46.0)
Hemoglobin: 13.4 g/dL (ref 12.0–15.0)
MCH: 25.8 pg — ABNORMAL LOW (ref 26.0–34.0)
MCHC: 32.9 g/dL (ref 30.0–36.0)
MCV: 78.3 fL — ABNORMAL LOW (ref 80.0–100.0)
Platelets: 419 10*3/uL — ABNORMAL HIGH (ref 150–400)
RBC: 5.2 MIL/uL — ABNORMAL HIGH (ref 3.87–5.11)
RDW: 19.4 % — ABNORMAL HIGH (ref 11.5–15.5)
WBC: 8.3 10*3/uL (ref 4.0–10.5)
nRBC: 0 % (ref 0.0–0.2)

## 2020-07-13 LAB — HEPATIC FUNCTION PANEL
ALT: 76 U/L — ABNORMAL HIGH (ref 0–44)
AST: 107 U/L — ABNORMAL HIGH (ref 15–41)
Albumin: 2.3 g/dL — ABNORMAL LOW (ref 3.5–5.0)
Alkaline Phosphatase: 404 U/L — ABNORMAL HIGH (ref 38–126)
Bilirubin, Direct: 0.3 mg/dL — ABNORMAL HIGH (ref 0.0–0.2)
Indirect Bilirubin: 0.6 mg/dL (ref 0.3–0.9)
Total Bilirubin: 0.9 mg/dL (ref 0.3–1.2)
Total Protein: 4.8 g/dL — ABNORMAL LOW (ref 6.5–8.1)

## 2020-07-13 LAB — RENAL FUNCTION PANEL
Albumin: 2.2 g/dL — ABNORMAL LOW (ref 3.5–5.0)
Anion gap: 8 (ref 5–15)
BUN: 8 mg/dL (ref 6–20)
CO2: 20 mmol/L — ABNORMAL LOW (ref 22–32)
Calcium: 7.7 mg/dL — ABNORMAL LOW (ref 8.9–10.3)
Chloride: 111 mmol/L (ref 98–111)
Creatinine, Ser: 0.77 mg/dL (ref 0.44–1.00)
GFR, Estimated: >60 mL/min (ref >60)
Glucose, Bld: 94 mg/dL (ref 70–99)
Phosphorus: 3.6 mg/dL (ref 2.5–4.6)
Potassium: 2.9 mmol/L — ABNORMAL LOW (ref 3.5–5.1)
Sodium: 139 mmol/L (ref 135–145)

## 2020-07-13 LAB — C DIFFICILE QUICK SCREEN W PCR REFLEX
C Diff antigen: NEGATIVE
C Diff interpretation: NOT DETECTED
C Diff toxin: NEGATIVE

## 2020-07-13 LAB — URINALYSIS, ROUTINE W REFLEX MICROSCOPIC
Bilirubin Urine: NEGATIVE
Glucose, UA: NEGATIVE mg/dL
Hgb urine dipstick: NEGATIVE
Ketones, ur: NEGATIVE mg/dL
Nitrite: NEGATIVE
Protein, ur: 30 mg/dL — AB
Specific Gravity, Urine: 1.018 (ref 1.005–1.030)
pH: 6 (ref 5.0–8.0)

## 2020-07-13 LAB — MAGNESIUM: Magnesium: 1.8 mg/dL (ref 1.7–2.4)

## 2020-07-13 LAB — SURGICAL PATHOLOGY

## 2020-07-13 MED ORDER — METOCLOPRAMIDE HCL 5 MG/5ML PO SOLN
10.0000 mg | Freq: Three times a day (TID) | ORAL | Status: DC
  Administered 2020-07-13 – 2020-07-14 (×3): 10 mg via ORAL
  Filled 2020-07-13 (×3): qty 10

## 2020-07-13 MED ORDER — POTASSIUM CHLORIDE CRYS ER 20 MEQ PO TBCR
40.0000 meq | EXTENDED_RELEASE_TABLET | ORAL | Status: AC
  Administered 2020-07-13 (×3): 40 meq via ORAL
  Filled 2020-07-13 (×3): qty 2

## 2020-07-13 MED ORDER — LOPERAMIDE HCL 2 MG PO CAPS
2.0000 mg | ORAL_CAPSULE | Freq: Two times a day (BID) | ORAL | Status: DC
  Administered 2020-07-13 – 2020-07-14 (×3): 2 mg via ORAL
  Filled 2020-07-13 (×3): qty 1

## 2020-07-13 MED ORDER — DULOXETINE HCL 60 MG PO CPEP
80.0000 mg | ORAL_CAPSULE | Freq: Every day | ORAL | Status: DC
  Administered 2020-07-13: 80 mg via ORAL
  Filled 2020-07-13: qty 1

## 2020-07-13 NOTE — Progress Notes (Addendum)
Subjective:  Diarrhea this morning. Little color to it but watery. Some nausea but no vomiting. Not sure she is ready to advance diet yet. Wants liquids for lunch and then try a dinner.  Unfortunately she received products that may have had gluten in it on a full liquid diet tray.  No melena or rectal bleeding.  Husband at bedside.  Objective: Vital signs in last 24 hours: Temp:  [98 F (36.7 C)-98.7 F (37.1 C)] 98.1 F (36.7 C) (05/12 0435) Pulse Rate:  [70-92] 85 (05/12 0435) Resp:  [11-20] 16 (05/12 0435) BP: (120-147)/(78-97) 124/84 (05/12 0435) SpO2:  [93 %-100 %] 98 % (05/12 0435) Weight:  [725 kg] 103 kg (05/11 1123) Last BM Date: 07/12/20 General:   Alert,  Well-developed, well-nourished, pleasant and cooperative in NAD Head:  Normocephalic and atraumatic. Eyes:  Sclera clear, no icterus.  Abdomen:  Soft, nondistended.  Mild tenderness along umbilicus.  Normal bowel sounds, without guarding, and without rebound.   Extremities:  Without clubbing, deformity or edema. Neurologic:  Alert and  oriented x4;  grossly normal neurologically. Skin:  Intact without significant lesions or rashes. Psych:  Alert and cooperative. Normal mood and affect.  Intake/Output from previous day: 05/11 0701 - 05/12 0700 In: 2675.7 [I.V.:2675.7] Out: -  Intake/Output this shift: No intake/output data recorded.  Lab Results: CBC Recent Labs    07/11/20 1608 07/12/20 0552 07/13/20 0638  WBC 9.6 7.1 8.3  HGB 13.8 12.0 13.4  HCT 40.7 36.0 40.7  MCV 76.9* 77.4* 78.3*  PLT 529* 427* 419*   BMET Recent Labs    07/11/20 1608 07/12/20 0552 07/13/20 0638  NA 137 140 139  K 2.2* 3.5 2.9*  CL 104 110 111  CO2 24 23 20*  GLUCOSE 89 88 94  BUN 8 7 8   CREATININE 0.78 0.74 0.77  CALCIUM 7.8* 7.6* 7.7*   LFTs Recent Labs    07/11/20 1608 07/12/20 0552 07/13/20 0638  BILITOT 1.1 1.0 0.9  BILIDIR  --   --  0.3*  IBILI  --   --  0.6  ALKPHOS 509* 379* 404*  AST 122* 101* 107*  ALT  85* 67* 76*  PROT 5.4* 4.3* 4.8*  ALBUMIN 2.6* 2.0* 2.2*  2.3*   Recent Labs    07/11/20 1838  LIPASE 22   PT/INR Recent Labs    07/11/20 1608  LABPROT 13.6  INR 1.0    Lab Results  Component Value Date   VITAMINB12 422 09/16/2019   Lab Results  Component Value Date   IRON 59 09/16/2019   TIBC 436 09/16/2019   FERRITIN 53 09/16/2019       Imaging Studies: DG Chest 2 View  Result Date: 07/09/2020 CLINICAL DATA:  Nausea, vomiting and diarrhea. EXAM: CHEST - 2 VIEW COMPARISON:  June 06, 2020 FINDINGS: The heart size and mediastinal contours are within normal limits. Both lungs are clear. Radiopaque surgical clips are seen within the right upper quadrant. The visualized skeletal structures are unremarkable. IMPRESSION: No active cardiopulmonary disease. Electronically Signed   By: Virgina Norfolk M.D.   On: 07/09/2020 18:29   CT ABDOMEN PELVIS W CONTRAST  Result Date: 06/25/2020 CLINICAL DATA:  Nausea, vomiting, diarrhea approximately 8 weeks. EXAM: CT ABDOMEN AND PELVIS WITH CONTRAST TECHNIQUE: Multidetector CT imaging of the abdomen and pelvis was performed using the standard protocol following bolus administration of intravenous contrast. CONTRAST:  136m OMNIPAQUE IOHEXOL 300 MG/ML  SOLN COMPARISON:  09/25/2017 FINDINGS: Lower Chest: No acute findings. Hepatobiliary:  No hepatic masses identified. Severe diffuse hepatic steatosis again noted. Prior cholecystectomy. No evidence of biliary obstruction. Pancreas:  No mass or inflammatory changes. Spleen: Within normal limits in size and appearance. Adrenals/Urinary Tract: Stable small bilateral adrenal masses, consistent with benign adenomas. No renal masses identified. No evidence of ureteral calculi or hydronephrosis. Stomach/Bowel: No evidence of obstruction, inflammatory process or abnormal fluid collections. Normal appendix visualized. Vascular/Lymphatic: No pathologically enlarged lymph nodes. No acute vascular findings.  Reproductive: IUD seen in appropriate position. No mass or other significant abnormality. Other:  None. Musculoskeletal:  No suspicious bone lesions identified. IMPRESSION: No acute findings within the abdomen or pelvis. Stable severe hepatic steatosis. Stable benign bilateral adrenal adenomas. Electronically Signed   By: Marlaine Hind M.D.   On: 06/25/2020 15:02   NM Myocar Multi W/Spect W/Wall Motion / EF  Result Date: 06/27/2020  There was no ST segment deviation noted during stress.  The study is normal. There are no perfusion defects consistent with prior infarct or current ischemia  This is a low risk study.  The left ventricular ejection fraction is hyperdynamic (>65%).    US Abdomen Limited RUQ (LIVER/GB)  Result Date: 07/12/2020 CLINICAL DATA:  Transaminitis on recent labs. EXAM: ULTRASOUND ABDOMEN LIMITED RIGHT UPPER QUADRANT COMPARISON:  CT abdomen 06/25/2020 FINDINGS: Gallbladder: Surgically absent Common bile duct: Diameter: 5 mm Liver: No focal lesion identified. Increased hepatic parenchymal echogenicity with subtle nodularity along the liver surface particularly along the left hepatic lobe. Portal vein is patent on color Doppler imaging with normal direction of blood flow towards the liver. Other: None. IMPRESSION: 1. Prior cholecystectomy. 2. Increased hepatic echogenicity with subtle nodularity along the liver surface. Overall appearance is concerning for of hepatic steatosis with early cirrhosis. Electronically Signed   By: Kathreen Devoid   On: 07/12/2020 08:55  [2 weeks]   Assessment: 55 year old with history of celiac disease, B12 deficiency on parenteral replacement therapy, CAD with STEMI in July 2019, obesity, GERD, history of colonic polyps in July 2019 but not removed because of need for continued dual antiplatelet therapy presenting with nausea/vomiting/diarrhea/upper abdominal soreness worsening since February.  Chronic diarrhea: Since February.  Up to 10 stools per day.   No melena or rectal bleeding.  C. difficile and GI pathogen panel negative last month.  History of celiac disease, question refractory versus noncompliance.  Discussed at length with patient and spouse.  There is a possibility that she is getting some cross-contamination in the way her food is prepared at home.  She states she does not eat out very often.  She does admit to "cheating" on occasion but not weekly.  Chronic nausea and vomiting: Possibly multifactorial in the setting of active celiac disease, compounded by narcotic withdrawal when she could not keep down her p.o. pain medications, possible gastroparesis. No vomiting so far today.   Elevated LFTs: Dating back to 2019.  Increasing more recently.  Known fatty liver.  She was on a statin but this has been recently discontinued.  CT abdomen pelvis recently revealed  pronounced fatty liver but no cirrhosis.  U/S with ?subtle nodularity along the liver surface particularly along the left hepatic lobe, CBD 5 mm, gallbladder surgically absent.  Transaminases improving over the past 2 to 3 weeks.  AST peaked at 209, ALT peaked at 118 and down to 107/76 respectively.  Alkaline phosphatase peaked at 527 and is down to 404 today.  Total bilirubin normal. AMA negative.  And acute hepatitis panel negative.  We will continue to  follow.  Suspect multifactorial and poorly controlled celiac may be a role as well.  EGD this admission: Congestion and erythematous mucosa in the antrum status post biopsy.  Mucosal changes in the duodenum.  Pronounced changes suggesting active celiac disease?  Refractory disease versus noncompliance.  Biopsied.  History of colonic adenomatous colon:  Multiple colon polyps noted at time of colonoscopy in 2019 done for rectal bleeding, 5 small polyps which were not removed because of dual antiplatelet therapy cannot be interrupted (STEMI few days prior to admission).  Plan: 1. Gluten-free diet. She can request clear liquids if not  ready to advance. Avoid creamy soups or foods concerning for containing gluten.  2. Await pathology. 3. Continue pantoprazole twice daily. 4. Follow-up stool studies. 5. Continue metoclopramide for now. 6. Replete potassium per attending.  Laureen Ochs. Bernarda Caffey [facility] [phone] 5/12/202212:31 PM     LOS: 1 day

## 2020-07-13 NOTE — Progress Notes (Signed)
PROGRESS NOTE    Sharon Lawrence  YKD:[id number] DOB: Dec 02, 1965 DOA: 07/11/2020 PCP: Vidal Schwalbe, MD    Brief Narrative:  55 y/o female with history of celiac disease, admitted with intractable nausea and vomiting. She is followed by Dr. Laural Golden. She was noted to be severely hypokalemic. Seen by GI and plans are for inpatient EGD. Stool studies ordered for diarrhea   Assessment & Plan:   Active Problems:   Adult celiac disease   Intractable nausea and vomiting   Nausea and vomiting   Transaminitis   Intractable nausea and vomiting -overall symptoms appear to be improving -GI following -EGD shows severe, uncontrolled for celiac disease -Started on gluten-free diet  Hypokalemia -related to low GI losses -continue replacement -Magnesium 1.8  Diarrhea -somewhat chronic -Felt to be likely related to uncontrolled celiac disease -GI pathogen panel in process -continue supportive management -Started on Imodium  Abnormal LFTs -history of fatty liver -improving with IV fluids -hold statin -RUQ ultrasound does not show any obstructive process -viral hepatitis panel negative -mitochondrial antibodies negative  Hypoalbuminemia -likely related to poor po intake -She does have some protein in her urine based on urinalysis -Although I suspect the main cause of her hyponatremia has been poor p.o. intake here lately -If hypoalbuminemia persist despite adequate p.o. intake, could pursue further work-up with 24-hour urine protein  HTN -holding metoprolol since patient is normotensive   DVT prophylaxis: SCDs Start: 07/11/20 2101  Code Status: full code Family Communication: discussed with patient Disposition Plan: Status is: Inpatient  The patient will require care spanning > 2 midnights and should be moved to inpatient because: Persistent severe electrolyte disturbances  Dispo: The patient is from: Home              Anticipated d/c is to: Home              Patient  currently is not medically stable to d/c.   Difficult to place patient No         Consultants:   GI  Procedures:   EGD  Antimicrobials:       Subjective: Overall diarrhea is better today.  Vomiting is also better.  Tolerating diet.  Objective: Vitals:   07/12/20 1330 07/12/20 2141 07/13/20 0435 07/13/20 1423  BP: (!) 135/92 126/85 124/84 130/86  Pulse: 70 92 85 79  Resp: 18 15 16 16   Temp: 98.4 F (36.9 C) 98.5 F (36.9 C) 98.1 F (36.7 C) 97.8 F (36.6 C)  TempSrc: Oral  Oral Oral  SpO2: 100% 94% 98% 100%  Weight:      Height:        Intake/Output Summary (Last 24 hours) at 07/13/2020 1929 Last data filed at 07/13/2020 1300 Gross per 24 hour  Intake 1970 ml  Output --  Net 1970 ml   Filed Weights   07/11/20 1618 07/11/20 2155 07/12/20 1123  Weight: 103 kg 102.9 kg 103 kg    Examination:  General exam: Appears calm and comfortable  Respiratory system: Clear to auscultation. Respiratory effort normal. Cardiovascular system: S1 & S2 heard, RRR. No JVD, murmurs, rubs, gallops or clicks. No pedal edema. Gastrointestinal system: Abdomen is nondistended, soft and nontender. No organomegaly or masses felt. Normal bowel sounds heard. Central nervous system: Alert and oriented. No focal neurological deficits. Extremities: Symmetric 5 x 5 power. Skin: No rashes, lesions or ulcers Psychiatry: Judgement and insight appear normal. Mood & affect appropriate.     Data Reviewed: I have personally reviewed following labs  and imaging studies  CBC: Recent Labs  Lab 07/09/20 1750 07/11/20 1608 07/12/20 0552 07/13/20 0638  WBC 11.0* 9.6 7.1 8.3  NEUTROABS  --  4.7 3.7  --   HGB 14.5 13.8 12.0 13.4  HCT 44.4 40.7 36.0 40.7  MCV 77.1* 76.9* 77.4* 78.3*  PLT 538* 529* 427* 676*   Basic Metabolic Panel: Recent Labs  Lab 07/09/20 1750 07/11/20 1608 07/11/20 1827 07/12/20 0552 07/13/20 0638  NA 137 137  --  140 139  K 2.8* 2.2*  --  3.5 2.9*  CL 108  104  --  110 111  CO2 20* 24  --  23 20*  GLUCOSE 103* 89  --  88 94  BUN 6 8  --  7 8  CREATININE 0.92 0.78  --  0.74 0.77  CALCIUM 7.9* 7.8*  --  7.6* 7.7*  MG 1.9  --  1.8 1.8 1.8  PHOS  --   --   --   --  3.6   GFR: Estimated Creatinine Clearance: 89 mL/min (by C-G formula based on SCr of 0.77 mg/dL). Liver Function Tests: Recent Labs  Lab 07/09/20 1750 07/11/20 1608 07/12/20 0552 07/13/20 0638  AST 116* 122* 101* 107*  ALT 82* 85* 67* 76*  ALKPHOS 457* 509* 379* 404*  BILITOT 1.1 1.1 1.0 0.9  PROT 5.0* 5.4* 4.3* 4.8*  ALBUMIN 2.2* 2.6* 2.0* 2.2*  2.3*   Recent Labs  Lab 07/09/20 1750 07/11/20 1838  LIPASE 19 22   No results for input(s): AMMONIA in the last 168 hours. Coagulation Profile: Recent Labs  Lab 07/11/20 1608  INR 1.0   Cardiac Enzymes: No results for input(s): CKTOTAL, CKMB, CKMBINDEX, TROPONINI in the last 168 hours. BNP (last 3 results) No results for input(s): PROBNP in the last 8760 hours. HbA1C: No results for input(s): HGBA1C in the last 72 hours. CBG: No results for input(s): GLUCAP in the last 168 hours. Lipid Profile: No results for input(s): CHOL, HDL, LDLCALC, TRIG, CHOLHDL, LDLDIRECT in the last 72 hours. Thyroid Function Tests: No results for input(s): TSH, T4TOTAL, FREET4, T3FREE, THYROIDAB in the last 72 hours. Anemia Panel: No results for input(s): VITAMINB12, FOLATE, FERRITIN, TIBC, IRON, RETICCTPCT in the last 72 hours. Sepsis Labs: Recent Labs  Lab 07/11/20 1827  LATICACIDVEN 1.0    Recent Results (from the past 240 hour(s))  Resp Panel by RT-PCR (Flu A&B, Covid) Nasopharyngeal Swab     Status: None   Collection Time: 07/11/20  4:45 PM   Specimen: Nasopharyngeal Swab; Nasopharyngeal(NP) swabs in vial transport medium  Result Value Ref Range Status   SARS Coronavirus 2 by RT PCR NEGATIVE NEGATIVE Final    Comment: (NOTE) SARS-CoV-2 target nucleic acids are NOT DETECTED.  The SARS-CoV-2 RNA is generally detectable  in upper respiratory specimens during the acute phase of infection. The lowest concentration of SARS-CoV-2 viral copies this assay can detect is 138 copies/mL. A negative result does not preclude SARS-Cov-2 infection and should not be used as the sole basis for treatment or other patient management decisions. A negative result may occur with  improper specimen collection/handling, submission of specimen other than nasopharyngeal swab, presence of viral mutation(s) within the areas targeted by this assay, and inadequate number of viral copies(<138 copies/mL). A negative result must be combined with clinical observations, patient history, and epidemiological information. The expected result is Negative.  Fact Sheet for Patients:  EntrepreneurPulse.com.au  Fact Sheet for Healthcare Providers:  IncredibleEmployment.be  This test is no t  yet approved or cleared by the [place] and  has been authorized for detection and/or diagnosis of SARS-CoV-2 by FDA under an Emergency Use Authorization (EUA). This EUA will remain  in effect (meaning this test can be used) for the duration of the COVID-19 declaration under Section 564(b)(1) of the Act, 21 U.S.C.section 360bbb-3(b)(1), unless the authorization is terminated  or revoked sooner.       Influenza A by PCR NEGATIVE NEGATIVE Final   Influenza B by PCR NEGATIVE NEGATIVE Final    Comment: (NOTE) The Xpert Xpress SARS-CoV-2/FLU/RSV plus assay is intended as an aid in the diagnosis of influenza from Nasopharyngeal swab specimens and should not be used as a sole basis for treatment. Nasal washings and aspirates are unacceptable for Xpert Xpress SARS-CoV-2/FLU/RSV testing.  Fact Sheet for Patients: EntrepreneurPulse.com.au  Fact Sheet for Healthcare Providers: IncredibleEmployment.be  This test is not yet approved or cleared by the [place] FDA and has  been authorized for detection and/or diagnosis of SARS-CoV-2 by FDA under an Emergency Use Authorization (EUA). This EUA will remain in effect (meaning this test can be used) for the duration of the COVID-19 declaration under Section 564(b)(1) of the Act, 21 U.S.C. section 360bbb-3(b)(1), unless the authorization is terminated or revoked.  Performed at [facility], [address] [place], [place] [address]          Radiology Studies: US Abdomen Limited RUQ (LIVER/GB)  Result Date: 07/12/2020 CLINICAL DATA:  Transaminitis on recent labs. EXAM: ULTRASOUND ABDOMEN LIMITED RIGHT UPPER QUADRANT COMPARISON:  CT abdomen 06/25/2020 FINDINGS: Gallbladder: Surgically absent Common bile duct: Diameter: 5 mm Liver: No focal lesion identified. Increased hepatic parenchymal echogenicity with subtle nodularity along the liver surface particularly along the left hepatic lobe. Portal vein is patent on color Doppler imaging with normal direction of blood flow towards the liver. Other: None. IMPRESSION: 1. Prior cholecystectomy. 2. Increased hepatic echogenicity with subtle nodularity along the liver surface. Overall appearance is concerning for of hepatic steatosis with early cirrhosis. Electronically Signed   By: Kathreen Devoid   On: 07/12/2020 08:55        Scheduled Meds: . aspirin  81 mg Oral Daily  . baclofen  20 mg Oral QHS  . DULoxetine  80 mg Oral QHS  . famotidine  40 mg Oral QHS  . loperamide  2 mg Oral BID  . metoCLOPramide  10 mg Oral TID AC  . pantoprazole (PROTONIX) IV  40 mg Intravenous Q12H  . pregabalin  150 mg Oral Daily   Continuous Infusions: . sodium chloride 100 mL/hr at 07/13/20 0508     LOS: 1 day    Time spent: 67mns    JKathie Dike MD [facility]   If 7PM-7AM, please contact night-coverage [email or web address]  07/13/2020, 7:29 PM

## 2020-07-14 LAB — GASTROINTESTINAL PANEL BY PCR, STOOL (REPLACES STOOL CULTURE)

## 2020-07-14 LAB — BASIC METABOLIC PANEL
Anion gap: 7 (ref 5–15)
BUN: 9 mg/dL (ref 6–20)
CO2: 25 mmol/L (ref 22–32)
Calcium: 7.9 mg/dL — ABNORMAL LOW (ref 8.9–10.3)
Chloride: 107 mmol/L (ref 98–111)
Creatinine, Ser: 0.83 mg/dL (ref 0.44–1.00)
GFR, Estimated: >60 mL/min (ref >60)
Glucose, Bld: 90 mg/dL (ref 70–99)
Potassium: 3.5 mmol/L (ref 3.5–5.1)
Sodium: 139 mmol/L (ref 135–145)

## 2020-07-14 LAB — HEPATIC FUNCTION PANEL
ALT: 70 U/L — ABNORMAL HIGH (ref 0–44)
AST: 113 U/L — ABNORMAL HIGH (ref 15–41)
Albumin: 2 g/dL — ABNORMAL LOW (ref 3.5–5.0)
Alkaline Phosphatase: 354 U/L — ABNORMAL HIGH (ref 38–126)
Bilirubin, Direct: 0.2 mg/dL (ref 0.0–0.2)
Indirect Bilirubin: 0.5 mg/dL (ref 0.3–0.9)
Total Bilirubin: 0.7 mg/dL (ref 0.3–1.2)
Total Protein: 4.4 g/dL — ABNORMAL LOW (ref 6.5–8.1)

## 2020-07-14 MED ORDER — ONDANSETRON 8 MG PO TBDP: 8.0000 mg | ORAL_TABLET | Freq: Three times a day (TID) | ORAL | 3 refills | Status: DC | PRN

## 2020-07-14 MED ORDER — LOPERAMIDE HCL 2 MG PO CAPS: 2.0000 mg | ORAL_CAPSULE | Freq: Two times a day (BID) | ORAL | Status: DC | PRN

## 2020-07-14 MED ORDER — BOOST / RESOURCE BREEZE PO LIQD CUSTOM: 1.0000 | Freq: Three times a day (TID) | ORAL | Status: DC

## 2020-07-14 MED ORDER — LOPERAMIDE HCL 2 MG PO CAPS: 2.0000 mg | ORAL_CAPSULE | Freq: Three times a day (TID) | ORAL | 0 refills | Status: DC | PRN

## 2020-07-14 MED ORDER — METOCLOPRAMIDE HCL 5 MG/5ML PO SOLN: 10.0000 mg | Freq: Three times a day (TID) | ORAL | 0 refills | Status: DC

## 2020-07-14 MED ORDER — PROSOURCE PLUS PO LIQD: 30.0000 mL | Freq: Two times a day (BID) | ORAL | Status: DC

## 2020-07-14 MED ORDER — PANTOPRAZOLE SODIUM 40 MG PO TBEC: 1.0000 | DELAYED_RELEASE_TABLET | Freq: Two times a day (BID) | ORAL | 2 refills | Status: DC

## 2020-07-14 NOTE — Progress Notes (Signed)
Nsg Discharge Note  Admit Date:  07/11/2020 Discharge date: 07/14/2020   Sharon Lawrence to be D/C'd home  per MD order.  AVS completed.  Copy for chart, and copy for patient signed, and dated. Patient/caregiver able to verbalize understanding.  Discharge Medication: Allergies as of 07/14/2020      Reactions   Bee Venom Anaphylaxis   Erythromycin Anaphylaxis   Cephalexin    Neomycin-bacitracin-polymyxin  [bacitracin-neomycin-polymyxin] Swelling   Sight of application   Neosporin Original [bacitracin-neomycin-polymyxin] Swelling   Swelling at site of application   Prednisone    Sulfa Antibiotics Swelling   Sulfa eye drops caused eyes to swell Sulfa eye drops caused eyes to swell   Tobrex [tobramycin] Swelling   Tramadol Hives   Penicillins Rash   Has patient had a PCN reaction causing immediate rash, facial/tongue/throat swelling, SOB or lightheadedness with hypotension: Yes Has patient had a PCN reaction causing severe rash involving mucus membranes or skin necrosis: No Has patient had a PCN reaction that required hospitalization: No Has patient had a PCN reaction occurring within the last 10 years: No If all of the above answers are "NO", then may proceed with Cephalosporin use.      Medication List    TAKE these medications   aspirin 81 MG chewable tablet Chew 1 tablet (81 mg total) by mouth daily.   baclofen 20 MG tablet Commonly known as: LIORESAL Take by mouth at bedtime.   DULoxetine 20 MG capsule Commonly known as: CYMBALTA TAKE (1) CAPSULE BY MOUTH ONCE DAILY. *TAKE WITH 60MG CAP* What changed: See the new instructions.   DULoxetine 60 MG capsule Commonly known as: CYMBALTA TAKE (1) CAPSULE BY MOUTH ONCE DAILY. What changed: Another medication with the same name was changed. Make sure you understand how and when to take each.   famotidine 40 MG tablet Commonly known as: PEPCID Take 40 mg by mouth at bedtime.   fluticasone 50 MCG/ACT nasal spray Commonly known  as: FLONASE Place 1 spray into both nostrils daily. As needed.   folic acid 1 MG tablet Commonly known as: FOLVITE Take 1 mg by mouth 2 (two) times a day.   lactobacillus acidophilus Tabs tablet Take 1 tablet by mouth daily.   levonorgestrel 20 MCG/24HR IUD Commonly known as: MIRENA by Intrauterine route.   loperamide 2 MG capsule Commonly known as: IMODIUM Take 1 capsule (2 mg total) by mouth 3 (three) times daily as needed for diarrhea or loose stools.   metoCLOPramide 5 MG/5ML solution Commonly known as: REGLAN Take 10 mLs (10 mg total) by mouth 3 (three) times daily before meals.   metoprolol tartrate 25 MG tablet Commonly known as: LOPRESSOR Take 1 tablet (25 mg total) by mouth 2 (two) times daily.   nitroGLYCERIN 0.4 MG/SPRAY spray Commonly known as: NITROLINGUAL Place 2 sprays under the tongue every 5 (five) minutes x 3 doses as needed.   nystatin powder Commonly known as: MYCOSTATIN/NYSTOP Apply topically.   ondansetron 8 MG disintegrating tablet Commonly known as: ZOFRAN-ODT Take 1 tablet (8 mg total) by mouth 3 (three) times daily as needed for nausea.   oxyCODONE 5 MG immediate release tablet Commonly known as: Oxy IR/ROXICODONE Take 1 tablet (5 mg total) by mouth every 6 (six) hours as needed for severe pain. Must last 30 days.   pantoprazole 40 MG tablet Commonly known as: PROTONIX Take 1 tablet (40 mg total) by mouth 2 (two) times daily. What changed: when to take this   potassium chloride SA  20 MEQ tablet Commonly known as: KLOR-CON Take 1 tablet (20 mEq total) by mouth 2 (two) times daily.   pregabalin 150 MG capsule Commonly known as: LYRICA Take 150 mg by mouth in the morning and at bedtime.   promethazine 25 MG tablet Commonly known as: PHENERGAN Take 25 mg by mouth 3 (three) times daily as needed for nausea.   promethazine 25 MG suppository Commonly known as: PHENERGAN Place 1 suppository (25 mg total) rectally every 6 (six) hours as  needed for nausea or vomiting.   rizatriptan 10 MG tablet Commonly known as: MAXALT Take 10 mg by mouth as needed.   torsemide 20 MG tablet Commonly known as: DEMADEX 20 mg once. As needed.   VITAMIN B-12 IJ Inject as directed every 30 (thirty) days.   Vitamin D (Ergocalciferol) 1.25 MG (50000 UNIT) Caps capsule Commonly known as: DRISDOL Take 50,000 Units by mouth every 7 (seven) days. Wednesday       Discharge Assessment: Vitals:   07/13/20 2121 07/14/20 0435  BP: 131/84 131/87  Pulse: 82 78  Resp: 14 15  Temp: 98.2 F (36.8 C) (!) 97.4 F (36.3 C)  SpO2: 99% 100%   Skin clean, dry and intact without evidence of skin break down, no evidence of skin tears noted. IV catheter discontinued intact. Site without signs and symptoms of complications - no redness or edema noted at insertion site, patient denies c/o pain - only slight tenderness at site.  Dressing with slight pressure applied.  D/c Instructions-Education: Discharge instructions given to patient/family with verbalized understanding. D/c education completed with patient/family including follow up instructions, medication list, d/c activities limitations if indicated, with other d/c instructions as indicated by MD - patient able to verbalize understanding, all questions fully answered. Patient instructed to return to ED, call 911, or call MD for any changes in condition.  Patient escorted via [place], and D/C home via private auto.  [place] 04/07/5595 4:16 PM

## 2020-07-14 NOTE — Progress Notes (Signed)
  Subjective: Diarrhea is slowing down. 5 BMs yesterday. None overnight. 1 Bm this morning. Still liquid. No brbpr or melena. No abdominal pain. No nausea or vomiting. Tolerating her diet well. Waiting for dietician to talk with her.   No side effects to Reglan.   Objective: Vital signs in last 24 hours: Temp:  [97.4 F (36.3 C)-98.2 F (36.8 C)] 97.4 F (36.3 C) (05/13 0435) Pulse Rate:  [78-82] 78 (05/13 0435) Resp:  [14-16] 15 (05/13 0435) BP: (130-131)/(84-87) 131/87 (05/13 0435) SpO2:  [99 %-100 %] 100 % (05/13 0435) Last BM Date: 07/14/20 General:   Alert and oriented, pleasant Head:  Normocephalic and atraumatic. Eyes:  No icterus, sclera clear. Conjuctiva pink.  Abdomen:  Bowel sounds present. Abdomen is obese but soft and non-tender to palpation.  No rebound or guarding. No masses appreciated  Msk:  Symmetrical without gross deformities. Normal posture. Extremities:  With 1-3+ bilateral edema below the knees, 1+ edema up to the hips. Neurologic:  Alert and  oriented x4;  grossly normal neurologically. Skin:  Warm and dry, intact without significant lesions.  Psych: Normal mood and affect.  Intake/Output from previous day: 05/12 0701 - 05/13 0700 In: 1200 [P.O.:720] Out: -  Intake/Output this shift: Total I/O In: 240 [P.O.:240] Out: -   Lab Results: Recent Labs    07/11/20 1608 07/12/20 0552 07/13/20 0638  WBC 9.6 7.1 8.3  HGB 13.8 12.0 13.4  HCT 40.7 36.0 40.7  PLT 529* 427* 419*   BMET Recent Labs    07/12/20 0552 07/13/20 0638 07/14/20 0604  NA 140 139 139  K 3.5 2.9* 3.5  CL 110 111 107  CO2 23 20* 25  GLUCOSE 88 94 90  BUN 7 8 9  CREATININE 0.74 0.77 0.83  CALCIUM 7.6* 7.7* 7.9*   LFT Recent Labs    07/11/20 1608 07/12/20 0552 07/13/20 0638  PROT 5.4* 4.3* 4.8*  ALBUMIN 2.6* 2.0* 2.2*  2.3*  AST 122* 101* 107*  ALT 85* 67* 76*  ALKPHOS 509* 379* 404*  BILITOT 1.1 1.0 0.9  BILIDIR  --   --  0.3*  IBILI  --   --  0.6    PT/INR Recent Labs    07/11/20 1608  LABPROT 13.6  INR 1.0   Hepatitis Panel Recent Labs    07/12/20 0552  HEPBSAG NON REACTIVE  HCVAB NON REACTIVE  HEPAIGM NON REACTIVE  HEPBIGM NON REACTIVE   Assessment: 54-year-old with history of celiac disease, B12 deficiency on parenteral replacement therapy, CAD with STEMI in July 2019, obesity, GERD, history of colonic polyps in July 2019 but not removed because of need for continued dual antiplatelet therapy presenting with nausea/vomiting/diarrhea/upper abdominal soreness worsening since February.  Chronic diarrhea: Since February.  Up to 10 stools per day.  No melena or rectal bleeding.  C. difficile negative this admission. GI pathogen panel pending, but negative last month. History of celiac disease with active disease on EGD this admission. Suspect non-compliance with gluten free diet/cross-contaminating with cooking at home. Consult to dietitian was placed yesterday and is pending. Clinically, she has had some improvement with imodium BID started yesterday. 5 BMs yesterday, no nocturnal BMs.  1 BM thus far today by 12 PM.  Denies BRBPR, melena, or abdominal pain.  Chronic nausea and vomiting: Possibly multifactorial in the setting of active celiac disease, compounded by narcotic withdrawal when she could not keep down her p.o. pain medications, possible gastroparesis. EGD this admission with congested and erythematous mucosa   in the gastric antrum biopsied, mucosal changes in the duodenum biopsied.  Gastric biopsy with chronic mild gastritis and reactive gastropathy, negative for H. pylori.  Duodenal biopsy with chronic duodenitis with intraepithelial lymphocytosis and moderate to focally marked villous blunting consistent with celiac disease. Clinically, symptoms have resolved with scheduled Reglan which she is tolerating well and PPI BID. Tolerating a gluten-free diet. Dietician consult pending for gluten free diet education.   Elevated  LFTs: Dating back to 2019.  Increasing more recently.  Known fatty liver.  She was on a statin but this has been recently discontinued.  CT abdomen pelvis recently revealed  pronounced fatty liver but no cirrhosis.  U/S with ?subtle nodularity along the liver surface particularly along the left hepatic lobe, CBD 5 mm, gallbladder surgically absent.  Transaminases improving over the past 2 to 3 weeks. AST peaked at 209, ALT peaked at 118, alk phos peaked at 527 on 4/24.  Down to AST 107, ALT 76, alk phos 404 yesterday. T bili normal. AMA negative.  Acute hepatitis panel negative.  Overall, likely multifactorial with poorly controlled celiac disease contributing.  We will continue to follow.  History of colonic adenomatous colon:  Multiple colon polyps noted at time of colonoscopy in 2019 done for rectal bleeding, 5 small polyps which were not removed because of dual antiplatelet therapy cannot be interrupted (STEMI few days prior to admission).  She is overdue for repeat colonoscopy with polypectomy.  This can be performed outpatient.  Plan: 1.  Repeat HFP.  2.  Gluten-free diet. 3.  Dietitian consult pending. 4.  Continue PPI twice daily. 5.  Continue Reglan 3 times daily before meals. 6.  Continue Imodium up to 4 mg per day PRN.  7.  Follow-up GI pathogen panel.  8.  Outpatient colonoscopy.  9.  Anticipate discharge within the in the next 24 hours.     LOS: 2 days    07/14/2020, 11:38 AM    , PA-C [facility] 

## 2020-07-14 NOTE — Discharge Summary (Signed)
Physician Discharge Summary  Sharon Lawrence TGG:[id number] DOB: 1965/03/20 DOA: 07/11/2020  PCP: Vidal Schwalbe, MD  Admit date: 07/11/2020 Discharge date: 07/14/2020  Admitted From: Home Disposition: Home  Recommendations for Outpatient Follow-up:  1. Follow up with PCP in 1-2 weeks 2. Please obtain BMP/CBC in one week 3. Follow-up with GI, Dr. Laural Golden in 3 to 4 weeks  Discharge Condition: Stable CODE STATUS: Full code Diet recommendation: Gluten-free diet  Brief/Interim Summary: 55 year old female with a history of celiac disease, admitted with intractable nausea, vomiting and diarrhea.  She was seen by GI and underwent EGD that showed severe uncontrolled celiac disease.  Patient reports being noncompliant with diet.  She was started on a gluten-free diet and supportive management with Imodium, Reglan.  Her stool studies were found to be negative.  Patient clinically improved, her potassium was replaced and overall symptoms resolved.  She will follow-up with GI in the next 3 to 4 weeks for colonoscopy.  Discharge Diagnoses:  Active Problems:   Adult celiac disease   Intractable nausea and vomiting   Nausea and vomiting   Transaminitis  Intractable nausea and vomiting -overall symptoms appear to be improving -GI following -EGD shows severe, uncontrolled celiac disease -Started on gluten-free diet -Overall symptoms are better -She was also started on metoclopramide for component of gastroparesis  Hypokalemia -related to low GI losses -Replaced -Magnesium 1.8  Diarrhea -somewhat chronic -Felt to be likely related to uncontrolled celiac disease -GI pathogen panel and C. difficile antigen negative -continue supportive management -Started on Imodium -Overall improved relatively  Abnormal LFTs -history of fatty liver -improving with IV fluids -hold statin -RUQ ultrasound does not show any obstructive process -viral hepatitis panel negative -mitochondrial antibodies  negative -Follow-up with GI  Hypoalbuminemia -likely related to poor po intake -She does have some protein in her urine based on urinalysis -Although I suspect the main cause of her hyponatremia has been poor p.o. intake here lately -If hypoalbuminemia persist despite adequate p.o. intake, could pursue further work-up with 24-hour urine protein  HTN -Resume metoprolol on discharge  Discharge Instructions  Discharge Instructions    Diet - low sodium heart healthy   Complete by: As directed    Increase activity slowly   Complete by: As directed      Allergies as of 07/14/2020      Reactions   Bee Venom Anaphylaxis   Erythromycin Anaphylaxis   Cephalexin    Neomycin-bacitracin-polymyxin  [bacitracin-neomycin-polymyxin] Swelling   Sight of application   Neosporin Original [bacitracin-neomycin-polymyxin] Swelling   Swelling at site of application   Prednisone    Sulfa Antibiotics Swelling   Sulfa eye drops caused eyes to swell Sulfa eye drops caused eyes to swell   Tobrex [tobramycin] Swelling   Tramadol Hives   Penicillins Rash   Has patient had a PCN reaction causing immediate rash, facial/tongue/throat swelling, SOB or lightheadedness with hypotension: Yes Has patient had a PCN reaction causing severe rash involving mucus membranes or skin necrosis: No Has patient had a PCN reaction that required hospitalization: No Has patient had a PCN reaction occurring within the last 10 years: No If all of the above answers are "NO", then may proceed with Cephalosporin use.      Medication List    TAKE these medications   aspirin 81 MG chewable tablet Chew 1 tablet (81 mg total) by mouth daily.   baclofen 20 MG tablet Commonly known as: LIORESAL Take by mouth at bedtime.   DULoxetine 20 MG capsule Commonly  known as: CYMBALTA TAKE (1) CAPSULE BY MOUTH ONCE DAILY. *TAKE WITH 60MG CAP* What changed: See the new instructions.   DULoxetine 60 MG capsule Commonly known as:  CYMBALTA TAKE (1) CAPSULE BY MOUTH ONCE DAILY. What changed: Another medication with the same name was changed. Make sure you understand how and when to take each.   famotidine 40 MG tablet Commonly known as: PEPCID Take 40 mg by mouth at bedtime.   fluticasone 50 MCG/ACT nasal spray Commonly known as: FLONASE Place 1 spray into both nostrils daily. As needed.   folic acid 1 MG tablet Commonly known as: FOLVITE Take 1 mg by mouth 2 (two) times a day.   lactobacillus acidophilus Tabs tablet Take 1 tablet by mouth daily.   levonorgestrel 20 MCG/24HR IUD Commonly known as: MIRENA by Intrauterine route.   loperamide 2 MG capsule Commonly known as: IMODIUM Take 1 capsule (2 mg total) by mouth 3 (three) times daily as needed for diarrhea or loose stools.   metoCLOPramide 5 MG/5ML solution Commonly known as: REGLAN Take 10 mLs (10 mg total) by mouth 3 (three) times daily before meals.   metoprolol tartrate 25 MG tablet Commonly known as: LOPRESSOR Take 1 tablet (25 mg total) by mouth 2 (two) times daily.   nitroGLYCERIN 0.4 MG/SPRAY spray Commonly known as: NITROLINGUAL Place 2 sprays under the tongue every 5 (five) minutes x 3 doses as needed.   nystatin powder Commonly known as: MYCOSTATIN/NYSTOP Apply topically.   ondansetron 8 MG disintegrating tablet Commonly known as: ZOFRAN-ODT Take 1 tablet (8 mg total) by mouth 3 (three) times daily as needed for nausea.   oxyCODONE 5 MG immediate release tablet Commonly known as: Oxy IR/ROXICODONE Take 1 tablet (5 mg total) by mouth every 6 (six) hours as needed for severe pain. Must last 30 days.   pantoprazole 40 MG tablet Commonly known as: PROTONIX Take 1 tablet (40 mg total) by mouth 2 (two) times daily. What changed: when to take this   potassium chloride SA 20 MEQ tablet Commonly known as: KLOR-CON Take 1 tablet (20 mEq total) by mouth 2 (two) times daily.   pregabalin 150 MG capsule Commonly known as:  LYRICA Take 150 mg by mouth in the morning and at bedtime.   promethazine 25 MG tablet Commonly known as: PHENERGAN Take 25 mg by mouth 3 (three) times daily as needed for nausea.   promethazine 25 MG suppository Commonly known as: PHENERGAN Place 1 suppository (25 mg total) rectally every 6 (six) hours as needed for nausea or vomiting.   rizatriptan 10 MG tablet Commonly known as: MAXALT Take 10 mg by mouth as needed.   torsemide 20 MG tablet Commonly known as: DEMADEX 20 mg once. As needed.   VITAMIN B-12 IJ Inject as directed every 30 (thirty) days.   Vitamin D (Ergocalciferol) 1.25 MG (50000 UNIT) Caps capsule Commonly known as: DRISDOL Take 50,000 Units by mouth every 7 (seven) days. Wednesday       Follow-up Information    Rehman, Mechele Dawley, MD. Schedule an appointment as soon as possible for a visit in 4 week(s).   Specialty: Gastroenterology Contact information: [address], [address] [place] [address] [phone]              Allergies  Allergen Reactions  . Bee Venom Anaphylaxis  . Erythromycin Anaphylaxis  . Cephalexin   . Neomycin-Bacitracin-Polymyxin  [Bacitracin-Neomycin-Polymyxin] Swelling    Sight of application  . Neosporin Original [Bacitracin-Neomycin-Polymyxin] Swelling  Swelling at site of application  . Prednisone   . Sulfa Antibiotics Swelling    Sulfa eye drops caused eyes to swell Sulfa eye drops caused eyes to swell  . Tobrex [Tobramycin] Swelling  . Tramadol Hives  . Penicillins Rash    Has patient had a PCN reaction causing immediate rash, facial/tongue/throat swelling, SOB or lightheadedness with hypotension: Yes Has patient had a PCN reaction causing severe rash involving mucus membranes or skin necrosis: No Has patient had a PCN reaction that required hospitalization: No Has patient had a PCN reaction occurring within the last 10 years: No If all of the above answers are "NO", then may proceed with Cephalosporin  use.     Consultations:  Gastroenterology   Procedures/Studies: DG Chest 2 View  Result Date: 07/09/2020 CLINICAL DATA:  Nausea, vomiting and diarrhea. EXAM: CHEST - 2 VIEW COMPARISON:  June 06, 2020 FINDINGS: The heart size and mediastinal contours are within normal limits. Both lungs are clear. Radiopaque surgical clips are seen within the right upper quadrant. The visualized skeletal structures are unremarkable. IMPRESSION: No active cardiopulmonary disease. Electronically Signed   By: Virgina Norfolk M.D.   On: 07/09/2020 18:29   CT ABDOMEN PELVIS W CONTRAST  Result Date: 06/25/2020 CLINICAL DATA:  Nausea, vomiting, diarrhea approximately 8 weeks. EXAM: CT ABDOMEN AND PELVIS WITH CONTRAST TECHNIQUE: Multidetector CT imaging of the abdomen and pelvis was performed using the standard protocol following bolus administration of intravenous contrast. CONTRAST:  189m OMNIPAQUE IOHEXOL 300 MG/ML  SOLN COMPARISON:  09/25/2017 FINDINGS: Lower Chest: No acute findings. Hepatobiliary: No hepatic masses identified. Severe diffuse hepatic steatosis again noted. Prior cholecystectomy. No evidence of biliary obstruction. Pancreas:  No mass or inflammatory changes. Spleen: Within normal limits in size and appearance. Adrenals/Urinary Tract: Stable small bilateral adrenal masses, consistent with benign adenomas. No renal masses identified. No evidence of ureteral calculi or hydronephrosis. Stomach/Bowel: No evidence of obstruction, inflammatory process or abnormal fluid collections. Normal appendix visualized. Vascular/Lymphatic: No pathologically enlarged lymph nodes. No acute vascular findings. Reproductive: IUD seen in appropriate position. No mass or other significant abnormality. Other:  None. Musculoskeletal:  No suspicious bone lesions identified. IMPRESSION: No acute findings within the abdomen or pelvis. Stable severe hepatic steatosis. Stable benign bilateral adrenal adenomas. Electronically Signed    By: JMarlaine HindM.D.   On: 06/25/2020 15:02   NM Myocar Multi W/Spect W/Wall Motion / EF  Result Date: 06/27/2020  There was no ST segment deviation noted during stress.  The study is normal. There are no perfusion defects consistent with prior infarct or current ischemia  This is a low risk study.  The left ventricular ejection fraction is hyperdynamic (>65%).    UKoreaAbdomen Limited RUQ (LIVER/GB)  Result Date: 07/12/2020 CLINICAL DATA:  Transaminitis on recent labs. EXAM: ULTRASOUND ABDOMEN LIMITED RIGHT UPPER QUADRANT COMPARISON:  CT abdomen 06/25/2020 FINDINGS: Gallbladder: Surgically absent Common bile duct: Diameter: 5 mm Liver: No focal lesion identified. Increased hepatic parenchymal echogenicity with subtle nodularity along the liver surface particularly along the left hepatic lobe. Portal vein is patent on color Doppler imaging with normal direction of blood flow towards the liver. Other: None. IMPRESSION: 1. Prior cholecystectomy. 2. Increased hepatic echogenicity with subtle nodularity along the liver surface. Overall appearance is concerning for of hepatic steatosis with early cirrhosis. Electronically Signed   By: HKathreen Devoid  On: 07/12/2020 08:55       Subjective: Feels better.  No vomiting.  Diarrhea is better.  Wants to  go home.  Discharge Exam: Vitals:   07/13/20 0435 07/13/20 1423 07/13/20 2121 07/14/20 0435  BP: 124/84 130/86 131/84 131/87  Pulse: 85 79 82 78  Resp: 16 16 14 15   Temp: 98.1 F (36.7 C) 97.8 F (36.6 C) 98.2 F (36.8 C) (!) 97.4 F (36.3 C)  TempSrc: Oral Oral Oral Oral  SpO2: 98% 100% 99% 100%  Weight:      Height:        General: Pt is alert, awake, not in acute distress Cardiovascular: RRR, S1/S2 +, no rubs, no gallops Respiratory: CTA bilaterally, no wheezing, no rhonchi Abdominal: Soft, NT, ND, bowel sounds + Extremities: no edema, no cyanosis    The results of significant diagnostics from this hospitalization (including imaging,  microbiology, ancillary and laboratory) are listed below for reference.     Microbiology: Recent Results (from the past 240 hour(s))  Resp Panel by RT-PCR (Flu A&B, Covid) Nasopharyngeal Swab     Status: None   Collection Time: 07/11/20  4:45 PM   Specimen: Nasopharyngeal Swab; Nasopharyngeal(NP) swabs in vial transport medium  Result Value Ref Range Status   SARS Coronavirus 2 by RT PCR NEGATIVE NEGATIVE Final    Comment: (NOTE) SARS-CoV-2 target nucleic acids are NOT DETECTED.  The SARS-CoV-2 RNA is generally detectable in upper respiratory specimens during the acute phase of infection. The lowest concentration of SARS-CoV-2 viral copies this assay can detect is 138 copies/mL. A negative result does not preclude SARS-Cov-2 infection and should not be used as the sole basis for treatment or other patient management decisions. A negative result may occur with  improper specimen collection/handling, submission of specimen other than nasopharyngeal swab, presence of viral mutation(s) within the areas targeted by this assay, and inadequate number of viral copies(<138 copies/mL). A negative result must be combined with clinical observations, patient history, and epidemiological information. The expected result is Negative.  Fact Sheet for Patients:  EntrepreneurPulse.com.au  Fact Sheet for Healthcare Providers:  IncredibleEmployment.be  This test is no t yet approved or cleared by the [place] FDA and  has been authorized for detection and/or diagnosis of SARS-CoV-2 by FDA under an Emergency Use Authorization (EUA). This EUA will remain  in effect (meaning this test can be used) for the duration of the COVID-19 declaration under Section 564(b)(1) of the Act, 21 U.S.C.section 360bbb-3(b)(1), unless the authorization is terminated  or revoked sooner.       Influenza A by PCR NEGATIVE NEGATIVE Final   Influenza B by PCR NEGATIVE NEGATIVE  Final    Comment: (NOTE) The Xpert Xpress SARS-CoV-2/FLU/RSV plus assay is intended as an aid in the diagnosis of influenza from Nasopharyngeal swab specimens and should not be used as a sole basis for treatment. Nasal washings and aspirates are unacceptable for Xpert Xpress SARS-CoV-2/FLU/RSV testing.  Fact Sheet for Patients: EntrepreneurPulse.com.au  Fact Sheet for Healthcare Providers: IncredibleEmployment.be  This test is not yet approved or cleared by the [place] FDA and has been authorized for detection and/or diagnosis of SARS-CoV-2 by FDA under an Emergency Use Authorization (EUA). This EUA will remain in effect (meaning this test can be used) for the duration of the COVID-19 declaration under Section 564(b)(1) of the Act, 21 U.S.C. section 360bbb-3(b)(1), unless the authorization is terminated or revoked.  Performed at [facility], [address], [place], [place] [address]   C Difficile Quick Screen w PCR reflex     Status: None   Collection Time: 07/11/20  9:00 PM  Result Value  Ref Range Status   C Diff antigen NEGATIVE NEGATIVE Final   C Diff toxin NEGATIVE NEGATIVE Final   C Diff interpretation No C. difficile detected.  Final    Comment: Performed at [facility], [address] [place], [place] [address]  Gastrointestinal Panel by PCR , Stool     Status: None   Collection Time: 07/13/20 10:10 AM   Specimen: Stool  Result Value Ref Range Status   Campylobacter species NOT DETECTED NOT DETECTED Final   Plesimonas shigelloides NOT DETECTED NOT DETECTED Final   Salmonella species NOT DETECTED NOT DETECTED Final   Yersinia enterocolitica NOT DETECTED NOT DETECTED Final   Vibrio species NOT DETECTED NOT DETECTED Final   Vibrio cholerae NOT DETECTED NOT DETECTED Final   Enteroaggregative E coli (EAEC) NOT DETECTED NOT DETECTED Final   Enteropathogenic E coli (EPEC) NOT DETECTED NOT DETECTED Final   Enterotoxigenic E coli  (ETEC) NOT DETECTED NOT DETECTED Final   Shiga like toxin producing E coli (STEC) NOT DETECTED NOT DETECTED Final   Shigella/Enteroinvasive E coli (EIEC) NOT DETECTED NOT DETECTED Final   Cryptosporidium NOT DETECTED NOT DETECTED Final   Cyclospora cayetanensis NOT DETECTED NOT DETECTED Final   Entamoeba histolytica NOT DETECTED NOT DETECTED Final   Giardia lamblia NOT DETECTED NOT DETECTED Final   Adenovirus F40/41 NOT DETECTED NOT DETECTED Final   Astrovirus NOT DETECTED NOT DETECTED Final   Norovirus GI/GII NOT DETECTED NOT DETECTED Final   Rotavirus A NOT DETECTED NOT DETECTED Final   Sapovirus (I, II, IV, and V) NOT DETECTED NOT DETECTED Final    Comment: Performed at [facility], [address] [place], [place] [address]     Labs: BNP (last 3 results) No results for input(s): BNP in the last 8760 hours. Basic Metabolic Panel: Recent Labs  Lab 07/09/20 1750 07/11/20 1608 07/11/20 1827 07/12/20 0552 07/13/20 0638 07/14/20 0604  NA 137 137  --  140 139 139  K 2.8* 2.2*  --  3.5 2.9* 3.5  CL 108 104  --  110 111 107  CO2 20* 24  --  23 20* 25  GLUCOSE 103* 89  --  88 94 90  BUN 6 8  --  7 8 9   CREATININE 0.92 0.78  --  0.74 0.77 0.83  CALCIUM 7.9* 7.8*  --  7.6* 7.7* 7.9*  MG 1.9  --  1.8 1.8 1.8  --   PHOS  --   --   --   --  3.6  --    Liver Function Tests: Recent Labs  Lab 07/09/20 1750 07/11/20 1608 07/12/20 0552 07/13/20 0638 07/14/20 0604  AST 116* 122* 101* 107* 113*  ALT 82* 85* 67* 76* 70*  ALKPHOS 457* 509* 379* 404* 354*  BILITOT 1.1 1.1 1.0 0.9 0.7  PROT 5.0* 5.4* 4.3* 4.8* 4.4*  ALBUMIN 2.2* 2.6* 2.0* 2.2*  2.3* 2.0*   Recent Labs  Lab 07/09/20 1750 07/11/20 1838  LIPASE 19 22   No results for input(s): AMMONIA in the last 168 hours. CBC: Recent Labs  Lab 07/09/20 1750 07/11/20 1608 07/12/20 0552 07/13/20 0638  WBC 11.0* 9.6 7.1 8.3  NEUTROABS  --  4.7 3.7  --   HGB 14.5 13.8 12.0 13.4  HCT 44.4 40.7 36.0 40.7  MCV  77.1* 76.9* 77.4* 78.3*  PLT 538* 529* 427* 419*   Cardiac Enzymes: No results for input(s): CKTOTAL, CKMB, CKMBINDEX, TROPONINI in the last 168 hours. BNP: Invalid input(s): POCBNP CBG:  No results for input(s): GLUCAP in the last 168 hours. D-Dimer No results for input(s): DDIMER in the last 72 hours. Hgb A1c No results for input(s): HGBA1C in the last 72 hours. Lipid Profile No results for input(s): CHOL, HDL, LDLCALC, TRIG, CHOLHDL, LDLDIRECT in the last 72 hours. Thyroid function studies No results for input(s): TSH, T4TOTAL, T3FREE, THYROIDAB in the last 72 hours.  Invalid input(s): FREET3 Anemia work up No results for input(s): VITAMINB12, FOLATE, FERRITIN, TIBC, IRON, RETICCTPCT in the last 72 hours. Urinalysis    Component Value Date/Time   COLORURINE AMBER (A) 07/13/2020 0720   APPEARANCEUR HAZY (A) 07/13/2020 0720   LABSPEC 1.018 07/13/2020 0720   PHURINE 6.0 07/13/2020 0720   GLUCOSEU NEGATIVE 07/13/2020 0720   HGBUR NEGATIVE 07/13/2020 0720   BILIRUBINUR NEGATIVE 07/13/2020 0720   KETONESUR NEGATIVE 07/13/2020 0720   PROTEINUR 30 (A) 07/13/2020 0720   NITRITE NEGATIVE 07/13/2020 0720   LEUKOCYTESUR TRACE (A) 07/13/2020 0720   Sepsis Labs Invalid input(s): PROCALCITONIN,  WBC,  LACTICIDVEN Microbiology Recent Results (from the past 240 hour(s))  Resp Panel by RT-PCR (Flu A&B, Covid) Nasopharyngeal Swab     Status: None   Collection Time: 07/11/20  4:45 PM   Specimen: Nasopharyngeal Swab; Nasopharyngeal(NP) swabs in vial transport medium  Result Value Ref Range Status   SARS Coronavirus 2 by RT PCR NEGATIVE NEGATIVE Final    Comment: (NOTE) SARS-CoV-2 target nucleic acids are NOT DETECTED.  The SARS-CoV-2 RNA is generally detectable in upper respiratory specimens during the acute phase of infection. The lowest concentration of SARS-CoV-2 viral copies this assay can detect is 138 copies/mL. A negative result does not preclude SARS-Cov-2 infection and  should not be used as the sole basis for treatment or other patient management decisions. A negative result may occur with  improper specimen collection/handling, submission of specimen other than nasopharyngeal swab, presence of viral mutation(s) within the areas targeted by this assay, and inadequate number of viral copies(<138 copies/mL). A negative result must be combined with clinical observations, patient history, and epidemiological information. The expected result is Negative.  Fact Sheet for Patients:  EntrepreneurPulse.com.au  Fact Sheet for Healthcare Providers:  IncredibleEmployment.be  This test is no t yet approved or cleared by the [place] FDA and  has been authorized for detection and/or diagnosis of SARS-CoV-2 by FDA under an Emergency Use Authorization (EUA). This EUA will remain  in effect (meaning this test can be used) for the duration of the COVID-19 declaration under Section 564(b)(1) of the Act, 21 U.S.C.section 360bbb-3(b)(1), unless the authorization is terminated  or revoked sooner.       Influenza A by PCR NEGATIVE NEGATIVE Final   Influenza B by PCR NEGATIVE NEGATIVE Final    Comment: (NOTE) The Xpert Xpress SARS-CoV-2/FLU/RSV plus assay is intended as an aid in the diagnosis of influenza from Nasopharyngeal swab specimens and should not be used as a sole basis for treatment. Nasal washings and aspirates are unacceptable for Xpert Xpress SARS-CoV-2/FLU/RSV testing.  Fact Sheet for Patients: EntrepreneurPulse.com.au  Fact Sheet for Healthcare Providers: IncredibleEmployment.be  This test is not yet approved or cleared by the [place] FDA and has been authorized for detection and/or diagnosis of SARS-CoV-2 by FDA under an Emergency Use Authorization (EUA). This EUA will remain in effect (meaning this test can be used) for the duration of the COVID-19 declaration  under Section 564(b)(1) of the Act, 21 U.S.C. section 360bbb-3(b)(1), unless the authorization is terminated or revoked.  Performed at [facility], [address] [place], [place] [address]   C Difficile Quick Screen w PCR reflex     Status: None   Collection Time: 07/11/20  9:00 PM  Result Value Ref Range Status   C Diff antigen NEGATIVE NEGATIVE Final   C Diff toxin NEGATIVE NEGATIVE Final   C Diff interpretation No C. difficile detected.  Final    Comment: Performed at [facility], [address] [place], [place] [address]  Gastrointestinal Panel by PCR , Stool     Status: None   Collection Time: 07/13/20 10:10 AM   Specimen: Stool  Result Value Ref Range Status   Campylobacter species NOT DETECTED NOT DETECTED Final   Plesimonas shigelloides NOT DETECTED NOT DETECTED Final   Salmonella species NOT DETECTED NOT DETECTED Final   Yersinia enterocolitica NOT DETECTED NOT DETECTED Final   Vibrio species NOT DETECTED NOT DETECTED Final   Vibrio cholerae NOT DETECTED NOT DETECTED Final   Enteroaggregative E coli (EAEC) NOT DETECTED NOT DETECTED Final   Enteropathogenic E coli (EPEC) NOT DETECTED NOT DETECTED Final   Enterotoxigenic E coli (ETEC) NOT DETECTED NOT DETECTED Final   Shiga like toxin producing E coli (STEC) NOT DETECTED NOT DETECTED Final   Shigella/Enteroinvasive E coli (EIEC) NOT DETECTED NOT DETECTED Final   Cryptosporidium NOT DETECTED NOT DETECTED Final   Cyclospora cayetanensis NOT DETECTED NOT DETECTED Final   Entamoeba histolytica NOT DETECTED NOT DETECTED Final   Giardia lamblia NOT DETECTED NOT DETECTED Final   Adenovirus F40/41 NOT DETECTED NOT DETECTED Final   Astrovirus NOT DETECTED NOT DETECTED Final   Norovirus GI/GII NOT DETECTED NOT DETECTED Final   Rotavirus A NOT DETECTED NOT DETECTED Final   Sapovirus (I, II, IV, and V) NOT DETECTED NOT DETECTED Final    Comment: Performed at [facility], [address], [place], [place] [address]      Time coordinating discharge: 19mns  SIGNED:   JKathie Dike MD  [facility] 07/14/2020, 6:40 PM   If 7PM-7AM, please contact night-coverage [email or web address]

## 2020-07-14 NOTE — Progress Notes (Signed)
Initial Nutrition Assessment  DOCUMENTATION CODES:   Morbid obesity  INTERVENTION:  Glutenfree Nutrition Education   NUTRITION DIAGNOSIS:   Inadequate oral intake related to nausea,vomiting (prior to admission) as evidenced by per patient/family report.   GOAL:  Patient will meet greater than or equal to 90% of their needs  MONITOR:  PO intake,Weight trends,Labs,Skin  REASON FOR ASSESSMENT:   Consult,Malnutrition Screening Tool Diet education  ASSESSMENT: Patient is a morbidly obese 55 yo female with hx of celiac disease (2009), chronic diarrhea, B-12 deficiency. Presents with nausea and vomiting and hypokalemia. EGD 5/11 with biopsy.   Gluten free diet provided and meal intake ~50%. Patient provided handout -Gluten free diet which she endorses compliance overall but feels maybe she has not been as diligent with preventing cross contamination.    Patient weight ranging between 103-107 kg the past 6 months except for 2 outlier weights of 95.3 and 90.7 kg.  Medications: Pepcid, Imodium, Reglan, Lyrica  Labs:Abnormal LFT Alk Phos 354 (H), Alb. 2.0 (L) BMP Latest Ref Rng & Units 07/14/2020 07/13/2020 07/12/2020  Glucose 70 - 99 mg/dL 90 94 88  BUN 6 - 20 mg/dL _0 Creatinine 0.44 - 1.00 mg/dL 0.83 0.77 0.74  Sodium 135 - 145 mmol/L 139 139 140  Potassium 3.5 - 5.1 mmol/L 3.5 2.9(L) 3.5  Chloride 98 - 111 mmol/L 107 111 110  CO2 22 - 32 mmol/L 25 20(L) 23  Calcium 8.9 - 10.3 mg/dL 7.9(L) 7.7(L) 7.6(L)     NUTRITION - FOCUSED PHYSICAL EXAM:  Flowsheet Row Most Recent Value  Orbital Region No depletion  Upper Arm Region No depletion  Buccal Region No depletion  Temple Region No depletion  Clavicle Bone Region No depletion  Clavicle and Acromion Bone Region No depletion  Dorsal Hand No depletion  Patellar Region No depletion  Anterior Thigh Region No depletion  Posterior Calf Region No depletion  Edema (RD Assessment) Mild  Hair Reviewed  Eyes Reviewed  Mouth  Reviewed  Skin Reviewed  Nails Reviewed     Diet Order:   Diet Order            Diet gluten free Room service appropriate? Yes; Fluid consistency: Thin  Diet effective now                 EDUCATION NEEDS:  Education needs have been addressed  Skin:  Skin Assessment: Reviewed RN Assessment  Last BM:  5/13  Height:   Ht Readings from Last 1 Encounters:  07/12/20 5' (1.524 m)    Weight:   Wt Readings from Last 1 Encounters:  07/12/20 103 kg    Ideal Body Weight:   45 kg   BMI:  Body mass index is 44.33 kg/m.  Estimated Nutritional Needs: [place] Jeor  Kcal:  1800-1900  Protein:  90 gr  Fluid:  1.8-1.9 liters daily   Colman Cater MS,RD,CSG,LDN Contact: [email or web address]

## 2020-07-24 ENCOUNTER — Other Ambulatory Visit (INDEPENDENT_AMBULATORY_CARE_PROVIDER_SITE_OTHER): Payer: Self-pay | Admitting: Gastroenterology

## 2020-07-24 ENCOUNTER — Telehealth (INDEPENDENT_AMBULATORY_CARE_PROVIDER_SITE_OTHER): Payer: Self-pay | Admitting: *Deleted

## 2020-07-24 MED ORDER — METOCLOPRAMIDE HCL 5 MG/5ML PO SOLN: 10.0000 mg | Freq: Three times a day (TID) | ORAL | 0 refills | Status: DC

## 2020-07-24 NOTE — Telephone Encounter (Signed)
Please note that I have let Dr.Rehman know that  he was off as far as the office visit is concerned.

## 2020-07-24 NOTE — Telephone Encounter (Signed)
Dr. Laural Golden is requesting that the patient be set up next week for a Gastric Emptying Study. Patient is to be off of her Reglan for 24 hours.  Dr. Laural Golden then ask that the patient be given an appointment with him on Thursday June 9th.  Patient will need a call about both.

## 2020-08-16 ENCOUNTER — Encounter (INDEPENDENT_AMBULATORY_CARE_PROVIDER_SITE_OTHER): Payer: Self-pay

## 2020-08-17 ENCOUNTER — Encounter (INDEPENDENT_AMBULATORY_CARE_PROVIDER_SITE_OTHER): Payer: Self-pay | Admitting: Internal Medicine

## 2020-08-17 ENCOUNTER — Other Ambulatory Visit: Payer: Self-pay

## 2020-08-17 ENCOUNTER — Ambulatory Visit (INDEPENDENT_AMBULATORY_CARE_PROVIDER_SITE_OTHER): Payer: Self-pay | Admitting: Internal Medicine

## 2020-08-17 VITALS — BP 122/78 | HR 101 | Temp 98.1°F | Ht 60.0 in | Wt 211.3 lb

## 2020-08-17 DIAGNOSIS — K219 Gastro-esophageal reflux disease without esophagitis: Secondary | ICD-10-CM

## 2020-08-17 DIAGNOSIS — K900 Celiac disease: Secondary | ICD-10-CM

## 2020-08-17 DIAGNOSIS — K760 Fatty (change of) liver, not elsewhere classified: Secondary | ICD-10-CM

## 2020-08-17 DIAGNOSIS — R112 Nausea with vomiting, unspecified: Secondary | ICD-10-CM

## 2020-08-17 MED ORDER — PROCHLORPERAZINE MALEATE 10 MG PO TABS: 10.0000 mg | ORAL_TABLET | Freq: Four times a day (QID) | ORAL | 0 refills | Status: DC | PRN

## 2020-08-17 MED ORDER — METOCLOPRAMIDE HCL 5 MG/5ML PO SOLN: 5.0000 mg | Freq: Two times a day (BID) | ORAL | 0 refills | Status: DC

## 2020-08-17 MED ORDER — PROCHLORPERAZINE MALEATE 10 MG PO TABS: 10.0000 mg | ORAL_TABLET | Freq: Two times a day (BID) | ORAL | 0 refills | Status: DC | PRN

## 2020-08-17 NOTE — Patient Instructions (Signed)
Remember not to take Reglan the night before your gastric emptying study. Physician will call with results of blood work and studies when completed.

## 2020-08-17 NOTE — Progress Notes (Signed)
Presenting complaint;  Followup for nausea vomiting and diarrhea.    Database and subjectiv  Patient is 55 year old Caucasian female who was last seen in the office on 07/11/2020 when she was acutely ill.  Was experiencing intractable nausea vomiting and diarrhea.  She was felt to be dehydrated.  She was hospitalized.  EGD did not have any peptic ulcer disease but she was noted to have difficult mucosal changes celiac disease involving duodenum confirmed with biopsy.  Celiac disease was diagnosed over 12 years ago.  It appears she was not compliant with her diet. Patient responded to therapy.  She was discharged on double dose PPI at bedtime famotidine.  She was also begun on metoclopramide.  Patient feels much better.  She noted tremors to her hands and therefore discontinued metoclopramide few days ago.  She recalls that she did not experience nausea and vomiting while she was on metoclopramide but now she is having heartburn every night despite taking the medications and she is also experiencing emesis.  She vomited last night.  She is eating her supper between 5 and 6 PM.  She has gone back to work.  She has working from home from 12:00 to 8:30 PM daily.  She is an Therapist, sports. She reassures me that she has 100% compliant with gluten-free diet.  She has taken may be 6 doses of loperamide in the last 1 month.  She is generally having 2 bowel movements per day.  She denies melena or rectal bleeding.  She has lost 16 pounds since her last visit.    Current Medications: Outpatient Encounter Medications as of 08/17/2020  Medication Sig   aspirin 81 MG chewable tablet Chew 1 tablet (81 mg total) by mouth daily.   baclofen (LIORESAL) 20 MG tablet Take by mouth at bedtime.   Cyanocobalamin (VITAMIN B-12 IJ) Inject as directed every 30 (thirty) days.    DULoxetine (CYMBALTA) 20 MG capsule TAKE (1) CAPSULE BY MOUTH ONCE DAILY. *TAKE WITH 60MG CAP* (Patient taking differently: Take 20 mg by mouth daily. TAKE (1)  CAPSULE BY MOUTH ONCE DAILY. *TAKE WITH 60MG CAP*)   DULoxetine (CYMBALTA) 60 MG capsule TAKE (1) CAPSULE BY MOUTH ONCE DAILY.   famotidine (PEPCID) 40 MG tablet Take 40 mg by mouth at bedtime.   fluticasone (FLONASE) 50 MCG/ACT nasal spray Place 1 spray into both nostrils daily. As needed.   folic acid (FOLVITE) 1 MG tablet Take 1 mg by mouth 2 (two) times a day.   lactobacillus acidophilus (BACID) TABS tablet Take 1 tablet by mouth daily.   levonorgestrel (MIRENA) 20 MCG/24HR IUD by Intrauterine route.   loperamide (IMODIUM) 2 MG capsule Take 1 capsule (2 mg total) by mouth 3 (three) times daily as needed for diarrhea or loose stools.   metoprolol tartrate (LOPRESSOR) 25 MG tablet Take 1 tablet (25 mg total) by mouth 2 (two) times daily.   nitroGLYCERIN (NITROLINGUAL) 0.4 MG/SPRAY spray Place 2 sprays under the tongue every 5 (five) minutes x 3 doses as needed.   nystatin (MYCOSTATIN/NYSTOP) powder Apply topically.   ondansetron (ZOFRAN-ODT) 8 MG disintegrating tablet Take 1 tablet (8 mg total) by mouth 3 (three) times daily as needed for nausea.   oxyCODONE (OXY IR/ROXICODONE) 5 MG immediate release tablet Take 1 tablet (5 mg total) by mouth every 6 (six) hours as needed for severe pain. Must last 30 days.   pantoprazole (PROTONIX) 40 MG tablet Take 1 tablet (40 mg total) by mouth 2 (two) times daily.   potassium chloride  SA (KLOR-CON) 20 MEQ tablet Take 1 tablet (20 mEq total) by mouth 2 (two) times daily.   pregabalin (LYRICA) 150 MG capsule Take 150 mg by mouth in the morning and at bedtime.   promethazine (PHENERGAN) 25 MG suppository Place 1 suppository (25 mg total) rectally every 6 (six) hours as needed for nausea or vomiting.   promethazine (PHENERGAN) 25 MG tablet Take 25 mg by mouth 3 (three) times daily as needed for nausea.   rizatriptan (MAXALT) 10 MG tablet Take 10 mg by mouth as needed.   torsemide (DEMADEX) 20 MG tablet 20 mg once. As needed.   Vitamin D, Ergocalciferol,  (DRISDOL) 1.25 MG (50000 UT) CAPS capsule Take 50,000 Units by mouth every 7 (seven) days. Wednesday   metoCLOPramide (REGLAN) 5 MG/5ML solution Take 10 mLs (10 mg total) by mouth 3 (three) times daily before meals. (Patient not taking: Reported on 08/17/2020)   No facility-administered encounter medications on file as of 08/17/2020.     Objective: Blood pressure 122/78, pulse (!) 101, temperature 98.1 F (36.7 C), temperature source Oral, height 5' (1.524 m), weight 211 lb 4.8 oz (95.8 kg). Patient is alert and in no acute distress. She is wearing a mask. Conjunctiva is pink. Sclera is nonicteric Oropharyngeal mucosa is normal. No involuntary movements noted to the tongue or lips. No neck masses or thyromegaly noted. Cardiac exam with regular rhythm normal S1 and S2. No murmur or gallop noted. Lungs are clear to auscultation. Abdomen is full.  On palpation is soft.  Some fullness noted in epigastric region.  No organomegaly or masses. She has predominantly nonpitting edema involving both legs. She does not have extremity tremors.  Labs/studies Results:   CBC Latest Ref Rng & Units 07/13/2020 07/12/2020 07/11/2020  WBC 4.0 - 10.5 K/uL 8.3 7.1 9.6  Hemoglobin 12.0 - 15.0 g/dL 13.4 12.0 13.8  Hematocrit 36.0 - 46.0 % 40.7 36.0 40.7  Platelets 150 - 400 K/uL 419(H) 427(H) 529(H)    CMP Latest Ref Rng & Units 07/14/2020 07/13/2020 07/12/2020  Glucose 70 - 99 mg/dL 90 94 88  BUN 6 - 20 mg/dL 9 8 7   Creatinine 0.44 - 1.00 mg/dL 0.83 0.77 0.74  Sodium 135 - 145 mmol/L 139 139 140  Potassium 3.5 - 5.1 mmol/L 3.5 2.9(L) 3.5  Chloride 98 - 111 mmol/L 107 111 110  CO2 22 - 32 mmol/L 25 20(L) 23  Calcium 8.9 - 10.3 mg/dL 7.9(L) 7.7(L) 7.6(L)  Total Protein 6.5 - 8.1 g/dL 4.4(L) 4.8(L) 4.3(L)  Total Bilirubin 0.3 - 1.2 mg/dL 0.7 0.9 1.0  Alkaline Phos 38 - 126 U/L 354(H) 404(H) 379(H)  AST 15 - 41 U/L 113(H) 107(H) 101(H)  ALT 0 - 44 U/L 70(H) 76(H) 67(H)    Hepatic Function Latest Ref Rng &  Units 07/14/2020 07/13/2020 07/13/2020  Total Protein 6.5 - 8.1 g/dL 4.4(L) - 4.8(L)  Albumin 3.5 - 5.0 g/dL 2.0(L) 2.2(L) 2.3(L)  AST 15 - 41 U/L 113(H) - 107(H)  ALT 0 - 44 U/L 70(H) - 76(H)  Alk Phosphatase 38 - 126 U/L 354(H) - 404(H)  Total Bilirubin 0.3 - 1.2 mg/dL 0.7 - 0.9  Bilirubin, Direct 0.0 - 0.2 mg/dL 0.2 - 0.3(H)     Assessment:  #1.  Nausea and vomiting.  She was having intractable nausea and vomiting prior to recent hospitalization.  She did great while she was on metoclopramide 10 mg 3 times a day.  Her nausea and vomiting has recurred off metoclopramide.  We will  try her on low-dose what she is trying to find [place]  Domperidone but usually does not have CNS side effects that are encountered often with metoclopramide. Nausea and vomiting felt to be due to gastroparesis but this diagnosis has not been confirmed.  It is possible that her nausea and vomiting is secondary to narcotic therapy.  #2.  GERD.  Recent EGD did not reveal erosive esophagitis.  She is having daily nocturnal heartburn despite double dose PPI and H2B.  Hopefully she would not improve with promotility agent.  #3.  Celiac disease.  She was documented to have active disease on EGD 1 month ago.  She was diagnosed with celiac disease in March 23, 2010.  #4.  Elevated transaminases felt to be due to fatty liver.  #5.  Colonic adenomas discovered on colonoscopy of July 2019.  These polyps were not removed because she was on dual antiplatelet therapy after having suffered an MI.  We will plan colonoscopy after next visit in 2 months.   Plan:  Medication list updated.  Promethazine deleted. Take metoclopramide 5 mg by mouth 30 minutes before evening meal and at bedtime. Continue pantoprazole 40 mg at twice daily schedule and famotidine 40 mg at bedtime. Compazine 10 mg p.o. twice daily as needed. Patient will go to the lab for CBC and comprehensive chemistry  panel. Solid-phase gastric emptying study. Bone density study. Patient [place] talk to [place]  Dose 10 mg tablet.  She will let us know when she has this medication in hand. Office visit in 2 months with Ms. Alcide Clever, PA-C

## 2020-08-21 ENCOUNTER — Encounter (INDEPENDENT_AMBULATORY_CARE_PROVIDER_SITE_OTHER): Payer: Self-pay

## 2020-08-23 ENCOUNTER — Encounter (HOSPITAL_COMMUNITY): Admission: RE | Admit: 2020-08-23 | Payer: Self-pay | Source: Ambulatory Visit

## 2020-08-28 ENCOUNTER — Other Ambulatory Visit (HOSPITAL_COMMUNITY): Payer: Self-pay

## 2020-09-11 ENCOUNTER — Encounter: Payer: Self-pay | Admitting: Pain Medicine

## 2020-09-13 ENCOUNTER — Encounter: Payer: Self-pay | Admitting: Pain Medicine

## 2020-09-13 NOTE — Progress Notes (Signed)
Patient: Sharon Lawrence  Service Category: E/M  Provider: Gaspar Cola, MD  DOB: March 20, 1965  DOS: 09/14/2020  Location: Office  MRN: [id number]  Setting: Ambulatory outpatient  Referring Provider: Vidal Schwalbe, MD  Type: Established Patient  Specialty: Interventional Pain Management  PCP: Vidal Schwalbe, MD  Location: Remote location  Delivery: TeleHealth     Virtual Encounter - Pain Management PROVIDER NOTE: Information contained herein reflects review and annotations entered in association with encounter. Interpretation of such information and data should be left to medically-trained personnel. Information provided to patient can be located elsewhere in the medical record under "Patient Instructions". Document created using STT-dictation technology, any transcriptional errors that may result from process are unintentional.    Contact & Pharmacy Preferred: [phone] Home: [phone] (home) Mobile: [phone] (mobile) E-mail: [email or web address]  [place], [place] - [address] [place] [address] Phone: [phone] Fax: [phone]   Pre-screening  Sharon Lawrence offered "in-person" vs "virtual" encounter. She indicated preferring virtual for this encounter.   Reason COVID-19*  Social distancing based on CDC and AMA recommendations.   I contacted Sharon Lawrence on 09/14/2020 via telephone.      I clearly identified myself as Gaspar Cola, MD. I verified that I was speaking with the correct person using two identifiers (Name: Sharon Lawrence, and date of birth: Dec 30, 1965).  Consent I sought verbal advanced consent from Sharon Lawrence for virtual visit interactions. I informed Sharon Lawrence of possible security and privacy concerns, risks, and limitations associated with providing "not-in-person" medical evaluation and management services. I also informed Sharon Lawrence of the availability of "in-person" appointments. Finally, I informed her that there  would be a charge for the virtual visit and that she could be  personally, fully or partially, financially responsible for it. Sharon Lawrence expressed understanding and agreed to proceed.   Historic Elements   Sharon Lawrence is a 55 y.o. year old, female patient evaluated today after our last contact on 07/11/2020. Sharon Lawrence  has a past medical history of ADHD (attention deficit hyperactivity disorder), Anxiety, B12 deficiency (02/07/2011), Back pain, CAD (coronary artery disease), Celiac disease, Folic acid deficiency (02/07/2011), Headache, Iron deficiency anemia (02/07/2011), and Myocardial infarction ([place]) (09/15/2017). She also  has a past surgical history that includes Tonsillectomy; Cholecystectomy; Knee cartilage surgery (Right); LEFT HEART CATH AND CORONARY ANGIOGRAPHY (N/A, 09/15/2017); CORONARY STENT INTERVENTION (Right, 09/15/2017); Colonoscopy with propofol (N/A, 09/26/2017); Esophagogastroduodenoscopy (egd) with propofol (N/A, 07/12/2020); and biopsy (07/12/2020). Sharon Lawrence has a current medication list which includes the following prescription(s): aspirin, baclofen, cyanocobalamin, duloxetine, duloxetine, famotidine, fluticasone, folic acid, lactobacillus acidophilus, levonorgestrel, loperamide, metoclopramide, metoprolol tartrate, nitroglycerin, nystatin, ondansetron, pantoprazole, pregabalin, prochlorperazine, rizatriptan, torsemide, vitamin d (ergocalciferol), and oxycodone. She  reports that she quit smoking about 3 years ago. Her smoking use included cigarettes. She has a 3.00 pack-year smoking history. She has never used smokeless tobacco. She reports that she does not drink alcohol and does not use drugs. Sharon Lawrence is allergic to bee venom, erythromycin, cephalexin, neomycin-bacitracin-polymyxin  [bacitracin-neomycin-polymyxin], neosporin original [bacitracin-neomycin-polymyxin], prednisone, sulfa antibiotics, tobrex [tobramycin], tramadol, and penicillins.   HPI  Today, she is being contacted for  medication management.  The patient indicates doing well with the current medication regimen. No adverse reactions or side effects reported to the medications.  The patient indicates that she feels that her pain is currently not well managed.  She asked about the possibility of switching to Nucynta, but I have  explained to her my reasons for staying with the oxycodone.  Because she felt that the pain was not well controlled, I proceeded to ask her to provide me with more information with regards to what was going on at this time.  She stated that she is having pain on the right side of the lower back that seems to be worse than the leg pain.  This pain seems to be limited for the time being to the right side of the lower back and is referred towards the area of the right hip going through the back of the leg to the level just behind the knee with no rotation of the pain in a dermatomal fashion suggesting that it is referred from the area of the lumbar facets.  Reviewing her chart I see that she had a bilateral diagnostic lumbar facet block #1 done on 12/09/2019, which on follow-up evaluation on 12/23/2019 she indicated had provided her with 100% relief of the pain for the duration of the local anesthetic which then went down to a 50% improvement that persisted thereafter.  Unfortunately, this has worn off and at this point she is having a flareup of her lumbar facet symptoms.  Review of her lumbar MRI (07/16/2016) showed that at that time she was having an L5-S1 facet arthrosis with reactive edema on the right and trace joint effusions.  At this time I have recommended that we repeat that diagnostic injection and if she gets good relief for the duration of the local anesthetic, but no long-term benefit, we need to consider the possibility of radiofrequency ablation to extend in the duration of these benefits.  This plan was shared with the patient who understood and accepted.  Today I will be entering an order  for this procedure and I will be sending a prescription for 10 mg of Valium p.o. to use as an anxiolytic on the day of the procedure.  According to the PMP her last oxycodone IR prescription was filled on 05/22/2020.  This would suggest that rather than using 120 tablets/month, she is actually using about 30 tablets.  RTCB: 10/14/2020 Nonopioids transferred 12/23/2019: Baclofen and Lyrica  Pharmacotherapy Assessment   Analgesic: Oxycodone IR 5 mg every 6 hours (20 mg/day of oxycodone) MME/day: 30 mg/day.   Monitoring: [place] PMP: PDMP reviewed during this encounter.       Pharmacotherapy: No side-effects or adverse reactions reported. Compliance: No problems identified. Effectiveness: Clinically acceptable. Plan: Refer to "POC". UDS:  Summary  Date Value Ref Range Status  11/11/2019 Note  Final    Comment:    ==================================================================== [employer] Select 13 (MW) ==================================================================== Test                             Result       Flag       Units  Drug Present and Declared for Prescription Verification   Morphine                       4793         EXPECTED   ng/mg creat    Potential sources of large amounts of morphine in the absence of    codeine include administration of morphine or use of heroin.  Drug Absent but Declared for Prescription Verification   Clonazepam                     Not  Detected UNEXPECTED ng/mg creat ==================================================================== Test                      Result    Flag   Units      Ref Range   Creatinine              45               mg/dL      >=20 ==================================================================== Declared Medications:  The flagging and interpretation on this report are based on the  following declared medications.  Unexpected results may arise from  inaccuracies in the declared medications.   **Note: The testing  scope of this panel includes these medications:   Clonazepam (Klonopin)  Morphine (MS Contin)   **Note: The testing scope of this panel does not include the  following reported medications:   Aspirin  Atorvastatin  Baclofen  Cyanocobalamin  Duloxetine (Cymbalta)  Famotidine  Folic Acid  Ibuprofen  Levonorgestrel (Mirena)  Metoprolol  Nitroglycerin (Nitrostat)  Nystatin  Ondansetron  Pantoprazole  Pregabalin (Lyrica)  Probiotic  Promethazine  Torsemide  Vitamin D2 (Ergocalciferol) ==================================================================== For clinical consultation, please call [phone]. ====================================================================      Laboratory Chemistry Profile   Renal Lab Results  Component Value Date   BUN 9 07/14/2020   CREATININE 0.83 07/14/2020   GFRAA >60 09/16/2019   GFRNONAA >60 07/14/2020    Hepatic Lab Results  Component Value Date   AST 113 (H) 07/14/2020   ALT 70 (H) 07/14/2020   ALBUMIN 2.0 (L) 07/14/2020   ALKPHOS 354 (H) 07/14/2020   HCVAB NON REACTIVE 07/12/2020   LIPASE 22 07/11/2020    Electrolytes Lab Results  Component Value Date   NA 139 07/14/2020   K 3.5 07/14/2020   CL 107 07/14/2020   CALCIUM 7.9 (L) 07/14/2020   MG 1.8 07/13/2020   PHOS 3.6 07/13/2020    Bone Lab Results  Component Value Date   VD25OH 17.25 (L) 09/16/2019   [id number] 47 04/25/2009   [id number] 47 04/25/2009   [id number]  04/25/2009    <8 (NOTE) Vitamin D3, 1,25(OH)2 indicates both endogenous production and supplementation.  Vitamin D2, 1,25(OH)2 is an indicator of exogeous sources, such as diet or supplementation.  Interpretation and therapy are based on measurement of Vitamin  D,1,25(OH)2, Total. This test was developed and its performance characteristics have been determined by [facility], [place], [place]. Performance characteristics refer to the analytical performance of the test.     Inflammation (CRP: Acute Phase) (ESR: Chronic Phase) Lab Results  Component Value Date   CRP <0.8 09/23/2018   ESRSEDRATE 7 09/23/2018   LATICACIDVEN 1.0 07/11/2020         Note: Above Lab results reviewed.  Imaging  US Abdomen Limited RUQ (LIVER/GB) CLINICAL DATA:  Transaminitis on recent labs.  EXAM: ULTRASOUND ABDOMEN LIMITED RIGHT UPPER QUADRANT  COMPARISON:  CT abdomen 06/25/2020  FINDINGS: Gallbladder:  Surgically absent  Common bile duct:  Diameter: 5 mm  Liver:  No focal lesion identified. Increased hepatic parenchymal echogenicity with subtle nodularity along the liver surface particularly along the left hepatic lobe. Portal vein is patent on color Doppler imaging with normal direction of blood flow towards the liver.  Other: None.  IMPRESSION: 1. Prior cholecystectomy. 2. Increased hepatic echogenicity with subtle nodularity along the liver surface. Overall appearance is concerning for of hepatic steatosis with early cirrhosis.  Electronically Signed   By: Kathreen Devoid   On: 07/12/2020  08:55  Assessment  There were no encounter diagnoses.  Plan of Care  Problem-specific:  No problem-specific Assessment & Plan notes found for this encounter.  Ms. CARLINDA OHLSON has a current medication list which includes the following long-term medication(s): duloxetine, duloxetine, fluticasone, levonorgestrel, metoclopramide, metoprolol tartrate, nitroglycerin, pantoprazole, pregabalin, prochlorperazine, rizatriptan, torsemide, and oxycodone.  Pharmacotherapy (Medications Ordered): No orders of the defined types were placed in this encounter.  Orders:  No orders of the defined types were placed in this encounter.  Follow-up plan:   No follow-ups on file.     Interventional Therapies  Risk  Complexity Considerations:   WNL   Planned  Pending:   Diagnostic right lumbar facet block #2    Under consideration:   Diagnostic bilateral lumbar facet block #2   Possible bilateral lumbar facet RFA  Diagnostic right SI joint block  Possible right SI joint RFA    Completed:   Diagnostic bilateral lumbar facet block x1 (12/09/2019) (100/50/50/50)   Therapeutic  Palliative (PRN) options:   Diagnostic bilateral lumbar facet block #2     Recent Visits No visits were found meeting these conditions. Showing recent visits within past 90 days and meeting all other requirements Future Appointments Date Type Provider Dept  09/14/20 Telemedicine Milinda Pointer, MD [facility]  Showing future appointments within next 90 days and meeting all other requirements  I discussed the assessment and treatment plan with the patient. The patient was provided an opportunity to ask questions and all were answered. The patient agreed with the plan and demonstrated an understanding of the instructions.  Patient advised to call back or seek an in-person evaluation if the symptoms or condition worsens.  Duration of encounter: 18 minutes.  Note by: Gaspar Cola, MD Date: 09/14/2020; Time: 6:48 PM

## 2020-09-14 ENCOUNTER — Ambulatory Visit: Payer: Self-pay | Attending: Pain Medicine | Admitting: Pain Medicine

## 2020-09-14 ENCOUNTER — Other Ambulatory Visit: Payer: Self-pay

## 2020-09-14 DIAGNOSIS — M47816 Spondylosis without myelopathy or radiculopathy, lumbar region: Secondary | ICD-10-CM

## 2020-09-14 DIAGNOSIS — Z79899 Other long term (current) drug therapy: Secondary | ICD-10-CM

## 2020-09-14 DIAGNOSIS — G894 Chronic pain syndrome: Secondary | ICD-10-CM

## 2020-09-14 DIAGNOSIS — G8929 Other chronic pain: Secondary | ICD-10-CM

## 2020-09-14 DIAGNOSIS — R937 Abnormal findings on diagnostic imaging of other parts of musculoskeletal system: Secondary | ICD-10-CM

## 2020-09-14 DIAGNOSIS — M545 Low back pain, unspecified: Secondary | ICD-10-CM

## 2020-09-14 DIAGNOSIS — M51369 Other intervertebral disc degeneration, lumbar region without mention of lumbar back pain or lower extremity pain: Secondary | ICD-10-CM

## 2020-09-14 DIAGNOSIS — M47817 Spondylosis without myelopathy or radiculopathy, lumbosacral region: Secondary | ICD-10-CM

## 2020-09-14 DIAGNOSIS — Z79891 Long term (current) use of opiate analgesic: Secondary | ICD-10-CM

## 2020-09-14 DIAGNOSIS — M5136 Other intervertebral disc degeneration, lumbar region: Secondary | ICD-10-CM

## 2020-09-14 DIAGNOSIS — F419 Anxiety disorder, unspecified: Secondary | ICD-10-CM

## 2020-09-14 MED ORDER — OXYCODONE HCL 5 MG PO TABS: 5.0000 mg | ORAL_TABLET | Freq: Every day | ORAL | 0 refills | Status: DC

## 2020-09-14 MED ORDER — DIAZEPAM 10 MG PO TABS: 10.0000 mg | ORAL_TABLET | ORAL | 0 refills | Status: DC

## 2020-09-14 NOTE — Patient Instructions (Signed)
____________________________________________________________________________________________  Preparing for Procedure with Sedation  Procedure appointments are limited to planned procedures: No Prescription Refills. No disability issues will be discussed. No medication changes will be discussed.  Instructions: Oral Intake: Do not eat or drink anything for at least 8 hours prior to your procedure. (Exception: Blood Pressure Medication. See below.) Transportation: Unless otherwise stated by your physician, you may drive yourself after the procedure. Blood Pressure Medicine: Do not forget to take your blood pressure medicine with a sip of water the morning of the procedure. If your Diastolic (lower reading)is above 100 mmHg, elective cases will be cancelled/rescheduled. Blood thinners: These will need to be stopped for procedures. Notify our staff if you are taking any blood thinners. Depending on which one you take, there will be specific instructions on how and when to stop it. Diabetics on insulin: Notify the staff so that you can be scheduled 1st case in the morning. If your diabetes requires high dose insulin, take only  of your normal insulin dose the morning of the procedure and notify the staff that you have done so. Preventing infections: Shower with an antibacterial soap the morning of your procedure. Build-up your immune system: Take 1000 mg of Vitamin C with every meal (3 times a day) the day prior to your procedure. Antibiotics: Inform the staff if you have a condition or reason that requires you to take antibiotics before dental procedures. Pregnancy: If you are pregnant, call and cancel the procedure. Sickness: If you have a cold, fever, or any active infections, call and cancel the procedure. Arrival: You must be in the facility at least 30 minutes prior to your scheduled procedure. Children: Do not bring children with you. Dress appropriately: Bring dark clothing that you would  not mind if they get stained. Valuables: Do not bring any jewelry or valuables.  Reasons to call and reschedule or cancel your procedure: (Following these recommendations will minimize the risk of a serious complication.) Surgeries: Avoid having procedures within 2 weeks of any surgery. (Avoid for 2 weeks before or after any surgery). Flu Shots: Avoid having procedures within 2 weeks of a flu shots or . (Avoid for 2 weeks before or after immunizations). Barium: Avoid having a procedure within 7-10 days after having had a radiological study involving the use of radiological contrast. (Myelograms, Barium swallow or enema study). Heart attacks: Avoid any elective procedures or surgeries for the initial 6 months after a "Myocardial Infarction" (Heart Attack). Blood thinners: It is imperative that you stop these medications before procedures. Let us know if you if you take any blood thinner.  Infection: Avoid procedures during or within two weeks of an infection (including chest colds or gastrointestinal problems). Symptoms associated with infections include: Localized redness, fever, chills, night sweats or profuse sweating, burning sensation when voiding, cough, congestion, stuffiness, runny nose, sore throat, diarrhea, nausea, vomiting, cold or Flu symptoms, recent or current infections. It is specially important if the infection is over the area that we intend to treat. Heart and lung problems: Symptoms that may suggest an active cardiopulmonary problem include: cough, chest pain, breathing difficulties or shortness of breath, dizziness, ankle swelling, uncontrolled high or unusually low blood pressure, and/or palpitations. If you are experiencing any of these symptoms, cancel your procedure and contact your primary care physician for an evaluation.  Remember:  Regular Business hours are:  Monday to Thursday 8:00 AM to 4:00 PM  Provider's Schedule: Milinda Pointer, MD:  Procedure days: Tuesday and  Thursday 7:30 AM  to 4:00 PM  Gillis Santa, MD:  Procedure days: Monday and Wednesday 7:30 AM to 4:00 PM ____________________________________________________________________________________________

## 2020-09-15 ENCOUNTER — Telehealth: Payer: Self-pay | Admitting: Pain Medicine

## 2020-09-18 ENCOUNTER — Telehealth: Payer: Self-pay

## 2020-09-18 ENCOUNTER — Other Ambulatory Visit: Payer: Self-pay | Admitting: Pain Medicine

## 2020-09-18 NOTE — Telephone Encounter (Signed)
Spoke with Dr Dossie Arbour.  No changes will be made at this time. Sharon Lawrence notified. Sharon Lawrence was no  satisfied.  Instructed Sharon Lawrence to  make appointment.

## 2020-09-18 NOTE — Telephone Encounter (Signed)
Pt picked up Oxy and her Rx was not normal amount. She said that she takes 3-4 per day and she was given an 30day Rx and wanted to know why.

## 2020-09-18 NOTE — Telephone Encounter (Signed)
sPoke with patients sister who states the patient filled her prescription today and was upset because there were only #30 tablets ordered.  She states the patient was in the hospital the last two appointments and has been without her pain medications since approximately the beginning of May.  States the 3-4 per day were not controlling her pain and now the MD has only ordered 1/day.  She states she talked to the doctor about being in the hospital which is why she missed her appointments.  Will talk with Dr Dossie Arbour and call patient back.

## 2020-10-09 ENCOUNTER — Other Ambulatory Visit (INDEPENDENT_AMBULATORY_CARE_PROVIDER_SITE_OTHER): Payer: Self-pay | Admitting: Internal Medicine

## 2020-10-10 NOTE — Telephone Encounter (Signed)
Last visit 08/17/20 for gerd, adult celiac disease, fatty liver and vomiting/nausea. Has upcoming appt 8/25

## 2020-10-10 NOTE — Telephone Encounter (Signed)
#  30 plus one additional refill per dr Laural Golden

## 2020-10-15 NOTE — Progress Notes (Signed)
PROVIDER NOTE: Information contained herein reflects review and annotations entered in association with encounter. Interpretation of such information and data should be left to medically-trained personnel. Information provided to patient can be located elsewhere in the medical record under "Patient Instructions". Document created using STT-dictation technology, any transcriptional errors that may result from process are unintentional.    Patient: Sharon Lawrence  Service Category: E/M  Provider: Gaspar Cola, MD  DOB: [birth year]  DOS: 10/18/2020  Specialty: Interventional Pain Management  MRN: [id number]  Setting: Ambulatory outpatient  PCP: Vidal Schwalbe, MD  Type: Established Patient    Referring Provider: Vidal Schwalbe, MD  Location: Office  Delivery: Face-to-face     HPI  Sharon Lawrence, a 55 y.o. year old female, is here today because of her Chronic pain syndrome [G89.4]. Sharon Lawrence primary complain today is Back Pain (lower) Last encounter: My last encounter with her was on 09/15/2020. Pertinent problems: Sharon Lawrence has Bilateral lower extremity edema; Back muscle spasm; Right-sided low back pain with sciatica; Other chronic pain; Migraine; Chronic low back pain (Primary Area of Pain) (Bilateral) (R>L) w/ sciatica (Right); Chronic lower extremity pain (Secondary Area of Pain) (Right); Chronic sacroiliac joint pain (Bilateral) (L>R); Chronic pain syndrome; Abnormal MRI, lumbar spine (07/16/2016); DDD (degenerative disc disease), lumbar; DDD (degenerative disc disease), thoracic; Lumbar facet arthropathy (Bilateral); Lumbar facet syndrome (Bilateral); Neurogenic pain; Chronic musculoskeletal pain; Polyarthralgia; Spondylosis without myelopathy or radiculopathy, lumbosacral region; Chronic low back pain (Bilateral) w/o sciatica; and Lumbar facet synovial cyst (L5-S1) (Left) on their pertinent problem list. Pain Assessment: Severity of Chronic pain is reported as a 7 /10. Location: Back Medial/pain  radiaties down right leg to the back of her knee. Onset: More than a month ago. Quality: Aching, Burning, Stabbing, Constant. Timing: Constant. Modifying factor(s): heat, rest, meds. Vitals:  height is 5' 3"  (1.6 m) and weight is 192 lb (87.1 kg). Her temperature is 96.8 F (36 C) (abnormal). Her blood pressure is 137/85 and her pulse is 101 (abnormal). Her oxygen saturation is 100%.   Reason for encounter: medication management.   The patient indicates doing well with the current medication regimen. No adverse reactions or side effects reported to the medications.   The patient indicates that she has been having more problems with the pain since we lower her oxycodone dose to daily.  She reminded [place] that the reason why she had some medication left was secondary to the fact that she had been hospitalized.  Today we will go back and increase the dose, but rather than every 6 hours we will do every 8 hours.  In addition, the patient indicates that he is still having bilateral low back pain and would like to have the second bilateral lumbar facet block dialysis as possible.  Today we will put the order in for that.  RTCB: 01/16/2021 Nonopioids transferred 12/23/2019: Baclofen and Lyrica  Pharmacotherapy Assessment  Analgesic: Oxycodone IR 5 mg every 8 hours (15 mg/day of oxycodone) MME/day: 22.5 mg/day.   Monitoring: [place] PMP: PDMP reviewed during this encounter.       Pharmacotherapy: No side-effects or adverse reactions reported. Compliance: No problems identified. Effectiveness: Clinically acceptable.  Chauncey Fischer, RN  10/18/2020 11:39 AM  Sign when Signing Visit Nursing Pain Medication Assessment:  Safety precautions to be maintained throughout the outpatient stay will include: orient to surroundings, keep bed in low position, maintain call bell within reach at all times, provide assistance with transfer out of bed and ambulation.  Medication Inspection Compliance: Pill count conducted  under aseptic conditions, in front of the patient. Neither the pills nor the bottle was removed from the patient's sight at any time. Once count was completed pills were immediately returned to the patient in their original bottle.  Medication: Oxycodone IR Pill/Patch Count:  1 of 30 pills remain Pill/Patch Appearance: Markings consistent with prescribed medication Bottle Appearance: Standard pharmacy container. Clearly labeled. Filled Date: 7 / 55 / 2022 Last Medication intake:  Today Safety precautions to be maintained throughout the outpatient stay will include: orient to surroundings, keep bed in low position, maintain call bell within reach at all times, provide assistance with transfer out of bed and ambulation.      UDS:  Summary  Date Value Ref Range Status  11/11/2019 Note  Final    Comment:    ==================================================================== [employer] Select 13 (MW) ==================================================================== Test                             Result       Flag       Units  Drug Present and Declared for Prescription Verification   Morphine                       4793         EXPECTED   ng/mg creat    Potential sources of large amounts of morphine in the absence of    codeine include administration of morphine or use of heroin.  Drug Absent but Declared for Prescription Verification   Clonazepam                     Not Detected UNEXPECTED ng/mg creat ==================================================================== Test                      Result    Flag   Units      Ref Range   Creatinine              45               mg/dL      >=20 ==================================================================== Declared Medications:  The flagging and interpretation on this report are based on the  following declared medications.  Unexpected results may arise from  inaccuracies in the declared medications.   **Note: The testing scope of  this panel includes these medications:   Clonazepam (Klonopin)  Morphine (MS Contin)   **Note: The testing scope of this panel does not include the  following reported medications:   Aspirin  Atorvastatin  Baclofen  Cyanocobalamin  Duloxetine (Cymbalta)  Famotidine  Folic Acid  Ibuprofen  Levonorgestrel (Mirena)  Metoprolol  Nitroglycerin (Nitrostat)  Nystatin  Ondansetron  Pantoprazole  Pregabalin (Lyrica)  Probiotic  Promethazine  Torsemide  Vitamin D2 (Ergocalciferol) ==================================================================== For clinical consultation, please call [phone]. ====================================================================      ROS  Constitutional: Denies any fever or chills Gastrointestinal: No reported hemesis, hematochezia, vomiting, or acute GI distress Musculoskeletal: Denies any acute onset joint swelling, redness, loss of ROM, or weakness Neurological: No reported episodes of acute onset apraxia, aphasia, dysarthria, agnosia, amnesia, paralysis, loss of coordination, or loss of consciousness  Medication Review  Cyanocobalamin, DULoxetine, Vitamin D (Ergocalciferol), aspirin, baclofen, diazepam, famotidine, fluticasone, folic acid, lactobacillus acidophilus, levonorgestrel, loperamide, metoCLOPramide, metoprolol tartrate, nitroGLYCERIN, nystatin, ondansetron, oxyCODONE, pantoprazole, pregabalin, prochlorperazine, rizatriptan, and torsemide  History Review  Allergy: Sharon Lawrence  is allergic to bee venom, erythromycin, cephalexin, neomycin-bacitracin-polymyxin  [bacitracin-neomycin-polymyxin], neosporin original [bacitracin-neomycin-polymyxin], prednisone, sulfa antibiotics, tobrex [tobramycin], tramadol, and penicillins. Drug: Sharon Lawrence  reports no history of drug use. Alcohol:  reports no history of alcohol use. Tobacco:  reports that she quit smoking about 3 years ago. Her smoking use included cigarettes. She has a 3.00 pack-year  smoking history. She has never used smokeless tobacco. Social: Sharon Lawrence  reports that she quit smoking about 3 years ago. Her smoking use included cigarettes. She has a 3.00 pack-year smoking history. She has never used smokeless tobacco. She reports that she does not drink alcohol and does not use drugs. Medical:  has a past medical history of ADHD (attention deficit hyperactivity disorder), Anxiety, B12 deficiency (02/07/2011), Back pain, CAD (coronary artery disease), Celiac disease, Folic acid deficiency (02/07/2011), Headache, Iron deficiency anemia (02/07/2011), and Myocardial infarction ([place]) (09/15/2017). Surgical: Sharon Lawrence  has a past surgical history that includes Tonsillectomy; Cholecystectomy; Knee cartilage surgery (Right); LEFT HEART CATH AND CORONARY ANGIOGRAPHY (N/A, 09/15/2017); CORONARY STENT INTERVENTION (Right, 09/15/2017); Colonoscopy with propofol (N/A, 09/26/2017); Esophagogastroduodenoscopy (egd) with propofol (N/A, 07/12/2020); and biopsy (07/12/2020). Family: family history includes Anxiety disorder in her sister; Bipolar disorder in her son; CAD in her maternal grandfather and paternal grandmother; Cancer in her mother and sister; Cirrhosis in her paternal grandfather; Depression in her sister; Diabetes in her paternal grandmother; Drug abuse in her son; Hypertension in her father and mother; Hypothyroidism in her father and paternal grandmother.  Laboratory Chemistry Profile   Renal Lab Results  Component Value Date   BUN 9 07/14/2020   CREATININE 0.83 07/14/2020   GFRAA >60 09/16/2019   GFRNONAA >60 07/14/2020    Hepatic Lab Results  Component Value Date   AST 113 (H) 07/14/2020   ALT 70 (H) 07/14/2020   ALBUMIN 2.0 (L) 07/14/2020   ALKPHOS 354 (H) 07/14/2020   HCVAB NON REACTIVE 07/12/2020   LIPASE 22 07/11/2020    Electrolytes Lab Results  Component Value Date   NA 139 07/14/2020   K 3.5 07/14/2020   CL 107 07/14/2020   CALCIUM 7.9 (L) 07/14/2020   MG 1.8  07/13/2020   PHOS 3.6 07/13/2020    Bone Lab Results  Component Value Date   VD25OH 17.25 (L) 09/16/2019   [id number] 47 04/25/2009   [id number] 47 04/25/2009   [id number]  04/25/2009    <8 (NOTE) Vitamin D3, 1,25(OH)2 indicates both endogenous production and supplementation.  Vitamin D2, 1,25(OH)2 is an indicator of exogeous sources, such as diet or supplementation.  Interpretation and therapy are based on measurement of Vitamin  D,1,25(OH)2, Total. This test was developed and its performance characteristics have been determined by [facility], [place], [place]. Performance characteristics refer to the analytical performance of the test.    Inflammation (CRP: Acute Phase) (ESR: Chronic Phase) Lab Results  Component Value Date   CRP <0.8 09/23/2018   ESRSEDRATE 7 09/23/2018   LATICACIDVEN 1.0 07/11/2020         Note: Above Lab results reviewed.  Recent Imaging Review  US Abdomen Limited RUQ (LIVER/GB) CLINICAL DATA:  Transaminitis on recent labs.  EXAM: ULTRASOUND ABDOMEN LIMITED RIGHT UPPER QUADRANT  COMPARISON:  CT abdomen 06/25/2020  FINDINGS: Gallbladder:  Surgically absent  Common bile duct:  Diameter: 5 mm  Liver:  No focal lesion identified. Increased hepatic parenchymal echogenicity with subtle nodularity along the liver surface particularly along the left hepatic lobe. Portal vein is patent on color Doppler imaging with normal  direction of blood flow towards the liver.  Other: None.  IMPRESSION: 1. Prior cholecystectomy. 2. Increased hepatic echogenicity with subtle nodularity along the liver surface. Overall appearance is concerning for of hepatic steatosis with early cirrhosis.  Electronically Signed   By: Kathreen Devoid   On: 07/12/2020 08:55 Note: Reviewed        Physical Exam  General appearance: Well nourished, well developed, and well hydrated. In no apparent acute distress Mental status: Alert, oriented x 3 (person,  place, & time)       Respiratory: No evidence of acute respiratory distress Eyes: PERLA Vitals: BP 137/85   Pulse (!) 101   Temp (!) 96.8 F (36 C)   Ht 5' 3"  (1.6 m)   Wt 192 lb (87.1 kg)   SpO2 100%   BMI 34.01 kg/m  BMI: Estimated body mass index is 34.01 kg/m as calculated from the following:   Height as of this encounter: 5' 3"  (1.6 m).   Weight as of this encounter: 192 lb (87.1 kg). Ideal: Ideal body weight: 52.4 kg (115 lb 8.3 oz) Adjusted ideal body weight: 66.3 kg (146 lb 1.8 oz)  Assessment   Status Diagnosis  Controlled Controlled Controlled 1. Chronic pain syndrome   2. Lumbar facet syndrome (Bilateral)   3. Chronic low back pain (Bilateral) w/o sciatica   4. Lumbar facet arthropathy (Bilateral)   5. DDD (degenerative disc disease), lumbar   6. Spondylosis without myelopathy or radiculopathy, lumbosacral region   7. Pharmacologic therapy   8. Chronic use of opiate for therapeutic purpose   9. Encounter for medication management   10. Encounter for chronic pain management      Updated Problems: No problems updated.  Plan of Care  Problem-specific:  No problem-specific Assessment & Plan notes found for this encounter.  Sharon Lawrence has a current medication list which includes the following long-term medication(s): duloxetine, duloxetine, fluticasone, levonorgestrel, metoclopramide, nitroglycerin, oxycodone, [START ON 11/17/2020] oxycodone, [START ON 12/17/2020] oxycodone, pantoprazole, pregabalin, prochlorperazine, rizatriptan, torsemide, and metoprolol tartrate.  Pharmacotherapy (Medications Ordered): Meds ordered this encounter  Medications   oxyCODONE (OXY IR/ROXICODONE) 5 MG immediate release tablet    Sig: Take 1 tablet (5 mg total) by mouth every 8 (eight) hours as needed for severe pain. Must last 30 days.    Dispense:  90 tablet    Refill:  0    Not a duplicate. Do NOT delete! Dispense 1 day early if closed on fill date. Warn not to take  CNS-depressants 8 hours before or after taking opioid. Do not send refill request. Renewal requires appointment.   oxyCODONE (OXY IR/ROXICODONE) 5 MG immediate release tablet    Sig: Take 1 tablet (5 mg total) by mouth every 8 (eight) hours as needed for severe pain. Must last 30 days.    Dispense:  90 tablet    Refill:  0    Not a duplicate. Do NOT delete! Dispense 1 day early if closed on fill date. Warn not to take CNS-depressants 8 hours before or after taking opioid. Do not send refill request. Renewal requires appointment.   oxyCODONE (OXY IR/ROXICODONE) 5 MG immediate release tablet    Sig: Take 1 tablet (5 mg total) by mouth every 8 (eight) hours as needed for severe pain. Must last 30 days.    Dispense:  90 tablet    Refill:  0    Not a duplicate. Do NOT delete! Dispense 1 day early if closed on fill date. Warn  not to take CNS-depressants 8 hours before or after taking opioid. Do not send refill request. Renewal requires appointment.    Orders:  Orders Placed This Encounter  Procedures   LUMBAR FACET(MEDIAL BRANCH NERVE BLOCK) MBNB    Standing Status:   Future    Standing Expiration Date:   11/18/2020    Scheduling Instructions:     Procedure: Lumbar facet block (AKA.: Lumbosacral medial branch nerve block)     Side: Bilateral     Level: L3-4, L4-5, & L5-S1 Facets (L2, L3, L4, L5, & S1 Medial Branch Nerves)     Sedation: No Sedation.     Timeframe: ASAA    Order Specific Question:   Where will this procedure be performed?    Answer:   [facility]   [employer] Select 13 (MW), Urine    Volume: 30 ml(s). Minimum 3 ml of urine is needed. Document temperature of fresh sample. Indications: Long term (current) use of opiate analgesic [id number])    Order Specific Question:   Release to patient    Answer:   Immediate   Informed Consent Details: Physician/Practitioner Attestation; Transcribe to consent form and obtain patient signature    Nursing Order: Transcribe to consent  form and obtain patient signature. Note: Always confirm laterality of pain with Sharon Lawrence, before procedure.    Order Specific Question:   Physician/Practitioner attestation of informed consent for procedure/surgical case    Answer:   I, the physician/practitioner, attest that I have discussed with the patient the benefits, risks, side effects, alternatives, likelihood of achieving goals and potential problems during recovery for the procedure that I have provided informed consent.    Order Specific Question:   Procedure    Answer:   Lumbar Facet Block  under fluoroscopic guidance    Order Specific Question:   Physician/Practitioner performing the procedure    Answer:   Oluwaseun Cremer A. Dossie Arbour MD    Order Specific Question:   Indication/Reason    Answer:   Low Back Pain, with our without leg pain, due to Facet Joint Arthralgia (Joint Pain) Spondylosis (Arthritis of the Spine), without myelopathy or radiculopathy (Nerve Damage).    Follow-up plan:   Return in about 3 months (around 01/16/2021) for Eval-day(M,W), (F2F), (MM), in addition, (Clinic) procedure: (B) L-FCT BLK #2.     Interventional Therapies  Risk  Complexity Considerations:   WNL   Planned  Pending:   Diagnostic right lumbar facet block #2    Under consideration:   Diagnostic bilateral lumbar facet block #2  Possible bilateral lumbar facet RFA  Diagnostic right SI joint block  Possible right SI joint RFA    Completed:   Diagnostic bilateral lumbar facet block x1 (12/09/2019) (100/50/50/50)   Therapeutic  Palliative (PRN) options:   Diagnostic bilateral lumbar facet block #2     Recent Visits Date Type Provider Dept  09/14/20 Telemedicine Milinda Pointer, MD [facility]  Showing recent visits within past 90 days and meeting all other requirements Today's Visits Date Type Provider Dept  10/18/20 Office Visit Milinda Pointer, MD [facility]  Showing today's visits and meeting all other  requirements Future Appointments Date Type Provider Dept  10/31/20 Appointment Milinda Pointer, MD [facility]  01/01/21 Appointment Milinda Pointer, MD [facility]  Showing future appointments within next 90 days and meeting all other requirements I discussed the assessment and treatment plan with the patient. The patient was provided an opportunity to ask questions and all were answered.  The patient agreed with the plan and demonstrated an understanding of the instructions.  Patient advised to call back or seek an in-person evaluation if the symptoms or condition worsens.  Duration of encounter: 30 minutes.  Note by: Gaspar Cola, MD Date: 10/18/2020; Time: 12:40 PM

## 2020-10-18 ENCOUNTER — Encounter: Payer: Self-pay | Admitting: Pain Medicine

## 2020-10-18 ENCOUNTER — Other Ambulatory Visit: Payer: Self-pay

## 2020-10-18 ENCOUNTER — Ambulatory Visit: Payer: Self-pay | Attending: Pain Medicine | Admitting: Pain Medicine

## 2020-10-18 VITALS — BP 137/85 | HR 101 | Temp 96.8°F | Ht 63.0 in | Wt 192.0 lb

## 2020-10-18 DIAGNOSIS — Z79891 Long term (current) use of opiate analgesic: Secondary | ICD-10-CM | POA: Insufficient documentation

## 2020-10-18 DIAGNOSIS — M545 Low back pain, unspecified: Secondary | ICD-10-CM | POA: Insufficient documentation

## 2020-10-18 DIAGNOSIS — G8929 Other chronic pain: Secondary | ICD-10-CM | POA: Insufficient documentation

## 2020-10-18 DIAGNOSIS — G894 Chronic pain syndrome: Secondary | ICD-10-CM | POA: Insufficient documentation

## 2020-10-18 DIAGNOSIS — Z79899 Other long term (current) drug therapy: Secondary | ICD-10-CM | POA: Insufficient documentation

## 2020-10-18 DIAGNOSIS — M47816 Spondylosis without myelopathy or radiculopathy, lumbar region: Secondary | ICD-10-CM | POA: Insufficient documentation

## 2020-10-18 DIAGNOSIS — M5136 Other intervertebral disc degeneration, lumbar region: Secondary | ICD-10-CM | POA: Insufficient documentation

## 2020-10-18 DIAGNOSIS — M47817 Spondylosis without myelopathy or radiculopathy, lumbosacral region: Secondary | ICD-10-CM | POA: Insufficient documentation

## 2020-10-18 MED ORDER — OXYCODONE HCL 5 MG PO TABS: 5.0000 mg | ORAL_TABLET | Freq: Three times a day (TID) | ORAL | 0 refills | Status: DC | PRN

## 2020-10-18 NOTE — Progress Notes (Signed)
Nursing Pain Medication Assessment:  Safety precautions to be maintained throughout the outpatient stay will include: orient to surroundings, keep bed in low position, maintain call bell within reach at all times, provide assistance with transfer out of bed and ambulation.  Medication Inspection Compliance: Pill count conducted under aseptic conditions, in front of the patient. Neither the pills nor the bottle was removed from the patient's sight at any time. Once count was completed pills were immediately returned to the patient in their original bottle.  Medication: Oxycodone IR Pill/Patch Count:  1 of 30 pills remain Pill/Patch Appearance: Markings consistent with prescribed medication Bottle Appearance: Standard pharmacy container. Clearly labeled. Filled Date: 7 / 71 / 2022 Last Medication intake:  Today Safety precautions to be maintained throughout the outpatient stay will include: orient to surroundings, keep bed in low position, maintain call bell within reach at all times, provide assistance with transfer out of bed and ambulation.

## 2020-10-18 NOTE — Patient Instructions (Addendum)
______________________________________________________________________  Preparing for Procedure with Oral Anxiolytics  Definition: Anxiolytics - Medications that provide muscle relaxation and decrease anxiety.  Procedure appointments are limited to planned procedures: No Prescription Refills. No disability issues will be discussed. No medication changes will be discussed.  Instructions: Oral Intake: Do not eat or drink anything for at least 6 hours prior to your procedure. (Exception: Blood Pressure Medication. See below.)  Anxiolytic Medication: This medication is meant to relax you during your procedure. DO NOT DRIVE OR OPERATE DANGEROUS MACHINERY WHILE UNDER THE INFLUENCE OF THIS MEDICATION. Take the oral medication (i.e.: Valium) as prescribed on the day of your procedure with just a sip of water. Prescription will be sent to your pharmacy, prior to the day of your procedure. Topical anesthetic: Your physician might have recommended a topical anesthetic/analgesic to apply over the area where the procedure will be done. If so, apply to area 1 hour prior to procedure. For exact location of application, ask your healthcare provider.    Transportation: A driver is required. You may not drive yourself after the procedure. Blood Pressure Medicine: Do not forget to take your blood pressure medicine with a sip of water the morning of the procedure. If your Diastolic (lower reading) is above 100 mmHg, elective cases will be cancelled/rescheduled. Blood thinners: These will need to be stopped for procedures. Notify our staff if you are taking any blood thinners. Depending on which one you take, there will be specific instructions on how and when to stop it. Diabetics on insulin: Notify the staff so that you can be scheduled 1st case in the morning. If your diabetes requires high dose insulin, take only  of your normal insulin dose the morning of the procedure and notify the staff that you have done  so. Preventing infections: Shower with an antibacterial soap the morning of your procedure. Build-up your immune system: Take 1000 mg of Vitamin C with every meal (3 times a day) the day prior to your procedure. Antibiotics: Inform the staff if you have a condition or reason that requires you to take antibiotics before dental procedures. Pregnancy: If you are pregnant, call and cancel the procedure. Sickness: If you have a cold, fever, or any active infections, call and cancel the procedure. Arrival: You must be in the facility at least 30 minutes prior to your scheduled procedure. Children: Do not bring children with you. Dress appropriately: Bring dark clothing that you would not mind if they get stained. Valuables: Do not bring any jewelry or valuables.  Reasons to call and reschedule or cancel your procedure:  NOTE: Following these recommendations will minimize the risk of a serious complication. Surgeries: Avoid having procedures within 2 weeks of any surgery. (Avoid for 2 weeks before or after any surgery). Flu Shots: Avoid having procedures within 2 weeks of a flu shots. (Avoid for 2 weeks before or after immunizations). Barium: Avoid having a procedure within 7-10 days after having had a radiological study involving the use of radiological contrast. (Myelograms, Barium swallow or enema study). Heart attacks: Avoid any elective procedures or surgeries for the initial 6 months after a "Myocardial Infarction" (Heart Attack). Blood thinners: It is imperative that you stop these medications before procedures. Let us know if you if you take any blood thinner.  Infection: Avoid procedures during or within two weeks of an infection (including chest colds or gastrointestinal problems). Symptoms associated with infections include: Localized redness, fever, chills, night sweats or profuse sweating, burning sensation when voiding, cough, congestion,  stuffiness, runny nose, sore throat, diarrhea,  nausea, vomiting, cold or Flu symptoms, recent or current infections. It is especially important if the infection is over the area that we intend to treat. Heart and lung problems: Symptoms that may suggest an active cardiopulmonary problem include: cough, chest pain, breathing difficulties or shortness of breath, dizziness, ankle swelling, uncontrolled high or unusually low blood pressure, and/or palpitations. If you are experiencing any of these symptoms, cancel your procedure and contact your primary care physician for an evaluation.  Remember:  Regular Business hours are:  Monday to Thursday 8:00 AM to 4:00 PM  Provider's Schedule: Milinda Pointer, MD:  Procedure days: Tuesday and Thursday 7:30 AM to 4:00 PM  Gillis Santa, MD:  Procedure days: Monday and Wednesday 7:30 AM to 4:00 PM ______________________________________________________________________   ____________________________________________________________________________________________  Medication Rules  Purpose: To inform patients, and their family members, of our rules and regulations.  Applies to: All patients receiving prescriptions (written or electronic).  Pharmacy of record: Pharmacy where electronic prescriptions will be sent. If written prescriptions are taken to a different pharmacy, please inform the nursing staff. The pharmacy listed in the electronic medical record should be the one where you would like electronic prescriptions to be sent.  Electronic prescriptions: In compliance with the [place] (STOP) Act of 2017 (Session Lanny Cramp [phone]), effective March 04, 2018, all controlled substances must be electronically prescribed. Calling prescriptions to the pharmacy will cease to exist.  Prescription refills: Only during scheduled appointments. Applies to all prescriptions.  NOTE: The following applies primarily to controlled substances (Opioid* Pain Medications).    Type of encounter (visit): For patients receiving controlled substances, face-to-face visits are required. (Not an option or up to the patient.)  Patient's responsibilities: Pain Pills: Bring all pain pills to every appointment (except for procedure appointments). Pill Bottles: Bring pills in original pharmacy bottle. Always bring the newest bottle. Bring bottle, even if empty. Medication refills: You are responsible for knowing and keeping track of what medications you take and those you need refilled. The day before your appointment: write a list of all prescriptions that need to be refilled. The day of the appointment: give the list to the admitting nurse. Prescriptions will be written only during appointments. No prescriptions will be written on procedure days. If you forget a medication: it will not be "Called in", "Faxed", or "electronically sent". You will need to get another appointment to get these prescribed. No early refills. Do not call asking to have your prescription filled early. Prescription Accuracy: You are responsible for carefully inspecting your prescriptions before leaving our office. Have the discharge nurse carefully go over each prescription with you, before taking them home. Make sure that your name is accurately spelled, that your address is correct. Check the name and dose of your medication to make sure it is accurate. Check the number of pills, and the written instructions to make sure they are clear and accurate. Make sure that you are given enough medication to last until your next medication refill appointment. Taking Medication: Take medication as prescribed. When it comes to controlled substances, taking less pills or less frequently than prescribed is permitted and encouraged. Never take more pills than instructed. Never take medication more frequently than prescribed.  Inform other Doctors: Always inform, all of your healthcare providers, of all the medications  you take. Pain Medication from other Providers: You are not allowed to accept any additional pain medication from any other Doctor or Healthcare  provider. There are two exceptions to this rule. (see below) In the event that you require additional pain medication, you are responsible for notifying us, as stated below. Cough Medicine: Often these contain an opioid, such as codeine or hydrocodone. Never accept or take cough medicine containing these opioids if you are already taking an opioid* medication. The combination may cause respiratory failure and death. Medication Agreement: You are responsible for carefully reading and following our Medication Agreement. This must be signed before receiving any prescriptions from our practice. Safely store a copy of your signed Agreement. Violations to the Agreement will result in no further prescriptions. (Additional copies of our Medication Agreement are available upon request.) Laws, Rules, & Regulations: All patients are expected to follow all Federal and [employer], Rules, [employer] [facility]. Ignorance of the Laws does not constitute a valid excuse.  Illegal drugs and Controlled Substances: The use of illegal substances (including, but not limited to marijuana and its derivatives) and/or the illegal use of any controlled substances is strictly prohibited. Violation of this rule may result in the immediate and permanent discontinuation of any and all prescriptions being written by our practice. The use of any illegal substances is prohibited. Adopted CDC guidelines & recommendations: Target dosing levels will be at or below 60 MME/day. Use of benzodiazepines** is not recommended.  Exceptions: There are only two exceptions to the rule of not receiving pain medications from other Healthcare Providers. Exception #1 (Emergencies): In the event of an emergency (i.e.: accident requiring emergency care), you are allowed to receive additional pain medication.  However, you are responsible for: As soon as you are able, call our office [phone], at any time of the day or night, and leave a message stating your name, the date and nature of the emergency, and the name and dose of the medication prescribed. In the event that your call is answered by a member of our staff, make sure to document and save the date, time, and the name of the person that took your information.  Exception #2 (Planned Surgery): In the event that you are scheduled by another doctor or dentist to have any type of surgery or procedure, you are allowed (for a period no longer than 30 days), to receive additional pain medication, for the acute post-op pain. However, in this case, you are responsible for picking up a copy of our "Post-op Pain Management for Surgeons" handout, and giving it to your surgeon or dentist. This document is available at our office, and does not require an appointment to obtain it. Simply go to our office during business hours (Monday-Thursday from 8:00 AM to 4:00 PM) (Friday 8:00 AM to 12:00 Noon) or if you have a scheduled appointment with [place], prior to your surgery, and ask for it by name. In addition, you are responsible for: calling our office [phone], at any time of the day or night, and leaving a message stating your name, name of your surgeon, type of surgery, and date of procedure or surgery. Failure to comply with your responsibilities may result in termination of therapy involving the controlled substances.  *Opioid medications include: morphine, codeine, oxycodone, oxymorphone, hydrocodone, hydromorphone, meperidine, tramadol, tapentadol, buprenorphine, fentanyl, methadone. **Benzodiazepine medications include: diazepam (Valium), alprazolam (Xanax), clonazepam (Klonopine), lorazepam (Ativan), clorazepate (Tranxene), chlordiazepoxide (Librium), estazolam (Prosom), oxazepam (Serax), temazepam (Restoril), triazolam (Halcion) (Last updated:  01/31/2020) ____________________________________________________________________________________________  ____________________________________________________________________________________________  Medication Recommendations and Reminders  Applies to: All patients receiving prescriptions (written and/or electronic).  Medication Rules &  Regulations: These rules and regulations exist for your safety and that of others. They are not flexible and neither are we. Dismissing or ignoring them will be considered "non-compliance" with medication therapy, resulting in complete and irreversible termination of such therapy. (See document titled "Medication Rules" for more details.) In all conscience, because of safety reasons, we cannot continue providing a therapy where the patient does not follow instructions.  Pharmacy of record:  Definition: This is the pharmacy where your electronic prescriptions will be sent.  We do not endorse any particular pharmacy, however, we have experienced problems with [facility] not securing enough medication supply for the community. We do not restrict you in your choice of pharmacy. However, once we write for your prescriptions, we will NOT be re-sending more prescriptions to fix restricted supply problems created by your pharmacy, or your insurance.  The pharmacy listed in the electronic medical record should be the one where you want electronic prescriptions to be sent. If you choose to change pharmacy, simply notify our nursing staff.  Recommendations: Keep all of your pain medications in a safe place, under lock and key, even if you live alone. We will NOT replace lost, stolen, or damaged medication. After you fill your prescription, take 1 week's worth of pills and put them away in a safe place. You should keep a separate, properly labeled bottle for this purpose. The remainder should be kept in the original bottle. Use this as your primary supply, until it runs out.  Once it's gone, then you know that you have 1 week's worth of medicine, and it is time to come in for a prescription refill. If you do this correctly, it is unlikely that you will ever run out of medicine. To make sure that the above recommendation works, it is very important that you make sure your medication refill appointments are scheduled at least 1 week before you run out of medicine. To do this in an effective manner, make sure that you do not leave the office without scheduling your next medication management appointment. Always ask the nursing staff to show you in your prescription , when your medication will be running out. Then arrange for the receptionist to get you a return appointment, at least 7 days before you run out of medicine. Do not wait until you have 1 or 2 pills left, to come in. This is very poor planning and does not take into consideration that we may need to cancel appointments due to bad weather, sickness, or emergencies affecting our staff. DO NOT ACCEPT A "Partial Fill": If for any reason your pharmacy does not have enough pills/tablets to completely fill or refill your prescription, do not allow for a "partial fill". The law allows the pharmacy to complete that prescription within 72 hours, without requiring a new prescription. If they do not fill the rest of your prescription within those 72 hours, you will need a separate prescription to fill the remaining amount, which we will NOT provide. If the reason for the partial fill is your insurance, you will need to talk to the pharmacist about payment alternatives for the remaining tablets, but again, DO NOT ACCEPT A PARTIAL FILL, unless you can trust your pharmacist to obtain the remainder of the pills within 72 hours.  Prescription refills and/or changes in medication(s):  Prescription refills, and/or changes in dose or medication, will be conducted only during scheduled medication management appointments. (Applies to both,  written and electronic prescriptions.) No refills on procedure days.  No medication will be changed or started on procedure days. No changes, adjustments, and/or refills will be conducted on a procedure day. Doing so will interfere with the diagnostic portion of the procedure. No phone refills. No medications will be "called into the pharmacy". No Fax refills. No weekend refills. No Holliday refills. No after hours refills.  Remember:  Business hours are:  Monday to Thursday 8:00 AM to 4:00 PM Provider's Schedule: Milinda Pointer, MD - Appointments are:  Medication management: Monday and Wednesday 8:00 AM to 4:00 PM Procedure day: Tuesday and Thursday 7:30 AM to 4:00 PM Gillis Santa, MD - Appointments are:  Medication management: Tuesday and Thursday 8:00 AM to 4:00 PM Procedure day: Monday and Wednesday 7:30 AM to 4:00 PM (Last update: 09/22/2019) ____________________________________________________________________________________________  ____________________________________________________________________________________________  CBD (cannabidiol) WARNING  Applicable to: All individuals currently taking or considering taking CBD (cannabidiol) and, more important, all patients taking opioid analgesic controlled substances (pain medication). (Example: oxycodone; oxymorphone; hydrocodone; hydromorphone; morphine; methadone; tramadol; tapentadol; fentanyl; buprenorphine; butorphanol; dextromethorphan; meperidine; codeine; etc.)  Legal status: CBD remains a Schedule I drug prohibited for any use. CBD is illegal with one exception. In the [place], CBD has a limited Transport planner (FDA) approval for the treatment of two specific types of epilepsy disorders. Only one CBD product has been approved by the FDA for this purpose: "Epidiolex". FDA is aware that some companies are marketing products containing cannabis and cannabis-derived compounds in ways that violate the [employer] Drug and Cosmetic Act [facility] Act) and that may put the health and safety of consumers at risk. The FDA, a Federal agency, has not enforced the CBD status since 2018.   Legality: Some manufacturers ship CBD products nationally, which is illegal. Often such products are sold online and are therefore available throughout the country. CBD is openly sold in head shops and health food stores in some states where such sales have not been explicitly legalized. Selling unapproved products with unsubstantiated therapeutic claims is not only a violation of the law, but also can put patients at risk, as these products have not been proven to be safe or effective. Federal illegality makes it difficult to conduct research on CBD.  Reference: "FDA Regulation of Cannabis and Cannabis-Derived Products, Including Cannabidiol (CBD)" - [employer]  Warning: CBD is not FDA approved and has not undergo the same manufacturing controls as prescription drugs.  This means that the purity and safety of available CBD may be questionable. Most of the time, despite manufacturer's claims, it is contaminated with THC (delta-9-tetrahydrocannabinol - the chemical in marijuana responsible for the "HIGH").  When this is the case, the [facility] contaminant will trigger a positive urine drug screen (UDS) test for Marijuana (carboxy-THC). Because a positive UDS for any illicit substance is a violation of our medication agreement, your opioid analgesics (pain medicine) may be permanently discontinued.  MORE ABOUT CBD  General Information: CBD  is a derivative of the Marijuana (cannabis sativa) plant discovered in 70. It is one of the 113 identified substances found in Marijuana. It accounts for up to 40% of the plant's extract. As of 2018, preliminary clinical studies on CBD included research for the treatment of anxiety,  movement disorders, and pain. CBD is available and consumed in multiple forms, including inhalation of smoke or vapor, as an aerosol spray, and by mouth. It may be supplied as an oil containing CBD, capsules, dried cannabis, or as a liquid solution. CBD is thought not to be as psychoactive  as THC (delta-9-tetrahydrocannabinol - the chemical in marijuana responsible for the "HIGH"). Studies suggest that CBD may interact with different biological target receptors in the body, including cannabinoid and other neurotransmitter receptors. As of 2018 the mechanism of action for its biological effects has not been determined.  Side-effects  Adverse reactions: Dry mouth, diarrhea, decreased appetite, fatigue, drowsiness, malaise, weakness, sleep disturbances, and others.  Drug interactions: CBC may interact with other medications such as blood-thinners. (Last update: 10/09/2019) ____________________________________________________________________________________________  ____________________________________________________________________________________________  Drug Holidays (Slow)  What is a "Drug Holiday"? Drug Holiday: is the name given to the period of time during which a patient stops taking a medication(s) for the purpose of eliminating tolerance to the drug.  Benefits Improved effectiveness of opioids. Decreased opioid dose needed to achieve benefits. Improved pain with lesser dose.  What is tolerance? Tolerance: is the progressive decreased in effectiveness of a drug due to its repetitive use. With repetitive use, the body gets use to the medication and as a consequence, it loses its effectiveness. This is a common problem seen with opioid pain medications. As a result, a larger dose of the drug is needed to achieve the same effect that used to be obtained with a smaller dose.  How long should a "Drug Holiday" last? You should stay off of the pain medicine for at least 14 consecutive days.  (2 weeks)  Should I stop the medicine "cold [place]"? No. You should always coordinate with your Pain Specialist so that he/she can provide you with the correct medication dose to make the transition as smoothly as possible.  How do I stop the medicine? Slowly. You will be instructed to decrease the daily amount of pills that you take by one (1) pill every seven (7) days. This is called a "slow downward taper" of your dose. For example: if you normally take four (4) pills per day, you will be asked to drop this dose to three (3) pills per day for seven (7) days, then to two (2) pills per day for seven (7) days, then to one (1) per day for seven (7) days, and at the end of those last seven (7) days, this is when the "Drug Holiday" would start.   Will I have withdrawals? By doing a "slow downward taper" like this one, it is unlikely that you will experience any significant withdrawal symptoms. Typically, what triggers withdrawals is the sudden stop of a high dose opioid therapy. Withdrawals can usually be avoided by slowly decreasing the dose over a prolonged period of time. If you do not follow these instructions and decide to stop your medication abruptly, withdrawals may be possible.  What are withdrawals? Withdrawals: refers to the wide range of symptoms that occur after stopping or dramatically reducing opiate drugs after heavy and prolonged use. Withdrawal symptoms do not occur to patients that use low dose opioids, or those who take the medication sporadically. Contrary to benzodiazepine (example: Valium, Xanax, etc.) or alcohol withdrawals ("Delirium Tremens"), opioid withdrawals are not lethal. Withdrawals are the physical manifestation of the body getting rid of the excess receptors.  Expected Symptoms Early symptoms of withdrawal may include: Agitation Anxiety Muscle aches Increased tearing Insomnia Runny nose Sweating Yawning  Late symptoms of withdrawal may include: Abdominal  cramping Diarrhea Dilated pupils Goose bumps Nausea Vomiting  Will I experience withdrawals? Due to the slow nature of the taper, it is very unlikely that you will experience any.  What is a slow taper? Taper: refers to the gradual  decrease in dose.  (Last update: 09/22/2019) ____________________________________________________________________________________________   Preparing for your procedure (without sedation) Instructions: Oral Intake: Do not eat or drink anything for at least 3 hours prior to your procedure. Transportation: Unless otherwise stated by your physician, you may drive yourself after the procedure. Blood Pressure Medicine: Take your blood pressure medicine with a sip of water the morning of the procedure. Insulin: Take only  of your normal insulin dose. Preventing infections: Shower with an antibacterial soap the morning of your procedure. Build-up your immune system: Take 1000 mg of Vitamin C with every meal (3 times a day) the day prior to your procedure. Pregnancy: If you are pregnant, call and cancel the procedure. Sickness: If you have a cold, fever, or any active infections, call and cancel the procedure. Arrival: You must be in the facility at least 30 minutes prior to your scheduled procedure. Children: Do not bring any children with you. Dress appropriately: Bring dark clothing that you would not mind if they get stained. Valuables: Do not bring any jewelry or valuables. Procedure appointments are reserved for interventional treatments only. No Prescription Refills. No medication changes will be discussed during procedure appointments. No disability issues will be discussed.

## 2020-10-21 LAB — TOXASSURE SELECT 13 (MW), URINE

## 2020-10-24 ENCOUNTER — Ambulatory Visit (INDEPENDENT_AMBULATORY_CARE_PROVIDER_SITE_OTHER): Payer: Self-pay | Admitting: Internal Medicine

## 2020-10-26 ENCOUNTER — Encounter (INDEPENDENT_AMBULATORY_CARE_PROVIDER_SITE_OTHER): Payer: Self-pay | Admitting: Internal Medicine

## 2020-10-26 ENCOUNTER — Ambulatory Visit (INDEPENDENT_AMBULATORY_CARE_PROVIDER_SITE_OTHER): Payer: Self-pay | Admitting: Internal Medicine

## 2020-10-26 ENCOUNTER — Other Ambulatory Visit: Payer: Self-pay

## 2020-10-26 VITALS — BP 132/88 | HR 105 | Temp 98.3°F | Ht 63.0 in | Wt 182.0 lb

## 2020-10-26 DIAGNOSIS — K219 Gastro-esophageal reflux disease without esophagitis: Secondary | ICD-10-CM

## 2020-10-26 DIAGNOSIS — K900 Celiac disease: Secondary | ICD-10-CM

## 2020-10-26 DIAGNOSIS — R634 Abnormal weight loss: Secondary | ICD-10-CM

## 2020-10-26 LAB — COMPREHENSIVE METABOLIC PANEL
AG Ratio: 1.3 (calc) (ref 1.0–2.5)
ALT: 30 U/L — ABNORMAL HIGH (ref 6–29)
AST: 79 U/L — ABNORMAL HIGH (ref 10–35)
Albumin: 3.4 g/dL — ABNORMAL LOW (ref 3.6–5.1)
Alkaline phosphatase (APISO): 198 U/L — ABNORMAL HIGH (ref 37–153)
BUN: 10 mg/dL (ref 7–25)
CO2: 20 mmol/L (ref 20–32)
Calcium: 8.8 mg/dL (ref 8.6–10.4)
Chloride: 105 mmol/L (ref 98–110)
Creat: 0.55 mg/dL (ref 0.50–1.03)
Globulin: 2.6 g/dL (calc) (ref 1.9–3.7)
Glucose, Bld: 118 mg/dL (ref 65–139)
Potassium: 3.6 mmol/L (ref 3.5–5.3)
Sodium: 137 mmol/L (ref 135–146)
Total Bilirubin: 1 mg/dL (ref 0.2–1.2)
Total Protein: 6 g/dL — ABNORMAL LOW (ref 6.1–8.1)

## 2020-10-26 LAB — CBC
HCT: 42.3 % (ref 35.0–45.0)
Hemoglobin: 13.7 g/dL (ref 11.7–15.5)
MCH: 27.7 pg (ref 27.0–33.0)
MCHC: 32.4 g/dL (ref 32.0–36.0)
MCV: 85.5 fL (ref 80.0–100.0)
MPV: 13.4 fL — ABNORMAL HIGH (ref 7.5–12.5)
Platelets: 321 10*3/uL (ref 140–400)
RBC: 4.95 10*6/uL (ref 3.80–5.10)
RDW: 16.8 % — ABNORMAL HIGH (ref 11.0–15.0)
WBC: 11.6 10*3/uL — ABNORMAL HIGH (ref 3.8–10.8)

## 2020-10-26 MED ORDER — FAMOTIDINE 40 MG PO TABS: 40.0000 mg | ORAL_TABLET | Freq: Every day | ORAL | 1 refills | Status: AC

## 2020-10-26 MED ORDER — METOCLOPRAMIDE HCL 10 MG PO TABS: 10.0000 mg | ORAL_TABLET | Freq: Three times a day (TID) | ORAL | 2 refills | Status: DC

## 2020-10-26 NOTE — Progress Notes (Signed)
Presenting complaint;  Follow-up for GERD, gastroparesis and celiac disease.  Database and subjective:  Patient is 55 year old Caucasian female who is here for scheduled visit. She has a history of celiac disease diagnosed almost 14 years ago which she had not been compliant.  She was seen in the office on 07/11/2020 when she was found to be acutely ill and dehydrated.  She was evaluated in emergency room and subsequently hospitalized.  GI pathogen panel was negative.  EGD revealed gastritis but H. pylori stains were negative.  She was found to have reactive changes.  Duodenal mucosa was abnormal and biopsy confirmed changes of celiac disease.  She had intraepithelial lymphocytes and marked villous atrophy.  Patient was begun on metronidazole and asked to be compliant with gluten-free diet. She was doing better when she was last seen on 08/17/2020.  She was scheduled for gastric emptying study of metoclopramide but this was not done because of some misunderstanding on her part.  [place] and they were out.  She has not called them back again.  She comes in sitting in a wheelchair accompanied by her Elvera Bicker. She has been having nausea and vomiting for 2 weeks.  She has been very limited diet.  She is primarily on liquids.  Most of the time she is heaving and nothing comes out.  Other times it may be liquid.  No hematemesis.  She has lost 10 pounds since her last visit.  Her sister says weight loss occurred the last 2 weeks.  She is not taking metoclopramide liquid because she does not like the taste.  I am not sure how she went from having a pill to liquid.  Stools are still loose but she has bowel movement daily or every other day.  She denies melena or rectal bleeding.  She remains on oxycodone twice daily for chronic back pain. She is not requiring Maxalt often.  Last dose was 3 weeks ago. Her mother died on 11-Sep-2020.  She had lung carcinoma.  Patient is  having very difficult time for the loss.  Current Medications: Outpatient Encounter Medications as of 10/26/2020  Medication Sig   aspirin 81 MG chewable tablet Chew 1 tablet (81 mg total) by mouth daily.   baclofen (LIORESAL) 20 MG tablet Take by mouth at bedtime.   Cyanocobalamin (VITAMIN B-12 IJ) Inject as directed every 30 (thirty) days.    diazepam (VALIUM) 10 MG tablet Take 1 tablet (10 mg total) by mouth 60 (sixty) minutes before procedure for 1 dose. Take with a sip of water, on an empty stomach. Do not eat anything for 6 hours prior to procedure.   DULoxetine (CYMBALTA) 20 MG capsule TAKE (1) CAPSULE BY MOUTH ONCE DAILY. *TAKE WITH 60MG CAP* (Patient taking differently: Take 20 mg by mouth daily. TAKE (1) CAPSULE BY MOUTH ONCE DAILY. *TAKE WITH 60MG CAP*)   DULoxetine (CYMBALTA) 60 MG capsule TAKE (1) CAPSULE BY MOUTH ONCE DAILY.   famotidine (PEPCID) 40 MG tablet Take 40 mg by mouth at bedtime.   fluticasone (FLONASE) 50 MCG/ACT nasal spray Place 1 spray into both nostrils daily. As needed.   folic acid (FOLVITE) 1 MG tablet Take 1 mg by mouth 2 (two) times a day.   levonorgestrel (MIRENA) 20 MCG/24HR IUD by Intrauterine route.   loperamide (IMODIUM) 2 MG capsule Take 1 capsule (2 mg total) by mouth 3 (three) times daily as needed for diarrhea or loose stools.   nitroGLYCERIN (NITROLINGUAL) 0.4 MG/SPRAY  spray Place 2 sprays under the tongue every 5 (five) minutes x 3 doses as needed.   nystatin (MYCOSTATIN/NYSTOP) powder Apply topically.   ondansetron (ZOFRAN-ODT) 8 MG disintegrating tablet Take 1 tablet (8 mg total) by mouth 3 (three) times daily as needed for nausea.   oxyCODONE (OXY IR/ROXICODONE) 5 MG immediate release tablet Take 1 tablet (5 mg total) by mouth every 8 (eight) hours as needed for severe pain. Must last 30 days.   [START ON 11/17/2020] oxyCODONE (OXY IR/ROXICODONE) 5 MG immediate release tablet Take 1 tablet (5 mg total) by mouth every 8 (eight) hours as needed for  severe pain. Must last 30 days.   [START ON 12/17/2020] oxyCODONE (OXY IR/ROXICODONE) 5 MG immediate release tablet Take 1 tablet (5 mg total) by mouth every 8 (eight) hours as needed for severe pain. Must last 30 days.   pantoprazole (PROTONIX) 40 MG tablet Take 1 tablet (40 mg total) by mouth 2 (two) times daily.   pregabalin (LYRICA) 150 MG capsule Take 150 mg by mouth in the morning and at bedtime.   prochlorperazine (COMPAZINE) 10 MG tablet TAKE (1) TABLET BY MOUTH EVERY SIX HOURS AS NEEDED NAUSEA OR VOMITING.   rizatriptan (MAXALT) 10 MG tablet Take 10 mg by mouth as needed.   torsemide (DEMADEX) 20 MG tablet 20 mg once. As needed.   Vitamin D, Ergocalciferol, (DRISDOL) 1.25 MG (50000 UT) CAPS capsule Take 50,000 Units by mouth every 7 (seven) days. Wednesday   lactobacillus acidophilus (BACID) TABS tablet Take 1 tablet by mouth daily. (Patient not taking: Reported on 10/26/2020)   metoCLOPramide (REGLAN) 5 MG/5ML solution Take 5 mLs (5 mg total) by mouth 2 (two) times daily before a meal. (Patient not taking: Reported on 10/26/2020)   metoprolol tartrate (LOPRESSOR) 25 MG tablet Take 1 tablet (25 mg total) by mouth 2 (two) times daily. (Patient not taking: Reported on 10/26/2020)   No facility-administered encounter medications on file as of 10/26/2020.     Objective: Blood pressure 132/88, pulse (!) 105, temperature 98.3 F (36.8 C), temperature source Oral, height 5' 3"  (1.6 m), weight 182 lb (82.6 kg). Patient is sitting in a wheelchair.  She does not appear to be in any distress. Conjunctiva is pink. Sclera is nonicteric Oropharyngeal mucosa is moist and normal. She does not have lip or tongue tremors. No neck masses or thyromegaly noted. Cardiac exam with regular rhythm normal S1 and S2. No murmur or gallop noted. Lungs are clear to auscultation. Abdomen is full.  Bowel sounds are normal.  On palpation she has mild tenderness in midepigastric region.  No organomegaly or masses. No  LE edema or clubbing noted. No tremors noted to outstretched hands.  Labs/studies Results:   CBC Latest Ref Rng & Units 07/13/2020 07/12/2020 07/11/2020  WBC 4.0 - 10.5 K/uL 8.3 7.1 9.6  Hemoglobin 12.0 - 15.0 g/dL 13.4 12.0 13.8  Hematocrit 36.0 - 46.0 % 40.7 36.0 40.7  Platelets 150 - 400 K/uL 419(H) 427(H) 529(H)    CMP Latest Ref Rng & Units 07/14/2020 07/13/2020 07/12/2020  Glucose 70 - 99 mg/dL 90 94 88  BUN 6 - 20 mg/dL 9 8 7   Creatinine 0.44 - 1.00 mg/dL 0.83 0.77 0.74  Sodium 135 - 145 mmol/L 139 139 140  Potassium 3.5 - 5.1 mmol/L 3.5 2.9(L) 3.5  Chloride 98 - 111 mmol/L 107 111 110  CO2 22 - 32 mmol/L 25 20(L) 23  Calcium 8.9 - 10.3 mg/dL 7.9(L) 7.7(L) 7.6(L)  Total Protein 6.5 -  8.1 g/dL 4.4(L) 4.8(L) 4.3(L)  Total Bilirubin 0.3 - 1.2 mg/dL 0.7 0.9 1.0  Alkaline Phos 38 - 126 U/L 354(H) 404(H) 379(H)  AST 15 - 41 U/L 113(H) 107(H) 101(H)  ALT 0 - 44 U/L 70(H) 76(H) 67(H)    Hepatic Function Latest Ref Rng & Units 07/14/2020 07/13/2020 07/13/2020  Total Protein 6.5 - 8.1 g/dL 4.4(L) - 4.8(L)  Albumin 3.5 - 5.0 g/dL 2.0(L) 2.2(L) 2.3(L)  AST 15 - 41 U/L 113(H) - 107(H)  ALT 0 - 44 U/L 70(H) - 76(H)  Alk Phosphatase 38 - 126 U/L 354(H) - 404(H)  Total Bilirubin 0.3 - 1.2 mg/dL 0.7 - 0.9  Bilirubin, Direct 0.0 - 0.2 mg/dL 0.2 - 0.3(H)      Assessment:  #1.  Recurrent nausea and vomiting felt to be due to diabetic gastroparesis.  She is on gluten-free diet and therefore this is unlikely source of her nausea and vomiting.  She needs to go back on metoclopramide and she needs to [place] an effort [place] because metoclopramide would not be a long-term medication. Depression and loss of loved one unfortunately making disease management difficulty.  #2.  Celiac disease.  She is on a gluten-free diet and she appears to be compliant according to her sister.  #3.  GERD.  She is on PPI and famotidine.  Famotidine prescription will be refilled.  #4.   Weight loss secondary to diminished intake of food.  #5.  History of colonic adenomas.  She is in need of therapeutic colonoscopy but not in any position to be prepped.  #6.  Mildly elevated transaminases felt to be due to fatty liver.  Plan:  New prescription give her metoclopramide 10 mg before each meal. New prescription given for famotidine 40 mg p.o. nightly 90 with 1 refill. Once again patient reminded that she needs to call in-house pharmacy and see if she can obtain domperidone. Patient will go to the lab for CBC and comprehensive chemistry panel. Office visit in 1 month. If nausea and vomiting persists she will need to go to emergency room for IV fluids and evaluation.

## 2020-10-26 NOTE — Telephone Encounter (Signed)
error 

## 2020-10-26 NOTE — Patient Instructions (Addendum)
Physician will call with results of blood tests.  I Please go to ER if nausea and vomiting does not stop

## 2020-10-30 NOTE — Progress Notes (Deleted)
No-show to 10/31/2020 appointment.

## 2020-10-31 ENCOUNTER — Ambulatory Visit: Payer: Self-pay | Admitting: Pain Medicine

## 2020-11-28 ENCOUNTER — Encounter (INDEPENDENT_AMBULATORY_CARE_PROVIDER_SITE_OTHER): Payer: Self-pay | Admitting: Gastroenterology

## 2020-11-28 ENCOUNTER — Ambulatory Visit (INDEPENDENT_AMBULATORY_CARE_PROVIDER_SITE_OTHER): Payer: Self-pay | Admitting: Gastroenterology

## 2020-11-28 ENCOUNTER — Other Ambulatory Visit: Payer: Self-pay

## 2020-11-28 VITALS — BP 94/75 | HR 106 | Temp 98.5°F | Ht 63.0 in | Wt 192.5 lb

## 2020-11-28 DIAGNOSIS — K900 Celiac disease: Secondary | ICD-10-CM

## 2020-11-28 DIAGNOSIS — K219 Gastro-esophageal reflux disease without esophagitis: Secondary | ICD-10-CM

## 2020-11-28 DIAGNOSIS — K3184 Gastroparesis: Secondary | ICD-10-CM

## 2020-11-28 MED ORDER — OMEPRAZOLE 40 MG PO CPDR: 40.0000 mg | DELAYED_RELEASE_CAPSULE | Freq: Every day | ORAL | 3 refills | Status: DC

## 2020-11-28 NOTE — Progress Notes (Signed)
Referring Provider: Vidal Schwalbe, MD Primary Care Physician:  Vidal Schwalbe, MD Primary GI Physician:   Chief Complaint  Patient presents with   Follow-up    Patient here today for a follow up on GERD. She states the GERD is better, she is taking Protonix bid and famotidine Qhs, also taking Reglan 10 mg at lunch and 5 Qhs. She states she has been having signifcant amount of fluid on her and took torsemide 20 mg yesterday. Her bp is low at visit today at 46/75 and feels light headed. She has numbness and tingling across her torso to her knees. Has some N&V and has issues with having a bm.Appetite is decreased.   HPI:   Sharon Lawrence is a 55 y.o. female with past medical history of IDA related to celiac disease, GERD, gastroparesis, anxiety, B15 deficiency, folic acid deficiency, hx of MI in 2019, CAD and ADHD.  Patient presenting today for follow up of GERD, Celiacs and gastroparesis. Last seen in August 2022.  GERD: protonix 71m BID, pepcid 477mnightly currenlty. Stays that reflux symptoms depend on how much nausea/vomiting she is having. She tries to stay seated upright which seems to help more. Some acid regurgitation at night or if she has not been able to eat during the day, also has frequent burning in her stomach. She occasionally takes another dose of pepcid if she is having more reflux. Denies dry cough or hoarse voice, has some sore throat from vomiting.   Celiac:diagnosed 14 years ago, some hx of noncompliance with gluten free diet. hgb 13.7 (10/26/20), she is attempting to maintain a gluten free diet at this time, eating more fruit and mashed potatoes as she has not been able to eat much over the past few weeks due to her nausea and vomiting which seems to be improving.   Gastroparesis: currently taking reglan 1048mt lunch and 5mg72m night. She is still having some nausea and vomiting as well as decreased appetite. States that vomiting is maybe a couple of times per week, down  from all day everyday last week. She has compazine and zofran which she uses as well. States that nausea has improved some, she has good and bad days but more good days currently. She does endorse some abdominal pain at times, reports that it is more mid to lower Left quadrant. Has been eating fruit, cereal, mashed potatoes recently, states that nausea has improved since changing her regimen of reglan.   It was recommended previously for her to try and obtain Domperidone for her gastroparesis management, has been unable to obtain this yet. Gastric emptying study and dexa scan ordered by Dr. RehmLaural GoldenJune, however, these have not been completed yet.   She reports that she had some recent constipation that she was having to strain to have a BM, she reports that she has noticed some bright red bleeding when she bears down on the toilet. She reports that she thinks this is related to her hemorrhoids. States that she took some dulcolax when she was constipated which helped, typically has BM every other day. She reports she is taking oxycodone only at night before bed. She is trying to drink plenty of water. Unable to report if she has any burning/pain or itching in her rectum as she is having a lot of numbness and tingling of her body.  She reports that she has had an increase in peripheral edema which she takes torsemide for, she is also having more tingling  and numbness all over, thinks it is related to her excess fluids.   Last Colonoscopy: (09/26/17)examined portion of ileum normal, - Five small, non-bleeding polyps in the rectum, in the sigmoid colon and at the hepatic flexure.- External hemorrhoids.- No specimens collected. Last Endoscopy:(07/12/20)- Normal hypopharynx. - Normal esophagus. - Z-line regular, 35 cm from the incisors. - Congested and erythematous mucosa in the antrum(mild chronic gastritis and reactive gastropathy) - Mucosal changes in the duodenum. (Chronic duodenitis with  intraepithelial lymphocytosis and moderate focally marked villous blunting consistent with celiacs) comment: very pronounced changes to suggest active celiac disease. ? refractory disease vs non-compliance.  Recommendations:  Due for repeat colonoscopy, however, she is unable to tolerate prep at this time.  Past Medical History:  Diagnosis Date   ADHD (attention deficit hyperactivity disorder)    Anxiety    B12 deficiency 02/07/2011   Back pain    CAD (coronary artery disease)    a. s/p recent STEMI on 09/15/2017 with DES to RCA   Celiac disease    Folic acid deficiency 26/05/3352   Headache    Iron deficiency anemia 02/07/2011   Myocardial infarction ([place]) 09/15/2017    Past Surgical History:  Procedure Laterality Date   BIOPSY  07/12/2020   Procedure: BIOPSY;  Surgeon: Rogene Houston, MD;  [facility]: [facility];  Service: Endoscopy;;  duodenum(2nd part);antral   CHOLECYSTECTOMY     COLONOSCOPY WITH PROPOFOL N/A 09/26/2017   Procedure: COLONOSCOPY WITH PROPOFOL;  Surgeon: Rogene Houston, MD;  Location: [facility];  Service: Endoscopy;  Laterality: N/A;   CORONARY STENT INTERVENTION Right 09/15/2017   Procedure: CORONARY STENT INTERVENTION;  Surgeon: Jettie Booze, MD;  Location: [place];  Service: Cardiovascular;  Laterality: Right;  RCA   ESOPHAGOGASTRODUODENOSCOPY (EGD) WITH PROPOFOL N/A 07/12/2020   Procedure: ESOPHAGOGASTRODUODENOSCOPY (EGD) WITH PROPOFOL;  Surgeon: Rogene Houston, MD;  Location: [facility];  Service: Endoscopy;  Laterality: N/A;   KNEE CARTILAGE SURGERY Right    LEFT HEART CATH AND CORONARY ANGIOGRAPHY N/A 09/15/2017   Procedure: LEFT HEART CATH AND CORONARY ANGIOGRAPHY;  Surgeon: Jettie Booze, MD;  Location: [facility];  Service: Cardiovascular;  Laterality: N/A;   TONSILLECTOMY      Current Outpatient Medications  Medication Sig Dispense Refill   aspirin 81 MG chewable tablet Chew 1 tablet (81 mg total) by mouth  daily.     baclofen (LIORESAL) 20 MG tablet Take by mouth at bedtime.     famotidine (PEPCID) 40 MG tablet Take 1 tablet (40 mg total) by mouth at bedtime. 90 tablet 1   fluticasone (FLONASE) 50 MCG/ACT nasal spray Place 1 spray into both nostrils daily. As needed.     levonorgestrel (MIRENA) 20 MCG/24HR IUD by Intrauterine route.     loperamide (IMODIUM) 2 MG capsule Take 1 capsule (2 mg total) by mouth 3 (three) times daily as needed for diarrhea or loose stools. 15 capsule 0   metoCLOPramide (REGLAN) 10 MG tablet Take 1 tablet (10 mg total) by mouth 3 (three) times daily before meals. 90 tablet 2   nitroGLYCERIN (NITROLINGUAL) 0.4 MG/SPRAY spray Place 2 sprays under the tongue every 5 (five) minutes x 3 doses as needed.     nystatin (MYCOSTATIN/NYSTOP) powder Apply topically.     ondansetron (ZOFRAN-ODT) 8 MG disintegrating tablet Take 1 tablet (8 mg total) by mouth 3 (three) times daily as needed for nausea. 20 tablet 3   oxyCODONE (OXY IR/ROXICODONE) 5 MG immediate release  tablet Take 1 tablet (5 mg total) by mouth every 8 (eight) hours as needed for severe pain. Must last 30 days. 90 tablet 0   [START ON 12/17/2020] oxyCODONE (OXY IR/ROXICODONE) 5 MG immediate release tablet Take 1 tablet (5 mg total) by mouth every 8 (eight) hours as needed for severe pain. Must last 30 days. 90 tablet 0   pantoprazole (PROTONIX) 40 MG tablet Take 1 tablet (40 mg total) by mouth 2 (two) times daily. 60 tablet 2   prochlorperazine (COMPAZINE) 10 MG tablet TAKE (1) TABLET BY MOUTH EVERY SIX HOURS AS NEEDED NAUSEA OR VOMITING. 30 tablet 1   rizatriptan (MAXALT) 10 MG tablet Take 10 mg by mouth as needed.     torsemide (DEMADEX) 20 MG tablet 20 mg once. As needed.  2   Vitamin D, Ergocalciferol, (DRISDOL) 1.25 MG (50000 UT) CAPS capsule Take 50,000 Units by mouth every 7 (seven) days. Wednesday     Cyanocobalamin (VITAMIN B-12 IJ) Inject as directed every 30 (thirty) days.  (Patient not taking: Reported on  11/28/2020)     diazepam (VALIUM) 10 MG tablet Take 1 tablet (10 mg total) by mouth 60 (sixty) minutes before procedure for 1 dose. Take with a sip of water, on an empty stomach. Do not eat anything for 6 hours prior to procedure. (Patient not taking: Reported on 11/28/2020) 1 tablet 0   No current facility-administered medications for this visit.    Allergies as of 11/28/2020 - Review Complete 11/28/2020  Allergen Reaction Noted   Bee venom Anaphylaxis    Erythromycin Anaphylaxis 06/03/2012   Cephalexin  11/17/2017   Neomycin-bacitracin-polymyxin  [bacitracin-neomycin-polymyxin] Swelling 09/09/2018   Neosporin original [bacitracin-neomycin-polymyxin] Swelling 06/03/2012   Prednisone  11/17/2017   Sulfa antibiotics Swelling 06/03/2012   Tobrex [tobramycin] Swelling 09/15/2016   Tramadol Hives 06/22/2013   Penicillins Rash 06/03/2012    Family History  Problem Relation Age of Onset   Cancer Mother    Hypertension Mother    Hypertension Father    Hypothyroidism Father    Cancer Sister    Anxiety disorder Sister    Depression Sister    CAD Maternal Grandfather    Cirrhosis Paternal Grandfather    CAD Paternal Grandmother    Diabetes Paternal Grandmother    Hypothyroidism Paternal Grandmother    Drug abuse Son    Bipolar disorder Son     Social History   Socioeconomic History   Marital status: Widowed    Spouse name: Not on file   Number of children: 3   Years of education: Not on file   Highest education level: Associate degree: occupational, Hotel manager, or vocational program  Occupational History   Not on file  Tobacco Use   Smoking status: Former    Packs/day: 0.25    Years: 12.00    Pack years: 3.00    Types: Cigarettes    Quit date: 04/04/2017    Years since quitting: 3.6   Smokeless tobacco: Never  Vaping Use   Vaping Use: Never used  Substance and Sexual Activity   Alcohol use: No   Drug use: No   Sexual activity: Not Currently  Other Topics Concern   Not  on file  Social History Narrative   Not on file   Social Determinants of Health   Financial Resource Strain: Not on file  Food Insecurity: Not on file  Transportation Needs: Not on file  Physical Activity: Not on file  Stress: Not on file  Social Connections: Not on  file    Review of Systems: Gen: Denies fever, chills, anorexia. Denies fatigue, weakness, weight loss.  CV: Denies chest pain, palpitations, syncope, peripheral edema, and claudication. Resp: Denies dyspnea at rest, cough, wheezing, coughing up blood, and pleurisy. GI: Denies vomiting blood, jaundice, and fecal incontinence. Denies dysphagia or odynophagia. +nausea, +vomiting +abdominal pain +reflux Derm: Denies rash, itching, dry skin Psych: Denies depression, anxiety, memory loss, confusion. No homicidal or suicidal ideation.  Heme: Denies bruising, bleeding, and enlarged lymph nodes.  Physical Exam: BP 94/75 (BP Location: Left Arm, Patient Position: Sitting, Cuff Size: Large)   Pulse (!) 106   Temp 98.5 F (36.9 C) (Oral)   Ht 5' 3"  (1.6 m)   Wt 192 lb 8 oz (87.3 kg)   BMI 34.10 kg/m  General:   Alert and oriented. No distress noted. Pleasant and cooperative.  Head:  Normocephalic and atraumatic. Eyes:  Conjuctiva clear without scleral icterus. Mouth:  Oral mucosa pink and moist. Good dentition. No lesions. Heart: Normal rate and rhythm, s1 and s2 heart sounds present.  Lungs: Clear lung sounds in all lobes. Respirations equal and unlabored. Abdomen:  +BS, soft, non-tender and non-distended. No rebound or guarding. No HSM or masses noted. Derm: No palmar erythema or jaundice Msk:  Symmetrical without gross deformities. Normal posture. Extremities:  non pitting edema present to BLLE Neurologic:  Alert and  oriented x4 Psych:  Alert and cooperative. Normal mood and affect.  Invalid input(s): 6 MONTHS   ASSESSMENT: Sharon Lawrence is a 55 y.o. female presenting today for follow up of GERD, gastroparesis and  celiac disease.  Reflux does not seem very well controlled on BID protonix 28m with pepcid 479mnightly. She sometimes takes a second dose of pepcid, she often feels burning in her stomach. We will stop protonix and start omeprazole 4079mnce daily, continue pepcid 58m21mghtly, can take this BID if needed.  Information given to patient/sister to obtain domperidone 10mg12mch she can take up to TID, she will stop reglan if she is able to obtain this. She is currently on reglan, 10mg 29munch and 5mg at64mdtime, dose was decreased previously by Dr. Rehman Laural Golden presence of tremors. Her nausea has improved quite a bit but she is still experiencing moderately frequent nausea with vomiting episodes maybe twice per week. She is currently taking oxycodone only at night for her pain, I suspect the decrease in opiates has played a role in the improvement of her symptoms.   She should continue to avoid gluten, eat 4-5 smaller meals per day. She can incorporate some protein shakes in as tolerated in order to maintain her nutrients. She should continue to attempt to stay well hydrated. She has had some rectal bleeding occasionally when bearing down/straining to have a BM. Noticed it last when she had an episode of constipation recently. Last hemoglobin in August was 13.7.  She is attempting to establish new pcp as her old pcp retired and they were managing her torsemide.     PLAN:  Omeprazole 58mg  251mntinue pepcid 58mg nig48m, can take BID if needed 3. Domperidone information given to patient/sister who is her caregiver so they can obtain this 4. Can try protein shakes 2-3x/day and 4-5 small meals throughout the day  Follow Up: 3 months  Sareen Randon L. Indiah Heyden, MAlver SorrowRN, AGNP-C Adult-Gerontology Nurse Practitioner [facility]

## 2020-11-28 NOTE — Patient Instructions (Signed)
I have provided the information on how to obtain the Domperidone which would be used in place of your reglan. If you are able to get this you can take one 30m tablet up to 3 times per day, doses need to be 8 hours apart.   Please stop your protonix, I have sent omeprazole 44mfor you to take in the mornings 30-45 mins prior to eating anything or upon waking.  Continue using your zofran and compazine as needed, avoiding gluten as much as possible. You can add some protein shakes in to help with your nutrition if you tolerate these.   Follow up in 3 months

## 2020-12-12 ENCOUNTER — Inpatient Hospital Stay (HOSPITAL_COMMUNITY)
Admission: EM | Admit: 2020-12-12 | Discharge: 2020-12-16 | DRG: 872 | Disposition: A | Discharge status: Home / Self Care | Payer: Self-pay | Attending: Family Medicine | Admitting: Family Medicine

## 2020-12-12 ENCOUNTER — Other Ambulatory Visit: Payer: Self-pay

## 2020-12-12 ENCOUNTER — Emergency Department (HOSPITAL_COMMUNITY): Payer: Self-pay

## 2020-12-12 ENCOUNTER — Encounter (HOSPITAL_COMMUNITY): Payer: Self-pay | Admitting: *Deleted

## 2020-12-12 DIAGNOSIS — E46 Unspecified protein-calorie malnutrition: Secondary | ICD-10-CM

## 2020-12-12 DIAGNOSIS — K3184 Gastroparesis: Secondary | ICD-10-CM | POA: Diagnosis present

## 2020-12-12 DIAGNOSIS — R160 Hepatomegaly, not elsewhere classified: Secondary | ICD-10-CM | POA: Diagnosis present

## 2020-12-12 DIAGNOSIS — R140 Abdominal distension (gaseous): Secondary | ICD-10-CM | POA: Diagnosis present

## 2020-12-12 DIAGNOSIS — Z818 Family history of other mental and behavioral disorders: Secondary | ICD-10-CM

## 2020-12-12 DIAGNOSIS — E872 Acidosis, unspecified: Secondary | ICD-10-CM | POA: Diagnosis present

## 2020-12-12 DIAGNOSIS — Z20822 Contact with and (suspected) exposure to covid-19: Secondary | ICD-10-CM | POA: Diagnosis present

## 2020-12-12 DIAGNOSIS — I2510 Atherosclerotic heart disease of native coronary artery without angina pectoris: Secondary | ICD-10-CM | POA: Diagnosis present

## 2020-12-12 DIAGNOSIS — Z881 Allergy status to other antibiotic agents status: Secondary | ICD-10-CM

## 2020-12-12 DIAGNOSIS — I10 Essential (primary) hypertension: Secondary | ICD-10-CM | POA: Diagnosis present

## 2020-12-12 DIAGNOSIS — Z79891 Long term (current) use of opiate analgesic: Secondary | ICD-10-CM

## 2020-12-12 DIAGNOSIS — Z8719 Personal history of other diseases of the digestive system: Secondary | ICD-10-CM

## 2020-12-12 DIAGNOSIS — D75839 Thrombocytosis, unspecified: Secondary | ICD-10-CM | POA: Diagnosis present

## 2020-12-12 DIAGNOSIS — Z6841 Body Mass Index (BMI) 40.0 and over, adult: Secondary | ICD-10-CM

## 2020-12-12 DIAGNOSIS — R109 Unspecified abdominal pain: Secondary | ICD-10-CM

## 2020-12-12 DIAGNOSIS — K219 Gastro-esophageal reflux disease without esophagitis: Secondary | ICD-10-CM | POA: Diagnosis present

## 2020-12-12 DIAGNOSIS — Z833 Family history of diabetes mellitus: Secondary | ICD-10-CM

## 2020-12-12 DIAGNOSIS — R112 Nausea with vomiting, unspecified: Secondary | ICD-10-CM | POA: Diagnosis present

## 2020-12-12 DIAGNOSIS — G894 Chronic pain syndrome: Secondary | ICD-10-CM | POA: Diagnosis present

## 2020-12-12 DIAGNOSIS — Z8249 Family history of ischemic heart disease and other diseases of the circulatory system: Secondary | ICD-10-CM

## 2020-12-12 DIAGNOSIS — F419 Anxiety disorder, unspecified: Secondary | ICD-10-CM | POA: Diagnosis present

## 2020-12-12 DIAGNOSIS — Z885 Allergy status to narcotic agent status: Secondary | ICD-10-CM

## 2020-12-12 DIAGNOSIS — A419 Sepsis, unspecified organism: Principal | ICD-10-CM

## 2020-12-12 DIAGNOSIS — Z955 Presence of coronary angioplasty implant and graft: Secondary | ICD-10-CM

## 2020-12-12 DIAGNOSIS — Z7982 Long term (current) use of aspirin: Secondary | ICD-10-CM

## 2020-12-12 DIAGNOSIS — E8809 Other disorders of plasma-protein metabolism, not elsewhere classified: Secondary | ICD-10-CM | POA: Diagnosis present

## 2020-12-12 DIAGNOSIS — Z87891 Personal history of nicotine dependence: Secondary | ICD-10-CM

## 2020-12-12 DIAGNOSIS — D72829 Elevated white blood cell count, unspecified: Secondary | ICD-10-CM

## 2020-12-12 DIAGNOSIS — I252 Old myocardial infarction: Secondary | ICD-10-CM

## 2020-12-12 DIAGNOSIS — R000 Tachycardia, unspecified: Secondary | ICD-10-CM | POA: Diagnosis present

## 2020-12-12 DIAGNOSIS — D638 Anemia in other chronic diseases classified elsewhere: Secondary | ICD-10-CM | POA: Diagnosis present

## 2020-12-12 DIAGNOSIS — K900 Celiac disease: Secondary | ICD-10-CM | POA: Diagnosis present

## 2020-12-12 DIAGNOSIS — E871 Hypo-osmolality and hyponatremia: Secondary | ICD-10-CM | POA: Diagnosis present

## 2020-12-12 DIAGNOSIS — N390 Urinary tract infection, site not specified: Secondary | ICD-10-CM | POA: Diagnosis present

## 2020-12-12 DIAGNOSIS — K2980 Duodenitis without bleeding: Secondary | ICD-10-CM | POA: Diagnosis present

## 2020-12-12 DIAGNOSIS — Z880 Allergy status to penicillin: Secondary | ICD-10-CM

## 2020-12-12 DIAGNOSIS — R1084 Generalized abdominal pain: Secondary | ICD-10-CM

## 2020-12-12 DIAGNOSIS — E440 Moderate protein-calorie malnutrition: Secondary | ICD-10-CM | POA: Diagnosis present

## 2020-12-12 DIAGNOSIS — Z882 Allergy status to sulfonamides status: Secondary | ICD-10-CM

## 2020-12-12 DIAGNOSIS — F909 Attention-deficit hyperactivity disorder, unspecified type: Secondary | ICD-10-CM | POA: Diagnosis present

## 2020-12-12 DIAGNOSIS — Z888 Allergy status to other drugs, medicaments and biological substances status: Secondary | ICD-10-CM

## 2020-12-12 DIAGNOSIS — R6520 Severe sepsis without septic shock: Secondary | ICD-10-CM | POA: Diagnosis present

## 2020-12-12 DIAGNOSIS — R7401 Elevation of levels of liver transaminase levels: Secondary | ICD-10-CM

## 2020-12-12 DIAGNOSIS — K760 Fatty (change of) liver, not elsewhere classified: Secondary | ICD-10-CM | POA: Diagnosis present

## 2020-12-12 DIAGNOSIS — Z79899 Other long term (current) drug therapy: Secondary | ICD-10-CM

## 2020-12-12 LAB — COMPREHENSIVE METABOLIC PANEL
ALT: 34 U/L (ref 0–44)
AST: 65 U/L — ABNORMAL HIGH (ref 15–41)
Albumin: 2.6 g/dL — ABNORMAL LOW (ref 3.5–5.0)
Alkaline Phosphatase: 237 U/L — ABNORMAL HIGH (ref 38–126)
Anion gap: 13 (ref 5–15)
BUN: 8 mg/dL (ref 6–20)
CO2: 21 mmol/L — ABNORMAL LOW (ref 22–32)
Calcium: 7.8 mg/dL — ABNORMAL LOW (ref 8.9–10.3)
Chloride: 99 mmol/L (ref 98–111)
Creatinine, Ser: 0.69 mg/dL (ref 0.44–1.00)
GFR, Estimated: >60 mL/min (ref >60)
Glucose, Bld: 75 mg/dL (ref 70–99)
Potassium: 3.9 mmol/L (ref 3.5–5.1)
Sodium: 133 mmol/L — ABNORMAL LOW (ref 135–145)
Total Bilirubin: 1.3 mg/dL — ABNORMAL HIGH (ref 0.3–1.2)
Total Protein: 6.2 g/dL — ABNORMAL LOW (ref 6.5–8.1)

## 2020-12-12 LAB — URINALYSIS, ROUTINE W REFLEX MICROSCOPIC
Bilirubin Urine: NEGATIVE
Glucose, UA: NEGATIVE mg/dL
Ketones, ur: NEGATIVE mg/dL
Nitrite: POSITIVE — AB
Protein, ur: 100 mg/dL — AB
Specific Gravity, Urine: 1.017 (ref 1.005–1.030)
WBC, UA: >50 WBC/hpf — ABNORMAL HIGH (ref 0–5)
pH: 5 (ref 5.0–8.0)

## 2020-12-12 LAB — CBC WITH DIFFERENTIAL/PLATELET
Abs Immature Granulocytes: 0.19 10*3/uL — ABNORMAL HIGH (ref 0.00–0.07)
Basophils Absolute: 0.1 10*3/uL (ref 0.0–0.1)
Basophils Relative: 0 %
Eosinophils Absolute: 0 10*3/uL (ref 0.0–0.5)
Eosinophils Relative: 0 %
HCT: 35.6 % — ABNORMAL LOW (ref 36.0–46.0)
Hemoglobin: 11.2 g/dL — ABNORMAL LOW (ref 12.0–15.0)
Immature Granulocytes: 1 %
Lymphocytes Relative: 22 %
Lymphs Abs: 3.9 10*3/uL (ref 0.7–4.0)
MCH: 29.5 pg (ref 26.0–34.0)
MCHC: 31.5 g/dL (ref 30.0–36.0)
MCV: 93.7 fL (ref 80.0–100.0)
Monocytes Absolute: 1.3 10*3/uL — ABNORMAL HIGH (ref 0.1–1.0)
Monocytes Relative: 7 %
Neutro Abs: 12.7 10*3/uL — ABNORMAL HIGH (ref 1.7–7.7)
Neutrophils Relative %: 70 %
Platelets: 561 10*3/uL — ABNORMAL HIGH (ref 150–400)
RBC: 3.8 MIL/uL — ABNORMAL LOW (ref 3.87–5.11)
RDW: 24.3 % — ABNORMAL HIGH (ref 11.5–15.5)
WBC: 18.2 10*3/uL — ABNORMAL HIGH (ref 4.0–10.5)
nRBC: 0 % (ref 0.0–0.2)

## 2020-12-12 LAB — RESP PANEL BY RT-PCR (FLU A&B, COVID) ARPGX2
Influenza A by PCR: NEGATIVE
Influenza B by PCR: NEGATIVE
SARS Coronavirus 2 by RT PCR: NEGATIVE

## 2020-12-12 LAB — LIPASE, BLOOD: Lipase: 16 U/L (ref 11–51)

## 2020-12-12 LAB — LACTIC ACID, PLASMA
Lactic Acid, Venous: 3.2 mmol/L (ref 0.5–1.9)
Lactic Acid, Venous: 4.5 mmol/L (ref 0.5–1.9)
Lactic Acid, Venous: 3.7 mmol/L (ref 0.5–1.9)

## 2020-12-12 MED ORDER — ONDANSETRON HCL 4 MG/2ML IJ SOLN
4.0000 mg | Freq: Four times a day (QID) | INTRAMUSCULAR | Status: DC | PRN
  Administered 2020-12-12 – 2020-12-15 (×4): 4 mg via INTRAVENOUS
  Filled 2020-12-12 (×4): qty 2

## 2020-12-12 MED ORDER — LACTATED RINGERS IV BOLUS (SEPSIS): 1000.0000 mL | Freq: Once | INTRAVENOUS | Status: DC

## 2020-12-12 MED ORDER — IBUPROFEN 800 MG PO TABS
800.0000 mg | ORAL_TABLET | Freq: Once | ORAL | Status: AC
  Administered 2020-12-12: 800 mg via ORAL
  Filled 2020-12-12: qty 1

## 2020-12-12 MED ORDER — LEVOFLOXACIN IN D5W 750 MG/150ML IV SOLN
750.0000 mg | INTRAVENOUS | Status: DC
  Administered 2020-12-13 – 2020-12-15 (×3): 750 mg via INTRAVENOUS
  Filled 2020-12-12 (×3): qty 150

## 2020-12-12 MED ORDER — SODIUM CHLORIDE 0.9 % IV BOLUS (SEPSIS)
1000.0000 mL | Freq: Once | INTRAVENOUS | Status: AC
  Administered 2020-12-12: 1000 mL via INTRAVENOUS

## 2020-12-12 MED ORDER — METRONIDAZOLE 500 MG/100ML IV SOLN
500.0000 mg | Freq: Once | INTRAVENOUS | Status: AC
  Administered 2020-12-12: 500 mg via INTRAVENOUS
  Filled 2020-12-12: qty 100

## 2020-12-12 MED ORDER — ONDANSETRON HCL 4 MG PO TABS
4.0000 mg | ORAL_TABLET | Freq: Four times a day (QID) | ORAL | Status: DC | PRN
Start: 2020-12-12 — End: 2020-12-13

## 2020-12-12 MED ORDER — IOHEXOL 300 MG/ML  SOLN
100.0000 mL | Freq: Once | INTRAMUSCULAR | Status: AC | PRN
  Administered 2020-12-12: 100 mL via INTRAVENOUS

## 2020-12-12 MED ORDER — LEVOFLOXACIN IN D5W 750 MG/150ML IV SOLN
750.0000 mg | Freq: Once | INTRAVENOUS | Status: AC
  Administered 2020-12-12: 750 mg via INTRAVENOUS
  Filled 2020-12-12: qty 150

## 2020-12-12 MED ORDER — ACETAMINOPHEN 325 MG PO TABS: 650.0000 mg | ORAL_TABLET | Freq: Four times a day (QID) | ORAL | Status: DC | PRN

## 2020-12-12 MED ORDER — ACETAMINOPHEN 650 MG RE SUPP: 650.0000 mg | Freq: Four times a day (QID) | RECTAL | Status: DC | PRN

## 2020-12-12 MED ORDER — LACTATED RINGERS IV SOLN: INTRAVENOUS | Status: DC

## 2020-12-12 MED ORDER — SODIUM CHLORIDE 0.9 % IV SOLN
1000.0000 mL | INTRAVENOUS | Status: DC
  Administered 2020-12-12 – 2020-12-13 (×2): 1000 mL via INTRAVENOUS

## 2020-12-12 MED ORDER — ENOXAPARIN SODIUM 40 MG/0.4ML IJ SOSY
40.0000 mg | PREFILLED_SYRINGE | INTRAMUSCULAR | Status: DC
  Administered 2020-12-12 – 2020-12-15 (×4): 40 mg via SUBCUTANEOUS
  Filled 2020-12-12 (×4): qty 0.4

## 2020-12-12 NOTE — ED Provider Notes (Signed)
Emergency Medicine Provider Triage Evaluation Note  Sharon Lawrence , a 55 y.o. female past medical history of chronic pain syndrome, celiac disease and gastroparesis, coronary artery disease and iron deficient anemia was evaluated in triage.  Pt complains of "squeezing" pain throughout her abdomen with numbness from her abdomen down to her bilateral thighs.  She reports abdominal pain symptoms x3 months with nausea vomiting and decreased appetite for 3-4 days.  States that she is unable to tolerate any food or liquids or even her daily medications.  She states that she feels as though her abdomen is swollen.  Family member who is also at patient's bedside reports decreased activity and she is concerned about blood clots and congestive heart failure.  Patient denies history of CHF.  She has been seen and treated by Dr. Laural Golden for her abdominal pain.  She denies fever, diarrhea, chest pain   Review of Systems  Positive: "Squeezing" Abdominal pain, nausea and vomiting Negative: Chest pain, shortness of breath, fever  Physical Exam  BP 91/63 (BP Location: Left Arm)   Pulse (!) 107   Temp 98.2 F (36.8 C) (Oral)   Resp 20   SpO2 100%  Gen:   Awake, no distress   Resp:  Normal effort MSK:   Moves extremities without difficulty, pitting edema of bilateral lower extremities Other:  Diffuse tenderness to palpation of the upper abdomen.  Medical Decision Making  Medically screening exam initiated at 3:22 PM.  Appropriate orders placed.  Tyneshia K Frommelt was informed that the remainder of the evaluation will be completed by another provider, this initial triage assessment does not replace that evaluation, and the importance of remaining in the ED until their evaluation is complete.  Patient here with 35-monthhistory of abdominal pain with nausea vomiting x3 days.  She will need further evaluation in the emergency department and likely CT of the abdomen.  She is agreeable to plan.   TKem Parkinson  PA-C 12/13/20 1400    ZMilton Ferguson MD 12/19/20 1654

## 2020-12-12 NOTE — Progress Notes (Addendum)
Pharmacy Antibiotic Note  Sharon Lawrence is a 55 y.o. female admitted on 12/12/2020 with  intra-abdominal infection .  Pharmacy has been consulted for Levaquin dosing.  Plan: Levaquin 727m IV q24h Also on Flagyl 5030mIV q12h F/U cxs and clinical progress Monitor V/S, labs    Temp (24hrs), Avg:98.2 F (36.8 C), Min:98.2 F (36.8 C), Max:98.2 F (36.8 C)  Recent Labs  Lab 12/12/20 1532 12/12/20 1538  WBC  --  18.2*  CREATININE 0.69  --   LATICACIDVEN  --  3.2*    CrCl cannot be calculated (Unknown ideal weight.).  Scr 0.68 CrCl 8339mmin Allergies  Allergen Reactions   Bee Venom Anaphylaxis   Erythromycin Anaphylaxis   Cephalexin    Neomycin-Bacitracin-Polymyxin  [Bacitracin-Neomycin-Polymyxin] Swelling    Sight of application   Neosporin Original [Bacitracin-Neomycin-Polymyxin] Swelling    Swelling at site of application   Prednisone    Sulfa Antibiotics Swelling    Sulfa eye drops caused eyes to swell Sulfa eye drops caused eyes to swell   Tobrex [Tobramycin] Swelling   Tramadol Hives   Penicillins Rash    Has patient had a PCN reaction causing immediate rash, facial/tongue/throat swelling, SOB or lightheadedness with hypotension: Yes Has patient had a PCN reaction causing severe rash involving mucus membranes or skin necrosis: No Has patient had a PCN reaction that required hospitalization: No Has patient had a PCN reaction occurring within the last 10 years: No If all of the above answers are "NO", then may proceed with Cephalosporin use.     Antimicrobials this admission: Levaquin 10/11 >>  Flagyl  10/11 >>   Microbiology results: 10/11 BCx: pending   Thank you for allowing pharmacy to be a part of this patient's care. LorIsac SarnaS PhaVena AustriaCPCaliforniainical Pharmacist Pager #[phone] 4062/01/2021 5:14 PM

## 2020-12-12 NOTE — ED Notes (Signed)
Pt eating dinner at this time- pt father at bedside.

## 2020-12-12 NOTE — ED Provider Notes (Signed)
[facility] EMERGENCY DEPARTMENT Provider Note   CSN: [id number] Arrival date & time: 12/12/20  1341     History Chief Complaint  Patient presents with   Fever    Sharon Lawrence is a 55 y.o. female.   Fever Associated symptoms: nausea and vomiting   Associated symptoms: no chest pain, no confusion, no cough, no dysuria and no sore throat        Sharon Lawrence is a 55 y.o. female with past medical history of coronary artery disease, celiac disease, chronic pain disorder hypertension, gastric reflux and iron deficient  anemia who presents to the Emergency Department complaining of generalized abdominal pain x3 months.  Nausea, vomiting, x3 days.  She describes abdominal pain as a "squeezing" throughout her abdomen states that she feels "bloated."  Abdominal pain is associated with a feeling of numbness from just below her breast down to the level of her lower thighs.  Pain is constant but waxes and waning in intensity.  Vomiting and persistent nausea x3 days that is unrelieved with her narcotic pain medication or Antiemetic.  She states that she is unable to tolerate any food or limited amounts of liquids without vomiting.  Unable to take her daily medications as well.  She reports feeling weak all over. She reports fever at home.  She denies chest pain, cough, sore throat, shortness of breath.  Past Medical History:  Diagnosis Date   ADHD (attention deficit hyperactivity disorder)    Anxiety    B12 deficiency 02/07/2011   Back pain    CAD (coronary artery disease)    a. s/p recent STEMI on 09/15/2017 with DES to RCA   Celiac disease    Folic acid deficiency 32/06/4008   Headache    Iron deficiency anemia 02/07/2011   Myocardial infarction ([place]) 09/15/2017    Patient Active Problem List   Diagnosis Date Noted   Gastroparesis 11/28/2020   Weight loss    Transaminitis    Nausea and vomiting 07/12/2020   Abnormal LFTs 07/11/2020   Diarrhea 07/11/2020   Intractable nausea and  vomiting 07/11/2020   Non-pressure chronic ulcer of unspecified part of right lower leg with unspecified severity ([place]) 07/11/2020   Chronic venous hypertension (idiopathic) with ulcer and inflammation of right lower extremity (CODE) ([place]) 07/11/2020   Chronic use of opiate for therapeutic purpose 27/25/3664   Uncomplicated opioid dependence ([place]) 06/13/2020   Chest pain 06/06/2020   Benign neoplasm of colon 02/14/2020   Epigastric pain 02/14/2020   Internal derangement of right knee 02/14/2020   Moderate persistent asthma with (acute) exacerbation 02/14/2020   Other vitamin B12 deficiency anemias 02/14/2020   Adjustment disorder 02/14/2020   Panic attack 02/14/2020   Spondylosis without myelopathy or radiculopathy, lumbosacral region 12/09/2019   Chronic low back pain (Bilateral) w/o sciatica 12/09/2019   Lumbar facet synovial cyst (L5-S1) (Left) 12/09/2019   Generalized anxiety disorder 10/06/2018   MDD (major depressive disorder), recurrent episode, mild ([place]) 10/06/2018   Thrombocytosis 09/30/2018   Microcytic anemia 09/23/2018   Polyarthralgia 09/09/2018   Abnormal MRI, lumbar spine (07/16/2016) 12/29/2017   DDD (degenerative disc disease), lumbar 12/29/2017   DDD (degenerative disc disease), thoracic 12/29/2017   Lumbar facet arthropathy (Bilateral) 12/29/2017   Lumbar facet syndrome (Bilateral) 12/29/2017   Long term current use of anticoagulant therapy (Plavix) 12/29/2017   Neurogenic pain 12/29/2017   Chronic musculoskeletal pain 12/29/2017   Chronic low back pain (Primary Area of Pain) (Bilateral) (R>L) w/ sciatica (Right) 12/03/2017  Chronic lower extremity pain (Secondary Area of Pain) (Right) 12/03/2017   Chronic sacroiliac joint pain (Bilateral) (L>R) 12/03/2017   Chronic pain syndrome 12/03/2017   Long term current use of opiate analgesic 12/03/2017   Pharmacologic therapy 12/03/2017   Disorder of skeletal system 12/03/2017   Problems influencing health status  12/03/2017   Hypothyroid 11/06/2017   History of ST elevation myocardial infarction (STEMI) 11/06/2017   GI bleed 11/06/2017   Gastroesophageal reflux disease 11/06/2017   Bleeding per rectum 11/06/2017   Bilateral lower extremity edema 11/06/2017   Benign essential hypertension 11/06/2017   Back muscle spasm 11/06/2017   Panic disorder 11/06/2017   Anemia 11/06/2017   Attention deficit hyperactivity disorder, combined type 11/06/2017   Syncope and collapse 11/06/2017   Sessile colonic polyp 11/06/2017   Right-sided low back pain with sciatica 11/06/2017   RAD (reactive airway disease), moderate persistent, uncomplicated 93/81/0175   Other chronic pain 11/06/2017   Migraine 11/06/2017   Major depression, single episode 11/06/2017   Psychophysiologic insomnia 11/06/2017   Iron deficiency anemia 11/06/2017   Hematochezia 09/25/2017   Heart attack ([place]) 09/25/2017   Hypotension 09/25/2017   Morbidly obese ([place]) 09/25/2017   Elevated LFTs 09/25/2017   Chronic diarrhea, with Celiac disease 09/25/2017   Former smoker    Hyperlipidemia 09/17/2017   Acute ST elevation myocardial infarction (STEMI) of inferior wall ([place]) 09/15/2017   Family hx of colon cancer 09/11/2017   N&V (nausea and vomiting) 09/11/2017   Celiac disease 09/11/2017   Influenza vaccination declined 01/01/2017   Vaginal condyloma 06/22/2013   Obesity 06/22/2013   Fatty liver disease, nonalcoholic 12/26/8525   Folic acid deficiency 78/24/2353   Iron deficiency 02/07/2011   B12 deficiency 02/07/2011    Past Surgical History:  Procedure Laterality Date   BIOPSY  07/12/2020   Procedure: BIOPSY;  Surgeon: Rogene Houston, MD;  Location: [facility];  Service: Endoscopy;;  duodenum(2nd part);antral   CHOLECYSTECTOMY     COLONOSCOPY WITH PROPOFOL N/A 09/26/2017   rehman examined portion of ileum normal, - Five small, non-bleeding polyps in the rectum, in the sigmoid colon and at the hepatic   CORONARY STENT  INTERVENTION Right 09/15/2017   Procedure: CORONARY STENT INTERVENTION;  Surgeon: Jettie Booze, MD;  Location: [place];  Service: Cardiovascular;  Laterality: Right;  RCA   ESOPHAGOGASTRODUODENOSCOPY (EGD) WITH PROPOFOL N/A 07/12/2020   rehman: normal hypopharynx, Normal esophagus. z line regular 35 cm from incisors, congested and erythematous mucosa in antrum, mucosal changes in duodenum, very pronounced changes to suggest active celiac disease   KNEE CARTILAGE SURGERY Right    LEFT HEART CATH AND CORONARY ANGIOGRAPHY N/A 09/15/2017   Procedure: LEFT HEART CATH AND CORONARY ANGIOGRAPHY;  Surgeon: Jettie Booze, MD;  Location: [facility];  Service: Cardiovascular;  Laterality: N/A;   TONSILLECTOMY       OB History     Gravida  3   Para  3   Term      Preterm  3   AB      Living  3      SAB      IAB      Ectopic      Multiple      Live Births  3           Family History  Problem Relation Age of Onset   Cancer Mother    Hypertension Mother    Hypertension Father    Hypothyroidism Father  Cancer Sister    Anxiety disorder Sister    Depression Sister    CAD Maternal Grandfather    Cirrhosis Paternal Grandfather    CAD Paternal Grandmother    Diabetes Paternal Grandmother    Hypothyroidism Paternal Grandmother    Drug abuse Son    Bipolar disorder Son     Social History   Tobacco Use   Smoking status: Former    Packs/day: 0.25    Years: 12.00    Pack years: 3.00    Types: Cigarettes    Quit date: 04/04/2017    Years since quitting: 3.6   Smokeless tobacco: Never  Vaping Use   Vaping Use: Never used  Substance Use Topics   Alcohol use: No   Drug use: No    Home Medications Prior to Admission medications   Medication Sig Start Date End Date Taking? Authorizing Provider  aspirin 81 MG chewable tablet Chew 1 tablet (81 mg total) by mouth daily. 09/18/17  Yes Reino Bellis B, NP  baclofen (LIORESAL) 20 MG  tablet Take by mouth at bedtime. 03/30/20  Yes [provider]  famotidine (PEPCID) 40 MG tablet Take 1 tablet (40 mg total) by mouth at bedtime. 10/26/20  Yes Rehman, Mechele Dawley, MD  ibuprofen (ADVIL) 800 MG tablet Take 800 mg by mouth 3 (three) times daily.   Yes [provider]  levonorgestrel (MIRENA) 20 MCG/24HR IUD by Intrauterine route.   Yes [provider]  loperamide (IMODIUM) 2 MG capsule Take 1 capsule (2 mg total) by mouth 3 (three) times daily as needed for diarrhea or loose stools. 07/14/20  Yes Kathie Dike, MD  metoCLOPramide (REGLAN) 10 MG tablet Take 1 tablet (10 mg total) by mouth 3 (three) times daily before meals. 10/26/20  Yes Rehman, Mechele Dawley, MD  nitroGLYCERIN (NITROLINGUAL) 0.4 MG/SPRAY spray Place 2 sprays under the tongue every 5 (five) minutes x 3 doses as needed. 04/03/20  Yes [provider]  nystatin (MYCOSTATIN/NYSTOP) powder Apply topically.   Yes [provider]  omeprazole (PRILOSEC) 40 MG capsule Take 1 capsule (40 mg total) by mouth daily. 11/28/20  Yes Carlan, Chelsea L, NP  ondansetron (ZOFRAN-ODT) 8 MG disintegrating tablet Take 1 tablet (8 mg total) by mouth 3 (three) times daily as needed for nausea. 07/14/20  Yes Kathie Dike, MD  oxyCODONE (OXY IR/ROXICODONE) 5 MG immediate release tablet Take 1 tablet (5 mg total) by mouth every 8 (eight) hours as needed for severe pain. Must last 30 days. 11/17/20 12/17/20 Yes Milinda Pointer, MD  pregabalin (LYRICA) 150 MG capsule Take 150 mg by mouth 2 (two) times daily.   Yes [provider]  prochlorperazine (COMPAZINE) 10 MG tablet TAKE (1) TABLET BY MOUTH EVERY SIX HOURS AS NEEDED NAUSEA OR VOMITING. Patient taking differently: Take 10 mg by mouth every 6 (six) hours as needed for nausea or vomiting. 10/10/20  Yes Rehman, Mechele Dawley, MD  rizatriptan (MAXALT) 10 MG tablet Take 10 mg by mouth as needed. 12/31/19  Yes [provider]  torsemide (DEMADEX) 20 MG  tablet 20 mg once. As needed. 10/17/17  Yes [provider]  Vitamin D, Ergocalciferol, (DRISDOL) 1.25 MG (50000 UT) CAPS capsule Take 50,000 Units by mouth every 7 (seven) days. Wednesday 09/14/18  Yes [provider]  clotrimazole (MYCELEX) 10 MG troche Take 10 mg by mouth 5 (five) times daily. Patient not taking: No sig reported 09/11/20   [provider]  Cyanocobalamin (VITAMIN B-12 IJ) Inject as directed every  30 (thirty) days.  Patient not taking: Reported on 11/28/2020    [provider]  diazepam (VALIUM) 10 MG tablet Take 1 tablet (10 mg total) by mouth 60 (sixty) minutes before procedure for 1 dose. Take with a sip of water, on an empty stomach. Do not eat anything for 6 hours prior to procedure. Patient not taking: No sig reported 09/14/20   Milinda Pointer, MD  fluconazole (DIFLUCAN) 150 MG tablet Take by mouth. Patient not taking: No sig reported 09/11/20   [provider]  fluticasone (FLONASE) 50 MCG/ACT nasal spray Place 1 spray into both nostrils daily. As needed. Patient not taking: Reported on 12/12/2020    [provider]  oxyCODONE (OXY IR/ROXICODONE) 5 MG immediate release tablet Take 1 tablet (5 mg total) by mouth every 8 (eight) hours as needed for severe pain. Must last 30 days. 12/17/20 01/16/21  Milinda Pointer, MD    Allergies    Bee venom, Erythromycin, Cephalexin, Neomycin-bacitracin-polymyxin  [bacitracin-neomycin-polymyxin], Neosporin original [bacitracin-neomycin-polymyxin], Prednisone, Sulfa antibiotics, Tobrex [tobramycin], Tramadol, and Penicillins  Review of Systems   Review of Systems  Constitutional:  Positive for appetite change and fever.  HENT:  Negative for sore throat and trouble swallowing.   Respiratory:  Negative for cough and shortness of breath.   Cardiovascular:  Negative for chest pain.  Gastrointestinal:  Positive for abdominal distention, abdominal pain, nausea and vomiting. Negative  for blood in stool.  Genitourinary:  Negative for difficulty urinating and dysuria.  Musculoskeletal:  Negative for back pain and neck pain.  Skin:  Negative for color change and wound.  Neurological:  Negative for dizziness, syncope, weakness, light-headedness and numbness.  Psychiatric/Behavioral:  Negative for confusion.    Physical Exam Updated Vital Signs BP 91/63 (BP Location: Left Arm)   Pulse (!) 107   Temp 98.2 F (36.8 C) (Oral)   Resp 20   SpO2 100%   Physical Exam Vitals and nursing note reviewed.  Constitutional:      Appearance: Normal appearance. She is not ill-appearing.  HENT:     Mouth/Throat:     Pharynx: Oropharynx is clear.  Cardiovascular:     Rate and Rhythm: Normal rate and regular rhythm.     Pulses: Normal pulses.  Pulmonary:     Effort: Pulmonary effort is normal.  Abdominal:     Palpations: Abdomen is soft.     Tenderness: There is abdominal tenderness.  Musculoskeletal:     Right lower leg: Edema present.     Left lower leg: Edema present.  Skin:    General: Skin is warm.     Capillary Refill: Capillary refill takes less than 2 seconds.  Neurological:     General: No focal deficit present.     Mental Status: She is alert.     Sensory: No sensory deficit.     Motor: No weakness.    ED Results / Procedures / Treatments   Labs (all labs ordered are listed, but only abnormal results are displayed) Labs Reviewed  COMPREHENSIVE METABOLIC PANEL - Abnormal; Notable for the following components:      Result Value   Sodium 133 (*)    CO2 21 (*)    Calcium 7.8 (*)    Total Protein 6.2 (*)    Albumin 2.6 (*)    AST 65 (*)    Alkaline Phosphatase 237 (*)    Total Bilirubin 1.3 (*)    All other components within normal limits  CBC WITH DIFFERENTIAL/PLATELET - Abnormal; Notable  for the following components:   WBC 18.2 (*)    RBC 3.80 (*)    Hemoglobin 11.2 (*)    HCT 35.6 (*)    RDW 24.3 (*)    Platelets 561 (*)    Neutro Abs 12.7 (*)     Monocytes Absolute 1.3 (*)    Abs Immature Granulocytes 0.19 (*)    All other components within normal limits  LACTIC ACID, PLASMA - Abnormal; Notable for the following components:   Lactic Acid, Venous 3.2 (*)    All other components within normal limits  CULTURE, BLOOD (SINGLE)  RESP PANEL BY RT-PCR (FLU A&B, COVID) ARPGX2  LIPASE, BLOOD  URINALYSIS, ROUTINE W REFLEX MICROSCOPIC  LACTIC ACID, PLASMA    EKG None  Radiology CT ABDOMEN PELVIS W CONTRAST  Result Date: 12/12/2020 CLINICAL DATA:  Abdominal pain, acute, nonlocalized. Pt c/o abdominal swelling x 3 months EXAM: CT ABDOMEN AND PELVIS WITH CONTRAST TECHNIQUE: Multidetector CT imaging of the abdomen and pelvis was performed using the standard protocol following bolus administration of intravenous contrast. CONTRAST:  132m OMNIPAQUE IOHEXOL 300 MG/ML  SOLN COMPARISON:  CT abdomen pelvis 06/25/2020, CT abdomen pelvis 06/10/2012 FINDINGS: Lower chest: Left lower lobe linear atelectasis. Hepatobiliary: The liver is enlarged measuring up to 23cm. vague 0.9 cm right posterior hepatic lobe hyperdensity. Similar findings within the lateral abdomen urine left hepatic lobe (2:28) measuring up to 2.5 cm. The hepatic parenchyma is diffusely hypodense compared to the splenic parenchyma consistent with fatty infiltration. No focal liver abnormality. Status post cholecystectomy. No biliary dilatation. Pancreas: No focal lesion. Normal pancreatic contour. No surrounding inflammatory changes. No main pancreatic ductal dilatation. Spleen: Normal in size without focal abnormality. Adrenals/Urinary Tract: Bilateral and renal nodularity appears similar to prior measuring up to 2 cm bilaterally with associated density of 40-50 Hounsfield units. Stomach/Bowel: Stomach is within normal limits. No evidence of bowel wall thickening or dilatation. Appendix appears normal. Vascular/Lymphatic: No abdominal aorta or iliac aneurysm. Mild atherosclerotic plaque of  the aorta and its branches. No abdominal, pelvic, or inguinal lymphadenopathy. Reproductive: T-shaped intrauterine device noted within the uterus in grossly appropriate position within the endometrial canal. Uterus and bilateral adnexa are unremarkable. Other: Trace volume simple free fluid within the abdomen or pelvis. No intraperitoneal free gas. No organized fluid collection. Musculoskeletal: Subcutaneus soft tissue edema. No suspicious lytic or blastic osseous lesions. No acute displaced fracture. Multilevel degenerative changes of the spine. IMPRESSION: 1. Hepatomegaly and hepatic steatosis with several hyperdense lesions. Recommend nonemergent MRI liver protocol further evaluation. When the patient is clinically stable and able to follow directions and hold their breath (preferably as an outpatient) further evaluation with dedicated abdominal MRI should be considered. 2. Interval development of trace simple free fluid ascites. 3. T-shaped intrauterine device in grossly appropriate position. Electronically Signed   By: MIven FinnM.D.   On: 12/12/2020 18:16    Procedures Procedures   Medications Ordered in ED Medications  lactated ringers infusion (has no administration in time range)  levofloxacin (LEVAQUIN) IVPB 750 mg (has no administration in time range)  metroNIDAZOLE (FLAGYL) IVPB 500 mg (has no administration in time range)  lactated ringers bolus 1,000 mL (has no administration in time range)    And  lactated ringers bolus 1,000 mL (has no administration in time range)    And  lactated ringers bolus 1,000 mL (has no administration in time range)    ED Course  I have reviewed the triage vital signs and the nursing notes.  Pertinent labs & imaging results that were available during my care of the patient were reviewed by me and considered in my medical decision making (see chart for details).    MDM Rules/Calculators/A&P                           Patient here with diffuse  abdominal pain and reported fever at home.  Endorses a chronic abdominal pain but pain worsening recently. fever vomiting and nausea are new.  On exam, she has diffuse tenderness of the abdomen without guarding or rebound.  No CVA tenderness.  Low-grade fever.  Patient appears uncomfortable.  Labs interpreted by me, show leukocytosis with white count of 18,000, left shift, likely evolving sepsis.  Sepsis protocol initiated with fluids and antibiotics.  Lactic acid elevated at 3.2.  AST elevated at 65 ALT unremarkable alk phos 237.  Total bilirubin slightly elevated at 1.3.  Lipase unremarkable.  Urinalysis shows nitrite positive urine with large amount of leukocytes and greater than 50 white cells with many bacteria.  Urine culture is pending.  CT abdomen shows hepatomegaly with several hyperintense lesions and nonemergent MRI of liver recommended for further evaluation. Given patient's developing urosepsis, will plan to admit.  Discussed findings with [facility] hospitalist, Dr. Josephine Cables who agrees to admit.   Final Clinical Impression(s) / ED Diagnoses Final diagnoses:  Sepsis due to urinary tract infection [facility])    Rx / DC Orders ED Discharge Orders     None        Bufford Lope 12/12/20 2004    Milton Ferguson, MD 12/19/20 1654

## 2020-12-12 NOTE — ED Notes (Signed)
Unable to obtain blood culture. Antibiotics started.

## 2020-12-12 NOTE — ED Notes (Signed)
Date and time results received: 12/12/20 4:45 PM  (use smartphrase ".now" to insert current time)  Test: Lactic Acid Critical Value: 3.2  Name of Provider Notified: Dr. Roderic Palau  Orders Received? Or Actions Taken?: see chart

## 2020-12-12 NOTE — H&P (Addendum)
History and Physical  Sharon Lawrence GYI:[id number] DOB: [birth year] DOA: 12/12/2020  Referring physician: Kem Parkinson, PA-C PCP: Pcp, No  Patient coming from: Home  Chief Complaint: Fever  HPI: Sharon Lawrence is a 55 y.o. female with medical history significant for  coronary artery disease status post MI in 2019, iron deficiency anemia, celiac disease, anxiety, ADHD and chronic pain syndrome who presents to the ED due to 3 day onset of fever. She complained of  3 month history abdominal pain, nausea and vomiting.  Abdominal pain was constant, sharp and crampy in nature.  Pain was not relieved with opioids and eating and drinking worsens vomiting, patient states she has been compliant with gluten-free diet.  She denies chest pain, shortness of breath, headache, constipation or diarrhea Patient was admitted from 5/10-5/13 due to intractable nausea and vomiting due to celiac disease, during which she was started on gluten-free diet and metoclopramide for a component of gastroparesis  ED Course: In the emergency department, patient was tachycardic, BP was 123/86 and other vital signs were within normal range.  Work-up in the ED showed leukocytosis, thrombocytosis, hyponatremia, lactic acidosis, hypoalbuminemia, transaminitis.  Urinalysis was positive for large leukocytes and nitrite as well as many bacteria.  Lipase level was normal.  Influenza A, B, SARS coronavirus was negative. CT abdomen pelvis with contrast showed hepatomegaly and hepatic steatosis with several hypodense lesions Patient was treated with Levaquin, IV hydration was provided.  Ibuprofen was given.  Hospitalist was asked to admit patient for further evaluation and management.  Review of Systems: Constitutional: Positive for fever and appetite change.  Negative for chills HENT: Negative for ear pain and sore throat.   Eyes: Negative for pain and visual disturbance.  Respiratory: Negative for cough, chest tightness and shortness of  breath.   Cardiovascular: Negative for chest pain and palpitations.  Gastrointestinal: Positive for abdominal pain, nausea and vomiting.  Endocrine: Negative for polyphagia and polyuria.  Genitourinary: Negative for decreased urine volume, dysuria, enuresis Musculoskeletal: Negative for arthralgias and back pain.  Skin: Negative for color change and rash.  Allergic/Immunologic: Negative for immunocompromised state.  Neurological: Positive for weakness.  Negative for tremors, syncope, speech difficulty Hematological: Does not bruise/bleed easily.  All other systems reviewed and are negative   Past Medical History:  Diagnosis Date   ADHD (attention deficit hyperactivity disorder)    Anxiety    B12 deficiency 02/07/2011   Back pain    CAD (coronary artery disease)    a. s/p recent STEMI on 09/15/2017 with DES to RCA   Celiac disease    Folic acid deficiency 35/0/0938   Headache    Iron deficiency anemia 02/07/2011   Myocardial infarction ([place]) 09/15/2017   Past Surgical History:  Procedure Laterality Date   BIOPSY  07/12/2020   Procedure: BIOPSY;  Surgeon: Rogene Houston, MD;  Location: [facility];  Service: Endoscopy;;  duodenum(2nd part);antral   CHOLECYSTECTOMY     COLONOSCOPY WITH PROPOFOL N/A 09/26/2017   rehman examined portion of ileum normal, - Five small, non-bleeding polyps in the rectum, in the sigmoid colon and at the hepatic   CORONARY STENT INTERVENTION Right 09/15/2017   Procedure: CORONARY STENT INTERVENTION;  Surgeon: Jettie Booze, MD;  Location: [place];  Service: Cardiovascular;  Laterality: Right;  RCA   ESOPHAGOGASTRODUODENOSCOPY (EGD) WITH PROPOFOL N/A 07/12/2020   rehman: normal hypopharynx, Normal esophagus. z line regular 35 cm from incisors, congested and erythematous mucosa in antrum, mucosal changes in duodenum, very  pronounced changes to suggest active celiac disease   KNEE CARTILAGE SURGERY Right    LEFT HEART CATH AND CORONARY  ANGIOGRAPHY N/A 09/15/2017   Procedure: LEFT HEART CATH AND CORONARY ANGIOGRAPHY;  Surgeon: Jettie Booze, MD;  Location: [place] [facility];  Service: Cardiovascular;  Laterality: N/A;   TONSILLECTOMY      Social History:  reports that she quit smoking about 3 years ago. Her smoking use included cigarettes. She has a 3.00 pack-year smoking history. She has never used smokeless tobacco. She reports that she does not drink alcohol and does not use drugs.   Allergies  Allergen Reactions   Bee Venom Anaphylaxis   Erythromycin Anaphylaxis   Cephalexin    Neomycin-Bacitracin-Polymyxin  [Bacitracin-Neomycin-Polymyxin] Swelling    Sight of application   Neosporin Original [Bacitracin-Neomycin-Polymyxin] Swelling    Swelling at site of application   Prednisone    Sulfa Antibiotics Swelling    Sulfa eye drops caused eyes to swell Sulfa eye drops caused eyes to swell   Tobrex [Tobramycin] Swelling   Tramadol Hives   Penicillins Rash    Has patient had a PCN reaction causing immediate rash, facial/tongue/throat swelling, SOB or lightheadedness with hypotension: Yes Has patient had a PCN reaction causing severe rash involving mucus membranes or skin necrosis: No Has patient had a PCN reaction that required hospitalization: No Has patient had a PCN reaction occurring within the last 10 years: No If all of the above answers are "NO", then may proceed with Cephalosporin use.     Family History  Problem Relation Age of Onset   Cancer Mother    Hypertension Mother    Hypertension Father    Hypothyroidism Father    Cancer Sister    Anxiety disorder Sister    Depression Sister    CAD Maternal Grandfather    Cirrhosis Paternal Grandfather    CAD Paternal Grandmother    Diabetes Paternal Grandmother    Hypothyroidism Paternal Grandmother    Drug abuse Son    Bipolar disorder Son      Prior to Admission medications   Medication Sig Start Date End Date Taking? Authorizing Provider   aspirin 81 MG chewable tablet Chew 1 tablet (81 mg total) by mouth daily. 09/18/17  Yes Reino Bellis B, NP  baclofen (LIORESAL) 20 MG tablet Take by mouth at bedtime. 03/30/20  Yes [provider]  famotidine (PEPCID) 40 MG tablet Take 1 tablet (40 mg total) by mouth at bedtime. 10/26/20  Yes Rehman, Mechele Dawley, MD  ibuprofen (ADVIL) 800 MG tablet Take 800 mg by mouth 3 (three) times daily.   Yes [provider]  levonorgestrel (MIRENA) 20 MCG/24HR IUD by Intrauterine route.   Yes [provider]  loperamide (IMODIUM) 2 MG capsule Take 1 capsule (2 mg total) by mouth 3 (three) times daily as needed for diarrhea or loose stools. 07/14/20  Yes Kathie Dike, MD  metoCLOPramide (REGLAN) 10 MG tablet Take 1 tablet (10 mg total) by mouth 3 (three) times daily before meals. 10/26/20  Yes Rehman, Mechele Dawley, MD  nitroGLYCERIN (NITROLINGUAL) 0.4 MG/SPRAY spray Place 2 sprays under the tongue every 5 (five) minutes x 3 doses as needed. 04/03/20  Yes [provider]  nystatin (MYCOSTATIN/NYSTOP) powder Apply topically.   Yes [provider]  omeprazole (PRILOSEC) 40 MG capsule Take 1 capsule (40 mg total) by mouth daily. 11/28/20  Yes Carlan, Chelsea L, NP  ondansetron (ZOFRAN-ODT) 8 MG disintegrating tablet Take 1 tablet (8 mg total)  by mouth 3 (three) times daily as needed for nausea. 07/14/20  Yes Kathie Dike, MD  oxyCODONE (OXY IR/ROXICODONE) 5 MG immediate release tablet Take 1 tablet (5 mg total) by mouth every 8 (eight) hours as needed for severe pain. Must last 30 days. 11/17/20 12/17/20 Yes Milinda Pointer, MD  pregabalin (LYRICA) 150 MG capsule Take 150 mg by mouth 2 (two) times daily.   Yes [provider]  prochlorperazine (COMPAZINE) 10 MG tablet TAKE (1) TABLET BY MOUTH EVERY SIX HOURS AS NEEDED NAUSEA OR VOMITING. Patient taking differently: Take 10 mg by mouth every 6 (six) hours as needed for nausea or vomiting. 10/10/20  Yes Rehman, Mechele Dawley, MD  rizatriptan (MAXALT) 10 MG tablet Take 10 mg by mouth as needed. 12/31/19  Yes [provider]  torsemide (DEMADEX) 20 MG tablet 20 mg once. As needed. 10/17/17  Yes [provider]  Vitamin D, Ergocalciferol, (DRISDOL) 1.25 MG (50000 UT) CAPS capsule Take 50,000 Units by mouth every 7 (seven) days. Wednesday 09/14/18  Yes [provider]  clotrimazole (MYCELEX) 10 MG troche Take 10 mg by mouth 5 (five) times daily. Patient not taking: No sig reported 09/11/20   [provider]  Cyanocobalamin (VITAMIN B-12 IJ) Inject as directed every 30 (thirty) days.  Patient not taking: Reported on 11/28/2020    [provider]  diazepam (VALIUM) 10 MG tablet Take 1 tablet (10 mg total) by mouth 60 (sixty) minutes before procedure for 1 dose. Take with a sip of water, on an empty stomach. Do not eat anything for 6 hours prior to procedure. Patient not taking: No sig reported 09/14/20   Milinda Pointer, MD  fluconazole (DIFLUCAN) 150 MG tablet Take by mouth. Patient not taking: No sig reported 09/11/20   [provider]  fluticasone (FLONASE) 50 MCG/ACT nasal spray Place 1 spray into both nostrils daily. As needed. Patient not taking: Reported on 12/12/2020    [provider]  oxyCODONE (OXY IR/ROXICODONE) 5 MG immediate release tablet Take 1 tablet (5 mg total) by mouth every 8 (eight) hours as needed for severe pain. Must last 30 days. 12/17/20 01/16/21  Milinda Pointer, MD    Physical Exam: BP 96/72   Pulse (!) 106   Temp 99 F (37.2 C) (Oral)   Resp 20   Wt 82.6 kg   SpO2 96%   BMI 32.24 kg/m   General: 55 y.o. year-old female well developed well nourished in no acute distress.  Alert and oriented x3. HEENT: NCAT, EOMI Neck: Supple, trachea medial Cardiovascular: Regular rate and rhythm with no rubs or gallops.  No thyromegaly or JVD noted.  No lower extremity edema. 2/4 pulses in all 4 extremities. Respiratory: Clear to  auscultation with no wheezes or rales. Good inspiratory effort. Abdomen: Soft, tender to palpation of abdomen without guarding.  Normal bowel sounds x4 quadrants. Muskuloskeletal: Positive for bilateral lower extremity edema.  No cyanosis or clubbing  Neuro: CN II-XII intact, strength 5/5 x 4, sensation, reflexes intact Skin: No ulcerative lesions noted or rashes Psychiatry: Judgement and insight appear normal. Mood is appropriate for condition and setting          Labs on Admission:  Basic Metabolic Panel: Recent Labs  Lab 12/12/20 1532  NA 133*  K 3.9  CL 99  CO2 21*  GLUCOSE 75  BUN 8  CREATININE 0.69  CALCIUM 7.8*   Liver Function Tests: Recent Labs  Lab 12/12/20 1532  AST 65*  ALT 34  ALKPHOS 237*  BILITOT 1.3*  PROT 6.2*  ALBUMIN 2.6*   Recent Labs  Lab 12/12/20 1532  LIPASE 16   No results for input(s): AMMONIA in the last 168 hours. CBC: Recent Labs  Lab 12/12/20 1538  WBC 18.2*  NEUTROABS 12.7*  HGB 11.2*  HCT 35.6*  MCV 93.7  PLT 561*   Cardiac Enzymes: No results for input(s): CKTOTAL, CKMB, CKMBINDEX, TROPONINI in the last 168 hours.  BNP (last 3 results) No results for input(s): BNP in the last 8760 hours.  ProBNP (last 3 results) No results for input(s): PROBNP in the last 8760 hours.  CBG: No results for input(s): GLUCAP in the last 168 hours.  Radiological Exams on Admission: CT ABDOMEN PELVIS W CONTRAST  Result Date: 12/12/2020 CLINICAL DATA:  Abdominal pain, acute, nonlocalized. Pt c/o abdominal swelling x 3 months EXAM: CT ABDOMEN AND PELVIS WITH CONTRAST TECHNIQUE: Multidetector CT imaging of the abdomen and pelvis was performed using the standard protocol following bolus administration of intravenous contrast. CONTRAST:  164m OMNIPAQUE IOHEXOL 300 MG/ML  SOLN COMPARISON:  CT abdomen pelvis 06/25/2020, CT abdomen pelvis 06/10/2012 FINDINGS: Lower chest: Left lower lobe linear atelectasis. Hepatobiliary: The liver is enlarged  measuring up to 23cm. vague 0.9 cm right posterior hepatic lobe hyperdensity. Similar findings within the lateral abdomen urine left hepatic lobe (2:28) measuring up to 2.5 cm. The hepatic parenchyma is diffusely hypodense compared to the splenic parenchyma consistent with fatty infiltration. No focal liver abnormality. Status post cholecystectomy. No biliary dilatation. Pancreas: No focal lesion. Normal pancreatic contour. No surrounding inflammatory changes. No main pancreatic ductal dilatation. Spleen: Normal in size without focal abnormality. Adrenals/Urinary Tract: Bilateral and renal nodularity appears similar to prior measuring up to 2 cm bilaterally with associated density of 40-50 Hounsfield units. Stomach/Bowel: Stomach is within normal limits. No evidence of bowel wall thickening or dilatation. Appendix appears normal. Vascular/Lymphatic: No abdominal aorta or iliac aneurysm. Mild atherosclerotic plaque of the aorta and its branches. No abdominal, pelvic, or inguinal lymphadenopathy. Reproductive: T-shaped intrauterine device noted within the uterus in grossly appropriate position within the endometrial canal. Uterus and bilateral adnexa are unremarkable. Other: Trace volume simple free fluid within the abdomen or pelvis. No intraperitoneal free gas. No organized fluid collection. Musculoskeletal: Subcutaneus soft tissue edema. No suspicious lytic or blastic osseous lesions. No acute displaced fracture. Multilevel degenerative changes of the spine. IMPRESSION: 1. Hepatomegaly and hepatic steatosis with several hyperdense lesions. Recommend nonemergent MRI liver protocol further evaluation. When the patient is clinically stable and able to follow directions and hold their breath (preferably as an outpatient) further evaluation with dedicated abdominal MRI should be considered. 2. Interval development of trace simple free fluid ascites. 3. T-shaped intrauterine device in grossly appropriate position.  Electronically Signed   By: MIven FinnM.D.   On: 12/12/2020 18:16    EKG: I independently viewed the EKG done and my findings are as followed: Sinus tachycardia at a rate of 106 bpm  Assessment/Plan Present on Admission:  Sepsis secondary to UTI ([facility]  Fatty liver disease, nonalcoholic  Transaminitis  Thrombocytosis  Principal Problem:   Sepsis secondary to UTI ([facility] Active Problems:   Fatty liver disease, nonalcoholic   Thrombocytosis   Nausea & vomiting   Transaminitis   Leukocytosis   Abdominal pain   Lactic acidosis   Hypoalbuminemia due to protein-calorie malnutrition (HCC)   Hyponatremia  Severe sepsis secondary to presumed UTI vs bacteremia POA Patient was tachycardic, WBC 18.2.  She complained of 3-day  onset of fever (though afebrile in the ED).  Urinalysis was positive for UTI, though patient denies any irritative bladder symptoms.  Lactic acid was elevated and patient requires a lot of IV fluids to maintain a soft BP. She was started on IV metronidazole and levofloxacin, she will continue with same at this time with plan to de-escalate/discontinue based on culture and urine culture.  Procalcitonin will be checked  Lactic acidosis possibly secondary to above Lactic acid 3.2 > 4.5 > 3.7, continue IV hydration and continue to trend lactic acid  Transaminitis in the setting of Hepatomegaly and Hepatic steatosis This may be due to shock liver AST 65, ALP 237 Continue to monitor liver enzymes  Abdominal pain, Nausea and vomiting History of celiac disease Continue IV IV hydration Continue IV Zosyn 3.375 q.12h Continue  Oxycodone per home regimen Continue IV metoclopramide p.r.n. Continue gluten-free diet Gastroenterology will be consulted and we shall await further recommendation  Leukocytosis possibly secondary to sepsis WBC 18.2, continue treatment as described for severe sepsis  Hyponatremia Na 133, IV hydration was provided Continue to monitor sodium  levels  Hypoalbuminemia secondary to moderate protein calorie malnutrition Albumin 2.6, protein supplement will be provided  Thrombocytosis possibly reactive Platelets 563, continue to monitor platelet levels  GERD Continue famotidine, Protonix  Obesity class III (BMI 40.47) Patient will be counseled on diet and lifestyle modification when more stable   DVT prophylaxis: Lovenox  Code Status: Full code  Family Communication: Father at bedside (all questions answered to satisfaction)  Disposition Plan:  Patient is from:                        home Anticipated DC to:                   SNF or family members home Anticipated DC date:               2-3 days Anticipated DC barriers:          Patient requires inpatient management due to sepsis and abdominal pain requiring IV antibiotics and pending gastroenterology consult  Consults called: Gastroenterology  Admission status: Inpatient   Bernadette Hoit MD [facility]  12/12/2020, 10:21 PM

## 2020-12-12 NOTE — ED Triage Notes (Signed)
Fever, nausea and vomiting for 3 days. Abdominal problems for 3 months, also c/o vaginal yeast infection

## 2020-12-13 LAB — COMPREHENSIVE METABOLIC PANEL
ALT: 24 U/L (ref 0–44)
AST: 43 U/L — ABNORMAL HIGH (ref 15–41)
Albumin: 1.9 g/dL — ABNORMAL LOW (ref 3.5–5.0)
Alkaline Phosphatase: 176 U/L — ABNORMAL HIGH (ref 38–126)
Anion gap: 6 (ref 5–15)
BUN: 7 mg/dL (ref 6–20)
CO2: 22 mmol/L (ref 22–32)
Calcium: 6.8 mg/dL — ABNORMAL LOW (ref 8.9–10.3)
Chloride: 107 mmol/L (ref 98–111)
Creatinine, Ser: 0.6 mg/dL (ref 0.44–1.00)
GFR, Estimated: >60 mL/min (ref >60)
Glucose, Bld: 85 mg/dL (ref 70–99)
Potassium: 3.1 mmol/L — ABNORMAL LOW (ref 3.5–5.1)
Sodium: 135 mmol/L (ref 135–145)
Total Bilirubin: 1 mg/dL (ref 0.3–1.2)
Total Protein: 4.5 g/dL — ABNORMAL LOW (ref 6.5–8.1)

## 2020-12-13 LAB — CBC
HCT: 25 % — ABNORMAL LOW (ref 36.0–46.0)
Hemoglobin: 8.4 g/dL — ABNORMAL LOW (ref 12.0–15.0)
MCH: 29.9 pg (ref 26.0–34.0)
MCHC: 33.6 g/dL (ref 30.0–36.0)
MCV: 89 fL (ref 80.0–100.0)
Platelets: 455 10*3/uL — ABNORMAL HIGH (ref 150–400)
RBC: 2.81 MIL/uL — ABNORMAL LOW (ref 3.87–5.11)
RDW: 23.1 % — ABNORMAL HIGH (ref 11.5–15.5)
WBC: 11.2 10*3/uL — ABNORMAL HIGH (ref 4.0–10.5)
nRBC: 0 % (ref 0.0–0.2)

## 2020-12-13 LAB — IRON AND TIBC
Iron: 89 ug/dL (ref 28–170)
Saturation Ratios: 75 % — ABNORMAL HIGH (ref 10.4–31.8)
TIBC: 118 ug/dL — ABNORMAL LOW (ref 250–450)
UIBC: 29 ug/dL

## 2020-12-13 LAB — PHOSPHORUS: Phosphorus: 2.7 mg/dL (ref 2.5–4.6)

## 2020-12-13 LAB — PROCALCITONIN: Procalcitonin: 0.64 ng/mL

## 2020-12-13 LAB — PROTIME-INR
INR: 1.2 (ref 0.8–1.2)
Prothrombin Time: 15 seconds (ref 11.4–15.2)

## 2020-12-13 LAB — HEMOGLOBIN AND HEMATOCRIT, BLOOD
HCT: 26.8 % — ABNORMAL LOW (ref 36.0–46.0)
Hemoglobin: 8.9 g/dL — ABNORMAL LOW (ref 12.0–15.0)

## 2020-12-13 LAB — MAGNESIUM: Magnesium: 1.7 mg/dL (ref 1.7–2.4)

## 2020-12-13 LAB — MRSA NEXT GEN BY PCR, NASAL: MRSA by PCR Next Gen: NOT DETECTED

## 2020-12-13 LAB — FERRITIN: Ferritin: 269 ng/mL (ref 11–307)

## 2020-12-13 LAB — LACTIC ACID, PLASMA
Lactic Acid, Venous: 2.3 mmol/L (ref 0.5–1.9)
Lactic Acid, Venous: 1.1 mmol/L (ref 0.5–1.9)

## 2020-12-13 LAB — APTT: aPTT: 40 seconds — ABNORMAL HIGH (ref 24–36)

## 2020-12-13 MED ORDER — FUROSEMIDE 10 MG/ML IJ SOLN
40.0000 mg | Freq: Two times a day (BID) | INTRAMUSCULAR | Status: DC
Start: 2020-12-13 — End: 2020-12-13
  Administered 2020-12-13: 40 mg via INTRAVENOUS
  Filled 2020-12-13: qty 4

## 2020-12-13 MED ORDER — LACTATED RINGERS IV SOLN: INTRAVENOUS | Status: DC

## 2020-12-13 MED ORDER — FUROSEMIDE 10 MG/ML IJ SOLN
40.0000 mg | Freq: Two times a day (BID) | INTRAMUSCULAR | Status: AC
Start: 2020-12-14 — End: 2020-12-14
  Administered 2020-12-14: 40 mg via INTRAVENOUS
  Filled 2020-12-13: qty 4

## 2020-12-13 MED ORDER — MAGNESIUM SULFATE 2 GM/50ML IV SOLN
2.0000 g | Freq: Once | INTRAVENOUS | Status: AC
  Administered 2020-12-13: 2 g via INTRAVENOUS
  Filled 2020-12-13: qty 50

## 2020-12-13 MED ORDER — BOOST / RESOURCE BREEZE PO LIQD CUSTOM: 1.0000 | Freq: Three times a day (TID) | ORAL | Status: DC

## 2020-12-13 MED ORDER — ADULT MULTIVITAMIN W/MINERALS CH
1.0000 | ORAL_TABLET | Freq: Every day | ORAL | Status: DC
  Administered 2020-12-13 – 2020-12-16 (×4): 1 via ORAL
  Filled 2020-12-13 (×4): qty 1

## 2020-12-13 MED ORDER — KETOCONAZOLE 2 % EX CREA
TOPICAL_CREAM | Freq: Two times a day (BID) | CUTANEOUS | Status: DC
  Administered 2020-12-13 – 2020-12-14 (×3): 1 via TOPICAL
  Filled 2020-12-13 (×2): qty 15

## 2020-12-13 MED ORDER — CHLORHEXIDINE GLUCONATE CLOTH 2 % EX PADS
6.0000 | MEDICATED_PAD | Freq: Every day | CUTANEOUS | Status: DC
  Administered 2020-12-13 – 2020-12-16 (×5): 6 via TOPICAL

## 2020-12-13 MED ORDER — METOCLOPRAMIDE HCL 5 MG/ML IJ SOLN: 10.0000 mg | Freq: Four times a day (QID) | INTRAMUSCULAR | Status: DC | PRN

## 2020-12-13 MED ORDER — PANTOPRAZOLE SODIUM 40 MG PO TBEC
40.0000 mg | DELAYED_RELEASE_TABLET | Freq: Every day | ORAL | Status: DC
  Administered 2020-12-13 – 2020-12-16 (×4): 40 mg via ORAL
  Filled 2020-12-13 (×4): qty 1

## 2020-12-13 MED ORDER — POTASSIUM CHLORIDE CRYS ER 20 MEQ PO TBCR
40.0000 meq | EXTENDED_RELEASE_TABLET | Freq: Once | ORAL | Status: AC
  Administered 2020-12-13: 40 meq via ORAL
  Filled 2020-12-13: qty 2

## 2020-12-13 MED ORDER — OXYCODONE HCL 5 MG PO TABS
5.0000 mg | ORAL_TABLET | Freq: Three times a day (TID) | ORAL | Status: DC | PRN
  Administered 2020-12-13 – 2020-12-16 (×11): 5 mg via ORAL
  Filled 2020-12-13 (×11): qty 1

## 2020-12-13 MED ORDER — FLUCONAZOLE 150 MG PO TABS
150.0000 mg | ORAL_TABLET | Freq: Every day | ORAL | Status: AC
  Administered 2020-12-13 – 2020-12-15 (×3): 150 mg via ORAL
  Filled 2020-12-13 (×3): qty 1

## 2020-12-13 MED ORDER — FAMOTIDINE 20 MG PO TABS
20.0000 mg | ORAL_TABLET | Freq: Every day | ORAL | Status: DC
  Administered 2020-12-13 – 2020-12-15 (×3): 20 mg via ORAL
  Filled 2020-12-13 (×4): qty 1

## 2020-12-13 MED ORDER — POTASSIUM CHLORIDE CRYS ER 20 MEQ PO TBCR
40.0000 meq | EXTENDED_RELEASE_TABLET | Freq: Three times a day (TID) | ORAL | Status: AC
  Administered 2020-12-13 (×3): 40 meq via ORAL
  Filled 2020-12-13 (×3): qty 2

## 2020-12-13 MED ORDER — ENSURE ENLIVE PO LIQD: 237.0000 mL | Freq: Two times a day (BID) | ORAL | Status: DC

## 2020-12-13 MED ORDER — METRONIDAZOLE 500 MG/100ML IV SOLN
500.0000 mg | Freq: Two times a day (BID) | INTRAVENOUS | Status: DC
Start: 2020-12-13 — End: 2020-12-14
  Administered 2020-12-13 – 2020-12-14 (×3): 500 mg via INTRAVENOUS
  Filled 2020-12-13 (×3): qty 100

## 2020-12-13 MED ORDER — ALBUMIN HUMAN 25 % IV SOLN
50.0000 g | Freq: Every day | INTRAVENOUS | Status: DC
  Administered 2020-12-13 – 2020-12-15 (×3): 50 g via INTRAVENOUS
  Filled 2020-12-13 (×4): qty 200

## 2020-12-13 NOTE — Progress Notes (Signed)
Initial Nutrition Assessment  DOCUMENTATION CODES:   Morbid obesity  INTERVENTION:   -Boost Breeze po TID, each supplement provides 250 kcal and 9 grams of protein  -MVI with minerals daily  NUTRITION DIAGNOSIS:   Predicted suboptimal nutrient intake related to nausea, vomiting, altered GI function as evidenced by per patient/family report.  GOAL:   Patient will meet greater than or equal to 90% of their needs  MONITOR:   PO intake, Supplement acceptance, Labs, Weight trends  REASON FOR ASSESSMENT:   Malnutrition Screening Tool    ASSESSMENT:   Sharon Lawrence is a 55 y.o. female with medical history significant for  coronary artery disease status post MI in 2019, iron deficiency anemia, celiac disease, anxiety, ADHD and chronic pain syndrome who presents to the ED due to 3 day onset of fever. She complained of  3 month history abdominal pain, nausea and vomiting.  Abdominal pain was constant, sharp and crampy in nature.  Pain was not relieved with opioids and eating and drinking worsens vomiting, patient states she has been compliant with gluten-free diet.  She denies chest pain, shortness of breath, headache, constipation or diarrhea  Pt admitted with severe sepsis secondary to presumed UTI vs bacteremia POA.   Reviewed I/O's: +1.6 L x 24 hours  UOP: 325 ml x 24 hours  Per H&P, pt has history of celiac disease but reports compliance with diet. Per previous RD notes, pt is knowledgeable about gluten-free diet, but reported possible indiscretion with cross-contamination. She has had a month history of nausea, vomiting, and abdominal pain. Of note, pt was hospitalized with gastroparesis last May.   No meal completion data available to assess at this time.   Reviewed wt hx; wt has been stable over the past 6 months.   Medications reviewed and includes lasix.  Obesity is a complex, chronic medical condition that is optimally managed by a multidisciplinary care team. Weight loss  is not an ideal goal for an acute inpatient hospitalization. However, if further work-up for obesity is warranted, consider outpatient referral to outpatient bariatric service and/or [place].    Labs reviewed: K: 3.1 (on PO supplementation).    Diet Order:   Diet Order             Diet gluten free Room service appropriate? Yes; Fluid consistency: Thin  Diet effective now                   EDUCATION NEEDS:   No education needs have been identified at this time  Skin:  Skin Assessment: Reviewed RN Assessment  Last BM:  12/11/20  Height:   Ht Readings from Last 1 Encounters:  12/12/20 5' (1.524 m)    Weight:   Wt Readings from Last 1 Encounters:  12/12/20 94 kg    Ideal Body Weight:  45.5 kg  BMI:  Body mass index is 40.47 kg/m.  Estimated Nutritional Needs:   Kcal:  1600-1800  Protein:  75-90 grams  Fluid:  > 1.6 L    Loistine Chance, RD, LDN, [place] Registered Dietitian II Certified Diabetes Care and Education Specialist Please refer to [facility] for RD and/or RD on-call/weekend/after hours pager

## 2020-12-13 NOTE — Progress Notes (Signed)
PROGRESS NOTE   Sharon Lawrence  JEH:[id number] DOB: April 24, 1965 DOA: 12/12/2020 PCP: Pcp, No   Chief Complaint  Patient presents with   Fever   Level of care: Stepdown  Brief Admission History:  Sharon Lawrence is a 55 year old female admitted due to Sepsis secondary to uti. Hx of:cad, htn, iron deficiency anemia, cholecystectomy, MI in 2019, and celiac disease.  She reports poor po intake for several months and recent increasing abdominal pain that caused her to come to the ed. She was noted to have leukocytosis, thrombocytosis, hyponatremia, lactic acidosis, hypoalbuminemia, transaminitis. Urinalysis was positive for large leukocytes and nitrite as well as many bacteria.  Lipase level was normal. CT abdomen pelvis with contrast showed hepatomegaly and hepatic steatosis with several hypodense lesions. When compared to previous ct abd several months ago, these are new, as well as notable increase in liver size. While gerd and celiacs have been pursued as causes of the nausea and vomiting, mass effect of the liver may be a consideration. Initial treatment of her significant uti was with fluid bolus and levofloxacin, lactic acid  improved, and urine culture sensitivities are pending.  Assessment & Plan:   Principal Problem:   Sepsis secondary to UTI ([place]) -wbc initial 18.2 10/11, today 10/12 is 11.2 -lactic acid was 4.5, now 10/12 1.1 -bicarb was 20, now 22 -urine with positive nitrites, large leukocyte, many bacteria -blood cultures pending  Active Problems:   Abdominal pain, due to hepatomegaly, with nausea and vomiting, and transaminitis -initial ast 65, 10/12 is 43   has been markedly elevated previously -alk phos was 237, now 176,  has been markedly elevated previously -GI consulted    Thrombocytosis -10/11 561, 10/12 455, adequately hydrated, elevated in the past, will continue to monitor and discuss with hematology if needed    Leukocytosis and Lactic acidosis -secondary to uti and  sepsis    Hypoalbuminemia due to protein-calorie malnutrition ([place]) -Gi consulted -Nutrition consulted -low serum protein 4.5, low serum albumin 1.9    Hyponatremia -Initial 10/11 133, now resolved 135 10/12    Anemia -10/12  hemoglobin is 8.4, will continue to trend and intervene accordingly -high saturation 75% -low tibc 187 -higher ferritin 269 -concerning for anemia of chronic disease -denies blood in bowel or urine   DVT prophylaxis: lovenox Code Status: full Family Communication: none at bedside Disposition: resolution of current uti, GI consult, expect home with outpatient followup Status is: Inpatient  Remains inpatient appropriate because:Persistent severe electrolyte disturbances, Ongoing diagnostic testing needed not appropriate for outpatient work up, IV treatments appropriate due to intensity of illness or inability to take PO, and Inpatient level of care appropriate due to severity of illness  Dispo: The patient is from: Home              Anticipated d/c is to: Home              Patient currently is not medically stable to d/c.   Difficult to place patient No       Consultants:  GI Nutrition  Procedures:  Ct abdomen  Antimicrobials:  Levofloxacin 10/11-current Metronidazole 10/11-current Fluconazole 10/12   Subjective: Looks visibly tired in bed, reports poor po intake, conversational, reports upper abd tenderness, reports 'fullness' sensation prevents her from eating.  Objective: Vitals:   12/13/20 0600 12/13/20 0750 12/13/20 0800 12/13/20 1124  BP: (!) 100/55  112/72   Pulse: 98 99 (!) 108 (!) 104  Resp: 20 18 20  (!) 29  Temp:  98.5 F (36.9 C) 98.5 F (36.9 C) 98 F (36.7 C)  TempSrc:  Oral Oral Oral  SpO2: 96% 97% 100% 99%  Weight:      Height:        Intake/Output Summary (Last 24 hours) at 12/13/2020 1339 Last data filed at 12/13/2020 1215 Gross per 24 hour  Intake 1973.77 ml  Output 1425 ml  Net 548.77 ml   Filed Weights    12/12/20 1737 12/12/20 2314  Weight: 82.6 kg 94 kg    Examination:  General exam: Appears calm and tired, pale, no acute distress  Respiratory system: Clear to auscultation. Respiratory effort normal.  Cardiovascular system: normal S1 & S2 heard. No JVD, murmurs, rubs, gallops or clicks. 3+ lower extremity edema and low serum protein  Gastrointestinal system: Abdomen is bloated per her, tender in upper quadrants, bowel sounds present,   Central nervous system: Alert and oriented. No focal neurological deficits.  Extremities: moves all extremities appropriately  Skin: new bruises since Friday, no notable injuries  Psychiatry: Judgement and insight appear normal. Mood & affect appropriate.   Data Reviewed: I have personally reviewed following labs and imaging studies  CBC: Recent Labs  Lab 12/12/20 1538 12/13/20 0435 12/13/20 0708  WBC 18.2* 11.2*  --   NEUTROABS 12.7*  --   --   HGB 11.2* 8.4* 8.9*  HCT 35.6* 25.0* 26.8*  MCV 93.7 89.0  --   PLT 561* 455*  --     Basic Metabolic Panel: Recent Labs  Lab 12/12/20 1532 12/13/20 0435  NA 133* 135  K 3.9 3.1*  CL 99 107  CO2 21* 22  GLUCOSE 75 85  BUN 8 7  CREATININE 0.69 0.60  CALCIUM 7.8* 6.8*  MG  --  1.7  PHOS  --  2.7    GFR: Estimated Creatinine Clearance: 83.3 mL/min (by C-G formula based on SCr of 0.6 mg/dL).  Liver Function Tests: Recent Labs  Lab 12/12/20 1532 12/13/20 0435  AST 65* 43*  ALT 34 24  ALKPHOS 237* 176*  BILITOT 1.3* 1.0  PROT 6.2* 4.5*  ALBUMIN 2.6* 1.9*    CBG: No results for input(s): GLUCAP in the last 168 hours.  Recent Results (from the past 240 hour(s))  Resp Panel by RT-PCR (Flu A&B, Covid)     Status: None   Collection Time: 12/12/20  8:05 PM  Result Value Ref Range Status   SARS Coronavirus 2 by RT PCR NEGATIVE NEGATIVE Final    Comment: (NOTE) SARS-CoV-2 target nucleic acids are NOT DETECTED.  The SARS-CoV-2 RNA is generally detectable in upper  respiratory specimens during the acute phase of infection. The lowest concentration of SARS-CoV-2 viral copies this assay can detect is 138 copies/mL. A negative result does not preclude SARS-Cov-2 infection and should not be used as the sole basis for treatment or other patient management decisions. A negative result may occur with  improper specimen collection/handling, submission of specimen other than nasopharyngeal swab, presence of viral mutation(s) within the areas targeted by this assay, and inadequate number of viral copies(<138 copies/mL). A negative result must be combined with clinical observations, patient history, and epidemiological information. The expected result is Negative.  Fact Sheet for Patients:  EntrepreneurPulse.com.au  Fact Sheet for Healthcare Providers:  IncredibleEmployment.be  This test is no t yet approved or cleared by the [place] FDA and  has been authorized for detection and/or diagnosis of SARS-CoV-2 by FDA under an Emergency Use Authorization (EUA). This EUA will remain  in effect (meaning this test can be used) for the duration of the COVID-19 declaration under Section 564(b)(1) of the Act, 21 U.S.C.section 360bbb-3(b)(1), unless the authorization is terminated  or revoked sooner.       Influenza A by PCR NEGATIVE NEGATIVE Final   Influenza B by PCR NEGATIVE NEGATIVE Final    Comment: (NOTE) The Xpert Xpress SARS-CoV-2/FLU/RSV plus assay is intended as an aid in the diagnosis of influenza from Nasopharyngeal swab specimens and should not be used as a sole basis for treatment. Nasal washings and aspirates are unacceptable for Xpert Xpress SARS-CoV-2/FLU/RSV testing.  Fact Sheet for Patients: EntrepreneurPulse.com.au  Fact Sheet for Healthcare Providers: IncredibleEmployment.be  This test is not yet approved or cleared by the [place] FDA and has been  authorized for detection and/or diagnosis of SARS-CoV-2 by FDA under an Emergency Use Authorization (EUA). This EUA will remain in effect (meaning this test can be used) for the duration of the COVID-19 declaration under Section 564(b)(1) of the Act, 21 U.S.C. section 360bbb-3(b)(1), unless the authorization is terminated or revoked.  Performed at [facility], [address] [place], [place] [address]   MRSA Next Gen by PCR, Nasal     Status: None   Collection Time: 12/12/20 10:51 PM   Specimen: Nasal Mucosa; Nasal Swab  Result Value Ref Range Status   MRSA by PCR Next Gen NOT DETECTED NOT DETECTED Final    Comment: (NOTE) The GeneXpert MRSA Assay (FDA approved for NASAL specimens only), is one component of a comprehensive MRSA colonization surveillance program. It is not intended to diagnose MRSA infection nor to guide or monitor treatment for MRSA infections. Test performance is not FDA approved in patients less than 31 years old. Performed at [facility], [address], [place], [place] [address]   Culture, blood (Routine X 2) w Reflex to ID Panel     Status: None (Preliminary result)   Collection Time: 12/13/20  1:35 AM   Specimen: BLOOD  Result Value Ref Range Status   Specimen Description BLOOD BLOOD RIGHT HAND  Final   Special Requests   Final    BOTTLES DRAWN AEROBIC AND ANAEROBIC Blood Culture adequate volume   Culture   Final    NO GROWTH < 12 HOURS Performed at [facility], [address], [place], [place] [address]    Report Status PENDING  Incomplete  Culture, blood (Routine X 2) w Reflex to ID Panel     Status: None (Preliminary result)   Collection Time: 12/13/20  1:35 AM   Specimen: BLOOD  Result Value Ref Range Status   Specimen Description BLOOD BLOOD LEFT HAND  Final   Special Requests   Final    BOTTLES DRAWN AEROBIC AND ANAEROBIC Blood Culture adequate volume   Culture   Final    NO GROWTH < 12 HOURS Performed at [facility], [address],  [place], [place] [address]    Report Status PENDING  Incomplete     Radiology Studies: CT ABDOMEN PELVIS W CONTRAST  Result Date: 12/12/2020 CLINICAL DATA:  Abdominal pain, acute, nonlocalized. Pt c/o abdominal swelling x 3 months EXAM: CT ABDOMEN AND PELVIS WITH CONTRAST TECHNIQUE: Multidetector CT imaging of the abdomen and pelvis was performed using the standard protocol following bolus administration of intravenous contrast. CONTRAST:  132m OMNIPAQUE IOHEXOL 300 MG/ML  SOLN COMPARISON:  CT abdomen pelvis 06/25/2020, CT abdomen pelvis 06/10/2012 FINDINGS: Lower chest: Left lower lobe linear atelectasis. Hepatobiliary: The liver is enlarged measuring up to  23cm. vague 0.9 cm right posterior hepatic lobe hyperdensity. Similar findings within the lateral abdomen urine left hepatic lobe (2:28) measuring up to 2.5 cm. The hepatic parenchyma is diffusely hypodense compared to the splenic parenchyma consistent with fatty infiltration. No focal liver abnormality. Status post cholecystectomy. No biliary dilatation. Pancreas: No focal lesion. Normal pancreatic contour. No surrounding inflammatory changes. No main pancreatic ductal dilatation. Spleen: Normal in size without focal abnormality. Adrenals/Urinary Tract: Bilateral and renal nodularity appears similar to prior measuring up to 2 cm bilaterally with associated density of 40-50 Hounsfield units. Stomach/Bowel: Stomach is within normal limits. No evidence of bowel wall thickening or dilatation. Appendix appears normal. Vascular/Lymphatic: No abdominal aorta or iliac aneurysm. Mild atherosclerotic plaque of the aorta and its branches. No abdominal, pelvic, or inguinal lymphadenopathy. Reproductive: T-shaped intrauterine device noted within the uterus in grossly appropriate position within the endometrial canal. Uterus and bilateral adnexa are unremarkable. Other: Trace volume simple free fluid within the abdomen or pelvis. No intraperitoneal free gas. No organized  fluid collection. Musculoskeletal: Subcutaneus soft tissue edema. No suspicious lytic or blastic osseous lesions. No acute displaced fracture. Multilevel degenerative changes of the spine. IMPRESSION: 1. Hepatomegaly and hepatic steatosis with several hyperdense lesions. Recommend nonemergent MRI liver protocol further evaluation. When the patient is clinically stable and able to follow directions and hold their breath (preferably as an outpatient) further evaluation with dedicated abdominal MRI should be considered. 2. Interval development of trace simple free fluid ascites. 3. T-shaped intrauterine device in grossly appropriate position. Electronically Signed   By: Iven Finn M.D.   On: 12/12/2020 18:16    Scheduled Meds:  Chlorhexidine Gluconate Cloth  6 each Topical Daily   enoxaparin (LOVENOX) injection  40 mg Subcutaneous Q24H   famotidine  20 mg Oral QHS   feeding supplement  1 Container Oral TID BM   fluconazole  150 mg Oral Daily   furosemide  40 mg Intravenous Q12H   ketoconazole   Topical BID   multivitamin with minerals  1 tablet Oral Daily   pantoprazole  40 mg Oral Daily   potassium chloride  40 mEq Oral TID   Continuous Infusions:  sodium chloride Stopped (12/13/20 0844)   lactated ringers     And   lactated ringers     And   lactated ringers     lactated ringers Stopped (12/13/20 0844)   levofloxacin (LEVAQUIN) IV     metronidazole 500 mg (12/13/20 0507)     LOS: 1 day    Hasan Douse D Makayli Bracken, student How to contact the [facility] Attending or Consulting provider [place] or covering provider during after hours [place], for this patient?  Check the care team in [facility] and look for a) attending/consulting [facility] provider listed and b) the [facility] team listed Log into [email or web address] and use [place]'s universal password to access. If you do not have the password, please contact the hospital operator. Locate the [facility] provider you are looking for under [facility] and page to  a number that you can be directly reached. If you still have difficulty reaching the provider, please page the [facility] (Director on Call) for the Hospitalists listed on amion for assistance.  12/13/2020, 1:39 PM

## 2020-12-13 NOTE — Consult Note (Signed)
Referring Provider: No ref. provider found Primary Care Physician:  Pcp, No Primary Gastroenterologist:  Dr. Laural Golden  Date of Admission: 12/12/20 Date of Consultation: 12/13/20  Reason for Consultation:  abdominal pain  HPI:  Sharon Lawrence is a 55 y.o. year old female with history of CAD s/p MI in 2019, IDA, celiac disease, anxiety, ADHD, and chronic pain syndrome who presented to ED yesterday due to 3 day onset of fever. She reported 3 month history of abdominal pain, nausea and vomiting. Reports abdominal pain is sharp, constant and crampy in nature. Pain was not relieved with opoioids and eating/drinking worsened her symptoms. She reported compliance with gluten free diet. She denied constipation or diarrhea. Previous admission 5/10-5/13 for intractable nausea and vomiting due to celiac disease, she has been gluten free and on reglan since for gastroparesis.  ED course:tachycardic but BP and other VS WNL. UA positive for large leukocytes, nitrite as well as many bacterial. Lipase normal. Leukocytosis, thrombocytosis, hyponatremia, lactic acidosis and transaminitis present on ED work up. CT a/p with contrasted showed hepatomegaly and hepatic steatosis with several hypodense lesions. She was stared on IV Levaquin and IV fluids.   GI Consult: She states that she became very sick over the weekend with a fever. She reports that chronic abdominal pain, nausea and vomiting have been ongoing, no improvement in these on outpatient basis prior to becoming ill. She reports that abdominal bloating and abdominal pain are severe, she feels that bloating may have worsened over the past few days. She reports that occasionally the discomfort will let up some and she is able to eat. She reports 1 BM a day typically, has not had a BM in the last 2 days since being admitted. She does endorse nausea with some dry heaves today, she received zofran which she states provided some relief but not as good as compazine normally  does. States vomiting is sporadic at home and she is unable to pinpoint how many episodes she has on average.    Past Medical History:  Diagnosis Date   ADHD (attention deficit hyperactivity disorder)    Anxiety    B12 deficiency 02/07/2011   Back pain    CAD (coronary artery disease)    a. s/p recent STEMI on 09/15/2017 with DES to RCA   Celiac disease    Folic acid deficiency 94/09/960   Headache    Iron deficiency anemia 02/07/2011   Myocardial infarction ([place]) 09/15/2017    Past Surgical History:  Procedure Laterality Date   BIOPSY  07/12/2020   Procedure: BIOPSY;  Surgeon: Rogene Houston, MD;  Location: [facility];  Service: Endoscopy;;  duodenum(2nd part);antral   CHOLECYSTECTOMY     COLONOSCOPY WITH PROPOFOL N/A 09/26/2017   rehman examined portion of ileum normal, - Five small, non-bleeding polyps in the rectum, in the sigmoid colon and at the hepatic   CORONARY STENT INTERVENTION Right 09/15/2017   Procedure: CORONARY STENT INTERVENTION;  Surgeon: Jettie Booze, MD;  Location: [place] [facility];  Service: Cardiovascular;  Laterality: Right;  RCA   ESOPHAGOGASTRODUODENOSCOPY (EGD) WITH PROPOFOL N/A 07/12/2020   rehman: normal hypopharynx, Normal esophagus. z line regular 35 cm from incisors, congested and erythematous mucosa in antrum, mucosal changes in duodenum, very pronounced changes to suggest active celiac disease   KNEE CARTILAGE SURGERY Right    LEFT HEART CATH AND CORONARY ANGIOGRAPHY N/A 09/15/2017   Procedure: LEFT HEART CATH AND CORONARY ANGIOGRAPHY;  Surgeon: Jettie Booze, MD;  Location: [place]  [facility];  Service: Cardiovascular;  Laterality: N/A;   TONSILLECTOMY      Prior to Admission medications   Medication Sig Start Date End Date Taking? Authorizing Provider  aspirin 81 MG chewable tablet Chew 1 tablet (81 mg total) by mouth daily. 09/18/17  Yes Reino Bellis B, NP  baclofen (LIORESAL) 20 MG tablet Take by mouth at bedtime.  03/30/20  Yes [provider]  famotidine (PEPCID) 40 MG tablet Take 1 tablet (40 mg total) by mouth at bedtime. 10/26/20  Yes Rehman, Mechele Dawley, MD  ibuprofen (ADVIL) 800 MG tablet Take 800 mg by mouth 3 (three) times daily.   Yes [provider]  levonorgestrel (MIRENA) 20 MCG/24HR IUD by Intrauterine route.   Yes [provider]  loperamide (IMODIUM) 2 MG capsule Take 1 capsule (2 mg total) by mouth 3 (three) times daily as needed for diarrhea or loose stools. 07/14/20  Yes Kathie Dike, MD  metoCLOPramide (REGLAN) 10 MG tablet Take 1 tablet (10 mg total) by mouth 3 (three) times daily before meals. 10/26/20  Yes Rehman, Mechele Dawley, MD  nitroGLYCERIN (NITROLINGUAL) 0.4 MG/SPRAY spray Place 2 sprays under the tongue every 5 (five) minutes x 3 doses as needed. 04/03/20  Yes [provider]  nystatin (MYCOSTATIN/NYSTOP) powder Apply topically.   Yes [provider]  omeprazole (PRILOSEC) 40 MG capsule Take 1 capsule (40 mg total) by mouth daily. 11/28/20  Yes Andrius Andrepont L, NP  ondansetron (ZOFRAN-ODT) 8 MG disintegrating tablet Take 1 tablet (8 mg total) by mouth 3 (three) times daily as needed for nausea. 07/14/20  Yes Kathie Dike, MD  oxyCODONE (OXY IR/ROXICODONE) 5 MG immediate release tablet Take 1 tablet (5 mg total) by mouth every 8 (eight) hours as needed for severe pain. Must last 30 days. 11/17/20 12/17/20 Yes Milinda Pointer, MD  pregabalin (LYRICA) 150 MG capsule Take 150 mg by mouth 2 (two) times daily.   Yes [provider]  prochlorperazine (COMPAZINE) 10 MG tablet TAKE (1) TABLET BY MOUTH EVERY SIX HOURS AS NEEDED NAUSEA OR VOMITING. Patient taking differently: Take 10 mg by mouth every 6 (six) hours as needed for nausea or vomiting. 10/10/20  Yes Rehman, Mechele Dawley, MD  rizatriptan (MAXALT) 10 MG tablet Take 10 mg by mouth as needed. 12/31/19  Yes [provider]  torsemide (DEMADEX) 20 MG tablet 20 mg once. As needed.  10/17/17  Yes [provider]  Vitamin D, Ergocalciferol, (DRISDOL) 1.25 MG (50000 UT) CAPS capsule Take 50,000 Units by mouth every 7 (seven) days. Wednesday 09/14/18  Yes [provider]  clotrimazole (MYCELEX) 10 MG troche Take 10 mg by mouth 5 (five) times daily. Patient not taking: No sig reported 09/11/20   [provider]  Cyanocobalamin (VITAMIN B-12 IJ) Inject as directed every 30 (thirty) days.  Patient not taking: Reported on 11/28/2020    [provider]  diazepam (VALIUM) 10 MG tablet Take 1 tablet (10 mg total) by mouth 60 (sixty) minutes before procedure for 1 dose. Take with a sip of water, on an empty stomach. Do not eat anything for 6 hours prior to procedure. Patient not taking: No sig reported 09/14/20   Milinda Pointer, MD  fluconazole (DIFLUCAN) 150 MG tablet Take by mouth. Patient not taking: No sig reported 09/11/20   [provider]  fluticasone (FLONASE) 50 MCG/ACT nasal spray Place 1 spray into both nostrils daily. As needed. Patient not taking: Reported on 12/12/2020    [provider]  oxyCODONE (  OXY IR/ROXICODONE) 5 MG immediate release tablet Take 1 tablet (5 mg total) by mouth every 8 (eight) hours as needed for severe pain. Must last 30 days. 12/17/20 01/16/21  Milinda Pointer, MD    Current Facility-Administered Medications  Medication Dose Route Frequency Provider Last Rate Last Admin   0.9 %  sodium chloride infusion  1,000 mL Intravenous Continuous Bernadette Hoit, DO   Held at 12/13/20 0844   acetaminophen (TYLENOL) tablet 650 mg  650 mg Oral Q6H PRN Bernadette Hoit, DO       Or   acetaminophen (TYLENOL) suppository 650 mg  650 mg Rectal Q6H PRN Adefeso, Oladapo, DO       Chlorhexidine Gluconate Cloth 2 % PADS 6 each  6 each Topical Daily Adefeso, Oladapo, DO   6 each at 12/13/20 1056   enoxaparin (LOVENOX) injection 40 mg  40 mg Subcutaneous Q24H Adefeso, Oladapo, DO   40 mg at 12/12/20 2309    famotidine (PEPCID) tablet 20 mg  20 mg Oral QHS Adefeso, Oladapo, DO   20 mg at 12/13/20 0318   feeding supplement (BOOST / RESOURCE BREEZE) liquid 1 Container  1 Container Oral TID BM Johnson, Clanford L, MD       furosemide (LASIX) injection 40 mg  40 mg Intravenous Q12H Johnson, Clanford L, MD   40 mg at 12/13/20 1056   lactated ringers bolus 1,000 mL  1,000 mL Intravenous Once Adefeso, Oladapo, DO       And   lactated ringers bolus 1,000 mL  1,000 mL Intravenous Once Adefeso, Oladapo, DO       And   lactated ringers bolus 1,000 mL  1,000 mL Intravenous Once Adefeso, Oladapo, DO       lactated ringers infusion   Intravenous Continuous Johnson, Clanford L, MD   Paused at 12/13/20 0844   levofloxacin (LEVAQUIN) IVPB 750 mg  750 mg Intravenous Q24H Adefeso, Oladapo, DO       metoCLOPramide (REGLAN) injection 10 mg  10 mg Intravenous Q6H PRN Adefeso, Oladapo, DO       metroNIDAZOLE (FLAGYL) IVPB 500 mg  500 mg Intravenous Q12H Adefeso, Oladapo, DO 100 mL/hr at 12/13/20 0507 500 mg at 12/13/20 0507   multivitamin with minerals tablet 1 tablet  1 tablet Oral Daily Johnson, Clanford L, MD   1 tablet at 12/13/20 1056   ondansetron (ZOFRAN) injection 4 mg  4 mg Intravenous Q6H PRN Adefeso, Oladapo, DO   4 mg at 12/13/20 0510   oxyCODONE (Oxy IR/ROXICODONE) immediate release tablet 5 mg  5 mg Oral Q8H PRN Adefeso, Oladapo, DO   5 mg at 12/13/20 0318   pantoprazole (PROTONIX) EC tablet 40 mg  40 mg Oral Daily Adefeso, Oladapo, DO   40 mg at 12/13/20 1056   potassium chloride SA (KLOR-CON) CR tablet 40 mEq  40 mEq Oral TID Wynetta Emery, Clanford L, MD   40 mEq at 12/13/20 1056    Allergies as of 12/12/2020 - Review Complete 12/12/2020  Allergen Reaction Noted   Bee venom Anaphylaxis    Erythromycin Anaphylaxis 06/03/2012   Cephalexin  11/17/2017   Neomycin-bacitracin-polymyxin  [bacitracin-neomycin-polymyxin] Swelling 09/09/2018   Neosporin original [bacitracin-neomycin-polymyxin] Swelling 06/03/2012    Prednisone  11/17/2017   Sulfa antibiotics Swelling 06/03/2012   Tobrex [tobramycin] Swelling 09/15/2016   Tramadol Hives 06/22/2013   Penicillins Rash 06/03/2012    Family History  Problem Relation Age of Onset   Cancer Mother    Hypertension Mother    Hypertension Father  Hypothyroidism Father    Cancer Sister    Anxiety disorder Sister    Depression Sister    CAD Maternal Grandfather    Cirrhosis Paternal Grandfather    CAD Paternal Grandmother    Diabetes Paternal Grandmother    Hypothyroidism Paternal Grandmother    Drug abuse Son    Bipolar disorder Son     Social History   Socioeconomic History   Marital status: Widowed    Spouse name: Not on file   Number of children: 3   Years of education: Not on file   Highest education level: Associate degree: occupational, Hotel manager, or vocational program  Occupational History   Not on file  Tobacco Use   Smoking status: Former    Packs/day: 0.25    Years: 12.00    Pack years: 3.00    Types: Cigarettes    Quit date: 04/04/2017    Years since quitting: 3.6   Smokeless tobacco: Never  Vaping Use   Vaping Use: Never used  Substance and Sexual Activity   Alcohol use: No   Drug use: No   Sexual activity: Not Currently  Other Topics Concern   Not on file  Social History Narrative   Not on file   Social Determinants of Health   Financial Resource Strain: Not on file  Food Insecurity: Not on file  Transportation Needs: Not on file  Physical Activity: Not on file  Stress: Not on file  Social Connections: Not on file  Intimate Partner Violence: Not on file    Review of Systems: Gen: Denies, weight loss CV: Denies chest pain, heart palpitations, syncope, edema  Resp: Denies shortness of breath with rest, cough, wheezing GI: Denies dysphagia or odynophagia. Denies vomiting blood, jaundice, and fecal incontinence.  GU : Denies urinary burning, urinary frequency, urinary incontinence.  MS: Denies joint  pain,swelling, cramping Derm: Denies rash, itching, dry skin Psych: Denies depression, anxiety,confusion, or memory loss Heme: Denies bruising, bleeding, and enlarged lymph nodes.  Physical Exam: Vital signs in last 24 hours: Temp:  [98 F (36.7 C)-99.2 F (37.3 C)] 98 F (36.7 C) (10/12 1124) Pulse Rate:  [96-108] 104 (10/12 1124) Resp:  [15-29] 29 (10/12 1124) BP: (91-123)/(50-86) 112/72 (10/12 0800) SpO2:  [93 %-100 %] 99 % (10/12 1124) Weight:  [82.6 kg-94 kg] 94 kg (10/11 2314) Last BM Date: 12/11/20 General:   Alert,  Well-developed, well-nourished, pleasant and cooperative in NAD Head:  Normocephalic and atraumatic. Eyes:  Sclera clear, no icterus.   Conjunctiva pink. Ears:  Normal auditory acuity. Nose:  No deformity, discharge,  or lesions. Mouth:  No deformity or lesions, dentition normal. Lungs:  Clear throughout to auscultation.   No wheezes, crackles, or rhonchi. No acute distress. Heart:  Regular rate and rhythm; no murmurs, clicks, rubs,  or gallops. Abdomen:  Soft, mildly distended but non taut, tender to palpation,  No masses, hepatosplenomegaly or hernias noted. Normal bowel sounds, without guarding, and without rebound.  Rectal:  Deferred until time of colonoscopy.   Msk:  Symmetrical without gross deformities. Normal posture. Extremities:  Without clubbing, LE Edema Neurologic:  Alert and  oriented x4;  grossly normal neurologically. Skin:  Intact without significant lesions or rashes. Psych:  Alert and cooperative. Normal mood and affect.  Intake/Output from previous day: 10/11 0701 - 10/12 0700 In: 1973.8 [P.O.:500; I.V.:382.8; IV Piggyback:1091] Out: 325 [Urine:325] Intake/Output this shift: Total I/O In: -  Out: 750 [Urine:750]  Lab Results: Recent Labs    12/12/20 1538 12/13/20  4270 12/13/20 0708  WBC 18.2* 11.2*  --   HGB 11.2* 8.4* 8.9*  HCT 35.6* 25.0* 26.8*  PLT 561* 455*  --    BMET Recent Labs    12/12/20 1532 12/13/20 0435  NA  133* 135  K 3.9 3.1*  CL 99 107  CO2 21* 22  GLUCOSE 75 85  BUN 8 7  CREATININE 0.69 0.60  CALCIUM 7.8* 6.8*   LFT Recent Labs    12/12/20 1532 12/13/20 0435  PROT 6.2* 4.5*  ALBUMIN 2.6* 1.9*  AST 65* 43*  ALT 34 24  ALKPHOS 237* 176*  BILITOT 1.3* 1.0   PT/INR Recent Labs    12/13/20 0435  LABPROT 15.0  INR 1.2   Hepatitis Panel No results for input(s): HEPBSAG, HCVAB, HEPAIGM, HEPBIGM in the last 72 hours. C-Diff No results for input(s): CDIFFTOX in the last 72 hours.  Studies/Results: CT ABDOMEN PELVIS W CONTRAST  Result Date: 12/12/2020 CLINICAL DATA:  Abdominal pain, acute, nonlocalized. Pt c/o abdominal swelling x 3 months EXAM: CT ABDOMEN AND PELVIS WITH CONTRAST TECHNIQUE: Multidetector CT imaging of the abdomen and pelvis was performed using the standard protocol following bolus administration of intravenous contrast. CONTRAST:  122m OMNIPAQUE IOHEXOL 300 MG/ML  SOLN COMPARISON:  CT abdomen pelvis 06/25/2020, CT abdomen pelvis 06/10/2012 FINDINGS: Lower chest: Left lower lobe linear atelectasis. Hepatobiliary: The liver is enlarged measuring up to 23cm. vague 0.9 cm right posterior hepatic lobe hyperdensity. Similar findings within the lateral abdomen urine left hepatic lobe (2:28) measuring up to 2.5 cm. The hepatic parenchyma is diffusely hypodense compared to the splenic parenchyma consistent with fatty infiltration. No focal liver abnormality. Status post cholecystectomy. No biliary dilatation. Pancreas: No focal lesion. Normal pancreatic contour. No surrounding inflammatory changes. No main pancreatic ductal dilatation. Spleen: Normal in size without focal abnormality. Adrenals/Urinary Tract: Bilateral and renal nodularity appears similar to prior measuring up to 2 cm bilaterally with associated density of 40-50 Hounsfield units. Stomach/Bowel: Stomach is within normal limits. No evidence of bowel wall thickening or dilatation. Appendix appears normal.  Vascular/Lymphatic: No abdominal aorta or iliac aneurysm. Mild atherosclerotic plaque of the aorta and its branches. No abdominal, pelvic, or inguinal lymphadenopathy. Reproductive: T-shaped intrauterine device noted within the uterus in grossly appropriate position within the endometrial canal. Uterus and bilateral adnexa are unremarkable. Other: Trace volume simple free fluid within the abdomen or pelvis. No intraperitoneal free gas. No organized fluid collection. Musculoskeletal: Subcutaneus soft tissue edema. No suspicious lytic or blastic osseous lesions. No acute displaced fracture. Multilevel degenerative changes of the spine. IMPRESSION: 1. Hepatomegaly and hepatic steatosis with several hyperdense lesions. Recommend nonemergent MRI liver protocol further evaluation. When the patient is clinically stable and able to follow directions and hold their breath (preferably as an outpatient) further evaluation with dedicated abdominal MRI should be considered. 2. Interval development of trace simple free fluid ascites. 3. T-shaped intrauterine device in grossly appropriate position. Electronically Signed   By: MIven FinnM.D.   On: 12/12/2020 18:16    Impression: Sharon GRACEYis a 55y.o. year old female with history of CAD s/p MI in 2019, IDA, celiac disease, anxiety, ADHD, and chronic pain syndrome who presented to ED yesterday due to 3 day onset of fever and ongoing abdominal pain, nausea and vomiting. Pain was not relieved with opoioids at home and eating/drinking worsened her symptoms. She reported compliance with gluten free diet. She denied constipation or diarrhea. Previous admission 5/10-5/13 for intractable nausea and vomiting due to  celiac disease, she has been gluten free and on reglan since for gastroparesis.  Abdominal Pain/nausea:chronic. Patient has history of celiac disease as well as underlying gastroparesis. She states that abdominal pain and bloating feel like that are worse over the  past few days. She reports that occasionally she feels that bloating decreases some, enough that she can eat but then it returns. She is having 1 BM per day with constant nausea and sporadic episodes of vomiting on outpatient basis. She has had nausea and dry heaving during admission. She is receiving zofran which she reports provides some relief, but does not work as well for her as compazine does. States she follows a gluten free diet at home. She denies diarrhea or constipation.   Elevated LFTs: Improved this morning, AST 43(65), ALT 24(34), ALP 176(237), T bili remains WNL. INR 1.2, plt 455. Elevation on LFTs could be reactive state secondary to urosepsis, however, CT A/P w contrast done yesterday showed hepatomegaly and hepatic steatosis with several hyperdense lesions present, MRI with liver protocol recommended for further evaluation. Some trace simple free fluid ascites also noted on CT. Hep B surface ag and HCV ab both negative 5 months ago.  Will continue to trend LFTs.   Anemia: hgb 8.9 today. Patient denies any rectal bleeding or black stools.TIBC 118, saturation 75. Ferritin 269. Likely anemia of chronic disease.    Plan: Continue supportive measures reglan PRN anti emetics PRN Trend LFTs Trend H&H Continue PPI daily and pepcid daily  Continue gluten free diet MRI liver protocol, likely outpatient    LOS: 1 day    12/13/2020, 12:02 PM  Arriana Lohmann L. Alver Sorrow, MSN, APRN, AGNP-C Adult-Gerontology Nurse Practitioner [facility]

## 2020-12-13 NOTE — Plan of Care (Signed)

## 2020-12-14 ENCOUNTER — Encounter (HOSPITAL_COMMUNITY): Payer: Self-pay | Admitting: Hematology

## 2020-12-14 LAB — CBC WITH DIFFERENTIAL/PLATELET
Abs Immature Granulocytes: 0.1 10*3/uL — ABNORMAL HIGH (ref 0.00–0.07)
Basophils Absolute: 0 10*3/uL (ref 0.0–0.1)
Basophils Relative: 0 %
Eosinophils Absolute: 0 10*3/uL (ref 0.0–0.5)
Eosinophils Relative: 1 %
HCT: 24.1 % — ABNORMAL LOW (ref 36.0–46.0)
Hemoglobin: 7.9 g/dL — ABNORMAL LOW (ref 12.0–15.0)
Immature Granulocytes: 1 %
Lymphocytes Relative: 29 %
Lymphs Abs: 2.3 10*3/uL (ref 0.7–4.0)
MCH: 29.4 pg (ref 26.0–34.0)
MCHC: 32.8 g/dL (ref 30.0–36.0)
MCV: 89.6 fL (ref 80.0–100.0)
Monocytes Absolute: 0.7 10*3/uL (ref 0.1–1.0)
Monocytes Relative: 9 %
Neutro Abs: 4.8 10*3/uL (ref 1.7–7.7)
Neutrophils Relative %: 60 %
Platelets: 520 10*3/uL — ABNORMAL HIGH (ref 150–400)
RBC: 2.69 MIL/uL — ABNORMAL LOW (ref 3.87–5.11)
RDW: 23.7 % — ABNORMAL HIGH (ref 11.5–15.5)
Smear Review: NORMAL
WBC: 8 10*3/uL (ref 4.0–10.5)
nRBC: 0 % (ref 0.0–0.2)

## 2020-12-14 LAB — COMPREHENSIVE METABOLIC PANEL
ALT: 21 U/L (ref 0–44)
AST: 62 U/L — ABNORMAL HIGH (ref 15–41)
Albumin: 2.8 g/dL — ABNORMAL LOW (ref 3.5–5.0)
Alkaline Phosphatase: 163 U/L — ABNORMAL HIGH (ref 38–126)
Anion gap: 8 (ref 5–15)
BUN: 6 mg/dL (ref 6–20)
CO2: 22 mmol/L (ref 22–32)
Calcium: 7.5 mg/dL — ABNORMAL LOW (ref 8.9–10.3)
Chloride: 107 mmol/L (ref 98–111)
Creatinine, Ser: 0.61 mg/dL (ref 0.44–1.00)
GFR, Estimated: >60 mL/min (ref >60)
Glucose, Bld: 72 mg/dL (ref 70–99)
Potassium: 4 mmol/L (ref 3.5–5.1)
Sodium: 137 mmol/L (ref 135–145)
Total Bilirubin: 1.2 mg/dL (ref 0.3–1.2)
Total Protein: 5.1 g/dL — ABNORMAL LOW (ref 6.5–8.1)

## 2020-12-14 LAB — MAGNESIUM: Magnesium: 2.3 mg/dL (ref 1.7–2.4)

## 2020-12-14 NOTE — Progress Notes (Signed)
Patient received as a transfer from ICU via wheelchair.  Patient is A&O x 4, ambulatory with minimal assist.  Oriented to room and unit routine, call bell within reach.  No complaints at this time, needs addressed.

## 2020-12-14 NOTE — Progress Notes (Signed)
Subjective:    Objective: Vital signs in last 24 hours: Temp:  [97.8 F (36.6 C)-99.1 F (37.3 C)] 98.9 F (37.2 C) (10/13 1112) Pulse Rate:  [83-104] 94 (10/13 1112) Resp:  [12-26] 18 (10/13 1112) BP: (88-114)/(58-74) 90/72 (10/13 1100) SpO2:  [92 %-99 %] 98 % (10/13 1112) Last BM Date: 12/12/20 General:   Alert,  Well-developed, well-nourished, pleasant and cooperative in NAD Head:  Normocephalic and atraumatic. Eyes:  Sclera clear, no icterus.  Chest: CTA bilaterally without rales, rhonchi, crackles.    Heart:  Regular rate and rhythm; no murmurs, clicks, rubs,  or gallops. Abdomen:  Soft, nontender and nondistended. No masses, hepatosplenomegaly or hernias noted. Normal bowel sounds, without guarding, and without rebound.   Extremities:  Without clubbing, deformity or edema. Neurologic:  Alert and  oriented x4;  grossly normal neurologically. Skin:  Intact without significant lesions or rashes. Psych:  Alert and cooperative. Normal mood and affect.  Intake/Output from previous day: 10/12 0701 - 10/13 0700 In: 578.3 [P.O.:120; I.V.:358.3; IV Piggyback:100] Out: 2325 [Urine:2325] Intake/Output this shift: No intake/output data recorded.  Lab Results: CBC Recent Labs    12/12/20 1538 12/13/20 0435 12/13/20 0708 12/14/20 0443  WBC 18.2* 11.2*  --  8.0  HGB 11.2* 8.4* 8.9* 7.9*  HCT 35.6* 25.0* 26.8* 24.1*  MCV 93.7 89.0  --  89.6  PLT 561* 455*  --  520*   BMET Recent Labs    12/12/20 1532 12/13/20 0435 12/14/20 0443  NA 133* 135 137  K 3.9 3.1* 4.0  CL 99 107 107  CO2 21* 22 22  GLUCOSE 75 85 72  BUN 8 7 6   CREATININE 0.69 0.60 0.61  CALCIUM 7.8* 6.8* 7.5*   LFTs Recent Labs    12/12/20 1532 12/13/20 0435 12/14/20 0443  BILITOT 1.3* 1.0 1.2  ALKPHOS 237* 176* 163*  AST 65* 43* 62*  ALT 34 24 21  PROT 6.2* 4.5* 5.1*  ALBUMIN 2.6* 1.9* 2.8*   Recent Labs    12/12/20 1532  LIPASE 16   PT/INR Recent Labs    12/13/20 0435   LABPROT 15.0  INR 1.2      Imaging Studies: CT ABDOMEN PELVIS W CONTRAST  Result Date: 12/12/2020 CLINICAL DATA:  Abdominal pain, acute, nonlocalized. Pt c/o abdominal swelling x 3 months EXAM: CT ABDOMEN AND PELVIS WITH CONTRAST TECHNIQUE: Multidetector CT imaging of the abdomen and pelvis was performed using the standard protocol following bolus administration of intravenous contrast. CONTRAST:  163m OMNIPAQUE IOHEXOL 300 MG/ML  SOLN COMPARISON:  CT abdomen pelvis 06/25/2020, CT abdomen pelvis 06/10/2012 FINDINGS: Lower chest: Left lower lobe linear atelectasis. Hepatobiliary: The liver is enlarged measuring up to 23cm. vague 0.9 cm right posterior hepatic lobe hyperdensity. Similar findings within the lateral abdomen urine left hepatic lobe (2:28) measuring up to 2.5 cm. The hepatic parenchyma is diffusely hypodense compared to the splenic parenchyma consistent with fatty infiltration. No focal liver abnormality. Status post cholecystectomy. No biliary dilatation. Pancreas: No focal lesion. Normal pancreatic contour. No surrounding inflammatory changes. No main pancreatic ductal dilatation. Spleen: Normal in size without focal abnormality. Adrenals/Urinary Tract: Bilateral and renal nodularity appears similar to prior measuring up to 2 cm bilaterally with associated density of 40-50 Hounsfield units. Stomach/Bowel: Stomach is within normal limits. No evidence of bowel wall thickening or dilatation. Appendix appears normal. Vascular/Lymphatic: No abdominal aorta or iliac aneurysm. Mild atherosclerotic plaque of the aorta and its branches. No abdominal, pelvic, or inguinal lymphadenopathy. Reproductive:  T-shaped intrauterine device noted within the uterus in grossly appropriate position within the endometrial canal. Uterus and bilateral adnexa are unremarkable. Other: Trace volume simple free fluid within the abdomen or pelvis. No intraperitoneal free gas. No organized fluid collection. Musculoskeletal:  Subcutaneus soft tissue edema. No suspicious lytic or blastic osseous lesions. No acute displaced fracture. Multilevel degenerative changes of the spine. IMPRESSION: 1. Hepatomegaly and hepatic steatosis with several hyperdense lesions. Recommend nonemergent MRI liver protocol further evaluation. When the patient is clinically stable and able to follow directions and hold their breath (preferably as an outpatient) further evaluation with dedicated abdominal MRI should be considered. 2. Interval development of trace simple free fluid ascites. 3. T-shaped intrauterine device in grossly appropriate position. Electronically Signed   By: Iven Finn M.D.   On: 12/12/2020 18:16  [2 weeks]   Assessment: Sharon Lawrence is a 55 y.o. year old female with history of CAD s/p MI in 2019, IDA, celiac disease, colon polyps, anxiety, ADHD, and chronic pain syndrome who presented to ED yesterday due to 3 day onset of fever and ongoing abdominal pain, nausea and vomiting. Pain was not relieved with opoioids at home and eating/drinking worsened her symptoms. She reported compliance with gluten free diet. She denied constipation or diarrhea. Previous admission 5/10-5/13 for intractable nausea and vomiting due to celiac disease, she has been gluten free and on reglan since for gastroparesis.  Abdominal pain/nausea: Acute on chronic chronic in nature.  Likely multifactorial.  H/o celiac disease and ?gastroparesis. Over past 3 months has had bloating and upper abdominal discomfort.  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  She has had no further vomiting since day of admission but appetite remains poor. Denies bowel concerns. At this time no indication for EGD. EGD 07/2020:Congestion and erythematous mucosa in the antrum status post biopsy.  Mucosal changes in the duodenum.  Pronounced changes suggesting active celiac disease?  Refractory disease versus noncompliance.   Biopsied-chronic duodenitis consistent with celiac, mild chronic gastritis with negative H.pylori.  Anasarca: Due to hypoalbuminemia.  Started on albumin and Lasix.  Imaging studies do not show cirrhosis but could have impaired hepatic synthesis due to severe fatty liver.  Primary reason felt to be due to poor oral intake.  Notes pitting edema in the thighs and buttocks over the past several weeks.   Elevated LFTs: Chronically elevated.  Alkaline phosphatase up to 509 back in May, 237 on admission, down to 163 today.  Total bilirubin normal today.  Her AST is stable at 62. Hepatitis B surface antigen, hepatitis C antibody negative 5 months ago.  Mitochondrial antibodies negative.  CT A/P with contrast showed hepatomegaly and hepatic steatosis with several hyperdense lesions present.  2 of these densities are old, third 1 along posterior aspect of the right lobe is new and will require further evaluation via MRI with and without contrast which can be done as an outpatient.    Anemia: Hemoglobin 8.9 yesterday.  Denies overt GI bleeding.  Iron saturation 75% (normal 1 year ago), iron 89, ferritin 269.  Celiac disease: Check TTG IgA.  Continue gluten-free diet.  Gastroparesis: He has metoclopramide on an as-needed basis only.  History of colonic polyps: not removed at time of colonoscopy in July 2019, completed while on dual antiplatelet therapy for STEMI that she has suffered 12 days prior.  Needs colonoscopy for polypectomy.  Plan for later this year.    Plan: MRI liver with  and without contrast as an outpatient. Therapeutic colonoscopy as an outpatient. Continue IV albumin/Lasix. Daily weights. Continue metoclopramide as needed only. Check TTG IgA.  Laureen Ochs. Bernarda Caffey [facility] [phone] 10/13/20221:08 PM    LOS: 2 days

## 2020-12-14 NOTE — Progress Notes (Signed)
PROGRESS NOTE   Sharon Lawrence  FOY:[id number] DOB: 1965/10/22 DOA: 12/12/2020 PCP: Pcp, No   Chief Complaint  Patient presents with   Fever   Level of care: Med-Surg  Brief Admission History:  Sharon Lawrence is a 55 y.o. year old female with history of CAD s/p MI in 2019, IDA, celiac disease, colon polyps, anxiety, ADHD, and chronic pain syndrome who presented to ED due to 3 day onset of fever and ongoing abdominal pain, nausea and vomiting. Pain was not relieved with opoioids at home and eating/drinking worsened her symptoms. She reported compliance with gluten free diet. She denied constipation or diarrhea. Previous admission 5/10-5/13 for intractable nausea and vomiting due to celiac disease, she has been gluten free and on reglan since for gastroparesis. Noted to be septic from UTI.  Sepsis from UTI has been treated aggressively and lactic acid has normalized after IV fluids.  Unfortunately patient now has 3rd spacing of fluid seen mostly in lower extremities and lower abdomen.  IV lasix ordered. IV albumin has also been given, edema is improving.    Assessment & Plan:   Principal Problem:   Sepsis secondary to UTI ([place]) -wbc initial 18.2 10/11, 10/12 is 11.2, 10/13 8.0 -lactic acid was 4.5, now 10/12 1.1, resolved -bicarb was 20, now 22 -urine with positive nitrites, large leukocyte, many bacteria -blood cultures no growth, 1 day   Active Problems:    Abdominal pain, due to hepatomegaly, with nausea and vomiting, and transaminitis -initial ast 65, 10/12 is 43, 10/13  62    has been markedly elevated previously -alk phos was 237, now 10/12 176,  10/13 163  has been markedly elevated previously -GI consulted, per GI: Chronically elevated.  Alkaline phosphatase up to 509 back in May, 237 on admission, down to 163 today.  Total bilirubin normal today.  Her AST is stable at 62. Hepatitis B surface antigen, hepatitis C antibody negative 5 months ago.  Mitochondrial antibodies negative.  CT A/P  with contrast showed hepatomegaly and hepatic steatosis with several hyperdense lesions present.  2 of these densities are old, third 1 along posterior aspect of the right lobe is new and will require further evaluation via MRI with and without contrast which can be done as an outpatient.       Thrombocytosis -10/11 561, 10/12 455, 10/13 520    adequately hydrated, elevated in the past, will continue to monitor and discuss with hematology if needed     Leukocytosis and Lactic acidosis -secondary to uti and sepsis     Hypoalbuminemia due to protein-calorie malnutrition (Sharon Lawrence) -Gi consulted -Nutrition consulted -low serum protein 4.5, low serum albumin 1.9 10/12 -10/13 albumin 2.8 after IV albumin -continue albumin and lasix     Hyponatremia -Initial 10/11 133, now resolved     Anemia -10/12  hemoglobin is 8.4, will continue to trend and intervene accordingly -high saturation 75% -low tibc 187 -higher ferritin 269 -concerning for anemia of chronic disease -denies blood in bowel or urine  Gastroparesis -metoclopramide as needed -nutritional supplements to increase protein intake    DVT prophylaxis: lovenox Code Status: full Family Communication: none at bedside Disposition:  resolution of current uti, GI consult, expect home with outpatient followup   Status is: Inpatient  Remains inpatient appropriate because: requiring IV antibiotics       Consultants:  GI Nutrition  Procedures:  Ct abdomen  Antimicrobials:  Levofloxacin 10/11-current Metronidazole 10/11-10/13 Fluconazole 10/12   Subjective: She reports improved nausea, improved edema  and reduced bloating feeling  Objective: Vitals:   12/14/20 1112 12/14/20 1200 12/14/20 1300 12/14/20 1411  BP:  110/75 114/67 104/68  Pulse: 94 99 99 99  Resp: _0 Temp: 98.9 F (37.2 C)     TempSrc: Oral     SpO2: 98% 96% 98% 96%  Weight:      Height:        Intake/Output Summary (Last 24 hours) at  12/14/2020 1512 Last data filed at 12/14/2020 1439 Gross per 24 hour  Intake 895.35 ml  Output 1475 ml  Net -579.65 ml   Filed Weights   12/12/20 1737 12/12/20 2314  Weight: 82.6 kg 94 kg    Examination:  General exam: Appears tired, calm, pale  Respiratory system: Clear to auscultation. Respiratory effort normal.  Cardiovascular system: normal S1 & S2 heard. No JVD, murmurs, rubs, gallops or clicks. Significant pedal edema, right leg especially, low serum protein  Gastrointestinal system: Abdomen is nondistended, soft and tender in the upper quadrants, normal bowel sounds heard  Central nervous system: Alert and oriented. No focal neurological deficits.  Extremities: moves all extremities appropriately  Skin: bruising noted on arms and legs, has not worsened  Psychiatry: Judgement and insight appear normal. Mood & affect appropriate.   Data Reviewed: I have personally reviewed following labs and imaging studies  CBC: Recent Labs  Lab 12/12/20 1538 12/13/20 0435 12/13/20 0708 12/14/20 0443  WBC 18.2* 11.2*  --  8.0  NEUTROABS 12.7*  --   --  4.8  HGB 11.2* 8.4* 8.9* 7.9*  HCT 35.6* 25.0* 26.8* 24.1*  MCV 93.7 89.0  --  89.6  PLT 561* 455*  --  520*    Basic Metabolic Panel: Recent Labs  Lab 12/12/20 1532 12/13/20 0435 12/14/20 0443  NA 133* 135 137  K 3.9 3.1* 4.0  CL 99 107 107  CO2 21* 22 22  GLUCOSE 75 85 72  BUN _1 CREATININE 0.69 0.60 0.61  CALCIUM 7.8* 6.8* 7.5*  MG  --  1.7 2.3  PHOS  --  2.7  --     GFR: Estimated Creatinine Clearance: 83.3 mL/min (by C-G formula based on SCr of 0.61 mg/dL).  Liver Function Tests: Recent Labs  Lab 12/12/20 1532 12/13/20 0435 12/14/20 0443  AST 65* 43* 62*  ALT 34 24 21  ALKPHOS 237* 176* 163*  BILITOT 1.3* 1.0 1.2  PROT 6.2* 4.5* 5.1*  ALBUMIN 2.6* 1.9* 2.8*    CBG: No results for input(s): GLUCAP in the last 168 hours.  Recent Results (from the past 240 hour(s))  Urine Culture      Status: Abnormal (Preliminary result)   Collection Time: 12/12/20  4:58 PM   Specimen: Urine, Clean Catch  Result Value Ref Range Status   Specimen Description   Final    URINE, CLEAN CATCH Performed at [facility], [address], [place], [place] [address]    Special Requests   Final    NONE Performed at [facility], [address] [place], [place] [address]    Culture (A)  Final    >=100,000 COLONIES/mL ESCHERICHIA COLI >=100,000 COLONIES/mL PROTEUS MIRABILIS CULTURE REINCUBATED FOR BETTER GROWTH SUSCEPTIBILITIES TO FOLLOW Performed at [facility] [address], [place], [place] [address]    Report Status PENDING  Incomplete  Resp Panel by RT-PCR (Flu A&B, Covid)     Status: None   Collection Time: 12/12/20  8:05 PM  Result Value Ref Range  Status   SARS Coronavirus 2 by RT PCR NEGATIVE NEGATIVE Final    Comment: (NOTE) SARS-CoV-2 target nucleic acids are NOT DETECTED.  The SARS-CoV-2 RNA is generally detectable in upper respiratory specimens during the acute phase of infection. The lowest concentration of SARS-CoV-2 viral copies this assay can detect is 138 copies/mL. A negative result does not preclude SARS-Cov-2 infection and should not be used as the sole basis for treatment or other patient management decisions. A negative result may occur with  improper specimen collection/handling, submission of specimen other than nasopharyngeal swab, presence of viral mutation(s) within the areas targeted by this assay, and inadequate number of viral copies(<138 copies/mL). A negative result must be combined with clinical observations, patient history, and epidemiological information. The expected result is Negative.  Fact Sheet for Patients:  EntrepreneurPulse.com.au  Fact Sheet for Healthcare Providers:  IncredibleEmployment.be  This test is no t yet approved or cleared by the [place] FDA and  has been authorized for  detection and/or diagnosis of SARS-CoV-2 by FDA under an Emergency Use Authorization (EUA). This EUA will remain  in effect (meaning this test can be used) for the duration of the COVID-19 declaration under Section 564(b)(1) of the Act, 21 U.S.C.section 360bbb-3(b)(1), unless the authorization is terminated  or revoked sooner.       Influenza A by PCR NEGATIVE NEGATIVE Final   Influenza B by PCR NEGATIVE NEGATIVE Final    Comment: (NOTE) The Xpert Xpress SARS-CoV-2/FLU/RSV plus assay is intended as an aid in the diagnosis of influenza from Nasopharyngeal swab specimens and should not be used as a sole basis for treatment. Nasal washings and aspirates are unacceptable for Xpert Xpress SARS-CoV-2/FLU/RSV testing.  Fact Sheet for Patients: EntrepreneurPulse.com.au  Fact Sheet for Healthcare Providers: IncredibleEmployment.be  This test is not yet approved or cleared by the [place] FDA and has been authorized for detection and/or diagnosis of SARS-CoV-2 by FDA under an Emergency Use Authorization (EUA). This EUA will remain in effect (meaning this test can be used) for the duration of the COVID-19 declaration under Section 564(b)(1) of the Act, 21 U.S.C. section 360bbb-3(b)(1), unless the authorization is terminated or revoked.  Performed at [facility], [address] [place], [place] [address]   MRSA Next Gen by PCR, Nasal     Status: None   Collection Time: 12/12/20 10:51 PM   Specimen: Nasal Mucosa; Nasal Swab  Result Value Ref Range Status   MRSA by PCR Next Gen NOT DETECTED NOT DETECTED Final    Comment: (NOTE) The GeneXpert MRSA Assay (FDA approved for NASAL specimens only), is one component of a comprehensive MRSA colonization surveillance program. It is not intended to diagnose MRSA infection nor to guide or monitor treatment for MRSA infections. Test performance is not FDA approved in patients less than 70  years old. Performed at [facility], [address] [place], [place] [address]   Culture, blood (Routine X 2) w Reflex to ID Panel     Status: None (Preliminary result)   Collection Time: 12/13/20  1:35 AM   Specimen: BLOOD  Result Value Ref Range Status   Specimen Description BLOOD BLOOD RIGHT HAND  Final   Special Requests   Final    BOTTLES DRAWN AEROBIC AND ANAEROBIC Blood Culture adequate volume   Culture   Final    NO GROWTH 1 DAY Performed at [facility], [address] [place], [place] [address]    Report Status PENDING  Incomplete  Culture, blood (Routine X  2) w Reflex to ID Panel     Status: None (Preliminary result)   Collection Time: 12/13/20  1:35 AM   Specimen: BLOOD  Result Value Ref Range Status   Specimen Description BLOOD BLOOD LEFT HAND  Final   Special Requests   Final    BOTTLES DRAWN AEROBIC AND ANAEROBIC Blood Culture adequate volume   Culture   Final    NO GROWTH 1 DAY Performed at [facility], [address], [place], [place] [address]    Report Status PENDING  Incomplete     Radiology Studies: CT ABDOMEN PELVIS W CONTRAST  Result Date: 12/12/2020 CLINICAL DATA:  Abdominal pain, acute, nonlocalized. Pt c/o abdominal swelling x 3 months EXAM: CT ABDOMEN AND PELVIS WITH CONTRAST TECHNIQUE: Multidetector CT imaging of the abdomen and pelvis was performed using the standard protocol following bolus administration of intravenous contrast. CONTRAST:  153m OMNIPAQUE IOHEXOL 300 MG/ML  SOLN COMPARISON:  CT abdomen pelvis 06/25/2020, CT abdomen pelvis 06/10/2012 FINDINGS: Lower chest: Left lower lobe linear atelectasis. Hepatobiliary: The liver is enlarged measuring up to 23cm. vague 0.9 cm right posterior hepatic lobe hyperdensity. Similar findings within the lateral abdomen urine left hepatic lobe (2:28) measuring up to 2.5 cm. The hepatic parenchyma is diffusely hypodense compared to the splenic parenchyma consistent with fatty infiltration. No focal liver  abnormality. Status post cholecystectomy. No biliary dilatation. Pancreas: No focal lesion. Normal pancreatic contour. No surrounding inflammatory changes. No main pancreatic ductal dilatation. Spleen: Normal in size without focal abnormality. Adrenals/Urinary Tract: Bilateral and renal nodularity appears similar to prior measuring up to 2 cm bilaterally with associated density of 40-50 Hounsfield units. Stomach/Bowel: Stomach is within normal limits. No evidence of bowel wall thickening or dilatation. Appendix appears normal. Vascular/Lymphatic: No abdominal aorta or iliac aneurysm. Mild atherosclerotic plaque of the aorta and its branches. No abdominal, pelvic, or inguinal lymphadenopathy. Reproductive: T-shaped intrauterine device noted within the uterus in grossly appropriate position within the endometrial canal. Uterus and bilateral adnexa are unremarkable. Other: Trace volume simple free fluid within the abdomen or pelvis. No intraperitoneal free gas. No organized fluid collection. Musculoskeletal: Subcutaneus soft tissue edema. No suspicious lytic or blastic osseous lesions. No acute displaced fracture. Multilevel degenerative changes of the spine. IMPRESSION: 1. Hepatomegaly and hepatic steatosis with several hyperdense lesions. Recommend nonemergent MRI liver protocol further evaluation. When the patient is clinically stable and able to follow directions and hold their breath (preferably as an outpatient) further evaluation with dedicated abdominal MRI should be considered. 2. Interval development of trace simple free fluid ascites. 3. T-shaped intrauterine device in grossly appropriate position. Electronically Signed   By: MIven FinnM.D.   On: 12/12/2020 18:16    Scheduled Meds:  Chlorhexidine Gluconate Cloth  6 each Topical Daily   enoxaparin (LOVENOX) injection  40 mg Subcutaneous Q24H   famotidine  20 mg Oral QHS   feeding supplement  1 Container Oral TID BM   fluconazole  150 mg Oral  Daily   ketoconazole   Topical BID   multivitamin with minerals  1 tablet Oral Daily   pantoprazole  40 mg Oral Daily   Continuous Infusions:  albumin human Stopped (12/14/20 1350)   lactated ringers     And   lactated ringers     And   lactated ringers     lactated ringers 30 mL/hr at 12/14/20 1439   levofloxacin (LEVAQUIN) IV 750 mg (12/13/20 1818)     LOS: 2 days    Zalayah Pizzuto D  Haris Baack, student How to contact the [facility] Attending or Consulting provider [place] or covering provider during after hours [place], for this patient?  Check the care team in [facility] and look for a) attending/consulting [facility] provider listed and b) the [facility] team listed Log into [email or web address] and use [place]'s universal password to access. If you do not have the password, please contact the hospital operator. Locate the [facility] provider you are looking for under [facility] and page to a number that you can be directly reached. If you still have difficulty reaching the provider, please page the [facility] (Director on Call) for the Hospitalists listed on amion for assistance.  12/14/2020, 3:12 PM

## 2020-12-15 ENCOUNTER — Telehealth: Payer: Self-pay | Admitting: Gastroenterology

## 2020-12-15 LAB — CBC WITH DIFFERENTIAL/PLATELET
Abs Immature Granulocytes: 0.14 10*3/uL — ABNORMAL HIGH (ref 0.00–0.07)
Basophils Absolute: 0 10*3/uL (ref 0.0–0.1)
Basophils Relative: 1 %
Eosinophils Absolute: 0.1 10*3/uL (ref 0.0–0.5)
Eosinophils Relative: 1 %
HCT: 24.6 % — ABNORMAL LOW (ref 36.0–46.0)
Hemoglobin: 8.1 g/dL — ABNORMAL LOW (ref 12.0–15.0)
Immature Granulocytes: 2 %
Lymphocytes Relative: 33 %
Lymphs Abs: 2.6 10*3/uL (ref 0.7–4.0)
MCH: 30.2 pg (ref 26.0–34.0)
MCHC: 32.9 g/dL (ref 30.0–36.0)
MCV: 91.8 fL (ref 80.0–100.0)
Monocytes Absolute: 0.7 10*3/uL (ref 0.1–1.0)
Monocytes Relative: 9 %
Neutro Abs: 4.3 10*3/uL (ref 1.7–7.7)
Neutrophils Relative %: 54 %
Platelets: 544 10*3/uL — ABNORMAL HIGH (ref 150–400)
RBC: 2.68 MIL/uL — ABNORMAL LOW (ref 3.87–5.11)
RDW: 25 % — ABNORMAL HIGH (ref 11.5–15.5)
WBC: 7.8 10*3/uL (ref 4.0–10.5)
nRBC: 0.4 % — ABNORMAL HIGH (ref 0.0–0.2)

## 2020-12-15 LAB — COMPREHENSIVE METABOLIC PANEL
ALT: 21 U/L (ref 0–44)
AST: 75 U/L — ABNORMAL HIGH (ref 15–41)
Albumin: 3.1 g/dL — ABNORMAL LOW (ref 3.5–5.0)
Alkaline Phosphatase: 188 U/L — ABNORMAL HIGH (ref 38–126)
Anion gap: 7 (ref 5–15)
BUN: <5 mg/dL — ABNORMAL LOW (ref 6–20)
CO2: 23 mmol/L (ref 22–32)
Calcium: 8 mg/dL — ABNORMAL LOW (ref 8.9–10.3)
Chloride: 110 mmol/L (ref 98–111)
Creatinine, Ser: 0.7 mg/dL (ref 0.44–1.00)
GFR, Estimated: >60 mL/min (ref >60)
Glucose, Bld: 91 mg/dL (ref 70–99)
Potassium: 3.6 mmol/L (ref 3.5–5.1)
Sodium: 140 mmol/L (ref 135–145)
Total Bilirubin: 0.5 mg/dL (ref 0.3–1.2)
Total Protein: 5.2 g/dL — ABNORMAL LOW (ref 6.5–8.1)

## 2020-12-15 LAB — MAGNESIUM: Magnesium: 2 mg/dL (ref 1.7–2.4)

## 2020-12-15 MED ORDER — POTASSIUM CHLORIDE CRYS ER 20 MEQ PO TBCR
40.0000 meq | EXTENDED_RELEASE_TABLET | Freq: Once | ORAL | Status: AC
  Administered 2020-12-15: 40 meq via ORAL
  Filled 2020-12-15: qty 2

## 2020-12-15 MED ORDER — FUROSEMIDE 10 MG/ML IJ SOLN
40.0000 mg | Freq: Every day | INTRAMUSCULAR | Status: DC
  Administered 2020-12-15 – 2020-12-16 (×2): 40 mg via INTRAVENOUS
  Filled 2020-12-15 (×2): qty 4

## 2020-12-15 NOTE — Progress Notes (Signed)
Subjective:  Feels better today. Feels like fluid status has improved but still long ways to go. Tolerating diet. No abdominal pain.  Objective: Vital signs in last 24 hours: Temp:  [98.4 F (36.9 C)-98.8 F (37.1 C)] 98.8 F (37.1 C) (10/14 1242) Pulse Rate:  [97-103] 103 (10/14 1242) Resp:  [18-20] 18 (10/14 1242) BP: (100-110)/(57-73) 110/71 (10/14 1242) SpO2:  [99 %-100 %] 100 % (10/14 1242) Last BM Date: 12/12/20 General:   Alert,  Well-developed, well-nourished, pleasant and cooperative in NAD Head:  Normocephalic and atraumatic. Eyes:  Sclera clear, no icterus.  Abdomen:  Soft, nontender and nondistended.  Normal bowel sounds, without guarding, and without rebound.   Extremities:  Without clubbing, deformity.  2+ pitting edema below the knees, 1+ thighs bilaterally.  Similar to yesterday's exam all day I did not document it yesterday.  Neurologic:  Alert and  oriented x4;  grossly normal neurologically. Skin:  Intact without significant lesions or rashes. Psych:  Alert and cooperative. Normal mood and affect.  Intake/Output from previous day: 10/13 0701 - 10/14 0700 In: 1074.9 [P.O.:240; I.V.:284.9; IV Piggyback:550] Out: 675 [Urine:675] Intake/Output this shift: Total I/O In: 600 [P.O.:600] Out: -   Lab Results: CBC Recent Labs    12/13/20 0435 12/13/20 0708 12/14/20 0443 12/15/20 0612  WBC 11.2*  --  8.0 7.8  HGB 8.4* 8.9* 7.9* 8.1*  HCT 25.0* 26.8* 24.1* 24.6*  MCV 89.0  --  89.6 91.8  PLT 455*  --  520* 544*   BMET Recent Labs    12/13/20 0435 12/14/20 0443 12/15/20 0612  NA 135 137 140  K 3.1* 4.0 3.6  CL 107 107 110  CO2 22 22 23   GLUCOSE 85 72 91  BUN 7 6 <5*  CREATININE 0.60 0.61 0.70  CALCIUM 6.8* 7.5* 8.0*   LFTs Recent Labs    12/13/20 0435 12/14/20 0443 12/15/20 0612  BILITOT 1.0 1.2 0.5  ALKPHOS 176* 163* 188*  AST 43* 62* 75*  ALT 24 21 21   PROT 4.5* 5.1* 5.2*  ALBUMIN 1.9* 2.8* 3.1*   Recent Labs    12/12/20 1532   LIPASE 16   PT/INR Recent Labs    12/13/20 0435  LABPROT 15.0  INR 1.2      Imaging Studies: CT ABDOMEN PELVIS W CONTRAST  Result Date: 12/12/2020 CLINICAL DATA:  Abdominal pain, acute, nonlocalized. Pt c/o abdominal swelling x 3 months EXAM: CT ABDOMEN AND PELVIS WITH CONTRAST TECHNIQUE: Multidetector CT imaging of the abdomen and pelvis was performed using the standard protocol following bolus administration of intravenous contrast. CONTRAST:  175m OMNIPAQUE IOHEXOL 300 MG/ML  SOLN COMPARISON:  CT abdomen pelvis 06/25/2020, CT abdomen pelvis 06/10/2012 FINDINGS: Lower chest: Left lower lobe linear atelectasis. Hepatobiliary: The liver is enlarged measuring up to 23cm. vague 0.9 cm right posterior hepatic lobe hyperdensity. Similar findings within the lateral abdomen urine left hepatic lobe (2:28) measuring up to 2.5 cm. The hepatic parenchyma is diffusely hypodense compared to the splenic parenchyma consistent with fatty infiltration. No focal liver abnormality. Status post cholecystectomy. No biliary dilatation. Pancreas: No focal lesion. Normal pancreatic contour. No surrounding inflammatory changes. No main pancreatic ductal dilatation. Spleen: Normal in size without focal abnormality. Adrenals/Urinary Tract: Bilateral and renal nodularity appears similar to prior measuring up to 2 cm bilaterally with associated density of 40-50 Hounsfield units. Stomach/Bowel: Stomach is within normal limits. No evidence of bowel wall thickening or dilatation. Appendix appears normal. Vascular/Lymphatic: No abdominal aorta or iliac aneurysm. Mild atherosclerotic plaque  of the aorta and its branches. No abdominal, pelvic, or inguinal lymphadenopathy. Reproductive: T-shaped intrauterine device noted within the uterus in grossly appropriate position within the endometrial canal. Uterus and bilateral adnexa are unremarkable. Other: Trace volume simple free fluid within the abdomen or pelvis. No intraperitoneal  free gas. No organized fluid collection. Musculoskeletal: Subcutaneus soft tissue edema. No suspicious lytic or blastic osseous lesions. No acute displaced fracture. Multilevel degenerative changes of the spine. IMPRESSION: 1. Hepatomegaly and hepatic steatosis with several hyperdense lesions. Recommend nonemergent MRI liver protocol further evaluation. When the patient is clinically stable and able to follow directions and hold their breath (preferably as an outpatient) further evaluation with dedicated abdominal MRI should be considered. 2. Interval development of trace simple free fluid ascites. 3. T-shaped intrauterine device in grossly appropriate position. Electronically Signed   By: Iven Finn M.D.   On: 12/12/2020 18:16  [2 weeks]   Assessment: Sharon Lawrence is a 55 y.o. year old female with history of CAD s/p MI in 2019, IDA, celiac disease, colon polyps, anxiety, ADHD, and chronic pain syndrome who presented to ED yesterday due to 3 day onset of fever and ongoing abdominal pain, nausea and vomiting. Pain was not relieved with opioids at home and eating/drinking worsened her symptoms. She reported compliance with gluten free diet. She denied constipation or diarrhea. Previous admission 5/10-5/13 for intractable nausea and vomiting due to celiac disease, she has been gluten free and on reglan since for gastroparesis.  Abdominal pain/nausea: Acute on chronic chronic in nature.  Likely multifactorial.  H/o celiac disease and ?gastroparesis. Over past 3 months has had bloating and upper abdominal discomfort.  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 EGD 07/2020:Congestion and erythematous mucosa in the antrum status post biopsy.  Mucosal changes in the duodenum.  Pronounced changes suggesting active celiac disease?  Refractory disease versus noncompliance.  Biopsied-chronic duodenitis consistent with celiac, mild chronic gastritis with negative  H.pylori.  Currently much improved.  Elevated LFTs: Chronically elevated.  Alkaline phosphatase up to 509 back in May, 237 on admission, down to 163 today.  Total bilirubin normal today.  Her AST is stable at 62. Hepatitis B surface antigen, hepatitis C antibody negative 5 months ago.  Mitochondrial antibodies negative.  CT A/P with contrast showed hepatomegaly and hepatic steatosis with several hyperdense lesions present.  2 of these densities are old, third 1 along posterior aspect of the right lobe is new and will require further evaluation via MRI with and without contrast which can be done as an outpatient.    Anemia: Hemoglobin 8.9 yesterday.  Remained stable.  Denies overt GI bleeding.  Iron saturation 75% (normal 1 year ago), iron 89, ferritin 269.  Celiac disease: Check TTG IgA.  Continue gluten-free diet.   Gastroparesis: He has metoclopramide on an as-needed basis only.   History of colonic polyps: not removed at time of colonoscopy in July 2019, completed while on dual antiplatelet therapy for STEMI that she has suffered 12 days prior.  Needs colonoscopy for polypectomy.  Plan for later this year.  Plan: Continue IV albumin/Lasix. Obtain daily weights. Therapeutic colonoscopy as outpatient. MRI liver with and without contrast as an outpatient. Follow-up on pending TTG. Hopefully home in the next 24 hours.   Laureen Ochs. [facility] [phone] 10/14/20222:55 PM    LOS: 3 days

## 2020-12-15 NOTE — Progress Notes (Signed)
Pharmacy Antibiotic Note  Sharon Lawrence is a 55 y.o. female admitted on 12/12/2020 with  intra-abdominal infection .  Pharmacy has been consulted for Levaquin dosing. Patient improving, infection due to UTI. E.Coli is pansensitive, waiting on proteus  Plan: Levaquin 720m IV q24h F/U cxs and clinical progress Monitor V/S, labs  F/u transition to po   Height: 5' (152.4 cm) Weight: 94 kg (207 lb 3.7 oz) IBW/kg (Calculated) : 45.5  Temp (24hrs), Avg:98.7 F (37.1 C), Min:98.4 F (36.9 C), Max:98.8 F (37.1 C)  Recent Labs  Lab 12/12/20 1532 12/12/20 1538 12/12/20 2019 12/12/20 2226 12/13/20 0135 12/13/20 0435 12/13/20 0733 12/14/20 0443 12/15/20 0612  WBC  --  18.2*  --   --   --  11.2*  --  8.0 7.8  CREATININE 0.69  --   --   --   --  0.60  --  0.61 0.70  LATICACIDVEN  --  3.2* 4.5* 3.7* 2.3*  --  1.1  --   --      Estimated Creatinine Clearance: 83.3 mL/min (by C-G formula based on SCr of 0.7 mg/dL).  Scr 0.68 CrCl 838m/min Allergies  Allergen Reactions   Bee Venom Anaphylaxis   Erythromycin Anaphylaxis   Cephalexin    Neomycin-Bacitracin-Polymyxin  [Bacitracin-Neomycin-Polymyxin] Swelling    Sight of application   Neosporin Original [Bacitracin-Neomycin-Polymyxin] Swelling    Swelling at site of application   Prednisone    Sulfa Antibiotics Swelling    Sulfa eye drops caused eyes to swell Sulfa eye drops caused eyes to swell   Tobrex [Tobramycin] Swelling   Tramadol Hives   Penicillins Rash    Has patient had a PCN reaction causing immediate rash, facial/tongue/throat swelling, SOB or lightheadedness with hypotension: Yes Has patient had a PCN reaction causing severe rash involving mucus membranes or skin necrosis: No Has patient had a PCN reaction that required hospitalization: No Has patient had a PCN reaction occurring within the last 10 years: No If all of the above answers are "NO", then may proceed with Cephalosporin use.     Antimicrobials this  admission: Levaquin 10/11 >>  Flagyl  10/11 >> 10/13  Microbiology results: 10/12 BCX: ngtd 10/11 UCX: > 100K Cfu/ml E. Coli pan sensitive                       > 100K CFU/ml Proteus 10/11 MRSA PCR is negative   Thank you for allowing pharmacy to be a part of this patient's care. LoIsac SarnaBS Pharm D, BCCalifornialinical Pharmacist Pager #[phone]/14/2022 1:26 PM

## 2020-12-15 NOTE — Telephone Encounter (Signed)
Patient will need hospital follow up nonurgent, for liver lesions, h/o colon polyps.

## 2020-12-15 NOTE — Progress Notes (Signed)
PROGRESS NOTE   Sharon Lawrence  PFX:[id number] DOB: 02/06/1966 DOA: 12/12/2020 PCP: Pcp, No   Chief Complaint  Patient presents with   Fever   Level of care: Med-Surg  Brief Admission History:  Sharon Lawrence is a 55 y.o. year old female with history of CAD s/p MI in 2019, IDA, celiac disease, colon polyps, anxiety, ADHD, and chronic pain syndrome who presented to ED due to 3 day onset of fever and ongoing abdominal pain, nausea and vomiting. Pain was not relieved with opoioids at home and eating/drinking worsened her symptoms. She reported compliance with gluten free diet. She denied constipation or diarrhea. Previous admission 5/10-5/13 for intractable nausea and vomiting due to celiac disease, she has been gluten free and on reglan since for gastroparesis. Noted to be septic from UTI.  Sepsis from UTI has been treated aggressively and lactic acid has normalized after IV fluids.  Unfortunately patient now has 3rd spacing of fluid seen mostly in lower extremities and lower abdomen.  With IV lasix and albumin, her edema has improved, her 'bloated' abdominal pain has improved, she reports improved nausea, and looks improved overall.     Assessment & Plan:   Principal Problem:   Sepsis secondary to UTI ([place]) -resolved -wbc initial 18.2 10/11, 10/12 is 11.2, 10/13 8.0, 10/14 7.8 -lactic acid was 4.5, 10/12 1.1, resolved -bicarb was 20, 10/13 22, 10/14 23 -urine was with positive nitrites, large leukocyte, many bacteria upon admission -blood cultures no growth in 2 days   Active Problems:    Abdominal pain, due to hepatomegaly, with nausea and vomiting, and transaminitis -initial ast 65, 10/12 is 43, 10/13  62, 10/14 75    has been markedly elevated previously -alk phos was 237, now 10/12 176,  10/13 163  10/14 188has been markedly elevated previously -GI consulted, per GI: Chronically elevated.  Alkaline phosphatase up to 509 back in May, .  Total bilirubin normal today. Hepatitis B surface  antigen, hepatitis C antibody negative 5 months ago.  Mitochondrial antibodies negative.  CT A/P with contrast showed hepatomegaly and hepatic steatosis with several hyperdense lesions present.  2 of these densities are old, third 1 along posterior aspect of the right lobe is new and will require further evaluation via MRI with and without contrast which can be done as an outpatient.       Thrombocytosis -10/11 561, 10/12 455, 10/13 520, 10/14 544    adequately hydrated, elevated in the past, will continue to monitor and discuss with hematology if needed     Leukocytosis and Lactic acidosis -resolved -secondary to uti and sepsis     Hypoalbuminemia due to protein-calorie malnutrition ([place]) -Gi consulted -Nutrition consulted -low serum protein 4.5, low serum albumin 1.9 10/12 -10/14 albumin 3.1 -continue albumin and lasix     Hyponatremia -Initial 10/11 133, now resolved     Anemia -10/14 hgb 8.1, will continue to trend and intervene accordingly -high saturation 75% -low tibc 187 -higher ferritin 269 -concerning for anemia of chronic disease -denies blood in bowel or urine   Gastroparesis -metoclopramide as needed -nutritional supplements to increase protein intake    DVT prophylaxis: lovenox Code Status: full Family Communication: none at bedside currently Disposition: anticipate home with outpatient GI followup for liver MRI Status is: Inpatient  Remains inpatient appropriate because: she currently is continuing IV antibiotics, albumin, and diuretics.       Consultants:  GI Nutrition  Procedures:  CT abdomen  Antimicrobials:  Levofloxacin 10/11-current Metronidazole 10/11-10/13  Fluconazole 10/12    Subjective: Feels improved, improved edema, improved abd 'bloat' feeling, improved nausea  Objective: Vitals:   12/14/20 1411 12/14/20 2048 12/15/20 0506 12/15/20 1242  BP: 104/68 (!) 103/57 100/73 110/71  Pulse: 99 (!) 102 97 (!) 103  Resp: 19 19 20 18    Temp:  98.4 F (36.9 C) 98.8 F (37.1 C) 98.8 F (37.1 C)  TempSrc:  Oral Oral Oral  SpO2: 96% 99% 100% 100%  Weight:      Height:        Intake/Output Summary (Last 24 hours) at 12/15/2020 1419 Last data filed at 12/15/2020 1300 Gross per 24 hour  Intake 1674.92 ml  Output --  Net 1674.92 ml   Filed Weights   12/12/20 1737 12/12/20 2314  Weight: 82.6 kg 94 kg    Examination:  General exam: Appears tired, calm, less pale than yesterday   Respiratory system: Clear to auscultation. Respiratory effort normal.   Cardiovascular system: normal S1 & S2 heard. No JVD, murmurs, rubs, gallops or clicks. Improved LE edema, Left leg is 2 in the dorsal ped area, less so in the ankle. Right leg, 2-3 pitting in the right foot, 2 in the ankle.   Gastrointestinal system: Abdomen is nondistended, soft and tender in the upper quadrants, also right upper to mid right to some lower right quadrant pain. normal bowel sounds heard.   Central nervous system: Alert and oriented. No focal neurological deficits.   Extremities: moves all extremities appropriately, improved LE edema.   Skin: bruising noted on arms and legs, has not worsened   Psychiatry: Judgement and insight appear normal. Mood & affect appropriate.   Data Reviewed: I have personally reviewed following labs and imaging studies  CBC: Recent Labs  Lab 12/12/20 1538 12/13/20 0435 12/13/20 0708 12/14/20 0443 12/15/20 0612  WBC 18.2* 11.2*  --  8.0 7.8  NEUTROABS 12.7*  --   --  4.8 4.3  HGB 11.2* 8.4* 8.9* 7.9* 8.1*  HCT 35.6* 25.0* 26.8* 24.1* 24.6*  MCV 93.7 89.0  --  89.6 91.8  PLT 561* 455*  --  520* 544*    Basic Metabolic Panel: Recent Labs  Lab 12/12/20 1532 12/13/20 0435 12/14/20 0443 12/15/20 0612  NA 133* 135 137 140  K 3.9 3.1* 4.0 3.6  CL 99 107 107 110  CO2 21* 22 22 23   GLUCOSE 75 85 72 91  BUN 8 7 6  <5*  CREATININE 0.69 0.60 0.61 0.70  CALCIUM 7.8* 6.8* 7.5* 8.0*  MG  --  1.7 2.3 2.0  PHOS   --  2.7  --   --     GFR: Estimated Creatinine Clearance: 83.3 mL/min (by C-G formula based on SCr of 0.7 mg/dL).  Liver Function Tests: Recent Labs  Lab 12/12/20 1532 12/13/20 0435 12/14/20 0443 12/15/20 0612  AST 65* 43* 62* 75*  ALT 34 24 21 21   ALKPHOS 237* 176* 163* 188*  BILITOT 1.3* 1.0 1.2 0.5  PROT 6.2* 4.5* 5.1* 5.2*  ALBUMIN 2.6* 1.9* 2.8* 3.1*    CBG: No results for input(s): GLUCAP in the last 168 hours.  Recent Results (from the past 240 hour(s))  Urine Culture     Status: Abnormal (Preliminary result)   Collection Time: 12/12/20  4:58 PM   Specimen: Urine, Clean Catch  Result Value Ref Range Status   Specimen Description   Final    URINE, CLEAN CATCH Performed at [facility], [address] [place], [place] [address]  Special Requests   Final    NONE Performed at [facility], [address], [place], [place] [address]    Culture (A)  Final    >=100,000 COLONIES/mL ESCHERICHIA COLI >=100,000 COLONIES/mL PROTEUS MIRABILIS SUSCEPTIBILITIES TO FOLLOW Performed at [facility] [address], [place], [place] [address]    Report Status PENDING  Incomplete   Organism ID, Bacteria ESCHERICHIA COLI (A)  Final      Susceptibility   Escherichia coli - MIC*    AMPICILLIN <=2 SENSITIVE Sensitive     CEFAZOLIN <=4 SENSITIVE Sensitive     CEFEPIME <=0.12 SENSITIVE Sensitive     CEFTRIAXONE <=0.25 SENSITIVE Sensitive     CIPROFLOXACIN <=0.25 SENSITIVE Sensitive     GENTAMICIN <=1 SENSITIVE Sensitive     IMIPENEM <=0.25 SENSITIVE Sensitive     NITROFURANTOIN <=16 SENSITIVE Sensitive     TRIMETH/SULFA <=20 SENSITIVE Sensitive     AMPICILLIN/SULBACTAM <=2 SENSITIVE Sensitive     PIP/TAZO <=4 SENSITIVE Sensitive     * >=100,000 COLONIES/mL ESCHERICHIA COLI  Resp Panel by RT-PCR (Flu A&B, Covid)     Status: None   Collection Time: 12/12/20  8:05 PM  Result Value Ref Range Status   SARS Coronavirus 2 by RT PCR NEGATIVE NEGATIVE Final    Comment:  (NOTE) SARS-CoV-2 target nucleic acids are NOT DETECTED.  The SARS-CoV-2 RNA is generally detectable in upper respiratory specimens during the acute phase of infection. The lowest concentration of SARS-CoV-2 viral copies this assay can detect is 138 copies/mL. A negative result does not preclude SARS-Cov-2 infection and should not be used as the sole basis for treatment or other patient management decisions. A negative result may occur with  improper specimen collection/handling, submission of specimen other than nasopharyngeal swab, presence of viral mutation(s) within the areas targeted by this assay, and inadequate number of viral copies(<138 copies/mL). A negative result must be combined with clinical observations, patient history, and epidemiological information. The expected result is Negative.  Fact Sheet for Patients:  EntrepreneurPulse.com.au  Fact Sheet for Healthcare Providers:  IncredibleEmployment.be  This test is no t yet approved or cleared by the [place] FDA and  has been authorized for detection and/or diagnosis of SARS-CoV-2 by FDA under an Emergency Use Authorization (EUA). This EUA will remain  in effect (meaning this test can be used) for the duration of the COVID-19 declaration under Section 564(b)(1) of the Act, 21 U.S.C.section 360bbb-3(b)(1), unless the authorization is terminated  or revoked sooner.       Influenza A by PCR NEGATIVE NEGATIVE Final   Influenza B by PCR NEGATIVE NEGATIVE Final    Comment: (NOTE) The Xpert Xpress SARS-CoV-2/FLU/RSV plus assay is intended as an aid in the diagnosis of influenza from Nasopharyngeal swab specimens and should not be used as a sole basis for treatment. Nasal washings and aspirates are unacceptable for Xpert Xpress SARS-CoV-2/FLU/RSV testing.  Fact Sheet for Patients: EntrepreneurPulse.com.au  Fact Sheet for Healthcare  Providers: IncredibleEmployment.be  This test is not yet approved or cleared by the [place] FDA and has been authorized for detection and/or diagnosis of SARS-CoV-2 by FDA under an Emergency Use Authorization (EUA). This EUA will remain in effect (meaning this test can be used) for the duration of the COVID-19 declaration under Section 564(b)(1) of the Act, 21 U.S.C. section 360bbb-3(b)(1), unless the authorization is terminated or revoked.  Performed at [facility], [address] [place], [place] [address]   MRSA Next Gen by PCR, Nasal  Status: None   Collection Time: 12/12/20 10:51 PM   Specimen: Nasal Mucosa; Nasal Swab  Result Value Ref Range Status   MRSA by PCR Next Gen NOT DETECTED NOT DETECTED Final    Comment: (NOTE) The GeneXpert MRSA Assay (FDA approved for NASAL specimens only), is one component of a comprehensive MRSA colonization surveillance program. It is not intended to diagnose MRSA infection nor to guide or monitor treatment for MRSA infections. Test performance is not FDA approved in patients less than 29 years old. Performed at [facility], [address], [place], [place] [address]   Culture, blood (Routine X 2) w Reflex to ID Panel     Status: None (Preliminary result)   Collection Time: 12/13/20  1:35 AM   Specimen: BLOOD  Result Value Ref Range Status   Specimen Description BLOOD BLOOD RIGHT HAND  Final   Special Requests   Final    BOTTLES DRAWN AEROBIC AND ANAEROBIC Blood Culture adequate volume   Culture   Final    NO GROWTH 2 DAYS Performed at [facility], [address] [place], [place] [address]    Report Status PENDING  Incomplete  Culture, blood (Routine X 2) w Reflex to ID Panel     Status: None (Preliminary result)   Collection Time: 12/13/20  1:35 AM   Specimen: BLOOD  Result Value Ref Range Status   Specimen Description BLOOD BLOOD LEFT HAND  Final   Special Requests   Final    BOTTLES DRAWN AEROBIC AND  ANAEROBIC Blood Culture adequate volume   Culture   Final    NO GROWTH 2 DAYS Performed at [facility], [address], [place], [place] [address]    Report Status PENDING  Incomplete     Radiology Studies: No results found.  Scheduled Meds:  Chlorhexidine Gluconate Cloth  6 each Topical Daily   enoxaparin (LOVENOX) injection  40 mg Subcutaneous Q24H   famotidine  20 mg Oral QHS   feeding supplement  1 Container Oral TID BM   furosemide  40 mg Intravenous Daily   ketoconazole   Topical BID   multivitamin with minerals  1 tablet Oral Daily   pantoprazole  40 mg Oral Daily   Continuous Infusions:  albumin human 50 g (12/15/20 0905)   lactated ringers     And   lactated ringers     And   lactated ringers     lactated ringers 10 mL/hr at 12/15/20 1022   levofloxacin (LEVAQUIN) IV 750 mg (12/14/20 1715)     LOS: 3 days    Sharon Lawrence, Student How to contact the [facility] Attending or Consulting provider [place] or covering provider during after hours [place], for this patient?  Check the care team in [facility] and look for a) attending/consulting [facility] provider listed and b) the [facility] team listed Log into [email or web address] and use [place]'s universal password to access. If you do not have the password, please contact the hospital operator. Locate the [facility] provider you are looking for under [facility] and page to a number that you can be directly reached. If you still have difficulty reaching the provider, please page the [facility] (Director on Call) for the Hospitalists listed on amion for assistance.  12/15/2020, 2:19 PM

## 2020-12-16 DIAGNOSIS — K900 Celiac disease: Secondary | ICD-10-CM

## 2020-12-16 DIAGNOSIS — K3184 Gastroparesis: Secondary | ICD-10-CM

## 2020-12-16 LAB — URINE CULTURE: Culture: >=100000 — AB

## 2020-12-16 LAB — BASIC METABOLIC PANEL
Anion gap: 6 (ref 5–15)
BUN: 5 mg/dL — ABNORMAL LOW (ref 6–20)
CO2: 26 mmol/L (ref 22–32)
Calcium: 8.2 mg/dL — ABNORMAL LOW (ref 8.9–10.3)
Chloride: 109 mmol/L (ref 98–111)
Creatinine, Ser: 0.58 mg/dL (ref 0.44–1.00)
GFR, Estimated: >60 mL/min (ref >60)
Glucose, Bld: 86 mg/dL (ref 70–99)
Potassium: 3.9 mmol/L (ref 3.5–5.1)
Sodium: 141 mmol/L (ref 135–145)

## 2020-12-16 LAB — ALBUMIN: Albumin: 3.5 g/dL (ref 3.5–5.0)

## 2020-12-16 LAB — TISSUE TRANSGLUTAMINASE, IGA: Tissue Transglutaminase Ab, IgA: >100 U/mL — ABNORMAL HIGH (ref 0–3)

## 2020-12-16 MED ORDER — TORSEMIDE 20 MG PO TABS: 20.0000 mg | ORAL_TABLET | Freq: Every day | ORAL | 2 refills | Status: DC | PRN

## 2020-12-16 MED ORDER — PREGABALIN 75 MG PO CAPS
150.0000 mg | ORAL_CAPSULE | Freq: Two times a day (BID) | ORAL | Status: DC
  Administered 2020-12-16: 150 mg via ORAL
  Filled 2020-12-16: qty 2

## 2020-12-16 MED ORDER — NYSTATIN 100000 UNIT/GM EX POWD: Freq: Three times a day (TID) | CUTANEOUS | 1 refills | Status: AC | PRN

## 2020-12-16 MED ORDER — LEVOFLOXACIN 500 MG PO TABS: 500.0000 mg | ORAL_TABLET | Freq: Every day | ORAL | 0 refills | Status: DC

## 2020-12-16 MED ORDER — LEVOFLOXACIN 500 MG PO TABS: 500.0000 mg | ORAL_TABLET | Freq: Every day | ORAL | Status: DC

## 2020-12-16 MED ORDER — IBUPROFEN 800 MG PO TABS
800.0000 mg | ORAL_TABLET | Freq: Three times a day (TID) | ORAL | 0 refills | Status: DC | PRN
Start: 2020-12-16 — End: 2021-01-11

## 2020-12-16 MED ORDER — LEVOFLOXACIN 500 MG PO TABS: 500.0000 mg | ORAL_TABLET | Freq: Every day | ORAL | 0 refills | Status: AC

## 2020-12-16 MED ORDER — METOCLOPRAMIDE HCL 10 MG PO TABS: 10.0000 mg | ORAL_TABLET | Freq: Three times a day (TID) | ORAL | 2 refills | Status: AC | PRN

## 2020-12-16 MED ORDER — KETOCONAZOLE 2 % EX CREA: TOPICAL_CREAM | Freq: Two times a day (BID) | CUTANEOUS | 1 refills | Status: AC

## 2020-12-16 MED ORDER — KETOCONAZOLE 2 % EX CREA: TOPICAL_CREAM | Freq: Two times a day (BID) | CUTANEOUS | 1 refills | Status: DC

## 2020-12-16 NOTE — Progress Notes (Signed)
Sharon Lawrence, M.D. Gastroenterology & Hepatology   Interval History:  Patient reports that she has been able to tolerate more her food and did not have any more vomiting episodes since yesterday.  States that she is still having pain in her upper abdomen but this is better compared to prior. Albumin today increased to 3.5.  He still has significant lower extremity edema.  Inpatient Medications:  Current Facility-Administered Medications:    acetaminophen (TYLENOL) tablet 650 mg, 650 mg, Oral, Q6H PRN **OR** acetaminophen (TYLENOL) suppository 650 mg, 650 mg, Rectal, Q6H PRN, Adefeso, Oladapo, DO   Chlorhexidine Gluconate Cloth 2 % PADS 6 each, 6 each, Topical, Daily, Adefeso, Oladapo, DO, 6 each at 12/16/20 0930   enoxaparin (LOVENOX) injection 40 mg, 40 mg, Subcutaneous, Q24H, Adefeso, Oladapo, DO, 40 mg at 12/15/20 2205   famotidine (PEPCID) tablet 20 mg, 20 mg, Oral, QHS, Adefeso, Oladapo, DO, 20 mg at 12/15/20 2204   feeding supplement (BOOST / RESOURCE BREEZE) liquid 1 Container, 1 Container, Oral, TID BM, Johnson, Clanford L, MD   furosemide (LASIX) injection 40 mg, 40 mg, Intravenous, Daily, Johnson, Clanford L, MD, 40 mg at 12/16/20 3299   ketoconazole (NIZORAL) 2 % cream, , Topical, BID, Johnson, Clanford L, MD, Given at 12/16/20 0930   lactated ringers bolus 1,000 mL, 1,000 mL, Intravenous, Once **AND** lactated ringers bolus 1,000 mL, 1,000 mL, Intravenous, Once **AND** lactated ringers bolus 1,000 mL, 1,000 mL, Intravenous, Once, Adefeso, Oladapo, DO   lactated ringers infusion, , Intravenous, Continuous, Johnson, Clanford L, MD, Last Rate: 10 mL/hr at 12/15/20 1022, Rate Change at 12/15/20 1022   levofloxacin (LEVAQUIN) tablet 500 mg, 500 mg, Oral, q1800, Johnson, Clanford L, MD   metoCLOPramide (REGLAN) injection 10 mg, 10 mg, Intravenous, Q6H PRN, Adefeso, Oladapo, DO   multivitamin with minerals tablet 1 tablet, 1 tablet, Oral, Daily, Johnson, Clanford L, MD, 1 tablet at  12/16/20 0930   [DISCONTINUED] ondansetron (ZOFRAN) tablet 4 mg, 4 mg, Oral, Q6H PRN **OR** ondansetron (ZOFRAN) injection 4 mg, 4 mg, Intravenous, Q6H PRN, Adefeso, Oladapo, DO, 4 mg at 12/15/20 1839   oxyCODONE (Oxy IR/ROXICODONE) immediate release tablet 5 mg, 5 mg, Oral, Q8H PRN, Adefeso, Oladapo, DO, 5 mg at 12/16/20 0624   pantoprazole (PROTONIX) EC tablet 40 mg, 40 mg, Oral, Daily, Adefeso, Oladapo, DO, 40 mg at 12/16/20 0929   pregabalin (LYRICA) capsule 150 mg, 150 mg, Oral, BID, Wynetta Emery, Clanford L, MD   I/O    Intake/Output Summary (Last 24 hours) at 12/16/2020 1244 Last data filed at 12/16/2020 0900 Gross per 24 hour  Intake 1196.33 ml  Output --  Net 1196.33 ml     Physical Exam: Temp:  [98.2 F (36.8 C)] 98.2 F (36.8 C) (10/15 0420) Pulse Rate:  [95-100] 100 (10/15 0420) Resp:  [20] 20 (10/15 0420) BP: (85-98)/(62-70) 98/70 (10/15 0420) SpO2:  [100 %] 100 % (10/15 0420) Weight:  [92.9 kg-93.2 kg] 93.2 kg (10/15 0500)  Temp (24hrs), Avg:98.2 F (36.8 C), Min:98.2 F (36.8 C), Max:98.2 F (36.8 C) GENERAL: The patient is AO x3, in no acute distress. HEENT: Head is normocephalic and atraumatic. EOMI are intact. Mouth is well hydrated and without lesions. NECK: Supple. No masses LUNGS: Clear to auscultation. No presence of rhonchi/wheezing/rales. Adequate chest expansion HEART: RRR, normal s1 and s2. ABDOMEN: tender upon palpation in the upper abdomen diffusely, no guarding, no peritoneal signs, and nondistended. BS +. No masses. EXTREMITIES: Without any cyanosis, clubbing, rash, lesions or edema. NEUROLOGIC: AOx3, no focal  motor deficit. SKIN: no jaundice, no rashes  Laboratory Data: CBC:     Component Value Date/Time   WBC 7.8 12/15/2020 0612   RBC 2.68 (L) 12/15/2020 0612   HGB 8.1 (L) 12/15/2020 0612   HCT 24.6 (L) 12/15/2020 0612   PLT 544 (H) 12/15/2020 0612   MCV 91.8 12/15/2020 0612   MCH 30.2 12/15/2020 0612   MCHC 32.9 12/15/2020 0612   RDW 25.0  (H) 12/15/2020 0612   LYMPHSABS 2.6 12/15/2020 0612   MONOABS 0.7 12/15/2020 0612   EOSABS 0.1 12/15/2020 0612   BASOSABS 0.0 12/15/2020 0612   COAG:  Lab Results  Component Value Date   INR 1.2 12/13/2020   INR 1.0 07/11/2020   INR 1.14 09/25/2017    BMP:  BMP Latest Ref Rng & Units 12/16/2020 12/15/2020 12/14/2020  Glucose 70 - 99 mg/dL 86 91 72  BUN 6 - 20 mg/dL 5(L) <5(L) 6  Creatinine 0.44 - 1.00 mg/dL 0.58 0.70 0.61  BUN/Creat Ratio 6 - 22 (calc) - - -  Sodium 135 - 145 mmol/L 141 140 137  Potassium 3.5 - 5.1 mmol/L 3.9 3.6 4.0  Chloride 98 - 111 mmol/L 109 110 107  CO2 22 - 32 mmol/L _0 Calcium 8.9 - 10.3 mg/dL 8.2(L) 8.0(L) 7.5(L)    HEPATIC:  Hepatic Function Latest Ref Rng & Units 12/16/2020 12/15/2020 12/14/2020  Total Protein 6.5 - 8.1 g/dL - 5.2(L) 5.1(L)  Albumin 3.5 - 5.0 g/dL 3.5 3.1(L) 2.8(L)  AST 15 - 41 U/L - 75(H) 62(H)  ALT 0 - 44 U/L - 21 21  Alk Phosphatase 38 - 126 U/L - 188(H) 163(H)  Total Bilirubin 0.3 - 1.2 mg/dL - 0.5 1.2  Bilirubin, Direct 0.0 - 0.2 mg/dL - - -    CARDIAC:  Lab Results  Component Value Date   TROPONINI <0.03 12/31/2017      Imaging: I personally reviewed and interpreted the available labs, imaging and endoscopic files.   Assessment/Plan: Sharon Lawrence is a 55 y.o. year old female with history of CAD s/p MI in 2019, IDA, celiac disease, gastroparesis, colon polyps, anxiety, ADHD, and chronic pain syndrome who presented to ED with abdominal pain, nausea and vomiting.  The patient was admitted to the hospital back in May 2022 for similar symptoms which was considered to be secondary to active celiac disease for which she has been trying to be compliant with a gluten-free diet and taking Reglan for gastroparesis.   During this admission the patient was noted to have significant lower extremity edema which was considered to be secondary to hypoalbuminemia which has improved with IV albumin and diuresis, however she is still  presenting significant third spacing.  This is likely a result of malnutrition due to her disease.  We need to make sure that she is not presenting persistent celiac disease activity due to cross-contamination in her diet.  Nutrition consult will be important to make sure she is compliant to this.  We are waiting to have the results of her TTG IgA.  If after avoiding exposure she still has active disease we will consider other secondary treatments such as open capsule budesonide.  Also, as she would need to have a repeat therapeutic colonoscopy, we will need to set up a repeat EGD with repeat duodenal biopsies specifying that the pathologist needs to evaluate for T-cell lymphoproliferation.  Will recommend continuing IV Lasix and albumin if the albumin is less than 3.5.  We will need to  take metoclopramide as needed for her gastroparesis.  - Gluten free diet and nutrition consult - C/w IV lasix, albumin IV if <3.5  - Reglan as needed for vomiting/nausea - f/u TTG IgA - Will need outpatient EGD (duodenal biopsies with rec to check for T cell clonal activity) and colonoscopy - Outpatient liver MRI for liver lesions  Sharon Peppers, MD Gastroenterology and Hepatology [facility]

## 2020-12-16 NOTE — Plan of Care (Signed)

## 2020-12-16 NOTE — Discharge Summary (Addendum)
Physician Discharge Summary  TASHENA IBACH HCW:[id number] DOB: December 01, 1965 DOA: 12/12/2020  Admit date: 12/12/2020 Discharge date: 12/16/2020  Admitted From:  Home  Disposition: Home   Recommendations for Outpatient Follow-up:  Follow up with PCP in 1-2 weeks Follow up with GI for MRI liver as scheduled by GI already Please obtain BMP in 1-2 weeks to follow up lytes  Discharge Condition: STABLE   CODE STATUS: FULL DIET: GLUTEN FREE    Brief Hospitalization Summary: Please see all hospital notes, images, labs for full details of the hospitalization. ADMISSION HPI: Sharon Lawrence is a 55 y.o. female with medical history significant for  coronary artery disease status post MI in 2019, iron deficiency anemia, celiac disease, anxiety, ADHD and chronic pain syndrome who presents to the ED due to 3 day onset of fever. She complained of  3 month history abdominal pain, nausea and vomiting.  Abdominal pain was constant, sharp and crampy in nature.  Pain was not relieved with opioids and eating and drinking worsens vomiting, patient states she has been compliant with gluten-free diet.  She denies chest pain, shortness of breath, headache, constipation or diarrhea Patient was admitted from 5/10-5/13 due to intractable nausea and vomiting due to celiac disease, during which she was started on gluten-free diet and metoclopramide for a component of gastroparesis   ED Course: In the emergency department, patient was tachycardic, BP was 123/86 and other vital signs were within normal range.  Work-up in the ED showed leukocytosis, thrombocytosis, hyponatremia, lactic acidosis, hypoalbuminemia, transaminitis.  Urinalysis was positive for large leukocytes and nitrite as well as many bacteria.  Lipase level was normal.  Influenza A, B, SARS coronavirus was negative. CT abdomen pelvis with contrast showed hepatomegaly and hepatic steatosis with several hypodense lesions Patient was treated with Levaquin, IV hydration was  provided.  Ibuprofen was given.  Hospitalist was asked to admit patient for further evaluation and management.  HOSPITAL COURSE BY PROBLEM LIST   sepsis secondary to UTI  -resolved -wbc initial 18.2 10/11, 10/12 is 11.2, 10/13 8.0, 10/14 7.8 -lactic acid was 4.5, 10/12 1.1, resolved -bicarb was 20, 10/13 22, 10/14 23 -urine was with positive nitrites, large leukocyte, many bacteria upon admission -blood cultures no growth to date  - DC home on 3 more days of levofloxacin    Abdominal pain, due to hepatomegaly, with nausea and vomiting, and transaminitis -initial ast 65, 10/12 is 43, 10/13  62, 10/14 75    has been markedly elevated previously -alk phos was 237, now 10/12 176,  10/13 163  10/14 188has been markedly elevated previously -GI consulted, per GI: Chronically elevated.  Alkaline phosphatase up to 509 back in May, .  Total bilirubin normal today. Hepatitis B surface antigen, hepatitis C antibody negative 5 months ago.  Mitochondrial antibodies negative.  CT A/P with contrast showed hepatomegaly and hepatic steatosis with several hyperdense lesions present.  2 of these densities are old, third 1 along posterior aspect of the right lobe is new and will require further evaluation via MRI with and without contrast which can be done as an outpatient.  GI made arrangements for outpatient GI follow up and outpatient MRI Liver.      Thrombocytosis - stable -10/11 561, 10/12 455, 10/13 520, 10/14 544    adequately hydrated, elevated in the past, will continue to monitor and discuss with hematology if needed     Leukocytosis and Lactic acidosis -resolved -secondary to uti and sepsis     Hypoalbuminemia due  to protein-calorie malnutrition ([place]) -Gi consulted -Nutrition consulted -low serum protein 4.5, low serum albumin 1.9 10/12 -10/14 albumin 3.1 -ALBUMIN 3.5 TODAY within normal limits     Hyponatremia - RESOLVED  -Initial 10/11 133, now resolved     Anemia of chronic disease  -10/14  hgb 8.1, will continue to trend and intervene accordingly -high saturation 75% -low tibc 187 -higher ferritin 269 -concerning for anemia of chronic disease -denies blood in bowel or urine   Gastroparesis -metoclopramide as needed -nutritional supplements to increase protein intake     DVT prophylaxis: lovenox Code Status: full Family Communication: none at bedside  Disposition: home with outpatient GI followup for liver MRI Status is: Inpatient Discharge Diagnoses:  Principal Problem:   Sepsis due to urinary tract infection ([place]) Active Problems:   Fatty liver disease, nonalcoholic   Thrombocytosis   Nausea & vomiting   Transaminitis   Leukocytosis   Abdominal pain   Lactic acidosis   Hypoalbuminemia due to protein-calorie malnutrition ([facility])   Hyponatremia   Discharge Instructions:  Allergies as of 12/16/2020       Reactions   Bee Venom Anaphylaxis   Erythromycin Anaphylaxis   Cephalexin    Neomycin-bacitracin-polymyxin  [bacitracin-neomycin-polymyxin] Swelling   Sight of application   Neosporin Original [bacitracin-neomycin-polymyxin] Swelling   Swelling at site of application   Prednisone    Sulfa Antibiotics Swelling   Sulfa eye drops caused eyes to swell Sulfa eye drops caused eyes to swell   Tobrex [tobramycin] Swelling   Tramadol Hives   Penicillins Rash   Has patient had a PCN reaction causing immediate rash, facial/tongue/throat swelling, SOB or lightheadedness with hypotension: Yes Has patient had a PCN reaction causing severe rash involving mucus membranes or skin necrosis: No Has patient had a PCN reaction that required hospitalization: No Has patient had a PCN reaction occurring within the last 10 years: No If all of the above answers are "NO", then may proceed with Cephalosporin use.        Medication List     STOP taking these medications    baclofen 20 MG tablet Commonly known as: LIORESAL   clotrimazole 10 MG troche Commonly known  as: MYCELEX   diazepam 10 MG tablet Commonly known as: VALIUM   fluconazole 150 MG tablet Commonly known as: DIFLUCAN   fluticasone 50 MCG/ACT nasal spray Commonly known as: FLONASE   VITAMIN B-12 IJ       TAKE these medications    aspirin 81 MG chewable tablet Chew 1 tablet (81 mg total) by mouth daily.   famotidine 40 MG tablet Commonly known as: PEPCID Take 1 tablet (40 mg total) by mouth at bedtime.   ibuprofen 800 MG tablet Commonly known as: ADVIL Take 1 tablet (800 mg total) by mouth every 8 (eight) hours as needed for mild pain, moderate pain, fever, cramping or headache. What changed:  when to take this reasons to take this   ketoconazole 2 % cream Commonly known as: NIZORAL Apply topically 2 (two) times daily.   levofloxacin 500 MG tablet Commonly known as: LEVAQUIN Take 1 tablet (500 mg total) by mouth daily at 6 PM for 3 days.   levonorgestrel 20 MCG/24HR IUD Commonly known as: MIRENA by Intrauterine route.   loperamide 2 MG capsule Commonly known as: IMODIUM Take 1 capsule (2 mg total) by mouth 3 (three) times daily as needed for diarrhea or loose stools.   metoCLOPramide 10 MG tablet Commonly known as: REGLAN Take 1 tablet (10  mg total) by mouth every 8 (eight) hours as needed for nausea. What changed:  when to take this reasons to take this   nitroGLYCERIN 0.4 MG/SPRAY spray Commonly known as: NITROLINGUAL Place 2 sprays under the tongue every 5 (five) minutes x 3 doses as needed.   nystatin powder Commonly known as: MYCOSTATIN/NYSTOP Apply topically 3 (three) times daily as needed (yeast infection). What changed:  when to take this reasons to take this   omeprazole 40 MG capsule Commonly known as: PRILOSEC Take 1 capsule (40 mg total) by mouth daily.   ondansetron 8 MG disintegrating tablet Commonly known as: ZOFRAN-ODT Take 1 tablet (8 mg total) by mouth 3 (three) times daily as needed for nausea.   oxyCODONE 5 MG immediate  release tablet Commonly known as: Oxy IR/ROXICODONE Take 1 tablet (5 mg total) by mouth every 8 (eight) hours as needed for severe pain. Must last 30 days.   oxyCODONE 5 MG immediate release tablet Commonly known as: Oxy IR/ROXICODONE Take 1 tablet (5 mg total) by mouth every 8 (eight) hours as needed for severe pain. Must last 30 days. Start taking on: December 17, 2020   pregabalin 150 MG capsule Commonly known as: LYRICA Take 150 mg by mouth 2 (two) times daily.   prochlorperazine 10 MG tablet Commonly known as: COMPAZINE TAKE (1) TABLET BY MOUTH EVERY SIX HOURS AS NEEDED NAUSEA OR VOMITING. What changed: See the new instructions.   rizatriptan 10 MG tablet Commonly known as: MAXALT Take 10 mg by mouth as needed.   torsemide 20 MG tablet Commonly known as: DEMADEX Take 1 tablet (20 mg total) by mouth daily as needed (edema). As needed. What changed:  how to take this when to take this reasons to take this   Vitamin D (Ergocalciferol) 1.25 MG (50000 UNIT) Caps capsule Commonly known as: DRISDOL Take 50,000 Units by mouth every 7 (seven) days. Wednesday        Follow-up Information     Branch, Alphonse Guild, MD. Schedule an appointment as soon as possible for a visit in 2 week(s).   Specialty: Cardiology Why: Hospital Follow Up Contact information: [place] [address] [phone]         [place]. Schedule an appointment as soon as possible for a visit in 1 month(s).   Specialty: Gastroenterology Why: Hospital Follow Up Contact information: [address] [place] [address] [phone]               Allergies  Allergen Reactions   Bee Venom Anaphylaxis   Erythromycin Anaphylaxis   Cephalexin    Neomycin-Bacitracin-Polymyxin  [Bacitracin-Neomycin-Polymyxin] Swelling    Sight of application   Neosporin Original [Bacitracin-Neomycin-Polymyxin] Swelling    Swelling at site  of application   Prednisone    Sulfa Antibiotics Swelling    Sulfa eye drops caused eyes to swell Sulfa eye drops caused eyes to swell   Tobrex [Tobramycin] Swelling   Tramadol Hives   Penicillins Rash    Has patient had a PCN reaction causing immediate rash, facial/tongue/throat swelling, SOB or lightheadedness with hypotension: Yes Has patient had a PCN reaction causing severe rash involving mucus membranes or skin necrosis: No Has patient had a PCN reaction that required hospitalization: No Has patient had a PCN reaction occurring within the last 10 years: No If all of the above answers are "NO", then may proceed with Cephalosporin use.    Allergies as of 12/16/2020  Reactions   Bee Venom Anaphylaxis   Erythromycin Anaphylaxis   Cephalexin    Neomycin-bacitracin-polymyxin  [bacitracin-neomycin-polymyxin] Swelling   Sight of application   Neosporin Original [bacitracin-neomycin-polymyxin] Swelling   Swelling at site of application   Prednisone    Sulfa Antibiotics Swelling   Sulfa eye drops caused eyes to swell Sulfa eye drops caused eyes to swell   Tobrex [tobramycin] Swelling   Tramadol Hives   Penicillins Rash   Has patient had a PCN reaction causing immediate rash, facial/tongue/throat swelling, SOB or lightheadedness with hypotension: Yes Has patient had a PCN reaction causing severe rash involving mucus membranes or skin necrosis: No Has patient had a PCN reaction that required hospitalization: No Has patient had a PCN reaction occurring within the last 10 years: No If all of the above answers are "NO", then may proceed with Cephalosporin use.        Medication List     STOP taking these medications    baclofen 20 MG tablet Commonly known as: LIORESAL   clotrimazole 10 MG troche Commonly known as: MYCELEX   diazepam 10 MG tablet Commonly known as: VALIUM   fluconazole 150 MG tablet Commonly known as: DIFLUCAN   fluticasone 50 MCG/ACT nasal  spray Commonly known as: FLONASE   VITAMIN B-12 IJ       TAKE these medications    aspirin 81 MG chewable tablet Chew 1 tablet (81 mg total) by mouth daily.   famotidine 40 MG tablet Commonly known as: PEPCID Take 1 tablet (40 mg total) by mouth at bedtime.   ibuprofen 800 MG tablet Commonly known as: ADVIL Take 1 tablet (800 mg total) by mouth every 8 (eight) hours as needed for mild pain, moderate pain, fever, cramping or headache. What changed:  when to take this reasons to take this   ketoconazole 2 % cream Commonly known as: NIZORAL Apply topically 2 (two) times daily.   levofloxacin 500 MG tablet Commonly known as: LEVAQUIN Take 1 tablet (500 mg total) by mouth daily at 6 PM for 3 days.   levonorgestrel 20 MCG/24HR IUD Commonly known as: MIRENA by Intrauterine route.   loperamide 2 MG capsule Commonly known as: IMODIUM Take 1 capsule (2 mg total) by mouth 3 (three) times daily as needed for diarrhea or loose stools.   metoCLOPramide 10 MG tablet Commonly known as: REGLAN Take 1 tablet (10 mg total) by mouth every 8 (eight) hours as needed for nausea. What changed:  when to take this reasons to take this   nitroGLYCERIN 0.4 MG/SPRAY spray Commonly known as: NITROLINGUAL Place 2 sprays under the tongue every 5 (five) minutes x 3 doses as needed.   nystatin powder Commonly known as: MYCOSTATIN/NYSTOP Apply topically 3 (three) times daily as needed (yeast infection). What changed:  when to take this reasons to take this   omeprazole 40 MG capsule Commonly known as: PRILOSEC Take 1 capsule (40 mg total) by mouth daily.   ondansetron 8 MG disintegrating tablet Commonly known as: ZOFRAN-ODT Take 1 tablet (8 mg total) by mouth 3 (three) times daily as needed for nausea.   oxyCODONE 5 MG immediate release tablet Commonly known as: Oxy IR/ROXICODONE Take 1 tablet (5 mg total) by mouth every 8 (eight) hours as needed for severe pain. Must last 30 days.    oxyCODONE 5 MG immediate release tablet Commonly known as: Oxy IR/ROXICODONE Take 1 tablet (5 mg total) by mouth every 8 (eight) hours as needed for severe pain. Must last  30 days. Start taking on: December 17, 2020   pregabalin 150 MG capsule Commonly known as: LYRICA Take 150 mg by mouth 2 (two) times daily.   prochlorperazine 10 MG tablet Commonly known as: COMPAZINE TAKE (1) TABLET BY MOUTH EVERY SIX HOURS AS NEEDED NAUSEA OR VOMITING. What changed: See the new instructions.   rizatriptan 10 MG tablet Commonly known as: MAXALT Take 10 mg by mouth as needed.   torsemide 20 MG tablet Commonly known as: DEMADEX Take 1 tablet (20 mg total) by mouth daily as needed (edema). As needed. What changed:  how to take this when to take this reasons to take this   Vitamin D (Ergocalciferol) 1.25 MG (50000 UNIT) Caps capsule Commonly known as: DRISDOL Take 50,000 Units by mouth every 7 (seven) days. Wednesday        Procedures/Studies: CT ABDOMEN PELVIS W CONTRAST  Result Date: 12/12/2020 CLINICAL DATA:  Abdominal pain, acute, nonlocalized. Pt c/o abdominal swelling x 3 months EXAM: CT ABDOMEN AND PELVIS WITH CONTRAST TECHNIQUE: Multidetector CT imaging of the abdomen and pelvis was performed using the standard protocol following bolus administration of intravenous contrast. CONTRAST:  164m OMNIPAQUE IOHEXOL 300 MG/ML  SOLN COMPARISON:  CT abdomen pelvis 06/25/2020, CT abdomen pelvis 06/10/2012 FINDINGS: Lower chest: Left lower lobe linear atelectasis. Hepatobiliary: The liver is enlarged measuring up to 23cm. vague 0.9 cm right posterior hepatic lobe hyperdensity. Similar findings within the lateral abdomen urine left hepatic lobe (2:28) measuring up to 2.5 cm. The hepatic parenchyma is diffusely hypodense compared to the splenic parenchyma consistent with fatty infiltration. No focal liver abnormality. Status post cholecystectomy. No biliary dilatation. Pancreas: No focal lesion.  Normal pancreatic contour. No surrounding inflammatory changes. No main pancreatic ductal dilatation. Spleen: Normal in size without focal abnormality. Adrenals/Urinary Tract: Bilateral and renal nodularity appears similar to prior measuring up to 2 cm bilaterally with associated density of 40-50 Hounsfield units. Stomach/Bowel: Stomach is within normal limits. No evidence of bowel wall thickening or dilatation. Appendix appears normal. Vascular/Lymphatic: No abdominal aorta or iliac aneurysm. Mild atherosclerotic plaque of the aorta and its branches. No abdominal, pelvic, or inguinal lymphadenopathy. Reproductive: T-shaped intrauterine device noted within the uterus in grossly appropriate position within the endometrial canal. Uterus and bilateral adnexa are unremarkable. Other: Trace volume simple free fluid within the abdomen or pelvis. No intraperitoneal free gas. No organized fluid collection. Musculoskeletal: Subcutaneus soft tissue edema. No suspicious lytic or blastic osseous lesions. No acute displaced fracture. Multilevel degenerative changes of the spine. IMPRESSION: 1. Hepatomegaly and hepatic steatosis with several hyperdense lesions. Recommend nonemergent MRI liver protocol further evaluation. When the patient is clinically stable and able to follow directions and hold their breath (preferably as an outpatient) further evaluation with dedicated abdominal MRI should be considered. 2. Interval development of trace simple free fluid ascites. 3. T-shaped intrauterine device in grossly appropriate position. Electronically Signed   By: MIven FinnM.D.   On: 12/12/2020 18:16     Subjective: Pt reports feeling much better, edema is legs is much better.    Discharge Exam: Vitals:   12/15/20 2043 12/16/20 0420  BP: (!) 85/62 98/70  Pulse: 95 100  Resp: 20 20  Temp: 98.2 F (36.8 C) 98.2 F (36.8 C)  SpO2: 100% 100%   Vitals:   12/15/20 1500 12/15/20 2043 12/16/20 0420 12/16/20 0500  BP:   (!) 85/62 98/70   Pulse:  95 100   Resp:  20 20   Temp:  98.2 F (36.8  C) 98.2 F (36.8 C)   TempSrc:   Axillary   SpO2:  100% 100%   Weight: 92.9 kg   93.2 kg  Height: 5' (1.524 m)      General: Pt is alert, awake, not in acute distress Cardiovascular: normal S1/S2 +, no rubs, no gallops Respiratory: CTA bilaterally, no wheezing, no rhonchi Abdominal: Soft, NT, ND, bowel sounds + Extremities: much improved pretibial and LE edema, with wrinkling of skin, no cyanosis   The results of significant diagnostics from this hospitalization (including imaging, microbiology, ancillary and laboratory) are listed below for reference.     Microbiology: Recent Results (from the past 240 hour(s))  Urine Culture     Status: Abnormal (Preliminary result)   Collection Time: 12/12/20  4:58 PM   Specimen: Urine, Clean Catch  Result Value Ref Range Status   Specimen Description   Final    URINE, CLEAN CATCH Performed at [facility], [address], [place], [place] [address]    Special Requests   Final    NONE Performed at [facility], [address], [place], [place] [address]    Culture (A)  Final    >=100,000 COLONIES/mL ESCHERICHIA COLI >=100,000 COLONIES/mL PROTEUS MIRABILIS SUSCEPTIBILITIES TO FOLLOW Performed at [facility] [address], [place],  [address]    Report Status PENDING  Incomplete   Organism ID, Bacteria ESCHERICHIA COLI (A)  Final      Susceptibility   Escherichia coli - MIC*    AMPICILLIN <=2 SENSITIVE Sensitive     CEFAZOLIN <=4 SENSITIVE Sensitive     CEFEPIME <=0.12 SENSITIVE Sensitive     CEFTRIAXONE <=0.25 SENSITIVE Sensitive     CIPROFLOXACIN <=0.25 SENSITIVE Sensitive     GENTAMICIN <=1 SENSITIVE Sensitive     IMIPENEM <=0.25 SENSITIVE Sensitive     NITROFURANTOIN <=16 SENSITIVE Sensitive     TRIMETH/SULFA <=20 SENSITIVE Sensitive     AMPICILLIN/SULBACTAM <=2 SENSITIVE Sensitive     PIP/TAZO <=4 SENSITIVE Sensitive     * >=100,000  COLONIES/mL ESCHERICHIA COLI  Resp Panel by RT-PCR (Flu A&B, Covid)     Status: None   Collection Time: 12/12/20  8:05 PM  Result Value Ref Range Status   SARS Coronavirus 2 by RT PCR NEGATIVE NEGATIVE Final    Comment: (NOTE) SARS-CoV-2 target nucleic acids are NOT DETECTED.  The SARS-CoV-2 RNA is generally detectable in upper respiratory specimens during the acute phase of infection. The lowest concentration of SARS-CoV-2 viral copies this assay can detect is 138 copies/mL. A negative result does not preclude SARS-Cov-2 infection and should not be used as the sole basis for treatment or other patient management decisions. A negative result may occur with  improper specimen collection/handling, submission of specimen other than nasopharyngeal swab, presence of viral mutation(s) within the areas targeted by this assay, and inadequate number of viral copies(<138 copies/mL). A negative result must be combined with clinical observations, patient history, and epidemiological information. The expected result is Negative.  Fact Sheet for Patients:  EntrepreneurPulse.com.au  Fact Sheet for Healthcare Providers:  IncredibleEmployment.be  This test is no t yet approved or cleared by the [place] FDA and  has been authorized for detection and/or diagnosis of SARS-CoV-2 by FDA under an Emergency Use Authorization (EUA). This EUA will remain  in effect (meaning this test can be used) for the duration of the COVID-19 declaration under Section 564(b)(1) of the Act, 21 U.S.C.section 360bbb-3(b)(1), unless the authorization is terminated  or revoked sooner.  Influenza A by PCR NEGATIVE NEGATIVE Final   Influenza B by PCR NEGATIVE NEGATIVE Final    Comment: (NOTE) The Xpert Xpress SARS-CoV-2/FLU/RSV plus assay is intended as an aid in the diagnosis of influenza from Nasopharyngeal swab specimens and should not be used as a sole basis for  treatment. Nasal washings and aspirates are unacceptable for Xpert Xpress SARS-CoV-2/FLU/RSV testing.  Fact Sheet for Patients: EntrepreneurPulse.com.au  Fact Sheet for Healthcare Providers: IncredibleEmployment.be  This test is not yet approved or cleared by the [place] FDA and has been authorized for detection and/or diagnosis of SARS-CoV-2 by FDA under an Emergency Use Authorization (EUA). This EUA will remain in effect (meaning this test can be used) for the duration of the COVID-19 declaration under Section 564(b)(1) of the Act, 21 U.S.C. section 360bbb-3(b)(1), unless the authorization is terminated or revoked.  Performed at [facility], [address] [place], [place] [address]   MRSA Next Gen by PCR, Nasal     Status: None   Collection Time: 12/12/20 10:51 PM   Specimen: Nasal Mucosa; Nasal Swab  Result Value Ref Range Status   MRSA by PCR Next Gen NOT DETECTED NOT DETECTED Final    Comment: (NOTE) The GeneXpert MRSA Assay (FDA approved for NASAL specimens only), is one component of a comprehensive MRSA colonization surveillance program. It is not intended to diagnose MRSA infection nor to guide or monitor treatment for MRSA infections. Test performance is not FDA approved in patients less than 43 years old. Performed at [facility], [address] [place], [place] [address]   Culture, blood (Routine X 2) w Reflex to ID Panel     Status: None (Preliminary result)   Collection Time: 12/13/20  1:35 AM   Specimen: BLOOD  Result Value Ref Range Status   Specimen Description BLOOD BLOOD RIGHT HAND  Final   Special Requests   Final    BOTTLES DRAWN AEROBIC AND ANAEROBIC Blood Culture adequate volume   Culture   Final    NO GROWTH 3 DAYS Performed at [facility], [address] [place], [place] [address]    Report Status PENDING  Incomplete  Culture, blood (Routine X 2) w Reflex to ID Panel     Status: None (Preliminary result)    Collection Time: 12/13/20  1:35 AM   Specimen: BLOOD  Result Value Ref Range Status   Specimen Description BLOOD BLOOD LEFT HAND  Final   Special Requests   Final    BOTTLES DRAWN AEROBIC AND ANAEROBIC Blood Culture adequate volume   Culture   Final    NO GROWTH 3 DAYS Performed at [facility], [address] [place],  [address]    Report Status PENDING  Incomplete     Labs: BNP (last 3 results) No results for input(s): BNP in the last 8760 hours. Basic Metabolic Panel: Recent Labs  Lab 12/12/20 1532 12/13/20 0435 12/14/20 0443 12/15/20 0612 12/16/20 0530  NA 133* 135 137 140 141  K 3.9 3.1* 4.0 3.6 3.9  CL 99 107 107 110 109  CO2 21* 22 22 23 26   GLUCOSE 75 85 72 91 86  BUN 8 7 6  <5* 5*  CREATININE 0.69 0.60 0.61 0.70 0.58  CALCIUM 7.8* 6.8* 7.5* 8.0* 8.2*  MG  --  1.7 2.3 2.0  --   PHOS  --  2.7  --   --   --    Liver Function Tests: Recent Labs  Lab 12/12/20 1532 12/13/20 0435 12/14/20 0443  12/15/20 0612 12/16/20 0538  AST 65* 43* 62* 75*  --   ALT 34 24 21 21   --   ALKPHOS 237* 176* 163* 188*  --   BILITOT 1.3* 1.0 1.2 0.5  --   PROT 6.2* 4.5* 5.1* 5.2*  --   ALBUMIN 2.6* 1.9* 2.8* 3.1* 3.5   Recent Labs  Lab 12/12/20 1532  LIPASE 16   No results for input(s): AMMONIA in the last 168 hours. CBC: Recent Labs  Lab 12/12/20 1538 12/13/20 0435 12/13/20 0708 12/14/20 0443 12/15/20 0612  WBC 18.2* 11.2*  --  8.0 7.8  NEUTROABS 12.7*  --   --  4.8 4.3  HGB 11.2* 8.4* 8.9* 7.9* 8.1*  HCT 35.6* 25.0* 26.8* 24.1* 24.6*  MCV 93.7 89.0  --  89.6 91.8  PLT 561* 455*  --  520* 544*   Cardiac Enzymes: No results for input(s): CKTOTAL, CKMB, CKMBINDEX, TROPONINI in the last 168 hours. BNP: Invalid input(s): POCBNP CBG: No results for input(s): GLUCAP in the last 168 hours. D-Dimer No results for input(s): DDIMER in the last 72 hours. Hgb A1c No results for input(s): HGBA1C in the last 72 hours. Lipid Profile No results for input(s):  CHOL, HDL, LDLCALC, TRIG, CHOLHDL, LDLDIRECT in the last 72 hours. Thyroid function studies No results for input(s): TSH, T4TOTAL, T3FREE, THYROIDAB in the last 72 hours.  Invalid input(s): FREET3 Anemia work up No results for input(s): VITAMINB12, FOLATE, FERRITIN, TIBC, IRON, RETICCTPCT in the last 72 hours. Urinalysis    Component Value Date/Time   COLORURINE AMBER (A) 12/12/2020 1658   APPEARANCEUR CLOUDY (A) 12/12/2020 1658   LABSPEC 1.017 12/12/2020 1658   PHURINE 5.0 12/12/2020 1658   GLUCOSEU NEGATIVE 12/12/2020 1658   HGBUR SMALL (A) 12/12/2020 1658   BILIRUBINUR NEGATIVE 12/12/2020 1658   KETONESUR NEGATIVE 12/12/2020 1658   PROTEINUR 100 (A) 12/12/2020 1658   NITRITE POSITIVE (A) 12/12/2020 1658   LEUKOCYTESUR LARGE (A) 12/12/2020 1658   Sepsis Labs Invalid input(s): PROCALCITONIN,  WBC,  LACTICIDVEN Microbiology Recent Results (from the past 240 hour(s))  Urine Culture     Status: Abnormal (Preliminary result)   Collection Time: 12/12/20  4:58 PM   Specimen: Urine, Clean Catch  Result Value Ref Range Status   Specimen Description   Final    URINE, CLEAN CATCH Performed at [facility], [address], [place], [place] [address]    Special Requests   Final    NONE Performed at [facility], [address] [place], [place] [address]    Culture (A)  Final    >=100,000 COLONIES/mL ESCHERICHIA COLI >=100,000 COLONIES/mL PROTEUS MIRABILIS SUSCEPTIBILITIES TO FOLLOW Performed at [facility], [place] [address] [place],  [address]    Report Status PENDING  Incomplete   Organism ID, Bacteria ESCHERICHIA COLI (A)  Final      Susceptibility   Escherichia coli - MIC*    AMPICILLIN <=2 SENSITIVE Sensitive     CEFAZOLIN <=4 SENSITIVE Sensitive     CEFEPIME <=0.12 SENSITIVE Sensitive     CEFTRIAXONE <=0.25 SENSITIVE Sensitive     CIPROFLOXACIN <=0.25 SENSITIVE Sensitive     GENTAMICIN <=1 SENSITIVE Sensitive     IMIPENEM <=0.25 SENSITIVE Sensitive      NITROFURANTOIN <=16 SENSITIVE Sensitive     TRIMETH/SULFA <=20 SENSITIVE Sensitive     AMPICILLIN/SULBACTAM <=2 SENSITIVE Sensitive     PIP/TAZO <=4 SENSITIVE Sensitive     * >=100,000 COLONIES/mL ESCHERICHIA COLI  Resp Panel by RT-PCR (Flu  A&B, Covid)     Status: None   Collection Time: 12/12/20  8:05 PM  Result Value Ref Range Status   SARS Coronavirus 2 by RT PCR NEGATIVE NEGATIVE Final    Comment: (NOTE) SARS-CoV-2 target nucleic acids are NOT DETECTED.  The SARS-CoV-2 RNA is generally detectable in upper respiratory specimens during the acute phase of infection. The lowest concentration of SARS-CoV-2 viral copies this assay can detect is 138 copies/mL. A negative result does not preclude SARS-Cov-2 infection and should not be used as the sole basis for treatment or other patient management decisions. A negative result may occur with  improper specimen collection/handling, submission of specimen other than nasopharyngeal swab, presence of viral mutation(s) within the areas targeted by this assay, and inadequate number of viral copies(<138 copies/mL). A negative result must be combined with clinical observations, patient history, and epidemiological information. The expected result is Negative.  Fact Sheet for Patients:  EntrepreneurPulse.com.au  Fact Sheet for Healthcare Providers:  IncredibleEmployment.be  This test is no t yet approved or cleared by the [place] FDA and  has been authorized for detection and/or diagnosis of SARS-CoV-2 by FDA under an Emergency Use Authorization (EUA). This EUA will remain  in effect (meaning this test can be used) for the duration of the COVID-19 declaration under Section 564(b)(1) of the Act, 21 U.S.C.section 360bbb-3(b)(1), unless the authorization is terminated  or revoked sooner.       Influenza A by PCR NEGATIVE NEGATIVE Final   Influenza B by PCR NEGATIVE NEGATIVE Final    Comment:  (NOTE) The Xpert Xpress SARS-CoV-2/FLU/RSV plus assay is intended as an aid in the diagnosis of influenza from Nasopharyngeal swab specimens and should not be used as a sole basis for treatment. Nasal washings and aspirates are unacceptable for Xpert Xpress SARS-CoV-2/FLU/RSV testing.  Fact Sheet for Patients: EntrepreneurPulse.com.au  Fact Sheet for Healthcare Providers: IncredibleEmployment.be  This test is not yet approved or cleared by the [place] FDA and has been authorized for detection and/or diagnosis of SARS-CoV-2 by FDA under an Emergency Use Authorization (EUA). This EUA will remain in effect (meaning this test can be used) for the duration of the COVID-19 declaration under Section 564(b)(1) of the Act, 21 U.S.C. section 360bbb-3(b)(1), unless the authorization is terminated or revoked.  Performed at [facility], [address], [place], [place] [address]   MRSA Next Gen by PCR, Nasal     Status: None   Collection Time: 12/12/20 10:51 PM   Specimen: Nasal Mucosa; Nasal Swab  Result Value Ref Range Status   MRSA by PCR Next Gen NOT DETECTED NOT DETECTED Final    Comment: (NOTE) The GeneXpert MRSA Assay (FDA approved for NASAL specimens only), is one component of a comprehensive MRSA colonization surveillance program. It is not intended to diagnose MRSA infection nor to guide or monitor treatment for MRSA infections. Test performance is not FDA approved in patients less than 103 years old. Performed at [facility], [address], [place], [place] [address]   Culture, blood (Routine X 2) w Reflex to ID Panel     Status: None (Preliminary result)   Collection Time: 12/13/20  1:35 AM   Specimen: BLOOD  Result Value Ref Range Status   Specimen Description BLOOD BLOOD RIGHT HAND  Final   Special Requests   Final    BOTTLES DRAWN AEROBIC AND ANAEROBIC Blood Culture adequate volume   Culture   Final    NO GROWTH 3 DAYS Performed  at [facility], [address], [place], [place] [address]    Report Status PENDING  Incomplete  Culture, blood (Routine X 2) w Reflex to ID Panel     Status: None (Preliminary result)   Collection Time: 12/13/20  1:35 AM   Specimen: BLOOD  Result Value Ref Range Status   Specimen Description BLOOD BLOOD LEFT HAND  Final   Special Requests   Final    BOTTLES DRAWN AEROBIC AND ANAEROBIC Blood Culture adequate volume   Culture   Final    NO GROWTH 3 DAYS Performed at [facility], [address], [place], [place] [address]    Report Status PENDING  Incomplete   Time coordinating discharge: 38 mins  SIGNED:  Irwin Brakeman, MD  [facility] 12/16/2020, 12:29 PM How to contact the [facility] Attending or Consulting provider [place] or covering provider during after hours [place], for this patient?  Check the care team in [facility] and look for a) attending/consulting [facility] provider listed and b) the [facility] team listed Log into [email or web address] and use [facility] universal password to access. If you do not have the password, please contact the hospital operator. Locate the [facility] provider you are looking for under [facility] and page to a number that you can be directly reached. If you still have difficulty reaching the provider, please page the [facility] (Director on Call) for the Hospitalists listed on amion for assistance.

## 2020-12-16 NOTE — Progress Notes (Signed)
AVS given and reviewed with pt. Medications discussed. All questions answered to satisfaction. Pt verbalized understanding of information given. Pt escorted off the unit with all belongings via wheelchair by staff member.

## 2020-12-16 NOTE — Discharge Instructions (Signed)
IMPORTANT INFORMATION: PAY CLOSE ATTENTION   PHYSICIAN DISCHARGE INSTRUCTIONS  Follow with Primary care provider  Pcp, No  and other consultants as instructed by your Hospitalist Physician  [place] IF SYMPTOMS COME BACK, WORSEN OR NEW PROBLEM DEVELOPS   Please note: You were cared for by a hospitalist during your hospital stay. Every effort will be made to forward records to your primary care provider.  You can request that your primary care provider send for your hospital records if they have not received them.  Once you are discharged, your primary care physician will handle any further medical issues. Please note that NO REFILLS for any discharge medications will be authorized once you are discharged, as it is imperative that you return to your primary care physician (or establish a relationship with a primary care physician if you do not have one) for your post hospital discharge needs so that they can reassess your need for medications and monitor your lab values.  Please get a complete blood count and chemistry panel checked by your Primary MD at your next visit, and again as instructed by your Primary MD.  Get Medicines reviewed and adjusted: Please take all your medications with you for your next visit with your Primary MD  Laboratory/radiological data: Please request your Primary MD to go over all hospital tests and procedure/radiological results at the follow up, please ask your primary care provider to get all Hospital records sent to his/her office.  In some cases, they will be blood work, cultures and biopsy results pending at the time of your discharge. Please request that your primary care provider follow up on these results.  If you are diabetic, please bring your blood sugar readings with you to your follow up appointment with primary care.    Please call and make your follow up appointments as soon as possible.    Also Note the  following: If you experience worsening of your admission symptoms, develop shortness of breath, life threatening emergency, suicidal or homicidal thoughts you must seek medical attention immediately by calling 911 or calling your MD immediately  if symptoms less severe.  You must read complete instructions/literature along with all the possible adverse reactions/side effects for all the Medicines you take and that have been prescribed to you. Take any new Medicines after you have completely understood and accpet all the possible adverse reactions/side effects.   Do not drive when taking Pain medications or sleeping medications (Benzodiazepines)  Do not take more than prescribed Pain, Sleep and Anxiety Medications. It is not advisable to combine anxiety,sleep and pain medications without talking with your primary care practitioner  Special Instructions: If you have smoked or chewed Tobacco  in the last 2 yrs please stop smoking, stop any regular Alcohol  and or any Recreational drug use.  Wear Seat belts while driving.  Do not drive if taking any narcotic, mind altering or controlled substances or recreational drugs or alcohol.

## 2020-12-18 ENCOUNTER — Encounter (HOSPITAL_COMMUNITY): Payer: Self-pay | Admitting: Hematology

## 2020-12-18 LAB — CULTURE, BLOOD (ROUTINE X 2)
Culture: NO GROWTH
Culture: NO GROWTH
Special Requests: ADEQUATE
Special Requests: ADEQUATE

## 2020-12-26 NOTE — Progress Notes (Addendum)
Cardiology Office Note    Date:  12/27/2020   ID:  Sharon Lawrence, DOB 1965/06/06, MRN [id number]  PCP:  Merryl Hacker, No  Cardiologist: Carlyle Dolly, MD    Chief Complaint  Patient presents with   Hospitalization Follow-up    History of Present Illness:    Sharon Lawrence is a 55 y.o. female with past medical history of CAD (s/p STEMI in 09/2017 with DES to RCA, low-risk NST in 06/2020), HLD, chronic anemia, gastroparesis, celiac disease and prior tobacco use who presents to the office today for hospital follow-up.   She was last examined by myself in 06/2020 and reported still having nausea, vomiting and frequent diarrhea which had been occurring for several weeks and it was recommended she follow-up with GI. Statin therapy was held given her recent elevated LFT's.   In the interim, she was admitted to [facility] from 10/11 - 12/16/2020 for sepsis in the setting of a UTI. She did report having nausea and vomiting and GI was consulted with CT showing hyperdense lesions along the liver with outpatient MRI recommended. Symptoms during admission were felt to be due to uncontrolled celiac disease and due to gastroparesis. She did have issues with lower extremity edema during admission and received intermittent doses of IV Lasix which was transitioned to PRN Torsemide 2m at discharge.   In talking with the patient and her family member today, she reports that her weight had increased to over 200 lbs during her admission secondary to the amount of fluid she received but she has been taking Torsemide 10 mg daily since hospital discharge and reports significant improvement in her abdominal distention and lower extremity edema. Weight is now down to 181 lbs on the office scales today. Reports her breathing has overall been stable and she denies any specific orthopnea or PND. No recent exertional chest pain. She does experience occasional palpitations and in reviewing her medications, she did self-discontinue  Lopressor several months ago due to fatigue (previously on Lopressor 25 mg twice daily).   Past Medical History:  Diagnosis Date   ADHD (attention deficit hyperactivity disorder)    Anxiety    B12 deficiency 02/07/2011   Back pain    CAD (coronary artery disease)    a. s/p recent STEMI on 09/15/2017 with DES to RCA   Celiac disease    Folic acid deficiency 138/09/5641  Headache    Iron deficiency anemia 02/07/2011   Myocardial infarction ([place] 09/15/2017    Past Surgical History:  Procedure Laterality Date   BIOPSY  07/12/2020   Procedure: BIOPSY;  Surgeon: RRogene Houston MD;  [facility];  Service: Endoscopy;;  duodenum(2nd part);antral   CHOLECYSTECTOMY     COLONOSCOPY WITH PROPOFOL N/A 09/26/2017   rehman examined portion of ileum normal, - Five small, non-bleeding polyps in the rectum, in the sigmoid colon and at the hepatic   CORONARY STENT INTERVENTION Right 09/15/2017   Procedure: CORONARY STENT INTERVENTION;  Surgeon: VJettie Booze MD;  [facility];  Service: Cardiovascular;  Laterality: Right;  RCA   ESOPHAGOGASTRODUODENOSCOPY (EGD) WITH PROPOFOL N/A 07/12/2020   rehman: normal hypopharynx, Normal esophagus. z line regular 35 cm from incisors, congested and erythematous mucosa in antrum, mucosal changes in duodenum, very pronounced changes to suggest active celiac disease   KNEE CARTILAGE SURGERY Right    LEFT HEART CATH AND CORONARY ANGIOGRAPHY N/A 09/15/2017   Procedure: LEFT HEART CATH AND CORONARY ANGIOGRAPHY;  Surgeon: VJettie Booze  MD;  Location: [place] [facility] [facility];  Service: Cardiovascular;  Laterality: N/A;   TONSILLECTOMY      Current Medications: Outpatient Medications Prior to Visit  Medication Sig Dispense Refill   aspirin 81 MG chewable tablet Chew 1 tablet (81 mg total) by mouth daily.     famotidine (PEPCID) 40 MG tablet Take 1 tablet (40 mg total) by mouth at bedtime. 90 tablet 1   ibuprofen (ADVIL) 800 MG  tablet Take 1 tablet (800 mg total) by mouth every 8 (eight) hours as needed for mild pain, moderate pain, fever, cramping or headache. 30 tablet 0   ketoconazole (NIZORAL) 2 % cream Apply topically 2 (two) times daily. 60 g 1   levonorgestrel (MIRENA) 20 MCG/24HR IUD by Intrauterine route.     loperamide (IMODIUM) 2 MG capsule Take 1 capsule (2 mg total) by mouth 3 (three) times daily as needed for diarrhea or loose stools. 15 capsule 0   metoCLOPramide (REGLAN) 10 MG tablet Take 1 tablet (10 mg total) by mouth every 8 (eight) hours as needed for nausea. 90 tablet 2   nitroGLYCERIN (NITROLINGUAL) 0.4 MG/SPRAY spray Place 2 sprays under the tongue every 5 (five) minutes x 3 doses as needed.     nystatin (MYCOSTATIN/NYSTOP) powder Apply topically 3 (three) times daily as needed (yeast infection). 15 g 1   omeprazole (PRILOSEC) 40 MG capsule Take 1 capsule (40 mg total) by mouth daily. 90 capsule 3   ondansetron (ZOFRAN-ODT) 8 MG disintegrating tablet Take 1 tablet (8 mg total) by mouth 3 (three) times daily as needed for nausea. 20 tablet 3   oxyCODONE (OXY IR/ROXICODONE) 5 MG immediate release tablet Take 1 tablet (5 mg total) by mouth every 8 (eight) hours as needed for severe pain. Must last 30 days. 90 tablet 0   pregabalin (LYRICA) 150 MG capsule Take 150 mg by mouth 2 (two) times daily.     prochlorperazine (COMPAZINE) 10 MG tablet TAKE (1) TABLET BY MOUTH EVERY SIX HOURS AS NEEDED NAUSEA OR VOMITING. (Patient taking differently: Take 10 mg by mouth every 6 (six) hours as needed for nausea or vomiting.) 30 tablet 1   rizatriptan (MAXALT) 10 MG tablet Take 10 mg by mouth as needed.     torsemide (DEMADEX) 20 MG tablet Take 1 tablet (20 mg total) by mouth daily as needed (edema). As needed.  2   Vitamin D, Ergocalciferol, (DRISDOL) 1.25 MG (50000 UT) CAPS capsule Take 50,000 Units by mouth every 7 (seven) days. Wednesday     oxyCODONE (OXY IR/ROXICODONE) 5 MG immediate release tablet Take 1 tablet  (5 mg total) by mouth every 8 (eight) hours as needed for severe pain. Must last 30 days. 90 tablet 0   No facility-administered medications prior to visit.     Allergies:   Bee venom, Erythromycin, Cephalexin, Neomycin-bacitracin-polymyxin  [bacitracin-neomycin-polymyxin], Neosporin original [bacitracin-neomycin-polymyxin], Prednisone, Sulfa antibiotics, Tobrex [tobramycin], Tramadol, and Penicillins   Social History   Socioeconomic History   Marital status: Widowed    Spouse name: Not on file   Number of children: 3   Years of education: Not on file   Highest education level: Associate degree: occupational, Hotel manager, or vocational program  Occupational History   Not on file  Tobacco Use   Smoking status: Former    Packs/day: 0.25    Years: 12.00    Pack years: 3.00    Types: Cigarettes    Quit date: 04/04/2017    Years since quitting: 3.7   Smokeless  tobacco: Never  Vaping Use   Vaping Use: Never used  Substance and Sexual Activity   Alcohol use: No   Drug use: No   Sexual activity: Not Currently  Other Topics Concern   Not on file  Social History Narrative   Not on file   Social Determinants of Health   Financial Resource Strain: Not on file  Food Insecurity: Not on file  Transportation Needs: Not on file  Physical Activity: Not on file  Stress: Not on file  Social Connections: Not on file     Family History:  The patient's family history includes Anxiety disorder in her sister; Bipolar disorder in her son; CAD in her maternal grandfather and paternal grandmother; Cancer in her mother and sister; Cirrhosis in her paternal grandfather; Depression in her sister; Diabetes in her paternal grandmother; Drug abuse in her son; Hypertension in her father and mother; Hypothyroidism in her father and paternal grandmother.   Review of Systems:    Please see the history of present illness.     All other systems reviewed and are otherwise negative except as noted  above.   Physical Exam:    VS:  BP 120/66   Pulse (!) 110   Ht 5' (1.524 m)   Wt 181 lb 6.4 oz (82.3 kg)   SpO2 97%   BMI 35.43 kg/m    General: Well developed, well nourished,female appearing in no acute distress. Head: Normocephalic, atraumatic. Neck: No carotid bruits. JVD not elevated.  Lungs: Respirations regular and unlabored, without wheezes or rales.  Heart: Regular rate and rhythm. No S3 or S4.  No murmur, no rubs, or gallops appreciated. Abdomen: Appears mildly distended. No obvious abdominal masses. Msk:  Strength and tone appear normal for age. No obvious joint deformities or effusions. Extremities: No clubbing or cyanosis. Trace lower extremity edema.  Distal pedal pulses are 2+ bilaterally. Neuro: Alert and oriented X 3. Moves all extremities spontaneously. No focal deficits noted. Psych:  Responds to questions appropriately with a normal affect. Skin: No rashes or lesions noted  Wt Readings from Last 3 Encounters:  12/27/20 181 lb 6.4 oz (82.3 kg)  12/16/20 205 lb 7.5 oz (93.2 kg)  11/28/20 192 lb 8 oz (87.3 kg)     Studies/Labs Reviewed:   EKG:  EKG is not ordered today.    Recent Labs: 12/15/2020: ALT 21; Hemoglobin 8.1; Platelets 544 12/27/2020: B Natriuretic Peptide 69.0; BUN 9; Creatinine, Ser 0.50; Magnesium 2.1; Potassium 3.2; Sodium 137   Lipid Panel    Component Value Date/Time   CHOL 77 09/16/2017 0358   TRIG 64 09/16/2017 0358   HDL 19 (L) 09/16/2017 0358   CHOLHDL 4.1 09/16/2017 0358   VLDL 13 09/16/2017 0358   LDLCALC 45 09/16/2017 0358    Additional studies/ records that were reviewed today include:   Echocardiogram: 01/2020 IMPRESSIONS     1. Left ventricular ejection fraction, by estimation, is 65 to 70%. The  left ventricle has normal function. The left ventricle has no regional  wall motion abnormalities. There is mild left ventricular hypertrophy.  Left ventricular diastolic parameters  were normal.   2. Right ventricular  systolic function is normal. The right ventricular  size is normal.   3. The mitral valve is normal in structure. No evidence of mitral valve  regurgitation. No evidence of mitral stenosis.   4. The aortic valve is tricuspid. Aortic valve regurgitation is not  visualized. No aortic stenosis is present.   5. The inferior  vena cava is normal in size with greater than 50%  respiratory variability, suggesting right atrial pressure of 3 mmHg.   NST: 06/2020 There was no ST segment deviation noted during stress. The study is normal. There are no perfusion defects consistent with prior infarct or current ischemia This is a low risk study. The left ventricular ejection fraction is hyperdynamic (>65%).    Assessment:    1. Coronary artery disease involving native coronary artery of native heart without angina pectoris   2. Bilateral lower extremity edema   3. Hyperlipidemia LDL goal <70   4. Palpitations   5. Medication management   6. Gastroparesis      Plan:   In order of problems listed above:  1. CAD - She is s/p STEMI in 09/2017 with DES to RCA and most recent ischemic evaluation was a low-risk NST in 06/2020. She does report palpitations but no recent exertional chest pain. - Continue ASA 81 mg daily and will restart beta-blocker therapy with Toprol-XL 25 mg daily. She does remain off of statin therapy for now given intermittently elevated LFT's.   2. Lower Extremity Edema - She developed worsening edema after receiving IV fluids during her admission for sepsis. Also had significantly low albumin during admission with a trough of 1.9. She reports her weight has declined by almost 20 pounds since admission and her edema has significantly improved. She remains on Torsemide 10 mg daily as she previously experienced more fatigue with higher dosing. Will recheck BNP, BMET and Mg today.   3. HLD - She was previously on Atorvastatin 40 mg daily but this has been held since 06/2020 given  elevated LFT's. AST was at 75 and ALT 21 on 12/15/2020. She is scheduled for an Abdominal MRI and if LFT's remain elevated, would consider adding Zetia or possible PCSK9 inhibitor therapy if unable to reinitiate statin therapy.  4. Palpitations - She does report intermittent palpitations and her heart rate is elevated in the 110's on initial check today, rechecked and improved into the 90's. She reports Lopressor 25 mg twice daily previously caused fatigue, therefore reviewed different options and will try Toprol-XL 25 mg daily. We reviewed that she could cut this in half if needed.   5. Gastroparesis/Celiac Disease  - She continues to experience intermittent nausea but says her appetite has overall improved when compared to earlier this year. Followed by Dr. Laural Golden.     Medication Adjustments/Labs and Tests Ordered: Current medicines are reviewed at length with the patient today.  Concerns regarding medicines are outlined above.  Medication changes, Labs and Tests ordered today are listed in the Patient Instructions below. Patient Instructions  Medication Instructions:   Start Taking Toprol XL 25 mg Daily   *If you need a refill on your cardiac medications before your next appointment, please call your pharmacy*   Lab Work: Your physician recommends that you return for lab work in: Today (BNP, BMP, Mag)   If you have labs (blood work) drawn today and your tests are completely normal, you will receive your results only by: [place] (if you have MyChart) OR A paper copy in the mail If you have any lab test that is abnormal or we need to change your treatment, we will call you to review the results.   Testing/Procedures: NONE    Follow-Up: At [facility], you and your health needs are our priority.  As part of our continuing mission to provide you with exceptional heart care, we have created  designated Provider Care Teams.  These Care Teams include your primary Cardiologist  (physician) and Advanced Practice Providers (APPs -  Physician Assistants and Nurse Practitioners) who all work together to provide you with the care you need, when you need it.  We recommend signing up for the patient portal called "MyChart".  Sign up information is provided on this After Visit Summary.  MyChart is used to connect with patients for Virtual Visits (Telemedicine).  Patients are able to view lab/test results, encounter notes, upcoming appointments, etc.  Non-urgent messages can be sent to your provider as well.   To learn more about what you can do with MyChart, go to [email or web address].    Your next appointment:   3 -4 month(s)  The format for your next appointment:   In Person  Provider:   Carlyle Dolly, MD or Bernerd Pho, PA-C   Other Instructions Thank you for choosing [facility]!     Signed, Erma Heritage, PA-C  12/27/2020 5:01 PM    [facility] [address] [place], [place] [address] Phone: [phone] Fax: [phone]

## 2020-12-27 ENCOUNTER — Other Ambulatory Visit: Payer: Self-pay

## 2020-12-27 ENCOUNTER — Encounter: Payer: Self-pay | Admitting: Student

## 2020-12-27 ENCOUNTER — Other Ambulatory Visit (HOSPITAL_COMMUNITY)
Admission: RE | Admit: 2020-12-27 | Discharge: 2020-12-27 | Disposition: A | Discharge status: Home / Self Care | Payer: Self-pay | Source: Ambulatory Visit | Attending: Student | Admitting: Student

## 2020-12-27 ENCOUNTER — Ambulatory Visit (INDEPENDENT_AMBULATORY_CARE_PROVIDER_SITE_OTHER): Payer: Self-pay | Admitting: Student

## 2020-12-27 VITALS — BP 120/66 | HR 110 | Ht 60.0 in | Wt 181.4 lb

## 2020-12-27 DIAGNOSIS — K3184 Gastroparesis: Secondary | ICD-10-CM

## 2020-12-27 DIAGNOSIS — Z79899 Other long term (current) drug therapy: Secondary | ICD-10-CM

## 2020-12-27 DIAGNOSIS — E785 Hyperlipidemia, unspecified: Secondary | ICD-10-CM

## 2020-12-27 DIAGNOSIS — I2510 Atherosclerotic heart disease of native coronary artery without angina pectoris: Secondary | ICD-10-CM

## 2020-12-27 DIAGNOSIS — R002 Palpitations: Secondary | ICD-10-CM

## 2020-12-27 DIAGNOSIS — R600 Localized edema: Secondary | ICD-10-CM

## 2020-12-27 LAB — BASIC METABOLIC PANEL
Anion gap: 9 (ref 5–15)
BUN: 9 mg/dL (ref 6–20)
CO2: 21 mmol/L — ABNORMAL LOW (ref 22–32)
Calcium: 8.6 mg/dL — ABNORMAL LOW (ref 8.9–10.3)
Chloride: 107 mmol/L (ref 98–111)
Creatinine, Ser: 0.5 mg/dL (ref 0.44–1.00)
GFR, Estimated: >60 mL/min (ref >60)
Glucose, Bld: 122 mg/dL — ABNORMAL HIGH (ref 70–99)
Potassium: 3.2 mmol/L — ABNORMAL LOW (ref 3.5–5.1)
Sodium: 137 mmol/L (ref 135–145)

## 2020-12-27 LAB — MAGNESIUM: Magnesium: 2.1 mg/dL (ref 1.7–2.4)

## 2020-12-27 LAB — BRAIN NATRIURETIC PEPTIDE: B Natriuretic Peptide: 69 pg/mL (ref 0.0–100.0)

## 2020-12-27 MED ORDER — METOPROLOL SUCCINATE ER 25 MG PO TB24: 25.0000 mg | ORAL_TABLET | Freq: Every day | ORAL | 3 refills | Status: DC

## 2020-12-27 NOTE — Patient Instructions (Signed)
Medication Instructions:   Start Taking Toprol XL 25 mg Daily   *If you need a refill on your cardiac medications before your next appointment, please call your pharmacy*   Lab Work: Your physician recommends that you return for lab work in: Today (BNP, BMP, Mag)   If you have labs (blood work) drawn today and your tests are completely normal, you will receive your results only by: [place] (if you have MyChart) OR A paper copy in the mail If you have any lab test that is abnormal or we need to change your treatment, we will call you to review the results.   Testing/Procedures: NONE    Follow-Up: At [facility], you and your health needs are our priority.  As part of our continuing mission to provide you with exceptional heart care, we have created designated Provider Care Teams.  These Care Teams include your primary Cardiologist (physician) and Advanced Practice Providers (APPs -  Physician Assistants and Nurse Practitioners) who all work together to provide you with the care you need, when you need it.  We recommend signing up for the patient portal called "MyChart".  Sign up information is provided on this After Visit Summary.  MyChart is used to connect with patients for Virtual Visits (Telemedicine).  Patients are able to view lab/test results, encounter notes, upcoming appointments, etc.  Non-urgent messages can be sent to your provider as well.   To learn more about what you can do with MyChart, go to [email or web address].    Your next appointment:   3 -4 month(s)  The format for your next appointment:   In Person  Provider:   Carlyle Dolly, MD or Bernerd Pho, PA-C   Other Instructions Thank you for choosing [facility]!

## 2020-12-28 ENCOUNTER — Telehealth: Payer: Self-pay

## 2020-12-28 MED ORDER — POTASSIUM CHLORIDE ER 10 MEQ PO TBCR: EXTENDED_RELEASE_TABLET | ORAL | 3 refills | Status: DC

## 2020-12-28 NOTE — Telephone Encounter (Signed)
Labs discussed with patient. Rx for potassium e-scribed to [facility], bmet order entered

## 2020-12-28 NOTE — Telephone Encounter (Signed)
-----   Message from Erma Heritage, [place] sent at 12/27/2020  7:02 PM EDT ----- Please let the patient know her magnesium and sodium are within a normal range and kidney function remains normal. Fluid level in relation to the heart is normal. Her potassium is slightly low at 3.2 so would have her start K-dur 10 mEq daily and take this daily while taking Torsemide daily but once able to reduce her Torsemide to just as needed dosing, can also reduce K-dur to as needed and only take when she takes Torsemide. Repeat BMET in 2 weeks for reassessment of K+ level.

## 2020-12-31 DIAGNOSIS — Z9114 Patient's other noncompliance with medication regimen: Secondary | ICD-10-CM | POA: Insufficient documentation

## 2020-12-31 DIAGNOSIS — R892 Abnormal level of other drugs, medicaments and biological substances in specimens from other organs, systems and tissues: Secondary | ICD-10-CM | POA: Insufficient documentation

## 2020-12-31 DIAGNOSIS — Z91148 Patient's other noncompliance with medication regimen for other reason: Secondary | ICD-10-CM | POA: Insufficient documentation

## 2020-12-31 DIAGNOSIS — F1190 Opioid use, unspecified, uncomplicated: Secondary | ICD-10-CM | POA: Insufficient documentation

## 2020-12-31 DIAGNOSIS — F1290 Cannabis use, unspecified, uncomplicated: Secondary | ICD-10-CM | POA: Insufficient documentation

## 2020-12-31 NOTE — Progress Notes (Signed)
PROVIDER NOTE: Information contained herein reflects review and annotations entered in association with encounter. Interpretation of such information and data should be left to medically-trained personnel. Information provided to patient can be located elsewhere in the medical record under "Patient Instructions". Document created using STT-dictation technology, any transcriptional errors that may result from process are unintentional.    Patient: Sharon Lawrence  Service Category: E/M  Provider: Gaspar Cola, MD  DOB: [birth year]  DOS: 01/01/2021  Specialty: Interventional Pain Management  MRN: [id number]  Setting: Ambulatory outpatient  PCP: Pcp, No  Type: Established Patient    Referring Provider: Vidal Schwalbe, MD  Location: Office  Delivery: Face-to-face     HPI  [name], a 55 y.o. year old female, is here today because of her No primary diagnosis found.. [name]'s primary complain today is Back Pain (Right side) Last encounter: My last encounter with her was on 10/31/2020. Pertinent problems: [name] has Bilateral lower extremity edema; Back muscle spasm; Right-sided low back pain with sciatica; Other chronic pain; Migraine; Chronic low back pain (Primary Area of Pain) (Bilateral) (R>L) w/ sciatica (Right); Chronic lower extremity pain (Secondary Area of Pain) (Right); Chronic sacroiliac joint pain (Bilateral) (L>R); Chronic pain syndrome; Abnormal MRI, lumbar spine (07/16/2016); DDD (degenerative disc disease), lumbar; DDD (degenerative disc disease), thoracic; Lumbar facet arthropathy (Bilateral); Lumbar facet syndrome (Bilateral); Neurogenic pain; Chronic musculoskeletal pain; Polyarthralgia; Spondylosis without myelopathy or radiculopathy, lumbosacral region; Chronic low back pain (Bilateral) w/o sciatica; and Lumbar facet synovial cyst (L5-S1) (Left) on their pertinent problem list. Pain Assessment: Severity of Chronic pain is reported as a 8 /10. Location: Back Right, Lower/pain  radiaties down her right leg to the top of her knee. Onset: More than a month ago. Quality: Aching, Constant. Timing: Constant. Modifying factor(s): meds, resting, heat. Vitals:  height is 5' (1.524 m) and weight is 181 lb (82.1 kg). Her temporal temperature is 97.1 F (36.2 C) (abnormal). Her blood pressure is 143/86 (abnormal) and her pulse is 102 (abnormal). Her respiration is 16 and oxygen saturation is 100%.   Reason for encounter: medication management.  Unfortunately, we recently identified the patient to be using illicit substances as well has methadone.  I checked the patient's PMP and there were no prescriptions for methadone.  This means that the medication was obtained in an illicit manner.  Because of this, they were notify the patient that we will no longer be prescribing him any more opioid analgesics.  The patient will be given a prescription to taper her opioids down after which, we will not start them again.  We will remain available for interventional therapies only, but as of today were discontinuing the opioid analgesic pharmacotherapy.  The patient came into the clinic today with her sister.  Before talking to the patient today I asked her for her permission to discuss her case in front of her sister.  She gave me the verbal consent.  My nurse was present in the room when the consent was provided.  I have notified the patient that I will no longer be prescribing any opioids for her due to the findings on her UDS.  When I notify her of this, she returned to her sister and she said "I told you so".  ABNORMAL UDS (10/18/2020) (+) for carboxy-THC (Marijuana) & EDDP (Methadone). NO-SHOW to procedure (10/31/2020).  RTCB: n/a Nonopioids transferred 12/23/2019: Baclofen and Lyrica  Pharmacotherapy Assessment  Analgesic: No chronic opioid analgesics therapy prescribed by our practice anymore due  to agreement violation. ABNORMAL UDS (10/18/2020) (+) for carboxy-THC (Marijuana) & EDDP  (Methadone). NO-SHOW to procedure (10/31/2020). MME/day: 0 mg/day.   Monitoring: [place] PMP: PDMP reviewed during this encounter.       Pharmacotherapy: No side-effects or adverse reactions reported. Compliance: No problems identified. Effectiveness: Clinically acceptable.  Sharon Fischer, RN  01/01/2021 11:36 AM  Sign when Signing Visit Nursing Pain Medication Assessment:  Safety precautions to be maintained throughout the outpatient stay will include: orient to surroundings, keep bed in low position, maintain call bell within reach at all times, provide assistance with transfer out of bed and ambulation.  Medication Inspection Compliance: Pill count conducted under aseptic conditions, in front of the patient. Neither the pills nor the bottle was removed from the patient's sight at any time. Once count was completed pills were immediately returned to the patient in their original bottle.  Medication: Oxycodone IR Pill/Patch Count:  83 of 90 pills remain Pill/Patch Appearance: Markings consistent with prescribed medication Bottle Appearance: Standard pharmacy container. Clearly labeled. Filled Date: 7 / 27 / 2022 Last Medication intake:  Today Safety precautions to be maintained throughout the outpatient stay will include: orient to surroundings, keep bed in low position, maintain call bell within reach at all times, provide assistance with transfer out of bed and ambulation.      UDS:  Summary  Date Value Ref Range Status  10/18/2020 Note  Final    Comment:    ==================================================================== [employer] (MW) ==================================================================== Test                             Result       Flag       Units  Drug Present and Declared for Prescription Verification   Oxycodone                      662          EXPECTED   ng/mg creat   Oxymorphone                    249          EXPECTED   ng/mg creat    Noroxycodone                   >2203        EXPECTED   ng/mg creat   Noroxymorphone                 734          EXPECTED   ng/mg creat    Sources of oxycodone are scheduled prescription medications.    Oxymorphone, noroxycodone, and noroxymorphone are expected    metabolites of oxycodone. Oxymorphone is also available as a    scheduled prescription medication.  Drug Present not Declared for Prescription Verification   Carboxy-THC                    2            UNEXPECTED ng/mg creat    Carboxy-THC is a metabolite of tetrahydrocannabinol (THC). Source of    THC is most commonly herbal marijuana or marijuana-based products,    but THC is also present in a scheduled prescription medication.    Trace amounts of THC can be present in hemp and cannabidiol (CBD)    products. This test is not intended to distinguish between  delta-9-    tetrahydrocannabinol, the predominant form of THC in most herbal or    marijuana-based products, and delta-8-tetrahydrocannabinol.    EDDP (Methadone Mtb)           406          UNEXPECTED ng/mg creat    Methadone metabolite (EDDP) is an expected metabolite of methadone.    Sources of methadone include scheduled prescription medications.  Drug Absent but Declared for Prescription Verification   Diazepam                       Not Detected UNEXPECTED ng/mg creat ==================================================================== Test                      Result    Flag   Units      Ref Range   Creatinine              454              mg/dL      >=20 ==================================================================== Declared Medications:  The flagging and interpretation on this report are based on the  following declared medications.  Unexpected results may arise from  inaccuracies in the declared medications.   **Note: The testing scope of this panel includes these medications:   Diazepam (Valium)  Oxycodone (Roxicodone)   **Note: The testing scope of  this panel does not include the  following reported medications:   Aspirin  Baclofen (Lioresal)  Duloxetine (Cymbalta)  Famotidine (Pepcid)  Fluticasone (Flonase)  Folic Acid  Levonorgestrel (Mirena)  Loperamide (Imodium)  Metoclopramide (Reglan)  Metoprolol (Lopressor)  Nitroglycerin (Nitrolingual)  Nystatin (Mycostatin)  Ondansetron (Zofran)  Pantoprazole (Protonix)  Pregabalin (Lyrica)  Probiotic  Prochlorperazine (Compazine)  Rizatriptan (Maxalt)  Torsemide (Demadex)  Vitamin D2 (Drisdol) ==================================================================== For clinical consultation, please call [phone]. ====================================================================      ROS  Constitutional: Denies any fever or chills Gastrointestinal: No reported hemesis, hematochezia, vomiting, or acute GI distress Musculoskeletal: Denies any acute onset joint swelling, redness, loss of ROM, or weakness Neurological: No reported episodes of acute onset apraxia, aphasia, dysarthria, agnosia, amnesia, paralysis, loss of coordination, or loss of consciousness  Medication Review  Vitamin D (Ergocalciferol), aspirin, famotidine, ibuprofen, ketoconazole, levonorgestrel, loperamide, metoCLOPramide, metoprolol succinate, nitroGLYCERIN, nystatin, omeprazole, ondansetron, oxyCODONE, potassium chloride, pregabalin, prochlorperazine, rizatriptan, and torsemide  History Review  Allergy: Ms. Sandoz is allergic to bee venom, erythromycin, cephalexin, neomycin-bacitracin-polymyxin  [bacitracin-neomycin-polymyxin], neosporin original [bacitracin-neomycin-polymyxin], prednisone, sulfa antibiotics, tobrex [tobramycin], tramadol, and penicillins. Drug: Ms. Camposano  reports no history of drug use. Alcohol:  reports no history of alcohol use. Tobacco:  reports that she quit smoking about 3 years ago. Her smoking use included cigarettes. She has a 3.00 pack-year smoking history. She has never used  smokeless tobacco. Social: Ms. Kimbell  reports that she quit smoking about 3 years ago. Her smoking use included cigarettes. She has a 3.00 pack-year smoking history. She has never used smokeless tobacco. She reports that she does not drink alcohol and does not use drugs. Medical:  has a past medical history of ADHD (attention deficit hyperactivity disorder), Anxiety, B12 deficiency (02/07/2011), Back pain, CAD (coronary artery disease), Celiac disease, Folic acid deficiency (02/07/2011), Headache, Iron deficiency anemia (02/07/2011), and Myocardial infarction ([place]) (09/15/2017). Surgical: Ms. Vandall  has a past surgical history that includes Tonsillectomy; Cholecystectomy; Knee cartilage surgery (Right); LEFT HEART CATH AND CORONARY ANGIOGRAPHY (N/A, 09/15/2017); CORONARY STENT INTERVENTION (Right, 09/15/2017); Colonoscopy with propofol (N/A, 09/26/2017);  Esophagogastroduodenoscopy (egd) with propofol (N/A, 07/12/2020); and biopsy (07/12/2020). Family: family history includes Anxiety disorder in her sister; Bipolar disorder in her son; CAD in her maternal grandfather and paternal grandmother; Cancer in her mother and sister; Cirrhosis in her paternal grandfather; Depression in her sister; Diabetes in her paternal grandmother; Drug abuse in her son; Hypertension in her father and mother; Hypothyroidism in her father and paternal grandmother.  Laboratory Chemistry Profile   Renal Lab Results  Component Value Date   BUN 9 12/27/2020   CREATININE 0.50 09/01/1599   BCR NOT APPLICABLE 09/32/3557   GFRAA >60 09/16/2019   GFRNONAA >60 12/27/2020    Hepatic Lab Results  Component Value Date   AST 75 (H) 12/15/2020   ALT 21 12/15/2020   ALBUMIN 3.5 12/16/2020   ALKPHOS 188 (H) 12/15/2020   HCVAB NON REACTIVE 07/12/2020   LIPASE 16 12/12/2020    Electrolytes Lab Results  Component Value Date   NA 137 12/27/2020   K 3.2 (L) 12/27/2020   CL 107 12/27/2020   CALCIUM 8.6 (L) 12/27/2020   MG 2.1  12/27/2020   PHOS 2.7 12/13/2020    Bone Lab Results  Component Value Date   VD25OH 17.25 (L) 09/16/2019   [id number] 47 04/25/2009   [id number] 47 04/25/2009   [id number]  04/25/2009    <8 (NOTE) Vitamin D3, 1,25(OH)2 indicates both endogenous production and supplementation.  Vitamin D2, 1,25(OH)2 is an indicator of exogeous sources, such as diet or supplementation.  Interpretation and therapy are based on measurement of Vitamin  D,1,25(OH)2, Total. This test was developed and its performance characteristics have been determined by [facility], [place], [place]. Performance characteristics refer to the analytical performance of the test.    Inflammation (CRP: Acute Phase) (ESR: Chronic Phase) Lab Results  Component Value Date   CRP <0.8 09/23/2018   ESRSEDRATE 7 09/23/2018   LATICACIDVEN 1.1 12/13/2020         Note: Above Lab results reviewed.  Recent Imaging Review  CT ABDOMEN PELVIS W CONTRAST CLINICAL DATA:  Abdominal pain, acute, nonlocalized. Pt c/o abdominal swelling x 3 months  EXAM: CT ABDOMEN AND PELVIS WITH CONTRAST  TECHNIQUE: Multidetector CT imaging of the abdomen and pelvis was performed using the standard protocol following bolus administration of intravenous contrast.  CONTRAST:  139m OMNIPAQUE IOHEXOL 300 MG/ML  SOLN  COMPARISON:  CT abdomen pelvis 06/25/2020, CT abdomen pelvis 06/10/2012  FINDINGS: Lower chest: Left lower lobe linear atelectasis.  Hepatobiliary: The liver is enlarged measuring up to 23cm. vague 0.9 cm right posterior hepatic lobe hyperdensity. Similar findings within the lateral abdomen urine left hepatic lobe (2:28) measuring up to 2.5 cm. The hepatic parenchyma is diffusely hypodense compared to the splenic parenchyma consistent with fatty infiltration. No focal liver abnormality. Status post cholecystectomy. No biliary dilatation.  Pancreas: No focal lesion. Normal pancreatic contour. No  surrounding inflammatory changes. No main pancreatic ductal dilatation.  Spleen: Normal in size without focal abnormality.  Adrenals/Urinary Tract: Bilateral and renal nodularity appears similar to prior measuring up to 2 cm bilaterally with associated density of 40-50 Hounsfield units.  Stomach/Bowel: Stomach is within normal limits. No evidence of bowel wall thickening or dilatation. Appendix appears normal.  Vascular/Lymphatic: No abdominal aorta or iliac aneurysm. Mild atherosclerotic plaque of the aorta and its branches. No abdominal, pelvic, or inguinal lymphadenopathy.  Reproductive: T-shaped intrauterine device noted within the uterus in grossly appropriate position within the endometrial canal. Uterus and bilateral adnexa are unremarkable.  Other: Trace volume  simple free fluid within the abdomen or pelvis. No intraperitoneal free gas. No organized fluid collection.  Musculoskeletal:  Subcutaneus soft tissue edema.  No suspicious lytic or blastic osseous lesions. No acute displaced fracture. Multilevel degenerative changes of the spine.  IMPRESSION: 1. Hepatomegaly and hepatic steatosis with several hyperdense lesions. Recommend nonemergent MRI liver protocol further evaluation. When the patient is clinically stable and able to follow directions and hold their breath (preferably as an outpatient) further evaluation with dedicated abdominal MRI should be considered. 2. Interval development of trace simple free fluid ascites. 3. T-shaped intrauterine device in grossly appropriate position.  Electronically Signed   By: Iven Finn M.D.   On: 12/12/2020 18:16 Note: Reviewed        Physical Exam  General appearance: Well nourished, well developed, and well hydrated. In no apparent acute distress Mental status: Alert, oriented x 3 (person, place, & time)       Respiratory: No evidence of acute respiratory distress Eyes: PERLA Vitals: BP (!) 143/86 (BP  Location: Right Arm, Patient Position: Sitting, Cuff Size: Normal)   Pulse (!) 102   Temp (!) 97.1 F (36.2 C) (Temporal)   Resp 16   Ht 5' (1.524 m)   Wt 181 lb (82.1 kg)   SpO2 100%   BMI 35.35 kg/m  BMI: Estimated body mass index is 35.35 kg/m as calculated from the following:   Height as of this encounter: 5' (1.524 m).   Weight as of this encounter: 181 lb (82.1 kg). Ideal: Ideal body weight: 45.5 kg (100 lb 4.9 oz) Adjusted ideal body weight: 60.1 kg (132 lb 9.4 oz)  Assessment   Status Diagnosis  Controlled Controlled Controlled 1. Pain medication agreement broken (10/18/2020)   2. Abnormal drug screen (10/18/2020)   3. Marijuana use   4. Methadone misuse   5. Lumbar facet syndrome (Bilateral)   6. Chronic low back pain (Bilateral) w/o sciatica   7. Encounter for chronic pain management   8. Lumbar facet arthropathy (Bilateral)   9. DDD (degenerative disc disease), lumbar   10. Spondylosis without myelopathy or radiculopathy, lumbosacral region   11. Chronic pain syndrome   12. Pharmacologic therapy   13. Chronic use of opiate for therapeutic purpose   14. Encounter for medication management      Updated Problems: No problems updated.  Plan of Care  Problem-specific:  No problem-specific Assessment & Plan notes found for this encounter.  Ms. GENIFER LAZENBY has a current medication list which includes the following long-term medication(s): famotidine, levonorgestrel, metoclopramide, metoprolol succinate, nitroglycerin, omeprazole, potassium chloride, prochlorperazine, rizatriptan, torsemide, and [START ON 01/16/2021] oxycodone.  Pharmacotherapy (Medications Ordered): Meds ordered this encounter  Medications   oxyCODONE (OXY IR/ROXICODONE) 5 MG immediate release tablet    Sig: Take 1 tablet (5 mg total) by mouth in the morning and at bedtime for 7 days, THEN 1 tablet (5 mg total) daily for 7 days.    Dispense:  21 tablet    Refill:  0    Prescription is part of  a downward taper. Fill prescriptions in the correct order. Failure to do so may trigger withdrawal syndrome. Fill date: 01/16/2021. To last until: 01/29/2021.    Orders:  No orders of the defined types were placed in this encounter.  Follow-up plan:   Return if symptoms worsen or fail to improve.     Interventional Therapies  Risk  Complexity Considerations:   WNL   Planned  Pending:   Diagnostic right lumbar  facet block #2    Under consideration:   Diagnostic bilateral lumbar facet block #2  Possible bilateral lumbar facet RFA  Diagnostic right SI joint block  Possible right SI joint RFA    Completed:   Diagnostic bilateral lumbar facet block x1 (12/09/2019) (100/50/50/50)   Therapeutic  Palliative (PRN) options:   Diagnostic bilateral lumbar facet block #2     Recent Visits Date Type Provider Dept  10/18/20 Office Visit Milinda Pointer, MD [facility]  Showing recent visits within past 90 days and meeting all other requirements Today's Visits Date Type Provider Dept  01/01/21 Office Visit Milinda Pointer, MD [facility]  Showing today's visits and meeting all other requirements Future Appointments No visits were found meeting these conditions. Showing future appointments within next 90 days and meeting all other requirements I discussed the assessment and treatment plan with the patient. The patient was provided an opportunity to ask questions and all were answered. The patient agreed with the plan and demonstrated an understanding of the instructions.  Patient advised to call back or seek an in-person evaluation if the symptoms or condition worsens.  Duration of encounter: 30 minutes.  Note by: Gaspar Cola, MD Date: 01/01/2021; Time: 11:46 AM

## 2021-01-01 ENCOUNTER — Other Ambulatory Visit: Payer: Self-pay

## 2021-01-01 ENCOUNTER — Encounter: Payer: Self-pay | Admitting: Pain Medicine

## 2021-01-01 ENCOUNTER — Ambulatory Visit: Payer: Self-pay | Attending: Pain Medicine | Admitting: Pain Medicine

## 2021-01-01 DIAGNOSIS — G894 Chronic pain syndrome: Secondary | ICD-10-CM

## 2021-01-01 DIAGNOSIS — M545 Low back pain, unspecified: Secondary | ICD-10-CM

## 2021-01-01 DIAGNOSIS — F1190 Opioid use, unspecified, uncomplicated: Secondary | ICD-10-CM

## 2021-01-01 DIAGNOSIS — F1290 Cannabis use, unspecified, uncomplicated: Secondary | ICD-10-CM

## 2021-01-01 DIAGNOSIS — Z9114 Patient's other noncompliance with medication regimen: Secondary | ICD-10-CM | POA: Insufficient documentation

## 2021-01-01 DIAGNOSIS — R892 Abnormal level of other drugs, medicaments and biological substances in specimens from other organs, systems and tissues: Secondary | ICD-10-CM

## 2021-01-01 DIAGNOSIS — M51369 Other intervertebral disc degeneration, lumbar region without mention of lumbar back pain or lower extremity pain: Secondary | ICD-10-CM

## 2021-01-01 DIAGNOSIS — M47816 Spondylosis without myelopathy or radiculopathy, lumbar region: Secondary | ICD-10-CM

## 2021-01-01 DIAGNOSIS — G8929 Other chronic pain: Secondary | ICD-10-CM

## 2021-01-01 DIAGNOSIS — Z91148 Patient's other noncompliance with medication regimen for other reason: Secondary | ICD-10-CM

## 2021-01-01 DIAGNOSIS — Z79899 Other long term (current) drug therapy: Secondary | ICD-10-CM

## 2021-01-01 DIAGNOSIS — M47817 Spondylosis without myelopathy or radiculopathy, lumbosacral region: Secondary | ICD-10-CM

## 2021-01-01 DIAGNOSIS — M5136 Other intervertebral disc degeneration, lumbar region: Secondary | ICD-10-CM | POA: Insufficient documentation

## 2021-01-01 DIAGNOSIS — Z79891 Long term (current) use of opiate analgesic: Secondary | ICD-10-CM

## 2021-01-01 MED ORDER — OXYCODONE HCL 5 MG PO TABS: ORAL_TABLET | ORAL | 0 refills | Status: DC

## 2021-01-01 NOTE — Progress Notes (Signed)
Nursing Pain Medication Assessment:  Safety precautions to be maintained throughout the outpatient stay will include: orient to surroundings, keep bed in low position, maintain call bell within reach at all times, provide assistance with transfer out of bed and ambulation.  Medication Inspection Compliance: Pill count conducted under aseptic conditions, in front of the patient. Neither the pills nor the bottle was removed from the patient's sight at any time. Once count was completed pills were immediately returned to the patient in their original bottle.  Medication: Oxycodone IR Pill/Patch Count:  83 of 90 pills remain Pill/Patch Appearance: Markings consistent with prescribed medication Bottle Appearance: Standard pharmacy container. Clearly labeled. Filled Date: 54 / 27 / 2022 Last Medication intake:  Today Safety precautions to be maintained throughout the outpatient stay will include: orient to surroundings, keep bed in low position, maintain call bell within reach at all times, provide assistance with transfer out of bed and ambulation.

## 2021-01-01 NOTE — Patient Instructions (Signed)
____________________________________________________________________________________________  Medication Rules  Purpose: To inform patients, and their family members, of our rules and regulations.  Applies to: All patients receiving prescriptions (written or electronic).  Pharmacy of record: Pharmacy where electronic prescriptions will be sent. If written prescriptions are taken to a different pharmacy, please inform the nursing staff. The pharmacy listed in the electronic medical record should be the one where you would like electronic prescriptions to be sent.  Electronic prescriptions: In compliance with the [place] (STOP) Act of 2017 (Session Lanny Cramp [phone]), effective March 04, 2018, all controlled substances must be electronically prescribed. Calling prescriptions to the pharmacy will cease to exist.  Prescription refills: Only during scheduled appointments. Applies to all prescriptions.  NOTE: The following applies primarily to controlled substances (Opioid* Pain Medications).   Type of encounter (visit): For patients receiving controlled substances, face-to-face visits are required. (Not an option or up to the patient.)  Patient's responsibilities: Pain Pills: Bring all pain pills to every appointment (except for procedure appointments). Pill Bottles: Bring pills in original pharmacy bottle. Always bring the newest bottle. Bring bottle, even if empty. Medication refills: You are responsible for knowing and keeping track of what medications you take and those you need refilled. The day before your appointment: write a list of all prescriptions that need to be refilled. The day of the appointment: give the list to the admitting nurse. Prescriptions will be written only during appointments. No prescriptions will be written on procedure days. If you forget a medication: it will not be "Called in", "Faxed", or "electronically sent". You will  need to get another appointment to get these prescribed. No early refills. Do not call asking to have your prescription filled early. Prescription Accuracy: You are responsible for carefully inspecting your prescriptions before leaving our office. Have the discharge nurse carefully go over each prescription with you, before taking them home. Make sure that your name is accurately spelled, that your address is correct. Check the name and dose of your medication to make sure it is accurate. Check the number of pills, and the written instructions to make sure they are clear and accurate. Make sure that you are given enough medication to last until your next medication refill appointment. Taking Medication: Take medication as prescribed. When it comes to controlled substances, taking less pills or less frequently than prescribed is permitted and encouraged. Never take more pills than instructed. Never take medication more frequently than prescribed.  Inform other Doctors: Always inform, all of your healthcare providers, of all the medications you take. Pain Medication from other Providers: You are not allowed to accept any additional pain medication from any other Doctor or Healthcare provider. There are two exceptions to this rule. (see below) In the event that you require additional pain medication, you are responsible for notifying us, as stated below. Cough Medicine: Often these contain an opioid, such as codeine or hydrocodone. Never accept or take cough medicine containing these opioids if you are already taking an opioid* medication. The combination may cause respiratory failure and death. Medication Agreement: You are responsible for carefully reading and following our Medication Agreement. This must be signed before receiving any prescriptions from our practice. Safely store a copy of your signed Agreement. Violations to the Agreement will result in no further prescriptions. (Additional copies of our  Medication Agreement are available upon request.) Laws, Rules, & Regulations: All patients are expected to follow all Federal and [employer], [employer], Rules, [employer]. Ignorance of  the Laws does not constitute a valid excuse.  Illegal drugs and Controlled Substances: The use of illegal substances (including, but not limited to marijuana and its derivatives) and/or the illegal use of any controlled substances is strictly prohibited. Violation of this rule may result in the immediate and permanent discontinuation of any and all prescriptions being written by our practice. The use of any illegal substances is prohibited. Adopted CDC guidelines & recommendations: Target dosing levels will be at or below 60 MME/day. Use of benzodiazepines** is not recommended.  Exceptions: There are only two exceptions to the rule of not receiving pain medications from other Healthcare Providers. Exception #1 (Emergencies): In the event of an emergency (i.e.: accident requiring emergency care), you are allowed to receive additional pain medication. However, you are responsible for: As soon as you are able, call our office [phone], at any time of the day or night, and leave a message stating your name, the date and nature of the emergency, and the name and dose of the medication prescribed. In the event that your call is answered by a member of our staff, make sure to document and save the date, time, and the name of the person that took your information.  Exception #2 (Planned Surgery): In the event that you are scheduled by another doctor or dentist to have any type of surgery or procedure, you are allowed (for a period no longer than 30 days), to receive additional pain medication, for the acute post-op pain. However, in this case, you are responsible for picking up a copy of our "Post-op Pain Management for Surgeons" handout, and giving it to your surgeon or dentist. This document is available at our office, and  does not require an appointment to obtain it. Simply go to our office during business hours (Monday-Thursday from 8:00 AM to 4:00 PM) (Friday 8:00 AM to 12:00 Noon) or if you have a scheduled appointment with [place], prior to your surgery, and ask for it by name. In addition, you are responsible for: calling our office [phone], at any time of the day or night, and leaving a message stating your name, name of your surgeon, type of surgery, and date of procedure or surgery. Failure to comply with your responsibilities may result in termination of therapy involving the controlled substances. Medication Agreement Violation. Following the above rules, including your responsibilities will help you in avoiding a Medication Agreement Violation ("Breaking your Pain Medication Contract").  *Opioid medications include: morphine, codeine, oxycodone, oxymorphone, hydrocodone, hydromorphone, meperidine, tramadol, tapentadol, buprenorphine, fentanyl, methadone. **Benzodiazepine medications include: diazepam (Valium), alprazolam (Xanax), clonazepam (Klonopine), lorazepam (Ativan), clorazepate (Tranxene), chlordiazepoxide (Librium), estazolam (Prosom), oxazepam (Serax), temazepam (Restoril), triazolam (Halcion) (Last updated: 11/29/2020) ____________________________________________________________________________________________

## 2021-01-03 NOTE — Progress Notes (Signed)
I agree she has been exposed to gluten and would benefit from another visit with dietitian. I doubt that she has refractory disease.

## 2021-01-03 NOTE — Progress Notes (Signed)
Agree with dietary consultation again. Please make an appointment if patient is agreeable.

## 2021-01-10 ENCOUNTER — Encounter (HOSPITAL_COMMUNITY): Payer: Self-pay | Admitting: Hematology

## 2021-01-11 ENCOUNTER — Other Ambulatory Visit: Payer: Self-pay

## 2021-01-11 ENCOUNTER — Other Ambulatory Visit: Payer: Self-pay | Admitting: Internal Medicine

## 2021-01-11 ENCOUNTER — Encounter: Payer: Self-pay | Admitting: Internal Medicine

## 2021-01-11 ENCOUNTER — Ambulatory Visit (INDEPENDENT_AMBULATORY_CARE_PROVIDER_SITE_OTHER): Payer: Self-pay | Admitting: Internal Medicine

## 2021-01-11 VITALS — BP 98/42 | HR 63 | Resp 16 | Ht 60.0 in | Wt 176.0 lb

## 2021-01-11 DIAGNOSIS — R7303 Prediabetes: Secondary | ICD-10-CM

## 2021-01-11 DIAGNOSIS — Z2821 Immunization not carried out because of patient refusal: Secondary | ICD-10-CM

## 2021-01-11 DIAGNOSIS — F339 Major depressive disorder, recurrent, unspecified: Secondary | ICD-10-CM

## 2021-01-11 DIAGNOSIS — K900 Celiac disease: Secondary | ICD-10-CM

## 2021-01-11 DIAGNOSIS — M792 Neuralgia and neuritis, unspecified: Secondary | ICD-10-CM

## 2021-01-11 DIAGNOSIS — D649 Anemia, unspecified: Secondary | ICD-10-CM

## 2021-01-11 DIAGNOSIS — G894 Chronic pain syndrome: Secondary | ICD-10-CM

## 2021-01-11 DIAGNOSIS — I2510 Atherosclerotic heart disease of native coronary artery without angina pectoris: Secondary | ICD-10-CM

## 2021-01-11 DIAGNOSIS — E559 Vitamin D deficiency, unspecified: Secondary | ICD-10-CM

## 2021-01-11 DIAGNOSIS — M51369 Other intervertebral disc degeneration, lumbar region without mention of lumbar back pain or lower extremity pain: Secondary | ICD-10-CM

## 2021-01-11 DIAGNOSIS — K760 Fatty (change of) liver, not elsewhere classified: Secondary | ICD-10-CM

## 2021-01-11 DIAGNOSIS — Z1231 Encounter for screening mammogram for malignant neoplasm of breast: Secondary | ICD-10-CM

## 2021-01-11 DIAGNOSIS — M5136 Other intervertebral disc degeneration, lumbar region: Secondary | ICD-10-CM

## 2021-01-11 DIAGNOSIS — I10 Essential (primary) hypertension: Secondary | ICD-10-CM

## 2021-01-11 MED ORDER — TIZANIDINE HCL 4 MG PO TABS: 4.0000 mg | ORAL_TABLET | Freq: Three times a day (TID) | ORAL | 0 refills | Status: DC | PRN

## 2021-01-11 NOTE — Assessment & Plan Note (Signed)
History of IDA and B12 deficiency Check CBC with differential and vitamin B12 levels Used to get vitamin B12 injections

## 2021-01-11 NOTE — Assessment & Plan Note (Signed)
Chronic low back pain radiating to LE, due to lumbar facet arthropathy On chronic opioids, followed by pain clinic- broke pain contract and is on tapered dose of oxycodone currently Does not use marijuana now Referred to different pain clinic for second opinion

## 2021-01-11 NOTE — Assessment & Plan Note (Addendum)
History of STEMI s/p stent placement On aspirin and beta-blocker Not on statin due to history of transaminitis and NASH Followed by cardiology

## 2021-01-11 NOTE — Assessment & Plan Note (Signed)
Appears to be related to chronic medical conditions Has tried Zoloft, Cymbalta, Effexor -did not tolerate/did not have much benefit Does not want to take any antidepressant currently Referred to [facility] therapy

## 2021-01-11 NOTE — Assessment & Plan Note (Signed)
Has lumbar facet arthropathy Chronic low back pain, used to follow-up with [facility] -on tapered dose of oxycodone currently Has seen spine surgeon in the past Tizanidine as needed

## 2021-01-11 NOTE — Assessment & Plan Note (Signed)
Follows gluten-free diet, still has chronic diarrhea, followed by GI

## 2021-01-11 NOTE — Assessment & Plan Note (Signed)
On Lyrica 150 mg twice daily, was given by previous PCP Uncontrolled neuropathy pain evaluate Lyrica Advised to discuss with pain clinic

## 2021-01-11 NOTE — Assessment & Plan Note (Signed)
BP Readings from Last 1 Encounters:  01/11/21 (!) 98/42   Well-controlled with Metoprolol Mildly low due to recent vomiting and diarrhea, advised to avoid Torsemide which she takes PRN for leg swelling Counseled for compliance with the medications Advised DASH diet and moderate exercise/walking, at least 150 mins/week

## 2021-01-11 NOTE — Assessment & Plan Note (Signed)
History of NAFLD Followed by GI-Dr. Laural Golden

## 2021-01-11 NOTE — Patient Instructions (Signed)
Please get fasting blood tests done within a week.  You are being referred to pain clinic.

## 2021-01-11 NOTE — Progress Notes (Signed)
New Patient Office Visit  Subjective:  Patient ID: Sharon Lawrence, female    DOB: 07-24-1965  Age: 55 y.o. MRN: [id number]  CC:  Chief Complaint  Patient presents with   New Patient (Initial Visit)    New patient was being seen at [facility] she sees gastro she has stomach pain nausea diarrhea comes and goes she has been feeling very fatigued for the last year since feb her legs have been feeling heavy and numb neuropathy is worse than before also has lower back pain     HPI Sharon Lawrence is a 55 y.o. female with past medical history of HTN, CAD s/p stent placement, chronic pain syndrome due to lumbar arthropathy, celiac disease, NAFLD, recurrent depression, IDA, B12 deficiency and obesity who presents for establishing care.  HTN and CAD: Followed by cardiology.  She is on aspirin and metoprolol currently.  Statin was held due to transaminitis/NAFLD.  Denies any acute chest pain, dyspnea or palpitations.  Chronic pain syndrome: She has a history of lumbar facet arthropathy, for which she used to see [facility].  She is on chronic opioid medicines, but has been tapered off due to substance abuse with Marijuana and Methadone.  She requests a different pain clinic referral.  Celiac disease: She follows up with GI for it.  She continues to have chronic diarrhea, nausea and vomiting, which is intermittent.  She states that she has been trying to follow a gluten-free diet.  Anemia: She used to get iron infusions and vitamin B12 injections.  She complains of chronic fatigue, which could be due to her chronic pain and/or depression as well.  Denies any signs of active bleeding.  She has had 3 doses of COVID-vaccine.  Denied flu vaccine in the office today.  Past Medical History:  Diagnosis Date   Acute ST elevation myocardial infarction (STEMI) of inferior wall ([place]) 09/15/2017   ADHD (attention deficit hyperactivity disorder)    Anxiety    B12 deficiency 02/07/2011   Back pain     CAD (coronary artery disease)    a. s/p recent STEMI on 09/15/2017 with DES to RCA   Celiac disease    Folic acid deficiency 34/09/4257   GI bleed 11/06/2017   Headache    History of ST elevation myocardial infarction (STEMI) 11/06/2017   Iron deficiency anemia 02/07/2011   Myocardial infarction ([place]) 09/15/2017   Syncope and collapse 11/06/2017    Past Surgical History:  Procedure Laterality Date   BIOPSY  07/12/2020   Procedure: BIOPSY;  Surgeon: Rogene Houston, MD;  Location: [facility];  Service: Endoscopy;;  duodenum(2nd part);antral   CHOLECYSTECTOMY     COLONOSCOPY WITH PROPOFOL N/A 09/26/2017   rehman examined portion of ileum normal, - Five small, non-bleeding polyps in the rectum, in the sigmoid colon and at the hepatic   CORONARY STENT INTERVENTION Right 09/15/2017   Procedure: CORONARY STENT INTERVENTION;  Surgeon: Jettie Booze, MD;  Location: [facility];  Service: Cardiovascular;  Laterality: Right;  RCA   ESOPHAGOGASTRODUODENOSCOPY (EGD) WITH PROPOFOL N/A 07/12/2020   rehman: normal hypopharynx, Normal esophagus. z line regular 35 cm from incisors, congested and erythematous mucosa in antrum, mucosal changes in duodenum, very pronounced changes to suggest active celiac disease   KNEE CARTILAGE SURGERY Right    LEFT HEART CATH AND CORONARY ANGIOGRAPHY N/A 09/15/2017   Procedure: LEFT HEART CATH AND CORONARY ANGIOGRAPHY;  Surgeon: Jettie Booze, MD;  Location: [facility];  Service: Cardiovascular;  Laterality: N/A;   TONSILLECTOMY      Family History  Problem Relation Age of Onset   Cancer Mother    Hypertension Mother    Hypertension Father    Hypothyroidism Father    Cancer Sister    Anxiety disorder Sister    Depression Sister    CAD Maternal Grandfather    Cirrhosis Paternal Grandfather    CAD Paternal Grandmother    Diabetes Paternal Grandmother    Hypothyroidism Paternal Grandmother    Drug abuse Son    Bipolar disorder Son      Social History   Socioeconomic History   Marital status: Widowed    Spouse name: Not on file   Number of children: 3   Years of education: Not on file   Highest education level: Associate degree: occupational, Hotel manager, or vocational program  Occupational History   Not on file  Tobacco Use   Smoking status: Former    Packs/day: 0.25    Years: 12.00    Pack years: 3.00    Types: Cigarettes    Quit date: 04/04/2017    Years since quitting: 3.7   Smokeless tobacco: Never  Vaping Use   Vaping Use: Never used  Substance and Sexual Activity   Alcohol use: No   Drug use: No   Sexual activity: Not Currently  Other Topics Concern   Not on file  Social History Narrative   Not on file   Social Determinants of Health   Financial Resource Strain: Not on file  Food Insecurity: Not on file  Transportation Needs: Not on file  Physical Activity: Not on file  Stress: Not on file  Social Connections: Not on file  Intimate Partner Violence: Not on file    ROS Review of Systems  Constitutional:  Positive for fatigue. Negative for chills and fever.  HENT:  Negative for congestion, sinus pressure, sinus pain and sore throat.   Eyes:  Negative for pain and discharge.  Respiratory:  Negative for cough and shortness of breath.   Cardiovascular:  Negative for chest pain and palpitations.  Gastrointestinal:  Positive for diarrhea, nausea and vomiting.  Genitourinary:  Negative for dysuria and hematuria.  Musculoskeletal:  Positive for arthralgias, back pain, gait problem and neck pain.  Skin:  Negative for rash.  Neurological:  Positive for weakness and numbness. Negative for dizziness.  Psychiatric/Behavioral:  Positive for agitation, decreased concentration, dysphoric mood and sleep disturbance. Negative for behavioral problems. The patient is nervous/anxious.    Objective:   Today's Vitals: BP (!) 98/42 (BP Location: Left Arm)   Pulse 63   Resp 16   Ht 5' (1.524 m)   Wt 176  lb 0.6 oz (79.9 kg)   SpO2 100%   BMI 34.38 kg/m   Physical Exam Vitals reviewed.  Constitutional:      General: She is not in acute distress.    Appearance: She is obese. She is not diaphoretic.     Comments: Has a cane  HENT:     Head: Normocephalic and atraumatic.     Nose: Nose normal.     Mouth/Throat:     Mouth: Mucous membranes are moist.  Eyes:     General: No scleral icterus.    Extraocular Movements: Extraocular movements intact.  Cardiovascular:     Rate and Rhythm: Normal rate and regular rhythm.     Pulses: Normal pulses.     Heart sounds: Normal heart sounds. No murmur heard.  Pulmonary:     Breath sounds: Normal breath sounds. No wheezing or rales.  Abdominal:     Palpations: Abdomen is soft.     Tenderness: There is no abdominal tenderness.  Musculoskeletal:     Cervical back: Neck supple. No tenderness.     Right lower leg: No edema.     Left lower leg: No edema.  Skin:    General: Skin is warm.     Findings: No rash.  Neurological:     General: No focal deficit present.     Mental Status: She is alert and oriented to person, place, and time.     Sensory: No sensory deficit.     Motor: Weakness (B/l LE) present.  Psychiatric:        Mood and Affect: Mood is depressed.        Speech: Speech is delayed.        Behavior: Behavior is slowed.    Assessment & Plan:   Problem List Items Addressed This Visit       Cardiovascular and Mediastinum   Benign essential hypertension    BP Readings from Last 1 Encounters:  01/11/21 (!) 98/42  Well-controlled with Metoprolol Mildly low due to recent vomiting and diarrhea, advised to avoid Torsemide which she takes PRN for leg swelling Counseled for compliance with the medications Advised DASH diet and moderate exercise/walking, at least 150 mins/week       Coronary artery disease involving native coronary artery of native heart without angina pectoris    History of STEMI s/p stent placement On aspirin  and beta-blocker Not on statin due to history of transaminitis and NASH Followed by cardiology        Digestive   Fatty liver disease, nonalcoholic (Chronic)    History of NAFLD Followed by GI-Dr. Laural Golden      Relevant Orders   CMP14+EGFR   Lipid panel   Hemoglobin A1c   Celiac disease    Follows gluten-free diet, still has chronic diarrhea, followed by GI      Relevant Orders   CMP14+EGFR   Vitamin D (25 hydroxy)     Musculoskeletal and Integument   DDD (degenerative disc disease), lumbar (Chronic)    Has lumbar facet arthropathy Chronic low back pain, used to follow-up with [facility] -on tapered dose of oxycodone currently Has seen spine surgeon in the past Tizanidine as needed      Relevant Medications   tiZANidine (ZANAFLEX) 4 MG tablet     Other   Chronic pain syndrome - Primary (Chronic)    Chronic low back pain radiating to LE, due to lumbar facet arthropathy On chronic opioids, followed by pain clinic- broke pain contract and is on tapered dose of oxycodone currently Does not use marijuana now Referred to different pain clinic for second opinion      Relevant Medications   tiZANidine (ZANAFLEX) 4 MG tablet   Other Relevant Orders   Ambulatory referral to Pain Clinic   Neurogenic pain (Chronic)    On Lyrica 150 mg twice daily, was given by previous PCP Uncontrolled neuropathy pain evaluate Lyrica Advised to discuss with pain clinic      Influenza vaccination declined   Anemia    History of IDA and B12 deficiency Check CBC with differential and vitamin B12 levels Used to get vitamin B12 injections      Relevant Orders   CBC with Differential/Platelet   B12   Depression, recurrent ([place])    Appears to  be related to chronic medical conditions Has tried Zoloft, Cymbalta, Effexor -did not tolerate/did not have much benefit Does not want to take any antidepressant currently Referred to [facility] therapy      Relevant Orders   Ambulatory  referral to Psychiatry   CBC with Differential/Platelet   CMP14+EGFR   TSH + free T4   Other Visit Diagnoses     Vitamin D deficiency       Relevant Orders   Vitamin D (25 hydroxy)   Prediabetes       Relevant Orders   Hemoglobin A1c   Screening mammogram for breast cancer           Outpatient Encounter Medications as of 01/11/2021  Medication Sig   aspirin 81 MG chewable tablet Chew 1 tablet (81 mg total) by mouth daily.   famotidine (PEPCID) 40 MG tablet Take 1 tablet (40 mg total) by mouth at bedtime.   ketoconazole (NIZORAL) 2 % cream Apply topically 2 (two) times daily.   levonorgestrel (MIRENA) 20 MCG/24HR IUD by Intrauterine route.   loperamide (IMODIUM) 2 MG capsule Take 1 capsule (2 mg total) by mouth 3 (three) times daily as needed for diarrhea or loose stools.   metoCLOPramide (REGLAN) 10 MG tablet Take 1 tablet (10 mg total) by mouth every 8 (eight) hours as needed for nausea.   metoprolol succinate (TOPROL XL) 25 MG 24 hr tablet Take 1 tablet (25 mg total) by mouth daily.   nitroGLYCERIN (NITROLINGUAL) 0.4 MG/SPRAY spray Place 2 sprays under the tongue every 5 (five) minutes x 3 doses as needed.   nystatin (MYCOSTATIN/NYSTOP) powder Apply topically 3 (three) times daily as needed (yeast infection).   omeprazole (PRILOSEC) 40 MG capsule Take 1 capsule (40 mg total) by mouth daily.   ondansetron (ZOFRAN-ODT) 8 MG disintegrating tablet Take 1 tablet (8 mg total) by mouth 3 (three) times daily as needed for nausea.   [START ON 01/16/2021] oxyCODONE (OXY IR/ROXICODONE) 5 MG immediate release tablet Take 1 tablet (5 mg total) by mouth in the morning and at bedtime for 7 days, THEN 1 tablet (5 mg total) daily for 7 days.   potassium chloride (KLOR-CON) 10 MEQ tablet Take 10 meq daily when you take Torsemide   pregabalin (LYRICA) 150 MG capsule Take 150 mg by mouth 2 (two) times daily.   prochlorperazine (COMPAZINE) 10 MG tablet TAKE (1) TABLET BY MOUTH EVERY SIX HOURS AS  NEEDED NAUSEA OR VOMITING. (Patient taking differently: Take 10 mg by mouth every 6 (six) hours as needed for nausea or vomiting.)   rizatriptan (MAXALT) 10 MG tablet Take 10 mg by mouth as needed.   tiZANidine (ZANAFLEX) 4 MG tablet Take 1 tablet (4 mg total) by mouth every 8 (eight) hours as needed for muscle spasms.   torsemide (DEMADEX) 20 MG tablet Take 1 tablet (20 mg total) by mouth daily as needed (edema). As needed.   Vitamin D, Ergocalciferol, (DRISDOL) 1.25 MG (50000 UT) CAPS capsule Take 50,000 Units by mouth every 7 (seven) days. Wednesday   [DISCONTINUED] ibuprofen (ADVIL) 800 MG tablet Take 1 tablet (800 mg total) by mouth every 8 (eight) hours as needed for mild pain, moderate pain, fever, cramping or headache.   No facility-administered encounter medications on file as of 01/11/2021.    Follow-up: Return in about 3 months (around 04/13/2021) for Depression and chronic pain.   Lindell Spar, MD

## 2021-01-24 ENCOUNTER — Ambulatory Visit (HOSPITAL_COMMUNITY): Payer: Medicaid Other

## 2021-02-08 ENCOUNTER — Ambulatory Visit: Payer: Self-pay | Admitting: Internal Medicine

## 2021-04-03 ENCOUNTER — Ambulatory Visit (INDEPENDENT_AMBULATORY_CARE_PROVIDER_SITE_OTHER): Payer: Self-pay | Admitting: Internal Medicine

## 2021-04-05 ENCOUNTER — Other Ambulatory Visit: Payer: Self-pay

## 2021-04-05 ENCOUNTER — Encounter: Payer: Self-pay | Admitting: Cardiology

## 2021-04-05 ENCOUNTER — Ambulatory Visit (INDEPENDENT_AMBULATORY_CARE_PROVIDER_SITE_OTHER): Payer: Self-pay | Admitting: Cardiology

## 2021-04-05 VITALS — BP 108/60 | HR 104 | Ht 60.0 in | Wt 181.4 lb

## 2021-04-05 DIAGNOSIS — R600 Localized edema: Secondary | ICD-10-CM

## 2021-04-05 DIAGNOSIS — I25118 Atherosclerotic heart disease of native coronary artery with other forms of angina pectoris: Secondary | ICD-10-CM

## 2021-04-05 DIAGNOSIS — R002 Palpitations: Secondary | ICD-10-CM

## 2021-04-05 NOTE — Patient Instructions (Signed)
Follow-Up: Follow up in 6 months with Dr. Harl Bowie  Any Other Special Instructions Will Be Listed Below (If Applicable).     If you need a refill on your cardiac medications before your next appointment, please call your pharmacy.

## 2021-04-05 NOTE — Progress Notes (Signed)
Clinical Summary [name] is a 56 y.o.female seen today for follow up of the following medical problems.    1. CAD - 09/2017 admitted with STEMI, receved PCI to RCA. LVgram 55-65%.  - brillinta changed to plavix due to hematochezia. Lopressor stopped at that time due to soft bp's but was restarted at f/u. Has not been on ACE-I   -  Soft bp's limit medical therapy.    01/2020 echo: LVEF 65-70%, no WMAs, normal diastolic 08/2692 lexiscan: no ischemia  - heaviness upper midchest to right side at times, nonexertional     2. LE edema - long history, taking torsemide regularly - if misses torsemide swelling can become severe, can have some SOB/DOE   - has torsemide prn, roughly takes every other day. Can be difficult to take sometimes, has had some leg weakness and walking back and forth to bathroom is difficult.    3.History of  Hematochezia - admission 09/2017  - brillinta was changed to plavix during that admission, no recurrent issues.      4. Hyperlipidemia - limiting statin dosing due to previous elevated LFTs    5. Palpitations -  - lopressor 18m bid caused fatigue - chagned to toprol 214mdaily  - no recent symptoms        Past Medical History:  Diagnosis Date   Acute ST elevation myocardial [place] ([place] inferior [place]   ADHD (attention deficit hyperactivity disorder)    Anxiety    B12 deficiency 02/07/2011   Back pain    CAD (coronary artery disease)    a. s/p recent STEMI on 09/15/2017 with DES to RCA   Celiac disease    Folic acid deficiency 1285/06/6268 GI bleed 11/06/2017   Headache    History of ST elevation myocardial infarction (STEMI) 11/06/2017   Iron deficiency anemia 02/07/2011   [place]   Syncope and collapse 11/06/2017     Allergies  Allergen Reactions   Bee Venom Anaphylaxis   Erythromycin Anaphylaxis   Cephalexin    Neomycin-Bacitracin-Polymyxin  [Bacitracin-Neomycin-Polymyxin]  Swelling    Sight of application   Neosporin Original [Bacitracin-Neomycin-Polymyxin] Swelling    Swelling at site of application   Prednisone    Sulfa Antibiotics Swelling    Sulfa eye drops caused eyes to swell Sulfa eye drops caused eyes to swell   Tobrex [Tobramycin] Swelling   Tramadol Hives   Penicillins Rash    Has patient had a PCN reaction causing immediate rash, facial/tongue/throat swelling, SOB or lightheadedness with hypotension: Yes Has patient had a PCN reaction causing severe rash involving mucus membranes or skin necrosis: No Has patient had a PCN reaction that required hospitalization: No Has patient had a PCN reaction occurring within the last 10 years: No If all of the above answers are "NO", then may proceed with Cephalosporin use.      Current Outpatient Medications  Medication Sig Dispense Refill   aspirin 81 MG chewable tablet Chew 1 tablet (81 mg total) by mouth daily.     famotidine (PEPCID) 40 MG tablet Take 1 tablet (40 mg total) by mouth at bedtime. 90 tablet 1   ketoconazole (NIZORAL) 2 % cream Apply topically 2 (two) times daily. 60 g 1   levonorgestrel (MIRENA) 20 MCG/24HR IUD by Intrauterine route.     loperamide (IMODIUM) 2 MG capsule Take 1 capsule (2 mg total) by mouth 3 (three) times daily as needed for diarrhea or loose stools. 15  capsule 0   metoCLOPramide (REGLAN) 10 MG tablet Take 1 tablet (10 mg total) by mouth every 8 (eight) hours as needed for nausea. 90 tablet 2   metoprolol succinate (TOPROL XL) 25 MG 24 hr tablet Take 1 tablet (25 mg total) by mouth daily. 90 tablet 3   nitroGLYCERIN (NITROLINGUAL) 0.4 MG/SPRAY spray Place 2 sprays under the tongue every 5 (five) minutes x 3 doses as needed.     nystatin (MYCOSTATIN/NYSTOP) powder Apply topically 3 (three) times daily as needed (yeast infection). 15 g 1   omeprazole (PRILOSEC) 40 MG capsule Take 1 capsule (40 mg total) by mouth daily. 90 capsule 3   ondansetron (ZOFRAN-ODT) 8 MG  disintegrating tablet Take 1 tablet (8 mg total) by mouth 3 (three) times daily as needed for nausea. 20 tablet 3   oxyCODONE (OXY IR/ROXICODONE) 5 MG immediate release tablet Take 1 tablet (5 mg total) by mouth in the morning and at bedtime for 7 days, THEN 1 tablet (5 mg total) daily for 7 days. 21 tablet 0   potassium chloride (KLOR-CON) 10 MEQ tablet Take 10 meq daily when you take Torsemide 90 tablet 3   pregabalin (LYRICA) 150 MG capsule Take 150 mg by mouth 2 (two) times daily.     prochlorperazine (COMPAZINE) 10 MG tablet TAKE (1) TABLET BY MOUTH EVERY SIX HOURS AS NEEDED NAUSEA OR VOMITING. (Patient taking differently: Take 10 mg by mouth every 6 (six) hours as needed for nausea or vomiting.) 30 tablet 1   rizatriptan (MAXALT) 10 MG tablet Take 10 mg by mouth as needed.     tiZANidine (ZANAFLEX) 4 MG tablet Take 1 tablet (4 mg total) by mouth every 8 (eight) hours as needed for muscle spasms. 30 tablet 0   torsemide (DEMADEX) 20 MG tablet Take 1 tablet (20 mg total) by mouth daily as needed (edema). As needed.  2   Vitamin D, Ergocalciferol, (DRISDOL) 1.25 MG (50000 UT) CAPS capsule Take 50,000 Units by mouth every 7 (seven) days. Wednesday     No current facility-administered medications for this visit.     Past Surgical History:  Procedure Laterality Date   BIOPSY  07/12/2020   Procedure: BIOPSY;  Surgeon: Rogene Houston, MD;  Location: [facility];  Service: Endoscopy;;  duodenum(2nd part);antral   CHOLECYSTECTOMY     COLONOSCOPY WITH PROPOFOL N/A 09/26/2017   rehman examined portion of ileum normal, - Five small, non-bleeding polyps in the rectum, in the sigmoid colon and at the hepatic   CORONARY STENT INTERVENTION Right 09/15/2017   Procedure: CORONARY STENT INTERVENTION;  Surgeon: Jettie Booze, MD;  Location: [facility];  Service: Cardiovascular;  Laterality: Right;  RCA   ESOPHAGOGASTRODUODENOSCOPY (EGD) WITH PROPOFOL N/A 07/12/2020   rehman: normal  hypopharynx, Normal esophagus. z line regular 35 cm from incisors, congested and erythematous mucosa in antrum, mucosal changes in duodenum, very pronounced changes to suggest active celiac disease   KNEE CARTILAGE SURGERY Right    LEFT HEART CATH AND CORONARY ANGIOGRAPHY N/A 09/15/2017   Procedure: LEFT HEART CATH AND CORONARY ANGIOGRAPHY;  Surgeon: Jettie Booze, MD;  Location: [facility];  Service: Cardiovascular;  Laterality: N/A;   TONSILLECTOMY       Allergies  Allergen Reactions   Bee Venom Anaphylaxis   Erythromycin Anaphylaxis   Cephalexin    Neomycin-Bacitracin-Polymyxin  [Bacitracin-Neomycin-Polymyxin] Swelling    Sight of application   Neosporin Original [Bacitracin-Neomycin-Polymyxin] Swelling    Swelling at site of application  Prednisone    Sulfa Antibiotics Swelling    Sulfa eye drops caused eyes to swell Sulfa eye drops caused eyes to swell   Tobrex [Tobramycin] Swelling   Tramadol Hives   Penicillins Rash    Has patient had a PCN reaction causing immediate rash, facial/tongue/throat swelling, SOB or lightheadedness with hypotension: Yes Has patient had a PCN reaction causing severe rash involving mucus membranes or skin necrosis: No Has patient had a PCN reaction that required hospitalization: No Has patient had a PCN reaction occurring within the last 10 years: No If all of the above answers are "NO", then may proceed with Cephalosporin use.       Family History  Problem Relation Age of Onset   Cancer Mother    Hypertension Mother    Hypertension Father    Hypothyroidism Father    Cancer Sister    Anxiety disorder Sister    Depression Sister    CAD Maternal Grandfather    Cirrhosis Paternal Grandfather    CAD Paternal Grandmother    Diabetes Paternal Grandmother    Hypothyroidism Paternal Grandmother    Drug abuse Son    Bipolar disorder Son      Social History Ms. Reidel reports that she quit smoking about 4 years ago. Her  smoking use included cigarettes. She has a 3.00 pack-year smoking history. She has never used smokeless tobacco. Ms. Gammon reports no history of alcohol use.   Review of Systems CONSTITUTIONAL: No weight loss, fever, chills, weakness or fatigue.  HEENT: Eyes: No visual loss, blurred vision, double vision or yellow sclerae.No hearing loss, sneezing, congestion, runny nose or sore throat.  SKIN: No rash or itching.  CARDIOVASCULAR: pe rhpi RESPIRATORY: No shortness of breath, cough or sputum.  GASTROINTESTINAL: No anorexia, nausea, vomiting or diarrhea. No abdominal pain or blood.  GENITOURINARY: No burning on urination, no polyuria NEUROLOGICAL: No headache, dizziness, syncope, paralysis, ataxia, numbness or tingling in the extremities. No change in bowel or bladder control.  MUSCULOSKELETAL: No muscle, back pain, joint pain or stiffness.  LYMPHATICS: No enlarged nodes. No history of splenectomy.  PSYCHIATRIC: No history of depression or anxiety.  ENDOCRINOLOGIC: No reports of sweating, cold or heat intolerance. No polyuria or polydipsia.  Marland Kitchen   Physical Examination Today's Vitals   04/05/21 1429  BP: 108/60  Pulse: (!) 104  SpO2: 100%  Weight: 181 lb 6.4 oz (82.3 kg)  Height: 5' (1.524 m)   Body mass index is 35.43 kg/m.  Gen: resting comfortably, no acute distress HEENT: no scleral icterus, pupils equal round and reactive, no palptable cervical adenopathy,  CV: RRR, no m/r/ gno jvd Resp: Clear to auscultation bilaterally GI: abdomen is soft, non-tender, non-distended, normal bowel sounds, no hepatosplenomegaly MSK: extremities are warm,2+ L and 1+ right sided edema Skin: warm, no rash Neuro:  leg weakness Psych: appropriate affect   Diagnostic Studies   09/2017 cath Prox RCA lesion is 80% stenosed. It appeared to be a ruptured plaque. A drug-eluting stent was successfully placed using a STENT SYNERGY DES 4X24. Post intervention, there is a 0% residual stenosis. The left  ventricular systolic function is normal. LV end diastolic pressure is low. The left ventricular ejection fraction is 55-65% by visual estimate. Severe spasm in the right radial artery, requiring heavy sedation and IA NTG. 4Fr diagnostic and 5 Fr Guide catheter used.   01/2020 echo IMPRESSIONS     1. Left ventricular ejection fraction, by estimation, is 65 to 70%. The  left ventricle has  normal function. The left ventricle has no regional  wall motion abnormalities. There is mild left ventricular hypertrophy.  Left ventricular diastolic parameters  were normal.   2. Right ventricular systolic function is normal. The right ventricular  size is normal.   3. The mitral valve is normal in structure. No evidence of mitral valve  regurgitation. No evidence of mitral stenosis.   4. The aortic valve is tricuspid. Aortic valve regurgitation is not  visualized. No aortic stenosis is present.   5. The inferior vena cava is normal in size with greater than 50%  respiratory variability, suggesting right atrial pressure of 3 mmHg.    06/2020 nuclear stress There was no ST segment deviation noted during stress. The study is normal. There are no perfusion defects consistent with prior infarct or current ischemia This is a low risk study. The left ventricular ejection fraction is hyperdynamic (>65%).   Assessment and Plan  1. CAD with other forms of angina -recent chest pain is atypical - recent nuclear stress test without significant ischemia - continue current meds - has not been on statin due to elevated LFTs, fatty liver     2. LE edema - ongoing edema, encouraged takign diuretic more regularly - echo was overall benign   3.Palpitations - doing well on toprol 25, continue current meds     Arnoldo Lenis, M.D.

## 2021-04-12 LAB — CMP14+EGFR
ALT: 12 IU/L (ref 0–32)
AST: 55 IU/L — ABNORMAL HIGH (ref 0–40)
Albumin/Globulin Ratio: 0.8 — ABNORMAL LOW (ref 1.2–2.2)
Albumin: 2.7 g/dL — ABNORMAL LOW (ref 3.8–4.9)
Alkaline Phosphatase: 166 IU/L — ABNORMAL HIGH (ref 44–121)
BUN/Creatinine Ratio: 18 (ref 9–23)
BUN: 13 mg/dL (ref 6–24)
Bilirubin Total: 6 mg/dL — ABNORMAL HIGH (ref 0.0–1.2)
CO2: 20 mmol/L (ref 20–29)
Calcium: 8.6 mg/dL — ABNORMAL LOW (ref 8.7–10.2)
Chloride: 90 mmol/L — ABNORMAL LOW (ref 96–106)
Creatinine, Ser: 0.71 mg/dL (ref 0.57–1.00)
Globulin, Total: 3.2 g/dL (ref 1.5–4.5)
Glucose: 68 mg/dL — ABNORMAL LOW (ref 70–99)
Potassium: 3.7 mmol/L (ref 3.5–5.2)
Sodium: 132 mmol/L — ABNORMAL LOW (ref 134–144)
Total Protein: 5.9 g/dL — ABNORMAL LOW (ref 6.0–8.5)
eGFR: 102 mL/min/{1.73_m2} (ref >59)

## 2021-04-12 LAB — CBC WITH DIFFERENTIAL/PLATELET
Basophils Absolute: 0 10*3/uL (ref 0.0–0.2)
Basos: 0 %
EOS (ABSOLUTE): 0 10*3/uL (ref 0.0–0.4)
Eos: 0 %
Hematocrit: 25.1 % — ABNORMAL LOW (ref 34.0–46.6)
Hemoglobin: 9.2 g/dL — ABNORMAL LOW (ref 11.1–15.9)
Immature Grans (Abs): 0 10*3/uL (ref 0.0–0.1)
Immature Granulocytes: 0 %
Lymphocytes Absolute: 2.5 10*3/uL (ref 0.7–3.1)
Lymphs: 22 %
MCH: 28 pg (ref 26.6–33.0)
MCHC: 36.7 g/dL — ABNORMAL HIGH (ref 31.5–35.7)
MCV: 76 fL — ABNORMAL LOW (ref 79–97)
Monocytes Absolute: 0.8 10*3/uL (ref 0.1–0.9)
Monocytes: 7 %
Neutrophils Absolute: 8.1 10*3/uL — ABNORMAL HIGH (ref 1.4–7.0)
Neutrophils: 71 %
Platelets: 244 10*3/uL (ref 150–450)
RBC: 3.29 x10E6/uL — ABNORMAL LOW (ref 3.77–5.28)
RDW: 16.7 % — ABNORMAL HIGH (ref 11.7–15.4)
WBC: 11.5 10*3/uL — ABNORMAL HIGH (ref 3.4–10.8)

## 2021-04-12 LAB — TSH+FREE T4
Free T4: 1.42 ng/dL (ref 0.82–1.77)
TSH: 5.06 u[IU]/mL — ABNORMAL HIGH (ref 0.450–4.500)

## 2021-04-12 LAB — VITAMIN B12: Vitamin B-12: >2000 pg/mL — ABNORMAL HIGH (ref 232–1245)

## 2021-04-12 LAB — LIPID PANEL
Chol/HDL Ratio: 4.5 ratio — ABNORMAL HIGH (ref 0.0–4.4)
Cholesterol, Total: 103 mg/dL (ref 100–199)
HDL: 23 mg/dL — ABNORMAL LOW (ref >39)
LDL Chol Calc (NIH): 65 mg/dL (ref 0–99)
Triglycerides: 69 mg/dL (ref 0–149)
VLDL Cholesterol Cal: 15 mg/dL (ref 5–40)

## 2021-04-12 LAB — HEMOGLOBIN A1C

## 2021-04-12 LAB — VITAMIN D 25 HYDROXY (VIT D DEFICIENCY, FRACTURES): Vit D, 25-Hydroxy: 34.2 ng/mL (ref 30.0–100.0)

## 2021-04-13 ENCOUNTER — Encounter: Payer: Self-pay | Admitting: Internal Medicine

## 2021-04-13 ENCOUNTER — Ambulatory Visit (INDEPENDENT_AMBULATORY_CARE_PROVIDER_SITE_OTHER): Payer: Self-pay | Admitting: Internal Medicine

## 2021-04-13 ENCOUNTER — Other Ambulatory Visit: Payer: Self-pay

## 2021-04-13 VITALS — BP 98/54 | HR 78 | Resp 18 | Ht 60.0 in

## 2021-04-13 DIAGNOSIS — E8809 Other disorders of plasma-protein metabolism, not elsewhere classified: Secondary | ICD-10-CM

## 2021-04-13 DIAGNOSIS — E038 Other specified hypothyroidism: Secondary | ICD-10-CM

## 2021-04-13 DIAGNOSIS — Z2821 Immunization not carried out because of patient refusal: Secondary | ICD-10-CM

## 2021-04-13 DIAGNOSIS — M792 Neuralgia and neuritis, unspecified: Secondary | ICD-10-CM

## 2021-04-13 DIAGNOSIS — G894 Chronic pain syndrome: Secondary | ICD-10-CM

## 2021-04-13 DIAGNOSIS — E46 Unspecified protein-calorie malnutrition: Secondary | ICD-10-CM

## 2021-04-13 DIAGNOSIS — I10 Essential (primary) hypertension: Secondary | ICD-10-CM

## 2021-04-13 DIAGNOSIS — F339 Major depressive disorder, recurrent, unspecified: Secondary | ICD-10-CM

## 2021-04-13 MED ORDER — PREGABALIN 150 MG PO CAPS: 150.0000 mg | ORAL_CAPSULE | Freq: Two times a day (BID) | ORAL | 5 refills | Status: DC

## 2021-04-13 NOTE — Assessment & Plan Note (Signed)
Appears to be related to chronic medical conditions Has tried Zoloft, Cymbalta, Effexor -did not tolerate/did not have much benefit Does not want to take any antidepressant currently Referred to [facility] therapy 

## 2021-04-13 NOTE — Assessment & Plan Note (Signed)
BP Readings from Last 1 Encounters:  04/13/21 (!) 98/54   Well-controlled with Metoprolol Mildly low due to recent vomiting and diarrhea, advised to avoid Torsemide which she takes PRN for leg swelling Counseled for compliance with the medications Advised DASH diet

## 2021-04-13 NOTE — Assessment & Plan Note (Signed)
Lab Results  Component Value Date   TSH 5.060 (H) 04/10/2021   Due to chronic, diffuse pain/inflammation Will recheck TSH and free T4

## 2021-04-13 NOTE — Assessment & Plan Note (Signed)
Chronic low back pain radiating to LE, due to lumbar facet arthropathy On chronic opioids, followed by pain clinic- broke pain contract and is on tapered dose of oxycodone currently Does not use marijuana now Referred to different pain clinic for second opinion 

## 2021-04-13 NOTE — Assessment & Plan Note (Signed)
Due to poor PO intake Ensure supplement samples given, advised to continue for now

## 2021-04-13 NOTE — Assessment & Plan Note (Signed)
On Lyrica 150 mg twice daily, was given by previous PCP Uncontrolled neuropathy pain as she had run out of it, refilled for now Referred to Neurology

## 2021-04-13 NOTE — Patient Instructions (Addendum)
Please start taking Lyrica as prescribed.  You are being referred to Neurology.  Please increase fluid intake and eat at regular intervals.  Please get fasting blood tests done after 6 weeks.

## 2021-04-13 NOTE — Progress Notes (Signed)
Established Patient Office Visit  Subjective:  Patient ID: Sharon Lawrence, female    DOB: 12/03/1965  Age: 56 y.o. MRN: [id number]  CC:  Chief Complaint  Patient presents with   Follow-up    3 month follow up pt is no better feels worse she can no longer walk numbness tingling back and calves is still going on weak and unsteady not eating or drinking and still vomiting     HPI Sharon Lawrence is a 56 y.o. female with past medical history of HTN, CAD s/p stent placement, chronic pain syndrome due to lumbar arthropathy, celiac disease, NAFLD, recurrent depression, IDA, B12 deficiency and obesity who presents for f/u of her chronic medical conditions.  Neuropathy and Chronic pain syndrome: She has a history of lumbar facet arthropathy, for which she used to see [place] pain clinic.  She is on chronic opioid medicines, but has been tapered off due to substance abuse with Marijuana and Methadone.  She requests a different pain clinic referral.  She was referred to [facility] pain clinic in [place], but she requests referral to [facility] neurology for neuropathy.  She has run out of her Lyrica, which is refilled today.  Celiac disease: She follows up with GI for it.  She continues to have chronic diarrhea, nausea and vomiting, which is intermittent.  She states that she has been trying to follow a gluten-free diet.  She continues to complain of nausea and vomiting despite taking Zofran and Phenergan as needed.  Her last BMP showed signs of dehydration and hypoalbuminemia.  She agrees to take protein supplement orally.  HTN and CAD: Followed by cardiology.  She is on aspirin and metoprolol currently.  Statin was held due to transaminitis/NAFLD.  Denies any acute chest pain, dyspnea or palpitations.  Past Medical History:  Diagnosis Date   Acute ST elevation myocardial infarction (STEMI) of inferior wall ([place]) 09/15/2017   ADHD (attention deficit hyperactivity disorder)    Anxiety    B12 deficiency 02/07/2011    Back pain    CAD (coronary artery disease)    a. s/p recent STEMI on 09/15/2017 with DES to RCA   Celiac disease    Folic acid deficiency 51/09/6158   GI bleed 11/06/2017   Headache    History of ST elevation myocardial infarction (STEMI) 11/06/2017   Iron deficiency anemia 02/07/2011   Myocardial infarction ([place]) 09/15/2017   Syncope and collapse 11/06/2017    Past Surgical History:  Procedure Laterality Date   BIOPSY  07/12/2020   Procedure: BIOPSY;  Surgeon: Rogene Houston, MD;  Location: [facility];  Service: Endoscopy;;  duodenum(2nd part);antral   CHOLECYSTECTOMY     COLONOSCOPY WITH PROPOFOL N/A 09/26/2017   rehman examined portion of ileum normal, - Five small, non-bleeding polyps in the rectum, in the sigmoid colon and at the hepatic   CORONARY STENT INTERVENTION Right 09/15/2017   Procedure: CORONARY STENT INTERVENTION;  Surgeon: Jettie Booze, MD;  Location: [place] [facility];  Service: Cardiovascular;  Laterality: Right;  RCA   ESOPHAGOGASTRODUODENOSCOPY (EGD) WITH PROPOFOL N/A 07/12/2020   rehman: normal hypopharynx, Normal esophagus. z line regular 35 cm from incisors, congested and erythematous mucosa in antrum, mucosal changes in duodenum, very pronounced changes to suggest active celiac disease   KNEE CARTILAGE SURGERY Right    LEFT HEART CATH AND CORONARY ANGIOGRAPHY N/A 09/15/2017   Procedure: LEFT HEART CATH AND CORONARY ANGIOGRAPHY;  Surgeon: Jettie Booze, MD;  Location: [place] [facility];  Service: Cardiovascular;  Laterality: N/A;   TONSILLECTOMY      Family History  Problem Relation Age of Onset   Cancer Mother    Hypertension Mother    Hypertension Father    Hypothyroidism Father    Cancer Sister    Anxiety disorder Sister    Depression Sister    CAD Maternal Grandfather    Cirrhosis Paternal Grandfather    CAD Paternal Grandmother    Diabetes Paternal Grandmother    Hypothyroidism Paternal Grandmother    Drug abuse Son    Bipolar  disorder Son     Social History   Socioeconomic History   Marital status: Widowed    Spouse name: Not on file   Number of children: 3   Years of education: Not on file   Highest education level: Associate degree: occupational, Hotel manager, or vocational program  Occupational History   Not on file  Tobacco Use   Smoking status: Former    Packs/day: 0.25    Years: 12.00    Pack years: 3.00    Types: Cigarettes    Quit date: 04/04/2017    Years since quitting: 4.0   Smokeless tobacco: Never  Vaping Use   Vaping Use: Never used  Substance and Sexual Activity   Alcohol use: No   Drug use: No   Sexual activity: Not Currently  Other Topics Concern   Not on file  Social History Narrative   Not on file   Social Determinants of Health   Financial Resource Strain: Not on file  Food Insecurity: Not on file  Transportation Needs: Not on file  Physical Activity: Not on file  Stress: Not on file  Social Connections: Not on file  Intimate Partner Violence: Not on file    Outpatient Medications Prior to Visit  Medication Sig Dispense Refill   aspirin 81 MG chewable tablet Chew 1 tablet (81 mg total) by mouth daily.     famotidine (PEPCID) 40 MG tablet Take 1 tablet (40 mg total) by mouth at bedtime. 90 tablet 1   ketoconazole (NIZORAL) 2 % cream Apply topically 2 (two) times daily. 60 g 1   levonorgestrel (MIRENA) 20 MCG/24HR IUD by Intrauterine route.     loperamide (IMODIUM) 2 MG capsule Take 1 capsule (2 mg total) by mouth 3 (three) times daily as needed for diarrhea or loose stools. 15 capsule 0   metoCLOPramide (REGLAN) 10 MG tablet Take 1 tablet (10 mg total) by mouth every 8 (eight) hours as needed for nausea. 90 tablet 2   metoprolol succinate (TOPROL XL) 25 MG 24 hr tablet Take 1 tablet (25 mg total) by mouth daily. 90 tablet 3   nitroGLYCERIN (NITROLINGUAL) 0.4 MG/SPRAY spray Place 2 sprays under the tongue every 5 (five) minutes x 3 doses as needed.     nystatin  (MYCOSTATIN/NYSTOP) powder Apply topically 3 (three) times daily as needed (yeast infection). 15 g 1   omeprazole (PRILOSEC) 40 MG capsule Take 1 capsule (40 mg total) by mouth daily. 90 capsule 3   ondansetron (ZOFRAN-ODT) 8 MG disintegrating tablet Take 1 tablet (8 mg total) by mouth 3 (three) times daily as needed for nausea. 20 tablet 3   potassium chloride (KLOR-CON) 10 MEQ tablet Take 10 meq daily when you take Torsemide 90 tablet 3   prochlorperazine (COMPAZINE) 10 MG tablet TAKE (1) TABLET BY MOUTH EVERY SIX HOURS AS NEEDED NAUSEA OR VOMITING. (Patient taking differently: Take 10 mg by mouth every 6 (six) hours as needed  for nausea or vomiting.) 30 tablet 1   rizatriptan (MAXALT) 10 MG tablet Take 10 mg by mouth as needed.     tiZANidine (ZANAFLEX) 4 MG tablet Take 1 tablet (4 mg total) by mouth every 8 (eight) hours as needed for muscle spasms. 30 tablet 0   torsemide (DEMADEX) 20 MG tablet Take 1 tablet (20 mg total) by mouth daily as needed (edema). As needed.  2   Vitamin D, Ergocalciferol, (DRISDOL) 1.25 MG (50000 UT) CAPS capsule Take 50,000 Units by mouth every 7 (seven) days. Wednesday     pregabalin (LYRICA) 150 MG capsule Take 150 mg by mouth 2 (two) times daily.     oxyCODONE (OXY IR/ROXICODONE) 5 MG immediate release tablet Take 1 tablet (5 mg total) by mouth in the morning and at bedtime for 7 days, THEN 1 tablet (5 mg total) daily for 7 days. (Patient not taking: Reported on 04/05/2021) 21 tablet 0   No facility-administered medications prior to visit.    Allergies  Allergen Reactions   Bee Venom Anaphylaxis   Erythromycin Anaphylaxis   Cephalexin    Neomycin-Bacitracin-Polymyxin  [Bacitracin-Neomycin-Polymyxin] Swelling    Sight of application   Neosporin Original [Bacitracin-Neomycin-Polymyxin] Swelling    Swelling at site of application   Prednisone    Sulfa Antibiotics Swelling    Sulfa eye drops caused eyes to swell Sulfa eye drops caused eyes to swell   Tobrex  [Tobramycin] Swelling   Tramadol Hives   Penicillins Rash    Has patient had a PCN reaction causing immediate rash, facial/tongue/throat swelling, SOB or lightheadedness with hypotension: Yes Has patient had a PCN reaction causing severe rash involving mucus membranes or skin necrosis: No Has patient had a PCN reaction that required hospitalization: No Has patient had a PCN reaction occurring within the last 10 years: No If all of the above answers are "NO", then may proceed with Cephalosporin use.     ROS Review of Systems  Constitutional:  Positive for fatigue. Negative for chills and fever.  HENT:  Negative for congestion, sinus pressure, sinus pain and sore throat.   Eyes:  Negative for pain and discharge.  Respiratory:  Negative for cough and shortness of breath.   Cardiovascular:  Positive for leg swelling. Negative for chest pain and palpitations.  Gastrointestinal:  Positive for diarrhea, nausea and vomiting.  Genitourinary:  Negative for dysuria and hematuria.  Musculoskeletal:  Positive for arthralgias, back pain, gait problem and neck pain.  Skin:  Negative for rash.  Neurological:  Positive for weakness and numbness. Negative for dizziness.  Psychiatric/Behavioral:  Positive for agitation, decreased concentration, dysphoric mood and sleep disturbance. Negative for behavioral problems. The patient is nervous/anxious.      Objective:    Physical Exam Vitals reviewed.  Constitutional:      General: She is not in acute distress.    Appearance: She is obese. She is ill-appearing. She is not diaphoretic.     Comments: In wheelchair  HENT:     Head: Normocephalic and atraumatic.     Nose: Nose normal.     Mouth/Throat:     Mouth: Mucous membranes are moist.  Eyes:     General: No scleral icterus.    Extraocular Movements: Extraocular movements intact.  Cardiovascular:     Rate and Rhythm: Normal rate and regular rhythm.     Pulses: Normal pulses.     Heart sounds:  Normal heart sounds. No murmur heard. Pulmonary:     Breath sounds: Normal  breath sounds. No wheezing or rales.  Abdominal:     Palpations: Abdomen is soft.     Tenderness: There is no abdominal tenderness.  Musculoskeletal:     Cervical back: Neck supple. No tenderness.     Right lower leg: Edema present.     Left lower leg: Edema present.  Skin:    General: Skin is warm.     Findings: No rash.  Neurological:     General: No focal deficit present.     Mental Status: She is alert and oriented to person, place, and time.     Sensory: No sensory deficit.     Motor: Weakness (B/l LE - 1/5 and UE - 2/5) present.  Psychiatric:        Mood and Affect: Mood is depressed.        Speech: Speech is delayed.        Behavior: Behavior is slowed.    BP (!) 98/54 (BP Location: Left Arm, Patient Position: Sitting, Cuff Size: Normal)    Pulse 78    Resp 18    Ht 5' (1.524 m)    SpO2 100%    BMI 35.43 kg/m  Wt Readings from Last 3 Encounters:  04/05/21 181 lb 6.4 oz (82.3 kg)  01/11/21 176 lb 0.6 oz (79.9 kg)  01/01/21 181 lb (82.1 kg)    Lab Results  Component Value Date   TSH 5.060 (H) 04/10/2021   Lab Results  Component Value Date   WBC 11.5 (H) 04/10/2021   HGB 9.2 (L) 04/10/2021   HCT 25.1 (L) 04/10/2021   MCV 76 (L) 04/10/2021   PLT 244 04/10/2021   Lab Results  Component Value Date   NA 132 (L) 04/10/2021   K 3.7 04/10/2021   CO2 20 04/10/2021   GLUCOSE 68 (L) 04/10/2021   BUN 13 04/10/2021   CREATININE 0.71 04/10/2021   BILITOT 6.0 (H) 04/10/2021   ALKPHOS 166 (H) 04/10/2021   AST 55 (H) 04/10/2021   ALT 12 04/10/2021   PROT 5.9 (L) 04/10/2021   ALBUMIN 2.7 (L) 04/10/2021   CALCIUM 8.6 (L) 04/10/2021   ANIONGAP 9 12/27/2020   EGFR 102 04/10/2021   Lab Results  Component Value Date   CHOL 103 04/10/2021   Lab Results  Component Value Date   HDL 23 (L) 04/10/2021   Lab Results  Component Value Date   LDLCALC 65 04/10/2021   Lab Results  Component Value  Date   TRIG 69 04/10/2021   Lab Results  Component Value Date   CHOLHDL 4.5 (H) 04/10/2021   Lab Results  Component Value Date   HGBA1C CANCELED 04/10/2021      Assessment & Plan:   Problem List Items Addressed This Visit       Cardiovascular and Mediastinum   Benign essential hypertension    BP Readings from Last 1 Encounters:  04/13/21 (!) 98/54  Well-controlled with Metoprolol Mildly low due to recent vomiting and diarrhea, advised to avoid Torsemide which she takes PRN for leg swelling Counseled for compliance with the medications Advised DASH diet        Endocrine   Subclinical hypothyroidism    Lab Results  Component Value Date   TSH 5.060 (H) 04/10/2021  Due to chronic, diffuse pain/inflammation Will recheck TSH and free T4      Relevant Orders   TSH + free T4     Other   Chronic pain syndrome - Primary (Chronic)    Chronic  low back pain radiating to LE, due to lumbar facet arthropathy On chronic opioids, followed by pain clinic- broke pain contract and is on tapered dose of oxycodone currently Does not use marijuana now Referred to different pain clinic for second opinion      Relevant Medications   pregabalin (LYRICA) 150 MG capsule   Neurogenic pain (Chronic)    On Lyrica 150 mg twice daily, was given by previous PCP Uncontrolled neuropathy pain as she had run out of it, refilled for now Referred to Neurology      Relevant Medications   pregabalin (LYRICA) 150 MG capsule   Other Relevant Orders   Ambulatory referral to Neurology   Hypoalbuminemia due to protein-calorie malnutrition ([place])    Due to poor PO intake Ensure supplement samples given, advised to continue for now      Relevant Orders   CMP14+EGFR   Depression, recurrent ([place])    Appears to be related to chronic medical conditions Has tried Zoloft, Cymbalta, Effexor -did not tolerate/did not have much benefit Does not want to take any antidepressant currently Referred to [facility]  therapy      Other Visit Diagnoses     Refused influenza vaccine           Meds ordered this encounter  Medications   pregabalin (LYRICA) 150 MG capsule    Sig: Take 1 capsule (150 mg total) by mouth 2 (two) times daily.    Dispense:  60 capsule    Refill:  5    Follow-up: Return in about 3 months (around 07/11/2021).    Lindell Spar, MD

## 2021-04-16 ENCOUNTER — Other Ambulatory Visit: Payer: Self-pay | Admitting: Internal Medicine

## 2021-04-16 ENCOUNTER — Telehealth: Payer: Self-pay

## 2021-04-16 DIAGNOSIS — M792 Neuralgia and neuritis, unspecified: Secondary | ICD-10-CM

## 2021-04-16 MED ORDER — PREGABALIN 75 MG PO CAPS: 75.0000 mg | ORAL_CAPSULE | Freq: Two times a day (BID) | ORAL | 2 refills | Status: DC

## 2021-04-16 NOTE — Telephone Encounter (Signed)
Patient called said the Lyica 150mg  taking bid that Dr Posey Pronto put her on recently is too high, asking if provider will lower the dosage.

## 2021-04-16 NOTE — Telephone Encounter (Signed)
She only took one over the weekend and she is just now starting to come back to normal she would like 75 mg sent in instead

## 2021-04-16 NOTE — Telephone Encounter (Signed)
Pt advised with verbal understanding  °

## 2021-04-22 ENCOUNTER — Emergency Department (HOSPITAL_COMMUNITY): Payer: Self-pay

## 2021-04-22 ENCOUNTER — Other Ambulatory Visit: Payer: Self-pay

## 2021-04-22 ENCOUNTER — Inpatient Hospital Stay (HOSPITAL_COMMUNITY): Payer: Self-pay

## 2021-04-22 ENCOUNTER — Inpatient Hospital Stay (HOSPITAL_COMMUNITY)
Admission: EM | Admit: 2021-04-22 | Discharge: 2021-05-10 | DRG: 871 | Disposition: A | Discharge status: Hospice | Payer: Self-pay | Attending: Internal Medicine | Admitting: Internal Medicine

## 2021-04-22 DIAGNOSIS — K766 Portal hypertension: Secondary | ICD-10-CM | POA: Diagnosis present

## 2021-04-22 DIAGNOSIS — R6521 Severe sepsis with septic shock: Secondary | ICD-10-CM | POA: Diagnosis present

## 2021-04-22 DIAGNOSIS — D62 Acute posthemorrhagic anemia: Secondary | ICD-10-CM | POA: Diagnosis present

## 2021-04-22 DIAGNOSIS — F331 Major depressive disorder, recurrent, moderate: Secondary | ICD-10-CM | POA: Diagnosis present

## 2021-04-22 DIAGNOSIS — J189 Pneumonia, unspecified organism: Secondary | ICD-10-CM

## 2021-04-22 DIAGNOSIS — Z8249 Family history of ischemic heart disease and other diseases of the circulatory system: Secondary | ICD-10-CM

## 2021-04-22 DIAGNOSIS — D638 Anemia in other chronic diseases classified elsewhere: Secondary | ICD-10-CM | POA: Diagnosis present

## 2021-04-22 DIAGNOSIS — D689 Coagulation defect, unspecified: Secondary | ICD-10-CM | POA: Diagnosis present

## 2021-04-22 DIAGNOSIS — K922 Gastrointestinal hemorrhage, unspecified: Secondary | ICD-10-CM

## 2021-04-22 DIAGNOSIS — R000 Tachycardia, unspecified: Secondary | ICD-10-CM | POA: Diagnosis not present

## 2021-04-22 DIAGNOSIS — L89152 Pressure ulcer of sacral region, stage 2: Secondary | ICD-10-CM | POA: Diagnosis present

## 2021-04-22 DIAGNOSIS — Z975 Presence of (intrauterine) contraceptive device: Secondary | ICD-10-CM

## 2021-04-22 DIAGNOSIS — I10 Essential (primary) hypertension: Secondary | ICD-10-CM | POA: Diagnosis present

## 2021-04-22 DIAGNOSIS — Z888 Allergy status to other drugs, medicaments and biological substances status: Secondary | ICD-10-CM

## 2021-04-22 DIAGNOSIS — T17308A Unspecified foreign body in larynx causing other injury, initial encounter: Secondary | ICD-10-CM

## 2021-04-22 DIAGNOSIS — E860 Dehydration: Secondary | ICD-10-CM | POA: Diagnosis present

## 2021-04-22 DIAGNOSIS — G9341 Metabolic encephalopathy: Secondary | ICD-10-CM | POA: Diagnosis present

## 2021-04-22 DIAGNOSIS — M549 Dorsalgia, unspecified: Secondary | ICD-10-CM | POA: Diagnosis present

## 2021-04-22 DIAGNOSIS — Z20822 Contact with and (suspected) exposure to covid-19: Secondary | ICD-10-CM | POA: Diagnosis present

## 2021-04-22 DIAGNOSIS — R4182 Altered mental status, unspecified: Secondary | ICD-10-CM

## 2021-04-22 DIAGNOSIS — L8990 Pressure ulcer of unspecified site, unspecified stage: Secondary | ICD-10-CM | POA: Insufficient documentation

## 2021-04-22 DIAGNOSIS — D6489 Other specified anemias: Secondary | ICD-10-CM | POA: Diagnosis present

## 2021-04-22 DIAGNOSIS — D6959 Other secondary thrombocytopenia: Secondary | ICD-10-CM | POA: Diagnosis present

## 2021-04-22 DIAGNOSIS — Z452 Encounter for adjustment and management of vascular access device: Secondary | ICD-10-CM

## 2021-04-22 DIAGNOSIS — G4710 Hypersomnia, unspecified: Secondary | ICD-10-CM | POA: Diagnosis present

## 2021-04-22 DIAGNOSIS — R7989 Other specified abnormal findings of blood chemistry: Secondary | ICD-10-CM | POA: Diagnosis present

## 2021-04-22 DIAGNOSIS — A419 Sepsis, unspecified organism: Principal | ICD-10-CM

## 2021-04-22 DIAGNOSIS — E440 Moderate protein-calorie malnutrition: Secondary | ICD-10-CM | POA: Diagnosis present

## 2021-04-22 DIAGNOSIS — J9811 Atelectasis: Secondary | ICD-10-CM | POA: Diagnosis not present

## 2021-04-22 DIAGNOSIS — F410 Panic disorder [episodic paroxysmal anxiety] without agoraphobia: Secondary | ICD-10-CM | POA: Diagnosis present

## 2021-04-22 DIAGNOSIS — K5900 Constipation, unspecified: Secondary | ICD-10-CM | POA: Diagnosis not present

## 2021-04-22 DIAGNOSIS — K900 Celiac disease: Secondary | ICD-10-CM | POA: Diagnosis present

## 2021-04-22 DIAGNOSIS — R627 Adult failure to thrive: Secondary | ICD-10-CM | POA: Diagnosis present

## 2021-04-22 DIAGNOSIS — R109 Unspecified abdominal pain: Secondary | ICD-10-CM

## 2021-04-22 DIAGNOSIS — F319 Bipolar disorder, unspecified: Secondary | ICD-10-CM | POA: Diagnosis present

## 2021-04-22 DIAGNOSIS — Z79899 Other long term (current) drug therapy: Secondary | ICD-10-CM

## 2021-04-22 DIAGNOSIS — K7581 Nonalcoholic steatohepatitis (NASH): Secondary | ICD-10-CM | POA: Diagnosis present

## 2021-04-22 DIAGNOSIS — Z66 Do not resuscitate: Secondary | ICD-10-CM | POA: Diagnosis present

## 2021-04-22 DIAGNOSIS — Z515 Encounter for palliative care: Secondary | ICD-10-CM

## 2021-04-22 DIAGNOSIS — Z833 Family history of diabetes mellitus: Secondary | ICD-10-CM

## 2021-04-22 DIAGNOSIS — E669 Obesity, unspecified: Secondary | ICD-10-CM | POA: Diagnosis present

## 2021-04-22 DIAGNOSIS — Z880 Allergy status to penicillin: Secondary | ICD-10-CM

## 2021-04-22 DIAGNOSIS — I252 Old myocardial infarction: Secondary | ICD-10-CM

## 2021-04-22 DIAGNOSIS — Z7401 Bed confinement status: Secondary | ICD-10-CM

## 2021-04-22 DIAGNOSIS — Z885 Allergy status to narcotic agent status: Secondary | ICD-10-CM

## 2021-04-22 DIAGNOSIS — R160 Hepatomegaly, not elsewhere classified: Secondary | ICD-10-CM

## 2021-04-22 DIAGNOSIS — Z955 Presence of coronary angioplasty implant and graft: Secondary | ICD-10-CM

## 2021-04-22 DIAGNOSIS — E876 Hypokalemia: Secondary | ICD-10-CM

## 2021-04-22 DIAGNOSIS — R188 Other ascites: Secondary | ICD-10-CM

## 2021-04-22 DIAGNOSIS — Z560 Unemployment, unspecified: Secondary | ICD-10-CM

## 2021-04-22 DIAGNOSIS — G894 Chronic pain syndrome: Secondary | ICD-10-CM | POA: Diagnosis present

## 2021-04-22 DIAGNOSIS — F1011 Alcohol abuse, in remission: Secondary | ICD-10-CM | POA: Diagnosis present

## 2021-04-22 DIAGNOSIS — E872 Acidosis, unspecified: Secondary | ICD-10-CM | POA: Diagnosis present

## 2021-04-22 DIAGNOSIS — Z9114 Patient's other noncompliance with medication regimen: Secondary | ICD-10-CM

## 2021-04-22 DIAGNOSIS — Z881 Allergy status to other antibiotic agents status: Secondary | ICD-10-CM

## 2021-04-22 DIAGNOSIS — D696 Thrombocytopenia, unspecified: Secondary | ICD-10-CM

## 2021-04-22 DIAGNOSIS — Z800 Family history of malignant neoplasm of digestive organs: Secondary | ICD-10-CM

## 2021-04-22 DIAGNOSIS — Z87891 Personal history of nicotine dependence: Secondary | ICD-10-CM

## 2021-04-22 DIAGNOSIS — K7460 Unspecified cirrhosis of liver: Secondary | ICD-10-CM

## 2021-04-22 DIAGNOSIS — Z6835 Body mass index (BMI) 35.0-35.9, adult: Secondary | ICD-10-CM

## 2021-04-22 DIAGNOSIS — Z9049 Acquired absence of other specified parts of digestive tract: Secondary | ICD-10-CM

## 2021-04-22 DIAGNOSIS — R571 Hypovolemic shock: Secondary | ICD-10-CM | POA: Diagnosis present

## 2021-04-22 DIAGNOSIS — I2510 Atherosclerotic heart disease of native coronary artery without angina pectoris: Secondary | ICD-10-CM | POA: Diagnosis present

## 2021-04-22 DIAGNOSIS — E861 Hypovolemia: Secondary | ICD-10-CM | POA: Diagnosis present

## 2021-04-22 DIAGNOSIS — E8809 Other disorders of plasma-protein metabolism, not elsewhere classified: Secondary | ICD-10-CM | POA: Diagnosis present

## 2021-04-22 DIAGNOSIS — Z86010 Personal history of colonic polyps: Secondary | ICD-10-CM

## 2021-04-22 DIAGNOSIS — Z597 Insufficient social insurance and welfare support: Secondary | ICD-10-CM

## 2021-04-22 DIAGNOSIS — Z91030 Bee allergy status: Secondary | ICD-10-CM

## 2021-04-22 DIAGNOSIS — Z809 Family history of malignant neoplasm, unspecified: Secondary | ICD-10-CM

## 2021-04-22 DIAGNOSIS — K7290 Hepatic failure, unspecified without coma: Secondary | ICD-10-CM | POA: Diagnosis present

## 2021-04-22 DIAGNOSIS — E871 Hypo-osmolality and hyponatremia: Secondary | ICD-10-CM | POA: Diagnosis present

## 2021-04-22 DIAGNOSIS — K7682 Hepatic encephalopathy: Secondary | ICD-10-CM

## 2021-04-22 DIAGNOSIS — K769 Liver disease, unspecified: Secondary | ICD-10-CM

## 2021-04-22 DIAGNOSIS — N390 Urinary tract infection, site not specified: Secondary | ICD-10-CM | POA: Diagnosis present

## 2021-04-22 DIAGNOSIS — Z91119 Patient's noncompliance with dietary regimen due to unspecified reason: Secondary | ICD-10-CM

## 2021-04-22 DIAGNOSIS — Z818 Family history of other mental and behavioral disorders: Secondary | ICD-10-CM

## 2021-04-22 HISTORY — DX: Unspecified cirrhosis of liver: K74.60

## 2021-04-22 LAB — CBC WITH DIFFERENTIAL/PLATELET
Abs Immature Granulocytes: 0.14 10*3/uL — ABNORMAL HIGH (ref 0.00–0.07)
Basophils Absolute: 0 10*3/uL (ref 0.0–0.1)
Basophils Relative: 0 %
Eosinophils Absolute: 0.1 10*3/uL (ref 0.0–0.5)
Eosinophils Relative: 0 %
HCT: 17.9 % — ABNORMAL LOW (ref 36.0–46.0)
Hemoglobin: 6.3 g/dL — CL (ref 12.0–15.0)
Immature Granulocytes: 1 %
Lymphocytes Relative: 14 %
Lymphs Abs: 2.2 10*3/uL (ref 0.7–4.0)
MCH: 28 pg (ref 26.0–34.0)
MCHC: 35.2 g/dL (ref 30.0–36.0)
MCV: 79.6 fL — ABNORMAL LOW (ref 80.0–100.0)
Monocytes Absolute: 0.9 10*3/uL (ref 0.1–1.0)
Monocytes Relative: 6 %
Neutro Abs: 12.3 10*3/uL — ABNORMAL HIGH (ref 1.7–7.7)
Neutrophils Relative %: 79 %
Platelets: 137 10*3/uL — ABNORMAL LOW (ref 150–400)
RBC: 2.25 MIL/uL — ABNORMAL LOW (ref 3.87–5.11)
RDW: 18.8 % — ABNORMAL HIGH (ref 11.5–15.5)
WBC: 15.5 10*3/uL — ABNORMAL HIGH (ref 4.0–10.5)
nRBC: 0.3 % — ABNORMAL HIGH (ref 0.0–0.2)

## 2021-04-22 LAB — COMPREHENSIVE METABOLIC PANEL
ALT: 13 U/L (ref 0–44)
AST: 44 U/L — ABNORMAL HIGH (ref 15–41)
Albumin: 2.3 g/dL — ABNORMAL LOW (ref 3.5–5.0)
Alkaline Phosphatase: 109 U/L (ref 38–126)
Anion gap: 13 (ref 5–15)
BUN: 20 mg/dL (ref 6–20)
CO2: 25 mmol/L (ref 22–32)
Calcium: 7.5 mg/dL — ABNORMAL LOW (ref 8.9–10.3)
Chloride: 95 mmol/L — ABNORMAL LOW (ref 98–111)
Creatinine, Ser: 0.8 mg/dL (ref 0.44–1.00)
GFR, Estimated: >60 mL/min (ref >60)
Glucose, Bld: 98 mg/dL (ref 70–99)
Potassium: 2.3 mmol/L — CL (ref 3.5–5.1)
Sodium: 133 mmol/L — ABNORMAL LOW (ref 135–145)
Total Bilirubin: 7 mg/dL — ABNORMAL HIGH (ref 0.3–1.2)
Total Protein: 5.2 g/dL — ABNORMAL LOW (ref 6.5–8.1)

## 2021-04-22 LAB — PROTIME-INR
INR: 1.8 — ABNORMAL HIGH (ref 0.8–1.2)
Prothrombin Time: 21.1 seconds — ABNORMAL HIGH (ref 11.4–15.2)

## 2021-04-22 LAB — RESP PANEL BY RT-PCR (FLU A&B, COVID) ARPGX2
Influenza A by PCR: NEGATIVE
Influenza B by PCR: NEGATIVE
SARS Coronavirus 2 by RT PCR: NEGATIVE

## 2021-04-22 LAB — MRSA NEXT GEN BY PCR, NASAL: MRSA by PCR Next Gen: NOT DETECTED

## 2021-04-22 LAB — PREPARE RBC (CROSSMATCH)

## 2021-04-22 LAB — GLUCOSE, CAPILLARY
Glucose-Capillary: 81 mg/dL (ref 70–99)
Glucose-Capillary: 77 mg/dL (ref 70–99)

## 2021-04-22 LAB — APTT: aPTT: 43 seconds — ABNORMAL HIGH (ref 24–36)

## 2021-04-22 LAB — LACTIC ACID, PLASMA: Lactic Acid, Venous: 3.6 mmol/L (ref 0.5–1.9)

## 2021-04-22 MED ORDER — POTASSIUM CHLORIDE 10 MEQ/100ML IV SOLN
INTRAVENOUS | Status: AC
  Filled 2021-04-22: qty 100

## 2021-04-22 MED ORDER — LEVOFLOXACIN IN D5W 750 MG/150ML IV SOLN
750.0000 mg | Freq: Once | INTRAVENOUS | Status: AC
  Administered 2021-04-22: 750 mg via INTRAVENOUS
  Filled 2021-04-22: qty 150

## 2021-04-22 MED ORDER — POTASSIUM CHLORIDE 10 MEQ/100ML IV SOLN
10.0000 meq | Freq: Once | INTRAVENOUS | Status: AC
  Administered 2021-04-22: 10 meq via INTRAVENOUS
  Filled 2021-04-22: qty 100

## 2021-04-22 MED ORDER — PANTOPRAZOLE SODIUM 40 MG IV SOLR
40.0000 mg | Freq: Once | INTRAVENOUS | Status: AC
  Administered 2021-04-22: 40 mg via INTRAVENOUS
  Filled 2021-04-22: qty 10

## 2021-04-22 MED ORDER — SODIUM CHLORIDE 0.9 % IV SOLN: 10.0000 mL/h | Freq: Once | INTRAVENOUS | Status: DC

## 2021-04-22 MED ORDER — SODIUM CHLORIDE 0.9 % IV BOLUS
500.0000 mL | Freq: Once | INTRAVENOUS | Status: AC
Start: 2021-04-22 — End: 2021-04-22
  Administered 2021-04-22: 500 mL via INTRAVENOUS

## 2021-04-22 MED ORDER — LACTATED RINGERS IV SOLN: INTRAVENOUS | Status: DC

## 2021-04-22 MED ORDER — PANTOPRAZOLE SODIUM 40 MG IV SOLR: 40.0000 mg | INTRAVENOUS | Status: DC

## 2021-04-22 MED ORDER — LEVOFLOXACIN IN D5W 500 MG/100ML IV SOLN
500.0000 mg | INTRAVENOUS | Status: DC
  Filled 2021-04-22 (×2): qty 100

## 2021-04-22 MED ORDER — SODIUM CHLORIDE 0.9 % IV SOLN
10.0000 mL/h | Freq: Once | INTRAVENOUS | Status: AC
Start: 2021-04-22 — End: 2021-04-22
  Administered 2021-04-22: 10 mL/h via INTRAVENOUS

## 2021-04-22 MED ORDER — SODIUM CHLORIDE 0.9 % IV BOLUS
2000.0000 mL | Freq: Once | INTRAVENOUS | Status: AC
Start: 2021-04-22 — End: 2021-04-22
  Administered 2021-04-22: 2000 mL via INTRAVENOUS

## 2021-04-22 MED ORDER — METRONIDAZOLE 500 MG/100ML IV SOLN
500.0000 mg | Freq: Once | INTRAVENOUS | Status: DC
  Filled 2021-04-22: qty 100

## 2021-04-22 MED ORDER — POTASSIUM CHLORIDE 10 MEQ/100ML IV SOLN
10.0000 meq | Freq: Once | INTRAVENOUS | Status: AC
  Administered 2021-04-22: 10 meq via INTRAVENOUS

## 2021-04-22 NOTE — Progress Notes (Addendum)
eLink Physician-Brief Progress Note Patient Name: Sharon Lawrence DOB: 03/13/1965 MRN: [id number]   Date of Service  04/22/2021  HPI/Events of Note  72 F history of celiac disease, gastroparesis, NASH, CAD s/p PCI to RCA 2019, wheelchair dependent due to generalized weakness and now with sacral ulcers. She presented to [facility] ED with nausea and vomiting. Also with report of bloody urine and stool. Complains of bladder spasm. She was hypotensive with Hgb 6.3. She has received 2.5 liters crystalloids and is on her second unit PRBC.  BP 99/68  HR 96  O2 100% not on pressors Conversant and appropriate  Last colonoscopy 09/2017 polyps and external hemorrhoids Last EGD 07/2020 mild gastritis and gastropathy, chronic duodenitis and with findings consistent with active celiac disease  eICU Interventions  Continued volume resuscitation with blood products and fluids as needed. On Levofloxacin and metronidazole empirically. Urinalysis pending IDA from celiac disease and questionable ongoing bleed Ongoing electrolyte replacement. Monitoring Discussed with bedside RN and bedside CCM team     Intervention Category Evaluation Type: New Patient Evaluation  Judd Lien 04/22/2021, 10:21 PM

## 2021-04-22 NOTE — Progress Notes (Signed)
Pharmacy Antibiotic Note  Sharon Lawrence is a 56 y.o. female admitted on 04/22/2021 with [facility].Marland Kitchen  Pharmacy has been consulted for Levaquin dosing.  Plan: Levaquin 732m x 1 then 5066mIV q24h  Height: 5' (152.4 cm) Weight: 82.3 kg (181 lb 7 oz) IBW/kg (Calculated) : 45.5  Temp (24hrs), Avg:97.9 F (36.6 C), Min:97.9 F (36.6 C), Max:97.9 F (36.6 C)  Recent Labs  Lab 04/22/21 1509  WBC 15.5*  CREATININE 0.80  LATICACIDVEN 3.6*    Estimated Creatinine Clearance: 77.3 mL/min (by C-G formula based on SCr of 0.8 mg/dL).    Allergies  Allergen Reactions   Bee Venom Anaphylaxis   Erythromycin Anaphylaxis   Cephalexin    Neomycin-Bacitracin-Polymyxin  [Bacitracin-Neomycin-Polymyxin] Swelling    Sight of application   Neosporin Original [Bacitracin-Neomycin-Polymyxin] Swelling    Swelling at site of application   Prednisone    Sulfa Antibiotics Swelling    Sulfa eye drops caused eyes to swell Sulfa eye drops caused eyes to swell   Tobrex [Tobramycin] Swelling   Tramadol Hives   Penicillins Rash    Has patient had a PCN reaction causing immediate rash, facial/tongue/throat swelling, SOB or lightheadedness with hypotension: Yes Has patient had a PCN reaction causing severe rash involving mucus membranes or skin necrosis: No Has patient had a PCN reaction that required hospitalization: No Has patient had a PCN reaction occurring within the last 10 years: No If all of the above answers are "NO", then may proceed with Cephalosporin use.     Antimicrobials this admission: Levaquin 2/19 >>  Flagyl 2/19 >>   Dose adjustments this admission:   Microbiology results:  BCx: pending  UCx:    Sputum:    MRSA PCR:   Thank you for allowing pharmacy to be a part of this patients care.  HaHart Robinsons 04/22/2021 4:15 PM

## 2021-04-22 NOTE — ED Notes (Signed)
Pt refusing to be stuck for blood cultures at this time.

## 2021-04-22 NOTE — ED Notes (Signed)
Spoke with [facility] will sent transportation after 1900.

## 2021-04-22 NOTE — ED Notes (Signed)
Nurse attempted IV access x2 without success. Provider updated and PA attempting at this time.

## 2021-04-22 NOTE — ED Triage Notes (Signed)
Patient reports nausea, vomiting, dizzness, dehydration, lower abdominal pain for the past week with bilateral leg numbness. Patient is jaundice. BP low in triage.

## 2021-04-22 NOTE — H&P (Addendum)
NAME:  Sharon Lawrence, MRN:  [id number], DOB:  August 30, 1965, LOS: 0 ADMISSION DATE:  04/22/2021, CONSULTATION DATE:  2/19 REFERRING MD:  Roderic Palau, CHIEF COMPLAINT:  nausea and vomiting   History of Present Illness:   Sharon Lawrence, is a 56 y.o. female, who presented to the [facility] ED with a chief complaint of nausea and vomiting   They have a pertinent past medical history of CAD, STEMI (2019), celiac disease, GIB, NASH, chronic pain  Prior to arrival, patient reports nausea, vomiting, dizziness, dehydration, low abdominal pain, bilateral leg numbness, over the past week.  ED course was notable for development of hypotension 68/55, heart rate 106, Tmax 97.6 F.  She was started on Levaquin and Flagyl.  Blood cultures were ordered. Afrbrile, WBC 15.5, hemoglobin 6.3, platelets 137, Lactic acid 3.6, Sodium 133, potassium 2.3, chloride 95.  1 unit of PRBCs was ordered.  PCCM was consulted for admission  Pertinent  Medical History  CAD, STEMI (2019), celiac disease, GIB, NASH, chronic pain  Significant Hospital Events: Including procedures, antibiotic start and stop dates in addition to other pertinent events   2/19: Presented to [facility] ED, hypotensive, hemoglobin 6.3, 1 unit PRBC, transferred to [facility]  Interim History / Subjective:  See above  Blood transfusing  Subjective: Denies chest pain, SOB, sputum production, endorses active bladder spasms, denies melena, BRBPR, hematemesis  Objective   Blood pressure (!) 98/56, pulse (!) 102, temperature 97.6 F (36.4 C), temperature source Oral, resp. rate 19, height 5' (1.524 m), weight 82.3 kg, SpO2 98 %.        Intake/Output Summary (Last 24 hours) at 04/22/2021 2141 Last data filed at 04/22/2021 1839 Gross per 24 hour  Intake 501.62 ml  Output --  Net 501.62 ml   Filed Weights   04/22/21 1410  Weight: 82.3 kg    Examination: General: In bed, NAD, unwell appearing, jaundiced  HEENT: MM pink/dry, icteric, atraumatic Neuro:  RASS 0, PERRL 62m, GCS 15, MAE, 5/5 uppers, 3/5 lowers, deep and fine sensation intact CV: S1S2, NSR, no m/r/g appreciated PULM:  clear in the upper lobes, clear in the lower lobes, trachea midline, chest expansion symmetric GI: soft, bsx4 hypoactive, no tenderness on exam Extremities: warm/dry, +2 pretibial edema, capillary refill less than 3 seconds  Skin: stage 2 to bottom no rashes or lesions noted  Pertinent labs: Lactic acid 3.6 Sodium 133, potassium 2.3, chloride 95, Albumin 2.3 AST 44 Bilirubin 7.0 WBC 15.5, hemoglobin 6.3, platelets 137 INR 1.8, PTT 43 Chest x-ray: No pneumonia seen, possible trace left pleural effusion, atelectasis Twelve-lead: Sinus rhythm, slightly prolonged QTc, no ST changes noted  Resolved Hospital Problem list     Assessment & Plan:  ? Septic vs hypovolemic shock, suspect secondary to UTI Lactic acidosis, secondary to shock Hypotensive in the ED.  Hemoglobin 6.3.  Labs concerning for dehydration.  Reports of nausea, vomiting, dehydration prior to admission.  Afebrile, WBC15.5, lactic acid 3.6. 2.5 L IVF in Ed. ? -Goal MAP 65 or greater. Levophed ordered. Titrate medication to goal -Follow up BC and UC -On Levaquin and Flagyl. Narrow as cultures result. Patient with high risk PCN allergy. -Trend lactate -Obtain procalcitonin, MRSA PCR -Obtain ECHO -Follow up CBC, CMP, Coags -Follow up CXR  Abdominal pain History of celiac disease No abdominal pain on exam -Consider right upper quadrant ultrasound vs CT abdomen in AM -Monitor  Lower extremity weakness Patient states has become progressively weaker over the past few months. Now in  wheel chair. Uses bedside commode. MAE, sensation intact. ?malnutrition, ?HF -follow up ECHO -Consider neuro consult and imaging.  Acute electrolyte abnormalities: Hypokalemia, hyponatremia Suspect secondary to dehydration. Repleted in ED -Replete -Follow-up labs post fluid resuscitation -Goal potassium greater  than 4, goal magnesium greater than 2  Transaminitis,Suspect secondary to shock INR elevation, Suspect secondary to shock HX NASH -Monitor on labs -Supportive care as above  Nausea and vomiting HX gastroparesis  Suspect secondary to UTI -Prn antiemetics -supportive care as above  Acute on chronic blood loss anemia Thrombocytopenia Bilirubinemia Appears chronically anemic on baseline labs.  History of GI bleed. Received 1 unit of PRBCs in the emergency department -Transfuse PRBC if HBG less than 7 -Obtain AM CBC to trend H&H and PLT -Monitor for signs of bleeding -A.m. GI consult once stable  History of CAD, STEMI Prolonged QTC, borderline -PRN EKG. Limit QT prolonging drugs -Resume home antihypertensives and diuretics once stable -Consider resumption of aspirin 81 mg post a.m. labs  Hypoalbuminemia -RD consult  Stage 2 PI, POA -WOC consult ordered  Best Practice (right click and "Reselect all SmartList Selections" daily)   Diet/type: NPO w/ oral meds DVT prophylaxis: SCD GI prophylaxis: PPI Lines: N/A Foley:  N/A Code Status:  full code Last date of multidisciplinary goals of care discussion [pending]  Labs   CBC: Recent Labs  Lab 04/22/21 1509  WBC 15.5*  NEUTROABS 12.3*  HGB 6.3*  HCT 17.9*  MCV 79.6*  PLT 137*    Basic Metabolic Panel: Recent Labs  Lab 04/22/21 1509  NA 133*  K 2.3*  CL 95*  CO2 25  GLUCOSE 98  BUN 20  CREATININE 0.80  CALCIUM 7.5*   GFR: Estimated Creatinine Clearance: 77.3 mL/min (by C-G formula based on SCr of 0.8 mg/dL). Recent Labs  Lab 04/22/21 1509  WBC 15.5*  LATICACIDVEN 3.6*    Liver Function Tests: Recent Labs  Lab 04/22/21 1509  AST 44*  ALT 13  ALKPHOS 109  BILITOT 7.0*  PROT 5.2*  ALBUMIN 2.3*   No results for input(s): LIPASE, AMYLASE in the last 168 hours. No results for input(s): AMMONIA in the last 168 hours.  ABG    Component Value Date/Time   TCO2 24 06/25/2020 1902      Coagulation Profile: Recent Labs  Lab 04/22/21 1509  INR 1.8*    Cardiac Enzymes: No results for input(s): CKTOTAL, CKMB, CKMBINDEX, TROPONINI in the last 168 hours.  HbA1C: Hgb A1c MFr Bld  Date/Time Value Ref Range Status  04/10/2021 04:31 PM CANCELED %     Comment:    [facility] was unable to collect sufficient specimen to perform the following test(s), and is providing the patient with re-collection instructions.          Prediabetes: 5.7 - 6.4          Diabetes: >6.4          Glycemic control for adults with diabetes: <7.0  Result canceled by the ancillary.   09/16/2017 03:58 AM 5.6 4.8 - 5.6 % Final    Comment:    (NOTE)         Prediabetes: 5.7 - 6.4         Diabetes: >6.4         Glycemic control for adults with diabetes: <7.0     CBG: Recent Labs  Lab 04/22/21 2131  GLUCAP 81    Review of Systems:   Positives in bold  Gen: fever, chills, weight change, fatigue, night  sweats HEENT:  blurred vision, double vision, hearing loss, tinnitus, sinus congestion, rhinorrhea, sore throat, neck stiffness, dysphagia PULM:  shortness of breath, cough, sputum production, hemoptysis, wheezing CV: chest pain, edema, orthopnea, paroxysmal nocturnal dyspnea, palpitations GI:  abdominal pain, nausea, vomiting, diarrhea, hematochezia, melena, constipation, change in bowel habits GU: dysuria, hematuria, polyuria, oliguria, urethral discharge, spasm Endocrine: hot or cold intolerance, polyuria, polyphagia or appetite change Derm: rash, dry skin, scaling or peeling skin change Heme: easy bruising, bleeding, bleeding gums Neuro: headache, numbness, weakness, slurred speech, loss of memory or consciousness   Past Medical History:  She,  has a past medical history of Acute ST elevation myocardial infarction (STEMI) of inferior wall ([place]) (09/15/2017), ADHD (attention deficit hyperactivity disorder), Anxiety, B12 deficiency (02/07/2011), Back pain, CAD (coronary artery disease),  Celiac disease, Folic acid deficiency (02/07/2011), GI bleed (11/06/2017), Headache, History of ST elevation myocardial infarction (STEMI) (11/06/2017), Iron deficiency anemia (02/07/2011), Myocardial infarction ([place]) (09/15/2017), and Syncope and collapse (11/06/2017).   Surgical History:   Past Surgical History:  Procedure Laterality Date   BIOPSY  07/12/2020   Procedure: BIOPSY;  Surgeon: Rogene Houston, MD;  Location: [facility];  Service: Endoscopy;;  duodenum(2nd part);antral   CHOLECYSTECTOMY     COLONOSCOPY WITH PROPOFOL N/A 09/26/2017   rehman examined portion of ileum normal, - Five small, non-bleeding polyps in the rectum, in the sigmoid colon and at the hepatic   CORONARY STENT INTERVENTION Right 09/15/2017   Procedure: CORONARY STENT INTERVENTION;  Surgeon: Jettie Booze, MD;  Location: [facility];  Service: Cardiovascular;  Laterality: Right;  RCA   ESOPHAGOGASTRODUODENOSCOPY (EGD) WITH PROPOFOL N/A 07/12/2020   rehman: normal hypopharynx, Normal esophagus. z line regular 35 cm from incisors, congested and erythematous mucosa in antrum, mucosal changes in duodenum, very pronounced changes to suggest active celiac disease   KNEE CARTILAGE SURGERY Right    LEFT HEART CATH AND CORONARY ANGIOGRAPHY N/A 09/15/2017   Procedure: LEFT HEART CATH AND CORONARY ANGIOGRAPHY;  Surgeon: Jettie Booze, MD;  Location: [place];  Service: Cardiovascular;  Laterality: N/A;   TONSILLECTOMY       Social History:   reports that she quit smoking about 4 years ago. Her smoking use included cigarettes. She has a 3.00 pack-year smoking history. She has never used smokeless tobacco. She reports that she does not drink alcohol and does not use drugs.   Family History:  Her family history includes Anxiety disorder in her sister; Bipolar disorder in her son; CAD in her maternal grandfather and paternal grandmother; Cancer in her mother and sister; Cirrhosis in her paternal  grandfather; Depression in her sister; Diabetes in her paternal grandmother; Drug abuse in her son; Hypertension in her father and mother; Hypothyroidism in her father and paternal grandmother.   Allergies Allergies  Allergen Reactions   Bee Venom Anaphylaxis   Erythromycin Anaphylaxis   Cephalexin    Neomycin-Bacitracin-Polymyxin  [Bacitracin-Neomycin-Polymyxin] Swelling    Sight of application   Neosporin Original [Bacitracin-Neomycin-Polymyxin] Swelling    Swelling at site of application   Prednisone    Sulfa Antibiotics Swelling    Sulfa eye drops caused eyes to swell Sulfa eye drops caused eyes to swell   Tobrex [Tobramycin] Swelling   Tramadol Hives   Penicillins Rash    Has patient had a PCN reaction causing immediate rash, facial/tongue/throat swelling, SOB or lightheadedness with hypotension: Yes Has patient had a PCN reaction causing severe rash involving mucus membranes or skin necrosis: No Has  patient had a PCN reaction that required hospitalization: No Has patient had a PCN reaction occurring within the last 10 years: No If all of the above answers are "NO", then may proceed with Cephalosporin use.      Home Medications  Prior to Admission medications   Medication Sig Start Date End Date Taking? Authorizing Provider  famotidine (PEPCID) 40 MG tablet Take 1 tablet (40 mg total) by mouth at bedtime. 10/26/20  Yes Rehman, Mechele Dawley, MD  ketoconazole (NIZORAL) 2 % cream Apply topically 2 (two) times daily. 12/16/20  Yes Johnson, Eldridge Dace, MD  levonorgestrel (MIRENA) 20 MCG/24HR IUD by Intrauterine route.   Yes [provider]  nitroGLYCERIN (NITROLINGUAL) 0.4 MG/SPRAY spray Place 2 sprays under the tongue every 5 (five) minutes x 3 doses as needed. 04/03/20  Yes [provider]  nystatin (MYCOSTATIN/NYSTOP) powder Apply topically 3 (three) times daily as needed (yeast infection). 12/16/20  Yes Johnson, Clanford L, MD  omeprazole (PRILOSEC) 40 MG capsule  Take 1 capsule (40 mg total) by mouth daily. 11/28/20  Yes Carlan, Chelsea L, NP  ondansetron (ZOFRAN-ODT) 8 MG disintegrating tablet Take 1 tablet (8 mg total) by mouth 3 (three) times daily as needed for nausea. 07/14/20  Yes Kathie Dike, MD  potassium chloride (KLOR-CON) 10 MEQ tablet Take 10 meq daily when you take Torsemide 12/28/20  Yes Strader, Palacios, PA-C  pregabalin (LYRICA) 75 MG capsule Take 1 capsule (75 mg total) by mouth 2 (two) times daily. 04/16/21  Yes Lindell Spar, MD  torsemide (DEMADEX) 20 MG tablet Take 1 tablet (20 mg total) by mouth daily as needed (edema). As needed. 12/16/20  Yes Johnson, Clanford L, MD  aspirin 81 MG chewable tablet Chew 1 tablet (81 mg total) by mouth daily. Patient not taking: Reported on 04/22/2021 09/18/17   Reino Bellis B, NP  loperamide (IMODIUM) 2 MG capsule Take 1 capsule (2 mg total) by mouth 3 (three) times daily as needed for diarrhea or loose stools. 07/14/20   Kathie Dike, MD  metoCLOPramide (REGLAN) 10 MG tablet Take 1 tablet (10 mg total) by mouth every 8 (eight) hours as needed for nausea. 12/16/20   Johnson, Clanford L, MD  metoprolol succinate (TOPROL XL) 25 MG 24 hr tablet Take 1 tablet (25 mg total) by mouth daily. 12/27/20   Strader, Fransisco Hertz, PA-C  oxyCODONE (OXY IR/ROXICODONE) 5 MG immediate release tablet Take 1 tablet (5 mg total) by mouth in the morning and at bedtime for 7 days, THEN 1 tablet (5 mg total) daily for 7 days. Patient not taking: Reported on 04/05/2021 01/16/21 01/30/21  Milinda Pointer, MD  prochlorperazine (COMPAZINE) 10 MG tablet TAKE (1) TABLET BY MOUTH EVERY SIX HOURS AS NEEDED NAUSEA OR VOMITING. Patient taking differently: Take 10 mg by mouth every 6 (six) hours as needed for nausea or vomiting. 10/10/20   Rehman, Mechele Dawley, MD  rizatriptan (MAXALT) 10 MG tablet Take 10 mg by mouth as needed. 12/31/19   [provider]  tiZANidine (ZANAFLEX) 4 MG tablet Take 1 tablet (4 mg total) by mouth  every 8 (eight) hours as needed for muscle spasms. 01/11/21   Lindell Spar, MD  Vitamin D, Ergocalciferol, (DRISDOL) 1.25 MG (50000 UT) CAPS capsule Take 50,000 Units by mouth every 7 (seven) days. Wednesday 09/14/18   [provider]     Critical care time: 39 minutes     Redmond School., MSN, APRN, AGACNP-BC [facility]  03/30/2021 ,  10:36 AM   Please see [email or web address] for pager details   If no response, please call [phone] After hours, please call Elink at [phone]

## 2021-04-22 NOTE — Sepsis Progress Note (Signed)
Repeat LA not drawn. Pt does not want anymore sticks at this time.

## 2021-04-22 NOTE — ED Provider Notes (Signed)
[facility] Provider Note   CSN: [id number] Arrival date & time: 04/22/21  1400     History  Chief Complaint  Patient presents with   Weakness    Sharon Lawrence is a 56 y.o. female.  Patient presents with vomiting and lower abdominal discomfort.  Patient has a history of celiac's disease and chronic abdominal pain with persistent vomiting  The history is provided by the patient and medical records. No language interpreter was used.  Weakness Severity:  Moderate Timing:  Constant Progression:  Worsening Chronicity:  Recurrent Context: not alcohol use   Relieved by:  Nothing Worsened by:  Nothing Ineffective treatments:  None tried Associated symptoms: abdominal pain   Associated symptoms: no chest pain, no cough, no diarrhea, no frequency, no headaches and no seizures       Home Medications Prior to Admission medications   Medication Sig Start Date End Date Taking? Authorizing Provider  famotidine (PEPCID) 40 MG tablet Take 1 tablet (40 mg total) by mouth at bedtime. 10/26/20  Yes Rehman, Mechele Dawley, MD  ketoconazole (NIZORAL) 2 % cream Apply topically 2 (two) times daily. 12/16/20  Yes Johnson, Eldridge Dace, MD  levonorgestrel (MIRENA) 20 MCG/24HR IUD by Intrauterine route.   Yes [provider]  nitroGLYCERIN (NITROLINGUAL) 0.4 MG/SPRAY spray Place 2 sprays under the tongue every 5 (five) minutes x 3 doses as needed. 04/03/20  Yes [provider]  nystatin (MYCOSTATIN/NYSTOP) powder Apply topically 3 (three) times daily as needed (yeast infection). 12/16/20  Yes Johnson, Clanford L, MD  omeprazole (PRILOSEC) 40 MG capsule Take 1 capsule (40 mg total) by mouth daily. 11/28/20  Yes Carlan, Chelsea L, NP  ondansetron (ZOFRAN-ODT) 8 MG disintegrating tablet Take 1 tablet (8 mg total) by mouth 3 (three) times daily as needed for nausea. 07/14/20  Yes Kathie Dike, MD  potassium chloride (KLOR-CON) 10 MEQ tablet Take 10 meq daily when you take  Torsemide 12/28/20  Yes Strader, [place], PA-C  pregabalin (LYRICA) 75 MG capsule Take 1 capsule (75 mg total) by mouth 2 (two) times daily. 04/16/21  Yes Lindell Spar, MD  torsemide (DEMADEX) 20 MG tablet Take 1 tablet (20 mg total) by mouth daily as needed (edema). As needed. 12/16/20  Yes Johnson, Clanford L, MD  aspirin 81 MG chewable tablet Chew 1 tablet (81 mg total) by mouth daily. Patient not taking: Reported on 04/22/2021 09/18/17   Reino Bellis B, NP  loperamide (IMODIUM) 2 MG capsule Take 1 capsule (2 mg total) by mouth 3 (three) times daily as needed for diarrhea or loose stools. 07/14/20   Kathie Dike, MD  metoCLOPramide (REGLAN) 10 MG tablet Take 1 tablet (10 mg total) by mouth every 8 (eight) hours as needed for nausea. 12/16/20   Johnson, Clanford L, MD  metoprolol succinate (TOPROL XL) 25 MG 24 hr tablet Take 1 tablet (25 mg total) by mouth daily. 12/27/20   Strader, Fransisco Hertz, PA-C  oxyCODONE (OXY IR/ROXICODONE) 5 MG immediate release tablet Take 1 tablet (5 mg total) by mouth in the morning and at bedtime for 7 days, THEN 1 tablet (5 mg total) daily for 7 days. Patient not taking: Reported on 04/05/2021 01/16/21 01/30/21  Milinda Pointer, MD  prochlorperazine (COMPAZINE) 10 MG tablet TAKE (1) TABLET BY MOUTH EVERY SIX HOURS AS NEEDED NAUSEA OR VOMITING. Patient taking differently: Take 10 mg by mouth every 6 (six) hours as needed for nausea or vomiting. 10/10/20   Rehman, Mechele Dawley, MD  rizatriptan (  MAXALT) 10 MG tablet Take 10 mg by mouth as needed. 12/31/19   [provider]  tiZANidine (ZANAFLEX) 4 MG tablet Take 1 tablet (4 mg total) by mouth every 8 (eight) hours as needed for muscle spasms. 01/11/21   Lindell Spar, MD  Vitamin D, Ergocalciferol, (DRISDOL) 1.25 MG (50000 UT) CAPS capsule Take 50,000 Units by mouth every 7 (seven) days. Wednesday 09/14/18   [provider]      Allergies    Bee venom, Erythromycin, Cephalexin,  Neomycin-bacitracin-polymyxin  [bacitracin-neomycin-polymyxin], Neosporin original [bacitracin-neomycin-polymyxin], Prednisone, Sulfa antibiotics, Tobrex [tobramycin], Tramadol, and Penicillins    Review of Systems   Review of Systems  Constitutional:  Negative for appetite change and fatigue.  HENT:  Negative for congestion, ear discharge and sinus pressure.   Eyes:  Negative for discharge.  Respiratory:  Negative for cough.   Cardiovascular:  Negative for chest pain.  Gastrointestinal:  Positive for abdominal pain. Negative for diarrhea.  Genitourinary:  Negative for frequency and hematuria.  Musculoskeletal:  Negative for back pain.  Skin:  Negative for rash.  Neurological:  Positive for weakness. Negative for seizures and headaches.  Psychiatric/Behavioral:  Negative for hallucinations.    Physical Exam Updated Vital Signs BP (!) 98/56    Pulse (!) 102    Temp 97.6 F (36.4 C) (Oral)    Resp 19    Ht 5' (1.524 m)    Wt 82.3 kg    SpO2 98%    BMI 35.43 kg/m  Physical Exam Vitals and nursing note reviewed.  Constitutional:      Appearance: She is well-developed. She is ill-appearing.  HENT:     Head: Normocephalic.     Nose: Nose normal.  Eyes:     General: No scleral icterus.    Conjunctiva/sclera: Conjunctivae normal.  Neck:     Thyroid: No thyromegaly.  Cardiovascular:     Rate and Rhythm: Normal rate and regular rhythm.     Heart sounds: No murmur heard.   No friction rub. No gallop.  Pulmonary:     Breath sounds: No stridor. No wheezing or rales.  Chest:     Chest wall: No tenderness.  Abdominal:     General: There is no distension.     Tenderness: There is abdominal tenderness. There is no rebound.  Genitourinary:    Comments: Rectal heme positive Musculoskeletal:        General: Normal range of motion.     Cervical back: Neck supple.  Lymphadenopathy:     Cervical: No cervical adenopathy.  Skin:    Findings: No erythema or rash.  Neurological:     Mental  Status: She is alert and oriented to person, place, and time.     Motor: No abnormal muscle tone.     Coordination: Coordination normal.  Psychiatric:        Behavior: Behavior normal.    ED Results / Procedures / Treatments   Labs (all labs ordered are listed, but only abnormal results are displayed) Labs Reviewed  LACTIC ACID, PLASMA - Abnormal; Notable for the following components:      Result Value   Lactic Acid, Venous 3.6 (*)    All other components within normal limits  COMPREHENSIVE METABOLIC PANEL - Abnormal; Notable for the following components:   Sodium 133 (*)    Potassium 2.3 (*)    Chloride 95 (*)    Calcium 7.5 (*)    Total Protein 5.2 (*)    Albumin 2.3 (*)  AST 44 (*)    Total Bilirubin 7.0 (*)    All other components within normal limits  CBC WITH DIFFERENTIAL/PLATELET - Abnormal; Notable for the following components:   WBC 15.5 (*)    RBC 2.25 (*)    Hemoglobin 6.3 (*)    HCT 17.9 (*)    MCV 79.6 (*)    RDW 18.8 (*)    Platelets 137 (*)    nRBC 0.3 (*)    Neutro Abs 12.3 (*)    Abs Immature Granulocytes 0.14 (*)    All other components within normal limits  PROTIME-INR - Abnormal; Notable for the following components:   Prothrombin Time 21.1 (*)    INR 1.8 (*)    All other components within normal limits  APTT - Abnormal; Notable for the following components:   aPTT 43 (*)    All other components within normal limits  RESP PANEL BY RT-PCR (FLU A&B, COVID) ARPGX2  URINALYSIS, ROUTINE W REFLEX MICROSCOPIC  POC URINE PREG, ED  TYPE AND SCREEN  PREPARE RBC (CROSSMATCH)  PREPARE RBC (CROSSMATCH)  PREPARE RBC (CROSSMATCH)    EKG EKG Interpretation  Date/Time:  Sunday April 22 2021 14:25:58 EST Ventricular Rate:  97 PR Interval:  109 QRS Duration: 98 QT Interval:  401 QTC Calculation: 510 R Axis:   70 Text Interpretation: Sinus rhythm Short PR interval Borderline repolarization abnormality Borderline prolonged QT interval Confirmed by  Milton Ferguson [phone]) on 04/22/2021 3:15:45 PM  Radiology DG Chest Port 1 View  Result Date: 04/22/2021 CLINICAL DATA:  Questionable sepsis - evaluate for abnormality EXAM: PORTABLE CHEST 1 VIEW COMPARISON:  Jul 09, 2020. FINDINGS: Question trace left pleural effusion. Overlying linear left basilar opacity. No visible pneumothorax. Cardiomediastinal silhouette is within normal limits and similar. Cholecystectomy clips. IMPRESSION: Question trace left pleural effusion. Overlying linear opacity most likely represents atelectasis although infection is not excluded. Dedicated PA and lateral radiographs could better characterize if clinically warranted. Electronically Signed   By: Margaretha Sheffield M.D.   On: 04/22/2021 15:58    Procedures Procedures    Medications Ordered in ED Medications  metroNIDAZOLE (FLAGYL) IVPB 500 mg (500 mg Intravenous New Bag/Given 04/22/21 2020)  levofloxacin (LEVAQUIN) IVPB 500 mg (has no administration in time range)  0.9 %  sodium chloride infusion (has no administration in time range)  levofloxacin (LEVAQUIN) IVPB 750 mg (0 mg Intravenous Stopped 04/22/21 1732)  sodium chloride 0.9 % bolus 2,000 mL (2,000 mLs Intravenous Bolus 04/22/21 1540)  0.9 %  sodium chloride infusion (10 mL/hr Intravenous New Bag/Given 04/22/21 1710)  potassium chloride 10 mEq in 100 mL IVPB (10 mEq Intravenous New Bag/Given 04/22/21 1839)  potassium chloride 10 mEq in 100 mL IVPB (0 mEq Intravenous Stopped 04/22/21 1839)  pantoprazole (PROTONIX) injection 40 mg (40 mg Intravenous Given 04/22/21 1702)  sodium chloride 0.9 % bolus 500 mL (0 mLs Intravenous Stopped 04/22/21 1839)    ED Course/ Medical Decision Making/ A&P  CRITICAL CARE Performed by: Milton Ferguson Total critical care time: 65 minutes Critical care time was exclusive of separately billable procedures and treating other patients. Critical care was necessary to treat or prevent imminent or life-threatening deterioration. Critical  care was time spent personally by me on the following activities: development of treatment plan with patient and/or surrogate as well as nursing, discussions with consultants, evaluation of patient's response to treatment, examination of patient, obtaining history from patient or surrogate, ordering and performing treatments and interventions, ordering and review of laboratory studies, ordering  and review of radiographic studies, pulse oximetry and re-evaluation of patient's condition.   Patient with persistent hypotension even after 30/kg bolus.  She is also anemic.  Has upper GI bleed.  I spoke with critical care Dr. Lamonte Sakai and he agreed to admit the patient for [facility].  She will be given 2 units of blood                          Medical Decision Making Amount and/or Complexity of Data Reviewed Labs: ordered. Radiology: ordered. ECG/medicine tests: ordered.  Risk Prescription drug management. Decision regarding hospitalization.   Patient with sepsis hypotension and upper GI bleed.   This patient presents to the ED for concern of weakness, this involves an extensive number of treatment options, and is a complaint that carries with it a high risk of complications and morbidity.  The differential diagnosis includes sepsis anemia   Co morbidities that complicate the patient evaluation  Celiac disease   Additional history obtained:  Additional history obtained from relative External records from outside source obtained and reviewed including hospital records   Lab Tests:  I Ordered, and personally interpreted labs.  The pertinent results include: CBC that showed anemia with hemoglobin of 6.3.  Also chemistries show hypokalemia at 2.3.  She also has a white count elevated 15,000   Imaging Studies ordered:  I ordered imaging studies including chest x-ray I independently visualized and interpreted imaging which showed atelectasis I agree with the radiologist  interpretation   Cardiac Monitoring:  The patient was maintained on a cardiac monitor.  I personally viewed and interpreted the cardiac monitored which showed an underlying rhythm of: Normal sinus rhythm   Medicines ordered and prescription drug management:  I ordered medication including Levaquin and Flagyl for sepsis, and packed red blood cells for anemia Reevaluation of the patient after these medicines showed that the patient improved I have reviewed the patients home medicines and have made adjustments as needed   Test Considered:  CT abdomen   Critical Interventions:  Transfusing 2 units of blood   Consultations Obtained:  I requested consultation with the critical care,  and discussed lab and imaging findings as well as pertinent plan - they recommend: They agree with admission to [facility] under the critical care service   Problem List / ED Course:  Sepsis and anemia   Reevaluation:  After the interventions noted above, I reevaluated the patient and found that they have :stayed the same   Social Determinants of Health:  No   Dispostion:  After consideration of the diagnostic results and the patients response to treatment, I feel that the patent would benefit from admission to critical care service and mother,.         Final Clinical Impression(s) / ED Diagnoses Final diagnoses:  Acute sepsis ([place])  Upper GI bleed    Rx / DC Orders ED Discharge Orders     None         Milton Ferguson, MD 04/24/21 [phone]

## 2021-04-22 NOTE — ED Notes (Signed)
[facility] to page Critical care.

## 2021-04-22 NOTE — ED Notes (Signed)
SWAT nurse at bedside for second IV access.

## 2021-04-22 NOTE — Progress Notes (Signed)
Pt being followed by ELink for Sepsis protocol. 

## 2021-04-22 NOTE — ED Notes (Signed)
EDP aware of pt's BP. Pt placed in trendelenburg.

## 2021-04-23 ENCOUNTER — Inpatient Hospital Stay (HOSPITAL_COMMUNITY): Payer: Self-pay

## 2021-04-23 DIAGNOSIS — R9431 Abnormal electrocardiogram [ECG] [EKG]: Secondary | ICD-10-CM

## 2021-04-23 DIAGNOSIS — L8990 Pressure ulcer of unspecified site, unspecified stage: Secondary | ICD-10-CM | POA: Insufficient documentation

## 2021-04-23 DIAGNOSIS — L89152 Pressure ulcer of sacral region, stage 2: Secondary | ICD-10-CM

## 2021-04-23 DIAGNOSIS — K922 Gastrointestinal hemorrhage, unspecified: Secondary | ICD-10-CM

## 2021-04-23 LAB — COMPREHENSIVE METABOLIC PANEL
ALT: 11 U/L (ref 0–44)
AST: 40 U/L (ref 15–41)
Albumin: 1.9 g/dL — ABNORMAL LOW (ref 3.5–5.0)
Alkaline Phosphatase: 96 U/L (ref 38–126)
Anion gap: 15 (ref 5–15)
BUN: 15 mg/dL (ref 6–20)
CO2: 19 mmol/L — ABNORMAL LOW (ref 22–32)
Calcium: 7.1 mg/dL — ABNORMAL LOW (ref 8.9–10.3)
Chloride: 99 mmol/L (ref 98–111)
Creatinine, Ser: 0.8 mg/dL (ref 0.44–1.00)
GFR, Estimated: >60 mL/min (ref >60)
Glucose, Bld: 82 mg/dL (ref 70–99)
Potassium: 2.9 mmol/L — ABNORMAL LOW (ref 3.5–5.1)
Sodium: 133 mmol/L — ABNORMAL LOW (ref 135–145)
Total Bilirubin: 8.6 mg/dL — ABNORMAL HIGH (ref 0.3–1.2)
Total Protein: 4.7 g/dL — ABNORMAL LOW (ref 6.5–8.1)

## 2021-04-23 LAB — TYPE AND SCREEN
ABO/RH(D): A POS
ABO/RH(D): A POS
Antibody Screen: NEGATIVE
Antibody Screen: NEGATIVE
Unit division: 0
Unit division: 0

## 2021-04-23 LAB — CBC
HCT: 25.9 % — ABNORMAL LOW (ref 36.0–46.0)
Hemoglobin: 9 g/dL — ABNORMAL LOW (ref 12.0–15.0)
MCH: 28 pg (ref 26.0–34.0)
MCHC: 34.7 g/dL (ref 30.0–36.0)
MCV: 80.7 fL (ref 80.0–100.0)
Platelets: 100 10*3/uL — ABNORMAL LOW (ref 150–400)
RBC: 3.21 MIL/uL — ABNORMAL LOW (ref 3.87–5.11)
RDW: 15.7 % — ABNORMAL HIGH (ref 11.5–15.5)
WBC: 10.2 10*3/uL (ref 4.0–10.5)
nRBC: 0.3 % — ABNORMAL HIGH (ref 0.0–0.2)

## 2021-04-23 LAB — GLUCOSE, CAPILLARY
Glucose-Capillary: 78 mg/dL (ref 70–99)
Glucose-Capillary: 73 mg/dL (ref 70–99)
Glucose-Capillary: 70 mg/dL (ref 70–99)
Glucose-Capillary: 100 mg/dL — ABNORMAL HIGH (ref 70–99)

## 2021-04-23 LAB — BASIC METABOLIC PANEL
Anion gap: 14 (ref 5–15)
BUN: 14 mg/dL (ref 6–20)
CO2: 21 mmol/L — ABNORMAL LOW (ref 22–32)
Calcium: 7.2 mg/dL — ABNORMAL LOW (ref 8.9–10.3)
Chloride: 98 mmol/L (ref 98–111)
Creatinine, Ser: 0.9 mg/dL (ref 0.44–1.00)
GFR, Estimated: >60 mL/min (ref >60)
Glucose, Bld: 76 mg/dL (ref 70–99)
Potassium: 2.8 mmol/L — ABNORMAL LOW (ref 3.5–5.1)
Sodium: 133 mmol/L — ABNORMAL LOW (ref 135–145)

## 2021-04-23 LAB — URINALYSIS, MICROSCOPIC (REFLEX)
RBC / HPF: >50 RBC/hpf (ref 0–5)
WBC, UA: >50 WBC/hpf (ref 0–5)

## 2021-04-23 LAB — ECHOCARDIOGRAM COMPLETE
Area-P 1/2: 4.36 cm2
Height: 60 in
S' Lateral: 2.4 cm
Weight: 2903.02 oz

## 2021-04-23 LAB — URINALYSIS, ROUTINE W REFLEX MICROSCOPIC
Glucose, UA: NEGATIVE mg/dL
Ketones, ur: NEGATIVE mg/dL
Nitrite: POSITIVE — AB
Protein, ur: 30 mg/dL — AB
Specific Gravity, Urine: 1.025 (ref 1.005–1.030)
pH: 6.5 (ref 5.0–8.0)

## 2021-04-23 LAB — BPAM RBC
Blood Product Expiration Date: 202303182359
Blood Product Expiration Date: 202303182359
ISSUE DATE / TIME: 202302191727
ISSUE DATE / TIME: 202302191947
Unit Type and Rh: 6200
Unit Type and Rh: 6200

## 2021-04-23 LAB — BODY FLUID CELL COUNT WITH DIFFERENTIAL
Eos, Fluid: 0 %
Lymphs, Fluid: 90 %
Monocyte-Macrophage-Serous Fluid: 9 % — ABNORMAL LOW (ref 50–90)
Neutrophil Count, Fluid: 1 % (ref 0–25)
Total Nucleated Cell Count, Fluid: 259 cu mm (ref 0–1000)

## 2021-04-23 LAB — PROTIME-INR
INR: 2 — ABNORMAL HIGH (ref 0.8–1.2)
Prothrombin Time: 22.3 seconds — ABNORMAL HIGH (ref 11.4–15.2)

## 2021-04-23 LAB — ALBUMIN, PLEURAL OR PERITONEAL FLUID: Albumin, Fluid: <1.5 g/dL

## 2021-04-23 LAB — LACTIC ACID, PLASMA: Lactic Acid, Venous: 2.9 mmol/L (ref 0.5–1.9)

## 2021-04-23 LAB — MAGNESIUM: Magnesium: 1.9 mg/dL (ref 1.7–2.4)

## 2021-04-23 LAB — PHOSPHORUS: Phosphorus: 2.5 mg/dL (ref 2.5–4.6)

## 2021-04-23 LAB — PROCALCITONIN: Procalcitonin: 0.42 ng/mL

## 2021-04-23 LAB — BRAIN NATRIURETIC PEPTIDE: B Natriuretic Peptide: 222.5 pg/mL — ABNORMAL HIGH (ref 0.0–100.0)

## 2021-04-23 LAB — PROTEIN, PLEURAL OR PERITONEAL FLUID: Total protein, fluid: <3 g/dL

## 2021-04-23 MED ORDER — MIDODRINE HCL 5 MG PO TABS
10.0000 mg | ORAL_TABLET | Freq: Three times a day (TID) | ORAL | Status: DC
  Administered 2021-04-23 – 2021-04-24 (×3): 10 mg via ORAL
  Filled 2021-04-23 (×3): qty 2

## 2021-04-23 MED ORDER — OXYCODONE HCL 5 MG PO TABS
5.0000 mg | ORAL_TABLET | Freq: Once | ORAL | Status: AC | PRN
  Administered 2021-04-23: 5 mg via ORAL
  Filled 2021-04-23: qty 1

## 2021-04-23 MED ORDER — CHLORHEXIDINE GLUCONATE CLOTH 2 % EX PADS
6.0000 | MEDICATED_PAD | Freq: Every day | CUTANEOUS | Status: DC
  Administered 2021-04-23 – 2021-04-26 (×5): 6 via TOPICAL

## 2021-04-23 MED ORDER — ADULT MULTIVITAMIN W/MINERALS CH
1.0000 | ORAL_TABLET | Freq: Every day | ORAL | Status: DC
  Administered 2021-04-28 – 2021-05-10 (×12): 1 via ORAL
  Filled 2021-04-23 (×18): qty 1

## 2021-04-23 MED ORDER — NOREPINEPHRINE 4 MG/250ML-% IV SOLN
0.0000 ug/min | INTRAVENOUS | Status: DC
  Administered 2021-04-23: 3 ug/min via INTRAVENOUS
  Filled 2021-04-23 (×2): qty 250

## 2021-04-23 MED ORDER — SALINE SPRAY 0.65 % NA SOLN
1.0000 | NASAL | Status: DC | PRN
  Filled 2021-04-23: qty 44

## 2021-04-23 MED ORDER — SODIUM CHLORIDE 0.9 % IV SOLN: INTRAVENOUS | Status: DC | PRN

## 2021-04-23 MED ORDER — POTASSIUM CHLORIDE CRYS ER 20 MEQ PO TBCR
40.0000 meq | EXTENDED_RELEASE_TABLET | Freq: Once | ORAL | Status: AC
  Administered 2021-04-23: 40 meq via ORAL
  Filled 2021-04-23: qty 2

## 2021-04-23 MED ORDER — ALBUMIN HUMAN 25 % IV SOLN
0.0000 g | Freq: Four times a day (QID) | INTRAVENOUS | Status: AC
  Administered 2021-04-23 – 2021-04-24 (×2): 12.5 g via INTRAVENOUS
  Filled 2021-04-23 (×2): qty 400
  Filled 2021-04-23: qty 100

## 2021-04-23 MED ORDER — SODIUM CHLORIDE 0.9 % IV SOLN
1.0000 g | INTRAVENOUS | Status: DC
  Administered 2021-04-23 – 2021-04-28 (×6): 1 g via INTRAVENOUS
  Filled 2021-04-23 (×7): qty 10

## 2021-04-23 MED ORDER — PANTOPRAZOLE SODIUM 40 MG PO TBEC
40.0000 mg | DELAYED_RELEASE_TABLET | Freq: Every day | ORAL | Status: DC
  Administered 2021-04-23 – 2021-05-09 (×16): 40 mg via ORAL
  Filled 2021-04-23 (×17): qty 1

## 2021-04-23 MED ORDER — POTASSIUM CHLORIDE CRYS ER 20 MEQ PO TBCR: 40.0000 meq | EXTENDED_RELEASE_TABLET | Freq: Three times a day (TID) | ORAL | Status: DC

## 2021-04-23 MED ORDER — ONDANSETRON HCL 4 MG/2ML IJ SOLN
4.0000 mg | Freq: Three times a day (TID) | INTRAMUSCULAR | Status: DC | PRN
  Administered 2021-04-23 – 2021-04-30 (×9): 4 mg via INTRAVENOUS
  Filled 2021-04-23 (×9): qty 2

## 2021-04-23 MED ORDER — POTASSIUM CHLORIDE 10 MEQ/100ML IV SOLN
10.0000 meq | INTRAVENOUS | Status: AC
  Administered 2021-04-23 – 2021-04-24 (×5): 10 meq via INTRAVENOUS
  Filled 2021-04-23 (×5): qty 100

## 2021-04-23 MED ORDER — MAGNESIUM SULFATE 2 GM/50ML IV SOLN
2.0000 g | Freq: Once | INTRAVENOUS | Status: AC
  Administered 2021-04-23: 2 g via INTRAVENOUS
  Filled 2021-04-23: qty 50

## 2021-04-23 MED ORDER — ALBUMIN HUMAN 5 % IV SOLN
12.5000 g | Freq: Once | INTRAVENOUS | Status: AC
  Administered 2021-04-23: 12.5 g via INTRAVENOUS
  Filled 2021-04-23: qty 250

## 2021-04-23 MED ORDER — POTASSIUM CHLORIDE 10 MEQ/100ML IV SOLN
10.0000 meq | INTRAVENOUS | Status: AC
  Administered 2021-04-23 (×4): 10 meq via INTRAVENOUS
  Filled 2021-04-23 (×4): qty 100

## 2021-04-23 MED ORDER — ORAL CARE MOUTH RINSE
15.0000 mL | Freq: Two times a day (BID) | OROMUCOSAL | Status: DC
  Administered 2021-04-23 – 2021-05-10 (×32): 15 mL via OROMUCOSAL

## 2021-04-23 MED ORDER — LACTATED RINGERS IV BOLUS
500.0000 mL | Freq: Once | INTRAVENOUS | Status: AC
  Administered 2021-04-23: 500 mL via INTRAVENOUS

## 2021-04-23 MED ORDER — CALCIUM GLUCONATE-NACL 2-0.675 GM/100ML-% IV SOLN
2.0000 g | Freq: Once | INTRAVENOUS | Status: AC
  Administered 2021-04-23: 2000 mg via INTRAVENOUS
  Filled 2021-04-23: qty 100

## 2021-04-23 MED ORDER — NOREPINEPHRINE 4 MG/250ML-% IV SOLN
INTRAVENOUS | Status: AC
  Filled 2021-04-23: qty 250

## 2021-04-23 MED ORDER — ACETAMINOPHEN 325 MG PO TABS
650.0000 mg | ORAL_TABLET | Freq: Four times a day (QID) | ORAL | Status: DC | PRN
  Administered 2021-04-30 – 2021-05-01 (×2): 650 mg via ORAL
  Filled 2021-04-23 (×3): qty 2

## 2021-04-23 MED ORDER — ALBUMIN HUMAN 5 % IV SOLN: 25.0000 g | Freq: Once | INTRAVENOUS | Status: DC

## 2021-04-23 MED ORDER — LACTATED RINGERS IV BOLUS: 1000.0000 mL | Freq: Once | INTRAVENOUS | Status: DC

## 2021-04-23 MED ORDER — OXYCODONE HCL 5 MG PO TABS
5.0000 mg | ORAL_TABLET | Freq: Four times a day (QID) | ORAL | Status: DC | PRN
  Administered 2021-04-23 – 2021-04-29 (×9): 5 mg via ORAL
  Filled 2021-04-23 (×9): qty 1

## 2021-04-23 NOTE — Progress Notes (Signed)
Pts K is 2.8, Ca is 7.2.  Relayed to CCM MD.

## 2021-04-23 NOTE — Progress Notes (Addendum)
Patient received second blood infusion here at [facility] and ended second unit at 22:33 as recorded by me. E Link MD [facility] notified of lactic of 2.9 at 0320 and potassium of 2.9.

## 2021-04-23 NOTE — Procedures (Signed)
Paracentesis Procedure Note  Sharon Lawrence  [id number]  July 02, 1965  Date:04/23/21  Time:4:38 PM   Provider Performing:Francheska Villeda    Procedure: Paracentesis with imaging guidance (63893)  Indication(s) Ascites  Consent Risks of the procedure as well as the alternatives and risks of each were explained to the patient and/or caregiver.  Consent for the procedure was obtained and is signed in the bedside chart  Anesthesia Topical only with 1% lidocaine    Time Out Verified patient identification, verified procedure, site/side was marked, verified correct patient position, special equipment/implants available, medications/allergies/relevant history reviewed, required imaging and test results available.   Sterile Technique Maximal sterile technique including full sterile barrier drape, hand hygiene, sterile gown, sterile gloves, mask, hair covering, sterile ultrasound probe cover (if used).   Procedure Description Ultrasound used to identify appropriate peritoneal anatomy for placement and overlying skin marked.  Area of drainage cleaned and draped in sterile fashion. Lidocaine was used to anesthetize the skin and subcutaneous tissue.  1500 cc's of Dark yellowish appearing fluid was drained. Catheter then removed and bandaid applied to site.   Complications/Tolerance None; patient tolerated the procedure well.   EBL Minimal   Specimen(s) Peritoneal fluid

## 2021-04-23 NOTE — Progress Notes (Signed)
[facility] ADULT ICU REPLACEMENT PROTOCOL   The patient does apply for the [facility] Adult ICU Electrolyte Replacment Protocol based on the criteria listed below:   1.Exclusion criteria: TCTS patients, ECMO patients, and Dialysis patients 2. Is GFR >/= 30 ml/min? Yes.    Patient's GFR today is >60 3. Is SCr </= 2? Yes.   Patient's SCr is 0.80 mg/dL 4. Did SCr increase >/= 0.5 in 24 hours? No. 5.Pt's weight >40kg  Yes.   6. Abnormal electrolyte(s): K, Mag  7. Electrolytes replaced per protocol 8.  Call MD STAT for K+ </= 2.5, Phos </= 1, or Mag </= 1 Physician:  Joette Catching [facility] 04/23/2021 3:27 AM

## 2021-04-23 NOTE — Progress Notes (Signed)
Attempted to see pt for OT eval today. Nursing stated pt is having low BP issues and they are getting ready to place a central line. Will attempt back as schedule allows tomorrow.  Jinger Neighbors, [place] [phone]

## 2021-04-23 NOTE — Consult Note (Signed)
[facility] Nurse Consult Note: Reason for Consult:Stage 2 pressure injury to sacrum noted on admission Wound type:pressure Pressure Injury POA: Yes Measurement:4cm x 2cm x 0.1cm Wound bed:red, yellow fibrinous Drainage (amount, consistency, odor) scant serosanguinous Periwound:blanching erythema Dressing procedure/placement/frequency: Stage 2 pressure injuries are managed by standing skin care order set and include topical care with a silicone foam, and turning and repositioning to minimize time in the supine position (offloading) . I have added an antimicrobial nonadherent as a wound contact layer (xeroform) and bilateral Prevalon Boots for PI prevention of the heels.  [place] nursing team will not follow, but will remain available to this patient, the nursing and medical teams.  Please re-consult if needed. Thanks, Maudie Flakes, MSN, RN, [employer], Arther Abbott  Pager# [phone]

## 2021-04-23 NOTE — Progress Notes (Signed)
eLink Physician-Brief Progress Note Patient Name: Sharon Lawrence DOB: Mar 30, 1965 MRN: [id number]   Date of Service  04/23/2021  HPI/Events of Note  Notified of patient having back pain and nausea Lactic acid 2.9 from 3.6 Repeat K 2.9 Albumin 1.9 BP borderline low, 78/54 (MAP 71)  eICU Interventions  Ordered oxycodone and zofran prn Electrolyte replacement protocol Albumin 5% to be given     Intervention Category Intermediate Interventions: Other:;Hypotension - evaluation and management Minor Interventions: Routine modifications to care plan (e.g. PRN medications for pain, fever)  Shona Needles Kelin Borum 04/23/2021, 3:33 AM

## 2021-04-23 NOTE — Progress Notes (Signed)
PT Cancellation Note  Patient Details Name: Sharon Lawrence MRN: [id number] DOB: [birth year]   Cancelled Treatment:    Reason Eval/Treat Not Completed: (P) Patient not medically ready Pt BP continues to be low. RN request follow back tomorrow for Evaluation.  Jhoel Stieg B. Migdalia Dk PT, DPT Acute Rehabilitation Services Pager [phone] Office [phone]    [place] 04/23/2021, 1:37 PM

## 2021-04-23 NOTE — Progress Notes (Signed)
Dear Doctor: Baltazar Apo. This patient has been identified as a candidate for PICC for the following reason (s): IV therapy over 48 hours, drug pH or osmolality (causing phlebitis, infiltration in 24 hours), drug extravasation potential with tissue necrosis (KCL, Dilantin, Dopamine, CaCl, MgSO4, chemo vesicant), poor veins/poor circulatory system (CHF, COPD, emphysema, diabetes, steroid use, IV drug abuse, etc.), and incompatible drugs (aminophyllin, TPN, heparin, given with an antibiotic) If you agree, please write an order for the indicated device. For any questions contact the Vascular Access Team at [phone] if no answer, please leave a message.  Thank you for supporting the early vascular access assessment program.

## 2021-04-23 NOTE — Progress Notes (Signed)
Initial Nutrition Assessment  DOCUMENTATION CODES:   Not applicable  INTERVENTION:   [employer] breakfast essentials TID with meals. When mixed with milk, each supplement will provide 280 kcal and 13 gm protein.  MVI with minerals daily.  NUTRITION DIAGNOSIS:   Inadequate oral intake related to nausea, vomiting as evidenced by per patient/family report.  GOAL:   Patient will meet greater than or equal to 90% of their needs  MONITOR:   PO intake, Supplement acceptance, Labs, Skin  REASON FOR ASSESSMENT:   Consult Assessment of nutrition requirement/status  ASSESSMENT:   56 yo female admitted with septic vs hypovolemic shock, acute UTI, decompensated liver cirrhosis. PMH  includes CAD, STEMI, celiac disease, GIB, NASH, chronic pain.  Patient very sleepy during RD visit. For the past 2 weeks she has had N/V and has been eating poorly at home. She reports a lot of weight loss. Typically, she tries to include high protein foods with all meals at home. She follows a gluten free diet, avoiding wheat, barley, and rye. She eats gluten free oats. She does not like Ensure or Boost supplements, but does mix [employer] with ice cream to make a high protein milk shake. She agreed to drink [employer] mixed with whole milk TID; RD to send with meals. She was too tired to discuss any further information about her intake at home. She also requested that RD hold off on nutrition focused physical exam at this time.   Labs reviewed. Na 133, K 2.9 CBG: 78-73-70  Medications reviewed and include Protonix, Levophed, IV antibiotics.  Weight history reviewed. Weight has been stable x 4 months, no significant weight changes noted. Volume overload / edema could be masking actual weight loss.   I/O + 1.6 L since admission UOP has been poor: 115 ml so far today Plans for diagnostic and therapeutic paracentesis today.   Stage 2 PI to sacrum present on admission.    NUTRITION - FOCUSED PHYSICAL EXAM:  Deferred d/t patient request  Diet Order:   Diet Order             Diet gluten free Room service appropriate? Yes; Fluid consistency: Thin  Diet effective now                   EDUCATION NEEDS:   Not appropriate for education at this time  Skin:  Skin Assessment: Skin Integrity Issues: Skin Integrity Issues:: Stage II Stage II: sacrum  Last BM:  no BM x 2 weeks per patient  Height:   Ht Readings from Last 1 Encounters:  04/22/21 5' (1.524 m)    Weight:   Wt Readings from Last 1 Encounters:  04/22/21 82.3 kg    BMI:  Body mass index is 35.43 kg/m.  Estimated Nutritional Needs:   Kcal:  2000-2300  Protein:  90-105 gm  Fluid:  1.8-2 L    Lucas Mallow RD, LDN, CNSC Please refer to Amion for contact information.

## 2021-04-23 NOTE — Progress Notes (Signed)
eLink Physician-Brief Progress Note Patient Name: Sharon Lawrence DOB: 05/20/1965 MRN: [id number]   Date of Service  04/23/2021  HPI/Events of Note  BP continues to be borderline Also oliguric Patient appears weak but mentating well  eICU Interventions  Ordered LR bolus and another albumin 5% Bedside CCM team made aware of access issue. Currently does have 2 peripheral lines     Intervention Category Major Interventions: Hypotension - evaluation and management;Hypovolemia - evaluation and treatment with fluids  Judd Lien 04/23/2021, 6:28 AM

## 2021-04-23 NOTE — Progress Notes (Signed)
°  Echocardiogram 2D Echocardiogram has been performed.  Johny Chess 04/23/2021, 2:08 PM

## 2021-04-23 NOTE — Plan of Care (Signed)
Patient careplan implemented.

## 2021-04-23 NOTE — Procedures (Signed)
Central Venous Catheter Insertion Procedure Note  Sharon Lawrence  [id number]  29-Oct-1965  Date:04/23/21  Time:12:04 PM   Provider Performing:Nahmir Zeidman Charleen Kirks   Procedure: Insertion of Non-tunneled Central Venous [phone]) with US guidance ([id number])   Indication(s) Medication administration and Difficult access  Consent Risks of the procedure as well as the alternatives and risks of each were explained to the patient and/or caregiver.  Consent for the procedure was obtained and is signed in the bedside chart  Anesthesia Topical only with 1% lidocaine   Timeout Verified patient identification, verified procedure, site/side was marked, verified correct patient position, special equipment/implants available, medications/allergies/relevant history reviewed, required imaging and test results available.  Sterile Technique Maximal sterile technique including full sterile barrier drape, hand hygiene, sterile gown, sterile gloves, mask, hair covering, sterile ultrasound probe cover (if used).  Procedure Description Area of catheter insertion was cleaned with chlorhexidine and draped in sterile fashion.  With real-time ultrasound guidance a central venous catheter was placed into the left internal jugular vein. Nonpulsatile blood flow and easy flushing noted in all ports.  The catheter was sutured in place and sterile dressing applied.  Complications/Tolerance None; patient tolerated the procedure well. Chest X-ray is ordered to verify placement for internal jugular or subclavian cannulation.   Chest x-ray is not ordered for femoral cannulation.  EBL Minimal  Specimen(s) None

## 2021-04-23 NOTE — Progress Notes (Signed)
NAME:  Sharon Lawrence, MRN:  [id number], DOB:  12-21-1965, LOS: 1 ADMISSION DATE:  04/22/2021, CONSULTATION DATE:  2/19 REFERRING MD:  Roderic Palau, CHIEF COMPLAINT:  nausea and vomiting   History of Present Illness:   Sharon Lawrence, is a 56 y.o. female, who presented to the [facility] ED with a chief complaint of nausea and vomiting. She has a pertinent past medical history of CAD c/b STEMI (2019), celiac disease, GIB, NASH, chronic pain  Prior to arrival, patient reports nausea, vomiting, dizziness, dehydration, low abdominal pain, bilateral leg numbness, over the past week.  ED course was notable for development of hypotension 68/55, heart rate 106, Tmax 97.6 F.  She was started on Levaquin and Flagyl.  Blood cultures were ordered. Afrbrile, WBC 15.5, hemoglobin 6.3, platelets 137, Lactic acid 3.6, Sodium 133, potassium 2.3, chloride 95.  1 unit of PRBCs was ordered.  PCCM was consulted for admission  Pertinent  Medical History  CAD, STEMI (2019), celiac disease, GIB, NASH, chronic pain  Significant Hospital Events: Including procedures, antibiotic start and stop dates in addition to other pertinent events   2/19: [facility]ed [facility] ED, hypotensive, hemoglobin 6.3, 1 unit PRBC, transferred to [facility]  Interim History / Subjective:   Admitted overnight. Borderline hypotension overnight but remain asymptomatic. Minimal urine output at 490 cc in the last 12 hours. Renal function stable however significant hypokalemia. Increased bilirubin at 8.6. Hemoglobin improved to 9.   This AM, Sharon Lawrence endorses pain today that is similar to her chronic pain. She endorses mild abdominal pain that is cramping in nature.  She denies any nausea, vomiting at this time.  She states for approximately 2 weeks prior to admission, she had been experiencing nausea and vomiting that was nonbilious, nonbloody.  During this time.  She was constipated with minimal stool output.  Stool output that she had was nonbloody,  nonmelanotic.  Over the last 24 hours, she developed cramping lower abdominal pain that was concerning for UTI for her.  Objective   Blood pressure (!) 89/52, pulse 90, temperature 98.1 F (36.7 C), temperature source Oral, resp. rate 13, height 5' (1.524 m), weight 82.3 kg, SpO2 100 %.        Intake/Output Summary (Last 24 hours) at 04/23/2021 0728 Last data filed at 04/23/2021 0200 Gross per 24 hour  Intake 1327.95 ml  Output --  Net 1327.95 ml    Filed Weights   04/22/21 1410  Weight: 82.3 kg   Examination: General: Resting in bed; no acute distress.  Appears stated age 26: Dry mucous membranes.  Conjunctiva icteric.  Neuro: Alert and oriented x3. 4/5 strength in bilateral upper extremities. In bilateral lower extremities, she is able to lift against gravity. Unable to assess TDR.  CV: Regular rate and rhythm.  2/6 decrescendo systolic murmur heard best at the left upper sternal border.  No gallops. PULM: Clear to auscultation bilaterally.  No rhonchi, wheezes, rales GI: Soft, nondistended with hypoactive bowel sounds.  No significant tenderness to palpation. Extremities: 2+ pitting edema of the bilateral lower extremities.   Skin: Diffuse petechiae present over bilateral upper extremities and chest  Resolved Hospital Problem list     Assessment & Plan:   Shock, Undifferentiated  Septic versus hypovolemic suspect on admission, however favoring septic at this time. UA consistent with UTI. No urine or blood culture data available. New ascites on ultrasound as additional possible source of infection; will pursue paracentesis this afternoon.   Will need to  monitor UOP closely. At risk for AKI due to hypotension   - Discontinue Levaquin - Start Ceftriaxone.  Pharmacy discussed allergy with patient and patient has consented to trial of cephalosporin - Follow up blood and urine cultures - Will send out peritoneal fluid studies after diagnostic paracentesis  - Follow up  procalcitonin - Strict in/out - Continue Albumin if worsening in hypotension - Start Midodrine 10 mg TID - Will need CVC for difficult access and potential pressor requirement  Hyperbilirubinemia  Chronic LFT elevation  INR elevation History of NAFLD Previously seen by GI while hospitalized in October 2022. Negative work up for viral and autoimmune hepatitis. RUQ ultrasound obtained this admission with nodular appearance consistent with cirrhosis however no biliary abnormalities. Overall, concerning for progression to cirrhosis over the last 3-4 months due to evidence of synthetic hepatic dysfunction.   - Will need outpatient follow up with GI - Will need to start non selective BB once BP stabilizes   Anasarca Elevated BNP  Anasarca like due to cirrhosis however will evaluate for cardiac etiology. Stress test 1 year ago with hyperdynamic LVEF with no perfusion defects noted.   - TTE pending   Chronic bilateral lower extremity weakness Chronic Back Pain 2/2  - Outpatient neurology consultation recommended  - Restart home Oxycodone   Acute electrolyte abnormalities: Hypokalemia, hyponatremia, hypocalcemia Suspect secondary to dehydration.   - Monitor daily and replenish. Goal K > 4 and Mg > 2  Acute on chronic blood loss anemia Thrombocytopenia Appears chronically anemic on baseline labs.  History of GI bleed. Received 1 unit of PRBCs in the emergency department.   - Daily CBC - Monitor for signs of bleeding  History of CAD, STEMI Prolonged QTC, borderline - Holding home Aspirin and anti-hypertensives   Hypoalbuminemia -RD consult  Stage 2 PI, POA - WOC following; appreciate their recommendations   Best Practice (right click and "Reselect all SmartList Selections" daily)   Diet/type: Gluten free diet DVT prophylaxis: SCD GI prophylaxis: PPI Lines: N/A Foley:  N/A Code Status:  full code Last date of multidisciplinary goals of care discussion [pending]  Labs    CBC: Recent Labs  Lab 04/22/21 1509 04/23/21 0220  WBC 15.5* 10.2  NEUTROABS 12.3*  --   HGB 6.3* 9.0*  HCT 17.9* 25.9*  MCV 79.6* 80.7  PLT 137* 100*    Basic Metabolic Panel: Recent Labs  Lab 04/22/21 1509 04/23/21 0220  NA 133* 133*  K 2.3* 2.9*  CL 95* 99  CO2 25 19*  GLUCOSE 98 82  BUN 20 15  CREATININE 0.80 0.80  CALCIUM 7.5* 7.1*  MG  --  1.9  PHOS  --  2.5    GFR: Estimated Creatinine Clearance: 77.3 mL/min (by C-G formula based on SCr of 0.8 mg/dL). Recent Labs  Lab 04/22/21 1509 04/23/21 0220  WBC 15.5* 10.2  LATICACIDVEN 3.6* 2.9*    Liver Function Tests: Recent Labs  Lab 04/22/21 1509 04/23/21 0220  AST 44* 40  ALT 13 11  ALKPHOS 109 96  BILITOT 7.0* 8.6*  PROT 5.2* 4.7*  ALBUMIN 2.3* 1.9*    No results for input(s): LIPASE, AMYLASE in the last 168 hours. No results for input(s): AMMONIA in the last 168 hours.  ABG    Component Value Date/Time   TCO2 24 06/25/2020 1902    Coagulation Profile: Recent Labs  Lab 04/22/21 1509 04/23/21 0220  INR 1.8* 2.0*    Cardiac Enzymes: No results for input(s): CKTOTAL, CKMB, CKMBINDEX, TROPONINI in  the last 168 hours.  HbA1C: Hgb A1c MFr Bld  Date/Time Value Ref Range Status  04/10/2021 04:31 PM CANCELED %     Comment:    [facility] was unable to collect sufficient specimen to perform the following test(s), and is providing the patient with re-collection instructions.          Prediabetes: 5.7 - 6.4          Diabetes: >6.4          Glycemic control for adults with diabetes: <7.0  Result canceled by the ancillary.   09/16/2017 03:58 AM 5.6 4.8 - 5.6 % Final    Comment:    (NOTE)         Prediabetes: 5.7 - 6.4         Diabetes: >6.4         Glycemic control for adults with diabetes: <7.0    CBG: Recent Labs  Lab 04/22/21 2131 04/22/21 2313 04/23/21 0340  GLUCAP 81 77 78    Review of Systems:   Negative except as noted above  Past Medical History:  She,  has a past  medical history of Acute ST elevation myocardial infarction (STEMI) of inferior wall (HCC) (09/15/2017), ADHD (attention deficit hyperactivity disorder), Anxiety, B12 deficiency (02/07/2011), Back pain, CAD (coronary artery disease), Celiac disease, Folic acid deficiency (02/07/2011), GI bleed (11/06/2017), Headache, History of ST elevation myocardial infarction (STEMI) (11/06/2017), Iron deficiency anemia (02/07/2011), Myocardial infarction ([place]) (09/15/2017), and Syncope and collapse (11/06/2017).   Surgical History:   Past Surgical History:  Procedure Laterality Date   BIOPSY  07/12/2020   Procedure: BIOPSY;  Surgeon: Rogene Houston, MD;  Location: [facility];  Service: Endoscopy;;  duodenum(2nd part);antral   CHOLECYSTECTOMY     COLONOSCOPY WITH PROPOFOL N/A 09/26/2017   rehman examined portion of ileum normal, - Five small, non-bleeding polyps in the rectum, in the sigmoid colon and at the hepatic   CORONARY STENT INTERVENTION Right 09/15/2017   Procedure: CORONARY STENT INTERVENTION;  Surgeon: Jettie Booze, MD;  Location: [place] [facility];  Service: Cardiovascular;  Laterality: Right;  RCA   ESOPHAGOGASTRODUODENOSCOPY (EGD) WITH PROPOFOL N/A 07/12/2020   rehman: normal hypopharynx, Normal esophagus. z line regular 35 cm from incisors, congested and erythematous mucosa in antrum, mucosal changes in duodenum, very pronounced changes to suggest active celiac disease   KNEE CARTILAGE SURGERY Right    LEFT HEART CATH AND CORONARY ANGIOGRAPHY N/A 09/15/2017   Procedure: LEFT HEART CATH AND CORONARY ANGIOGRAPHY;  Surgeon: Jettie Booze, MD;  Location: [place] [facility];  Service: Cardiovascular;  Laterality: N/A;   TONSILLECTOMY      Social History:   reports that she quit smoking about 4 years ago. Her smoking use included cigarettes. She has a 3.00 pack-year smoking history. She has never used smokeless tobacco. She reports that she does not drink alcohol and does not use drugs.    Family History:  Her family history includes Anxiety disorder in her sister; Bipolar disorder in her son; CAD in her maternal grandfather and paternal grandmother; Cancer in her mother and sister; Cirrhosis in her paternal grandfather; Depression in her sister; Diabetes in her paternal grandmother; Drug abuse in her son; Hypertension in her father and mother; Hypothyroidism in her father and paternal grandmother.   Allergies Allergies  Allergen Reactions   Bee Venom Anaphylaxis   Erythromycin Anaphylaxis   Cephalexin    Neomycin-Bacitracin-Polymyxin  [Bacitracin-Neomycin-Polymyxin] Swelling    Sight of application   Neosporin  Original [Bacitracin-Neomycin-Polymyxin] Swelling    Swelling at site of application   Prednisone    Sulfa Antibiotics Swelling    Sulfa eye drops caused eyes to swell Sulfa eye drops caused eyes to swell   Tobrex [Tobramycin] Swelling   Tramadol Hives   Penicillins Rash    Has patient had a PCN reaction causing immediate rash, facial/tongue/throat swelling, SOB or lightheadedness with hypotension: Yes Has patient had a PCN reaction causing severe rash involving mucus membranes or skin necrosis: No Has patient had a PCN reaction that required hospitalization: No Has patient had a PCN reaction occurring within the last 10 years: No If all of the above answers are "NO", then may proceed with Cephalosporin use.     Home Medications  Prior to Admission medications   Medication Sig Start Date End Date Taking? Authorizing Provider  famotidine (PEPCID) 40 MG tablet Take 1 tablet (40 mg total) by mouth at bedtime. 10/26/20  Yes Rehman, Mechele Dawley, MD  ketoconazole (NIZORAL) 2 % cream Apply topically 2 (two) times daily. 12/16/20  Yes Johnson, Eldridge Dace, MD  levonorgestrel (MIRENA) 20 MCG/24HR IUD by Intrauterine route.   Yes [provider]  nitroGLYCERIN (NITROLINGUAL) 0.4 MG/SPRAY spray Place 2 sprays under the tongue every 5 (five) minutes x 3 doses as  needed. 04/03/20  Yes [provider]  nystatin (MYCOSTATIN/NYSTOP) powder Apply topically 3 (three) times daily as needed (yeast infection). 12/16/20  Yes Johnson, Clanford L, MD  omeprazole (PRILOSEC) 40 MG capsule Take 1 capsule (40 mg total) by mouth daily. 11/28/20  Yes Carlan, Chelsea L, NP  ondansetron (ZOFRAN-ODT) 8 MG disintegrating tablet Take 1 tablet (8 mg total) by mouth 3 (three) times daily as needed for nausea. 07/14/20  Yes Kathie Dike, MD  potassium chloride (KLOR-CON) 10 MEQ tablet Take 10 meq daily when you take Torsemide 12/28/20  Yes Strader, [place], PA-C  pregabalin (LYRICA) 75 MG capsule Take 1 capsule (75 mg total) by mouth 2 (two) times daily. 04/16/21  Yes Lindell Spar, MD  torsemide (DEMADEX) 20 MG tablet Take 1 tablet (20 mg total) by mouth daily as needed (edema). As needed. 12/16/20  Yes Johnson, Clanford L, MD  aspirin 81 MG chewable tablet Chew 1 tablet (81 mg total) by mouth daily. Patient not taking: Reported on 04/22/2021 09/18/17   Reino Bellis B, NP  loperamide (IMODIUM) 2 MG capsule Take 1 capsule (2 mg total) by mouth 3 (three) times daily as needed for diarrhea or loose stools. 07/14/20   Kathie Dike, MD  metoCLOPramide (REGLAN) 10 MG tablet Take 1 tablet (10 mg total) by mouth every 8 (eight) hours as needed for nausea. 12/16/20   Johnson, Clanford L, MD  metoprolol succinate (TOPROL XL) 25 MG 24 hr tablet Take 1 tablet (25 mg total) by mouth daily. 12/27/20   Strader, Fransisco Hertz, PA-C  oxyCODONE (OXY IR/ROXICODONE) 5 MG immediate release tablet Take 1 tablet (5 mg total) by mouth in the morning and at bedtime for 7 days, THEN 1 tablet (5 mg total) daily for 7 days. Patient not taking: Reported on 04/05/2021 01/16/21 01/30/21  Milinda Pointer, MD  prochlorperazine (COMPAZINE) 10 MG tablet TAKE (1) TABLET BY MOUTH EVERY SIX HOURS AS NEEDED NAUSEA OR VOMITING. Patient taking differently: Take 10 mg by mouth every 6 (six) hours as needed for  nausea or vomiting. 10/10/20   Rehman, Mechele Dawley, MD  rizatriptan (MAXALT) 10 MG tablet Take 10 mg by mouth as needed. 12/31/19   [provider]  tiZANidine (ZANAFLEX) 4 MG tablet Take 1 tablet (4 mg total) by mouth every 8 (eight) hours as needed for muscle spasms. 01/11/21   Lindell Spar, MD  Vitamin D, Ergocalciferol, (DRISDOL) 1.25 MG (50000 UT) CAPS capsule Take 50,000 Units by mouth every 7 (seven) days. Wednesday 09/14/18   [provider]   Dr. Jose Persia Internal Medicine PGY-3  04/23/2021, 7:31 AM

## 2021-04-24 ENCOUNTER — Ambulatory Visit (INDEPENDENT_AMBULATORY_CARE_PROVIDER_SITE_OTHER): Payer: Self-pay | Admitting: Gastroenterology

## 2021-04-24 ENCOUNTER — Encounter (INDEPENDENT_AMBULATORY_CARE_PROVIDER_SITE_OTHER): Payer: Self-pay | Admitting: Gastroenterology

## 2021-04-24 LAB — URINE CULTURE: Culture: >=100000 — AB

## 2021-04-24 LAB — COMPREHENSIVE METABOLIC PANEL
ALT: 9 U/L (ref 0–44)
AST: 32 U/L (ref 15–41)
Albumin: 2.5 g/dL — ABNORMAL LOW (ref 3.5–5.0)
Alkaline Phosphatase: 85 U/L (ref 38–126)
Anion gap: 10 (ref 5–15)
BUN: 12 mg/dL (ref 6–20)
CO2: 23 mmol/L (ref 22–32)
Calcium: 7.9 mg/dL — ABNORMAL LOW (ref 8.9–10.3)
Chloride: 98 mmol/L (ref 98–111)
Creatinine, Ser: 0.85 mg/dL (ref 0.44–1.00)
GFR, Estimated: >60 mL/min (ref >60)
Glucose, Bld: 94 mg/dL (ref 70–99)
Potassium: 3.5 mmol/L (ref 3.5–5.1)
Sodium: 131 mmol/L — ABNORMAL LOW (ref 135–145)
Total Bilirubin: 7.2 mg/dL — ABNORMAL HIGH (ref 0.3–1.2)
Total Protein: 4.8 g/dL — ABNORMAL LOW (ref 6.5–8.1)

## 2021-04-24 LAB — CBC WITH DIFFERENTIAL/PLATELET
Abs Immature Granulocytes: 0.08 10*3/uL — ABNORMAL HIGH (ref 0.00–0.07)
Basophils Absolute: 0 10*3/uL (ref 0.0–0.1)
Basophils Relative: 0 %
Eosinophils Absolute: 0.2 10*3/uL (ref 0.0–0.5)
Eosinophils Relative: 2 %
HCT: 20.6 % — ABNORMAL LOW (ref 36.0–46.0)
Hemoglobin: 7.3 g/dL — ABNORMAL LOW (ref 12.0–15.0)
Immature Granulocytes: 1 %
Lymphocytes Relative: 24 %
Lymphs Abs: 2.5 10*3/uL (ref 0.7–4.0)
MCH: 28.3 pg (ref 26.0–34.0)
MCHC: 35.4 g/dL (ref 30.0–36.0)
MCV: 79.8 fL — ABNORMAL LOW (ref 80.0–100.0)
Monocytes Absolute: 0.6 10*3/uL (ref 0.1–1.0)
Monocytes Relative: 6 %
Neutro Abs: 7.1 10*3/uL (ref 1.7–7.7)
Neutrophils Relative %: 67 %
Platelets: 106 10*3/uL — ABNORMAL LOW (ref 150–400)
RBC: 2.58 MIL/uL — ABNORMAL LOW (ref 3.87–5.11)
RDW: 16.3 % — ABNORMAL HIGH (ref 11.5–15.5)
WBC: 10.4 10*3/uL (ref 4.0–10.5)
nRBC: 0 % (ref 0.0–0.2)

## 2021-04-24 LAB — PROTIME-INR
INR: 1.9 — ABNORMAL HIGH (ref 0.8–1.2)
INR: 1.9 — ABNORMAL HIGH (ref 0.8–1.2)
Prothrombin Time: 22 seconds — ABNORMAL HIGH (ref 11.4–15.2)
Prothrombin Time: 21.8 seconds — ABNORMAL HIGH (ref 11.4–15.2)

## 2021-04-24 LAB — MAGNESIUM: Magnesium: 2.4 mg/dL (ref 1.7–2.4)

## 2021-04-24 LAB — GLUCOSE, CAPILLARY: Glucose-Capillary: 91 mg/dL (ref 70–99)

## 2021-04-24 LAB — PROCALCITONIN: Procalcitonin: 0.43 ng/mL

## 2021-04-24 LAB — CYTOLOGY - NON PAP

## 2021-04-24 MED ORDER — SODIUM CHLORIDE 0.9% FLUSH
10.0000 mL | Freq: Two times a day (BID) | INTRAVENOUS | Status: DC
  Administered 2021-04-24: 20 mL
  Administered 2021-04-24 – 2021-04-25 (×2): 10 mL

## 2021-04-24 MED ORDER — LACTULOSE 10 GM/15ML PO SOLN
20.0000 g | Freq: Two times a day (BID) | ORAL | Status: DC
  Administered 2021-04-24 – 2021-04-26 (×5): 20 g via ORAL
  Filled 2021-04-24 (×5): qty 30

## 2021-04-24 MED ORDER — POTASSIUM CHLORIDE 20 MEQ PO PACK
40.0000 meq | PACK | Freq: Three times a day (TID) | ORAL | Status: AC
  Administered 2021-04-24: 40 meq via ORAL
  Filled 2021-04-24: qty 2

## 2021-04-24 MED ORDER — OLANZAPINE 5 MG PO TABS
5.0000 mg | ORAL_TABLET | Freq: Two times a day (BID) | ORAL | Status: DC
  Administered 2021-04-24 – 2021-04-30 (×12): 5 mg via ORAL
  Filled 2021-04-24 (×13): qty 1

## 2021-04-24 MED ORDER — SODIUM CHLORIDE 0.9% FLUSH: 10.0000 mL | INTRAVENOUS | Status: DC | PRN

## 2021-04-24 MED ORDER — MIDODRINE HCL 5 MG PO TABS
15.0000 mg | ORAL_TABLET | Freq: Three times a day (TID) | ORAL | Status: DC
  Administered 2021-04-24 – 2021-05-05 (×33): 15 mg via ORAL
  Filled 2021-04-24 (×33): qty 3

## 2021-04-24 NOTE — Progress Notes (Signed)
NAME:  Sharon Lawrence, MRN:  [id number], DOB:  04/09/1965, LOS: 2 ADMISSION DATE:  04/22/2021, CONSULTATION DATE:  2/19 REFERRING MD:  Roderic Palau, CHIEF COMPLAINT:  nausea and vomiting   History of Present Illness:   Sharon Lawrence, is a 56 y.o. female, who presented to the [facility] ED with a chief complaint of nausea and vomiting. She has a pertinent past medical history of CAD c/b STEMI (2019), celiac disease, GIB, NASH, chronic pain  Prior to arrival, patient reports nausea, vomiting, dizziness, dehydration, low abdominal pain, bilateral leg numbness, over the past week.  ED course was notable for development of hypotension 68/55, heart rate 106, Tmax 97.6 F.  She was started on Levaquin and Flagyl.  Blood cultures were ordered. Afrbrile, WBC 15.5, hemoglobin 6.3, platelets 137, Lactic acid 3.6, Sodium 133, potassium 2.3, chloride 95.  1 unit of PRBCs was ordered.  PCCM was consulted for admission  Pertinent  Medical History  CAD, STEMI (2019), celiac disease, GIB, NASH, chronic pain  Significant Hospital Events: Including procedures, antibiotic start and stop dates in addition to other pertinent events   2/19: Presented to [facility] ED, hypotensive, hemoglobin 6.3, 1 unit PRBC, transferred to [facility]  Interim History / Subjective:   No acute overnight events. Continues to require low dose Levophed between 1-2 mcg/min. MAP between 62 - 88 overnight. Hemoglobin low at 7.3 compared to prior day.   This AM, [name] denies any acute complaints. She notes throat pain over the last 2 weeks due to nausea/vomiting.   Objective   Blood pressure (!) 82/53, pulse 89, temperature 98.6 F (37 C), temperature source Oral, resp. rate (!) 9, height 5' (1.524 m), weight 82.3 kg, SpO2 97 %.        Intake/Output Summary (Last 24 hours) at 04/24/2021 0731 Last data filed at 04/24/2021 0500 Gross per 24 hour  Intake 2056.48 ml  Output 1715 ml  Net 341.48 ml    Filed Weights   04/22/21 1410   Weight: 82.3 kg   Examination: General: Resting in bed; no acute distress.  Appears stated age 33: Moist mucus membranes.  Conjunctiva icteric.  Neuro: Alert and oriented x3.  CV: Regular rate and rhythm.  2/6 decrescendo systolic murmur heard best at the left upper sternal border.  No gallops. PULM: Clear to auscultation bilaterally.  No rhonchi, wheezes, rales GI: Soft, non-distended.  No tenderness to palpation. Extremities: 1+ pitting edema of the bilateral lower extremities.   Skin: Jaundiced. Diffuse petechiae present over bilateral upper extremities and chest, unchanged from prior day.   Resolved Hospital Problem list     Assessment & Plan:   Shock, Undifferentiated  Septic versus hypovolemic suspect on admission, however favoring septic at this time. UA consistent with UTI. Culture with yeast only. Given clinical improvement since admitted, will hold off on addition of Fluconazole. Peritoneal fluid analysis not consistent with SBP.   - Levophed PRN to maintain MAP > 65. Wean as tolerated - Increase Midodrine to 15 mg TID - Continue Ceftriaxone - Consider addition of Fluconazole should there be any sign of clinical decompensation  - Follow up blood (NGTD x1 day) - Strict in/out - Albumin if worsening in hypotension  Suspected Decompensated Cirrhosis w/ Ascites  History of NAFLD Previously seen by GI while hospitalized in October 2022. Negative work up for viral and autoimmune hepatitis. RUQ ultrasound obtained this admission with nodular appearance consistent with cirrhosis however no biliary abnormalities to suggest PBS. Overall, concerning for  progression to cirrhosis over the last 3-4 months due to evidence of synthetic hepatic dysfunction. SAAG score > 1.1 consistent with portal hypertension as etiology of ascites, likely due to cirrhosis given lack of other risk factors, including heart failure. MELD-Na score 25.   MRI in October with numerous hyperdense lesions. Will  need dedicated MRI eventually.   - Consult GI today - Will need to start non selective BB once BP stabilizes  - AFP tumor marker ordered - Will add on Lactulose as recent N/V with weakness may be an aspect of encephalopathy. Titrate to 2-3 bowel movements per day  Anasarca Elevated BNP  Anasarca like due to cirrhosis however will evaluate for cardiac etiology. Stress test 1 year ago with hyperdynamic LVEF with no perfusion defects noted. TTE without evidence of systolic or diastolic dysfunction. No valvular abnormalities.   Chronic bilateral lower extremity weakness Chronic Back Pain 2/2  - Outpatient neurology consultation recommended - Restart home Oxycodone  - PT/OT evaluation   Acute electrolyte abnormalities: Hypokalemia, hyponatremia, hypocalcemia Suspect secondary to dehydration, acute illness. Corrected calcium 9.1.   - Monitor daily and replenish. Goal K > 4 and Mg > 2  Acute on Chronic Microcytic Anemia Acute Thrombocytopenia Chronically anemic with previous history of IDA but most recent iron panel in 12/2020 consistent with anemia of chronic disease.  History of GI bleed in 2019 due to [place]. Received 1 unit of PRBCs in the emergency department.   - Daily CBC - Monitor for signs of bleeding  History of CAD, STEMI s/p PCI to the RCA Prolonged QTC, borderline - Holding home Aspirin and anti-hypertensives   Hypoalbuminemia - Multifactorial in the setting of poor PO intake, suspected cirrhosis and current infection - RD consulted; appreciate their recommendations   Stage 2 PI, POA - WOC following; appreciate their recommendations   History of Celiac Disease - Chronically elevated tTG suspected to be 2/2 dietary non-compliance. Most recent in 12/2020 - Gluten free diet   Un-intentional Weight Loss Occurred over the last year. Coincides with increasing immobilization due to generalized lower extremity weakness. Will need age appropriate cancer screening: overdue  her Pap smear   - RD consulted; appreciate their recommendations   Best Practice (right click and "Reselect all SmartList Selections" daily)   Diet/type: Gluten free diet DVT prophylaxis: SCD GI prophylaxis: PPI Lines: N/A Foley:  N/A Code Status:  full code Last date of multidisciplinary goals of care discussion [Patient updated at bedside 2/21]  Labs   CBC: Recent Labs  Lab 04/22/21 1509 04/23/21 0220 04/24/21 0433  WBC 15.5* 10.2 10.4  NEUTROABS 12.3*  --  7.1  HGB 6.3* 9.0* 7.3*  HCT 17.9* 25.9* 20.6*  MCV 79.6* 80.7 79.8*  PLT 137* 100* 106*    Basic Metabolic Panel: Recent Labs  Lab 04/22/21 1509 04/23/21 0220 04/23/21 1250 04/24/21 0433  NA 133* 133* 133* 131*  K 2.3* 2.9* 2.8* 3.5  CL 95* 99 98 98  CO2 25 19* 21* 23  GLUCOSE 98 82 76 94  BUN 20 15 14 12   CREATININE 0.80 0.80 0.90 0.85  CALCIUM 7.5* 7.1* 7.2* 7.9*  MG  --  1.9  --   --   PHOS  --  2.5  --   --     GFR: Estimated Creatinine Clearance: 72.7 mL/min (by C-G formula based on SCr of 0.85 mg/dL). Recent Labs  Lab 04/22/21 1509 04/23/21 0220 04/23/21 1229 04/24/21 0433  PROCALCITON  --   --  0.42 0.43  WBC 15.5* 10.2  --  10.4  LATICACIDVEN 3.6* 2.9*  --   --     Liver Function Tests: Recent Labs  Lab 04/22/21 1509 04/23/21 0220 04/24/21 0433  AST 44* 40 32  ALT 13 11 9   ALKPHOS 109 96 85  BILITOT 7.0* 8.6* 7.2*  PROT 5.2* 4.7* 4.8*  ALBUMIN 2.3* 1.9* 2.5*    No results for input(s): LIPASE, AMYLASE in the last 168 hours. No results for input(s): AMMONIA in the last 168 hours.  ABG    Component Value Date/Time   TCO2 24 06/25/2020 1902    Coagulation Profile: Recent Labs  Lab 04/22/21 1509 04/23/21 0220 04/24/21 0433  INR 1.8* 2.0* 1.9*    Cardiac Enzymes: No results for input(s): CKTOTAL, CKMB, CKMBINDEX, TROPONINI in the last 168 hours.  HbA1C: Hgb A1c MFr Bld  Date/Time Value Ref Range Status  04/10/2021 04:31 PM CANCELED %     Comment:    [facility]  was unable to collect sufficient specimen to perform the following test(s), and is providing the patient with re-collection instructions.          Prediabetes: 5.7 - 6.4          Diabetes: >6.4          Glycemic control for adults with diabetes: <7.0  Result canceled by the ancillary.   09/16/2017 03:58 AM 5.6 4.8 - 5.6 % Final    Comment:    (NOTE)         Prediabetes: 5.7 - 6.4         Diabetes: >6.4         Glycemic control for adults with diabetes: <7.0    CBG: Recent Labs  Lab 04/22/21 2313 04/23/21 0340 04/23/21 0817 04/23/21 1208 04/23/21 1727  GLUCAP 77 78 73 70 100*    Review of Systems:   Negative except as noted above  Past Medical History:  She,  has a past medical history of Acute ST elevation myocardial infarction (STEMI) of inferior wall ([place]) (09/15/2017), ADHD (attention deficit hyperactivity disorder), Anxiety, B12 deficiency (02/07/2011), Back pain, CAD (coronary artery disease), Celiac disease, Folic acid deficiency (02/07/2011), GI bleed (11/06/2017), Headache, History of ST elevation myocardial infarction (STEMI) (11/06/2017), Iron deficiency anemia (02/07/2011), Myocardial infarction ([place]) (09/15/2017), and Syncope and collapse (11/06/2017).   Surgical History:   Past Surgical History:  Procedure Laterality Date   BIOPSY  07/12/2020   Procedure: BIOPSY;  Surgeon: Rogene Houston, MD;  Location: [facility];  Service: Endoscopy;;  duodenum(2nd part);antral   CHOLECYSTECTOMY     COLONOSCOPY WITH PROPOFOL N/A 09/26/2017   rehman examined portion of ileum normal, - Five small, non-bleeding polyps in the rectum, in the sigmoid colon and at the hepatic   CORONARY STENT INTERVENTION Right 09/15/2017   Procedure: CORONARY STENT INTERVENTION;  Surgeon: Jettie Booze, MD;  Location: [place] [facility];  Service: Cardiovascular;  Laterality: Right;  RCA   ESOPHAGOGASTRODUODENOSCOPY (EGD) WITH PROPOFOL N/A 07/12/2020   rehman: normal hypopharynx, Normal  esophagus. z line regular 35 cm from incisors, congested and erythematous mucosa in antrum, mucosal changes in duodenum, very pronounced changes to suggest active celiac disease   KNEE CARTILAGE SURGERY Right    LEFT HEART CATH AND CORONARY ANGIOGRAPHY N/A 09/15/2017   Procedure: LEFT HEART CATH AND CORONARY ANGIOGRAPHY;  Surgeon: Jettie Booze, MD;  Location: [place] [facility];  Service: Cardiovascular;  Laterality: N/A;   TONSILLECTOMY      Social History:  reports that she quit smoking about 4 years ago. Her smoking use included cigarettes. She has a 3.00 pack-year smoking history. She has never used smokeless tobacco. She reports that she does not drink alcohol and does not use drugs.   Family History:  Her family history includes Anxiety disorder in her sister; Bipolar disorder in her son; CAD in her maternal grandfather and paternal grandmother; Cancer in her mother and sister; Cirrhosis in her paternal grandfather; Depression in her sister; Diabetes in her paternal grandmother; Drug abuse in her son; Hypertension in her father and mother; Hypothyroidism in her father and paternal grandmother.   Allergies Allergies  Allergen Reactions   Bee Venom Anaphylaxis   Erythromycin Anaphylaxis   Cephalexin    Neomycin-Bacitracin-Polymyxin  [Bacitracin-Neomycin-Polymyxin] Swelling    Sight of application   Neosporin Original [Bacitracin-Neomycin-Polymyxin] Swelling    Swelling at site of application   Prednisone    Sulfa Antibiotics Swelling    Sulfa eye drops caused eyes to swell Sulfa eye drops caused eyes to swell   Tobrex [Tobramycin] Swelling   Tramadol Hives   Penicillins Rash    Has patient had a PCN reaction causing immediate rash, facial/tongue/throat swelling, SOB or lightheadedness with hypotension: Yes Has patient had a PCN reaction causing severe rash involving mucus membranes or skin necrosis: No Has patient had a PCN reaction that required hospitalization: No Has  patient had a PCN reaction occurring within the last 10 years: No If all of the above answers are "NO", then may proceed with Cephalosporin use.     Home Medications  Prior to Admission medications   Medication Sig Start Date End Date Taking? Authorizing Provider  famotidine (PEPCID) 40 MG tablet Take 1 tablet (40 mg total) by mouth at bedtime. 10/26/20  Yes Rehman, Mechele Dawley, MD  ketoconazole (NIZORAL) 2 % cream Apply topically 2 (two) times daily. 12/16/20  Yes Johnson, Eldridge Dace, MD  levonorgestrel (MIRENA) 20 MCG/24HR IUD by Intrauterine route.   Yes [provider]  nitroGLYCERIN (NITROLINGUAL) 0.4 MG/SPRAY spray Place 2 sprays under the tongue every 5 (five) minutes x 3 doses as needed. 04/03/20  Yes [provider]  nystatin (MYCOSTATIN/NYSTOP) powder Apply topically 3 (three) times daily as needed (yeast infection). 12/16/20  Yes Johnson, Clanford L, MD  omeprazole (PRILOSEC) 40 MG capsule Take 1 capsule (40 mg total) by mouth daily. 11/28/20  Yes Carlan, Chelsea L, NP  ondansetron (ZOFRAN-ODT) 8 MG disintegrating tablet Take 1 tablet (8 mg total) by mouth 3 (three) times daily as needed for nausea. 07/14/20  Yes Kathie Dike, MD  potassium chloride (KLOR-CON) 10 MEQ tablet Take 10 meq daily when you take Torsemide 12/28/20  Yes Strader, Irene, PA-C  pregabalin (LYRICA) 75 MG capsule Take 1 capsule (75 mg total) by mouth 2 (two) times daily. 04/16/21  Yes Lindell Spar, MD  torsemide (DEMADEX) 20 MG tablet Take 1 tablet (20 mg total) by mouth daily as needed (edema). As needed. 12/16/20  Yes Johnson, Clanford L, MD  aspirin 81 MG chewable tablet Chew 1 tablet (81 mg total) by mouth daily. Patient not taking: Reported on 04/22/2021 09/18/17   Reino Bellis B, NP  loperamide (IMODIUM) 2 MG capsule Take 1 capsule (2 mg total) by mouth 3 (three) times daily as needed for diarrhea or loose stools. 07/14/20   Kathie Dike, MD  metoCLOPramide (REGLAN) 10 MG tablet Take  1 tablet (10 mg total) by mouth every 8 (eight) hours as needed for nausea. 12/16/20  Johnson, Clanford L, MD  metoprolol succinate (TOPROL XL) 25 MG 24 hr tablet Take 1 tablet (25 mg total) by mouth daily. 12/27/20   Strader, Fransisco Hertz, PA-C  oxyCODONE (OXY IR/ROXICODONE) 5 MG immediate release tablet Take 1 tablet (5 mg total) by mouth in the morning and at bedtime for 7 days, THEN 1 tablet (5 mg total) daily for 7 days. Patient not taking: Reported on 04/05/2021 01/16/21 01/30/21  Milinda Pointer, MD  prochlorperazine (COMPAZINE) 10 MG tablet TAKE (1) TABLET BY MOUTH EVERY SIX HOURS AS NEEDED NAUSEA OR VOMITING. Patient taking differently: Take 10 mg by mouth every 6 (six) hours as needed for nausea or vomiting. 10/10/20   Rehman, Mechele Dawley, MD  rizatriptan (MAXALT) 10 MG tablet Take 10 mg by mouth as needed. 12/31/19   [provider]  tiZANidine (ZANAFLEX) 4 MG tablet Take 1 tablet (4 mg total) by mouth every 8 (eight) hours as needed for muscle spasms. 01/11/21   Lindell Spar, MD  Vitamin D, Ergocalciferol, (DRISDOL) 1.25 MG (50000 UT) CAPS capsule Take 50,000 Units by mouth every 7 (seven) days. Wednesday 09/14/18   [provider]   Dr. Jose Persia Internal Medicine PGY-3  04/24/2021, 7:31 AM

## 2021-04-24 NOTE — Evaluation (Signed)
Occupational Therapy Evaluation Patient Details Name: Sharon Lawrence MRN: [id number] DOB: 1965/09/25 Today's Date: 04/24/2021   History of Present Illness Pt presented to [facility] ED for nausea and vomiting on 2/19. Pt with septic shock and UTI. PMH - STEMI, ADHD, anxiety, chronic pain, CAD, Celiac disease,   Clinical Impression   Pt admitted with the above diagnoses and presents with below problem list. Pt will benefit from continued acute OT to address the below listed deficits and maximize independence with basic ADLs prior to d/c home. PTA, pt reports needing assist with functional transfers and bathing/dressing. Pt currently needs up to mod A +2 for LB ADLs in sit<>stand position and to come to standing position briefly. Pt up in recliner at end of session.        Recommendations for follow up therapy are one component of a multi-disciplinary discharge planning process, led by the attending physician.  Recommendations may be updated based on patient status, additional functional criteria and insurance authorization.   Follow Up Recommendations  Home health OT    Assistance Recommended at Discharge Frequent or constant Supervision/Assistance  Patient can return home with the following A lot of help with walking and/or transfers;A lot of help with bathing/dressing/bathroom;Assist for transportation    Functional Status Assessment  Patient has had a recent decline in their functional status and demonstrates the ability to make significant improvements in function in a reasonable and predictable amount of time.  Equipment Recommendations  None recommended by OT    Recommendations for Other Services       Precautions / Restrictions Precautions Precautions: Fall Restrictions Weight Bearing Restrictions: No      Mobility Bed Mobility Overal bed mobility: Needs Assistance Bed Mobility: Supine to Sit     Supine to sit: Min assist, HOB elevated     General bed mobility comments: assist  to bring legs off of bed, elevate trunk into sitting and bring hips to EOB    Transfers Overall transfer level: Needs assistance Equipment used: Rolling walker (2 wheels), Ambulation equipment used Transfers: Sit to/from Stand, Bed to chair/wheelchair/BSC Sit to Stand: +2 physical assistance, Mod assist           General transfer comment: Stood from bed with Stedy with assist to bring hips up. On first attempt unable to rise. On second attempt came to stand. From recliner stood with walker with assist to bring hips up. On first attempt unable to rise. On second attempt able to come to stand. Transfer via Lift Equipment: Stedy    Balance Overall balance assessment: Needs assistance Sitting-balance support: No upper extremity supported, Feet supported Sitting balance-Leahy Scale: Fair     Standing balance support: Bilateral upper extremity supported Standing balance-Leahy Scale: Poor Standing balance comment: walker or stedy and +2 min assist for static standing                           ADL either performed or assessed with clinical judgement   ADL Overall ADL's : Needs assistance/impaired Eating/Feeding: Set up   Grooming: Set up   Upper Body Bathing: Moderate assistance   Lower Body Bathing: +2 for physical assistance;Sit to/from stand;Moderate assistance   Upper Body Dressing : Moderate assistance   Lower Body Dressing: Moderate assistance;+2 for physical assistance;Sit to/from stand                 General ADL Comments: Transferred EOB to recliner using Stedy then stood from [employer]  1x with +2 HHA.     Vision         Perception     Praxis      Pertinent Vitals/Pain Pain Assessment Pain Assessment: 0-10 Pain Score: 6  Pain Location: back and neck Pain Descriptors / Indicators: Aching, Sore Pain Intervention(s): Limited activity within patient's tolerance     Hand Dominance Right   Extremity/Trunk Assessment Upper Extremity  Assessment Upper Extremity Assessment: Generalized weakness   Lower Extremity Assessment Lower Extremity Assessment: Defer to PT evaluation       Communication Communication Communication: No difficulties   Cognition Arousal/Alertness: Awake/alert Behavior During Therapy: WFL for tasks assessed/performed Overall Cognitive Status: Within Functional Limits for tasks assessed                                       General Comments  VSS on RA    Exercises     Shoulder Instructions      Home Living Family/patient expects to be discharged to:: Private residence Living Arrangements: Other relatives (daughter and sister) Available Help at Discharge: Family;Available 24 hours/day Type of Home: House Home Access: Level entry     Home Layout: One level     Bathroom Shower/Tub: Occupational psychologist: Handicapped height     Home Equipment: Conservation officer, nature (2 wheels);Cane - single point;Shower seat;BSC/3in1;Wheelchair - manual          Prior Functioning/Environment Prior Level of Function : Needs assist       Physical Assist : Mobility (physical);ADLs (physical)     Mobility Comments: Bed to bsc or w/c with assist of 1 family member ADLs Comments: Assist with ADLS        OT Problem List: Impaired balance (sitting and/or standing);Decreased knowledge of use of DME or AE;Decreased knowledge of precautions;Pain;Decreased activity tolerance;Decreased strength      OT Treatment/Interventions: Self-care/ADL training;Therapeutic exercise;Energy conservation;DME and/or AE instruction;Therapeutic activities;Patient/family education;Balance training    OT Goals(Current goals can be found in the care plan section) Acute Rehab OT Goals Patient Stated Goal: not stated. OT Goal Formulation: With patient Time For Goal Achievement: 05/08/21 Potential to Achieve Goals: Good ADL Goals Pt Will Perform Grooming: with min assist;standing Pt Will Perform  Lower Body Bathing: with mod assist;sit to/from stand Pt Will Perform Lower Body Dressing: with mod assist;sit to/from stand Pt Will Transfer to Toilet: with mod assist;stand pivot transfer Pt Will Perform Toileting - Clothing Manipulation and hygiene: with mod assist;sit to/from stand  OT Frequency: Min 2X/week    Co-evaluation PT/OT/SLP Co-Evaluation/Treatment: Yes Reason for Co-Treatment: For patient/therapist safety   OT goals addressed during session: ADL's and self-care      AM-PAC OT "6 Clicks" Daily Activity     Outcome Measure Help from another person eating meals?: None Help from another person taking care of personal grooming?: A Little Help from another person toileting, which includes using toliet, bedpan, or urinal?: A Lot Help from another person bathing (including washing, rinsing, drying)?: A Lot Help from another person to put on and taking off regular upper body clothing?: A Little Help from another person to put on and taking off regular lower body clothing?: A Lot 6 Click Score: 16   End of Session Equipment Utilized During Treatment: Gait belt Nurse Communication: Mobility status  Activity Tolerance: Patient limited by fatigue Patient left: in chair;with call bell/phone within reach;with chair alarm set  OT  Visit Diagnosis: Unsteadiness on feet (R26.81);Pain;Muscle weakness (generalized) (M62.81);Other abnormalities of gait and mobility (R26.89);Other symptoms and signs involving the nervous system (R29.898)                Time: 7903-8333 OT Time Calculation (min): 22 min Charges:  OT General Charges $OT Visit: 1 Visit OT Evaluation $OT Eval Low Complexity: [place], OT Acute Rehabilitation Services Office: [phone]   Sharon Lawrence 04/24/2021, 1:38 PM

## 2021-04-24 NOTE — Evaluation (Signed)
Physical Therapy Evaluation Patient Details Name: Sharon Lawrence MRN: [id number] DOB: 1966/01/27 Today's Date: 04/24/2021  History of Present Illness  Pt presented to [facility] for nausea and vomiting on 2/19. Pt with septic shock and UTI. PMH - STEMI, ADHD, anxiety, chronic pain, CAD, Celiac disease,  Clinical Impression  Pt presents to PT with dependencies in mobility that have progressed over the past 6 months to where the pt has been nonambulatory at home since prior to Thanksgiving. She is able to transfer to bsc or w/c with assist of family. Fearful of falling and limited activity at home leading to more deconditioning. LE instability more pronounced proximally. If pt's family can continue to provide the level of assist they were providing prior to admission would recommend HHPT. If family unable to provide this assist may need to look into other post acute options.        Recommendations for follow up therapy are one component of a multi-disciplinary discharge planning process, led by the attending physician.  Recommendations may be updated based on patient status, additional functional criteria and insurance authorization.  Follow Up Recommendations Home health PT    Assistance Recommended at Discharge Frequent or constant Supervision/Assistance  Patient can return home with the following  A lot of help with walking and/or transfers    Equipment Recommendations None recommended by PT  Recommendations for Other Services       Functional Status Assessment Patient has had a recent decline in their functional status and demonstrates the ability to make significant improvements in function in a reasonable and predictable amount of time.     Precautions / Restrictions Precautions Precautions: Fall Restrictions Weight Bearing Restrictions: No      Mobility  Bed Mobility Overal bed mobility: Needs Assistance Bed Mobility: Supine to Sit     Supine to sit: Min assist, HOB elevated      General bed mobility comments: assist to bring legs off of bed, elevate trunk into sitting and bring hips to EOB    Transfers Overall transfer level: Needs assistance Equipment used: Rolling walker (2 wheels), Ambulation equipment used Transfers: Sit to/from Stand, Bed to chair/wheelchair/BSC Sit to Stand: +2 physical assistance, Mod assist           General transfer comment: Stood from bed with Stedy with assist to bring hips up. On first attempt unable to rise. On second attempt came to stand. From recliner stood with walker with assist to bring hips up. On first attempt unable to rise. On second attempt able to come to stand. Transfer via Lift Equipment: Stedy  Ambulation/Gait             Pre-gait activities: Stood x 2 with [employer] and x 1 with walker for 20 sec with +2 min/mod assist with hip/trunk instability. General Gait Details: Unable  Stairs            Wheelchair Mobility    Modified Rankin (Stroke Patients Only)       Balance Overall balance assessment: Needs assistance Sitting-balance support: No upper extremity supported, Feet supported Sitting balance-Leahy Scale: Fair     Standing balance support: Bilateral upper extremity supported Standing balance-Leahy Scale: Poor Standing balance comment: walker or stedy and +2 min assist for static standing                             Pertinent Vitals/Pain Pain Assessment Pain Assessment: 0-10 Pain Score: 6  Pain Location:  back and neck Pain Descriptors / Indicators: Aching, Sore Pain Intervention(s): Limited activity within patient's tolerance    Home Living Family/patient expects to be discharged to:: Private residence Living Arrangements: Other relatives (daughter and sister) Available Help at Discharge: Family;Available 24 hours/day Type of Home: House Home Access: Level entry       Home Layout: One level Home Equipment: Conservation officer, nature (2 wheels);Cane - single point;Shower  seat;BSC/3in1;Wheelchair - manual      Prior Function Prior Level of Function : Needs assist       Physical Assist : Mobility (physical);ADLs (physical)     Mobility Comments: Bed to bsc or w/c with assist of 1 family member ADLs Comments: Assist with ADLS     Hand Dominance   Dominant Hand: Right    Extremity/Trunk Assessment   Upper Extremity Assessment Upper Extremity Assessment: Defer to OT evaluation    Lower Extremity Assessment Lower Extremity Assessment: RLE deficits/detail;LLE deficits/detail RLE Deficits / Details: Ankles 4+/5, knees 3+/5, hips 3/5 RLE Sensation:  (Pt reports numbness in hips/buttocks) LLE Deficits / Details: Ankles 4+/5, knees 3+/5, hips 3/5 LLE Sensation:  (Pt reports numbness in hips/buttocks)       Communication   Communication: No difficulties  Cognition Arousal/Alertness: Awake/alert Behavior During Therapy: WFL for tasks assessed/performed Overall Cognitive Status: Within Functional Limits for tasks assessed                                          General Comments General comments (skin integrity, edema, etc.): VSS on RA    Exercises     Assessment/Plan    PT Assessment Patient needs continued PT services  PT Problem List Decreased strength;Decreased balance;Decreased mobility;Impaired sensation       PT Treatment Interventions DME instruction;Gait training;Functional mobility training;Therapeutic activities;Therapeutic exercise;Balance training;Neuromuscular re-education;Patient/family education    PT Goals (Current goals can be found in the Care Plan section)  Acute Rehab PT Goals Patient Stated Goal: to be able to get around again PT Goal Formulation: With patient Time For Goal Achievement: 05/08/21 Potential to Achieve Goals: Good    Frequency Min 3X/week     Co-evaluation PT/OT/SLP Co-Evaluation/Treatment: Yes Reason for Co-Treatment: For patient/therapist safety PT goals addressed during  session: Mobility/safety with mobility;Balance         AM-PAC PT "6 Clicks" Mobility  Outcome Measure Help needed turning from your back to your side while in a flat bed without using bedrails?: A Little Help needed moving from lying on your back to sitting on the side of a flat bed without using bedrails?: A Little Help needed moving to and from a bed to a chair (including a wheelchair)?: Total Help needed standing up from a chair using your arms (e.g., wheelchair or bedside chair)?: Total Help needed to walk in hospital room?: Total Help needed climbing 3-5 steps with a railing? : Total 6 Click Score: 10    End of Session Equipment Utilized During Treatment: Gait belt Activity Tolerance: Patient tolerated treatment well Patient left: in chair;with call bell/phone within reach Nurse Communication: Mobility status;Need for lift equipment PT Visit Diagnosis: Unsteadiness on feet (R26.81);Other abnormalities of gait and mobility (R26.89);History of falling (Z91.81)    Time: 7353-2992 PT Time Calculation (min) (ACUTE ONLY): 33 min   Charges:   PT Evaluation $PT Eval Moderate Complexity: 1 Mod          Julann Mcgilvray PT  Acute Rehabilitation Services Pager [phone] Office [place] 04/24/2021, 10:19 AM

## 2021-04-24 NOTE — Consult Note (Addendum)
Consultation  Referring Provider:  CCM/ Chand Primary Care Physician:  Lindell Spar, MD Primary Gastroenterologist:  Dr.Rehman  Reason for Consultation:  Cirrhosis, drop in hgb  HPI: Sharon Lawrence is a 56 y.o. female, known to Dr. Laural Golden, with history of celiac disease, chronic pain, probable NASH, and coronary artery disease status post ST EMI in 2019.  She was admitted 2 days ago from home after 2-week history of nausea vomiting, complaints of abdominal pain, numbness in her legs and profound weakness. She was hypotensive on admission with blood pressure 68/55, met SIRS sepsis criteria and was started on Levaquin and Flagyl.  Initial labs show WBC of 15.5, hemoglobin 6.3, platelets 137 and lactate 3.6. Chest x-ray negative except trace left effusion UA was positive, and urine culture positive for yeast. She has been requiring low-dose pressors, currently on Levophed and midodrine. She was transfused 2 units of packed RBCs, and hemoglobin yesterday up to 9/hematocrit 25.9, today hemoglobin has drifted back to 7.3/hematocrit of 20.6  Has not had any evidence of overt bleeding and Hemoccults have not been done. She does have history of chronic anemia with baseline hemoglobin in the 8 range as of October 2022.  Hemoglobin early February was 9.2. Anemia in the past felt secondary to chronic disease.  Upper abdominal ultrasound yesterday showed patient to be status postcholecystectomy with cirrhotic appearing liver and mild to moderate ascites. She has undergone paracentesis,, diagnostic and cell counts have been reviewed, not consistent with SBP   Most recent imaging with CT in October 2022 done with contrast showed hepatomegaly with hepatic steatosis and several hyperdensities were noted in the liver and recommended MRI which has not been done to date.  Patient had fairly recent EGD May 2022 per Dr. Melony Overly with finding of moderate chronic gastritis, no H. pylori, there is chronic  duodenitis normal-appearing esophagus, no varices.  Biopsies from the duodenum showed villous atrophy consistent with celiac disease  INR here has been elevated at 1.8/pro time 21 and platelets low in the 130 range  Platelets normal on 04/05/2021 and INR has outpatient was also normal  LFTs on 04/10/2021 T. bili 6.0/alk phos 166/AST 55/ALT 12  On admit T. bili 7.0/AST 44/ALT 13 and alk phos 109  Patient says she has been ill over the past year and is probably lost a total of 60 pounds over the past year.  She says she has had repeated episodes of nausea vomiting and upper abdominal pain sometimes associated with diarrhea and these episodes will last for 1 to 2 weeks at which point she becomes totally depleted, will get better for a little while and then will have a recurrent episode. She says she has been following a gluten-free diet.  She is frustrated by the recurrent nature of her symptoms. She actually had an appointment with Dr. Melony Overly today which she has canceled.  It was felt that she probably had gastroparesis though she has not had gastric emptying scan.  She had been tried on a trial of metoclopramide at home which caused her to have shaking or tremor and this was discontinued.  She has used Reglan occasionally during acute episodes.  Acute hepatitis panel - fall 2022, I do not see that she has had complete autoimmune work-up for liver disease.   Past Medical History:  Diagnosis Date   Acute ST elevation myocardial infarction (STEMI) of inferior wall ([place]) 09/15/2017   ADHD (attention deficit hyperactivity disorder)    Anxiety    B12  deficiency 02/07/2011   Back pain    CAD (coronary artery disease)    a. s/p recent STEMI on 09/15/2017 with DES to RCA   Celiac disease    Folic acid deficiency 01/0/2725   GI bleed 11/06/2017   Headache    History of ST elevation myocardial infarction (STEMI) 11/06/2017   Iron deficiency anemia 02/07/2011   Myocardial infarction ([place]) 09/15/2017   Syncope  and collapse 11/06/2017    Past Surgical History:  Procedure Laterality Date   BIOPSY  07/12/2020   Procedure: BIOPSY;  Surgeon: Rogene Houston, MD;  Location: [facility];  Service: Endoscopy;;  duodenum(2nd part);antral   CHOLECYSTECTOMY     COLONOSCOPY WITH PROPOFOL N/A 09/26/2017   rehman examined portion of ileum normal, - Five small, non-bleeding polyps in the rectum, in the sigmoid colon and at the hepatic   CORONARY STENT INTERVENTION Right 09/15/2017   Procedure: CORONARY STENT INTERVENTION;  Surgeon: Jettie Booze, MD;  Location: [facility];  Service: Cardiovascular;  Laterality: Right;  RCA   ESOPHAGOGASTRODUODENOSCOPY (EGD) WITH PROPOFOL N/A 07/12/2020   rehman: normal hypopharynx, Normal esophagus. z line regular 35 cm from incisors, congested and erythematous mucosa in antrum, mucosal changes in duodenum, very pronounced changes to suggest active celiac disease   KNEE CARTILAGE SURGERY Right    LEFT HEART CATH AND CORONARY ANGIOGRAPHY N/A 09/15/2017   Procedure: LEFT HEART CATH AND CORONARY ANGIOGRAPHY;  Surgeon: Jettie Booze, MD;  Location: [facility];  Service: Cardiovascular;  Laterality: N/A;   TONSILLECTOMY      Prior to Admission medications   Medication Sig Start Date End Date Taking? Authorizing Provider  aspirin 81 MG chewable tablet Chew 1 tablet (81 mg total) by mouth daily. 09/18/17  Yes Cheryln Manly, NP  famotidine (PEPCID) 40 MG tablet Take 1 tablet (40 mg total) by mouth at bedtime. 10/26/20  Yes Rehman, Mechele Dawley, MD  ketoconazole (NIZORAL) 2 % cream Apply topically 2 (two) times daily. Patient taking differently: Apply topically 2 (two) times daily as needed for irritation. 12/16/20  Yes Johnson, Eldridge Dace, MD  levonorgestrel (MIRENA) 20 MCG/24HR IUD by Intrauterine route.   Yes [provider]  loperamide (IMODIUM) 2 MG capsule Take 1 capsule (2 mg total) by mouth 3 (three) times daily as needed for diarrhea or  loose stools. 07/14/20  Yes Kathie Dike, MD  metoCLOPramide (REGLAN) 10 MG tablet Take 1 tablet (10 mg total) by mouth every 8 (eight) hours as needed for nausea. 12/16/20  Yes Johnson, Clanford L, MD  metoprolol succinate (TOPROL XL) 25 MG 24 hr tablet Take 1 tablet (25 mg total) by mouth daily. 12/27/20  Yes Strader, [place] M, PA-C  nitroGLYCERIN (NITROLINGUAL) 0.4 MG/SPRAY spray Place 2 sprays under the tongue every 5 (five) minutes x 3 doses as needed. 04/03/20  Yes [provider]  nystatin (MYCOSTATIN/NYSTOP) powder Apply topically 3 (three) times daily as needed (yeast infection). 12/16/20  Yes Johnson, Clanford L, MD  omeprazole (PRILOSEC) 40 MG capsule Take 1 capsule (40 mg total) by mouth daily. 11/28/20  Yes Carlan, Chelsea L, NP  ondansetron (ZOFRAN-ODT) 8 MG disintegrating tablet Take 1 tablet (8 mg total) by mouth 3 (three) times daily as needed for nausea. 07/14/20  Yes Kathie Dike, MD  potassium chloride (KLOR-CON) 10 MEQ tablet Take 10 meq daily when you take Torsemide 12/28/20  Yes Strader, [place], PA-C  pregabalin (LYRICA) 75 MG capsule Take 1 capsule (75 mg total) by  mouth 2 (two) times daily. 04/16/21  Yes Lindell Spar, MD  prochlorperazine (COMPAZINE) 10 MG tablet TAKE (1) TABLET BY MOUTH EVERY SIX HOURS AS NEEDED NAUSEA OR VOMITING. Patient taking differently: Take 10 mg by mouth every 6 (six) hours as needed for nausea or vomiting. 10/10/20  Yes Rehman, Mechele Dawley, MD  rizatriptan (MAXALT) 10 MG tablet Take 10 mg by mouth daily as needed for migraine. 12/31/19  Yes [provider]  tiZANidine (ZANAFLEX) 4 MG tablet Take 1 tablet (4 mg total) by mouth every 8 (eight) hours as needed for muscle spasms. 01/11/21  Yes Lindell Spar, MD  torsemide (DEMADEX) 20 MG tablet Take 1 tablet (20 mg total) by mouth daily as needed (edema). As needed. 12/16/20  Yes Johnson, Clanford L, MD  Vitamin D, Ergocalciferol, (DRISDOL) 1.25 MG (50000 UT) CAPS capsule Take  50,000 Units by mouth every 7 (seven) days. Wednesday Patient not taking: Reported on 04/23/2021 09/14/18   [provider]    Current Facility-Administered Medications  Medication Dose Route Frequency Provider Last Rate Last Admin   0.9 %  sodium chloride infusion   Intravenous PRN Margaretha Seeds, MD   Paused at 04/23/21 [id number]   acetaminophen (TYLENOL) tablet 650 mg  650 mg Oral Q6H PRN Jacky Kindle, MD       cefTRIAXone (ROCEPHIN) 1 g in sodium chloride 0.9 % 100 mL IVPB  1 g Intravenous Q24H Jacky Kindle, MD 200 mL/hr at 04/24/21 1119 1 g at 04/24/21 1119   Chlorhexidine Gluconate Cloth 2 % PADS 6 each  6 each Topical Daily Collene Gobble, MD   6 each at 04/23/21 2017   lactulose (CHRONULAC) 10 GM/15ML solution 20 g  20 g Oral BID Jose Persia, MD   20 g at 04/24/21 1118   MEDLINE mouth rinse  15 mL Mouth Rinse BID Jacky Kindle, MD   15 mL at 04/24/21 0832   midodrine (PROAMATINE) tablet 15 mg  15 mg Oral TID WC Jose Persia, MD   15 mg at 04/24/21 1018   multivitamin with minerals tablet 1 tablet  1 tablet Oral Daily Jacky Kindle, MD       norepinephrine (LEVOPHED) 35m in 2519m(0.016 mg/mL) premix infusion  0-40 mcg/min Intravenous Titrated ChJacky KindleMD 3.75 mL/hr at 04/24/21 0500 1 mcg/min at 04/24/21 0500   ondansetron (ZOFRAN) injection 4 mg  4 mg Intravenous Q8H PRN AvJudd LienMD   4 mg at 04/24/21 1107   oxyCODONE (Oxy IR/ROXICODONE) immediate release tablet 5 mg  5 mg Oral Q6H PRN ChJacky KindleMD   5 mg at 04/24/21 1107   pantoprazole (PROTONIX) EC tablet 40 mg  40 mg Oral QHS ChJacky KindleMD   40 mg at 04/23/21 2106   sodium chloride (OCEAN) 0.65 % nasal spray 1 spray  1 spray Each Nare PRN ChJacky KindleMD       sodium chloride flush (NS) 0.9 % injection 10-40 mL  10-40 mL Intracatheter Q12H Chand, SuCurrie ParisMD   10 mL at 04/24/21 086160 sodium chloride flush (NS) 0.9 % injection 10-40 mL  10-40 mL Intracatheter PRN ChJacky KindleMD         Allergies as of 04/22/2021 - Review Complete 04/22/2021  Allergen Reaction Noted   Bee venom Anaphylaxis    Erythromycin Anaphylaxis 06/03/2012   Cephalexin  11/17/2017   Neomycin-bacitracin-polymyxin  [bacitracin-neomycin-polymyxin] Swelling 09/09/2018   Neosporin original [bacitracin-neomycin-polymyxin] Swelling 06/03/2012   Prednisone  11/17/2017  Sulfa antibiotics Swelling 06/03/2012   Tobrex [tobramycin] Swelling 09/15/2016   Tramadol Hives 06/22/2013   Penicillins Rash 06/03/2012    Family History  Problem Relation Age of Onset   Cancer Mother    Hypertension Mother    Hypertension Father    Hypothyroidism Father    Cancer Sister    Anxiety disorder Sister    Depression Sister    CAD Maternal Grandfather    Cirrhosis Paternal Grandfather    CAD Paternal Grandmother    Diabetes Paternal Grandmother    Hypothyroidism Paternal Grandmother    Drug abuse Son    Bipolar disorder Son     Social History   Socioeconomic History   Marital status: Widowed    Spouse name: Not on file   Number of children: 3   Years of education: Not on file   Highest education level: Associate degree: occupational, Hotel manager, or vocational program  Occupational History   Not on file  Tobacco Use   Smoking status: Former    Packs/day: 0.25    Years: 12.00    Pack years: 3.00    Types: Cigarettes    Quit date: 04/04/2017    Years since quitting: 4.0   Smokeless tobacco: Never  Vaping Use   Vaping Use: Never used  Substance and Sexual Activity   Alcohol use: No   Drug use: No   Sexual activity: Not Currently  Other Topics Concern   Not on file  Social History Narrative   Not on file   Social Determinants of Health   Financial Resource Strain: Not on file  Food Insecurity: Not on file  Transportation Needs: Not on file  Physical Activity: Not on file  Stress: Not on file  Social Connections: Not on file  Intimate Partner Violence: Not on file    Review of  Systems: Pertinent positive and negative review of systems were noted in the above HPI section.  All other review of systems was otherwise negative.   Physical Exam: Vital signs in last 24 hours: Temp:  [97.9 F (36.6 C)-98.8 F (37.1 C)] 97.9 F (36.6 C) (02/21 1132) Pulse Rate:  [80-112] 98 (02/21 0900) Resp:  [9-20] 18 (02/21 0900) BP: (81-133)/(46-88) 133/67 (02/21 0900) SpO2:  [95 %-100 %] 99 % (02/21 0900) Last BM Date :  (pta) General:   Alert,  Well-developed, cute and chronically ill-appearing white female pleasant and cooperative in NAD Sitting up in a chair, tearful Head:  Normocephalic and atraumatic. Eyes:  Sclera icteric   conjunctiva pink. Ears:  Normal auditory acuity. Nose:  No deformity, discharge,  or lesions. Mouth:  No deformity or lesions.   Neck:  Supple; no masses or thyromegaly. Lungs:  Clear throughout to auscultation.   No wheezes, crackles, or rhonchi.  Heart:  Regular rate and rhythm; no murmurs, clicks, rubs,  or gallops. Abdomen:  Soft,nontender, BS active,nonpalp mass or hsm.  No appreciable fluid wave currently Rectal: Not done Msk:  Symmetrical without gross deformities. . Pulses:  Normal pulses noted. Extremities:  Without clubbing or edema, trace edema bilateral lower extremities Neurologic:  Alert and  oriented x4;  grossly normal neurologically. Skin:  Intact without significant lesions or rashes.. Psych:  Alert and cooperative. Normal mood and affect.  Intake/Output from previous day: 02/20 0701 - 02/21 0700 In: 2056.5 [P.O.:200; I.V.:354; Blood:320; IV Piggyback:1182.5] Out: 5638 [Urine:215] Intake/Output this shift: No intake/output data recorded.  Lab Results: Recent Labs    04/22/21 1509 04/23/21 0220 04/24/21 0433  WBC  15.5* 10.2 10.4  HGB 6.3* 9.0* 7.3*  HCT 17.9* 25.9* 20.6*  PLT 137* 100* 106*   BMET Recent Labs    04/23/21 0220 04/23/21 1250 04/24/21 0433  NA 133* 133* 131*  K 2.9* 2.8* 3.5  CL 99 98 98  CO2  19* 21* 23  GLUCOSE 82 76 94  BUN _0 CREATININE 0.80 0.90 0.85  CALCIUM 7.1* 7.2* 7.9*   LFT Recent Labs    04/24/21 0433  PROT 4.8*  ALBUMIN 2.5*  AST 32  ALT 9  ALKPHOS 85  BILITOT 7.2*   PT/INR Recent Labs    04/23/21 0220 04/24/21 0433  LABPROT 22.3* 22.0*  INR 2.0* 1.9*   Hepatitis Panel No results for input(s): HEPBSAG, HCVAB, HEPAIGM, HEPBIGM in the last 72 hours.   IMPRESSION:  #45  56 year old white female admitted 2 days ago after 2-week history of nausea vomiting, abdominal pain with profound weakness , Hypotensive on admission and has been requiring pressors, elevated lactate and elevated WBC consistent with sepsis felt likely secondary to UTI However culture growing only yeast Peritoneal fluid analysis not consistent with SBP  #2 history of probable [name], now with evidence of cirrhosis and ascites by imaging-definitely seems to have progressed to cirrhosis though not clear that it is decompensated No evidence of esophageal varices on recent EGD 12/2020 She has had thrombocytopenia and elevated INR this admission which are likely secondary to sepsis as were normal earlier this month  Also need to rule out other underlying causes for cirrhosis i.e. autoimmune/inheritable  #3 history of celiac disease-variable compliance with diet in the past #4 recurrent episodes of nausea vomiting and abdominal pain occurring over the past year associated with significant weight loss-etiology not clear, her primary gastroenterologist had thought possibly underlying gastroparesis and had been given trial of prokinetic but did not tolerate No evidence for mild or large bowel thickening or neoplasm by CT October 2022 Query if some of the symptoms may be due to active celiac disease  #5 hypodense liver lesions on CT October 2020 to rule out HCC-needs MRI #6 anemia chronic-picture previously consistent with anemia of chronic disease though has had drop in hemoglobin with  this admission No evidence for overt bleeding  #7 coronary artery disease status post MI 2019 8 sacral decubitus 9 hypoalbuminemia-multifactoral #10 history of adenomatous colon polyps-last colonoscopy 2019--due for follow-up     PLAN: Continue empiric PPI Trend hemoglobin and transfuse as indicated Check Hemoccult AFP ordered and pending Patient will need MRI of the abdomen this admission but would wait until she is off pressors etc. as not emergent We will check autoimmune and inheritable hepatic markers Trend pro time  Strict gluten-free diet GI will follow with you, patient plans to return to the care of Dr. Kyung Bacca on discharge   Rumi Trellis Paganini PA-C 04/24/2021, 12:14 PM     Attending physician's note   I have taken history, reviewed the chart and examined the patient. I performed a substantive portion of this encounter, including complete performance of at least one of the key components, in conjunction with the APP. I agree with the Advanced Practitioner's note, impression and recommendations.   56yrold adm with N/V/abdo pain, found to have urosepsis with shock req pressors (off now).  Also with decompensated NASH cirrhosis with mild ascites. Neg SBP. SAAG c/w portal HTN. CT [facility] does show hypodense lesions in the liver. ?Etiology.  Also with thrombocytopenia and coagulopathy.  No overt GI bleeding.  She does have anemia of chronic disease with drop in hemoglobin from 8 to 6.3 s/p 2U to 7.3.  Recent EGD 12/2020 without EV  H/O celiac disease on gluten-free diet.  H/O colon polyps 2019  Plan: -Low-salt, Nl protein gluten-free diet. -Check AFP -MRI liver (preferably with contrast-Eovist if possible) near discharge. -Check for any other etiology of liver disease. -She is closely followed by Dr. Laural Golden (GI).  Suggest outpt FU with him. -She will likely require outpt EGD/colonoscopy.   Carmell Austria, MD [facility] [phone]

## 2021-04-25 ENCOUNTER — Inpatient Hospital Stay (HOSPITAL_COMMUNITY): Payer: Self-pay

## 2021-04-25 LAB — MITOCHONDRIAL ANTIBODIES: Mitochondrial M2 Ab, IgG: <20 Units (ref 0.0–20.0)

## 2021-04-25 LAB — COMPREHENSIVE METABOLIC PANEL
ALT: 14 U/L (ref 0–44)
AST: 45 U/L — ABNORMAL HIGH (ref 15–41)
Albumin: 2.4 g/dL — ABNORMAL LOW (ref 3.5–5.0)
Alkaline Phosphatase: 97 U/L (ref 38–126)
Anion gap: 11 (ref 5–15)
BUN: 11 mg/dL (ref 6–20)
CO2: 23 mmol/L (ref 22–32)
Calcium: 7.9 mg/dL — ABNORMAL LOW (ref 8.9–10.3)
Chloride: 99 mmol/L (ref 98–111)
Creatinine, Ser: 0.76 mg/dL (ref 0.44–1.00)
GFR, Estimated: >60 mL/min (ref >60)
Glucose, Bld: 84 mg/dL (ref 70–99)
Potassium: 3.1 mmol/L — ABNORMAL LOW (ref 3.5–5.1)
Sodium: 133 mmol/L — ABNORMAL LOW (ref 135–145)
Total Bilirubin: 6.3 mg/dL — ABNORMAL HIGH (ref 0.3–1.2)
Total Protein: 4.8 g/dL — ABNORMAL LOW (ref 6.5–8.1)

## 2021-04-25 LAB — CBC WITH DIFFERENTIAL/PLATELET
Abs Immature Granulocytes: 0.05 10*3/uL (ref 0.00–0.07)
Basophils Absolute: 0 10*3/uL (ref 0.0–0.1)
Basophils Relative: 0 %
Eosinophils Absolute: 0.2 10*3/uL (ref 0.0–0.5)
Eosinophils Relative: 2 %
HCT: 21.7 % — ABNORMAL LOW (ref 36.0–46.0)
Hemoglobin: 8 g/dL — ABNORMAL LOW (ref 12.0–15.0)
Immature Granulocytes: 1 %
Lymphocytes Relative: 27 %
Lymphs Abs: 2.8 10*3/uL (ref 0.7–4.0)
MCH: 29.4 pg (ref 26.0–34.0)
MCHC: 36.9 g/dL — ABNORMAL HIGH (ref 30.0–36.0)
MCV: 79.8 fL — ABNORMAL LOW (ref 80.0–100.0)
Monocytes Absolute: 0.6 10*3/uL (ref 0.1–1.0)
Monocytes Relative: 5 %
Neutro Abs: 6.8 10*3/uL (ref 1.7–7.7)
Neutrophils Relative %: 65 %
Platelets: 95 10*3/uL — ABNORMAL LOW (ref 150–400)
RBC: 2.72 MIL/uL — ABNORMAL LOW (ref 3.87–5.11)
RDW: 16.7 % — ABNORMAL HIGH (ref 11.5–15.5)
WBC: 10.5 10*3/uL (ref 4.0–10.5)
nRBC: 0 % (ref 0.0–0.2)

## 2021-04-25 LAB — ALPHA-1-ANTITRYPSIN: A-1 Antitrypsin, Ser: 149 mg/dL (ref 101–187)

## 2021-04-25 LAB — CERULOPLASMIN: Ceruloplasmin: 10.8 mg/dL — ABNORMAL LOW (ref 19.0–39.0)

## 2021-04-25 LAB — PROCALCITONIN: Procalcitonin: 0.46 ng/mL

## 2021-04-25 LAB — ANTI-SMOOTH MUSCLE ANTIBODY, IGG: F-Actin IgG: 11 Units (ref 0–19)

## 2021-04-25 LAB — AFP TUMOR MARKER: AFP, Serum, Tumor Marker: 4.2 ng/mL (ref 0.0–9.2)

## 2021-04-25 LAB — ANA W/REFLEX IF POSITIVE: Anti Nuclear Antibody (ANA): NEGATIVE

## 2021-04-25 MED ORDER — FLUCONAZOLE 200 MG PO TABS
200.0000 mg | ORAL_TABLET | Freq: Once | ORAL | Status: AC
  Administered 2021-04-25: 200 mg via ORAL
  Filled 2021-04-25: qty 1

## 2021-04-25 MED ORDER — LORAZEPAM 2 MG/ML IJ SOLN
1.0000 mg | Freq: Once | INTRAMUSCULAR | Status: AC
  Administered 2021-04-25: 2 mg via INTRAVENOUS
  Filled 2021-04-25: qty 1

## 2021-04-25 MED ORDER — GADOBUTROL 1 MMOL/ML IV SOLN
8.5000 mL | Freq: Once | INTRAVENOUS | Status: AC | PRN
  Administered 2021-04-25: 8.5 mL via INTRAVENOUS

## 2021-04-25 MED ORDER — POTASSIUM CHLORIDE CRYS ER 20 MEQ PO TBCR
40.0000 meq | EXTENDED_RELEASE_TABLET | Freq: Two times a day (BID) | ORAL | Status: AC
Start: 2021-04-25 — End: 2021-04-25
  Administered 2021-04-25 (×2): 40 meq via ORAL
  Filled 2021-04-25 (×2): qty 2

## 2021-04-25 MED ORDER — "THROMBI-PAD 3""X3"" EX PADS"
1.0000 | MEDICATED_PAD | Freq: Once | CUTANEOUS | Status: AC
  Administered 2021-04-25: 1 via TOPICAL
  Filled 2021-04-25: qty 1

## 2021-04-25 NOTE — Progress Notes (Addendum)
Patient ID: Sharon Lawrence, female   DOB: 14-Dec-1965, 56 y.o.   MRN: [id number]    Progress Note   Subjective   Day # 3  CC; Cirrhosis, anemia, celiac disease  Patient resting comfortably, no plaints of abdominal pain today, no nausea as long as Zofran on board, no diarrhea patient says she has been very strict with gluten-free diet since hospitalization in October.  AFP 4.2 Autoimmune markers pending, inheritable markers pending  WBC 10.5/hemoglobin 8/hematocrit 21.7-stable improved Platelets 95 Potassium 3.1 T. bili 6.3/alk phos 97/AST 45/ALT 14   Objective   Vital signs in last 24 hours: Temp:  [97.9 F (36.6 C)-98.5 F (36.9 C)] 98.4 F (36.9 C) (02/22 0746) Pulse Rate:  [86-100] 100 (02/22 0800) Resp:  [10-17] 16 (02/22 0800) BP: (89-112)/(52-75) 100/67 (02/22 0800) SpO2:  [96 %-100 %] 99 % (02/22 0800) Last BM Date :  (2 weeks ago) General:    WD WF ill appearing in NAD Heart:  Regular rate and rhythm; no murmurs Lungs: Respirations even and unlabored, lungs CTA bilaterally Abdomen:  Soft, nontender and nondistended. Normal bowel sounds. Extremities:  Without edema. Neurologic:  Alert and oriented,  grossly normal neurologically. Psych:  Cooperative. Normal mood and affect.  Intake/Output from previous day: 02/21 0701 - 02/22 0700 In: 740 [P.O.:520; I.V.:150] Out: 275 [Urine:275] Intake/Output this shift: No intake/output data recorded.  Lab Results: Recent Labs    04/23/21 0220 04/24/21 0433 04/25/21 0550  WBC 10.2 10.4 10.5  HGB 9.0* 7.3* 8.0*  HCT 25.9* 20.6* 21.7*  PLT 100* 106* 95*   BMET Recent Labs    04/23/21 1250 04/24/21 0433 04/25/21 0550  NA 133* 131* 133*  K 2.8* 3.5 3.1*  CL 98 98 99  CO2 21* 23 23  GLUCOSE 76 94 84  BUN 14 12 11   CREATININE 0.90 0.85 0.76  CALCIUM 7.2* 7.9* 7.9*   LFT Recent Labs    04/25/21 0550  PROT 4.8*  ALBUMIN 2.4*  AST 45*  ALT 14  ALKPHOS 97  BILITOT 6.3*   PT/INR Recent Labs    04/24/21 0433  04/24/21 1633  LABPROT 22.0* 21.8*  INR 1.9* 1.9*    Studies/Results: DG CHEST PORT 1 VIEW  Result Date: 04/23/2021 CLINICAL DATA:  Central line placement EXAM: PORTABLE CHEST 1 VIEW COMPARISON:  April 22, 2021 FINDINGS: The heart size and mediastinal contours are within normal limits. Left central venous line is identified with distal tip in the superior vena cava. There is no pneumothorax. Mild atelectasis of left lung base is noted. The visualized skeletal structures are unremarkable. IMPRESSION: Left central venous line distal tip in the superior vena cava. No pneumothorax. Electronically Signed   By: Abelardo Diesel M.D.   On: 04/23/2021 12:19   ECHOCARDIOGRAM COMPLETE  Result Date: 04/23/2021    ECHOCARDIOGRAM REPORT   Patient Name:   Yoko K Gatti Date of Exam: 04/23/2021 Medical Rec #:  [id number]  Height:       60.0 in Accession #:    [id number] Weight:       181.4 lb Date of Birth:  17-Nov-1965  BSA:          1.791 m Patient Age:    4 years   BP:           92/60 mmHg Patient Gender: F          HR:           93 bpm. Exam Location:  Inpatient Procedure: 2D Echo  Indications:    abnormal ecg  History:        Patient has prior history of Echocardiogram examinations, most                 recent 01/07/2020.  Sonographer:    Johny Chess RDCS Referring Phys: [id number] [place]  1. Left ventricular ejection fraction, by estimation, is 60 to 65%. The left ventricle has normal function. The left ventricle has no regional wall motion abnormalities. Left ventricular diastolic parameters were normal.  2. Right ventricular systolic function is normal. The right ventricular size is normal.  3. The mitral valve is normal in structure. Trivial mitral valve regurgitation.  4. The aortic valve is normal in structure. Aortic valve regurgitation is not visualized. FINDINGS  Left Ventricle: Left ventricular ejection fraction, by estimation, is 60 to 65%. The left ventricle has normal function. The  left ventricle has no regional wall motion abnormalities. The left ventricular internal cavity size was normal in size. There is  no left ventricular hypertrophy. Left ventricular diastolic parameters were normal. Right Ventricle: The right ventricular size is normal. Right vetricular wall thickness was not well visualized. Right ventricular systolic function is normal. Left Atrium: Left atrial size was normal in size. Right Atrium: Right atrial size was normal in size. Pericardium: There is no evidence of pericardial effusion. Mitral Valve: The mitral valve is normal in structure. Trivial mitral valve regurgitation. Tricuspid Valve: The tricuspid valve is grossly normal. Tricuspid valve regurgitation is not demonstrated. Aortic Valve: The aortic valve is normal in structure. Aortic valve regurgitation is not visualized. Pulmonic Valve: The pulmonic valve was normal in structure. Pulmonic valve regurgitation is not visualized. Aorta: The aortic root and ascending aorta are structurally normal, with no evidence of dilitation. IAS/Shunts: The atrial septum is grossly normal.  LEFT VENTRICLE PLAX 2D LVIDd:         4.70 cm   Diastology LVIDs:         2.40 cm   LV e' medial:    11.20 cm/s LV PW:         0.90 cm   LV E/e' medial:  10.4 LV IVS:        0.70 cm   LV e' lateral:   13.90 cm/s LVOT diam:     1.90 cm   LV E/e' lateral: 8.4 LV SV:         77 LV SV Index:   43 LVOT Area:     2.84 cm  RIGHT VENTRICLE             IVC RV S prime:     17.80 cm/s  IVC diam: 1.70 cm TAPSE (M-mode): 4.2 cm LEFT ATRIUM             Index        RIGHT ATRIUM           Index LA diam:        3.70 cm 2.07 cm/m   RA Area:     12.10 cm LA Vol (A2C):   36.9 ml 20.60 ml/m  RA Volume:   27.50 ml  15.36 ml/m LA Vol (A4C):   36.3 ml 20.27 ml/m LA Biplane Vol: 38.1 ml 21.27 ml/m  AORTIC VALVE LVOT Vmax:   155.00 cm/s LVOT Vmean:  105.000 cm/s LVOT VTI:    0.272 m  AORTA Ao Root diam: 2.80 cm Ao Asc diam:  3.00 cm MITRAL VALVE MV Area (PHT):  4.36 cm  SHUNTS MV Decel Time: 174 msec     Systemic VTI:  0.27 m MV E velocity: 117.00 cm/s  Systemic Diam: 1.90 cm MV A velocity: 75.30 cm/s MV E/A ratio:  1.55 Mertie Moores MD Electronically signed by Mertie Moores MD Signature Date/Time: 04/23/2021/3:37:57 PM    Final        Assessment / Plan:    #86  56 year old white female with history of celiac disease, and previously presumed NASH admitted 3 days ago with generalized weakness, nausea vomiting and abdominal pain.  Met sepsis criteria and was hypovolemic  ,She is off IV pressors now, on midodrine orally Sepsis secondary to UTI  Patient has had multiple episodes over the past year of nausea vomiting diarrhea and abdominal pain, some of these episodes lasting for a couple of weeks like this most recent 1.  There has been concern that these episodes are secondary to active celiac disease.  TTG was checked in October and was significantly elevated consistent with gluten exposure, patient says she has been strictly gluten-free since  Consider refractory sprue-Dr. Melony Overly had been considering oral budesonide  #2 evidence for progression to cirrhosis by imaging, also had ascites No SBP No evidence of esophageal varices on EGD October 2022 Thrombocytopenia and elevated INR this admission-parameters likely being affected by sepsis as were normal even a month ago  Cirrhosis may be secondary to NASH but will complete work-up to rule out autoimmune and/or inheritable liver disease Previous viral serologies negative  #3 hyperdense liver lesions on CT October 2022-needs MRI #4 anemia chronic parameters consistent with anemia of chronic disease-drop in hemoglobin this admission, hemoglobin has been stable since transfusion no evidence for active bleeding  #5 coronary artery disease status post MI 2019 #6 sacral decubitus #7 hypoalbuminemia multifactoral #8 history of adenomatous colon polyps last colonoscopy 2019, due for follow-up  Plan;  continue empiric PPI Needs to be on a very strict gluten-free diet AFP normal which is reassuring-MRI has been scheduled for today per primary team Follow-up pending labs GI will continue to follow.        Principal Problem:   Acute sepsis ([place]) Active Problems:   Hypokalemia   Thrombocytopenia (HCC)   Pressure injury of skin     LOS: 3 days   Kemesha Esterwood PA-C 04/25/2021, 9:43 AM    Attending physician's note   I have taken history, reviewed the chart and examined the patient. I performed a substantive portion of this encounter, including complete performance of at least one of the key components, in conjunction with the APP. I agree with the Advanced Practitioner's note, impression and recommendations.   No GI complaints.  No N/V/diarrhea.  Urosepsis (resolved) on Rocephin Celiac disease NASH cirrhosis with mild ascites. Neg SBP Liver lesions- Nl AFP, MRI liver has been ordered  Continue supportive treatment. Strict gluten-free diet. Follow pending liver work-up. We will require EGD/colonoscopy as outpt (Dr Laural Golden) Will sign off. Pl call if any ? Or change in status.   Carmell Austria, MD [facility] [phone]

## 2021-04-25 NOTE — Progress Notes (Signed)
PROGRESS NOTE    Sharon Lawrence  TKZ:[id number] DOB: [birth year] DOA: 04/22/2021 PCP: Lindell Spar, MD   Chief Complaint  Patient presents with   Weakness    Brief Narrative:  56 year old with prior history of coronary artery disease, STEMI in 2019, celiac disease, Sharon Lawrence, GI bleed, chronic pain syndrome, liver cirrhosis who presented with generalized weakness, nausea, vomiting and abdominal pain, admitted with undifferentiated shock septic/hypovolemic. Patient came off of vasopressors, SBP was ruled out with negative peritoneal fluid studies. Urine cultures show yeast.   Assessment & Plan:   Principal Problem:   Acute sepsis ([place]) Active Problems:   Hypokalemia   Thrombocytopenia (HCC)   Pressure injury of skin   Shock Differential includes sepsis versus hypovolemic shock. Urine cultures growing yeast. Peritoneal fluid not consistent with SBP. Patient was weaned off vasopressors. Continue ceftriaxone to complete the course. Blood cultures negative so far.     Suspected decompensated cirrhosis with ascites with a history of NAFLD So far work-up is negative for autoimmune hepatitis and viral hepatitis.  Progression of cirrhosis in the last few months. Evidence of portal hypertension is seen. Liver MRI ordered as per GI recommendations. AFP negative. Lactulose added and target about 2-3 bowel movements per day.    Elevated BNP and anasarca probably secondary to hypoalbuminemia secondary to cirrhosis. Echocardiogram shows hyperdynamic left ventricular ejection fraction.    Chronic bilateral lower extremity weakness Therapy evaluations ordered. Resume home medications. Outpatient neurology follow-up recommended.    Acute on chronic microcytic anemia and acute thrombocytopenia in the setting of liver cirrhosis and history of GI bleed in 2019 due to [place]  S/p 1 unit of PRBC transfusion. Transfuse to keep hemoglobin greater than 7. Monitor for signs of  bleeding. Acute thrombocytopenia probably secondary to decompensated cirrhosis    History of coronary artery disease, STEMI s/p PCI to RCA Patient currently denies any chest pain or shortness of breath. Restart home dose of aspirin and antihypertensives.  Pressures improved   History of celiac disease Continue with gluten-free diet    Unintentional weight loss Outpatient follow-up with PCP for appropriate cancer screening.     Pressure Injury  present on admission:   Pressure Injury 04/22/21 Sacrum Right;Mid Stage 2 -  Partial thickness loss of dermis presenting as a shallow open injury with a red, pink wound bed without slough. Pink wound bed (Active)  04/22/21 2145  Location: Sacrum  Location Orientation: Right;Mid  Staging: Stage 2 -  Partial thickness loss of dermis presenting as a shallow open injury with a red, pink wound bed without slough.  Wound Description (Comments): Pink wound bed  Present on Admission: Yes   Wound care will be consulted.    Hyponatremia, hypokalemia  Replace potassium . Repeat in the morning.     DVT prophylaxis:  Code Status: Full code.  Family Communication: none at bedside.  Disposition:   Status is: Inpatient Remains inpatient appropriate because: further work up            Consultants:  Gastroenterology.   Procedures: MRI liver.   Antimicrobials:  Antibiotics Given (last 72 hours)     Date/Time Action Medication Dose Rate   04/22/21 1552 New Bag/Given   levofloxacin (LEVAQUIN) IVPB 750 mg 750 mg 100 mL/hr   04/23/21 1323 New Bag/Given   cefTRIAXone (ROCEPHIN) 1 g in sodium chloride 0.9 % 100 mL IVPB 1 g 200 mL/hr   04/24/21 1119 New Bag/Given   cefTRIAXone (ROCEPHIN) 1 g in sodium chloride 0.9 %  100 mL IVPB 1 g 200 mL/hr   04/25/21 1159 New Bag/Given   cefTRIAXone (ROCEPHIN) 1 g in sodium chloride 0.9 % 100 mL IVPB 1 g 200 mL/hr        Subjective: Nauseated. No abd pain.  Does not want any lactulose.  But agreed to take it.  Objective: Vitals:   04/25/21 0600 04/25/21 0700 04/25/21 0746 04/25/21 0800  BP: 93/66 (!) 95/59  100/67  Pulse: 97 97  100  Resp: 15 12  16   Temp:   98.4 F (36.9 C)   TempSrc:   Oral   SpO2: 98%   99%  Weight:      Height:        Intake/Output Summary (Last 24 hours) at 04/25/2021 1031 Last data filed at 04/25/2021 0600 Gross per 24 hour  Intake 420 ml  Output 275 ml  Net 145 ml   Filed Weights   04/22/21 1410  Weight: 82.3 kg    Examination:  General exam: well developed lady not in distress.  Respiratory system: Clear to auscultation. Respiratory effort normal. Cardiovascular system: S1 & S2 heard, RRR. No JVD,   No pedal edema. Gastrointestinal system: Abdomen is nondistended, soft and nontender.  Normal bowel sounds heard. Central nervous system: Alert and oriented. No focal neurological deficits. Extremities: Symmetric 5 x 5 power. Skin: No rashes, lesions or ulcers Psychiatry:  Mood & affect appropriate.     Data Reviewed: I have personally reviewed following labs and imaging studies  CBC: Recent Labs  Lab 04/22/21 1509 04/23/21 0220 04/24/21 0433 04/25/21 0550  WBC 15.5* 10.2 10.4 10.5  NEUTROABS 12.3*  --  7.1 6.8  HGB 6.3* 9.0* 7.3* 8.0*  HCT 17.9* 25.9* 20.6* 21.7*  MCV 79.6* 80.7 79.8* 79.8*  PLT 137* 100* 106* 95*    Basic Metabolic Panel: Recent Labs  Lab 04/22/21 1509 04/23/21 0220 04/23/21 1250 04/24/21 0433 04/24/21 0823 04/25/21 0550  NA 133* 133* 133* 131*  --  133*  K 2.3* 2.9* 2.8* 3.5  --  3.1*  CL 95* 99 98 98  --  99  CO2 25 19* 21* 23  --  23  GLUCOSE 98 82 76 94  --  84  BUN 20 15 14 12   --  11  CREATININE 0.80 0.80 0.90 0.85  --  0.76  CALCIUM 7.5* 7.1* 7.2* 7.9*  --  7.9*  MG  --  1.9  --   --  2.4  --   PHOS  --  2.5  --   --   --   --     GFR: Estimated Creatinine Clearance: 77.3 mL/min (by C-G formula based on SCr of 0.76 mg/dL).  Liver Function Tests: Recent Labs  Lab  04/22/21 1509 04/23/21 0220 04/24/21 0433 04/25/21 0550  AST 44* 40 32 45*  ALT 13 11 9 14   ALKPHOS 109 96 85 97  BILITOT 7.0* 8.6* 7.2* 6.3*  PROT 5.2* 4.7* 4.8* 4.8*  ALBUMIN 2.3* 1.9* 2.5* 2.4*    CBG: Recent Labs  Lab 04/23/21 0340 04/23/21 0817 04/23/21 1208 04/23/21 1727 04/24/21 1946  GLUCAP 78 73 70 100* 91     Recent Results (from the past 240 hour(s))  Urine Culture     Status: Abnormal   Collection Time: 04/22/21  2:13 PM   Specimen: Urine, Catheterized  Result Value Ref Range Status   Specimen Description URINE, CATHETERIZED  Final   Special Requests   Final    NONE Performed  at [facility], [place] [address], [place], [place] [address]    Culture >=100,000 COLONIES/mL YEAST (A)  Final   Report Status 04/24/2021 FINAL  Final  Resp Panel by RT-PCR (Flu A&B, Covid) Nasopharyngeal Swab     Status: None   Collection Time: 04/22/21  7:36 PM   Specimen: Nasopharyngeal Swab; Nasopharyngeal(NP) swabs in vial transport medium  Result Value Ref Range Status   SARS Coronavirus 2 by RT PCR NEGATIVE NEGATIVE Final    Comment: (NOTE) SARS-CoV-2 target nucleic acids are NOT DETECTED.  The SARS-CoV-2 RNA is generally detectable in upper respiratory specimens during the acute phase of infection. The lowest concentration of SARS-CoV-2 viral copies this assay can detect is 138 copies/mL. A negative result does not preclude SARS-Cov-2 infection and should not be used as the sole basis for treatment or other patient management decisions. A negative result may occur with  improper specimen collection/handling, submission of specimen other than nasopharyngeal swab, presence of viral mutation(s) within the areas targeted by this assay, and inadequate number of viral copies(<138 copies/mL). A negative result must be combined with clinical observations, patient history, and epidemiological information. The expected result is Negative.  Fact Sheet for Patients:   EntrepreneurPulse.com.au  Fact Sheet for Healthcare Providers:  IncredibleEmployment.be  This test is no t yet approved or cleared by the [place] FDA and  has been authorized for detection and/or diagnosis of SARS-CoV-2 by FDA under an Emergency Use Authorization (EUA). This EUA will remain  in effect (meaning this test can be used) for the duration of the COVID-19 declaration under Section 564(b)(1) of the Act, 21 U.S.C.section 360bbb-3(b)(1), unless the authorization is terminated  or revoked sooner.       Influenza A by PCR NEGATIVE NEGATIVE Final   Influenza B by PCR NEGATIVE NEGATIVE Final    Comment: (NOTE) The Xpert Xpress SARS-CoV-2/FLU/RSV plus assay is intended as an aid in the diagnosis of influenza from Nasopharyngeal swab specimens and should not be used as a sole basis for treatment. Nasal washings and aspirates are unacceptable for Xpert Xpress SARS-CoV-2/FLU/RSV testing.  Fact Sheet for Patients: EntrepreneurPulse.com.au  Fact Sheet for Healthcare Providers: IncredibleEmployment.be  This test is not yet approved or cleared by the [place] FDA and has been authorized for detection and/or diagnosis of SARS-CoV-2 by FDA under an Emergency Use Authorization (EUA). This EUA will remain in effect (meaning this test can be used) for the duration of the COVID-19 declaration under Section 564(b)(1) of the Act, 21 U.S.C. section 360bbb-3(b)(1), unless the authorization is terminated or revoked.  Performed at [facility], [address], [place], [place] [address]   MRSA Next Gen by PCR, Nasal     Status: None   Collection Time: 04/22/21  9:38 PM   Specimen: Nasal Mucosa; Nasal Swab  Result Value Ref Range Status   MRSA by PCR Next Gen NOT DETECTED NOT DETECTED Final    Comment: (NOTE) The GeneXpert MRSA Assay (FDA approved for NASAL specimens only), is one component of a  comprehensive MRSA colonization surveillance program. It is not intended to diagnose MRSA infection nor to guide or monitor treatment for MRSA infections. Test performance is not FDA approved in patients less than 63 years old. Performed at [facility], [place] [address], [place], [place] [address]   Culture, blood (routine x 2)     Status: None (Preliminary result)   Collection Time: 04/23/21 12:11 PM   Specimen: BLOOD  Result Value Ref Range  Status   Specimen Description BLOOD SITE NOT SPECIFIED  Final   Special Requests   Final    BOTTLES DRAWN AEROBIC AND ANAEROBIC Blood Culture adequate volume   Culture   Final    NO GROWTH 2 DAYS Performed at [facility], [address], [place], [place] [address]    Report Status PENDING  Incomplete  Culture, blood (routine x 2)     Status: None (Preliminary result)   Collection Time: 04/23/21 12:35 PM   Specimen: BLOOD  Result Value Ref Range Status   Specimen Description BLOOD SITE NOT SPECIFIED  Final   Special Requests   Final    BOTTLES DRAWN AEROBIC AND ANAEROBIC Blood Culture adequate volume   Culture   Final    NO GROWTH 2 DAYS Performed at [facility], [address], [place], [place] [address]    Report Status PENDING  Incomplete  Peritoneal fluid culture w Gram Stain     Status: None (Preliminary result)   Collection Time: 04/23/21  4:49 PM   Specimen: Peritoneal Washings; Peritoneal Fluid  Result Value Ref Range Status   Specimen Description PERITONEAL  Final   Special Requests NONE  Final   Gram Stain   Final    FEW WBC PRESENT, PREDOMINANTLY MONONUCLEAR NO ORGANISMS SEEN    Culture   Final    NO GROWTH 2 DAYS Performed at [facility], [address] [place], [place] [address]    Report Status PENDING  Incomplete         Radiology Studies: DG CHEST PORT 1 VIEW  Result Date: 04/23/2021 CLINICAL DATA:  Central line placement EXAM: PORTABLE CHEST 1 VIEW COMPARISON:  April 22, 2021  FINDINGS: The heart size and mediastinal contours are within normal limits. Left central venous line is identified with distal tip in the superior vena cava. There is no pneumothorax. Mild atelectasis of left lung base is noted. The visualized skeletal structures are unremarkable. IMPRESSION: Left central venous line distal tip in the superior vena cava. No pneumothorax. Electronically Signed   By: Abelardo Diesel M.D.   On: 04/23/2021 12:19   ECHOCARDIOGRAM COMPLETE  Result Date: 04/23/2021    ECHOCARDIOGRAM REPORT   Patient Name:   Sharon Lawrence Date of Exam: 04/23/2021 Medical Rec #:  [id number]  Height:       60.0 in Accession #:    [id number] Weight:       181.4 lb Date of Birth:  [birth year]  BSA:          1.791 m Patient Age:    45 years   BP:           92/60 mmHg Patient Gender: F          HR:           93 bpm. Exam Location:  Inpatient Procedure: 2D Echo Indications:    abnormal ecg  History:        Patient has prior history of Echocardiogram examinations, most                 recent 01/07/2020.  Sonographer:    Johny Chess RDCS Referring Phys: [id number] [place]  1. Left ventricular ejection fraction, by estimation, is 60 to 65%. The left ventricle has normal function. The left ventricle has no regional wall motion abnormalities. Left ventricular diastolic parameters were normal.  2. Right ventricular systolic function is normal. The right ventricular size is normal.  3. The mitral valve is normal in structure. Trivial mitral valve regurgitation.  4. The aortic valve is normal in structure. Aortic valve regurgitation is not visualized. FINDINGS  Left Ventricle: Left ventricular ejection fraction, by estimation, is 60 to 65%. The left ventricle has normal function. The left ventricle has no regional wall motion abnormalities. The left ventricular internal cavity size was normal in size. There is  no left ventricular hypertrophy. Left ventricular diastolic parameters were normal. Right  Ventricle: The right ventricular size is normal. Right vetricular wall thickness was not well visualized. Right ventricular systolic function is normal. Left Atrium: Left atrial size was normal in size. Right Atrium: Right atrial size was normal in size. Pericardium: There is no evidence of pericardial effusion. Mitral Valve: The mitral valve is normal in structure. Trivial mitral valve regurgitation. Tricuspid Valve: The tricuspid valve is grossly normal. Tricuspid valve regurgitation is not demonstrated. Aortic Valve: The aortic valve is normal in structure. Aortic valve regurgitation is not visualized. Pulmonic Valve: The pulmonic valve was normal in structure. Pulmonic valve regurgitation is not visualized. Aorta: The aortic root and ascending aorta are structurally normal, with no evidence of dilitation. IAS/Shunts: The atrial septum is grossly normal.  LEFT VENTRICLE PLAX 2D LVIDd:         4.70 cm   Diastology LVIDs:         2.40 cm   LV e' medial:    11.20 cm/s LV PW:         0.90 cm   LV E/e' medial:  10.4 LV IVS:        0.70 cm   LV e' lateral:   13.90 cm/s LVOT diam:     1.90 cm   LV E/e' lateral: 8.4 LV SV:         77 LV SV Index:   43 LVOT Area:     2.84 cm  RIGHT VENTRICLE             IVC RV S prime:     17.80 cm/s  IVC diam: 1.70 cm TAPSE (M-mode): 4.2 cm LEFT ATRIUM             Index        RIGHT ATRIUM           Index LA diam:        3.70 cm 2.07 cm/m   RA Area:     12.10 cm LA Vol (A2C):   36.9 ml 20.60 ml/m  RA Volume:   27.50 ml  15.36 ml/m LA Vol (A4C):   36.3 ml 20.27 ml/m LA Biplane Vol: 38.1 ml 21.27 ml/m  AORTIC VALVE LVOT Vmax:   155.00 cm/s LVOT Vmean:  105.000 cm/s LVOT VTI:    0.272 m  AORTA Ao Root diam: 2.80 cm Ao Asc diam:  3.00 cm MITRAL VALVE MV Area (PHT): 4.36 cm     SHUNTS MV Decel Time: 174 msec     Systemic VTI:  0.27 m MV E velocity: 117.00 cm/s  Systemic Diam: 1.90 cm MV A velocity: 75.30 cm/s MV E/A ratio:  1.55 Mertie Moores MD Electronically signed by Mertie Moores  MD Signature Date/Time: 04/23/2021/3:37:57 PM    Final         Scheduled Meds:  Chlorhexidine Gluconate Cloth  6 each Topical Daily   lactulose  20 g Oral BID   mouth rinse  15 mL Mouth Rinse BID   midodrine  15 mg Oral TID WC   multivitamin with  minerals  1 tablet Oral Daily   OLANZapine  5 mg Oral BID   pantoprazole  40 mg Oral QHS   potassium chloride  40 mEq Oral BID   sodium chloride flush  10-40 mL Intracatheter Q12H   Continuous Infusions:  sodium chloride Stopped (04/23/21 0504)   cefTRIAXone (ROCEPHIN)  IV 1 g (04/24/21 1119)     LOS: 3 days        Hosie Poisson, MD [facility]   To contact the attending provider between 7A-7P or the covering provider during after hours 7P-7A, please log into the web site [email or web address] and access using universal [place] password for that web site. If you do not have the password, please call the hospital operator.  04/25/2021, 10:31 AM

## 2021-04-25 NOTE — TOC Initial Note (Addendum)
Transition of Care [facility]) - Initial/Assessment Note    Patient Details  Name: Sharon Lawrence MRN: [id number] Date of Birth: 1965/04/22  [facility]) CM/SW Contact:    Tom-Johnson, Renea Ee, RN Phone Number: 04/25/2021, 3:33 PM  Clinical Narrative:                  CM spoke with patient about discharge needs. Admitted for Acute Sepsis. Hgb on admission was 6.3. 1 unit PRBC given. Now hgb is 8.0. Currently on room air. Weaned off vasopressors. Progression of cirrhosis and Liver MIR recommended by GI. Patient states she is from home with her daughter, sister and her nephew. Not employed and currently working on getting disability. Has all necessary DME's at home except hospital bed. PCP is Sharon Spar, MD and uses [facility] in [place].  Patient does not have medical insurance. PT/OT recommended home health. Sharon Lawrence is the charity agency this week and CM notified Sharon Lawrence about home health disciplines. Sharon Lawrence states she will notify branch director to review and approve referral and will get back with CM. MATCH done for medication assistance. Will schedule followup appointment once patient is much ready for discharge. CM will continue to follow with needs.     Expected Discharge Plan: [place] Barriers to Discharge: Continued Medical Work up   Patient Goals and CMS Choice Patient states their goals for this hospitalization and ongoing recovery are:: To get better and return home. CMS Medicare.gov Compare Post Acute Care list provided to:: Patient Choice offered to / list presented to : Patient  Expected Discharge Plan and Services Expected Discharge Plan: [place]   Discharge Planning Services: CM Consult Post Acute Care Choice: [place] arrangements for the past 2 months: Single Family Home                 DME Arranged: N/A DME Agency: NA       HH Arranged: PT, OT [place] Agency: [place] ([place]) Date HH Agency Contacted: 04/25/21 Time [place]: 1530 Representative spoke with at [place]: Sharon Lawrence.  Prior Living Arrangements/Services Living arrangements for the past 2 months: Single Family Home Lives with:: Siblings, Adult Children (Lives with sister, daughter and nephew.) Patient language and need for interpreter reviewed:: Yes Do you feel safe going back to the place where you live?: Yes      Need for Family Participation in Patient Care: Yes (Comment) Care giver support system in place?: Yes (comment) Current home services: DME (All necesary DME's except hospital bed.) Criminal Activity/Legal Involvement Pertinent to Current Situation/Hospitalization: No - Comment as needed  Activities of Daily Living      Permission Sought/Granted Permission sought to share information with : Case Manager, Customer service manager, Family Supports Permission granted to share information with : Yes, Verbal Permission Granted  Share Information with NAME: Sharon Lawrence  Permission granted to share info w AGENCY: [facility]        Emotional Assessment Appearance:: Appears stated age Attitude/Demeanor/Rapport: Engaged, Gracious Affect (typically observed): Accepting, Appropriate, Calm, Hopeful Orientation: : Oriented to Self, Oriented to Place, Oriented to Situation Alcohol / Substance Use: Not Applicable Psych Involvement: No (comment)  Admission diagnosis:  Upper GI bleed [K92.2] Sepsis ([place]) [A41.9] Acute sepsis [facility]) [A41.9] Patient Active Problem List   Diagnosis Date Noted   Pressure injury of skin 04/23/2021   Acute sepsis ([place]) 04/22/2021  Hypokalemia    Thrombocytopenia (HCC)    Depression, recurrent ([place]) 01/11/2021   Coronary artery disease involving native coronary artery of native heart without angina pectoris 01/11/2021   Abnormal drug screen (10/18/2020) 12/31/2020   Marijuana use 12/31/2020   Methadone misuse  12/31/2020   Pain medication agreement broken (10/18/2020) 12/31/2020   Hypoalbuminemia due to protein-calorie malnutrition ([place]) 12/12/2020   Non-pressure chronic ulcer of unspecified part of right lower leg with unspecified severity ([place]) 07/11/2020   Chronic venous hypertension (idiopathic) with ulcer and inflammation of right lower extremity (CODE) ([place]) 07/11/2020   Chronic use of opiate for therapeutic purpose 06/13/2020   Benign neoplasm of colon 02/14/2020   Internal derangement of right knee 02/14/2020   Moderate persistent asthma with (acute) exacerbation 02/14/2020   Spondylosis without myelopathy or radiculopathy, lumbosacral region 12/09/2019   Lumbar facet synovial cyst (L5-S1) (Left) 12/09/2019   Generalized anxiety disorder 10/06/2018   Thrombocytosis 09/30/2018   Anemia 09/23/2018   Polyarthralgia 09/09/2018   DDD (degenerative disc disease), lumbar 12/29/2017   DDD (degenerative disc disease), thoracic 12/29/2017   Lumbar facet arthropathy (Bilateral) 12/29/2017   Neurogenic pain 12/29/2017   Chronic low back pain (Primary Area of Pain) (Bilateral) (R>L) w/ sciatica (Right) 12/03/2017   Chronic pain syndrome 12/03/2017   Long term current use of opiate analgesic 12/03/2017   Subclinical hypothyroidism 11/06/2017   Gastroesophageal reflux disease 11/06/2017   Bilateral lower extremity edema 11/06/2017   Benign essential hypertension 11/06/2017   Panic disorder 11/06/2017   Right-sided low back pain with sciatica 11/06/2017   Migraine 11/06/2017   Iron deficiency anemia 11/06/2017   Morbidly obese ([place]) 09/25/2017   Chronic diarrhea, with Celiac disease 09/25/2017   Former smoker    Hyperlipidemia 09/17/2017   Family hx of colon cancer 09/11/2017   Celiac disease 09/11/2017   Influenza vaccination declined 01/01/2017   Vaginal condyloma 06/22/2013   Obesity 06/22/2013   Fatty liver disease, nonalcoholic 11/55/2080   PCP:  Sharon Spar, MD Pharmacy:    [place], [place] - [address] [place] [address] Phone: [phone] Fax: [phone]     Social Determinants of Health (SDOH) Interventions    Readmission Risk Interventions No flowsheet data found.

## 2021-04-26 ENCOUNTER — Other Ambulatory Visit: Payer: Self-pay | Admitting: Internal Medicine

## 2021-04-26 LAB — CBC WITH DIFFERENTIAL/PLATELET
Abs Immature Granulocytes: 0.07 10*3/uL (ref 0.00–0.07)
Basophils Absolute: 0 10*3/uL (ref 0.0–0.1)
Basophils Relative: 0 %
Eosinophils Absolute: 0.1 10*3/uL (ref 0.0–0.5)
Eosinophils Relative: 1 %
HCT: 20.8 % — ABNORMAL LOW (ref 36.0–46.0)
Hemoglobin: 7.5 g/dL — ABNORMAL LOW (ref 12.0–15.0)
Immature Granulocytes: 1 %
Lymphocytes Relative: 24 %
Lymphs Abs: 2.9 10*3/uL (ref 0.7–4.0)
MCH: 29.1 pg (ref 26.0–34.0)
MCHC: 36.1 g/dL — ABNORMAL HIGH (ref 30.0–36.0)
MCV: 80.6 fL (ref 80.0–100.0)
Monocytes Absolute: 0.7 10*3/uL (ref 0.1–1.0)
Monocytes Relative: 5 %
Neutro Abs: 8.3 10*3/uL — ABNORMAL HIGH (ref 1.7–7.7)
Neutrophils Relative %: 69 %
Platelets: 93 10*3/uL — ABNORMAL LOW (ref 150–400)
RBC: 2.58 MIL/uL — ABNORMAL LOW (ref 3.87–5.11)
RDW: 17.1 % — ABNORMAL HIGH (ref 11.5–15.5)
WBC: 12.1 10*3/uL — ABNORMAL HIGH (ref 4.0–10.5)
nRBC: 0 % (ref 0.0–0.2)

## 2021-04-26 LAB — COMPREHENSIVE METABOLIC PANEL
ALT: 14 U/L (ref 0–44)
AST: 49 U/L — ABNORMAL HIGH (ref 15–41)
Albumin: 2.3 g/dL — ABNORMAL LOW (ref 3.5–5.0)
Alkaline Phosphatase: 103 U/L (ref 38–126)
Anion gap: 11 (ref 5–15)
BUN: 12 mg/dL (ref 6–20)
CO2: 20 mmol/L — ABNORMAL LOW (ref 22–32)
Calcium: 7.7 mg/dL — ABNORMAL LOW (ref 8.9–10.3)
Chloride: 101 mmol/L (ref 98–111)
Creatinine, Ser: 0.75 mg/dL (ref 0.44–1.00)
GFR, Estimated: >60 mL/min (ref >60)
Glucose, Bld: 95 mg/dL (ref 70–99)
Potassium: 3.3 mmol/L — ABNORMAL LOW (ref 3.5–5.1)
Sodium: 132 mmol/L — ABNORMAL LOW (ref 135–145)
Total Bilirubin: 6.5 mg/dL — ABNORMAL HIGH (ref 0.3–1.2)
Total Protein: 4.8 g/dL — ABNORMAL LOW (ref 6.5–8.1)

## 2021-04-26 LAB — BODY FLUID CULTURE W GRAM STAIN: Culture: NO GROWTH

## 2021-04-26 LAB — AMMONIA: Ammonia: 24 umol/L (ref 9–35)

## 2021-04-26 MED ORDER — POTASSIUM CHLORIDE CRYS ER 20 MEQ PO TBCR
40.0000 meq | EXTENDED_RELEASE_TABLET | Freq: Once | ORAL | Status: AC
  Administered 2021-04-26: 40 meq via ORAL
  Filled 2021-04-26: qty 2

## 2021-04-26 MED ORDER — LACTULOSE 10 GM/15ML PO SOLN
30.0000 g | Freq: Two times a day (BID) | ORAL | Status: DC
Start: 2021-04-26 — End: 2021-04-30
  Administered 2021-04-26 – 2021-04-30 (×7): 30 g via ORAL
  Filled 2021-04-26 (×8): qty 45

## 2021-04-26 NOTE — Progress Notes (Signed)
Patient transferred to 5W from [place] ICU. A&Ox4, follows commands and drowsy. Skin assessed with Gae Bon, RN. Placed on telemetry. Bruising and abrasions scattered with a stage 2 on sacral area. Foam dressing reinforced. Sister at bedside. Will continue to monitor and treat per MD orders.

## 2021-04-26 NOTE — Progress Notes (Signed)
PROGRESS NOTE    Sharon Lawrence  FIE:[id number] DOB: 1966-03-02 DOA: 04/22/2021 PCP: Lindell Spar, MD   Chief Complaint  Patient presents with   Weakness    Brief Narrative:  56 year old with prior history of coronary artery disease, STEMI in 2019, celiac disease, Sharon Lawrence, GI bleed, chronic pain syndrome, liver cirrhosis who presented with generalized weakness, nausea, vomiting and abdominal pain, admitted with undifferentiated shock septic/hypovolemic. Patient came off of vasopressors, SBP was ruled out with negative peritoneal fluid studies. Urine cultures show yeast.   Assessment & Plan:   Principal Problem:   Acute sepsis ([place]) Active Problems:   Hypokalemia   Thrombocytopenia ([facility])   Pressure injury of skin   Shock Differential includes sepsis versus hypovolemic shock. Urine cultures growing yeast. One dose of diflucan given.  Peritoneal fluid not consistent with SBP. Patient was weaned off vasopressors. Continue ceftriaxone to complete the course. Blood cultures negative so far.   Suspected decompensated cirrhosis with ascites with a history of NAFLD So far work-up is negative for autoimmune hepatitis and viral hepatitis.  Progression of cirrhosis in the last few months. Evidence of portal hypertension is seen. Liver MRI showed Hepatic lesions are difficult to accurately characterize and to definitively use LI-RADS criteria but suspicious for hepatomas. Moderate volume abdominal ascites and diffuse body wall edema. GI recommended liver biopsy, but she refused biopsy, agreed for a repeat MRI in 3 months.  AFP negative, mitochondrial antibody is negative. Alpha antitrypsin is 149.  Lactulose added and target about 2-3 bowel movements per day. She is more drowsy from the ativan given yesterday. Will get ammonia level today.  Increase lactulose 30 g BID.     Elevated BNP and anasarca probably secondary to hypoalbuminemia secondary to cirrhosis. Echocardiogram shows  hyperdynamic left ventricular ejection fraction. Mri liver showed Moderate volume abdominal ascites and diffuse body wall edema. Unable to start on diuretics due to low BP parameters.    Chronic bilateral lower extremity weakness Therapy evaluations ordered. Home health PT/OT.  Resume home medications. Outpatient neurology follow-up recommended.   Acute metabolic encephalopathy:  Possibly drug induced from ativan yesterday vs from hepatic encephalopathy. Getting ammonia levels.  Increase the lactulose dose.     Acute on chronic microcytic anemia and acute thrombocytopenia in the setting of liver cirrhosis and history of GI bleed in 2019 due to Brilinta S/p 1 unit of PRBC transfusion. Transfuse to keep hemoglobin greater than 7. Monitor for signs of bleeding. Acute thrombocytopenia probably secondary to decompensated cirrhosis    History of coronary artery disease, STEMI s/p PCI to RCA Patient currently denies any chest pain or shortness of breath. Restart home dose of aspirin and antihypertensives.  Pressures improved   History of celiac disease Continue with gluten-free diet   Unintentional weight loss Outpatient follow-up with PCP for appropriate cancer screening.   Pressure Injury  present on admission: Pressure Injury 04/22/21 Sacrum Right;Mid Stage 2 -  Partial thickness loss of dermis presenting as a shallow open injury with a red, pink wound bed without slough. Pink wound bed (Active)  04/22/21 2145  Location: Sacrum  Location Orientation: Right;Mid  Staging: Stage 2 -  Partial thickness loss of dermis presenting as a shallow open injury with a red, pink wound bed without slough.  Wound Description (Comments): Pink wound bed  Present on Admission: Yes   Wound care will be consulted.    Hyponatremia, hypokalemia  Replace potassium . Repeat in the morning.   Depression:  Flat affect, as per  caregiver,is non compliant to meds and gluten free diet. Does not  take care of herself.  Reports being depressed, and has refused to be put on anti depression meds in the past.  This morning she reports feeling depressed, denies suicidal ideation, has agreed to speak to psychiatry and take meds if needed. Psychiatry consulted. Will need to evaluate the patient to see if she has insight into her medical issues.  In view of her recurrent admissions, liver cirrhosis, non compliant to meds, diet, and care, palliative care consulted for goals of care discussions.     DVT prophylaxis: scd's Code Status: Full code.  Family Communication: none at bedside.  Disposition:   Status is: Inpatient Remains inpatient appropriate because: further work up for encephalopathy.            Consultants:  Gastroenterology.   Procedures: MRI liver.   Antimicrobials:  Antibiotics Given (last 72 hours)     Date/Time Action Medication Dose Rate   04/23/21 1323 New Bag/Given   cefTRIAXone (ROCEPHIN) 1 g in sodium chloride 0.9 % 100 mL IVPB 1 g 200 mL/hr   04/24/21 1119 New Bag/Given   cefTRIAXone (ROCEPHIN) 1 g in sodium chloride 0.9 % 100 mL IVPB 1 g 200 mL/hr   04/25/21 1159 New Bag/Given   cefTRIAXone (ROCEPHIN) 1 g in sodium chloride 0.9 % 100 mL IVPB 1 g 200 mL/hr        Subjective: No chest pain or sob, drowsy, no new complaints.  Objective: Vitals:   04/26/21 0900 04/26/21 1000 04/26/21 1100 04/26/21 1200  BP: (!) 94/58 99/69 96/60  103/69  Pulse: (!) 109 (!) 106 (!) 105 (!) 107  Resp: 20 (!) 23 (!) 22 (!) 23  Temp:      TempSrc:      SpO2:    100%  Weight:      Height:        Intake/Output Summary (Last 24 hours) at 04/26/2021 1304 Last data filed at 04/25/2021 2315 Gross per 24 hour  Intake 402.09 ml  Output 300 ml  Net 102.09 ml    Filed Weights   04/22/21 1410  Weight: 82.3 kg    Examination:  General exam: ill appearing lady, has anasarca Respiratory system: air entry fair, no wheezing heard.  Cardiovascular system: S1 & S2  heard, RRR.no JVD, pedal edema present.  Gastrointestinal system: Abdomen is soft, distended, bowel sounds wnl.  Central nervous system: drowsy, wakes up briefly to answer questions and goes to sleep.  Extremities: pedal edema.  Skin: pressure injury.  Psychiatry: flat affect      Data Reviewed: I have personally reviewed following labs and imaging studies  CBC: Recent Labs  Lab 04/22/21 1509 04/23/21 0220 04/24/21 0433 04/25/21 0550 04/26/21 0212  WBC 15.5* 10.2 10.4 10.5 12.1*  NEUTROABS 12.3*  --  7.1 6.8 8.3*  HGB 6.3* 9.0* 7.3* 8.0* 7.5*  HCT 17.9* 25.9* 20.6* 21.7* 20.8*  MCV 79.6* 80.7 79.8* 79.8* 80.6  PLT 137* 100* 106* 95* 93*     Basic Metabolic Panel: Recent Labs  Lab 04/23/21 0220 04/23/21 1250 04/24/21 0433 04/24/21 0823 04/25/21 0550 04/26/21 0212  NA 133* 133* 131*  --  133* 132*  K 2.9* 2.8* 3.5  --  3.1* 3.3*  CL 99 98 98  --  99 101  CO2 19* 21* 23  --  23 20*  GLUCOSE 82 76 94  --  84 95  BUN 15 14 12   --  11 12  CREATININE 0.80 0.90 0.85  --  0.76 0.75  CALCIUM 7.1* 7.2* 7.9*  --  7.9* 7.7*  MG 1.9  --   --  2.4  --   --   PHOS 2.5  --   --   --   --   --      GFR: Estimated Creatinine Clearance: 77.3 mL/min (by C-G formula based on SCr of 0.75 mg/dL).  Liver Function Tests: Recent Labs  Lab 04/22/21 1509 04/23/21 0220 04/24/21 0433 04/25/21 0550 04/26/21 0212  AST 44* 40 32 45* 49*  ALT 13 11 9 14 14   ALKPHOS 109 96 85 97 103  BILITOT 7.0* 8.6* 7.2* 6.3* 6.5*  PROT 5.2* 4.7* 4.8* 4.8* 4.8*  ALBUMIN 2.3* 1.9* 2.5* 2.4* 2.3*     CBG: Recent Labs  Lab 04/23/21 0340 04/23/21 0817 04/23/21 1208 04/23/21 1727 04/24/21 1946  GLUCAP 78 73 70 100* 91      Recent Results (from the past 240 hour(s))  Urine Culture     Status: Abnormal   Collection Time: 04/22/21  2:13 PM   Specimen: Urine, Catheterized  Result Value Ref Range Status   Specimen Description URINE, CATHETERIZED  Final   Special Requests   Final     NONE Performed at [facility], [place] [address], [place], [place] [address]    Culture >=100,000 COLONIES/mL YEAST (A)  Final   Report Status 04/24/2021 FINAL  Final  Resp Panel by RT-PCR (Flu A&B, Covid) Nasopharyngeal Swab     Status: None   Collection Time: 04/22/21  7:36 PM   Specimen: Nasopharyngeal Swab; Nasopharyngeal(NP) swabs in vial transport medium  Result Value Ref Range Status   SARS Coronavirus 2 by RT PCR NEGATIVE NEGATIVE Final    Comment: (NOTE) SARS-CoV-2 target nucleic acids are NOT DETECTED.  The SARS-CoV-2 RNA is generally detectable in upper respiratory specimens during the acute phase of infection. The lowest concentration of SARS-CoV-2 viral copies this assay can detect is 138 copies/mL. A negative result does not preclude SARS-Cov-2 infection and should not be used as the sole basis for treatment or other patient management decisions. A negative result may occur with  improper specimen collection/handling, submission of specimen other than nasopharyngeal swab, presence of viral mutation(s) within the areas targeted by this assay, and inadequate number of viral copies(<138 copies/mL). A negative result must be combined with clinical observations, patient history, and epidemiological information. The expected result is Negative.  Fact Sheet for Patients:  EntrepreneurPulse.com.au  Fact Sheet for Healthcare Providers:  IncredibleEmployment.be  This test is no t yet approved or cleared by the [place] FDA and  has been authorized for detection and/or diagnosis of SARS-CoV-2 by FDA under an Emergency Use Authorization (EUA). This EUA will remain  in effect (meaning this test can be used) for the duration of the COVID-19 declaration under Section 564(b)(1) of the Act, 21 U.S.C.section 360bbb-3(b)(1), unless the authorization is terminated  or revoked sooner.       Influenza A by PCR NEGATIVE NEGATIVE Final    Influenza B by PCR NEGATIVE NEGATIVE Final    Comment: (NOTE) The Xpert Xpress SARS-CoV-2/FLU/RSV plus assay is intended as an aid in the diagnosis of influenza from Nasopharyngeal swab specimens and should not be used as a sole basis for treatment. Nasal washings and aspirates are unacceptable for Xpert Xpress SARS-CoV-2/FLU/RSV testing.  Fact Sheet for Patients: EntrepreneurPulse.com.au  Fact Sheet for Healthcare Providers: IncredibleEmployment.be  This test is not yet approved or  cleared by the [place] and has been authorized for detection and/or diagnosis of SARS-CoV-2 by FDA under an Emergency Use Authorization (EUA). This EUA will remain in effect (meaning this test can be used) for the duration of the COVID-19 declaration under Section 564(b)(1) of the Act, 21 U.S.C. section 360bbb-3(b)(1), unless the authorization is terminated or revoked.  Performed at [facility], [address], [place], [place] [address]   MRSA Next Gen by PCR, Nasal     Status: None   Collection Time: 04/22/21  9:38 PM   Specimen: Nasal Mucosa; Nasal Swab  Result Value Ref Range Status   MRSA by PCR Next Gen NOT DETECTED NOT DETECTED Final    Comment: (NOTE) The GeneXpert MRSA Assay (FDA approved for NASAL specimens only), is one component of a comprehensive MRSA colonization surveillance program. It is not intended to diagnose MRSA infection nor to guide or monitor treatment for MRSA infections. Test performance is not FDA approved in patients less than 82 years old. Performed at [facility], [place] [address] [place], [place] [address]   Culture, blood (routine x 2)     Status: None (Preliminary result)   Collection Time: 04/23/21 12:11 PM   Specimen: BLOOD  Result Value Ref Range Status   Specimen Description BLOOD SITE NOT SPECIFIED  Final   Special Requests   Final    BOTTLES DRAWN AEROBIC AND ANAEROBIC Blood Culture adequate volume    Culture   Final    NO GROWTH 3 DAYS Performed at [facility], [address], [place], [place] [address]    Report Status PENDING  Incomplete  Culture, blood (routine x 2)     Status: None (Preliminary result)   Collection Time: 04/23/21 12:35 PM   Specimen: BLOOD  Result Value Ref Range Status   Specimen Description BLOOD SITE NOT SPECIFIED  Final   Special Requests   Final    BOTTLES DRAWN AEROBIC AND ANAEROBIC Blood Culture adequate volume   Culture   Final    NO GROWTH 3 DAYS Performed at [facility], [address] [place], [place] [address]    Report Status PENDING  Incomplete  Peritoneal fluid culture w Gram Stain     Status: None   Collection Time: 04/23/21  4:49 PM   Specimen: Peritoneal Washings; Peritoneal Fluid  Result Value Ref Range Status   Specimen Description PERITONEAL  Final   Special Requests NONE  Final   Gram Stain   Final    FEW WBC PRESENT, PREDOMINANTLY MONONUCLEAR NO ORGANISMS SEEN    Culture   Final    NO GROWTH 3 DAYS Performed at [facility], [address], [place], [place] [address]    Report Status 04/26/2021 FINAL  Final          Radiology Studies: MR LIVER W WO CONTRAST  Result Date: 04/25/2021 CLINICAL DATA:  Cirrhosis with liver lesions on prior CT scan. EXAM: MRI ABDOMEN WITHOUT AND WITH CONTRAST TECHNIQUE: Multiplanar multisequence MR imaging of the abdomen was performed both before and after the administration of intravenous contrast. CONTRAST:  8.30m GADAVIST GADOBUTROL 1 MMOL/ML IV SOLN COMPARISON:  CT scan 12/12/2020 FINDINGS: Examination is limited by body habitus and breathing motion artifact. Lower chest: The lung bases are clear of an acute process. No pleural or pericardial effusion. Hepatobiliary: Heterogeneous fatty infiltration of the liver is demonstrated. There are also cirrhotic changes which appear stable. No intrahepatic biliary dilatation. Gallbladder is  surgically absent. On the early arterial  phase enhancement sequence there are enhancing hepatic lesions. These are ill-defined and difficult to accurately characterize. 16 mm cortical or subcortical lesion and segment 4 B is faintly present on the portal venous phase sequence but I do not see it definitely after that. 11 mm lesion and segment 3 anteriorly is persistent on delayed sequences without definite washout. This also demonstrates slight increased T2 signal intensity and potentially could be a may angioma. 2 cm lesion and segment 3 laterally is best seen on the delayed sequence with there appears to be subcapsular enhancement. This is the most worrisome for a hepatoma but is indeterminate. Nonenhancing lesion and segment 6 is likely a cyst. Pancreas:  No mass, inflammation or ductal dilatation. Spleen:  Within normal limits in size.  No splenic lesions. Adrenals/Urinary Tract: The adrenal glands demonstrate small bilateral nodules which show loss of signal intensity on the out of phase gradient echo sequence and are likely benign adenomas. No change since 2019. No renal lesions or hydronephrosis. Stomach/Bowel: The stomach, duodenum, small bowel and colon grossly normal. Vascular/Lymphatic: The aorta and branch vessels are patent. The major venous structures are patent. No adenopathy. Other: Moderate volume abdominal ascites and diffuse body wall edema. Musculoskeletal: No significant bony findings. IMPRESSION: 1. Limited examination due to body habitus and breathing motion artifact. 2. Heterogeneous fatty infiltration of the liver and cirrhotic changes. 3. Hepatic lesions are difficult to accurately characterize and to definitively use LI-RADS criteria but suspicious for hepatomas. Recommend close observation. 4. Bilateral adrenal gland adenomas. 5. Moderate volume abdominal ascites and diffuse body wall edema. Electronically Signed   By: Marijo Sanes M.D.   On: 04/25/2021 19:32        Scheduled Meds:  Chlorhexidine Gluconate Cloth  6 each  Topical Daily   lactulose  30 g Oral BID   mouth rinse  15 mL Mouth Rinse BID   midodrine  15 mg Oral TID WC   multivitamin with minerals  1 tablet Oral Daily   OLANZapine  5 mg Oral BID   pantoprazole  40 mg Oral QHS   Continuous Infusions:  sodium chloride Stopped (04/23/21 0504)   cefTRIAXone (ROCEPHIN)  IV Stopped (04/25/21 1229)     LOS: 4 days        Hosie Poisson, MD [facility]   To contact the attending provider between 7A-7P or the covering provider during after hours 7P-7A, please log into the web site [email or web address] and access using universal [place] password for that web site. If you do not have the password, please call the hospital operator.  04/26/2021, 1:04 PM

## 2021-04-26 NOTE — Progress Notes (Signed)
Opened in error

## 2021-04-26 NOTE — Progress Notes (Signed)
PT Cancellation Note  Patient Details Name: Sharon Lawrence MRN: [id number] DOB: Mar 30, 1965   Cancelled Treatment:    Reason Eval/Treat Not Completed: (P) Patient at procedure or test/unavailable Pt being moved to new unit. PT will follow back tomorrow for therapy.  Daniele Yankowski B. Migdalia Dk PT, DPT Acute Rehabilitation Services Pager [phone] Office [phone]    [place] 04/26/2021, 4:20 PM

## 2021-04-26 NOTE — Progress Notes (Addendum)
Daily Rounding Note  04/26/2021, 11:24 AM  LOS: 4 days   SUBJECTIVE:   Chief complaint: Cirrhosis.  Anemia.  Celiac disease.  Planing of hip and lower back discomfort.  Very slight queasiness. Got Ativan for anxiety pre MRI yesterday and more drowsy, less alert since.    Small smear  of stool yesterday but no good BM for several days.     OBJECTIVE:         Vital signs in last 24 hours:    Temp:  [97.4 F (36.3 C)-98.9 F (37.2 C)] 98.9 F (37.2 C) (02/23 0748) Pulse Rate:  [91-112] 112 (02/23 0700) Resp:  [13-23] 23 (02/23 0700) BP: (86-136)/(49-84) 106/67 (02/23 0700) SpO2:  [97 %-100 %] 97 % (02/23 0700) Last BM Date : 04/25/21 Filed Weights   04/22/21 1410  Weight: 82.3 kg   General: Drowsy.  Jaundiced.  Looks chronically and acutely ill. Heart: RRR. Chest: Taking shallow breaths.  Somewhat dyspneic with speaking. Abdomen: Soft without tenderness Extremities: anasarca.   Swellin in R foot Neuro/Psych:  riented to hospital, 2023 and self.     Intake/Output from previous day: 02/22 0701 - 02/23 0700 In: 512.1 [P.O.:310; I.V.:2.1; IV Piggyback:200] Out: 550 [Urine:550]  Intake/Output this shift: No intake/output data recorded.  Lab Results: Recent Labs    04/24/21 0433 04/25/21 0550 04/26/21 0212  WBC 10.4 10.5 12.1*  HGB 7.3* 8.0* 7.5*  HCT 20.6* 21.7* 20.8*  PLT 106* 95* 93*   BMET Recent Labs    04/24/21 0433 04/25/21 0550 04/26/21 0212  NA 131* 133* 132*  K 3.5 3.1* 3.3*  CL 98 99 101  CO2 23 23 20*  GLUCOSE 94 84 95  BUN 12 11 12   CREATININE 0.85 0.76 0.75  CALCIUM 7.9* 7.9* 7.7*   LFT Recent Labs    04/24/21 0433 04/25/21 0550 04/26/21 0212  PROT 4.8* 4.8* 4.8*  ALBUMIN 2.5* 2.4* 2.3*  AST 32 45* 49*  ALT 9 14 14   ALKPHOS 85 97 103  BILITOT 7.2* 6.3* 6.5*   PT/INR Recent Labs    04/24/21 0433 04/24/21 1633  LABPROT 22.0* 21.8*  INR 1.9* 1.9*   Hepatitis  Panel No results for input(s): HEPBSAG, HCVAB, HEPAIGM, HEPBIGM in the last 72 hours.  Studies/Results: MR LIVER W WO CONTRAST  Result Date: 04/25/2021 CLINICAL DATA:  Cirrhosis with liver lesions on prior CT scan. EXAM: MRI ABDOMEN WITHOUT AND WITH CONTRAST TECHNIQUE: Multiplanar multisequence MR imaging of the abdomen was performed both before and after the administration of intravenous contrast. CONTRAST:  8.5m GADAVIST GADOBUTROL 1 MMOL/ML IV SOLN COMPARISON:  CT scan 12/12/2020 FINDINGS: Examination is limited by body habitus and breathing motion artifact. Lower chest: The lung bases are clear of an acute process. No pleural or pericardial effusion. Hepatobiliary: Heterogeneous fatty infiltration of the liver is demonstrated. There are also cirrhotic changes which appear stable. No intrahepatic biliary dilatation. Gallbladder is surgically absent. On the early arterial phase enhancement sequence there are enhancing hepatic lesions. These are ill-defined and difficult to accurately characterize. 16 mm cortical or subcortical lesion and segment 4 B is faintly present on the portal venous phase sequence but I do not see it definitely after that. 11 mm lesion and segment 3 anteriorly is persistent on delayed sequences without definite washout. This also demonstrates slight increased T2 signal intensity and potentially could be a may angioma. 2 cm lesion and segment 3 laterally is best seen on the delayed  sequence with there appears to be subcapsular enhancement. This is the most worrisome for a hepatoma but is indeterminate. Nonenhancing lesion and segment 6 is likely a cyst. Pancreas:  No mass, inflammation or ductal dilatation. Spleen:  Within normal limits in size.  No splenic lesions. Adrenals/Urinary Tract: The adrenal glands demonstrate small bilateral nodules which show loss of signal intensity on the out of phase gradient echo sequence and are likely benign adenomas. No change since 2019. No renal  lesions or hydronephrosis. Stomach/Bowel: The stomach, duodenum, small bowel and colon grossly normal. Vascular/Lymphatic: The aorta and branch vessels are patent. The major venous structures are patent. No adenopathy. Other: Moderate volume abdominal ascites and diffuse body wall edema. Musculoskeletal: No significant bony findings. IMPRESSION: 1. Limited examination due to body habitus and breathing motion artifact. 2. Heterogeneous fatty infiltration of the liver and cirrhotic changes. 3. Hepatic lesions are difficult to accurately characterize and to definitively use LI-RADS criteria but suspicious for hepatomas. Recommend close observation. 4. Bilateral adrenal gland adenomas. 5. Moderate volume abdominal ascites and diffuse body wall edema. Electronically Signed   By: Marijo Sanes M.D.   On: 04/25/2021 19:32    Scheduled Meds:  Chlorhexidine Gluconate Cloth  6 each Topical Daily   lactulose  20 g Oral BID   mouth rinse  15 mL Mouth Rinse BID   midodrine  15 mg Oral TID WC   multivitamin with minerals  1 tablet Oral Daily   OLANZapine  5 mg Oral BID   pantoprazole  40 mg Oral QHS   Continuous Infusions:  sodium chloride Stopped (04/23/21 0504)   cefTRIAXone (ROCEPHIN)  IV Stopped (04/25/21 1229)   PRN Meds:.sodium chloride, acetaminophen, ondansetron (ZOFRAN) IV, oxyCODONE, sodium chloride   ASSESMENT:   Cirrhosis, probably due to NASH.  Liver hypodensities noted on CT 12/2020.  AFP level normal 4.2..  MRI not yet completed.  Labs for metabolic and autoimmune sources of cirrhosis negative.  MR liver 04/2020: Limited exam due to body habitus and respiratory artifact, fatty and cirrhotic liver changes.  Hepatic lesions difficult to characterize but suspicious for hepatomas using LI-RADS criteria.  Also shows adrenal adenomas and moderate abdominal ascites, diffuse body wall edema.    Celiac disease.  On gluten free diet.    Nausea, vomiting, abdominal pain associated with hypotension.   Positive SIRS, sepsis criteria.  Growing E. coli and Proteus mirabilis from urineLevaquin/Flagyl >> Rocephin.  Improving lactic acid level  H. pylori negative gastritis, duodenitis, no varices on 07/2020 EGD.  Villous atrophy on duodenal biopsies.  ? Gastroparesis, did not tolerate Reglan due to tremors.  Gastric emptying study had been ordered last year but never completed.  On HS Protonix.      Anemia.  Acute on chronic.  2 PRBCs thus far.    Ascites, mild to moderate volume by ultrasound 04/2019.  1.2 L 04/24/21 Paracentesis, fluid studies not consistent with SBP.    Hyponatremia.      Hypokalemia.    Psychomotor slowing.  Possibly hepatic encephalopathy.  Ammonia level ordered, no priors for comparison.  May also be more lethargic today because of residual effects of Ativan given yesterday pre-MRI.   PLAN     Per Dr Lyndel Safe.      Uptitrate Lactulose.   If mental status remains blunted by tomorrow, consider adding rifaximin.    Azucena Freed  04/26/2021, 11:24 AM Phone [phone]     Attending physician's note   I have taken history, reviewed the chart  and examined the patient. I performed a substantive portion of this encounter, including complete performance of at least one of the key components, in conjunction with the APP. I agree with the Advanced Practitioner's note, impression and recommendations.   MRI liver yesterday: -liver lesions suspicious for hepatomas  -Underlying liver cirrhosis -Limited examination d/t body habitus and respiratory motion artifact.  AFP: Nl 4.2  I have offered Pt liver biopsy vs rpt MRI in 3 months. She has opted for repeat MRI in 3 months.  She is very clear that she does not want to go through biopsy at this time. Please note that she got Ativan prior to MRI.  Hence, I did not get in touch with IR regarding liver Bx   She understands that she needs to FU with Dr. Laural Golden as outpt I have instructed my office staff to send Dr. Laural Golden the MRI  report.  Will sign off for now. Pl call if any ?   Carmell Austria, MD [facility] [phone]

## 2021-04-26 NOTE — Consult Note (Signed)
[facility] Nurse Consult Note: Patient receiving care in [facility] 3M10 Consulted by V. Karleen Hampshire MD for pressure ulcer. This patient has been assessed by my partner L. McNichol, RN on 2/20 and placed orders for Xeroform gauze followed by foam dressing. After [place] with Bedside RN J. Alvera Singh, orders are sufficient and no further needs at this time. WOC will not follow.  Please re-consult for any further needs.  Cathlean Marseilles Tamala Julian, MSN, RN, [place], Lysle Pearl, [facility] Wound Treatment Associate Pager [phone]

## 2021-04-27 DIAGNOSIS — Z66 Do not resuscitate: Secondary | ICD-10-CM

## 2021-04-27 LAB — CBC WITH DIFFERENTIAL/PLATELET
Abs Immature Granulocytes: 0.06 10*3/uL (ref 0.00–0.07)
Basophils Absolute: 0 10*3/uL (ref 0.0–0.1)
Basophils Relative: 0 %
Eosinophils Absolute: 0.1 10*3/uL (ref 0.0–0.5)
Eosinophils Relative: 1 %
HCT: 20.7 % — ABNORMAL LOW (ref 36.0–46.0)
Hemoglobin: 7.2 g/dL — ABNORMAL LOW (ref 12.0–15.0)
Immature Granulocytes: 1 %
Lymphocytes Relative: 24 %
Lymphs Abs: 2.7 10*3/uL (ref 0.7–4.0)
MCH: 28.5 pg (ref 26.0–34.0)
MCHC: 34.8 g/dL (ref 30.0–36.0)
MCV: 81.8 fL (ref 80.0–100.0)
Monocytes Absolute: 0.5 10*3/uL (ref 0.1–1.0)
Monocytes Relative: 5 %
Neutro Abs: 7.7 10*3/uL (ref 1.7–7.7)
Neutrophils Relative %: 69 %
Platelets: 98 10*3/uL — ABNORMAL LOW (ref 150–400)
RBC: 2.53 MIL/uL — ABNORMAL LOW (ref 3.87–5.11)
RDW: 17 % — ABNORMAL HIGH (ref 11.5–15.5)
WBC: 11 10*3/uL — ABNORMAL HIGH (ref 4.0–10.5)
nRBC: 0 % (ref 0.0–0.2)

## 2021-04-27 LAB — COMPREHENSIVE METABOLIC PANEL
ALT: 16 U/L (ref 0–44)
AST: 52 U/L — ABNORMAL HIGH (ref 15–41)
Albumin: 2.3 g/dL — ABNORMAL LOW (ref 3.5–5.0)
Alkaline Phosphatase: 109 U/L (ref 38–126)
Anion gap: 9 (ref 5–15)
BUN: 13 mg/dL (ref 6–20)
CO2: 21 mmol/L — ABNORMAL LOW (ref 22–32)
Calcium: 8 mg/dL — ABNORMAL LOW (ref 8.9–10.3)
Chloride: 104 mmol/L (ref 98–111)
Creatinine, Ser: 0.74 mg/dL (ref 0.44–1.00)
GFR, Estimated: >60 mL/min (ref >60)
Glucose, Bld: 102 mg/dL — ABNORMAL HIGH (ref 70–99)
Potassium: 3.8 mmol/L (ref 3.5–5.1)
Sodium: 134 mmol/L — ABNORMAL LOW (ref 135–145)
Total Bilirubin: 6.7 mg/dL — ABNORMAL HIGH (ref 0.3–1.2)
Total Protein: 4.9 g/dL — ABNORMAL LOW (ref 6.5–8.1)

## 2021-04-27 LAB — PREPARE RBC (CROSSMATCH)

## 2021-04-27 MED ORDER — SERTRALINE HCL 25 MG PO TABS
25.0000 mg | ORAL_TABLET | Freq: Every day | ORAL | Status: AC
  Administered 2021-04-27: 25 mg via ORAL
  Filled 2021-04-27: qty 1

## 2021-04-27 MED ORDER — SERTRALINE HCL 25 MG PO TABS
25.0000 mg | ORAL_TABLET | Freq: Every day | ORAL | Status: DC
  Administered 2021-04-28 – 2021-05-10 (×13): 25 mg via ORAL
  Filled 2021-04-27 (×13): qty 1

## 2021-04-27 MED ORDER — SODIUM CHLORIDE 0.9% IV SOLUTION: Freq: Once | INTRAVENOUS | Status: AC

## 2021-04-27 NOTE — Consult Note (Signed)
[facility] New Face-to-Face Psychiatric Evaluation   Service Date: April 27, 2021 LOS: 5  Assessment  Sharon Lawrence is a 56 y.o. female admitted medically for 04/22/2021  2:13 PM for Sepsis [facility]). She carries the psychiatric diagnoses of MDD, GAD, panic disorder, alcohol abuse in remission, SA (choking herself as teen) without hospitalization and has a past medical history of CAD, Celiac disease, cirrhosis 2/2 previously presumed NASH, CAD, STEMI 2019.  Psychiatry was consulted for depression evaluation by Hosie Poisson, MD.  Her current presentation of guilt, hopelessness, hypersomnia, decreased appetite, decreased energy, apathy, anhedonia since 7-8 months ago is most consistent with MDD.  She also reported symptoms of excessive worrying and feeling overwhelmed because of sleep disturbances, muscle tension, fatigue, irritability, consistent with GAD.  She also reported being fearful of crowds and having panic attacks, will inquire further when patient is more alert to assess for possible agoraphobia and panic disorder. Patient is currently not on any psychotropic medicines, stated that it was due to not having insurance. On initial examination, patient was hypersomnolent, frequently falling asleep during exam, and exhibiting negative symptoms of depression.  Her symptoms are likely multifactorial, secondary to mother's death and poor health. Patient does not require inpatient psychiatric hospitalization as she denies SI/HI/AVH. We recommend starting low-dose Zoloft for MDD and GAD and titrating up as tolerated, while monitoring for hyponatremia and GI symptoms.   Please see plan below for detailed recommendations.  Diagnoses:  Principal Problem:   Acute sepsis ([place]) Active Problems:   Hypokalemia   Thrombocytopenia (HCC)   Pressure injury of skin  Plan  ## Safety and Observation Level:  - Based on my clinical evaluation, I estimate the patient to be at low risk of self  harm in the current setting - At this time, we recommend a routine level of observation. This decision is based on my review of the chart including patient's history and current presentation, interview of the patient, mental status examination, and consideration of suicide risk including evaluating suicidal ideation, plan, intent, suicidal or self-harm behaviors, risk factors, and protective factors. This judgment is based on our ability to directly address suicide risk, implement suicide prevention strategies and develop a safety plan while the patient is in the clinical setting. Please contact our team if there is a concern that risk level has changed.   ## Medications:  -- Started Zoloft 25 mg daily, with plans to titrate up as tolerated  ## Medical Decision Making Capacity:  -- Assessment not indicated at this time  ## Further Work-up:  -- Per primary team  -- Most recent EKG on Sunday April 22 2021 14:25:58 EST had QTc 510, HR 97   ## Disposition:  -- Per primary team pending clinical improvement  Thank you for this consult request. Recommendations have been communicated to the primary team.  We will follow at this time.   Merrily Brittle, DO  NEW history  Relevant Aspects of Hospital Course:  Admitted on 04/22/2021 for Sepsis [facility]).  Patient Report:  This AM patient was initially seen resting in bed comfortably. She was withdrawn and frequently falling asleep during encounter. Patient exhibited coherent and intermittently goal directed thought process. Patient reported that her admitted for "worsening health" over the last 7-8 months.  She reported symptoms of MDD including continuous depressed mood and pervasive sadness, anhedonia, hypersomnia, guilt, hopelessness, decreased energy, decreased concentration, decreased appetite, psychomotor slowing >2 weeks.  She stated that her mood began to worsen around 7-8 months  ago, near the death of her mom.  Reported that she died of lung  and bone cancer, that the family knew for a while.  Prior to that time, patient was functional and working as an Therapist, sports. Patient described their mood as "feeling even right now" but generally depressed and anxious. She reported poor sleep, with difficulties falling asleep, staying asleep and is hypersomnolent during the day. Describes poor appetite as well. The patient stated that she felt guilty about not being there fore mom more before her death. "I want to see my mom". Worried about dying and leaving her children.   Patient denied SI/HI/AVH. Patient was not grossly responding to internal/external stimuli during encounter.  Patient was amenable to the plan discussed and had no other concerns.  ROS:  MDD (+) per above.   Patient reported symptoms of generalized anxiety including having difficulty controlling/managing anxiety, that it is out of proportion with stressors, and that it caused feelings of restlessness or being on edge, easily fatigued, concentration difficulty, irritability, muscle tension, and sleep disturbance >72month.   Patient also reported symptoms of possible agoraphobia, stated that she becomes very anxious and has panic attacks in crowds, stated she feels like she cannot escape from.   When inquiring about history of trauma, patient declined to talk about it.  Patient denied symptoms of mania/hypomania including ever having excessive energy despite decreased need for sleep, distractibility/inattention, sexual indiscretion, grandiosity/inflated self-esteem, flight of ideas, racing thoughts, pressured speech, severe agitation/irritability, or increased goal directed activity (<2hr/night [phone].   Patient denied symptoms of psychosis including AVH, delusions, paranoia, or first rank symptoms.   Collateral information:  2/24: Dad approached and stated that the patient's acute decline in health started around the death of her mom, and that at home she exhibited vegetative symptoms  including hypersomnia, anhedonia, apathy.  Psychiatric History:  Information collected from chart review and patient interaction Past psych diagnoses:  Per chart panic disorder, GAD, methadone misuse, MDD, cannabis abuse, tobacco abuse Prior inpatient treatment: Denied Suicide attempts: Reported "trying to choke herself when she was a teen" Psychiatric med trials:  Per chart Zoloft, Paxil, Effexor, Cymbalta Current outpatient psychiatrist: Denied Current outpatient therapist: Denied  Family psych history:  Deferred given patient's somnolence Completed/attempted suicide: Unable to inquire Bipolar spectrum disorder: Unable to inquire Schizophrenia spectrum disorder: Unable to inquire Substance abuse: Unable to inquire   Social History:  Abuse: Declined to answer Marital Status: Widowed Children:  Yes Income: Currently unemployed, was an RTherapist, 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 Living Arrangements: Other relatives (daughter and sister) Available Help at Discharge: Family; Available 24 hours/day Type of Home: House Home Layout: One level Home Access: Level entry Bathroom Shower/Tub: WMultimedia programmer Handicapped height   Substance Abuse History Per chart, patient reports that she quit smoking about 4 years ago. Her smoking use included cigarettes. She has a 3.00 pack-year smoking history. She has never used smokeless tobacco. She reports that she does not drink alcohol and does not use drugs.  Residential Rehab: Unable to inquire Detox: Unable to inquire Seizures: Unable to inquire   Family History:  The patient's family history includes Anxiety disorder in her sister; Bipolar disorder in her son; CAD in her maternal grandfather and paternal grandmother; Cancer in her mother and sister; Cirrhosis in her paternal grandfather; Depression in her  sister; Diabetes in her paternal grandmother;  Drug abuse in her son; Hypertension in her father and mother; Hypothyroidism in her father and paternal grandmother.  Medical History: Past Medical History:  Diagnosis Date   Acute ST elevation myocardial infarction (STEMI) of inferior wall ([place]) 09/15/2017   ADHD (attention deficit hyperactivity disorder)    Anxiety    B12 deficiency 02/07/2011   Back pain    CAD (coronary artery disease)    a. s/p recent STEMI on 09/15/2017 with DES to RCA   Celiac disease    Folic acid deficiency 82/07/35   GI bleed 11/06/2017   Headache    History of ST elevation myocardial infarction (STEMI) 11/06/2017   Iron deficiency anemia 02/07/2011   Myocardial infarction ([place]) 09/15/2017   Syncope and collapse 11/06/2017    Surgical History: Past Surgical History:  Procedure Laterality Date   BIOPSY  07/12/2020   Procedure: BIOPSY;  Surgeon: Rogene Houston, MD;  Location: [facility];  Service: Endoscopy;;  duodenum(2nd part);antral   CHOLECYSTECTOMY     COLONOSCOPY WITH PROPOFOL N/A 09/26/2017   rehman examined portion of ileum normal, - Five small, non-bleeding polyps in the rectum, in the sigmoid colon and at the hepatic   CORONARY STENT INTERVENTION Right 09/15/2017   Procedure: CORONARY STENT INTERVENTION;  Surgeon: Jettie Booze, MD;  Location: [place] [facility] LAB;  Service: Cardiovascular;  Laterality: Right;  RCA   ESOPHAGOGASTRODUODENOSCOPY (EGD) WITH PROPOFOL N/A 07/12/2020   rehman: normal hypopharynx, Normal esophagus. z line regular 35 cm from incisors, congested and erythematous mucosa in antrum, mucosal changes in duodenum, very pronounced changes to suggest active celiac disease   KNEE CARTILAGE SURGERY Right    LEFT HEART CATH AND CORONARY ANGIOGRAPHY N/A 09/15/2017   Procedure: LEFT HEART CATH AND CORONARY ANGIOGRAPHY;  Surgeon: Jettie Booze, MD;  Location: [facility];  Service: Cardiovascular;  Laterality: N/A;    TONSILLECTOMY      Medications:   Current Facility-Administered Medications:    0.9 %  sodium chloride infusion (Manually program via Guardrails IV Fluids), , Intravenous, Once, Hosie Poisson, MD   0.9 %  sodium chloride infusion, , Intravenous, PRN, Margaretha Seeds, MD, Paused at 04/23/21 [phone]   acetaminophen (TYLENOL) tablet 650 mg, 650 mg, Oral, Q6H PRN, Jacky Kindle, MD   cefTRIAXone (ROCEPHIN) 1 g in sodium chloride 0.9 % 100 mL IVPB, 1 g, Intravenous, Q24H, Chand, Sudham, MD, Last Rate: 200 mL/hr at 04/27/21 1150, 1 g at 04/27/21 1150   lactulose (CHRONULAC) 10 GM/15ML solution 30 g, 30 g, Oral, BID, Vena Rua, PA-C, 30 g at 04/27/21 8916   MEDLINE mouth rinse, 15 mL, Mouth Rinse, BID, Chand, Sudham, MD, 15 mL at 04/27/21 0844   midodrine (PROAMATINE) tablet 15 mg, 15 mg, Oral, TID WC, Jose Persia, MD, 15 mg at 04/27/21 1138   multivitamin with minerals tablet 1 tablet, 1 tablet, Oral, Daily, Chand, Sudham, MD   OLANZapine (ZYPREXA) tablet 5 mg, 5 mg, Oral, BID, Chand, Sudham, MD, 5 mg at 04/27/21 0833   ondansetron (ZOFRAN) injection 4 mg, 4 mg, Intravenous, Q8H PRN, Eliezer Bottom T, MD, 4 mg at 04/27/21 0911   oxyCODONE (Oxy IR/ROXICODONE) immediate release tablet 5 mg, 5 mg, Oral, Q6H PRN, Jacky Kindle, MD, 5 mg at 04/27/21 0843   pantoprazole (PROTONIX) EC tablet 40 mg, 40 mg, Oral, QHS, Chand, Sudham, MD, 40 mg at 04/26/21 2201   [COMPLETED] sertraline (ZOLOFT) tablet 25 mg, 25 mg, Oral, QPC lunch, 25 mg at 04/27/21 1138 **FOLLOWED  BY** [START ON 04/28/2021] sertraline (ZOLOFT) tablet 25 mg, 25 mg, Oral, QPC breakfast, Merrily Brittle, DO   sodium chloride (OCEAN) 0.65 % nasal spray 1 spray, 1 spray, Each Nare, PRN, Jacky Kindle, MD  Allergies: Allergies  Allergen Reactions   Bee Venom Anaphylaxis   Erythromycin Anaphylaxis   Cephalexin    Neomycin-Bacitracin-Polymyxin  [Bacitracin-Neomycin-Polymyxin] Swelling    Sight of application   Neosporin Original  [Bacitracin-Neomycin-Polymyxin] Swelling    Swelling at site of application   Prednisone    Sulfa Antibiotics Swelling    Sulfa eye drops caused eyes to swell Sulfa eye drops caused eyes to swell   Tobrex [Tobramycin] Swelling   Tramadol Hives   Penicillins Rash    Has patient had a PCN reaction causing immediate rash, facial/tongue/throat swelling, SOB or lightheadedness with hypotension: Yes Has patient had a PCN reaction causing severe rash involving mucus membranes or skin necrosis: No Has patient had a PCN reaction that required hospitalization: No Has patient had a PCN reaction occurring within the last 10 years: No If all of the above answers are "NO", then may proceed with Cephalosporin use.     Objective  Psychiatric Specialty Exam: Physical Exam Vitals and nursing note reviewed.  Constitutional:      General: She is not in acute distress.    Appearance: She is ill-appearing. She is not diaphoretic.  HENT:     Head: Normocephalic.  Pulmonary:     Effort: No respiratory distress.  Neurological:     General: No focal deficit present.     Mental Status: She is lethargic.     Review of Systems  Constitutional:  Positive for malaise/fatigue.  Respiratory:  Positive for shortness of breath.   Gastrointestinal:  Positive for abdominal pain and nausea.    Body mass index is 35.43 kg/m. Temp:  [97.8 F (36.6 C)-99 F (37.2 C)] 98 F (36.7 C) (02/24 1229) Pulse Rate:  [93-101] 101 (02/24 1229) Cardiac Rhythm: Sinus tachycardia (02/24 0700) Resp:  [14-20] 15 (02/24 1229) BP: (92-131)/(59-77) 131/77 (02/24 1229) SpO2:  [100 %] 100 % (02/24 1229)  General Appearance: -- (unkempt, laying in hospital bed, unwell appearing, mildly jaundiced skin, mouth breathing)    Eye Contact: Minimal    Speech: Slow (delayed, minimally responsive, mumbling at times)    Volume: Decreased    Mood: Depressed; Anxious ("feeling even right now")   Affect: Tearful; Blunt;  Depressed    Thought Process: Goal Directed; Coherent (vague)  Descriptions of Associations: Circumstantial  Duration of Psychotic Symptoms:No data recorded Past Diagnosis of Schizophrenia or Psychoactive disorder:No data recorded  Orientation: Full (Time, Place and Person)   Thought Content: Rumination (felt guilty about not being there fore mom more before her death. "I want to see my mom". Worried about dying and leaving her children.)  Hallucinations: Hallucinations: None (Denied AVH)  Ideas of Reference: None   Suicidal Thoughts: No  Suicidal Thoughts: No  Homicidal Thoughts: No  Homicidal Thoughts: No   Memory: Recent Poor; Immediate Fair (limited given somnolence)    Judgement: Fair  Insight: Lacking    Psychomotor Activity: Decreased; Psychomotor Retardation (would fidget with blanket intermittently)    Concentration: Poor (falling asleep during evaluation)  Attention Span: Poor (falling asleep)   Recall: Poor    Fund of Knowledge: Fair    Language: Fair    Handed: Right    Assets: Leisure Time; Social Support; Housing    Sleep: Poor (sleep latency, multiple night awakening, hypersomnia during the day)  AIMS:   , ,  ,  ,     CIWA:  COWS:      Signed: Merrily Brittle, DO Psychiatry Resident, PGY-1 [place] 04/27/2021, 2:35 PM

## 2021-04-27 NOTE — TOC Progression Note (Signed)
Transition of Care [facility]) - Progression Note    Patient Details  Name: Sharon Lawrence MRN: [id number] Date of Birth: 1965-11-06  Transition of Care [facility]) CM/SW Contact  Jacalyn Lefevre Edson Snowball, RN Phone Number: 04/27/2021, 10:58 AM  Clinical Narrative:     Followed up with Charlane with [facility] regarding home health services. They are unable to accept referral.   Expected Discharge Plan: [place] Barriers to Discharge: Continued Medical Work up  Expected Discharge Plan and Services Expected Discharge Plan: [place]   Discharge Planning Services: CM Consult Post Acute Care Choice: [place] arrangements for the past 2 months: Single Family Home                 DME Arranged: N/A DME Agency: NA       HH Arranged: PT, OT [place] Agency: [place] ([place]) Date HH Agency Contacted: 04/25/21 Time [place]: 1530 Representative spoke with at [place]: [place]- Will Administrator, arts for approval.   Social Determinants of Health (SDOH) Interventions    Readmission Risk Interventions No flowsheet data found.

## 2021-04-27 NOTE — Progress Notes (Signed)
Occupational Therapy Treatment Patient Details Name: Sharon Lawrence MRN: [id number] DOB: 24-Jan-1966 Today's Date: 04/27/2021   History of present illness Pt presented to [facility] for nausea and vomiting on 2/19. Pt with septic shock and UTI. PMH - STEMI, ADHD, anxiety, chronic pain, CAD, Celiac disease,   OT comments  Chart reviewed, RN cleared pt for participation in OT tx session. Tx session targeted improving tolerance and endurance for functional mobility/ADL tasks. Pt reports and appears with increased weakness on this date. Pt unable to tolerate seated at EOB for more than 30 seconds, citing dizziness which is a decrease in performance level as compared to previous sessions. RN notified of pt continued symptoms. BP WNL prior to and following activity, HR up to 114 with activity. Pt is left as received, all needs met. OT will continue to follow acutely.    Recommendations for follow up therapy are one component of a multi-disciplinary discharge planning process, led by the attending physician.  Recommendations may be updated based on patient status, additional functional criteria and insurance authorization.    Follow Up Recommendations  Home health OT    Assistance Recommended at Discharge Frequent or constant Supervision/Assistance  Patient can return home with the following  A lot of help with walking and/or transfers;A lot of help with bathing/dressing/bathroom;Assist for transportation   Equipment Recommendations  None recommended by OT    Recommendations for Other Services      Precautions / Restrictions Precautions Precautions: Fall Precaution Comments: watch BP Restrictions Weight Bearing Restrictions: No       Mobility Bed Mobility Overal bed mobility: Needs Assistance Bed Mobility: Supine to Sit, Sit to Supine     Supine to sit: Mod assist, HOB elevated Sit to supine: Max assist, HOB elevated        Transfers                         Balance Overall  balance assessment: Needs assistance Sitting-balance support: Bilateral upper extremity supported, Feet supported Sitting balance-Leahy Scale: Zero Sitting balance - Comments: MAX A for static sitting EOB                                   ADL either performed or assessed with clinical judgement   ADL Overall ADL's : Needs assistance/impaired     Grooming: Set up;Bed level Grooming Details (indicate cue type and reason): pt unable to tolerate EOB or OOB grooming tasks                               General ADL Comments: supine>sit with MOD A, mantained seated at edge of bed for approx 30 seconds with MAX A for static sitting balance; MAX A for sit>supine    Extremity/Trunk Assessment              Vision       Perception     Praxis      Cognition Arousal/Alertness: Awake/alert Behavior During Therapy: WFL for tasks assessed/performed Overall Cognitive Status: Impaired/Different from baseline                                 General Comments: slow processing noted on this date        Exercises  Shoulder Instructions       General Comments prior to activity: HR 104, spo2 100% BP 119/77; After seated at edge of bed (unable to get EOB reading) BP132/87, HR114, spo2 100%    Pertinent Vitals/ Pain       Pain Assessment Pain Assessment: 0-10 Pain Score: 6  Pain Location: back Pain Descriptors / Indicators: Discomfort Pain Intervention(s): Limited activity within patient's tolerance, Utilized relaxation techniques, Repositioned  Home Living                                          Prior Functioning/Environment              Frequency  Min 2X/week        Progress Toward Goals  OT Goals(current goals can now be found in the care plan section)  Progress towards OT goals: Not progressing toward goals - comment (will continue to assess)  Acute Rehab OT Goals Patient Stated Goal: sit on edge  of bed OT Goal Formulation: With patient Time For Goal Achievement: 05/04/21 Potential to Achieve Goals: [place] Discharge plan remains appropriate    Co-evaluation                 AM-PAC OT "6 Clicks" Daily Activity     Outcome Measure   Help from another person eating meals?: None Help from another person taking care of personal grooming?: A Little Help from another person toileting, which includes using toliet, bedpan, or urinal?: A Lot Help from another person bathing (including washing, rinsing, drying)?: A Lot Help from another person to put on and taking off regular upper body clothing?: A Little Help from another person to put on and taking off regular lower body clothing?: A Lot 6 Click Score: 16    End of Session    OT Visit Diagnosis: Unsteadiness on feet (R26.81);Pain;Muscle weakness (generalized) (M62.81);Other abnormalities of gait and mobility (R26.89);Other symptoms and signs involving the nervous system (R29.898)   Activity Tolerance Patient limited by fatigue   Patient Left in bed;with call bell/phone within reach;with bed alarm set   Nurse Communication Mobility status        Time: 4174-0814 OT Time Calculation (min): 17 min  Charges: OT General Charges $OT Visit: 1 Visit OT Treatments $Therapeutic Activity: 8-22 mins Shanon Payor, OTD OTR/L  04/27/21, 3:05 PM

## 2021-04-27 NOTE — Progress Notes (Signed)
Physical Therapy Treatment Patient Details Name: Sharon Lawrence MRN: [id number] DOB: March 08, 1965 Today's Date: 04/27/2021   History of Present Illness Pt presented to [facility] for nausea and vomiting on 2/19. Pt with septic shock and UTI. PMH - STEMI, ADHD, anxiety, chronic pain, CAD, Celiac disease,    PT Comments    Pt received in bed with reports of not feeling well. She required mod assist supine to sit. Upon sitting EOB pt with c/o dizziness and demonstrating unsteady sitting balance. Pt fearful and reporting inability to attempt transfer to recliner. Mod assist for return to supine. Of note, Hgb 7.2 this am, down from 8.0 2 days ago. BP 104/76 in supine. HOB elevated to 40 degrees for pt to eat breakfast. PT to continue per POC.    Recommendations for follow up therapy are one component of a multi-disciplinary discharge planning process, led by the attending physician.  Recommendations may be updated based on patient status, additional functional criteria and insurance authorization.  Follow Up Recommendations  Home health PT     Assistance Recommended at Discharge Frequent or constant Supervision/Assistance  Patient can return home with the following A lot of help with walking and/or transfers;A lot of help with bathing/dressing/bathroom   Equipment Recommendations  None recommended by PT    Recommendations for Other Services       Precautions / Restrictions Precautions Precautions: Fall;Other (comment) Precaution Comments: watch BP     Mobility  Bed Mobility Overal bed mobility: Needs Assistance Bed Mobility: Supine to Sit, Sit to Supine     Supine to sit: Mod assist, HOB elevated Sit to supine: Mod assist, HOB elevated   General bed mobility comments: +rail, increased time, use of bed pad to scoot to EOB    Transfers Overall transfer level: Needs assistance                 General transfer comment: unable to progress beyond EOB due to dizziness, weakness     Ambulation/Gait                   Stairs             Wheelchair Mobility    Modified Rankin (Stroke Patients Only)       Balance Overall balance assessment: Needs assistance Sitting-balance support: Bilateral upper extremity supported, Feet supported Sitting balance-Leahy Scale: Poor Sitting balance - Comments: min assist to maintain balance EOB                                    Cognition Arousal/Alertness: Awake/alert Behavior During Therapy: WFL for tasks assessed/performed Overall Cognitive Status: Within Functional Limits for tasks assessed                                          Exercises      General Comments General comments (skin integrity, edema, etc.): BP 104/76 in supine. HR 105      Pertinent Vitals/Pain Pain Assessment Pain Assessment: Faces Faces Pain Scale: Hurts little more Pain Location: back Pain Descriptors / Indicators: Discomfort, Grimacing, Guarding Pain Intervention(s): Limited activity within patient's tolerance, Monitored during session, Repositioned    Home Living  Prior Function            PT Goals (current goals can now be found in the care plan section) Acute Rehab PT Goals Patient Stated Goal: home Progress towards PT goals: Not progressing toward goals - comment (dizziness, low hgb)    Frequency    Min 3X/week      PT Plan Current plan remains appropriate    Co-evaluation              AM-PAC PT "6 Clicks" Mobility   Outcome Measure  Help needed turning from your back to your side while in a flat bed without using bedrails?: A Little Help needed moving from lying on your back to sitting on the side of a flat bed without using bedrails?: A Lot Help needed moving to and from a bed to a chair (including a wheelchair)?: Total Help needed standing up from a chair using your arms (e.g., wheelchair or bedside chair)?: Total Help  needed to walk in hospital room?: Total Help needed climbing 3-5 steps with a railing? : Total 6 Click Score: 9    End of Session Equipment Utilized During Treatment: Gait belt Activity Tolerance: Treatment limited secondary to medical complications (Comment) (dizziness, Hgb 7.2) Patient left: in bed;with call bell/phone within reach;with bed alarm set Nurse Communication: Mobility status;Need for lift equipment PT Visit Diagnosis: Unsteadiness on feet (R26.81);Other abnormalities of gait and mobility (R26.89);History of falling (Z91.81)     Time: 7001-7494 PT Time Calculation (min) (ACUTE ONLY): 15 min  Charges:  $Therapeutic Activity: 8-22 mins                     Lorrin Goodell, PT  Office # [phone] Pager [phone]    Lorriane Shire 04/27/2021, 8:56 AM

## 2021-04-27 NOTE — Progress Notes (Signed)
PROGRESS NOTE    Sharon Lawrence  SWN:[id number] DOB: [birth year] DOA: 04/22/2021 PCP: Lindell Spar, MD   Chief Complaint  Patient presents with   Weakness    Brief Narrative:  56 year old with prior history of coronary artery disease, STEMI in 2019, celiac disease, Sharon Lawrence, GI bleed, chronic pain syndrome, liver cirrhosis who presented with generalized weakness, nausea, vomiting and abdominal pain, admitted with undifferentiated shock septic/hypovolemic. Patient came off of vasopressors, SBP was ruled out with negative peritoneal fluid studies. Urine cultures show yeast.   Assessment & Plan:   Principal Problem:   Acute sepsis ([place]) Active Problems:   Hypokalemia   Thrombocytopenia ([facility])   Pressure injury of skin   Shock Differential includes sepsis versus hypovolemic shock. Urine cultures growing yeast. One dose of diflucan given.  Peritoneal fluid not consistent with SBP. Patient was weaned off vasopressors. Continue ceftriaxone to complete the course. D/c antibiotics after tomorrow dose.  Blood cultures negative so far.   Suspected decompensated cirrhosis with ascites with a history of NAFLD So far work-up is negative for autoimmune hepatitis and viral hepatitis.  Progression of cirrhosis in the last few months. Evidence of portal hypertension is seen. Liver MRI showed Hepatic lesions are difficult to accurately characterize and to definitively use LI-RADS criteria but suspicious for hepatomas. Moderate volume abdominal ascites and diffuse body wall edema. GI recommended liver biopsy, but she refused biopsy, agreed for a repeat MRI in 3 months.  AFP negative, mitochondrial antibody is negative. Alpha antitrypsin is 149.  Lactulose added and target about 2-3 bowel movements per day. Ammonia levels wnl. 2 bowel movements in the last 24 hours.     Elevated BNP and anasarca probably secondary to hypoalbuminemia secondary to cirrhosis. Echocardiogram shows hyperdynamic left  ventricular ejection fraction. Mri liver showed Moderate volume abdominal ascites and diffuse body wall edema. Unable to start on diuretics due to low BP parameters.    Chronic bilateral lower extremity weakness Therapy evaluations ordered. Home health PT/OT.  Resume home medications. Outpatient neurology follow-up recommended.   Acute metabolic encephalopathy:  Possibly drug induced from ativan yesterday vs from hepatic encephalopathy. Getting ammonia levels.  Increase the lactulose dose.  Pt remains lethargic, but answering all questions appropriately.     Acute on chronic microcytic anemia and acute thrombocytopenia in the setting of liver cirrhosis and history of GI bleed in 2019 due to Brilinta S/p 1 unit of PRBC transfusion. Hemoglobin dropped to 7.2.  Transfuse to keep hemoglobin greater than 7. Monitor for signs of bleeding. Acute thrombocytopenia probably secondary to decompensated cirrhosis    History of coronary artery disease, STEMI s/p PCI to RCA Patient currently denies any chest pain or shortness of breath. Restart home dose of aspirin and antihypertensives once pressures improve.    History of celiac disease Continue with gluten-free diet   Unintentional weight loss Outpatient follow-up with PCP for appropriate cancer screening.   Pressure Injury  present on admission: Pressure Injury 04/22/21 Sacrum Right;Mid Stage 2 -  Partial thickness loss of dermis presenting as a shallow open injury with a red, pink wound bed without slough. Pink wound bed (Active)  04/22/21 2145  Location: Sacrum  Location Orientation: Right;Mid  Staging: Stage 2 -  Partial thickness loss of dermis presenting as a shallow open injury with a red, pink wound bed without slough.  Wound Description (Comments): Pink wound bed  Present on Admission: Yes   Wound care will be consulted.    Hyponatremia, hypokalemia  Replaced.  Depression:  Flat affect, as per caregiver,is non  compliant to meds and gluten free diet. Does not take care of herself.  Reports being depressed, and has refused to be put on anti depression meds in the past.  This morning she reports feeling depressed, denies suicidal ideation, has agreed to speak to psychiatry and take meds if needed. Psychiatry consulted. Will need to evaluate the patient to see if she has insight into her medical issues.  In view of her recurrent admissions, liver cirrhosis, non compliant to meds, diet, and care, palliative care consulted for goals of care discussions.  Psychiatry evaluated and started her on zoloft. Appreciate their help.     DVT prophylaxis: scd's Code Status: Full code.  Family Communication: none at bedside.  Disposition:   Status is: Inpatient Remains inpatient appropriate because: further work up for encephalopathy.            Consultants:  Gastroenterology.   Procedures: MRI liver.   Antimicrobials:  Antibiotics Given (last 72 hours)     Date/Time Action Medication Dose Rate   04/25/21 1159 New Bag/Given   cefTRIAXone (ROCEPHIN) 1 g in sodium chloride 0.9 % 100 mL IVPB 1 g 200 mL/hr   04/26/21 1639 New Bag/Given   cefTRIAXone (ROCEPHIN) 1 g in sodium chloride 0.9 % 100 mL IVPB 1 g 200 mL/hr   04/27/21 1150 New Bag/Given   cefTRIAXone (ROCEPHIN) 1 g in sodium chloride 0.9 % 100 mL IVPB 1 g 200 mL/hr        Subjective: Appears comfortable, no new complaints.  Objective: Vitals:   04/27/21 0000 04/27/21 0400 04/27/21 0825 04/27/21 1229  BP: (!) 92/59 101/71 104/76 131/77  Pulse: 96  99 (!) 101  Resp: 17 16 14 15   Temp: 98.1 F (36.7 C) 97.8 F (36.6 C) 97.8 F (36.6 C) 98 F (36.7 C)  TempSrc: Oral Axillary Oral Oral  SpO2: 100% 100% 100% 100%  Weight:      Height:        Intake/Output Summary (Last 24 hours) at 04/27/2021 1358 Last data filed at 04/27/2021 0400 Gross per 24 hour  Intake 100 ml  Output 200 ml  Net -100 ml    Filed Weights   04/22/21  1410  Weight: 82.3 kg    Examination:  General exam: ill appearing lady, lethargic, has anasarca.  not in distress.  Respiratory system: Clear to auscultation. Respiratory effort normal. Cardiovascular system: S1 & S2 heard, RRR. No JVD, pedal edema.  Gastrointestinal system: Abdomen is soft, distended,non tender bs+ Central nervous system: lethargic.   Generalized weakness.  Extremities: pedal edema.  Skin: pressure injury on the back.  Psychiatry: Mood & affect appropriate.     Data Reviewed: I have personally reviewed following labs and imaging studies  CBC: Recent Labs  Lab 04/22/21 1509 04/23/21 0220 04/24/21 0433 04/25/21 0550 04/26/21 0212 04/27/21 0438  WBC 15.5* 10.2 10.4 10.5 12.1* 11.0*  NEUTROABS 12.3*  --  7.1 6.8 8.3* 7.7  HGB 6.3* 9.0* 7.3* 8.0* 7.5* 7.2*  HCT 17.9* 25.9* 20.6* 21.7* 20.8* 20.7*  MCV 79.6* 80.7 79.8* 79.8* 80.6 81.8  PLT 137* 100* 106* 95* 93* 98*     Basic Metabolic Panel: Recent Labs  Lab 04/23/21 0220 04/23/21 1250 04/24/21 0433 04/24/21 0823 04/25/21 0550 04/26/21 0212 04/27/21 0438  NA 133* 133* 131*  --  133* 132* 134*  K 2.9* 2.8* 3.5  --  3.1* 3.3* 3.8  CL 99 98 98  --  99  101 104  CO2 19* 21* 23  --  23 20* 21*  GLUCOSE 82 76 94  --  84 95 102*  BUN 15 14 12   --  11 12 13   CREATININE 0.80 0.90 0.85  --  0.76 0.75 0.74  CALCIUM 7.1* 7.2* 7.9*  --  7.9* 7.7* 8.0*  MG 1.9  --   --  2.4  --   --   --   PHOS 2.5  --   --   --   --   --   --      GFR: Estimated Creatinine Clearance: 77.3 mL/min (by C-G formula based on SCr of 0.74 mg/dL).  Liver Function Tests: Recent Labs  Lab 04/23/21 0220 04/24/21 0433 04/25/21 0550 04/26/21 0212 04/27/21 0438  AST 40 32 45* 49* 52*  ALT 11 9 14 14 16   ALKPHOS 96 85 97 103 109  BILITOT 8.6* 7.2* 6.3* 6.5* 6.7*  PROT 4.7* 4.8* 4.8* 4.8* 4.9*  ALBUMIN 1.9* 2.5* 2.4* 2.3* 2.3*     CBG: Recent Labs  Lab 04/23/21 0340 04/23/21 0817 04/23/21 1208 04/23/21 1727  04/24/21 1946  GLUCAP 78 73 70 100* 91      Recent Results (from the past 240 hour(s))  Urine Culture     Status: Abnormal   Collection Time: 04/22/21  2:13 PM   Specimen: Urine, Catheterized  Result Value Ref Range Status   Specimen Description URINE, CATHETERIZED  Final   Special Requests   Final    NONE Performed at [facility], [place] [address], [place], [place] [address]    Culture >=100,000 COLONIES/mL YEAST (A)  Final   Report Status 04/24/2021 FINAL  Final  Resp Panel by RT-PCR (Flu A&B, Covid) Nasopharyngeal Swab     Status: None   Collection Time: 04/22/21  7:36 PM   Specimen: Nasopharyngeal Swab; Nasopharyngeal(NP) swabs in vial transport medium  Result Value Ref Range Status   SARS Coronavirus 2 by RT PCR NEGATIVE NEGATIVE Final    Comment: (NOTE) SARS-CoV-2 target nucleic acids are NOT DETECTED.  The SARS-CoV-2 RNA is generally detectable in upper respiratory specimens during the acute phase of infection. The lowest concentration of SARS-CoV-2 viral copies this assay can detect is 138 copies/mL. A negative result does not preclude SARS-Cov-2 infection and should not be used as the sole basis for treatment or other patient management decisions. A negative result may occur with  improper specimen collection/handling, submission of specimen other than nasopharyngeal swab, presence of viral mutation(s) within the areas targeted by this assay, and inadequate number of viral copies(<138 copies/mL). A negative result must be combined with clinical observations, patient history, and epidemiological information. The expected result is Negative.  Fact Sheet for Patients:  EntrepreneurPulse.com.au  Fact Sheet for Healthcare Providers:  IncredibleEmployment.be  This test is no t yet approved or cleared by the [place] FDA and  has been authorized for detection and/or diagnosis of SARS-CoV-2 by FDA under an Emergency Use  Authorization (EUA). This EUA will remain  in effect (meaning this test can be used) for the duration of the COVID-19 declaration under Section 564(b)(1) of the Act, 21 U.S.C.section 360bbb-3(b)(1), unless the authorization is terminated  or revoked sooner.       Influenza A by PCR NEGATIVE NEGATIVE Final   Influenza B by PCR NEGATIVE NEGATIVE Final    Comment: (NOTE) The Xpert Xpress SARS-CoV-2/FLU/RSV plus assay is intended as an aid in the diagnosis of influenza from Nasopharyngeal swab  specimens and should not be used as a sole basis for treatment. Nasal washings and aspirates are unacceptable for Xpert Xpress SARS-CoV-2/FLU/RSV testing.  Fact Sheet for Patients: EntrepreneurPulse.com.au  Fact Sheet for Healthcare Providers: IncredibleEmployment.be  This test is not yet approved or cleared by the [place] FDA and has been authorized for detection and/or diagnosis of SARS-CoV-2 by FDA under an Emergency Use Authorization (EUA). This EUA will remain in effect (meaning this test can be used) for the duration of the COVID-19 declaration under Section 564(b)(1) of the Act, 21 U.S.C. section 360bbb-3(b)(1), unless the authorization is terminated or revoked.  Performed at [facility], [address] [place], [place] [address]   MRSA Next Gen by PCR, Nasal     Status: None   Collection Time: 04/22/21  9:38 PM   Specimen: Nasal Mucosa; Nasal Swab  Result Value Ref Range Status   MRSA by PCR Next Gen NOT DETECTED NOT DETECTED Final    Comment: (NOTE) The GeneXpert MRSA Assay (FDA approved for NASAL specimens only), is one component of a comprehensive MRSA colonization surveillance program. It is not intended to diagnose MRSA infection nor to guide or monitor treatment for MRSA infections. Test performance is not FDA approved in patients less than 55 years old. Performed at [facility], [place] [address], [place], [place] [address]    Culture, blood (routine x 2)     Status: None (Preliminary result)   Collection Time: 04/23/21 12:11 PM   Specimen: BLOOD  Result Value Ref Range Status   Specimen Description BLOOD SITE NOT SPECIFIED  Final   Special Requests   Final    BOTTLES DRAWN AEROBIC AND ANAEROBIC Blood Culture adequate volume   Culture   Final    NO GROWTH 4 DAYS Performed at [facility], [place] [address] [place], [place] [address]    Report Status PENDING  Incomplete  Culture, blood (routine x 2)     Status: None (Preliminary result)   Collection Time: 04/23/21 12:35 PM   Specimen: BLOOD  Result Value Ref Range Status   Specimen Description BLOOD SITE NOT SPECIFIED  Final   Special Requests   Final    BOTTLES DRAWN AEROBIC AND ANAEROBIC Blood Culture adequate volume   Culture   Final    NO GROWTH 4 DAYS Performed at [facility], [address], [place], [place] [address]    Report Status PENDING  Incomplete  Peritoneal fluid culture w Gram Stain     Status: None   Collection Time: 04/23/21  4:49 PM   Specimen: Peritoneal Washings; Peritoneal Fluid  Result Value Ref Range Status   Specimen Description PERITONEAL  Final   Special Requests NONE  Final   Gram Stain   Final    FEW WBC PRESENT, PREDOMINANTLY MONONUCLEAR NO ORGANISMS SEEN    Culture   Final    NO GROWTH 3 DAYS Performed at [facility], [address], [place],  [address]    Report Status 04/26/2021 FINAL  Final          Radiology Studies: MR LIVER W WO CONTRAST  Result Date: 04/25/2021 CLINICAL DATA:  Cirrhosis with liver lesions on prior CT scan. EXAM: MRI ABDOMEN WITHOUT AND WITH CONTRAST TECHNIQUE: Multiplanar multisequence MR imaging of the abdomen was performed both before and after the administration of intravenous contrast. CONTRAST:  8.79m GADAVIST GADOBUTROL 1 MMOL/ML IV SOLN COMPARISON:  CT scan 12/12/2020 FINDINGS: Examination is limited by body  habitus and breathing motion artifact. Lower  chest: The lung bases are clear of an acute process. No pleural or pericardial effusion. Hepatobiliary: Heterogeneous fatty infiltration of the liver is demonstrated. There are also cirrhotic changes which appear stable. No intrahepatic biliary dilatation. Gallbladder is surgically absent. On the early arterial phase enhancement sequence there are enhancing hepatic lesions. These are ill-defined and difficult to accurately characterize. 16 mm cortical or subcortical lesion and segment 4 B is faintly present on the portal venous phase sequence but I do not see it definitely after that. 11 mm lesion and segment 3 anteriorly is persistent on delayed sequences without definite washout. This also demonstrates slight increased T2 signal intensity and potentially could be a may angioma. 2 cm lesion and segment 3 laterally is best seen on the delayed sequence with there appears to be subcapsular enhancement. This is the most worrisome for a hepatoma but is indeterminate. Nonenhancing lesion and segment 6 is likely a cyst. Pancreas:  No mass, inflammation or ductal dilatation. Spleen:  Within normal limits in size.  No splenic lesions. Adrenals/Urinary Tract: The adrenal glands demonstrate small bilateral nodules which show loss of signal intensity on the out of phase gradient echo sequence and are likely benign adenomas. No change since 2019. No renal lesions or hydronephrosis. Stomach/Bowel: The stomach, duodenum, small bowel and colon grossly normal. Vascular/Lymphatic: The aorta and branch vessels are patent. The major venous structures are patent. No adenopathy. Other: Moderate volume abdominal ascites and diffuse body wall edema. Musculoskeletal: No significant bony findings. IMPRESSION: 1. Limited examination due to body habitus and breathing motion artifact. 2. Heterogeneous fatty infiltration of the liver and cirrhotic changes. 3. Hepatic lesions are difficult to accurately characterize and to definitively use  LI-RADS criteria but suspicious for hepatomas. Recommend close observation. 4. Bilateral adrenal gland adenomas. 5. Moderate volume abdominal ascites and diffuse body wall edema. Electronically Signed   By: Marijo Sanes M.D.   On: 04/25/2021 19:32        Scheduled Meds:  lactulose  30 g Oral BID   mouth rinse  15 mL Mouth Rinse BID   midodrine  15 mg Oral TID WC   multivitamin with minerals  1 tablet Oral Daily   OLANZapine  5 mg Oral BID   pantoprazole  40 mg Oral QHS   [START ON 04/28/2021] sertraline  25 mg Oral QPC breakfast   Continuous Infusions:  sodium chloride Stopped (04/23/21 0504)   cefTRIAXone (ROCEPHIN)  IV 1 g (04/27/21 1150)     LOS: 5 days        Hosie Poisson, MD [facility]   To contact the attending provider between 7A-7P or the covering provider during after hours 7P-7A, please log into the web site [email or web address] and access using universal [place] password for that web site. If you do not have the password, please call the hospital operator.  04/27/2021, 1:58 PM

## 2021-04-27 NOTE — Consult Note (Signed)
Palliative Medicine Inpatient Consult Note  Consulting Provider: Hosie Poisson, MD  Reason for consult:   [facility] Consult  Reason for Consult? goals of care,   As per caregiver, pt does not take meds at home, she is refusing tests, worsening clinically over the last 1 year , loosing weight, . Needs detailed goc   HPI:  Per intake H&P --> 56 year old with prior history of coronary artery disease, STEMI in 2019, celiac disease, Sharon Lawrence, GI bleed, chronic pain syndrome, liver cirrhosis who presented with generalized weakness, nausea, vomiting and abdominal pain, admitted with undifferentiated shock septic/hypovolemic. Patient came off of vasopressors, SBP was ruled out with negative peritoneal fluid studies. Urine cultures show yeast.   Palliative care has been asked to get involved in the setting of noncompliance to further address goals of care.  Clinical Assessment/Goals of Care:  *Please note that this is a verbal dictation therefore any spelling or grammatical errors are due to the "[place] One" system interpretation.  I have reviewed medical records including EPIC notes, labs and imaging, received report from bedside RN, assessed the patient who would answer questions appropriately though is incrementally drowsy during our conversation.    I met with Ishia Vanhook and called her sister, Kariann Wecker to further discuss diagnosis prognosis, GOC, EOL wishes, disposition and options.   I introduced Palliative Medicine as specialized medical care for people living with serious illness. It focuses on providing relief from the symptoms and stress of a serious illness. The goal is to improve quality of life for both the patient and the family.  Medical History Review and Understanding:  Sharon Lawrence shares with me that her sister has suffered from depression since she was in the eighth grade.  She also has a history of bipolar disorder though she has known  noncompliance with medication therapies.  We reviewed her history of celiac disease which had been diagnosed within the last 13 years though patient has been wildly noncompliant with a no gluten diet.  Discussed her history of liver cirrhosis as well as chronic pain.  Holly and I reviewed Omaya's recurrent rehospitalization's over the past year and how overwhelming this has been for both of them.  Sharon Lawrence shares that her sister is very difficult in terms of communication and compliance with medications or treatment plans prescribed by her medical team.  Social History:  Sharon Lawrence is from [place], [place].  Her husband passed away about 20 years ago.  She has 3 children of whom were predominantly raised by her mother and her Sister Sharon Lawrence.  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   Functional and Nutritional State:  Tiphany has progressively gotten weaker over the last year to the point where her Sister Sharon Lawrence needs to help her with B ADLs such as bathing dressing.  Patient can mobilize though does sometimes need help with transfers.  Hania has lost weight as of recently as she used to be greater than 300 pounds.  Palliative Symptoms:  Depression - Patient has had a longstanding history of depression since the age of 67 though she has not been consistent in treatment.  Patient's Sister Sharon Lawrence shares that Sharon Lawrence stopped caring about things such as the cleanliness of her house, the care of her children, or anything other than working when her husband died 77 years ago.  Sharon Lawrence shares that her mother  passed away in June which sent a me into a deep dark place where she is remained for the past 8 months.  Advance Directives: A detailed discussion was had today regarding advanced directives.  Patient shares she would want her Sister Sharon Lawrence to be her surrogate Media planner.  She thinks she has completed  advanced directives though it does not appear that [facility] can locate them therefore we discussed doing a new version.  Code Status: Encouraged patient/family to consider DNR/DNI status understanding evidenced based poor outcomes in similar hospitalized patient, as the cause of arrest is likely associated with advanced chronic/terminal illness rather than an easily reversible acute cardio-pulmonary event. I explained that DNR/DNI does not change the medical plan and it only comes into effect after a person has arrested (died).  It is a protective measure to keep [place] from harming the patient in their last moments of life.Merrillyn was agreeable to DNR/DNI with understanding that patient would not receive CPR, defibrillation, ACLS medications, or intubation.   Discussion:  Holly and I reviewed the significance of Wilhemenia's acute on chronic conditions.  We discussed that unfortunately if Neomi does not comply with medical treatment she will continue to be rehospitalized and her time on earth will likely be quite short.  Reviewed the importance of this conversation because I worry that overall Ammie will continue to deteriorate whereby she will no longer be able to care for herself and she will be at a point where we will need to elicit the help of hospice care.  Discussed the importance of continued conversation with family and their  medical providers regarding overall plan of care and treatment options, ensuring decisions are within the context of the patients values and GOCs. __________________________________________ Addendum:  I met with Lauri and Holly this afternoon.  Sharon Lawrence shared with me a variety of concerns pertaining to Shekinah's 5-day hospital stay and the care that had been administered.  Sharon Lawrence feels that Yicel has gotten quite sedated in the setting of having received lorazepam for imaging studies and yesterday she seemed completely sedated though today she seems to be coming out of it to some degree.  Sharon Lawrence does  feel that Ketra overall is not in the same state that she had been prior to admission.  We reviewed reasons for that inclusive of ICU associated delirium, sepsis, polypharmacy, chronic disease processes such as priorly identified cirrhosis.  Reviewed the concerns overall regarding Alane's poor health state and that if she continues to have I declined there may not be much more that the medical services can offer.  I shared that in the absence of a me having insurance we are between a rock and a hard place in terms of where she can discharge to.  Right now she would need to go home with [facility] in a much more weakened state as compared to the day of admission.  Plan for the time being is to see how Crickett responds to treatment of her depression.  There are plans for Takelia to get a blood transfusion today we will see if that improves her energy level.  Holly and I were able to get a me to the bedside chair which I encouraged her to stay up and as long as she can tolerate it.  Ongoing support into the following days though if patient should continue to worsen further conversations circulating around the topic of hospice would be had.  Decision Maker: Holls Quade (sister) [phone]  SUMMARY OF RECOMMENDATIONS   DNAR/DNI  A gold DNR all will be placed on the chart  Appreciate psychiatry involvement to help with patient's depression treatment  Gently broached the topic of hospice should Anaissa's condition continue to worsen  Appreciate chaplain helping with advanced directives  Continue current plan of care to see if patient can improve  Ongoing palliative care support  Code Status/Advance Care Planning: DNAR/DNI   Palliative Prophylaxis:  Aspiration, Bowel Regimen, Delirium Protocol, Frequent Pain Assessment, Oral Care, Palliative Wound Care, and Turn Reposition  Additional Recommendations (Limitations, Scope, Preferences): Treat what is treatable  Psycho-social/Spiritual:  Desire for further  Chaplaincy support: No Additional Recommendations: Education on acute conditions inclusive of sepsis, muscular deconditioning   Prognosis: Very worrisome prognosis overall in the setting of recurrent rehospitalization's, noncompliance, increased frailty.  I would imagine this patient has a very high 69-monthmortality risk.  Discharge Planning: Discharge plan unclear as patient's sister not sure if she will be able to continue caring for Belvie if she does not start to help herself.  Vitals:   04/27/21 0000 04/27/21 0400  BP: (!) 92/59 101/71  Pulse: 96   Resp: 17 16  Temp: 98.1 F (36.7 C) 97.8 F (36.6 C)  SpO2: 100% 100%    Intake/Output Summary (Last 24 hours) at 04/27/2021 0706 Last data filed at 04/27/2021 0400 Gross per 24 hour  Intake 100 ml  Output 200 ml  Net -100 ml   Last Weight  Most recent update: 04/22/2021  2:11 PM    Weight  82.3 kg (181 lb 7 oz)            Gen: [id number] female in no acute distress HEENT: Dry mucous membranes CV: Irregular rate and regular rhythm  PULM: On room air breathing is even and nonlabored ABD: soft/nontender  EXT: No edema  Neuro: Alert and oriented x3 -incrementally drowsy  PPS: 40-50%   This conversation/these recommendations were discussed with patient primary care team, Dr. AKarleen Hampshire Total Time: 135  MDM High  Medical Decision Making:4 #/Complex Problems:4                      Data Reviewed:4                 Management:4 (1-Straightforward, 2-Low, 3-Moderate, 4-High) ______________________________________________________ [facility] Team Cell Phone: [phone] utilize secure chat with additional questions, if there is no response within 30 minutes please call the above phone number  Palliative Medicine Team providers are available by phone from 7am to 7pm daily and can be reached through the team cell phone.  Should this patient require assistance outside  of these hours, please call the patient's attending physician.

## 2021-04-28 LAB — CBC WITH DIFFERENTIAL/PLATELET
Abs Immature Granulocytes: 0.18 10*3/uL — ABNORMAL HIGH (ref 0.00–0.07)
Basophils Absolute: 0 10*3/uL (ref 0.0–0.1)
Basophils Relative: 0 %
Eosinophils Absolute: 0 10*3/uL (ref 0.0–0.5)
Eosinophils Relative: 0 %
HCT: 25.8 % — ABNORMAL LOW (ref 36.0–46.0)
Hemoglobin: 9.2 g/dL — ABNORMAL LOW (ref 12.0–15.0)
Immature Granulocytes: 1 %
Lymphocytes Relative: 24 %
Lymphs Abs: 3.4 10*3/uL (ref 0.7–4.0)
MCH: 28.5 pg (ref 26.0–34.0)
MCHC: 35.7 g/dL (ref 30.0–36.0)
MCV: 79.9 fL — ABNORMAL LOW (ref 80.0–100.0)
Monocytes Absolute: 0.6 10*3/uL (ref 0.1–1.0)
Monocytes Relative: 4 %
Neutro Abs: 9.8 10*3/uL — ABNORMAL HIGH (ref 1.7–7.7)
Neutrophils Relative %: 71 %
Platelets: 97 10*3/uL — ABNORMAL LOW (ref 150–400)
RBC: 3.23 MIL/uL — ABNORMAL LOW (ref 3.87–5.11)
RDW: 16.4 % — ABNORMAL HIGH (ref 11.5–15.5)
WBC: 14 10*3/uL — ABNORMAL HIGH (ref 4.0–10.5)
nRBC: 0 % (ref 0.0–0.2)

## 2021-04-28 LAB — CULTURE, BLOOD (ROUTINE X 2)
Culture: NO GROWTH
Culture: NO GROWTH
Special Requests: ADEQUATE
Special Requests: ADEQUATE

## 2021-04-28 LAB — COMPREHENSIVE METABOLIC PANEL
ALT: 16 U/L (ref 0–44)
AST: 48 U/L — ABNORMAL HIGH (ref 15–41)
Albumin: 2.4 g/dL — ABNORMAL LOW (ref 3.5–5.0)
Alkaline Phosphatase: 116 U/L (ref 38–126)
Anion gap: 13 (ref 5–15)
BUN: 16 mg/dL (ref 6–20)
CO2: 18 mmol/L — ABNORMAL LOW (ref 22–32)
Calcium: 8.2 mg/dL — ABNORMAL LOW (ref 8.9–10.3)
Chloride: 105 mmol/L (ref 98–111)
Creatinine, Ser: 0.83 mg/dL (ref 0.44–1.00)
GFR, Estimated: >60 mL/min (ref >60)
Glucose, Bld: 111 mg/dL — ABNORMAL HIGH (ref 70–99)
Potassium: 3.9 mmol/L (ref 3.5–5.1)
Sodium: 136 mmol/L (ref 135–145)
Total Bilirubin: 8.6 mg/dL — ABNORMAL HIGH (ref 0.3–1.2)
Total Protein: 5.1 g/dL — ABNORMAL LOW (ref 6.5–8.1)

## 2021-04-28 LAB — BPAM RBC
Blood Product Expiration Date: 202303042359
ISSUE DATE / TIME: 202302241641
Unit Type and Rh: 6200

## 2021-04-28 LAB — TYPE AND SCREEN
ABO/RH(D): A POS
Antibody Screen: NEGATIVE
Unit division: 0

## 2021-04-28 NOTE — Progress Notes (Addendum)
Palliative Medicine Inpatient Follow Up Note  HPI: 56 year old with prior history of coronary artery disease, STEMI in 2019, celiac disease, Sharon Lawrence, GI bleed, chronic pain syndrome, liver cirrhosis who presented with generalized weakness, nausea, vomiting and abdominal pain, admitted with undifferentiated shock septic/hypovolemic. Patient came off of vasopressors, SBP was ruled out with negative peritoneal fluid studies. Urine cultures show yeast.    Palliative care has been asked to get involved in the setting of noncompliance to further address goals of care.  Today's Discussion (04/28/2021):  *Please note that this is a verbal dictation therefore any spelling or grammatical errors are due to the "[place] One" system interpretation.  Chart reviewed inclusive of vital signs, progress notes, laboratory results, and diagnostic images.   I met with Sharon Lawrence at bedside this morning.  She was far more alert and oriented than she had been yesterday.  She expresses acknowledgment in terms of how sleepy she was feeling.  We reviewed that it is likely in the setting of her receiving Ativan 2 days ago and hopefully as the days proceed the effects will continue to wear off.  Reviewed the importance of nutrition and mobility.  I asked Sharon Lawrence if she be willing to get up this morning which she declined though she did state later in the day she may be more amenable.  I help set up Sharon Lawrence's breakfast and got her some additional tea.  Created space and opportunity for patient to explore thoughts feelings and fears regarding current medical situation. Patient shares acknowledgment of her current medical conditions.  Questions and concerns addressed   Palliative Support Provided  ________________________________ Addendum:  I met with Sharon Lawrence in the hall who shares that Sharon Lawrence is medically optimized for discharge.  We reviewed the limited resources in the setting of Sharon Lawrence's lack of insurance.  Sharon Lawrence plans to  speak further with the transitions of care team to see what additional care can be provided.   ________________________________ Addendum Sharon Lawrence by the room this late morning, helped get Sharon Lawrence OOB to chair. She was unsteady, has a great fear of falling.  Cleaned up and linens made with the nursing staff. Patient endorses feeling better sitting up.  Objective Assessment: Vital Signs Vitals:   04/28/21 0344 04/28/21 0816  BP: 110/69 94/79  Pulse: 96 93  Resp: 17 19  Temp: 98.3 F (36.8 C) 97.7 F (36.5 C)  SpO2: 100% 100%    Intake/Output Summary (Last 24 hours) at 04/28/2021 1114 Last data filed at 04/27/2021 2000 Gross per 24 hour  Intake 718.83 ml  Output --  Net 718.83 ml   Last Weight  Most recent update: 04/22/2021  2:11 PM    Weight  82.3 kg (181 lb 7 oz)            Gen: 89 Caucasian female in no acute distress HEENT: Dry mucous membranes CV: Irregular rate and regular rhythm  PULM: On room air breathing is even and nonlabored ABD: soft/nontender  EXT: No edema  Neuro: Alert and oriented x3 conversant this morning  SUMMARY OF RECOMMENDATIONS   DNAR/DNI   Appreciate psychiatry involvement to help with patient's depression treatment --? Started on zoloft   Gently broached the topic of hospice should Sharon Lawrence's condition continue to worsen   Appreciate chaplain helping with advanced directives   Appreciate transitions of care team aiding to see what additional support can be provided in the oncoming weeks until patient obtains insurance   Ongoing palliative care support  MDM - High ______________________________________________________________________________________ [place] Team Team Cell Phone: [phone] Please utilize secure chat with additional questions, if there is no response within 30 minutes please call the above phone number  Palliative Medicine Team providers are available by phone from 7am to  7pm daily and can be reached through the team cell phone.  Should this patient require assistance outside of these hours, please call the patient's attending physician.

## 2021-04-28 NOTE — Progress Notes (Signed)
PROGRESS NOTE    Sharon Lawrence  HQR:[id number] DOB: [birth year] DOA: 04/22/2021 PCP: Lindell Spar, MD   Chief Complaint  Patient presents with   Weakness    Brief Narrative:  56 year old with prior history of coronary artery disease, STEMI in 2019, celiac disease, [name], GI bleed, chronic pain syndrome, liver cirrhosis who presented with generalized weakness, nausea, vomiting and abdominal pain, admitted with undifferentiated shock septic/hypovolemic. Patient came off of vasopressors, SBP was ruled out with negative peritoneal fluid studies. Urine cultures show yeast. She was given a dose of diflucan in addition to completing the course of rocephin.   Assessment & Plan:   Principal Problem:   Acute sepsis ([place]) Active Problems:   Hypokalemia   Thrombocytopenia (HCC)   Pressure injury of skin   Shock Differential includes sepsis versus hypovolemic shock. Urine cultures growing yeast. One dose of diflucan given.  Peritoneal fluid not consistent with SBP. Patient was weaned off vasopressors. Completed the course of IV Rocephin.   Suspected decompensated cirrhosis with ascites with a history of NAFLD So far work-up is negative for autoimmune hepatitis and viral hepatitis.  Progression of cirrhosis in the last few months. Evidence of portal hypertension is seen. Liver MRI showed Hepatic lesions are difficult to accurately characterize and to definitively use LI-RADS criteria but suspicious for hepatomas. Moderate volume abdominal ascites and diffuse body wall edema. GI recommended liver biopsy, but she refused biopsy, agreed for a repeat MRI in 3 months.  AFP negative, mitochondrial antibody is negative. Alpha antitrypsin is 149.  Lactulose added and target about 2-3 bowel movements per day. Ammonia levels wnL yesterday, no BM today.  Her bilirubin level increasing , today its 8.6. talked to GI apparently not a candidate for steroids since her cirrhosis is secondary to NASH.      Elevated BNP and anasarca probably secondary to hypoalbuminemia secondary to cirrhosis. Echocardiogram shows hyperdynamic left ventricular ejection fraction. Mri liver showed Moderate volume abdominal ascites and diffuse body wall edema. Unable to start on diuretics due to low BP parameters.  Will get US abdomen to see if she needs paracentesis.    Chronic bilateral lower extremity weakness Therapy evaluations ordered. Home health PT/OT. She needs full supervision.  Resume home medications. Outpatient neurology follow-up recommended.   Acute metabolic encephalopathy:  Possibly drug induced from ativan yesterday vs from hepatic encephalopathy. Getting ammonia levels. Lactulose dose increased.  Pt remains lethargic, but answering all questions appropriately.    Acute on chronic microcytic anemia and acute thrombocytopenia in the setting of liver cirrhosis and history of GI bleed in 2019 due to Brilinta S/p 2 units of prbc transfusion , hemoglobin improved to 9.2.  Transfuse to keep hemoglobin greater than 7. Monitor for signs of bleeding. Acute thrombocytopenia probably secondary to decompensated cirrhosis Platelets have stabilized at 97,000.    History of coronary artery disease, STEMI s/p PCI to RCA Patient currently denies any chest pain or shortness of breath.    History of celiac disease Continue with gluten-free diet   Unintentional weight loss Outpatient follow-up with PCP for appropriate cancer screening.   Pressure Injury  present on admission: Pressure Injury 04/22/21 Sacrum Right;Mid Stage 2 -  Partial thickness loss of dermis presenting as a shallow open injury with a red, pink wound bed without slough. Pink wound bed (Active)  04/22/21 2145  Location: Sacrum  Location Orientation: Right;Mid  Staging: Stage 2 -  Partial thickness loss of dermis presenting as a shallow open injury with a red,  pink wound bed without slough.  Wound Description (Comments):  Pink wound bed  Present on Admission: Yes   Wound care will be consulted.    Hyponatremia, hypokalemia  Replaced.   Depression:  Flat affect, as per caregiver,is non compliant to meds and gluten free diet. Does not take care of herself.  Reports being depressed, and has refused to be put on anti depression meds in the past.  This morning she reports feeling depressed, denies suicidal ideation, has agreed to speak to psychiatry and take meds if needed. Psychiatry consulted. Will need to evaluate the patient to see if she has insight into her medical issues.  In view of her recurrent admissions, liver cirrhosis, non compliant to meds, diet, and care, palliative care consulted for goals of care discussions.  Psychiatry evaluated and started her on zoloft. Appreciate their help.     DVT prophylaxis: scd's Code Status: Full code.  Family Communication: none at bedside.  Discussed with sister [name] over the phone.  Disposition:   Status is: Inpatient Remains inpatient appropriate because: unsafe d/c plan.  As per palliative care, Sister [facility] cannot care for [place]. She needed 2 + assistance.            Consultants:  Gastroenterology.  Palliative care Psychiatry.   Procedures: MRI liver.   Antimicrobials:  Antibiotics Given (last 72 hours)     Date/Time Action Medication Dose Rate   04/26/21 1639 New Bag/Given   cefTRIAXone (ROCEPHIN) 1 g in sodium chloride 0.9 % 100 mL IVPB 1 g 200 mL/hr   04/27/21 1150 New Bag/Given   cefTRIAXone (ROCEPHIN) 1 g in sodium chloride 0.9 % 100 mL IVPB 1 g 200 mL/hr   04/28/21 1151 New Bag/Given   cefTRIAXone (ROCEPHIN) 1 g in sodium chloride 0.9 % 100 mL IVPB 1 g 200 mL/hr        Subjective: No chest pain, appear fluid overloaded.   Objective: Vitals:   04/27/21 2359 04/28/21 0344 04/28/21 0816 04/28/21 1215  BP: 110/86 110/69 94/79 113/79  Pulse: 94 96 93 96  Resp: 18 17 19 15   Temp: 98.6 F (37 C) 98.3 F (36.8 C) 97.7 F  (36.5 C) 97.9 F (36.6 C)  TempSrc: Oral Oral Oral Oral  SpO2: 100% 100% 100% 99%  Weight:      Height:        Intake/Output Summary (Last 24 hours) at 04/28/2021 1309 Last data filed at 04/27/2021 2000 Gross per 24 hour  Intake 378.83 ml  Output --  Net 378.83 ml    Filed Weights   04/22/21 1410  Weight: 82.3 kg    Examination:  General exam: Ill appearing Jaundiced lady, appears exhausted. Answering questions appropriately.  Respiratory system: Clear to auscultation. Respiratory effort normal. Cardiovascular system: S1 & S2 heard, RRR. No pedal edema. Gastrointestinal system: Abdomen is soft, distended, non tender. Normal bowel sounds heard. Central nervous system: Alert , answering questions appropriately.  Extremities:  pedal edema.  Skin: bruising of the upper extremities.  Psychiatry: flat affect.      Data Reviewed: I have personally reviewed following labs and imaging studies  CBC: Recent Labs  Lab 04/24/21 0433 04/25/21 0550 04/26/21 0212 04/27/21 0438 04/28/21 0101  WBC 10.4 10.5 12.1* 11.0* 14.0*  NEUTROABS 7.1 6.8 8.3* 7.7 9.8*  HGB 7.3* 8.0* 7.5* 7.2* 9.2*  HCT 20.6* 21.7* 20.8* 20.7* 25.8*  MCV 79.8* 79.8* 80.6 81.8 79.9*  PLT 106* 95* 93* 98* 97*     Basic Metabolic Panel:  Recent Labs  Lab 04/23/21 0220 04/23/21 1250 04/24/21 0433 04/24/21 0823 04/25/21 0550 04/26/21 0212 04/27/21 0438 04/28/21 0101  NA 133*   < > 131*  --  133* 132* 134* 136  K 2.9*   < > 3.5  --  3.1* 3.3* 3.8 3.9  CL 99   < > 98  --  99 101 104 105  CO2 19*   < > 23  --  23 20* 21* 18*  GLUCOSE 82   < > 94  --  84 95 102* 111*  BUN 15   < > 12  --  11 12 13 16   CREATININE 0.80   < > 0.85  --  0.76 0.75 0.74 0.83  CALCIUM 7.1*   < > 7.9*  --  7.9* 7.7* 8.0* 8.2*  MG 1.9  --   --  2.4  --   --   --   --   PHOS 2.5  --   --   --   --   --   --   --    < > = values in this interval not displayed.     GFR: Estimated Creatinine Clearance: 74.5 mL/min (by C-G  formula based on SCr of 0.83 mg/dL).  Liver Function Tests: Recent Labs  Lab 04/24/21 0433 04/25/21 0550 04/26/21 0212 04/27/21 0438 04/28/21 0101  AST 32 45* 49* 52* 48*  ALT 9 14 14 16 16   ALKPHOS 85 97 103 109 116  BILITOT 7.2* 6.3* 6.5* 6.7* 8.6*  PROT 4.8* 4.8* 4.8* 4.9* 5.1*  ALBUMIN 2.5* 2.4* 2.3* 2.3* 2.4*     CBG: Recent Labs  Lab 04/23/21 0340 04/23/21 0817 04/23/21 1208 04/23/21 1727 04/24/21 1946  GLUCAP 78 73 70 100* 91      Recent Results (from the past 240 hour(s))  Urine Culture     Status: Abnormal   Collection Time: 04/22/21  2:13 PM   Specimen: Urine, Catheterized  Result Value Ref Range Status   Specimen Description URINE, CATHETERIZED  Final   Special Requests   Final    NONE Performed at [facility], [place] [address] [place], [place] [address]    Culture >=100,000 COLONIES/mL YEAST (A)  Final   Report Status 04/24/2021 FINAL  Final  Resp Panel by RT-PCR (Flu A&B, Covid) Nasopharyngeal Swab     Status: None   Collection Time: 04/22/21  7:36 PM   Specimen: Nasopharyngeal Swab; Nasopharyngeal(NP) swabs in vial transport medium  Result Value Ref Range Status   SARS Coronavirus 2 by RT PCR NEGATIVE NEGATIVE Final    Comment: (NOTE) SARS-CoV-2 target nucleic acids are NOT DETECTED.  The SARS-CoV-2 RNA is generally detectable in upper respiratory specimens during the acute phase of infection. The lowest concentration of SARS-CoV-2 viral copies this assay can detect is 138 copies/mL. A negative result does not preclude SARS-Cov-2 infection and should not be used as the sole basis for treatment or other patient management decisions. A negative result may occur with  improper specimen collection/handling, submission of specimen other than nasopharyngeal swab, presence of viral mutation(s) within the areas targeted by this assay, and inadequate number of viral copies(<138 copies/mL). A negative result must be combined with clinical  observations, patient history, and epidemiological information. The expected result is Negative.  Fact Sheet for Patients:  EntrepreneurPulse.com.au  Fact Sheet for Healthcare Providers:  IncredibleEmployment.be  This test is no t yet approved or cleared by the [place] FDA and  has  been authorized for detection and/or diagnosis of SARS-CoV-2 by FDA under an Emergency Use Authorization (EUA). This EUA will remain  in effect (meaning this test can be used) for the duration of the COVID-19 declaration under Section 564(b)(1) of the Act, 21 U.S.C.section 360bbb-3(b)(1), unless the authorization is terminated  or revoked sooner.       Influenza A by PCR NEGATIVE NEGATIVE Final   Influenza B by PCR NEGATIVE NEGATIVE Final    Comment: (NOTE) The Xpert Xpress SARS-CoV-2/FLU/RSV plus assay is intended as an aid in the diagnosis of influenza from Nasopharyngeal swab specimens and should not be used as a sole basis for treatment. Nasal washings and aspirates are unacceptable for Xpert Xpress SARS-CoV-2/FLU/RSV testing.  Fact Sheet for Patients: EntrepreneurPulse.com.au  Fact Sheet for Healthcare Providers: IncredibleEmployment.be  This test is not yet approved or cleared by the [place] FDA and has been authorized for detection and/or diagnosis of SARS-CoV-2 by FDA under an Emergency Use Authorization (EUA). This EUA will remain in effect (meaning this test can be used) for the duration of the COVID-19 declaration under Section 564(b)(1) of the Act, 21 U.S.C. section 360bbb-3(b)(1), unless the authorization is terminated or revoked.  Performed at [facility], [address], [place], [place] [address]   MRSA Next Gen by PCR, Nasal     Status: None   Collection Time: 04/22/21  9:38 PM   Specimen: Nasal Mucosa; Nasal Swab  Result Value Ref Range Status   MRSA by PCR Next Gen NOT DETECTED NOT DETECTED  Final    Comment: (NOTE) The GeneXpert MRSA Assay (FDA approved for NASAL specimens only), is one component of a comprehensive MRSA colonization surveillance program. It is not intended to diagnose MRSA infection nor to guide or monitor treatment for MRSA infections. Test performance is not FDA approved in patients less than 32 years old. Performed at [facility], [place] [address], [place], [place] [address]   Culture, blood (routine x 2)     Status: None   Collection Time: 04/23/21 12:11 PM   Specimen: BLOOD  Result Value Ref Range Status   Specimen Description BLOOD SITE NOT SPECIFIED  Final   Special Requests   Final    BOTTLES DRAWN AEROBIC AND ANAEROBIC Blood Culture adequate volume   Culture   Final    NO GROWTH 5 DAYS Performed at [facility], [place] [address] [place], [place] [address]    Report Status 04/28/2021 FINAL  Final  Culture, blood (routine x 2)     Status: None   Collection Time: 04/23/21 12:35 PM   Specimen: BLOOD  Result Value Ref Range Status   Specimen Description BLOOD SITE NOT SPECIFIED  Final   Special Requests   Final    BOTTLES DRAWN AEROBIC AND ANAEROBIC Blood Culture adequate volume   Culture   Final    NO GROWTH 5 DAYS Performed at [facility], [place] [address] [place], [place] [address]    Report Status 04/28/2021 FINAL  Final  Peritoneal fluid culture w Gram Stain     Status: None   Collection Time: 04/23/21  4:49 PM   Specimen: Peritoneal Washings; Peritoneal Fluid  Result Value Ref Range Status   Specimen Description PERITONEAL  Final   Special Requests NONE  Final   Gram Stain   Final    FEW WBC PRESENT, PREDOMINANTLY MONONUCLEAR NO ORGANISMS SEEN    Culture   Final    NO GROWTH 3 DAYS Performed  at [facility], [place] [address] [place], [place] [address]    Report Status 04/26/2021 FINAL  Final          Radiology Studies: No results found.      Scheduled Meds:  lactulose  30 g Oral BID   mouth  rinse  15 mL Mouth Rinse BID   midodrine  15 mg Oral TID WC   multivitamin with minerals  1 tablet Oral Daily   OLANZapine  5 mg Oral BID   pantoprazole  40 mg Oral QHS   sertraline  25 mg Oral QPC breakfast   Continuous Infusions:  sodium chloride Stopped (04/23/21 0504)     LOS: 6 days        Hosie Poisson, MD [facility]   To contact the attending provider between 7A-7P or the covering provider during after hours 7P-7A, please log into the web site [email or web address] and access using universal [place] password for that web site. If you do not have the password, please call the hospital operator.  04/28/2021, 1:09 PM

## 2021-04-29 ENCOUNTER — Inpatient Hospital Stay (HOSPITAL_COMMUNITY): Payer: Self-pay

## 2021-04-29 DIAGNOSIS — Z515 Encounter for palliative care: Secondary | ICD-10-CM

## 2021-04-29 DIAGNOSIS — Z7189 Other specified counseling: Secondary | ICD-10-CM

## 2021-04-29 LAB — CBC WITH DIFFERENTIAL/PLATELET
Abs Immature Granulocytes: 0.13 10*3/uL — ABNORMAL HIGH (ref 0.00–0.07)
Basophils Absolute: 0 10*3/uL (ref 0.0–0.1)
Basophils Relative: 0 %
Eosinophils Absolute: 0 10*3/uL (ref 0.0–0.5)
Eosinophils Relative: 0 %
HCT: 25.6 % — ABNORMAL LOW (ref 36.0–46.0)
Hemoglobin: 9.2 g/dL — ABNORMAL LOW (ref 12.0–15.0)
Immature Granulocytes: 1 %
Lymphocytes Relative: 29 %
Lymphs Abs: 4.1 10*3/uL — ABNORMAL HIGH (ref 0.7–4.0)
MCH: 28.5 pg (ref 26.0–34.0)
MCHC: 35.9 g/dL (ref 30.0–36.0)
MCV: 79.3 fL — ABNORMAL LOW (ref 80.0–100.0)
Monocytes Absolute: 0.6 10*3/uL (ref 0.1–1.0)
Monocytes Relative: 5 %
Neutro Abs: 8.9 10*3/uL — ABNORMAL HIGH (ref 1.7–7.7)
Neutrophils Relative %: 65 %
Platelets: 110 10*3/uL — ABNORMAL LOW (ref 150–400)
RBC: 3.23 MIL/uL — ABNORMAL LOW (ref 3.87–5.11)
RDW: 17 % — ABNORMAL HIGH (ref 11.5–15.5)
WBC: 13.8 10*3/uL — ABNORMAL HIGH (ref 4.0–10.5)
nRBC: 0 % (ref 0.0–0.2)

## 2021-04-29 LAB — COMPREHENSIVE METABOLIC PANEL
ALT: 17 U/L (ref 0–44)
AST: 55 U/L — ABNORMAL HIGH (ref 15–41)
Albumin: 2.5 g/dL — ABNORMAL LOW (ref 3.5–5.0)
Alkaline Phosphatase: 120 U/L (ref 38–126)
Anion gap: 10 (ref 5–15)
BUN: 19 mg/dL (ref 6–20)
CO2: 20 mmol/L — ABNORMAL LOW (ref 22–32)
Calcium: 8.3 mg/dL — ABNORMAL LOW (ref 8.9–10.3)
Chloride: 104 mmol/L (ref 98–111)
Creatinine, Ser: 0.81 mg/dL (ref 0.44–1.00)
GFR, Estimated: >60 mL/min (ref >60)
Glucose, Bld: 119 mg/dL — ABNORMAL HIGH (ref 70–99)
Potassium: 3.7 mmol/L (ref 3.5–5.1)
Sodium: 134 mmol/L — ABNORMAL LOW (ref 135–145)
Total Bilirubin: 6.6 mg/dL — ABNORMAL HIGH (ref 0.3–1.2)
Total Protein: 5.1 g/dL — ABNORMAL LOW (ref 6.5–8.1)

## 2021-04-29 MED ORDER — FUROSEMIDE 20 MG PO TABS
20.0000 mg | ORAL_TABLET | Freq: Every day | ORAL | Status: DC
  Administered 2021-04-29 – 2021-04-30 (×2): 20 mg via ORAL
  Filled 2021-04-29 (×2): qty 1

## 2021-04-29 MED ORDER — LIDOCAINE HCL (PF) 1 % IJ SOLN
INTRAMUSCULAR | Status: AC
  Filled 2021-04-29: qty 30

## 2021-04-29 NOTE — Progress Notes (Addendum)
Palliative Medicine Inpatient Follow Up Note  HPI: 56 year old with prior history of coronary artery disease, STEMI in 2019, celiac disease, Sharon Lawrence, GI bleed, chronic pain syndrome, liver cirrhosis who presented with generalized weakness, nausea, vomiting and abdominal pain, admitted with undifferentiated shock septic/hypovolemic. Patient came off of vasopressors, SBP was ruled out with negative peritoneal fluid studies. Urine cultures show yeast.    Palliative care has been asked to get involved in the setting of noncompliance to further address goals of care.  Today's Discussion (04/29/2021):  *Please note that this is a verbal dictation therefore any spelling or grammatical errors are due to the "[employer] [employer]" system interpretation.  Chart reviewed inclusive of vital signs, progress notes, laboratory results, and diagnostic images.   I met with Sharon Lawrence at bedside this morning. She was alert and oriented. She shares with me that she is feeling overall quite weak today. Reviewed the plan to get additional fluid off of her. Discussed the importance of continued mobility.   I called patients sister, [name] this morning. We had open and candid conversations about patients situation and how there is a lack of support services being that she does not have insurance. I shared that [place] would meet criteria for hospice presently if this was desired. [name] shares that she and her family would like to consider enrolling her in [employer] care and continuing to try to get her both disability/medicaid.  [name] would like services offered to Sharon Lawrence to improve her condition and get her stronger.  ___________________________ Addendum:  I met with [name], [place], and her father this late afternoon. They share frustrations with the system and seek to get more information from Dr. Laural Golden prior to proceeding with additional decisions about things such as Hospice.  [place] for enrollment.  Ongoing  support  Objective Assessment: Vital Signs Vitals:   04/29/21 0600 04/29/21 0749  BP: 124/84 130/77  Pulse: 88 97  Resp: 10 12  Temp:  97.6 F (36.4 C)  SpO2: 96% 98%    Intake/Output Summary (Last 24 hours) at 04/29/2021 0910 Last data filed at 04/28/2021 1600 Gross per 24 hour  Intake 200 ml  Output --  Net 200 ml    Last Weight  Most recent update: 04/22/2021  2:11 PM    Weight  82.3 kg (181 lb 7 oz)            Gen: 49 Caucasian female in no acute distress HEENT: Dry mucous membranes CV: Irregular rate and regular rhythm  PULM: On room air breathing is even and nonlabored ABD: soft/nontender  EXT: Generalized pitting edema, profound anasarca Neuro: Alert and oriented x2-3 conversant this morning  SUMMARY OF RECOMMENDATIONS   DNAR/DNI   Appreciate psychiatry involvement to help with patient's depression treatment --> Started on zoloft   Gently broached the topic of hospice should [name]'s condition continue to worsen   Appreciate chaplain helping with advanced directives   Appreciate transitions of care team aiding to see what additional support can be provided in the oncoming weeks until patient obtains insurance   Ongoing palliative care support  MDM - Moderate  ______________________________________________________________________________________ [place] Team Team Cell Phone: [phone] Please utilize secure chat with additional questions, if there is no response within 30 minutes please call the above phone number  Palliative Medicine Team providers are available by phone from 7am to 7pm daily and can be reached through the team cell phone.  Should this patient require assistance  outside of these hours, please call the patient's attending physician.

## 2021-04-29 NOTE — TOC Progression Note (Signed)
Transition of Care [facility]) - Progression Note    Patient Details  Name: Sharon Lawrence MRN: [id number] Date of Birth: 02/17/1966  Transition of Care [facility] CM/SW Contact  Carles Collet, RN Phone Number: 04/29/2021, 10:52 AM  Clinical Narrative:   Damaris Schooner w patient's sister to discuss discharge needs. She states that she is going to discuss plan with her family and decide if they want home hospice services, she has been discussing [place] w palliative team for a few days now. If they agree to home hospice, they would like to use [facility]. Sister states that she will let Sharyn Lull w PCT know of their decision later today.     Expected Discharge Plan: [place] Barriers to Discharge: Continued Medical Work up  Expected Discharge Plan and Services Expected Discharge Plan: [place]   Discharge Planning Services: CM Consult Post Acute Care Choice: [place] arrangements for the past 2 months: Single Family Home                 DME Arranged: N/A DME Agency: NA       HH Arranged: PT, OT HH Agency: [place] ([name]) Date HH Agency Contacted: 04/25/21 Time [place]: 1530 Representative spoke with at [place]: [place]- Will Administrator, arts for approval.   Social Determinants of Health (SDOH) Interventions    Readmission Risk Interventions No flowsheet data found.

## 2021-04-29 NOTE — Progress Notes (Signed)
PROGRESS NOTE    Sharon Lawrence  HYW:[id number] DOB: [birth year] DOA: 04/22/2021 PCP: Lindell Spar, MD   Chief Complaint  Patient presents with   Weakness    Brief Narrative:  56 year old with prior history of coronary artery disease, STEMI in 2019, celiac disease, Sharon Lawrence, GI bleed, chronic pain syndrome, liver cirrhosis who presented with generalized weakness, nausea, vomiting and abdominal pain, admitted with undifferentiated shock septic/hypovolemic. Patient came off of vasopressors, SBP was ruled out with negative peritoneal fluid studies. Urine cultures show yeast. She was given a dose of diflucan in addition to completing the course of rocephin.   Pt seen and examined, she is alert and answering questions appropriately. Family to have discussions later on and decide about home hospice vs discharge home. Dr Sharon Lawrence with GI, has graciously agreed to talk to the family regarding the prognosis.   Assessment & Plan:   Principal Problem:   Acute sepsis ([place]) Active Problems:   Hypokalemia   Thrombocytopenia ([facility])   Pressure injury of skin   Shock Differential includes sepsis versus hypovolemic shock. Urine cultures growing yeast. One dose of diflucan given.  Peritoneal fluid not consistent with SBP. Patient was weaned off vasopressors. Completed the course of IV Rocephin.   Suspected decompensated cirrhosis with ascites with a history of NAFLD So far work-up is negative for autoimmune hepatitis and viral hepatitis.  Progression of cirrhosis in the last few months. Evidence of portal hypertension is seen. Liver MRI showed Hepatic lesions are difficult to accurately characterize and to definitively use LI-RADS criteria but suspicious for hepatomas. Moderate volume abdominal ascites and diffuse body wall edema. GI recommended liver biopsy, but she refused biopsy, agreed for a repeat MRI in 3 months.  AFP negative, mitochondrial antibody is negative. Alpha antitrypsin is 149.   Lactulose added and target about 2-3 bowel movements per day. Bilirubin has improved to 6.6 today.  Will start her on low dose lasix and see if she can tolerate it.     Elevated BNP and anasarca probably secondary to hypoalbuminemia secondary to cirrhosis. Echocardiogram shows hyperdynamic left ventricular ejection fraction. Mri liver showed Moderate volume abdominal ascites and diffuse body wall edema. Will start her on low dose lasix and monitor BP parameters.   US abdomen for paracentesis ordered, did not show enough fluid for paracentesis.    Chronic bilateral lower extremity weakness Therapy evaluations ordered. Home health PT/OT. She needs full supervision.  Resume home medications. Outpatient neurology follow-up recommended.   Acute metabolic encephalopathy:  Possibly drug induced from ativan yesterday vs from hepatic encephalopathy. Getting ammonia levels. Lactulose dose increased.  Pt alert and answering questions.    Acute on chronic microcytic anemia and acute thrombocytopenia in the setting of liver cirrhosis and history of GI bleed in 2019 due to Brilinta S/p 2 units of prbc transfusion , hemoglobin improved to 9.2.  Transfuse to keep hemoglobin greater than 7. Monitor for signs of bleeding. Acute thrombocytopenia probably secondary to decompensated cirrhosis Platelets have stabilized     History of coronary artery disease, STEMI s/p PCI to RCA Patient currently denies any chest pain or shortness of breath.    History of celiac disease Continue with gluten-free diet   Unintentional weight loss Outpatient follow-up with PCP for appropriate cancer screening.   Pressure Injury  present on admission: Pressure Injury 04/22/21 Sacrum Right;Mid Stage 2 -  Partial thickness loss of dermis presenting as a shallow open injury with a red, pink wound bed without slough. Pink wound bed (  Active)  04/22/21 2145  Location: Sacrum  Location Orientation: Right;Mid   Staging: Stage 2 -  Partial thickness loss of dermis presenting as a shallow open injury with a red, pink wound bed without slough.  Wound Description (Comments): Pink wound bed  Present on Admission: Yes   Wound care will be consulted.    Hyponatremia, hypokalemia  Replaced.   Depression:  Flat affect, as per caregiver,is non compliant to meds and gluten free diet. Does not take care of herself.  Reports being depressed, and has refused to be put on anti depression meds in the past.  This morning she reports feeling depressed, denies suicidal ideation, has agreed to speak to psychiatry and take meds if needed. Psychiatry consulted. Will need to evaluate the patient to see if she has insight into her medical issues.  In view of her recurrent admissions, liver cirrhosis, non compliant to meds, diet, and care, palliative care consulted for goals of care discussions.  Psychiatry evaluated and started her on zoloft. Appreciate their help.    Family to decide regarding disposition.    DVT prophylaxis: scd's Code Status: Full code.  Family Communication: none at bedside.  Discussed with sister Sharon Lawrence over the phone.  Disposition:   Status is: Inpatient Remains inpatient appropriate because: unsafe d/c plan.  As per palliative care, Sister [facility] cannot care for Sharon Lawrence. She needed 2 + assistance.            Consultants:  Gastroenterology.  Palliative care Psychiatry.   Procedures: MRI liver.   Antimicrobials:  Antibiotics Given (last 72 hours)     Date/Time Action Medication Dose Rate   04/26/21 1639 New Bag/Given   cefTRIAXone (ROCEPHIN) 1 g in sodium chloride 0.9 % 100 mL IVPB 1 g 200 mL/hr   04/27/21 1150 New Bag/Given   cefTRIAXone (ROCEPHIN) 1 g in sodium chloride 0.9 % 100 mL IVPB 1 g 200 mL/hr   04/28/21 1151 New Bag/Given   cefTRIAXone (ROCEPHIN) 1 g in sodium chloride 0.9 % 100 mL IVPB 1 g 200 mL/hr        Subjective: No chest pain or sob, no nausea,  vomiting or abd pain.   Objective: Vitals:   04/29/21 0400 04/29/21 0600 04/29/21 0749 04/29/21 1226  BP: 133/77 124/84 130/77 123/83  Pulse: 83 88 97 (!) 103  Resp: (!) 9 10 12 16   Temp:   97.6 F (36.4 C) 97.6 F (36.4 C)  TempSrc:   Oral Oral  SpO2: 96% 96% 98% 95%  Weight:      Height:        Intake/Output Summary (Last 24 hours) at 04/29/2021 1559 Last data filed at 04/28/2021 1600 Gross per 24 hour  Intake 200 ml  Output --  Net 200 ml    Filed Weights   04/22/21 1410  Weight: 82.3 kg    Examination:  General exam: ill appearing lady , not in distress. Jaundiced.  Respiratory system: Clear to auscultation. Respiratory effort normal. Cardiovascular system: S1 & S2 heard, RRR. No JVD, murmurs, fluid overloaded.  Gastrointestinal system: Abdomen is nondistended, soft and nontender. Normal bowel sounds heard. Central nervous system: Alert and oriented. Place and person only. Slow to react. No focal deficits.  Extremities: fluid overloaded, leg and arm edema.  Skin: bruising over the upper extremities.  Psychiatry: continues to have flat affect.       Data Reviewed: I have personally reviewed following labs and imaging studies  CBC: Recent Labs  Lab 04/25/21 0550 04/26/21  2122 04/27/21 0438 04/28/21 0101 04/29/21 0242  WBC 10.5 12.1* 11.0* 14.0* 13.8*  NEUTROABS 6.8 8.3* 7.7 9.8* 8.9*  HGB 8.0* 7.5* 7.2* 9.2* 9.2*  HCT 21.7* 20.8* 20.7* 25.8* 25.6*  MCV 79.8* 80.6 81.8 79.9* 79.3*  PLT 95* 93* 98* 97* 110*     Basic Metabolic Panel: Recent Labs  Lab 04/23/21 0220 04/23/21 1250 04/24/21 0823 04/25/21 0550 04/26/21 0212 04/27/21 0438 04/28/21 0101 04/29/21 0242  NA 133*   < >  --  133* 132* 134* 136 134*  K 2.9*   < >  --  3.1* 3.3* 3.8 3.9 3.7  CL 99   < >  --  99 101 104 105 104  CO2 19*   < >  --  23 20* 21* 18* 20*  GLUCOSE 82   < >  --  84 95 102* 111* 119*  BUN 15   < >  --  11 12 13 16 19   CREATININE 0.80   < >  --  0.76 0.75 0.74  0.83 0.81  CALCIUM 7.1*   < >  --  7.9* 7.7* 8.0* 8.2* 8.3*  MG 1.9  --  2.4  --   --   --   --   --   PHOS 2.5  --   --   --   --   --   --   --    < > = values in this interval not displayed.     GFR: Estimated Creatinine Clearance: 76.3 mL/min (by C-G formula based on SCr of 0.81 mg/dL).  Liver Function Tests: Recent Labs  Lab 04/25/21 0550 04/26/21 0212 04/27/21 0438 04/28/21 0101 04/29/21 0242  AST 45* 49* 52* 48* 55*  ALT 14 14 16 16 17   ALKPHOS 97 103 109 116 120  BILITOT 6.3* 6.5* 6.7* 8.6* 6.6*  PROT 4.8* 4.8* 4.9* 5.1* 5.1*  ALBUMIN 2.4* 2.3* 2.3* 2.4* 2.5*     CBG: Recent Labs  Lab 04/23/21 0340 04/23/21 0817 04/23/21 1208 04/23/21 1727 04/24/21 1946  GLUCAP 78 73 70 100* 91      Recent Results (from the past 240 hour(s))  Urine Culture     Status: Abnormal   Collection Time: 04/22/21  2:13 PM   Specimen: Urine, Catheterized  Result Value Ref Range Status   Specimen Description URINE, CATHETERIZED  Final   Special Requests   Final    NONE Performed at [facility], [place] [address], [place], [place] [address]    Culture >=100,000 COLONIES/mL YEAST (A)  Final   Report Status 04/24/2021 FINAL  Final  Resp Panel by RT-PCR (Flu A&B, Covid) Nasopharyngeal Swab     Status: None   Collection Time: 04/22/21  7:36 PM   Specimen: Nasopharyngeal Swab; Nasopharyngeal(NP) swabs in vial transport medium  Result Value Ref Range Status   SARS Coronavirus 2 by RT PCR NEGATIVE NEGATIVE Final    Comment: (NOTE) SARS-CoV-2 target nucleic acids are NOT DETECTED.  The SARS-CoV-2 RNA is generally detectable in upper respiratory specimens during the acute phase of infection. The lowest concentration of SARS-CoV-2 viral copies this assay can detect is 138 copies/mL. A negative result does not preclude SARS-Cov-2 infection and should not be used as the sole basis for treatment or other patient management decisions. A negative result may occur with  improper  specimen collection/handling, submission of specimen other than nasopharyngeal swab, presence of viral mutation(s) within the areas targeted by this assay, and inadequate number  of viral copies(<138 copies/mL). A negative result must be combined with clinical observations, patient history, and epidemiological information. The expected result is Negative.  Fact Sheet for Patients:  EntrepreneurPulse.com.au  Fact Sheet for Healthcare Providers:  IncredibleEmployment.be  This test is no t yet approved or cleared by the [place] FDA and  has been authorized for detection and/or diagnosis of SARS-CoV-2 by FDA under an Emergency Use Authorization (EUA). This EUA will remain  in effect (meaning this test can be used) for the duration of the COVID-19 declaration under Section 564(b)(1) of the Act, 21 U.S.C.section 360bbb-3(b)(1), unless the authorization is terminated  or revoked sooner.       Influenza A by PCR NEGATIVE NEGATIVE Final   Influenza B by PCR NEGATIVE NEGATIVE Final    Comment: (NOTE) The Xpert Xpress SARS-CoV-2/FLU/RSV plus assay is intended as an aid in the diagnosis of influenza from Nasopharyngeal swab specimens and should not be used as a sole basis for treatment. Nasal washings and aspirates are unacceptable for Xpert Xpress SARS-CoV-2/FLU/RSV testing.  Fact Sheet for Patients: EntrepreneurPulse.com.au  Fact Sheet for Healthcare Providers: IncredibleEmployment.be  This test is not yet approved or cleared by the [place] FDA and has been authorized for detection and/or diagnosis of SARS-CoV-2 by FDA under an Emergency Use Authorization (EUA). This EUA will remain in effect (meaning this test can be used) for the duration of the COVID-19 declaration under Section 564(b)(1) of the Act, 21 U.S.C. section 360bbb-3(b)(1), unless the authorization is terminated or revoked.  Performed at  [facility], [address], [place], [place] [address]   MRSA Next Gen by PCR, Nasal     Status: None   Collection Time: 04/22/21  9:38 PM   Specimen: Nasal Mucosa; Nasal Swab  Result Value Ref Range Status   MRSA by PCR Next Gen NOT DETECTED NOT DETECTED Final    Comment: (NOTE) The GeneXpert MRSA Assay (FDA approved for NASAL specimens only), is one component of a comprehensive MRSA colonization surveillance program. It is not intended to diagnose MRSA infection nor to guide or monitor treatment for MRSA infections. Test performance is not FDA approved in patients less than 61 years old. Performed at [facility], [place] [address], [place], [place] [address]   Culture, blood (routine x 2)     Status: None   Collection Time: 04/23/21 12:11 PM   Specimen: BLOOD  Result Value Ref Range Status   Specimen Description BLOOD SITE NOT SPECIFIED  Final   Special Requests   Final    BOTTLES DRAWN AEROBIC AND ANAEROBIC Blood Culture adequate volume   Culture   Final    NO GROWTH 5 DAYS Performed at [facility], [place] [address] [place], [place] [address]    Report Status 04/28/2021 FINAL  Final  Culture, blood (routine x 2)     Status: None   Collection Time: 04/23/21 12:35 PM   Specimen: BLOOD  Result Value Ref Range Status   Specimen Description BLOOD SITE NOT SPECIFIED  Final   Special Requests   Final    BOTTLES DRAWN AEROBIC AND ANAEROBIC Blood Culture adequate volume   Culture   Final    NO GROWTH 5 DAYS Performed at [facility], [place] [address], [place], [place] [address]    Report Status 04/28/2021 FINAL  Final  Peritoneal fluid culture w Gram Stain     Status: None   Collection Time: 04/23/21  4:49 PM   Specimen: Peritoneal  [place]; Peritoneal Fluid  Result Value Ref Range Status   Specimen Description PERITONEAL  Final   Special Requests NONE  Final   Gram Stain   Final    FEW WBC PRESENT, PREDOMINANTLY MONONUCLEAR NO ORGANISMS SEEN    Culture    Final    NO GROWTH 3 DAYS Performed at [facility], [address] [place],  [address]    Report Status 04/26/2021 FINAL  Final          Radiology Studies: US Abdomen Limited  Result Date: 04/29/2021 CLINICAL DATA:  ASCITES EXAM: LIMITED ABDOMEN ULTRASOUND FOR ASCITES TECHNIQUE: Limited ultrasound survey for ascites was performed in all four abdominal quadrants. COMPARISON:  None. FINDINGS: Sonographic evaluation of the 4 quadrants of the abdomen demonstrates only small volume ascites with multiple loops of mildly dilated and fluid-filled small bowel. There was no safe window to allow for paracentesis. IMPRESSION: Small volume ascites, insufficient for paracentesis. Several loops of fluid-filled small bowel visualized. Recommend clinical correlation for evidence of small-bowel obstruction or ileus. Electronically Signed   By: Jacqulynn Cadet M.D.   On: 04/29/2021 10:34        Scheduled Meds:  lactulose  30 g Oral BID   lidocaine (PF)       mouth rinse  15 mL Mouth Rinse BID   midodrine  15 mg Oral TID WC   multivitamin with minerals  1 tablet Oral Daily   OLANZapine  5 mg Oral BID   pantoprazole  40 mg Oral QHS   sertraline  25 mg Oral QPC breakfast   Continuous Infusions:  sodium chloride Stopped (04/23/21 0504)     LOS: 7 days        Hosie Poisson, MD [facility]   To contact the attending provider between 7A-7P or the covering provider during after hours 7P-7A, please log into the web site [email or web address] and access using universal [facility] password for that web site. If you do not have the password, please call the hospital operator.  04/29/2021, 3:59 PM

## 2021-04-30 ENCOUNTER — Encounter (HOSPITAL_COMMUNITY): Payer: Self-pay | Admitting: Hematology

## 2021-04-30 ENCOUNTER — Other Ambulatory Visit (HOSPITAL_COMMUNITY): Payer: Self-pay

## 2021-04-30 DIAGNOSIS — I10 Essential (primary) hypertension: Secondary | ICD-10-CM

## 2021-04-30 DIAGNOSIS — K7210 Chronic hepatic failure without coma: Secondary | ICD-10-CM

## 2021-04-30 DIAGNOSIS — F331 Major depressive disorder, recurrent, moderate: Secondary | ICD-10-CM | POA: Diagnosis present

## 2021-04-30 DIAGNOSIS — E440 Moderate protein-calorie malnutrition: Secondary | ICD-10-CM | POA: Diagnosis present

## 2021-04-30 DIAGNOSIS — K7290 Hepatic failure, unspecified without coma: Secondary | ICD-10-CM | POA: Diagnosis present

## 2021-04-30 DIAGNOSIS — R188 Other ascites: Secondary | ICD-10-CM

## 2021-04-30 LAB — BASIC METABOLIC PANEL
Anion gap: 10 (ref 5–15)
BUN: 16 mg/dL (ref 6–20)
CO2: 21 mmol/L — ABNORMAL LOW (ref 22–32)
Calcium: 8.2 mg/dL — ABNORMAL LOW (ref 8.9–10.3)
Chloride: 102 mmol/L (ref 98–111)
Creatinine, Ser: 0.75 mg/dL (ref 0.44–1.00)
GFR, Estimated: >60 mL/min (ref >60)
Glucose, Bld: 96 mg/dL (ref 70–99)
Potassium: 3.5 mmol/L (ref 3.5–5.1)
Sodium: 133 mmol/L — ABNORMAL LOW (ref 135–145)

## 2021-04-30 LAB — GLUCOSE, CAPILLARY: Glucose-Capillary: 137 mg/dL — ABNORMAL HIGH (ref 70–99)

## 2021-04-30 LAB — AMMONIA: Ammonia: 82 umol/L — ABNORMAL HIGH (ref 9–35)

## 2021-04-30 MED ORDER — SERTRALINE HCL 25 MG PO TABS
25.0000 mg | ORAL_TABLET | Freq: Every day | ORAL | 1 refills | Status: AC
  Filled 2021-04-30: qty 30, 30d supply, fill #0

## 2021-04-30 MED ORDER — LACTULOSE ENCEPHALOPATHY 10 GM/15ML PO SOLN: 30.0000 g | Freq: Two times a day (BID) | ORAL | 3 refills | Status: DC

## 2021-04-30 MED ORDER — OLANZAPINE 5 MG PO TABS: 5.0000 mg | ORAL_TABLET | Freq: Every day | ORAL | Status: DC

## 2021-04-30 MED ORDER — CERTAVITE/ANTIOXIDANTS PO TABS
1.0000 | ORAL_TABLET | Freq: Every day | ORAL | 1 refills | Status: AC
  Filled 2021-04-30: qty 30, 30d supply, fill #0

## 2021-04-30 MED ORDER — MIDODRINE HCL 5 MG PO TABS: 15.0000 mg | ORAL_TABLET | Freq: Three times a day (TID) | ORAL | 1 refills | Status: DC

## 2021-04-30 MED ORDER — MIDODRINE HCL 5 MG PO TABS
15.0000 mg | ORAL_TABLET | Freq: Three times a day (TID) | ORAL | 1 refills | Status: AC
Start: 2021-04-30 — End: ?
  Filled 2021-04-30: qty 270, 30d supply, fill #0

## 2021-04-30 MED ORDER — LACTULOSE 10 GM/15ML PO SOLN
30.0000 g | Freq: Three times a day (TID) | ORAL | Status: DC
  Administered 2021-04-30 – 2021-05-10 (×27): 30 g via ORAL
  Filled 2021-04-30 (×29): qty 45

## 2021-04-30 MED ORDER — LACTATED RINGERS IV BOLUS
1000.0000 mL | Freq: Once | INTRAVENOUS | Status: AC
  Administered 2021-04-30: 1000 mL via INTRAVENOUS

## 2021-04-30 MED ORDER — LACTULOSE ENCEPHALOPATHY 10 GM/15ML PO SOLN
30.0000 g | Freq: Two times a day (BID) | ORAL | 3 refills | Status: DC
  Filled 2021-04-30: qty 1419, 16d supply, fill #0

## 2021-04-30 MED ORDER — BOOST / RESOURCE BREEZE PO LIQD CUSTOM
1.0000 | Freq: Three times a day (TID) | ORAL | Status: DC
  Administered 2021-04-30 – 2021-05-10 (×22): 1 via ORAL
  Filled 2021-04-30 (×2): qty 1

## 2021-04-30 MED ORDER — OLANZAPINE 5 MG PO TABS
2.5000 mg | ORAL_TABLET | Freq: Two times a day (BID) | ORAL | Status: DC | PRN
Start: 2021-04-30 — End: 2021-04-30

## 2021-04-30 NOTE — Progress Notes (Signed)
PROGRESS NOTE    Sharon Lawrence  YKZ:[id number] DOB: 04/27/1965 DOA: 04/22/2021 PCP: Sharon Spar, MD   Chief Complaint  Patient presents with   Weakness    Brief Narrative:  56 year old with prior history of coronary artery disease, STEMI in 2019, celiac disease, Sharon [facility], GI bleed, chronic pain syndrome, liver cirrhosis who presented with generalized weakness, nausea, vomiting and abdominal pain, admitted with undifferentiated shock septic/hypovolemic. Patient came off of vasopressors, SBP was ruled out with negative peritoneal fluid studies. Urine cultures show yeast. She was given a dose of diflucan in addition to completing the course of rocephin.   Pt seen and examined, she is alert and answering questions appropriately. Family to have discussions later on and decide about home hospice vs discharge home. Dr Laural Lawrence with GI, has graciously agreed to talk to the family regarding the prognosis.   Assessment & Plan:   Principal Problem:   Acute sepsis (Sharon [facility]) Active Problems:   Hypokalemia   Thrombocytopenia (HCC)   Pressure injury of skin   Moderate episode of recurrent major depressive disorder (HCC)   Malnutrition of moderate degree   Shock Differential includes sepsis versus hypovolemic shock. Urine cultures growing yeast. One dose of diflucan given.  Peritoneal fluid not consistent with SBP. Patient was weaned off vasopressors. Completed the course of IV Rocephin.   Suspected decompensated cirrhosis with ascites with a history of NAFLD So far work-up is negative for autoimmune hepatitis and viral hepatitis.  Progression of cirrhosis in the last few months. Evidence of portal hypertension is seen. Liver MRI showed Hepatic lesions are difficult to accurately characterize and to definitively use LI-RADS criteria but suspicious for hepatomas. Moderate volume abdominal ascites and diffuse body wall edema. GI recommended liver biopsy, but she refused biopsy, agreed for a repeat MRI  in 3 months.  AFP negative, mitochondrial antibody is negative. Alpha antitrypsin is 149.  Lactulose added and target about 2-3 bowel movements per day. Bilirubin has improved to 6.6 today.  Repeat labs today.     Elevated BNP and anasarca probably secondary to hypoalbuminemia secondary to cirrhosis. Echocardiogram shows hyperdynamic left ventricular ejection fraction. Mri liver showed Moderate volume abdominal ascites and diffuse body wall edema. Will start her on low dose lasix and monitor BP parameters.   US abdomen for paracentesis ordered, did not show enough fluid for paracentesis.    Chronic bilateral lower extremity weakness Therapy evaluations ordered. Home health PT/OT. She needs full supervision.  Resume home medications. Outpatient neurology follow-up recommended.   Acute metabolic encephalopathy:  Mental status worse than the last 2 days, suspect from zyprexa , with zoloft added recently.  She received a dose of zyprexa this am, will d/c it today.  Check ammonia levels .    Acute on chronic microcytic anemia and acute thrombocytopenia in the setting of liver cirrhosis and history of GI bleed in 2019 due to Brilinta S/p 2 units of prbc transfusion , hemoglobin improved to 9.2.  Transfuse to keep hemoglobin greater than 7. Monitor for signs of bleeding. Acute thrombocytopenia probably secondary to decompensated cirrhosis Platelets have stabilized     History of coronary artery disease, STEMI s/p PCI to RCA Patient currently denies any chest pain or shortness of breath.    History of celiac disease Continue with gluten-free diet   Unintentional weight loss Outpatient follow-up with PCP for appropriate cancer screening.   Pressure Injury  present on admission: Pressure Injury 04/22/21 Sacrum Right;Mid Stage 2 -  Partial thickness loss of dermis  presenting as a shallow open injury with a red, pink wound bed without slough. Pink wound bed (Active)  04/22/21  2145  Location: Sacrum  Location Orientation: Right;Mid  Staging: Stage 2 -  Partial thickness loss of dermis presenting as a shallow open injury with a red, pink wound bed without slough.  Wound Description (Comments): Pink wound bed  Present on Admission: Yes   Wound care will be consulted.    Hyponatremia, hypokalemia  Replaced.   Depression:  Flat affect, as per caregiver,is non compliant to meds and gluten free diet. Does not take care of herself.  Reports being depressed, and has refused to be put on anti depression meds in the past.  This morning she reports feeling depressed, denies suicidal ideation, has agreed to speak to psychiatry and take meds if needed. Psychiatry consulted. Will need to evaluate the patient to see if she has insight into her medical issues.  In view of her recurrent admissions, liver cirrhosis, non compliant to meds, diet, and care, palliative care consulted for goals of care discussions.  Psychiatry evaluated and started her on zoloft. Appreciate their help.    Family to decide regarding disposition.    DVT prophylaxis: scd's Code Status: DNR Family Communication: none at bedside.   Will discuss with sister today.  Disposition:   Status is: Inpatient Remains inpatient appropriate because: unsafe d/c plan.  As per palliative care, Sister [facility] [employer] cannot care for Sharon Lawrence. She needed 2 + assistance.            Consultants:  Gastroenterology.  Palliative care Psychiatry.   Procedures: MRI liver.   Antimicrobials:  Antibiotics Given (last 72 hours)     Date/Time Action Medication Dose Rate   04/28/21 1151 New Bag/Given   cefTRIAXone (ROCEPHIN) 1 g in sodium chloride 0.9 % 100 mL IVPB 1 g 200 mL/hr        Subjective: LETHARGIC, closing eyes in the middle of conversation.   Objective: Vitals:   04/30/21 0819 04/30/21 1000 04/30/21 1025 04/30/21 1124  BP: (!) 96/57 (!) 107/91 101/65 105/67  Pulse: 97 99 (!) 101 (!) 102  Resp: 10  10 11 12   Temp: 98.2 F (36.8 C) 98.2 F (36.8 C) 97.6 F (36.4 C) (!) 97.4 F (36.3 C)  TempSrc: Oral Oral Oral Oral  SpO2: 91% 91%    Weight:      Height:        Intake/Output Summary (Last 24 hours) at 04/30/2021 1545 Last data filed at 04/30/2021 0915 Gross per 24 hour  Intake 240 ml  Output --  Net 240 ml    Filed Weights   04/22/21 1410  Weight: 82.3 kg    Examination:  General exam: ill appearing lady, jaundiced.  Respiratory system: Clear to auscultation. Respiratory effort normal. Cardiovascular system: S1 & S2 heard, RRR. No JVD,  Gastrointestinal system: Abdomen is soft, distended, bowel sounds wnl.  Central nervous system: lethargic, able to tell me her name and oriented to the person and place only. Somnolent.  Extremities: edema has improved.  Skin: bruising over the upper and lower extremities.  Psychiatry: unable to assess.        Data Reviewed: I have personally reviewed following labs and imaging studies  CBC: Recent Labs  Lab 04/25/21 0550 04/26/21 0212 04/27/21 0438 04/28/21 0101 04/29/21 0242  WBC 10.5 12.1* 11.0* 14.0* 13.8*  NEUTROABS 6.8 8.3* 7.7 9.8* 8.9*  HGB 8.0* 7.5* 7.2* 9.2* 9.2*  HCT 21.7* 20.8* 20.7* 25.8* 25.6*  MCV 79.8* 80.6 81.8 79.9* 79.3*  PLT 95* 93* 98* 97* 110*     Basic Metabolic Panel: Recent Labs  Lab 04/24/21 0823 04/25/21 0550 04/26/21 0212 04/27/21 0438 04/28/21 0101 04/29/21 0242  NA  --  133* 132* 134* 136 134*  K  --  3.1* 3.3* 3.8 3.9 3.7  CL  --  99 101 104 105 104  CO2  --  23 20* 21* 18* 20*  GLUCOSE  --  84 95 102* 111* 119*  BUN  --  11 12 13 16 19   CREATININE  --  0.76 0.75 0.74 0.83 0.81  CALCIUM  --  7.9* 7.7* 8.0* 8.2* 8.3*  MG 2.4  --   --   --   --   --      GFR: Estimated Creatinine Clearance: 76.3 mL/min (by C-G formula based on SCr of 0.81 mg/dL).  Liver Function Tests: Recent Labs  Lab 04/25/21 0550 04/26/21 0212 04/27/21 0438 04/28/21 0101 04/29/21 0242  AST 45*  49* 52* 48* 55*  ALT 14 14 16 16 17   ALKPHOS 97 103 109 116 120  BILITOT 6.3* 6.5* 6.7* 8.6* 6.6*  PROT 4.8* 4.8* 4.9* 5.1* 5.1*  ALBUMIN 2.4* 2.3* 2.3* 2.4* 2.5*     CBG: Recent Labs  Lab 04/23/21 1727 04/24/21 1946  GLUCAP 100* 91      Recent Results (from the past 240 hour(s))  Urine Culture     Status: Abnormal   Collection Time: 04/22/21  2:13 PM   Specimen: Urine, Catheterized  Result Value Ref Range Status   Specimen Description URINE, CATHETERIZED  Final   Special Requests   Final    NONE Performed at [facility], [place] [address], [place], [place] [address]    Culture >=100,000 COLONIES/mL YEAST (A)  Final   Report Status 04/24/2021 FINAL  Final  Resp Panel by RT-PCR (Flu A&B, Covid) Nasopharyngeal Swab     Status: None   Collection Time: 04/22/21  7:36 PM   Specimen: Nasopharyngeal Swab; Nasopharyngeal(NP) swabs in vial transport medium  Result Value Ref Range Status   SARS Coronavirus 2 by RT PCR NEGATIVE NEGATIVE Final    Comment: (NOTE) SARS-CoV-2 target nucleic acids are NOT DETECTED.  The SARS-CoV-2 RNA is generally detectable in upper respiratory specimens during the acute phase of infection. The lowest concentration of SARS-CoV-2 viral copies this assay can detect is 138 copies/mL. A negative result does not preclude SARS-Cov-2 infection and should not be used as the sole basis for treatment or other patient management decisions. A negative result may occur with  improper specimen collection/handling, submission of specimen other than nasopharyngeal swab, presence of viral mutation(s) within the areas targeted by this assay, and inadequate number of viral copies(<138 copies/mL). A negative result must be combined with clinical observations, patient history, and epidemiological information. The expected result is Negative.  Fact Sheet for Patients:  EntrepreneurPulse.com.au  Fact Sheet for Healthcare Providers:   IncredibleEmployment.be  This test is no t yet approved or cleared by the [place] FDA and  has been authorized for detection and/or diagnosis of SARS-CoV-2 by FDA under an Emergency Use Authorization (EUA). This EUA will remain  in effect (meaning this test can be used) for the duration of the COVID-19 declaration under Section 564(b)(1) of the Act, 21 U.S.C.section 360bbb-3(b)(1), unless the authorization is terminated  or revoked sooner.       Influenza A by PCR NEGATIVE NEGATIVE Final   Influenza B by  PCR NEGATIVE NEGATIVE Final    Comment: (NOTE) The Xpert Xpress SARS-CoV-2/FLU/RSV plus assay is intended as an aid in the diagnosis of influenza from Nasopharyngeal swab specimens and should not be used as a sole basis for treatment. Nasal washings and aspirates are unacceptable for Xpert Xpress SARS-CoV-2/FLU/RSV testing.  Fact Sheet for Patients: EntrepreneurPulse.com.au  Fact Sheet for Healthcare Providers: IncredibleEmployment.be  This test is not yet approved or cleared by the [place] FDA and has been authorized for detection and/or diagnosis of SARS-CoV-2 by FDA under an Emergency Use Authorization (EUA). This EUA will remain in effect (meaning this test can be used) for the duration of the COVID-19 declaration under Section 564(b)(1) of the Act, 21 U.S.C. section 360bbb-3(b)(1), unless the authorization is terminated or revoked.  Performed at [facility], [address] [place], [place] [address]   MRSA Next Gen by PCR, Nasal     Status: None   Collection Time: 04/22/21  9:38 PM   Specimen: Nasal Mucosa; Nasal Swab  Result Value Ref Range Status   MRSA by PCR Next Gen NOT DETECTED NOT DETECTED Final    Comment: (NOTE) The GeneXpert MRSA Assay (FDA approved for NASAL specimens only), is one component of a comprehensive MRSA colonization surveillance program. It is not intended to diagnose MRSA  infection nor to guide or monitor treatment for MRSA infections. Test performance is not FDA approved in patients less than 24 years old. Performed at [facility], [place] [address] [place], [place] [address]   Culture, blood (routine x 2)     Status: None   Collection Time: 04/23/21 12:11 PM   Specimen: BLOOD  Result Value Ref Range Status   Specimen Description BLOOD SITE NOT SPECIFIED  Final   Special Requests   Final    BOTTLES DRAWN AEROBIC AND ANAEROBIC Blood Culture adequate volume   Culture   Final    NO GROWTH 5 DAYS Performed at [facility], [place] [address], [place], [place] [address]    Report Status 04/28/2021 FINAL  Final  Culture, blood (routine x 2)     Status: None   Collection Time: 04/23/21 12:35 PM   Specimen: BLOOD  Result Value Ref Range Status   Specimen Description BLOOD SITE NOT SPECIFIED  Final   Special Requests   Final    BOTTLES DRAWN AEROBIC AND ANAEROBIC Blood Culture adequate volume   Culture   Final    NO GROWTH 5 DAYS Performed at [facility], [place] [address], [place], [place] [address]    Report Status 04/28/2021 FINAL  Final  Peritoneal fluid culture w Gram Stain     Status: None   Collection Time: 04/23/21  4:49 PM   Specimen: Peritoneal Washings; Peritoneal Fluid  Result Value Ref Range Status   Specimen Description PERITONEAL  Final   Special Requests NONE  Final   Gram Stain   Final    FEW WBC PRESENT, PREDOMINANTLY MONONUCLEAR NO ORGANISMS SEEN    Culture   Final    NO GROWTH 3 DAYS Performed at [facility], [address], [place],  [address]    Report Status 04/26/2021 FINAL  Final          Radiology Studies: US Abdomen Limited  Result Date: 04/29/2021 CLINICAL DATA:  ASCITES EXAM: LIMITED ABDOMEN ULTRASOUND FOR ASCITES TECHNIQUE: Limited ultrasound survey for ascites was performed in all four abdominal quadrants. COMPARISON:  None. FINDINGS: Sonographic evaluation of the 4 quadrants  of the  abdomen demonstrates only small volume ascites with multiple loops of mildly dilated and fluid-filled small bowel. There was no safe window to allow for paracentesis. IMPRESSION: Small volume ascites, insufficient for paracentesis. Several loops of fluid-filled small bowel visualized. Recommend clinical correlation for evidence of small-bowel obstruction or ileus. Electronically Signed   By: Jacqulynn Cadet M.D.   On: 04/29/2021 10:34        Scheduled Meds:  feeding supplement  1 Container Oral TID BM   lactulose  30 g Oral TID   mouth rinse  15 mL Mouth Rinse BID   midodrine  15 mg Oral TID WC   multivitamin with minerals  1 tablet Oral Daily   pantoprazole  40 mg Oral QHS   sertraline  25 mg Oral QPC breakfast   Continuous Infusions:  sodium chloride Stopped (04/23/21 0504)     LOS: 8 days        Hosie Poisson, MD [facility]   To contact the attending provider between 7A-7P or the covering provider during after hours 7P-7A, please log into the web site [email or web address] and access using universal [place] password for that web site. If you do not have the password, please call the [facility] operator.  04/30/2021, 3:45 PM

## 2021-04-30 NOTE — Progress Notes (Signed)
This chaplain responded to PMT consult for creating/updating the Pt. Advance Directive: HCPOA and Living Will.   The Pt. was able to answer the chaplai's clarifying questions on the content of her AD. The document is filled out and ready for a notary visit. The document is on the bedside table. The chaplain will update the Pt. RN-Elisa of the time this service can be provided.  Chaplain Sallyanne Kuster [phone]

## 2021-04-30 NOTE — Plan of Care (Signed)

## 2021-04-30 NOTE — Progress Notes (Signed)
Nutrition Follow-up  DOCUMENTATION CODES:   Non-severe (moderate) malnutrition in context of chronic illness  INTERVENTION:   Boost Breeze po TID, each supplement provides 250 kcal and 9 grams of protein.  Continue MVI with minerals daily.  NUTRITION DIAGNOSIS:   Moderate Malnutrition related to chronic illness (NASH, celiac disease) as evidenced by mild fat depletion, mild muscle depletion.  GOAL:   Patient will meet greater than or equal to 90% of their needs  MONITOR:   PO intake, Supplement acceptance, Labs, Skin  REASON FOR ASSESSMENT:   Consult Assessment of nutrition requirement/status  ASSESSMENT:   56 yo female admitted with septic vs hypovolemic shock, acute UTI, decompensated liver cirrhosis. PMH  includes CAD, STEMI, celiac disease, GIB, NASH, chronic pain.  S/P paracentesis on 2/20. Peritoneal fluid analysis not consistent with SBP  Patient more alert today than last week. She is still sleepy during RD visit. She has a large plate of fruit in front of her, but has not eaten any.  Patient typically drinks Carnation Breakfast Essentials liquid or powder at home. Noticed that the powder is made on equipment that also processes wheat, so kitchen has not been sending these supplements. She agreed to try Boost Breeze PO supplement instead. Patient has a history of medical noncompliance, including medications and gluten free diet. Palliative Care team is following.  Chaplain outside of room waiting on notary and witness to help patient fill out advanced directives.   Labs reviewed. Na 134 (2/26)  Medications reviewed and include Lasix, lactulose, MVI with minerals (patient refused this morning), Protonix.  No new weight available since admission.   Stage 2 PI to sacrum present on admission.   NUTRITION - FOCUSED PHYSICAL EXAM:  Flowsheet Row Most Recent Value  Orbital Region Mild depletion  Upper Arm Region Mild depletion  Thoracic and Lumbar Region Mild  depletion  Buccal Region Mild depletion  Temple Region Mild depletion  Clavicle Bone Region Mild depletion  Clavicle and Acromion Bone Region Mild depletion  Scapular Bone Region No depletion  Dorsal Hand Mild depletion  Patellar Region No depletion  Anterior Thigh Region No depletion  Posterior Calf Region No depletion  Edema (RD Assessment) Moderate  Hair Reviewed  Eyes Reviewed  Mouth Reviewed  Skin Reviewed  [jaundice]  Nails Reviewed       Diet Order:   Diet Order             Diet gluten free Room service appropriate? Yes; Fluid consistency: Thin  Diet effective now                   EDUCATION NEEDS:   Not appropriate for education at this time  Skin:  Skin Assessment: Skin Integrity Issues: Skin Integrity Issues:: Stage II Stage II: sacrum  Last BM:  2/24  Height:   Ht Readings from Last 1 Encounters:  04/22/21 5' (1.524 m)    Weight:   Wt Readings from Last 1 Encounters:  04/22/21 82.3 kg    BMI:  Body mass index is 35.43 kg/m.  Estimated Nutritional Needs:   Kcal:  2000-2300  Protein:  90-105 gm  Fluid:  1.8-2 L    Lucas Mallow RD, LDN, CNSC Please refer to Amion for contact information.

## 2021-04-30 NOTE — Progress Notes (Signed)
Physical Therapy Treatment Patient Details Name: Sharon Lawrence MRN: [id number] DOB: 10/08/1965 Today's Date: 04/30/2021   History of Present Illness Pt presented to [facility] for nausea and vomiting on 2/19. Pt with septic shock and UTI. PMH - STEMI, ADHD, anxiety, chronic pain, CAD, Celiac disease,    PT Comments    Pt with definite worsening of mobility and demonstrating ataxic movements with UE not present when I last saw her last week. Pt also with some myoclonic type jerking sitting EOB. Family may need more equipment (hoyer lift, hospital bed) to take care of her at home. If the care for her is too much for them pt may need alternative plan.    Recommendations for follow up therapy are one component of a multi-disciplinary discharge planning process, led by the attending physician.  Recommendations may be updated based on patient status, additional functional criteria and insurance authorization.  Follow Up Recommendations  Home health PT     Assistance Recommended at Discharge Frequent or constant Supervision/Assistance  Patient can return home with the following Two people to help with walking and/or transfers   Equipment Recommendations  Other (comment) (hoyer lift)    Recommendations for Other Services       Precautions / Restrictions Precautions Precautions: Fall Precaution Comments: watch BP Restrictions Weight Bearing Restrictions: No     Mobility  Bed Mobility Overal bed mobility: Needs Assistance Bed Mobility: Supine to Sit, Sit to Supine     Supine to sit: Max assist, +2 for safety/equipment, +2 for physical assistance, HOB elevated Sit to supine: Total assist, +2 for physical assistance, +2 for safety/equipment   General bed mobility comments: able to assist in moving B LE to EOB, cues to use bedrail but unable to hold bedrail due to ataxic movements, heavy assist to lift trunk. Total A x 2 to get  back to bed with poor extremity control    Transfers                    General transfer comment: deferred due to involuntary jerking movements, poor sitting balance    Ambulation/Gait                   Stairs             Wheelchair Mobility    Modified Rankin (Stroke Patients Only)       Balance Overall balance assessment: Needs assistance Sitting-balance support: Bilateral upper extremity supported, Feet supported Sitting balance-Leahy Scale: Poor Sitting balance - Comments: Mod A x to Max A to maintain static sitting balance. Intermittent involuntary jerking movements noted causing pt to lose balance as well                                    Cognition Arousal/Alertness: Awake/alert, Lethargic Behavior During Therapy: Flat affect Overall Cognitive Status: Impaired/Different from baseline Area of Impairment: Following commands, Safety/judgement, Awareness, Problem solving                       Following Commands: Follows one step commands with increased time, Follows one step commands consistently Safety/Judgement: Decreased awareness of deficits Awareness: Emergent Problem Solving: Slow processing, Requires verbal cues, Difficulty sequencing General Comments: pleasant, slow processing and intermittent lethargy/keeping eyes closed. Follows directions consistently        Exercises      General Comments General comments (skin integrity,  edema, etc.): VSS      Pertinent Vitals/Pain Pain Assessment Pain Assessment: Faces Faces Pain Scale: Hurts a little bit Pain Location: back Pain Descriptors / Indicators: Grimacing Pain Intervention(s): Monitored during session    Home Living                          Prior Function            PT Goals (current goals can now be found in the care plan section) Progress towards PT goals: Not progressing toward goals - comment    Frequency    Min 3X/week      PT Plan Current plan remains appropriate    Co-evaluation  PT/OT/SLP Co-Evaluation/Treatment: Yes Reason for Co-Treatment: Complexity of the patient's impairments (multi-system involvement);For patient/therapist safety PT goals addressed during session: Mobility/safety with mobility;Balance OT goals addressed during session: ADL's and self-care      AM-PAC PT "6 Clicks" Mobility   Outcome Measure  Help needed turning from your back to your side while in a flat bed without using bedrails?: A Lot Help needed moving from lying on your back to sitting on the side of a flat bed without using bedrails?: Total Help needed moving to and from a bed to a chair (including a wheelchair)?: Total Help needed standing up from a chair using your arms (e.g., wheelchair or bedside chair)?: Total Help needed to walk in hospital room?: Total Help needed climbing 3-5 steps with a railing? : Total 6 Click Score: 7    End of Session   Activity Tolerance: Patient limited by fatigue Patient left: in bed;with call bell/phone within reach;with bed alarm set Nurse Communication: Mobility status;Need for lift equipment PT Visit Diagnosis: Unsteadiness on feet (R26.81);Other abnormalities of gait and mobility (R26.89);History of falling (Z91.81)     Time: 1007-1219 PT Time Calculation (min) (ACUTE ONLY): 23 min  Charges:  $Therapeutic Activity: 8-22 mins                     [place] Pager [phone] Office [place] 04/30/2021, 2:59 PM

## 2021-04-30 NOTE — Progress Notes (Addendum)
HOSPITAL MEDICINE OVERNIGHT EVENT NOTE    Notified by nursing that patient experienced an episode of agitation, tremulousness and a single hypotensive blood pressure of 64/55.  Rapid response was contacted and promptly came to evaluate the patient at the bedside.  Per my review of the chart patient has not received any blood pressures in the past several hours such as antihypertensives or opiate-based analgesics that would have precipitated this episode of hypotension.  Patient was placed in Trendelenburg with repeat blood pressure showing dramatic improvement to 123/88.  Clinically, rapid response reports the patient appears dry with poor skin turgor and dry mucous membranes.  1 L isotonic fluid bolus being administered.  Upon my discussion with the nurse tremulousness and anxiety spontaneously resolving.  Obtaining ECG, monitoring closely.   Vernelle Emerald  MD [facility]

## 2021-04-30 NOTE — Significant Event (Signed)
Rapid Response Event Note   Reason for Call : hypotension  Initial Focused Assessment:  Nursing staff notified me of pt with BP 64/55. Instructed RN to initiate LR bolus 1L over the telephone. Upon arrival, Ms. Filter was in trendelenburg position with IVF bolus infusing. BP 123/88. She is drowsy but able to answer simple questions and oriented to self and situation. Disoriented to time and place. Skin is pink warm and dry. Anasarca and generalized dependent pitting edema. Unsure of recent urine output (one unmeasured void at 0900) Extremities have poor skin turgor, oral mucosa and tongue dry. Icteric and jaundice. CBG 137. Pt denies CP and SOB. C/o back and leg pain (chronic). Pt responding to volume resuscitation and will reassess after bolus. I believe we could push her volume more if needed because her EF 60-65%. Discussed plan with Dr. Cyd Silence over the phone.   2010-97.6 F, 116/54 (74), RR 18 with sats 97% on RA. LR bolus still infusing (500cc left)  Interventions:  -1L Lactated Ringers     Plan of Care:  -May repeat bolus if remains hypotensive     MD Notified: 2019 Dr. Cyd Silence Call Time: 1958 Arrival Time: 2004 End Time:   Madelynn Done, RN

## 2021-04-30 NOTE — Consult Note (Signed)
[facility] New Face-to-Face Psychiatric Evaluation   Service Date: April 30, 2021 LOS: 8  Assessment  Sharon Lawrence is a 56 y.o. female admitted medically for 04/22/2021  2:13 PM for Sepsis [facility]). She carries the psychiatric diagnoses of MDD, GAD, panic disorder, alcohol abuse in remission, SA (choking herself as teen) without hospitalization and has a past medical history of CAD, Celiac disease, cirrhosis 2/2 previously presumed NASH, CAD, STEMI 2019.  Psychiatry was consulted for depression evaluation by Hosie Poisson, MD.  Her initial presentation of guilt, hopelessness, hypersomnia, decreased appetite, decreased energy, apathy, anhedonia since 7-8 months ago is most consistent with MDD, although likely contributed to by worsening of liver failure.  She also reported symptoms of excessive worrying and feeling overwhelmed because of sleep disturbances, muscle tension, fatigue, irritability, consistent with GAD.  She also reported being fearful of crowds and having panic attacks, will inquire further when patient is more alert to assess for possible agoraphobia and panic disorder (would not change management). She had mild delirium on exam 2/27 with deficits largely in maintaining attention. She was started on sertraline 25 mg qD by psychiatry after initial visit; is on olanzapine per GI (offlabel for nausea). She was able to clearly express that maintaining alertness is most important to her and we will be reducing olanzapine today.   Patient does not require inpatient psychiatric hospitalization.  We recommend continuing low-dose Zoloft for MDD and GAD and titrating up as tolerated, while monitoring for hyponatremia and GI symptoms.   Please see plan below for detailed recommendations.  Diagnoses:  Principal Problem:   Acute sepsis ([place]) Active Problems:   Hypokalemia   Thrombocytopenia (HCC)   Pressure injury of skin  Plan  ## Safety and Observation Level:  - Based on  my clinical evaluation, I estimate the patient to be at low risk of self harm in the current setting - At this time, we recommend a routine level of observation. This decision is based on my review of the chart including patient's history and current presentation, interview of the patient, mental status examination, and consideration of suicide risk including evaluating suicidal ideation, plan, intent, suicidal or self-harm behaviors, risk factors, and protective factors. This judgment is based on our ability to directly address suicide risk, implement suicide prevention strategies and develop a safety plan while the patient is in the clinical setting. Please contact our team if there is a concern that risk level has changed.   ## Medications:  -- continue Zoloft 25 mg daily, with plans to titrate up as tolerated -- decrease olanzapine from 5 BID to 5 QHS and 2.5 BID PRN.   ## Medical Decision Making Capacity:  -- Not formally assessed. Mild delirium on exam but benefited from questions being explained multiple times in different ways; would utilize teachbacks for major decisions.   ## Further Work-up:  -- Per primary team  -- Most recent EKG on 2/21 had QTc 505, HR 97 (qtc 467 using fredericia correction, no need to stop antipsychotic)  ## Disposition:  -- Per primary team pending clinical improvement  Thank you for this consult request. Recommendations have been communicated to the primary team.  We will follow at this time.   [place] A Tarissa Kerin  NEW history  Relevant Aspects of Hospital Course:  Admitted on 04/22/2021 for Sepsis [facility]).  Patient Report:  Patient seen in AM with Dr. Alfonse Spruce. She is able to convey that olanzapine was ordered off label for nausea. She has not  been experiencing any worsening of nausea or headaches since starting sertraline. She has noted that she generally feels fatigued during the day. We had a long discussion on olanzapine and agreed to hold AM dose and  offer PRN through the day (pt needed frequent re-orientation to discussion). She is oriented but has marked deficits in attention noticeable on conversation. She is able to to Sun--F on DOWB and not able to participate in [place] (failed teachback when reading A/B). Becomes tearful and states "staying awake is the most important thing" because she wants to work with PT to get stronger; has hope for eventual recovery from illness.   Patient denied SI/HI. States she sometimes gets confused when she is falling asleep and thinks she has something in her hands, but denies any AH/VH outside of these periods. Patient was not grossly responding to internal/external stimuli during encounter.  Patient was amenable to the plan discussed and had no other concerns.  ROS:  Confusion, sedation  Please see initial consult note for psychiatric, social  history.   Family History:  The patient's family history includes Anxiety disorder in her sister; Bipolar disorder in her son; CAD in her maternal grandfather and paternal grandmother; Cancer in her mother and sister; Cirrhosis in her paternal grandfather; Depression in her sister; Diabetes in her paternal grandmother; Drug abuse in her son; Hypertension in her father and mother; Hypothyroidism in her father and paternal grandmother.  Medical History: Past Medical History:  Diagnosis Date   Acute ST elevation myocardial infarction (STEMI) of inferior wall ([place]) 09/15/2017   ADHD (attention deficit hyperactivity disorder)    Anxiety    B12 deficiency 02/07/2011   Back pain    CAD (coronary artery disease)    a. s/p recent STEMI on 09/15/2017 with DES to RCA   Celiac disease    Folic acid deficiency 44/10/1854   GI bleed 11/06/2017   Headache    History of ST elevation myocardial infarction (STEMI) 11/06/2017   Iron deficiency anemia 02/07/2011   Myocardial infarction ([place]) 09/15/2017   Syncope and collapse 11/06/2017    Surgical History: Past Surgical History:   Procedure Laterality Date   BIOPSY  07/12/2020   Procedure: BIOPSY;  Surgeon: Rogene Houston, MD;  [facility];  Service: Endoscopy;;  duodenum(2nd part);antral   CHOLECYSTECTOMY     COLONOSCOPY WITH PROPOFOL N/A 09/26/2017   rehman examined portion of ileum normal, - Five small, non-bleeding polyps in the rectum, in the sigmoid colon and at the hepatic   CORONARY STENT INTERVENTION Right 09/15/2017   Procedure: CORONARY STENT INTERVENTION;  Surgeon: Jettie Booze, MD;  Location: [place] CV LAB;  Service: Cardiovascular;  Laterality: Right;  RCA   ESOPHAGOGASTRODUODENOSCOPY (EGD) WITH PROPOFOL N/A 07/12/2020   rehman: normal hypopharynx, Normal esophagus. z line regular 35 cm from incisors, congested and erythematous mucosa in antrum, mucosal changes in duodenum, very pronounced changes to suggest active celiac disease   KNEE CARTILAGE SURGERY Right    LEFT HEART CATH AND CORONARY ANGIOGRAPHY N/A 09/15/2017   Procedure: LEFT HEART CATH AND CORONARY ANGIOGRAPHY;  Surgeon: Jettie Booze, MD;  Location: [place] CV LAB;  Service: Cardiovascular;  Laterality: N/A;   TONSILLECTOMY      Medications:   Current Facility-Administered Medications:    0.9 %  sodium chloride infusion, , Intravenous, PRN, Margaretha Seeds, MD, Paused at 04/23/21 3149   acetaminophen (TYLENOL) tablet 650 mg, 650 mg, Oral, Q6H PRN, Jacky Kindle, MD   furosemide (LASIX)  tablet 20 mg, 20 mg, Oral, Daily, Hosie Poisson, MD, 20 mg at 04/30/21 0916   lactulose (CHRONULAC) 10 GM/15ML solution 30 g, 30 g, Oral, BID, Vena Rua, PA-C, 30 g at 04/30/21 1610   MEDLINE mouth rinse, 15 mL, Mouth Rinse, BID, Jacky Kindle, MD, 15 mL at 04/29/21 0818   midodrine (PROAMATINE) tablet 15 mg, 15 mg, Oral, TID WC, Jose Persia, MD, 15 mg at 04/30/21 9604   multivitamin with minerals tablet 1 tablet, 1 tablet, Oral, Daily, Jacky Kindle, MD, 1 tablet at 04/29/21 0817   OLANZapine (ZYPREXA)  tablet 2.5 mg, 2.5 mg, Oral, BID PRN, Valla Pacey, Excell Seltzer ON 05/01/2021] OLANZapine (ZYPREXA) tablet 5 mg, 5 mg, Oral, QHS, Sharesa Kemp A   ondansetron (ZOFRAN) injection 4 mg, 4 mg, Intravenous, Q8H PRN, Eliezer Bottom T, MD, 4 mg at 04/29/21 1534   oxyCODONE (Oxy IR/ROXICODONE) immediate release tablet 5 mg, 5 mg, Oral, Q6H PRN, Jacky Kindle, MD, 5 mg at 04/29/21 1550   pantoprazole (PROTONIX) EC tablet 40 mg, 40 mg, Oral, QHS, Chand, Sudham, MD, 40 mg at 04/29/21 2250   [COMPLETED] sertraline (ZOLOFT) tablet 25 mg, 25 mg, Oral, QPC lunch, 25 mg at 04/27/21 1138 **FOLLOWED BY** sertraline (ZOLOFT) tablet 25 mg, 25 mg, Oral, QPC breakfast, Merrily Brittle, DO, 25 mg at 04/30/21 0916   sodium chloride (OCEAN) 0.65 % nasal spray 1 spray, 1 spray, Each Nare, PRN, Jacky Kindle, MD  Allergies: Allergies  Allergen Reactions   Bee Venom Anaphylaxis   Erythromycin Anaphylaxis   Cephalexin    Neomycin-Bacitracin-Polymyxin  [Bacitracin-Neomycin-Polymyxin] Swelling    Sight of application   Neosporin Original [Bacitracin-Neomycin-Polymyxin] Swelling    Swelling at site of application   Prednisone    Sulfa Antibiotics Swelling    Sulfa eye drops caused eyes to swell Sulfa eye drops caused eyes to swell   Tobrex [Tobramycin] Swelling   Tramadol Hives   Penicillins Rash    Has patient had a PCN reaction causing immediate rash, facial/tongue/throat swelling, SOB or lightheadedness with hypotension: Yes Has patient had a PCN reaction causing severe rash involving mucus membranes or skin necrosis: No Has patient had a PCN reaction that required hospitalization: No Has patient had a PCN reaction occurring within the last 10 years: No If all of the above answers are "NO", then may proceed with Cephalosporin use.     Objective  Psychiatric Specialty Exam: Physical Exam Vitals and nursing note reviewed.  Constitutional:      General: She is not in acute distress.     Appearance: She is ill-appearing. She is not diaphoretic.  HENT:     Head: Normocephalic.  Pulmonary:     Effort: No respiratory distress.  Neurological:     General: No focal deficit present.     Mental Status: She is lethargic.     Review of Systems  Constitutional:  Positive for malaise/fatigue.  Respiratory:  Positive for shortness of breath.   Gastrointestinal:  Negative for nausea.  Psychiatric/Behavioral:  Positive for hallucinations.     Body mass index is 35.43 kg/m. Temp:  [97.4 F (36.3 C)-98.7 F (37.1 C)] 97.4 F (36.3 C) (02/27 1124) Pulse Rate:  [92-103] 102 (02/27 1124) Cardiac Rhythm: Normal sinus rhythm (02/26 2000) Resp:  [10-16] 12 (02/27 1124) BP: (96-124)/(57-91) 105/67 (02/27 1124) SpO2:  [91 %-95 %] 91 % (02/27 1000)  General Appearance: Appropriate for Environment (has done nails recently)    Eye Contact: Good    Speech: Slow (  frequent delay)    Volume: Decreased    Mood: -- ("Tired")   Affect: Congruent; Depressed; Restricted    Thought Process: Coherent; Goal Directed  Descriptions of Associations: Intact  Duration of Psychotic Symptoms:No data recorded Past Diagnosis of Schizophrenia or Psychoactive disorder:No data recorded  Orientation: Full (Time, Place and Person)   Thought Content: Scattered  Hallucinations: Hallucinations: -- (vaguely references hyponopmpic VH)   Ideas of Reference: None   Suicidal Thoughts: No  Suicidal Thoughts: No   Homicidal Thoughts: No  Homicidal Thoughts: No    Memory: Immediate Fair; Recent Fair    Judgement: Fair  Insight: Poor (does not seem aware of overall prognosis)    Psychomotor Activity: Decreased; Psychomotor Retardation    Concentration: Poor  Attention Span: Poor (marked deficits in conversation. often answers question from 1-2 minutes ago and not issue at hand. did poorly on both SAVEAHAART and DOWB.)   Recall: [employer] of Knowledge: Fair     Language: Good    Handed: Right    Assets: Leisure Time; Social Support    Sleep: Fair     AIMS:   , ,  ,  ,     CIWA:  COWS:      Signed: [place] [facility] Resident, PGY-1 [facility] 04/30/2021, 11:47 AM

## 2021-04-30 NOTE — Progress Notes (Addendum)
This chaplain is present with the Pt., notary, and two witnesses for the notarizing of the Pt. Advance Directive:  HCPOA and Living Will.  The Pt. named Donnal Debar as her HCPOA. If the HCPOA is unwilling or unable to serve in this role the Pt. names [place] as her next choice.  The chaplain gave the Pt. the original AD along with two copies. A copy of the Pt. AD was scanned into the Pt. EMR. The chaplain placed the Pt documents in the Pt. black bag in the wardrobe.  This chaplain is available for F/U spiritual care as needed.  Chaplain Sallyanne Kuster [phone]

## 2021-04-30 NOTE — Progress Notes (Signed)
Occupational Therapy Treatment Patient Details Name: Sharon Lawrence MRN: [id number] DOB: 1965-03-21 Today's Date: 04/30/2021   History of present illness Pt [facility] for [facility] and vomiting on 2/19. Pt with septic shock and UTI. PMH - STEMI, ADHD, anxiety, chronic pain, CAD, Celiac disease,   OT comments  Pt with noted decline from initial presentation to OT. Pt agreeable to sit EOB but requires +2 assist for bed mobility and requires Mod-Max A to maintain static sitting balance EOB. Unable to safely attempt OOB transfers this AM. Pt with ataxic movements noted, often undershooting targets which has negatively impacted ability to self feed. Pt requires at least Min A to successfully drink from cups and feed self finger foods. Pt reports desire to return home, noted ongoing [place] discussions with consideration of home hospice. If pt to DC home, would likely need hoyer lift for transfers OOB with ADL completion bed level. Will continue to follow acutely.  BP soft but stable throughout session, pt endorses dizziness   Recommendations for follow up therapy are one component of a multi-disciplinary discharge planning process, led by the attending physician.  Recommendations may be updated based on patient status, additional functional criteria and insurance authorization.    Follow Up Recommendations  Home health OT    Assistance Recommended at Discharge Frequent or constant Supervision/Assistance  Patient can return home with the following  Two people to help with walking and/or transfers;A lot of help with bathing/dressing/bathroom;Assist for transportation;Assistance with feeding;Direct supervision/assist for medications management   Equipment Recommendations  Hospital bed;Wheelchair (measurements OT);Wheelchair cushion (measurements OT);Other (comment) (Hoyer lift)    Recommendations for Other Services      Precautions / Restrictions Precautions Precautions: Fall Precaution Comments:  watch BP Restrictions Weight Bearing Restrictions: No       Mobility Bed Mobility Overal bed mobility: Needs Assistance Bed Mobility: Supine to Sit, Sit to Supine     Supine to sit: Max assist, +2 for safety/equipment, +2 for physical assistance, HOB elevated Sit to supine: Total assist, +2 for physical assistance, +2 for safety/equipment   General bed mobility comments: able to assist in moving B LE to EOB, cues to use bedrail but unable to hold bedrail due to ataxic movements, heavy assist to lift trunk. Total A x 2 to get  back to bed with poor extremity control    Transfers                   General transfer comment: deferred due to involuntary jerking movements, poor sitting balance     Balance Overall balance assessment: Needs assistance Sitting-balance support: Bilateral upper extremity supported, Feet supported Sitting balance-Leahy Scale: Poor Sitting balance - Comments: Mod A x to Max A to maintain static sitting balance. Intermittent involuntary jerking movements noted causing pt to lose balance as well                                   ADL either performed or assessed with clinical judgement   ADL Overall ADL's : Needs assistance/impaired Eating/Feeding: Minimal assistance;Bed level Eating/Feeding Details (indicate cue type and reason): due to ataxic movements, pt with difficulty accessing cup w/ lid on tray table. able to bring to mouth with poor coordination. pt reports difficulty using utensils - encouraged pt to use hands if finger food appropriate (had fruit tray today)  Lower Body Dressing: Total assistance;Bed level Lower Body Dressing Details (indicate cue type and reason): to don socks                    Extremity/Trunk Assessment Upper Extremity Assessment Upper Extremity Assessment: Generalized weakness;RUE deficits/detail;LUE deficits/detail RUE Deficits / Details: bruising on B UE, ataxic movements,  poor coordination and undershooting targets noted   Lower Extremity Assessment Lower Extremity Assessment: Defer to PT evaluation        Vision   Vision Assessment?: No apparent visual deficits   Perception     Praxis      Cognition Arousal/Alertness: Awake/alert, Lethargic Behavior During Therapy: Flat affect Overall Cognitive Status: Impaired/Different from baseline Area of Impairment: Following commands, Safety/judgement, Awareness, Problem solving                       Following Commands: Follows one step commands with increased time, Follows one step commands consistently Safety/Judgement: Decreased awareness of deficits Awareness: Emergent Problem Solving: Slow processing, Requires verbal cues, Difficulty sequencing General Comments: pleasant, slow processing and intermittent lethargy/keeping eyes closed. Follows directions consistently        Exercises      Shoulder Instructions       General Comments BP at rest: 96/72, BP sitting EOB 104/79, BP after sitting EOB > 5 min: 96/84    Pertinent Vitals/ Pain       Pain Assessment Pain Assessment: Faces Faces Pain Scale: Hurts a little bit Pain Location: back Pain Descriptors / Indicators: Grimacing Pain Intervention(s): Monitored during session  Home Living                                          Prior Functioning/Environment              Frequency  Min 2X/week        Progress Toward Goals  OT Goals(current goals can now be found in the care plan section)  Progress towards OT goals: Not progressing toward goals - comment (increased ataxic movements, poor sitting balance)  Acute Rehab OT Goals Patient Stated Goal: go home OT Goal Formulation: With patient Time For Goal Achievement: 05/04/21 Potential to Achieve Goals: Fair ADL Goals Pt Will Perform Grooming: with min assist;standing Pt Will Perform Lower Body Bathing: with mod assist;sit to/from stand Pt Will  Perform Lower Body Dressing: with mod assist;sit to/from stand Pt Will Transfer to Toilet: with mod assist;stand pivot transfer Pt Will Perform Toileting - Clothing Manipulation and hygiene: with mod assist;sit to/from stand  Plan Discharge plan remains appropriate;Equipment recommendations need to be updated    Co-evaluation    PT/OT/SLP Co-Evaluation/Treatment: Yes Reason for Co-Treatment: Complexity of the patient's impairments (multi-system involvement);For patient/therapist safety;To address functional/ADL transfers   OT goals addressed during session: ADL's and self-care      AM-PAC OT "6 Clicks" Daily Activity     Outcome Measure   Help from another person eating meals?: None Help from another person taking care of personal grooming?: A Little Help from another person toileting, which includes using toliet, bedpan, or urinal?: Total Help from another person bathing (including washing, rinsing, drying)?: A Lot Help from another person to put on and taking off regular upper body clothing?: A Little Help from another person to put on and taking off regular lower body clothing?: Total 6 Click Score: 14  End of Session    OT Visit Diagnosis: Unsteadiness on feet (R26.81);Pain;Muscle weakness (generalized) (M62.81);Other abnormalities of gait and mobility (R26.89);Other symptoms and signs involving the nervous system (R29.898)   Activity Tolerance Patient limited by fatigue;Patient limited by lethargy   Patient Left in bed;with call bell/phone within reach;with bed alarm set   Nurse Communication Mobility status        Time: 3403-7096 OT Time Calculation (min): 24 min  Charges: OT General Charges $OT Visit: 1 Visit OT Treatments $Self Care/Home Management : 8-22 mins  Malachy Chamber, OTR/L Acute Rehab Services Office: [phone]   Layla Maw 04/30/2021, 12:33 PM

## 2021-04-30 NOTE — TOC Transition Note (Signed)
Transition of Care [facility]) - CM/SW Discharge Note   Patient Details  Name: Sharon Lawrence MRN: [id number] Date of Birth: 02-12-1966  [facility] CM/SW Contact:  Cyndi Bender, RN Phone Number: 04/30/2021, 3:03 PM   Clinical Narrative:    Attempted to speak to patient 2 times today. First time patient was about to get a bath. Patient was trying to talk to me but would fall asleep during her sentences. The second time the patient and I tried to call her sister, [name], but the call went to voicemail. The patient tried to remember her code to open her phone but she couldn't. [name] called me back and we spoke regarding discharge plans. Holly hasn't been able to speak to Dr. Laural Golden regarding his advice on hospice for this patient.  Dr. Karleen Hampshire is aware of the above and is adjusting medications.  Final next level of care: [place] Barriers to Discharge: Continued Medical Work up   Patient Goals and CMS Choice Patient states their goals for this hospitalization and ongoing recovery are:: To get better and return home. CMS Medicare.gov Compare Post Acute Care list provided to:: Patient Choice offered to / list presented to : Patient  Discharge Placement                       Discharge Plan and Services   Discharge Planning Services: CM Consult Post Acute Care Choice: Home Health          DME Arranged: N/A DME Agency: NA       HH Arranged: PT, OT [place] Agency: [place] ([place]) Date [place]: 04/25/21 Time [place]: [place] Representative spoke with at [place]: [place]- Will Administrator, arts for approval.  Social Determinants of Health (SDOH) Interventions     Readmission Risk Interventions No flowsheet data found.

## 2021-05-01 ENCOUNTER — Inpatient Hospital Stay (HOSPITAL_COMMUNITY): Payer: Self-pay

## 2021-05-01 ENCOUNTER — Encounter (HOSPITAL_COMMUNITY): Payer: Self-pay | Admitting: Emergency Medicine

## 2021-05-01 ENCOUNTER — Other Ambulatory Visit (HOSPITAL_COMMUNITY): Payer: Self-pay

## 2021-05-01 DIAGNOSIS — R160 Hepatomegaly, not elsewhere classified: Secondary | ICD-10-CM

## 2021-05-01 DIAGNOSIS — K7460 Unspecified cirrhosis of liver: Secondary | ICD-10-CM

## 2021-05-01 DIAGNOSIS — K7682 Hepatic encephalopathy: Secondary | ICD-10-CM

## 2021-05-01 LAB — CBC WITH DIFFERENTIAL/PLATELET
Abs Immature Granulocytes: 0.44 10*3/uL — ABNORMAL HIGH (ref 0.00–0.07)
Basophils Absolute: 0.1 10*3/uL (ref 0.0–0.1)
Basophils Relative: 0 %
Eosinophils Absolute: 0 10*3/uL (ref 0.0–0.5)
Eosinophils Relative: 0 %
HCT: 22.4 % — ABNORMAL LOW (ref 36.0–46.0)
Hemoglobin: 8.1 g/dL — ABNORMAL LOW (ref 12.0–15.0)
Immature Granulocytes: 2 %
Lymphocytes Relative: 26 %
Lymphs Abs: 4.7 10*3/uL — ABNORMAL HIGH (ref 0.7–4.0)
MCH: 29.2 pg (ref 26.0–34.0)
MCHC: 36.2 g/dL — ABNORMAL HIGH (ref 30.0–36.0)
MCV: 80.9 fL (ref 80.0–100.0)
Monocytes Absolute: 1.6 10*3/uL — ABNORMAL HIGH (ref 0.1–1.0)
Monocytes Relative: 9 %
Neutro Abs: 11.4 10*3/uL — ABNORMAL HIGH (ref 1.7–7.7)
Neutrophils Relative %: 63 %
Platelets: 120 10*3/uL — ABNORMAL LOW (ref 150–400)
RBC: 2.77 MIL/uL — ABNORMAL LOW (ref 3.87–5.11)
RDW: 17.2 % — ABNORMAL HIGH (ref 11.5–15.5)
WBC: 18.2 10*3/uL — ABNORMAL HIGH (ref 4.0–10.5)
nRBC: 0.6 % — ABNORMAL HIGH (ref 0.0–0.2)

## 2021-05-01 LAB — URINALYSIS, MICROSCOPIC (REFLEX)

## 2021-05-01 LAB — AMMONIA: Ammonia: 56 umol/L — ABNORMAL HIGH (ref 9–35)

## 2021-05-01 LAB — URINALYSIS, ROUTINE W REFLEX MICROSCOPIC

## 2021-05-01 LAB — COMPREHENSIVE METABOLIC PANEL
ALT: 32 U/L (ref 0–44)
AST: 101 U/L — ABNORMAL HIGH (ref 15–41)
Albumin: 2.4 g/dL — ABNORMAL LOW (ref 3.5–5.0)
Alkaline Phosphatase: 169 U/L — ABNORMAL HIGH (ref 38–126)
Anion gap: 12 (ref 5–15)
BUN: 14 mg/dL (ref 6–20)
CO2: 18 mmol/L — ABNORMAL LOW (ref 22–32)
Calcium: 8.3 mg/dL — ABNORMAL LOW (ref 8.9–10.3)
Chloride: 103 mmol/L (ref 98–111)
Creatinine, Ser: 0.92 mg/dL (ref 0.44–1.00)
GFR, Estimated: >60 mL/min (ref >60)
Glucose, Bld: 87 mg/dL (ref 70–99)
Potassium: 3.6 mmol/L (ref 3.5–5.1)
Sodium: 133 mmol/L — ABNORMAL LOW (ref 135–145)
Total Bilirubin: 5.6 mg/dL — ABNORMAL HIGH (ref 0.3–1.2)
Total Protein: 5.2 g/dL — ABNORMAL LOW (ref 6.5–8.1)

## 2021-05-01 LAB — PROCALCITONIN: Procalcitonin: 0.59 ng/mL

## 2021-05-01 MED ORDER — ONDANSETRON HCL 4 MG/2ML IJ SOLN
4.0000 mg | Freq: Three times a day (TID) | INTRAMUSCULAR | Status: AC
  Administered 2021-05-01 – 2021-05-03 (×6): 4 mg via INTRAVENOUS
  Filled 2021-05-01 (×6): qty 2

## 2021-05-01 MED ORDER — LACTATED RINGERS IV BOLUS
1000.0000 mL | Freq: Once | INTRAVENOUS | Status: AC
  Administered 2021-05-01: 1000 mL via INTRAVENOUS

## 2021-05-01 MED ORDER — ONDANSETRON HCL 4 MG/2ML IJ SOLN: 4.0000 mg | Freq: Three times a day (TID) | INTRAMUSCULAR | Status: DC

## 2021-05-01 MED ORDER — RIFAXIMIN 200 MG PO TABS
200.0000 mg | ORAL_TABLET | Freq: Two times a day (BID) | ORAL | Status: DC
  Filled 2021-05-01 (×2): qty 1

## 2021-05-01 MED ORDER — KETOROLAC TROMETHAMINE 15 MG/ML IJ SOLN
15.0000 mg | Freq: Two times a day (BID) | INTRAMUSCULAR | Status: DC | PRN
  Administered 2021-05-01: 15 mg via INTRAVENOUS
  Filled 2021-05-01: qty 1

## 2021-05-01 MED ORDER — RIFAXIMIN 550 MG PO TABS
550.0000 mg | ORAL_TABLET | Freq: Two times a day (BID) | ORAL | Status: DC
Start: 2021-05-01 — End: 2021-05-11
  Administered 2021-05-01 – 2021-05-10 (×18): 550 mg via ORAL
  Filled 2021-05-01 (×20): qty 1

## 2021-05-01 MED ORDER — VITAMIN K1 10 MG/ML IJ SOLN
5.0000 mg | Freq: Every day | INTRAVENOUS | Status: AC
  Administered 2021-05-01 – 2021-05-03 (×3): 5 mg via INTRAVENOUS
  Filled 2021-05-01 (×3): qty 0.5

## 2021-05-01 MED ORDER — KETOROLAC TROMETHAMINE 15 MG/ML IJ SOLN
15.0000 mg | Freq: Once | INTRAMUSCULAR | Status: AC
  Administered 2021-05-01: 15 mg via INTRAVENOUS
  Filled 2021-05-01: qty 1

## 2021-05-01 MED ORDER — LACTATED RINGERS IV SOLN: INTRAVENOUS | Status: DC

## 2021-05-01 NOTE — Progress Notes (Signed)
°   05/01/21 0205  Assess: MEWS Score  BP (!) 93/55  Pulse Rate 89  ECG Heart Rate 89  Resp 12  SpO2 96 %  O2 Device Room Air  Assess: MEWS Score  MEWS Temp 0  MEWS Systolic 1  MEWS Pulse 0  MEWS RR 1  MEWS LOC 0  MEWS Score 2  MEWS Score Color Yellow  Assess: if the MEWS score is Yellow or Red  Were vital signs taken at a resting state? No  Focused Assessment No change from prior assessment  Early Detection of Sepsis Score *See Row Information* Low  MEWS guidelines implemented *See Row Information* Yes  Take Vital Signs  Increase Vital Sign Frequency  Red: Q 1hr X 4 then Q 4hr X 4, if remains red, continue Q 4hrs  Escalate  MEWS: Escalate Yellow: discuss with charge nurse/RN and consider discussing with provider and RRT  Notify: Provider  Provider Name/Title Inda Merlin MD  Date Provider Notified 05/01/21  Time Provider Notified 0205  Notification Type  (Secure Message)  Notification Reason Other (Comment) (SBP)  Provider response No new orders;Other (Comment) (per provider continue to monitor maintain SBP > 90, and Map >65)  Date of Provider Response 05/01/21  Time of Provider Response [phone]

## 2021-05-01 NOTE — Progress Notes (Addendum)
Daily Rounding Note  05/01/2021, 12:57 PM  LOS: 9 days   SUBJECTIVE:   Chief complaint:   Decompensated cirrhosis  Dr Karleen Hampshire requesting GI assessment of prognosis to help pt/family make [place] decisions.    With multiple issues including chronic anemia.  Chronic abdominal pain, nausea, anorexia, weight loss.  . GI issues include cirrhosis, likely due to NASH.  Though prior hx ETOH abuse.  Celiac disease confirmed on EGD biopsies 07/2020.  Gastroparesis?,  Reglan discontinued due to tremors.  Never completed gastric emptying study.  Ascites.  No SBP on 1.2 L paracentesis 2/21.  CTAP 10/22 with hepatomegaly, hepatic steatosis, hypodensities in the liver.  MRI never completed until this current admission.  MRI liver 2/22 with lesions suspicious for hepatomas, underlying cirrhosis but study limited by respiratory and body habitus artifact.  Alpha-fetoprotein level normal at 149 on 04/24/21.  Ultrasound abdomen of 2/26 showed small ascites, insufficient for paracentesis.  Several loops of fluid filled small bowel.  See GI consult 2/21, GI sign off on 2/23.  Evaluated for MRI findings, acute on chonic nausea, vomiting, diarrhea, abdominal pain in setting of hypotension.  Urine clx + for yeast, treated w Diflucan.  Shock, lactic acidosis, sepsis quiring vasopressors improved.  Hgb 6.3 .Marland Kitchen  2 PRBCs >> 9.   Dr. Lyndel Safe recommended either liver biopsy versus repeat MRI in 3 months.  Patient chose option #2, she expressed very clearly she did not want to undergo biopsy.  Plan was to follow-up.  Dr. Lyndel Safe signed off.  Last night patient was acutely agitated, tremulous and blood pressure 64/55.  Rapid response placed patient in Trendelenburg with blood pressure improved to 123/88.  Appeared dry on exam bolused with 1 LR.  Recurrent hypotension to 93/55 with heart rate in 80s at 2 AM this morning. Patient's sister has spoken with Dr. Laural Golden today.  He spoke to  Dr Karleen Hampshire as well.  He is deferring current opinion regarding patient's liver disease to local GI, in this case Dr. Arelia Longest.  The sister says she has not spoken with GI locally though the patient was seen a few times in recent days.  She has ongoing failure to thrive.  Is not eating.  Confused, weak.  Despite lactulose is not having significant bowel movements.  Had a large stool yesterday but that was the first in a few days.  Family unable to afford rifaximin Head CT ordered.  Palliative care goals of care consult apparently planned but not completed.  Attending MD and family seeking guidance as to overall prognosis to help them make to decisions.  Latest labs today with sodium 133, stable.  Normal BUN, creatinine, potassium. LFTs rising though not markedly elevated with improved T. bili. T. bili 8.6..  5.6.  Alk phos 116..  169.  AST/ALT 48/16..  101/32.  Lipase 16.   Hgb 8.1 Ammonia 82 .Marland Kitchen  56   OBJECTIVE:         Vital signs in last 24 hours:    Temp:  [97.4 F (36.3 C)-98.5 F (36.9 C)] 97.5 F (36.4 C) (02/28 1222) Pulse Rate:  [81-125] 81 (02/28 1222) Resp:  [11-25] 12 (02/28 1222) BP: (64-125)/(54-88) 84/56 (02/28 1222) SpO2:  [94 %-97 %] 95 % (02/28 1222) Last BM Date : 04/30/21 Filed Weights   04/22/21 1410  Weight: 82.3 kg   General: Patient is confused, looks severely chronically as well as acutely ill.  She is jaundiced.  Somnolent but arousable  Heart: RRR. Chest: No labored breathing or cough.  Lungs clear in front. Abdomen: Soft.  Minimal nonfocal tenderness without guarding or rebound.  No fluid wave.  Do not appreciate organomegaly, bruits or hernias. Extremities: Anasarca at least to the knees with pitting. Neuro/Psych: Symmetric upper extremity weakness.  Hardly able to hold her hands up in the air.  4/5 strength grossly in both arms.  No tremors.  Unable to elicit asterixis.  Intake/Output from previous day: 02/27 0701 - 02/28 0700 In: 240 [P.O.:240] Out: -    Intake/Output this shift: No intake/output data recorded.  Lab Results: Recent Labs    04/29/21 0242 05/01/21 0502  WBC 13.8* 18.2*  HGB 9.2* 8.1*  HCT 25.6* 22.4*  PLT 110* 120*   BMET Recent Labs    04/29/21 0242 04/30/21 1605 05/01/21 0502  NA 134* 133* 133*  K 3.7 3.5 3.6  CL 104 102 103  CO2 20* 21* 18*  GLUCOSE 119* 96 87  BUN 19 16 14   CREATININE 0.81 0.75 0.92  CALCIUM 8.3* 8.2* 8.3*   LFT Recent Labs    04/29/21 0242 05/01/21 0502  PROT 5.1* 5.2*  ALBUMIN 2.5* 2.4*  AST 55* 101*  ALT 17 32  ALKPHOS 120 169*  BILITOT 6.6* 5.6*   PT/INR No results for input(s): LABPROT, INR in the last 72 hours. Hepatitis Panel No results for input(s): HEPBSAG, HCVAB, HEPAIGM, HEPBIGM in the last 72 hours.  Studies/Results: No results found.  Scheduled Meds:  feeding supplement  1 Container Oral TID BM   lactulose  30 g Oral TID   mouth rinse  15 mL Mouth Rinse BID   midodrine  15 mg Oral TID WC   multivitamin with minerals  1 tablet Oral Daily   ondansetron (ZOFRAN) IV  4 mg Intravenous Q8H   pantoprazole  40 mg Oral QHS   rifaximin  550 mg Oral BID   sertraline  25 mg Oral QPC breakfast   Continuous Infusions:  sodium chloride Stopped (04/23/21 0504)   lactated ringers 75 mL/hr at 05/01/21 1351   PRN Meds:.sodium chloride, acetaminophen, ketorolac, sodium chloride   ASSESMENT:      Decompensated cirrhosis (NASH, +/- prior ETOH) .  T bili improved but alk phos, AST rising.     Liver lesions suspicious for hepatomas, AFP not elevated.  Pt declined biopsy.  Plan is MRI fup in late May.      Acute on chronic anemia.  Microcytic a few d  ago.    Ascites.  No SBP on fluid studies 2/20.  Thrombocytopenia.  Overall improved, noncritical.  Sepsis.  Funguria, treated with Diflucan x 1 tablet on 1025.  WBCs rising, 18.2 today.    AMS with elevated ammonia.  Though ammonia is declining, mental status not improving.  Receiving lactulose but not reaching  goal of BMs 3 times daily.  Rifaxman added today but dose needs to be higher.  Patient lacks medical insurance and cannot afford rifaximin.  Unclear if anyone has applied for patient assistance for this medication.  CT head ordered.      Coagulopathy.      Hyponatremia.      FTT.    PLAN   Await head CT.  Increased Rifaximin to 550 bid.  Look into pt assistant for rifaximin.  Add lactulose enemas?   Prognosis for liver disease is hindered by not being able to perform liver biopsy.  If patient qualifies for home hospice, would encourage family to take advantage of  this as they provide excellent, single point of contact services    Vitamin K 5 mg IV x 3 days. PT INr and cmet in AM.      Azucena Freed  05/01/2021, 12:57 PM Phone [phone]     [facility] Attending   MELD = 24 Childs score 12 pts class C  So prognosis very poor and if she has hepatocellular carcinoma even worse. I doubt she will live through the year but obviously do not know for sure.  I do not have a clear answer as to hypotension unless it was dehydration - poor po intake, stool losses??  Spoke to patient, father and sister.  Patient is willing to undergo a liver biopsy now and I think that would help prognosticate if we can get that done - coags may preclude.  Lack of insurance a major issue also.  Hope this is helpful.   Gatha Mayer, MD, [facility] 05/01/2021 5:05 PM

## 2021-05-01 NOTE — Plan of Care (Signed)

## 2021-05-01 NOTE — Progress Notes (Signed)
PROGRESS NOTE    Sharon Lawrence  MEQ:[id number] DOB: [birth year] DOA: 04/22/2021 PCP: Lindell Spar, MD   Chief Complaint  Patient presents with   Weakness    Brief Narrative:  56 year old with prior history of coronary artery disease, STEMI in 2019, celiac disease, [name], GI bleed, chronic pain syndrome, liver cirrhosis who presented with generalized weakness, nausea, vomiting and abdominal pain, admitted with undifferentiated shock septic/hypovolemic. Patient came off of vasopressors, SBP was ruled out with negative peritoneal fluid studies. Urine cultures show yeast. She was given a dose of diflucan in addition to completing the course of rocephin.  She underwent MRI Liver showing cirrhosis, hematomas and moderate ascites and anasarca. Liver biopsy was offered to the patient but she refused. She has progressively become weaker , with decreased oral intake, encephalopathic from hyperammoniemia. . Overnight patient became hypotensive and altered and was given a liter of RL. Her pressures improved with IV fluids, but she remains lethargic despite fluid resuscitation. CT head without contrast ordered for further evaluation.   Assessment & Plan:   Principal Problem:   Acute sepsis ([place]) Active Problems:   Benign essential hypertension   Hypokalemia   Thrombocytopenia (HCC)   Pressure injury of skin   Moderate episode of recurrent major depressive disorder (HCC)   Malnutrition of moderate degree   Liver failure (HCC)   Ascites   Shock Differential includes sepsis versus hypovolemic shock. Urine cultures growing yeast. One dose of diflucan given.  Peritoneal fluid not consistent with SBP. Patient was weaned off vasopressors. Completed the course of IV Rocephin.   Suspected decompensated cirrhosis with ascites with a history of NAFLD So far work-up is negative for autoimmune hepatitis and viral hepatitis.  Progression of cirrhosis in the last few months. Evidence of portal hypertension  is seen. Liver MRI showed Hepatic lesions are difficult to accurately characterize and to definitively use LI-RADS criteria but suspicious for hepatomas. Moderate volume abdominal ascites and diffuse body wall edema. GI recommended liver biopsy, but she refused biopsy, agreed for a repeat MRI in 3 months.  AFP negative, mitochondrial antibody is negative. Alpha antitrypsin is 149.  Lactulose increased to target about 2-3 bowel movements per day. Added rifaximin today.  Liver panel showed alk phos of 169 and bilirubin of 5.2.    Elevated BNP and anasarca probably secondary to hypoalbuminemia secondary to cirrhosis. Echocardiogram shows hyperdynamic left ventricular ejection fraction. MRI liver shows hepatic lesions and cirrhosis,  in addition to  Moderate volume abdominal ascites and diffuse body wall edema. Unable to resume home torsemide due to low BP parameters.  US abdomen for paracentesis ordered, did not show enough fluid for paracentesis.    Chronic bilateral lower extremity weakness Therapy evaluations ordered. Home health PT/OT. She needs full supervision.  Hospital bed and lift ordered.    Acute metabolic encephalopathy/ acute hepatic encephalopathy:  Pt did not have a BM over the weekend and her ammonia level on 2/27 was 82. Lactulose was increased to 30 TID, with one BM yesterday. Her ammonia is 56 today. But she remains lethargic.  Will get CT head without contrast for further evaluation. No focal deficits, she is arousal to verbal cues and oriented to person and place only. She is able to answer simple questions but goes back to sleep.    Acute on chronic microcytic anemia and acute thrombocytopenia in the setting of liver cirrhosis and history of GI bleed in 2019 due to Brilinta S/p 2 units of prbc transfusion , hemoglobin improved  to 9.2 post transfusion. It dropped to 8.1. Transfuse to keep hemoglobin greater than 7. Monitor for signs of bleeding. Acute thrombocytopenia  probably secondary to decompensated cirrhosis Platelets have improved to 120000.    History of coronary artery disease, STEMI s/p PCI to RCA Patient currently denies any chest pain or shortness of breath.    History of celiac disease Continue with gluten-free diet   Unintentional weight loss Outpatient follow-up with PCP for appropriate cancer screening.   Pressure Injury  present on admission: Pressure Injury 04/22/21 Sacrum Right;Mid Stage 2 -  Partial thickness loss of dermis presenting as a shallow open injury with a red, pink wound bed without slough. Pink wound bed (Active)  04/22/21 2145  Location: Sacrum  Location Orientation: Right;Mid  Staging: Stage 2 -  Partial thickness loss of dermis presenting as a shallow open injury with a red, pink wound bed without slough.  Wound Description (Comments): Pink wound bed  Present on Admission: Yes   Continue with local wound care.    Leukocytosis:  Unclear etiology. CXR shows atelectasis. UA ordered.  No fever, . Pro calcitonin ordered.    Hypokalemia Replaced.   Depression/Failure to thrive/ Moderate Malnutrition:  Flat affect, as per caregiver/ sister she is non compliant to meds and gluten free diet. Does not take care of herself.  Reports being depressed, and has refused to be put on anti depression meds in the past.  Psychiatry consulted. Will need to evaluate the patient to see if she has insight into her medical issues.  In view of her recurrent admissions, liver cirrhosis, non compliant to meds, diet, and care, palliative care consulted for goals of care discussions.  Psychiatry evaluated and started her on zoloft. Appreciate their help.  Family requesting to speak to on call GI physician and get prognosis.     DVT prophylaxis: scd's Code Status: DNR Family Communication: father at bedside, discussed with sister [name] over the phone.  Disposition:   Status is: Inpatient Remains inpatient appropriate  because: hepatic encephalopathy.  As per palliative care, Sister [name] cannot care for Shavette at home. As per PT  She needed 2 + assistance.       Consultants:  Gastroenterology.  Palliative care Psychiatry.   Procedures: MRI liver.   Antimicrobials:  Antibiotics Given (last 72 hours)     None        Subjective: Opens eyes to verbal cues, answering one word answers. Reports nausea is better with zofran. No abdominal pain.    Objective: Vitals:   05/01/21 0631 05/01/21 0704 05/01/21 0759 05/01/21 1222  BP: (!) 90/55 (!) 90/54 (!) 91/54 (!) 84/56  Pulse: 92 86 88 81  Resp: 12 11 12 12   Temp:   (!) 97.4 F (36.3 C) (!) 97.5 F (36.4 C)  TempSrc:   Oral Oral  SpO2: 96% 97% 94% 95%  Weight:      Height:       No intake or output data in the 24 hours ending 05/01/21 1356  Filed Weights   04/22/21 1410  Weight: 82.3 kg    Examination:  General exam: Ill appearing lady, Jaundiced, deconditioned, lethargic.  Respiratory system: Clear to auscultation. Respiratory effort normal. Cardiovascular system: S1 & S2 heard, RRR. No JVD, anasaraca.  Gastrointestinal system: Abdomen is mildly distended, no tenderness, soft, bs+ Central nervous system: opens eyes to verbal cues. Answers one word answers. Spontaneously moves all extremities. Speech is slow but no slurring.  Extremities: pedal edema.  Skin: bruising  over the upper extremities.  Psychiatry: flat affect       Data Reviewed: I have personally reviewed following labs and imaging studies  CBC: Recent Labs  Lab 04/26/21 0212 04/27/21 0438 04/28/21 0101 04/29/21 0242 05/01/21 0502  WBC 12.1* 11.0* 14.0* 13.8* 18.2*  NEUTROABS 8.3* 7.7 9.8* 8.9* 11.4*  HGB 7.5* 7.2* 9.2* 9.2* 8.1*  HCT 20.8* 20.7* 25.8* 25.6* 22.4*  MCV 80.6 81.8 79.9* 79.3* 80.9  PLT 93* 98* 97* 110* 120*     Basic Metabolic Panel: Recent Labs  Lab 04/27/21 0438 04/28/21 0101 04/29/21 0242 04/30/21 1605 05/01/21 0502  NA 134*  136 134* 133* 133*  K 3.8 3.9 3.7 3.5 3.6  CL 104 105 104 102 103  CO2 21* 18* 20* 21* 18*  GLUCOSE 102* 111* 119* 96 87  BUN 13 16 19 16 14   CREATININE 0.74 0.83 0.81 0.75 0.92  CALCIUM 8.0* 8.2* 8.3* 8.2* 8.3*     GFR: Estimated Creatinine Clearance: 67.2 mL/min (by C-G formula based on SCr of 0.92 mg/dL).  Liver Function Tests: Recent Labs  Lab 04/26/21 0212 04/27/21 0438 04/28/21 0101 04/29/21 0242 05/01/21 0502  AST 49* 52* 48* 55* 101*  ALT 14 16 16 17  32  ALKPHOS 103 109 116 120 169*  BILITOT 6.5* 6.7* 8.6* 6.6* 5.6*  PROT 4.8* 4.9* 5.1* 5.1* 5.2*  ALBUMIN 2.3* 2.3* 2.4* 2.5* 2.4*     CBG: Recent Labs  Lab 04/24/21 1946 04/30/21 2013  GLUCAP 91 137*      Recent Results (from the past 240 hour(s))  Urine Culture     Status: Abnormal   Collection Time: 04/22/21  2:13 PM   Specimen: Urine, Catheterized  Result Value Ref Range Status   Specimen Description URINE, CATHETERIZED  Final   Special Requests   Final    NONE Performed at [facility], [place] [address] [place], [place] [address]    Culture >=100,000 COLONIES/mL YEAST (A)  Final   Report Status 04/24/2021 FINAL  Final  Resp Panel by RT-PCR (Flu A&B, Covid) Nasopharyngeal Swab     Status: None   Collection Time: 04/22/21  7:36 PM   Specimen: Nasopharyngeal Swab; Nasopharyngeal(NP) swabs in vial transport medium  Result Value Ref Range Status   SARS Coronavirus 2 by RT PCR NEGATIVE NEGATIVE Final    Comment: (NOTE) SARS-CoV-2 target nucleic acids are NOT DETECTED.  The SARS-CoV-2 RNA is generally detectable in upper respiratory specimens during the acute phase of infection. The lowest concentration of SARS-CoV-2 viral copies this assay can detect is 138 copies/mL. A negative result does not preclude SARS-Cov-2 infection and should not be used as the sole basis for treatment or other patient management decisions. A negative result may occur with  improper specimen collection/handling,  submission of specimen other than nasopharyngeal swab, presence of viral mutation(s) within the areas targeted by this assay, and inadequate number of viral copies(<138 copies/mL). A negative result must be combined with clinical observations, patient history, and epidemiological information. The expected result is Negative.  Fact Sheet for Patients:  EntrepreneurPulse.com.au  Fact Sheet for Healthcare Providers:  IncredibleEmployment.be  This test is no t yet approved or cleared by the [place] FDA and  has been authorized for detection and/or diagnosis of SARS-CoV-2 by FDA under an Emergency Use Authorization (EUA). This EUA will remain  in effect (meaning this test can be used) for the duration of the COVID-19 declaration under Section 564(b)(1) of the Act, 21 U.S.C.section 360bbb-3(b)(1), unless  the authorization is terminated  or revoked sooner.       Influenza A by PCR NEGATIVE NEGATIVE Final   Influenza B by PCR NEGATIVE NEGATIVE Final    Comment: (NOTE) The Xpert Xpress SARS-CoV-2/FLU/RSV plus assay is intended as an aid in the diagnosis of influenza from Nasopharyngeal swab specimens and should not be used as a sole basis for treatment. Nasal washings and aspirates are unacceptable for Xpert Xpress SARS-CoV-2/FLU/RSV testing.  Fact Sheet for Patients: EntrepreneurPulse.com.au  Fact Sheet for Healthcare Providers: IncredibleEmployment.be  This test is not yet approved or cleared by the [place] FDA and has been authorized for detection and/or diagnosis of SARS-CoV-2 by FDA under an Emergency Use Authorization (EUA). This EUA will remain in effect (meaning this test can be used) for the duration of the COVID-19 declaration under Section 564(b)(1) of the Act, 21 U.S.C. section 360bbb-3(b)(1), unless the authorization is terminated or revoked.  Performed at [facility], [address] [place], [place] [address]   MRSA Next Gen by PCR, Nasal     Status: None   Collection Time: 04/22/21  9:38 PM   Specimen: Nasal Mucosa; Nasal Swab  Result Value Ref Range Status   MRSA by PCR Next Gen NOT DETECTED NOT DETECTED Final    Comment: (NOTE) The GeneXpert MRSA Assay (FDA approved for NASAL specimens only), is one component of a comprehensive MRSA colonization surveillance program. It is not intended to diagnose MRSA infection nor to guide or monitor treatment for MRSA infections. Test performance is not FDA approved in patients less than 4 years old. Performed at [facility], [place] [address], [place], [place] [address]   Culture, blood (routine x 2)     Status: None   Collection Time: 04/23/21 12:11 PM   Specimen: BLOOD  Result Value Ref Range Status   Specimen Description BLOOD SITE NOT SPECIFIED  Final   Special Requests   Final    BOTTLES DRAWN AEROBIC AND ANAEROBIC Blood Culture adequate volume   Culture   Final    NO GROWTH 5 DAYS Performed at [facility], [place] [address] [place], [place] [address]    Report Status 04/28/2021 FINAL  Final  Culture, blood (routine x 2)     Status: None   Collection Time: 04/23/21 12:35 PM   Specimen: BLOOD  Result Value Ref Range Status   Specimen Description BLOOD SITE NOT SPECIFIED  Final   Special Requests   Final    BOTTLES DRAWN AEROBIC AND ANAEROBIC Blood Culture adequate volume   Culture   Final    NO GROWTH 5 DAYS Performed at [facility], [place] [address] [place], [place] [address]    Report Status 04/28/2021 FINAL  Final  Peritoneal fluid culture w Gram Stain     Status: None   Collection Time: 04/23/21  4:49 PM   Specimen: Peritoneal Washings; Peritoneal Fluid  Result Value Ref Range Status   Specimen Description PERITONEAL  Final   Special Requests NONE  Final   Gram Stain   Final    FEW WBC PRESENT, PREDOMINANTLY MONONUCLEAR NO ORGANISMS SEEN    Culture   Final    NO GROWTH 3  DAYS Performed at [facility], [address] [place], [place] [address]    Report Status 04/26/2021 FINAL  Final          Radiology Studies: No results found.      Scheduled Meds:  feeding supplement  1 Container Oral TID BM   lactulose  30 g Oral TID   mouth rinse  15 mL Mouth Rinse BID   midodrine  15 mg Oral TID WC   multivitamin with minerals  1 tablet Oral Daily   ondansetron (ZOFRAN) IV  4 mg Intravenous Q8H   pantoprazole  40 mg Oral QHS   rifaximin  200 mg Oral BID   sertraline  25 mg Oral QPC breakfast   Continuous Infusions:  sodium chloride Stopped (04/23/21 0504)   lactated ringers 75 mL/hr at 05/01/21 1351     LOS: 9 days        Hosie Poisson, MD [facility]   To contact the attending provider between 7A-7P or the covering provider during after hours 7P-7A, please log into the web site [email or web address] and access using universal [place] password for that web site. If you do not have the password, please call the hospital operator.  05/01/2021, 1:56 PM

## 2021-05-02 LAB — COMPREHENSIVE METABOLIC PANEL
ALT: 31 U/L (ref 0–44)
AST: 90 U/L — ABNORMAL HIGH (ref 15–41)
Albumin: 2 g/dL — ABNORMAL LOW (ref 3.5–5.0)
Alkaline Phosphatase: 148 U/L — ABNORMAL HIGH (ref 38–126)
Anion gap: 9 (ref 5–15)
BUN: 15 mg/dL (ref 6–20)
CO2: 20 mmol/L — ABNORMAL LOW (ref 22–32)
Calcium: 8 mg/dL — ABNORMAL LOW (ref 8.9–10.3)
Chloride: 102 mmol/L (ref 98–111)
Creatinine, Ser: 0.98 mg/dL (ref 0.44–1.00)
GFR, Estimated: >60 mL/min (ref >60)
Glucose, Bld: 95 mg/dL (ref 70–99)
Potassium: 3.2 mmol/L — ABNORMAL LOW (ref 3.5–5.1)
Sodium: 131 mmol/L — ABNORMAL LOW (ref 135–145)
Total Bilirubin: 4.8 mg/dL — ABNORMAL HIGH (ref 0.3–1.2)
Total Protein: 4.7 g/dL — ABNORMAL LOW (ref 6.5–8.1)

## 2021-05-02 LAB — PROCALCITONIN: Procalcitonin: 0.81 ng/mL

## 2021-05-02 LAB — PROTIME-INR
INR: 1.8 — ABNORMAL HIGH (ref 0.8–1.2)
Prothrombin Time: 20.5 seconds — ABNORMAL HIGH (ref 11.4–15.2)

## 2021-05-02 LAB — APTT: aPTT: 41 seconds — ABNORMAL HIGH (ref 24–36)

## 2021-05-02 MED ORDER — POTASSIUM CHLORIDE CRYS ER 20 MEQ PO TBCR
40.0000 meq | EXTENDED_RELEASE_TABLET | Freq: Two times a day (BID) | ORAL | Status: AC
  Administered 2021-05-02 – 2021-05-03 (×4): 40 meq via ORAL
  Filled 2021-05-02 (×4): qty 2

## 2021-05-02 MED ORDER — PREGABALIN 25 MG PO CAPS
25.0000 mg | ORAL_CAPSULE | Freq: Two times a day (BID) | ORAL | Status: DC
  Administered 2021-05-02 – 2021-05-05 (×8): 25 mg via ORAL
  Filled 2021-05-02 (×8): qty 1

## 2021-05-02 MED ORDER — KETOROLAC TROMETHAMINE 15 MG/ML IJ SOLN
15.0000 mg | Freq: Two times a day (BID) | INTRAMUSCULAR | Status: AC | PRN
  Administered 2021-05-02: 15 mg via INTRAVENOUS
  Filled 2021-05-02 (×2): qty 1

## 2021-05-02 NOTE — Progress Notes (Signed)
Physical Therapy Treatment ?Patient Details ?Name: Sharon Lawrence ?MRN: [id number] ?DOB: [birth year] ?Today's Date: 05/02/2021 ? ? ?History of Present Illness Pt presented to [facility] for nausea and vomiting on 2/19. Pt with septic shock and UTI. PMH - STEMI, ADHD, anxiety, chronic pain, CAD, Celiac disease, ? ?  ?PT Comments  ? ? Pt admitted with above diagnosis. Pt only able to tolerate 8 min at EOB prior to losing balance and also with hypotension with position changes.  Pt has very poor trunk control and needed mod to max assist to sit EOB with very little functional movement of Ues to assist with sitting. Pt making slow progress and is limited by poor endurance, poor postural stability, hypotension and weakness. Will continue to progress pt as able.  Pt will benefit from skilled PT to increase their independence and safety with mobility to allow discharge to the venue listed below.      ?Recommendations for follow up therapy are one component of a multi-disciplinary discharge planning process, led by the attending physician.  Recommendations may be updated based on patient status, additional functional criteria and insurance authorization. ? ?Follow Up Recommendations ? Home health PT ?  ?  ?Assistance Recommended at Discharge Frequent or constant Supervision/Assistance  ?Patient can return home with the following Two people to help with walking and/or transfers ?  ?Equipment Recommendations ? Other (comment) (hoyer lift)  ?  ?Recommendations for Other Services   ? ? ?  ?Precautions / Restrictions Precautions ?Precautions: Fall ?Precaution Comments: watch BP ?Restrictions ?Weight Bearing Restrictions: No  ?  ? ?Mobility ? Bed Mobility ?Overal bed mobility: Needs Assistance ?Bed Mobility: Supine to Sit, Sit to Supine ?  ?  ?Supine to sit: Max assist, HOB elevated ?Sit to supine: Total assist ?  ?General bed mobility comments: able to assist in moving B LE to EOB, cues to use bedrail but unable to hold bedrail due to ataxic  movements, heavy assist to lift trunk. Total A x 2 to get  back to bed with poor extremity control ?  ? ?Transfers ?  ?  ?  ?  ?  ?  ?  ?  ?  ?General transfer comment: unable as pt needed assist just to sit eOB. ?  ? ?Ambulation/Gait ?  ?  ?  ?  ?  ?  ?  ?General Gait Details: Unable ? ? ?Stairs ?  ?  ?  ?  ?  ? ? ?Wheelchair Mobility ?  ? ?Modified Rankin (Stroke Patients Only) ?  ? ? ?  ?Balance Overall balance assessment: Needs assistance ?Sitting-balance support: Bilateral upper extremity supported, Feet supported ?Sitting balance-Leahy Scale: Poor ?Sitting balance - Comments: Mod A x to Max A to maintain static sitting balance. Intermittent involuntary jerking movements noted causing pt to lose balance as well.  Pt having difficulty using her UEs functionally due to incoordination of UE movement.  Pt lossing balance posterior and to right > left but at times losing balance all directions. ?Postural control: Right lateral lean, Posterior lean ?  ?  ?  ?  ?  ?  ?  ?  ?  ?  ?  ?  ?  ?  ?  ? ?  ?Cognition Arousal/Alertness: Awake/alert, Lethargic ?Behavior During Therapy: Flat affect ?Overall Cognitive Status: Impaired/Different from baseline ?Area of Impairment: Following commands, Safety/judgement, Awareness, Problem solving ?  ?  ?  ?  ?  ?  ?  ?  ?  ?  ?  ?  Following Commands: Follows one step commands with increased time, Follows one step commands consistently ?Safety/Judgement: Decreased awareness of deficits ?Awareness: Emergent ?Problem Solving: Slow processing, Requires verbal cues, Difficulty sequencing ?General Comments: pleasant, slow processing and intermittent lethargy/keeping eyes closed. Follows directions consistently ?  ?  ? ?  ?Exercises General Exercises - Upper Extremity ?Shoulder Flexion: AAROM, Both, 10 reps, Supine ?Shoulder Extension: AAROM, Both, 10 reps, Supine ?Elbow Flexion: AAROM, Both, 10 reps, Supine ?Elbow Extension: AAROM, Both, 10 reps, Supine ?General Exercises - Lower  Extremity ?Heel Slides: AAROM, Both, 10 reps, Supine ?Straight Leg Raises: AAROM, Both, 5 reps, Supine ? ?  ?General Comments General comments (skin integrity, edema, etc.): HR 90 bpm, 99%RA, 98/61; Sitting 95 bpm, 109/71; BP after sitting 8 min, 87/67 and HR 95 bpm.  Once pt back in supine BP 103/77. ?  ?  ? ?Pertinent Vitals/Pain Pain Assessment ?Pain Assessment: Faces ?Faces Pain Scale: Hurts a little bit ?Pain Location: back ?Pain Descriptors / Indicators: Grimacing ?Pain Intervention(s): Limited activity within patient's tolerance, Monitored during session, Repositioned  ? ? ?Home Living   ?  ?  ?  ?  ?  ?  ?  ?  ?  ?   ?  ?Prior Function    ?  ?  ?   ? ?PT Goals (current goals can now be found in the care plan section) Acute Rehab PT Goals ?Patient Stated Goal: home ?Progress towards PT goals: Progressing toward goals ? ?  ?Frequency ? ? ? Min 3X/week ? ? ? ?  ?PT Plan Current plan remains appropriate  ? ? ?Co-evaluation   ?  ?  ?  ?  ? ?  ?AM-PAC PT "6 Clicks" Mobility   ?Outcome Measure ? Help needed turning from your back to your side while in a flat bed without using bedrails?: A Lot ?Help needed moving from lying on your back to sitting on the side of a flat bed without using bedrails?: Total ?Help needed moving to and from a bed to a chair (including a wheelchair)?: Total ?Help needed standing up from a chair using your arms (e.g., wheelchair or bedside chair)?: Total ?Help needed to walk in hospital room?: Total ?Help needed climbing 3-5 steps with a railing? : Total ?6 Click Score: 7 ? ?  ?End of Session Equipment Utilized During Treatment: Gait belt ?Activity Tolerance: Patient limited by fatigue ?Patient left: in bed;with call bell/phone within reach;with bed alarm set;with family/visitor present (father in room) ?Nurse Communication: Mobility status;Need for lift equipment ?PT Visit Diagnosis: Unsteadiness on feet (R26.81);Other abnormalities of gait and mobility (R26.89);History of falling  (Z91.81) ?  ? ? ?Time: 7096-2836 ?PT Time Calculation (min) (ACUTE ONLY): 18 min ? ?Charges:  $Therapeutic Activity: 8-22 mins          ?          ? ?Dallen Bunte M,PT ?Acute Rehab Services ?[phone] ?[phone] (pager)  ? ? ?Alvira Philips ?05/02/2021, 1:52 PM ? ?

## 2021-05-02 NOTE — Progress Notes (Signed)
PROGRESS NOTE    Sharon Lawrence  HTD:[id number] DOB: 11-12-1965 DOA: 04/22/2021 PCP: Lindell Spar, MD    Brief Narrative:  56 year old with history of coronary artery disease, STEMI in 2019, celiac disease, [name] cirrhosis and GI bleeding, chronic pain syndrome presented with generalized weakness, nausea vomiting and abdominal pain.  She was admitted to intensive care unit with initial low blood pressures on vasopressors.  SBP was ruled out with negative peritoneal fluid studies.  Urine culture showed yeast.  Underwent MRI of the liver that showed cirrhosis, hematomas and moderate ascites with anasarca.  Patient had extensive weakness, failure to thrive, decreased oral intake and encephalopathy from hyperammonemia.  She had episodes of hypotension responding to IV fluids.  Remains in the hospital due to altered mental status, further medical work-up.   Assessment & Plan:   Shock: Likely hypovolemic shock.  Urine cultures with yeast, treated with Diflucan.  Peritoneal fluid negative.  Weaned off vasopressors.  Completed course of IV Rocephin.  Blood pressures adequate now. She is expected to have low normal blood pressure, on midodrine.  Decompensated cirrhosis with ascites and history of [place]: Metabolic encephalopathy. Work-up negative for autoimmune hepatitis and viral hepatitis.  Progressive cirrhosis and portal hypertension. MRI of the liver consistent with hepatic lesions, seen by interventional radiology and plan for liver biopsy including biopsy of the mass and core liver biopsy tomorrow. Remains on lactulose, had 4 bowel movements last 24 hours with improvement of mentation. Remains on rifaximin.  Acute on chronic microcytic anemia, acute thrombocytopenia in the setting of liver cirrhosis and history of GI bleeding: Received 2 units of PRBC with improvement of hemoglobin.  Platelets are stable. Patient currently remains on vitamin K injection x3 days INR 1.8.  Hypokalemia:  Replaced.  Failure to thrive/moderate malnutrition/advanced liver disease: With multiple issues and advanced liver disease, patient has poor prognosis and outcome. She has been bedbound with poor mobility for long time now. Seen by gastroenterology, prognosis is poor.  Family wanting to have a diagnosis before deciding palliation. Planning for liver biopsy including liver mass biopsy. If positive for cancer, appropriate candidate for palliation and hospice.  Followed by palliative care.    DVT prophylaxis: Place and maintain sequential compression device Start: 04/22/21 2229   Code Status: DNR Family Communication: Sister and father at the bedside Disposition Plan: Status is: Inpatient Remains inpatient appropriate because: Altered mental status, inpatient procedures planned             Consultants:  Gastroenterology Palliative  Procedures:  Paracentesis  Antimicrobials:  Completed therapy.  Currently on rifaximin.   Subjective: Patient seen and examined.  Sister was at the bedside.  Father arrived later.  Overnight events noted.  Blood pressures were recorded in the 80s with no new events so no intervention was done. Patient is more awake today.  She was able to participate in conversation.  She was able to follow simple commands and was able to eat.  Sister was very excited that patient is more alert and communicating today after 3 days being unable to talk.  We discussed about further plans, biopsy plans and possibly placement after biopsies.  Objective: Vitals:   05/02/21 0500 05/02/21 0600 05/02/21 0849 05/02/21 1154  BP: (!) 90/57 (!) 86/53 106/63 (!) 89/49  Pulse: 92 91 92 86  Resp: (!) 9 10 16 16   Temp:   (!) 97.4 F (36.3 C) 97.8 F (36.6 C)  TempSrc:   Oral Oral  SpO2: 95% 96% 96% 98%  Weight:      Height:       No intake or output data in the 24 hours ending 05/02/21 1238 Filed Weights   04/22/21 1410  Weight: 82.3 kg     Examination:  General exam: Chronically sick looking.  Frail and debilitated.  Icteric.  On room air. Ecchymosis of the hands. Respiratory system: Clear to auscultation. Respiratory effort normal. Cardiovascular system: S1 & S2 heard, RRR.  1+ bilateral pedal edema. Gastrointestinal system: Distended obese and pendulous.  Nontender.  Bowel sound present. Central nervous system: Alert and oriented. No focal neurological deficits.  Generalized weakness. Extremities: Symmetric 5 x 5 power.     Data Reviewed: I have personally reviewed following labs and imaging studies  CBC: Recent Labs  Lab 04/26/21 0212 04/27/21 0438 04/28/21 0101 04/29/21 0242 05/01/21 0502  WBC 12.1* 11.0* 14.0* 13.8* 18.2*  NEUTROABS 8.3* 7.7 9.8* 8.9* 11.4*  HGB 7.5* 7.2* 9.2* 9.2* 8.1*  HCT 20.8* 20.7* 25.8* 25.6* 22.4*  MCV 80.6 81.8 79.9* 79.3* 80.9  PLT 93* 98* 97* 110* 983*   Basic Metabolic Panel: Recent Labs  Lab 04/28/21 0101 04/29/21 0242 04/30/21 1605 05/01/21 0502 05/02/21 0348  NA 136 134* 133* 133* 131*  K 3.9 3.7 3.5 3.6 3.2*  CL 105 104 102 103 102  CO2 18* 20* 21* 18* 20*  GLUCOSE 111* 119* 96 87 95  BUN 16 19 16 14 15   CREATININE 0.83 0.81 0.75 0.92 0.98  CALCIUM 8.2* 8.3* 8.2* 8.3* 8.0*   GFR: Estimated Creatinine Clearance: 63.1 mL/min (by C-G formula based on SCr of 0.98 mg/dL). Liver Function Tests: Recent Labs  Lab 04/27/21 0438 04/28/21 0101 04/29/21 0242 05/01/21 0502 05/02/21 0348  AST 52* 48* 55* 101* 90*  ALT 16 16 17  32 31  ALKPHOS 109 116 120 169* 148*  BILITOT 6.7* 8.6* 6.6* 5.6* 4.8*  PROT 4.9* 5.1* 5.1* 5.2* 4.7*  ALBUMIN 2.3* 2.4* 2.5* 2.4* 2.0*   No results for input(s): LIPASE, AMYLASE in the last 168 hours. Recent Labs  Lab 04/26/21 1510 04/30/21 1437 05/01/21 0502  AMMONIA 24 82* 56*   Coagulation Profile: Recent Labs  Lab 05/02/21 0348  INR 1.8*   Cardiac Enzymes: No results for input(s): CKTOTAL, CKMB, CKMBINDEX, TROPONINI  in the last 168 hours. BNP (last 3 results) No results for input(s): PROBNP in the last 8760 hours. HbA1C: No results for input(s): HGBA1C in the last 72 hours. CBG: Recent Labs  Lab 04/30/21 2013  GLUCAP 137*   Lipid Profile: No results for input(s): CHOL, HDL, LDLCALC, TRIG, CHOLHDL, LDLDIRECT in the last 72 hours. Thyroid Function Tests: No results for input(s): TSH, T4TOTAL, FREET4, T3FREE, THYROIDAB in the last 72 hours. Anemia Panel: No results for input(s): VITAMINB12, FOLATE, FERRITIN, TIBC, IRON, RETICCTPCT in the last 72 hours. Sepsis Labs: Recent Labs  Lab 05/01/21 0502 05/02/21 0348  PROCALCITON 0.59 0.81    Recent Results (from the past 240 hour(s))  Urine Culture     Status: Abnormal   Collection Time: 04/22/21  2:13 PM   Specimen: Urine, Catheterized  Result Value Ref Range Status   Specimen Description URINE, CATHETERIZED  Final   Special Requests   Final    NONE Performed at [facility], [address] [place], [place] [address]    Culture >=100,000 COLONIES/mL YEAST (A)  Final   Report Status 04/24/2021 FINAL  Final  Resp Panel by RT-PCR (Flu A&B, Covid) Nasopharyngeal Swab     Status: None  Collection Time: 04/22/21  7:36 PM   Specimen: Nasopharyngeal Swab; Nasopharyngeal(NP) swabs in vial transport medium  Result Value Ref Range Status   SARS Coronavirus 2 by RT PCR NEGATIVE NEGATIVE Final    Comment: (NOTE) SARS-CoV-2 target nucleic acids are NOT DETECTED.  The SARS-CoV-2 RNA is generally detectable in upper respiratory specimens during the acute phase of infection. The lowest concentration of SARS-CoV-2 viral copies this assay can detect is 138 copies/mL. A negative result does not preclude SARS-Cov-2 infection and should not be used as the sole basis for treatment or other patient management decisions. A negative result may occur with  improper specimen collection/handling, submission of specimen other than nasopharyngeal swab,  presence of viral mutation(s) within the areas targeted by this assay, and inadequate number of viral copies(<138 copies/mL). A negative result must be combined with clinical observations, patient history, and epidemiological information. The expected result is Negative.  Fact Sheet for Patients:  EntrepreneurPulse.com.au  Fact Sheet for Healthcare Providers:  IncredibleEmployment.be  This test is no t yet approved or cleared by the [place] FDA and  has been authorized for detection and/or diagnosis of SARS-CoV-2 by FDA under an Emergency Use Authorization (EUA). This EUA will remain  in effect (meaning this test can be used) for the duration of the COVID-19 declaration under Section 564(b)(1) of the Act, 21 U.S.C.section 360bbb-3(b)(1), unless the authorization is terminated  or revoked sooner.       Influenza A by PCR NEGATIVE NEGATIVE Final   Influenza B by PCR NEGATIVE NEGATIVE Final    Comment: (NOTE) The Xpert Xpress SARS-CoV-2/FLU/RSV plus assay is intended as an aid in the diagnosis of influenza from Nasopharyngeal swab specimens and should not be used as a sole basis for treatment. Nasal washings and aspirates are unacceptable for Xpert Xpress SARS-CoV-2/FLU/RSV testing.  Fact Sheet for Patients: EntrepreneurPulse.com.au  Fact Sheet for Healthcare Providers: IncredibleEmployment.be  This test is not yet approved or cleared by the [place] FDA and has been authorized for detection and/or diagnosis of SARS-CoV-2 by FDA under an Emergency Use Authorization (EUA). This EUA will remain in effect (meaning this test can be used) for the duration of the COVID-19 declaration under Section 564(b)(1) of the Act, 21 U.S.C. section 360bbb-3(b)(1), unless the authorization is terminated or revoked.  Performed at [facility], [address], [place], [place] [address]   MRSA Next Gen by PCR,  Nasal     Status: None   Collection Time: 04/22/21  9:38 PM   Specimen: Nasal Mucosa; Nasal Swab  Result Value Ref Range Status   MRSA by PCR Next Gen NOT DETECTED NOT DETECTED Final    Comment: (NOTE) The GeneXpert MRSA Assay (FDA approved for NASAL specimens only), is one component of a comprehensive MRSA colonization surveillance program. It is not intended to diagnose MRSA infection nor to guide or monitor treatment for MRSA infections. Test performance is not FDA approved in patients less than 74 years old. Performed at [facility], [place] [address], [place], [place] [address]   Culture, blood (routine x 2)     Status: None   Collection Time: 04/23/21 12:11 PM   Specimen: BLOOD  Result Value Ref Range Status   Specimen Description BLOOD SITE NOT SPECIFIED  Final   Special Requests   Final    BOTTLES DRAWN AEROBIC AND ANAEROBIC Blood Culture adequate volume   Culture   Final    NO GROWTH 5 DAYS Performed at [facility], [address] [place], [place] [address]    Report Status 04/28/2021 FINAL  Final  Culture, blood (routine x 2)     Status: None   Collection Time: 04/23/21 12:35 PM   Specimen: BLOOD  Result Value Ref Range Status   Specimen Description BLOOD SITE NOT SPECIFIED  Final   Special Requests   Final    BOTTLES DRAWN AEROBIC AND ANAEROBIC Blood Culture adequate volume   Culture   Final    NO GROWTH 5 DAYS Performed at [facility], [address] [place], [place] [address]    Report Status 04/28/2021 FINAL  Final  Peritoneal fluid culture w Gram Stain     Status: None   Collection Time: 04/23/21  4:49 PM   Specimen: Peritoneal Washings; Peritoneal Fluid  Result Value Ref Range Status   Specimen Description PERITONEAL  Final   Special Requests NONE  Final   Gram Stain   Final    FEW WBC PRESENT, PREDOMINANTLY MONONUCLEAR NO ORGANISMS SEEN    Culture   Final    NO GROWTH 3 DAYS Performed at [facility], [address]  [place], [place] [address]    Report Status 04/26/2021 FINAL  Final         Radiology Studies: CT HEAD WO CONTRAST (5MM)  Result Date: 05/01/2021 CLINICAL DATA:  Altered mental status. EXAM: CT HEAD WITHOUT CONTRAST TECHNIQUE: Contiguous axial images were obtained from the base of the skull through the vertex without intravenous contrast. RADIATION DOSE REDUCTION: This exam was performed according to the departmental dose-optimization program which includes automated exposure control, adjustment of the mA and/or kV according to patient size and/or use of iterative reconstruction technique. COMPARISON:  None. FINDINGS: Brain: Mild diffuse cortical atrophy is noted. No evidence of acute infarction, hemorrhage, hydrocephalus, extra-axial collection or mass lesion/mass effect. Vascular: No hyperdense vessel or unexpected calcification. Skull: Normal. Negative for fracture or focal lesion. Sinuses/Orbits: No acute finding. Other: None. IMPRESSION: Mild diffuse cortical atrophy. No acute intracranial abnormality seen. Electronically Signed   By: Marijo Conception M.D.   On: 05/01/2021 15:59   DG CHEST PORT 1 VIEW  Result Date: 05/01/2021 CLINICAL DATA:  Altered mental status. EXAM: PORTABLE CHEST 1 VIEW COMPARISON:  04/23/2021 FINDINGS: Interval removal of the left IJ central venous catheter. The cardiac silhouette, mediastinal and hilar contours are within normal limits given the AP projection and portable technique. Low lung volumes with vascular crowding and bibasilar atelectasis. No definite infiltrates or effusions. The bony thorax is intact. IMPRESSION: Low lung volumes with vascular crowding and bibasilar atelectasis. Electronically Signed   By: Marijo Sanes M.D.   On: 05/01/2021 14:46        Scheduled Meds:  feeding supplement  1 Container Oral TID BM   lactulose  30 g Oral TID   mouth rinse  15 mL Mouth Rinse BID   midodrine  15 mg Oral TID WC   multivitamin with minerals  1 tablet Oral Daily    ondansetron (ZOFRAN) IV  4 mg Intravenous Q8H   pantoprazole  40 mg Oral QHS   potassium chloride  40 mEq Oral BID   pregabalin  25 mg Oral BID   rifaximin  550 mg Oral BID   sertraline  25 mg Oral QPC breakfast   Continuous Infusions:  sodium chloride Stopped (04/23/21 0504)   phytonadione (VITAMIN K) IV 5 mg (05/02/21 1036)     LOS: 10 days    Time spent: 35  minutes    Barb Merino, MD [facility] Pager [phone]

## 2021-05-02 NOTE — Progress Notes (Signed)
? ?  On my list ?Not rounding unless asked or chart review suggests need. ?Await liver bx results - that will likely take until next week. ? ?Gatha Mayer, MD, Marval Regal ?[facility] ?05/02/2021 4:33 PM ? ?

## 2021-05-02 NOTE — Progress Notes (Signed)
?   05/02/21 0000  ?Vitals  ?Temp 98.1 ?F (36.7 ?C)  ?Temp Source Oral  ?BP (!) 81/45  ?MAP (mmHg) (!) 57  ?BP Location Right Arm  ?BP Method Automatic  ?Patient Position (if appropriate) Lying  ?Pulse Rate 82  ?Pulse Rate Source Monitor  ?ECG Heart Rate 82  ?Resp (!) 8  ?Level of Consciousness  ?Level of Consciousness Alert  ?MEWS COLOR  ?MEWS Score Color Yellow  ?Oxygen Therapy  ?SpO2 94 %  ?O2 Device Room Air  ?Pain Assessment  ?Pain Scale 0-10  ?Pain Score 5  ?Pain Type Chronic pain  ?Pain Location Back  ?Pain Descriptors / Indicators Aching  ?Pain Frequency Constant  ?Pain Onset On-going  ?Patients Stated Pain Goal 5  ?Pain Intervention(s) Environmental changes;Rest;Relaxation  ?ECG Monitoring  ?Cardiac Rhythm NSR  ?Glasgow Coma Scale  ?Eye Opening 4  ?Best Verbal Response (NON-intubated) 5  ?Best Motor Response 6  ?Glasgow Coma Scale Score 15  ?MEWS Score  ?MEWS Temp 0  ?MEWS Systolic 1  ?MEWS Pulse 0  ?MEWS RR 1  ?MEWS LOC 0  ?MEWS Score 2  ?Provider Notification  ?Provider Name/Title Inda Merlin MD  ?Date Provider Notified 05/02/21  ?Time Provider Notified 0009  ?Notification Type  ?(Secure Message)  ?Notification Reason Other (Comment) ?(Hypotensive)  ?Provider response No new orders  ?Date of Provider Response 05/02/21  ?Time of Provider Response 0030  ? ? ?

## 2021-05-02 NOTE — Plan of Care (Signed)

## 2021-05-02 NOTE — Progress Notes (Signed)
[name] x 4, on RA, SBP 81/45 Map 57. MD notified Atilano Median. No new orders.  ?

## 2021-05-02 NOTE — Progress Notes (Signed)
HOSPITAL MEDICINE OVERNIGHT EVENT NOTE   ? ?Patient reports concern over relative hypotension with blood pressure of 81/45 with a MAP of 57. ? ?According to nursing patient has not exhibited any change clinically.  Patient is still awake alert and oriented.  Patient has no particular complaints.  On exam patient is developing worsening peripheral edema.  Nursing additionally reports the patient is urinating. ? ?Considering the patient has a known history of cirrhosis, many times these patients tend to have low blood pressures.  Patient's blood pressures currently, albeit low, are not much lower than her baseline.  I am concerned that if we continue to place patient on intravenous fluids and bolused the patient she will develop progressively worsening third spacing. ? ?In the absence of any new clinical finding, we will continue to monitor patient closely.  If hypotension worsens will reconsider bolusing patient with fluids. ? ?Vernelle Emerald  MD ?[facility]  ? ? ? ? ? ? ? ? ? ? ?

## 2021-05-02 NOTE — Consult Note (Signed)
Chief Complaint: Patient was seen in consultation today for  Chief Complaint  Patient presents with   Weakness    Referring Physician(s): Dr. Carlean Purl  Supervising Physician: Ruthann Cancer  Patient Status: [facility] - In-pt  History of Present Illness: Sharon Lawrence is a 56 y.o. female with a medical history significant for ADHD, CAD (STEMI in 2019 with DES to RCA), celiac disease, GI bleed and NASH cirrhosis.   She presented to the ED 04/22/21 with nausea/vomiting, dehydration and abdominal pain. She was found to be hypotensive and anemic and was admitted for shock/sepsis and decompensated cirrhosis. She was admitted to the ICU for vasopressor support and PRBCs and her hospital course has been complicated by extensive weakness, failure to thrive, and encephalopathy secondary to hyperammonemia. Work up so far has been negative for autoimmune hepatitis and viral hepatitis. The patient is followed by multiple specialties including Palliative care and gastroenterology and MR liver 04/25/21 showed hepatic lesions and cirrhotic changes. Per notes by Hospitalist team, the patient and her family would like a diagnosis before deciding to pursue palliative care.   Interventional Radiology has been asked to evaluate this patient for an image-guided liver lesion biopsy and an image-guided non-focal liver biopsy. Imaging reviewed and procedure approved by Dr. Serafina Royals.   Past Medical History:  Diagnosis Date   Acute ST elevation myocardial infarction (STEMI) of inferior wall ([place]) 09/15/2017   ADHD (attention deficit hyperactivity disorder)    Anxiety    B12 deficiency 02/07/2011   Back pain    CAD (coronary artery disease)    a. s/p recent STEMI on 09/15/2017 with DES to RCA   Celiac disease    Folic acid deficiency 62/70/3500   GI bleed 11/06/2017   Headache    Hepatic cirrhosis ([place])    History of ST elevation myocardial infarction (STEMI) 11/06/2017   Iron deficiency anemia 02/07/2011    Myocardial infarction ([place]) 09/15/2017   Syncope and collapse 11/06/2017    Past Surgical History:  Procedure Laterality Date   BIOPSY  07/12/2020   Procedure: BIOPSY;  Surgeon: Rogene Houston, MD;  Location[facility];  Service: Endoscopy;;  duodenum(2nd part);antral   CHOLECYSTECTOMY     COLONOSCOPY WITH PROPOFOL N/A 09/26/2017   rehman examined portion of ileum normal, - Five small, non-bleeding polyps in the rectum, in the sigmoid colon and at the hepatic   CORONARY STENT INTERVENTION Right 09/15/2017   Procedure: CORONARY STENT INTERVENTION;  Surgeon: Jettie Booze, MD;  Location: [place] [facility];  Service: Cardiovascular;  Laterality: Right;  RCA   ESOPHAGOGASTRODUODENOSCOPY (EGD) WITH PROPOFOL N/A 07/12/2020   rehman: normal hypopharynx, Normal esophagus. z line regular 35 cm from incisors, congested and erythematous mucosa in antrum, mucosal changes in duodenum, very pronounced changes to suggest active celiac disease   KNEE CARTILAGE SURGERY Right    LEFT HEART CATH AND CORONARY ANGIOGRAPHY N/A 09/15/2017   Procedure: LEFT HEART CATH AND CORONARY ANGIOGRAPHY;  Surgeon: Jettie Booze, MD;  Location: [place];  Service: Cardiovascular;  Laterality: N/A;   TONSILLECTOMY      Allergies: Bee venom, Erythromycin, Cephalexin, Neomycin-bacitracin-polymyxin  [bacitracin-neomycin-polymyxin], Neosporin original [bacitracin-neomycin-polymyxin], Prednisone, Sulfa antibiotics, Tobrex [tobramycin], Tramadol, and Penicillins  Medications: Prior to Admission medications   Medication Sig Start Date End Date Taking? Authorizing Provider  aspirin 81 MG chewable tablet Chew 1 tablet (81 mg total) by mouth daily. 09/18/17  Yes Reino Bellis B, NP  famotidine (PEPCID) 40 MG tablet Take 1 tablet (40  mg total) by mouth at bedtime. 10/26/20  Yes Rehman, Mechele Dawley, MD  ketoconazole (NIZORAL) 2 % cream Apply topically 2 (two) times daily. Patient taking differently: Apply  topically 2 (two) times daily as needed for irritation. 12/16/20  Yes Johnson, Eldridge Dace, MD  levonorgestrel (MIRENA) 20 MCG/24HR IUD by Intrauterine route.   Yes [provider]  loperamide (IMODIUM) 2 MG capsule Take 1 capsule (2 mg total) by mouth 3 (three) times daily as needed for diarrhea or loose stools. 07/14/20  Yes Kathie Dike, MD  metoCLOPramide (REGLAN) 10 MG tablet Take 1 tablet (10 mg total) by mouth every 8 (eight) hours as needed for nausea. 12/16/20  Yes Johnson, Clanford L, MD  metoprolol succinate (TOPROL XL) 25 MG 24 hr tablet Take 1 tablet (25 mg total) by mouth daily. 12/27/20  Yes Strader, [place] M, PA-C  nitroGLYCERIN (NITROLINGUAL) 0.4 MG/SPRAY spray Place 2 sprays under the tongue every 5 (five) minutes x 3 doses as needed. 04/03/20  Yes [provider]  nystatin (MYCOSTATIN/NYSTOP) powder Apply topically 3 (three) times daily as needed (yeast infection). 12/16/20  Yes Johnson, Clanford L, MD  omeprazole (PRILOSEC) 40 MG capsule Take 1 capsule (40 mg total) by mouth daily. 11/28/20  Yes Carlan, Chelsea L, NP  ondansetron (ZOFRAN-ODT) 8 MG disintegrating tablet Take 1 tablet (8 mg total) by mouth 3 (three) times daily as needed for nausea. 07/14/20  Yes Kathie Dike, MD  potassium chloride (KLOR-CON) 10 MEQ tablet Take 10 meq daily when you take Torsemide 12/28/20  Yes Strader, [place], PA-C  pregabalin (LYRICA) 75 MG capsule Take 1 capsule (75 mg total) by mouth 2 (two) times daily. 04/16/21  Yes Lindell Spar, MD  prochlorperazine (COMPAZINE) 10 MG tablet TAKE (1) TABLET BY MOUTH EVERY SIX HOURS AS NEEDED NAUSEA OR VOMITING. Patient taking differently: Take 10 mg by mouth every 6 (six) hours as needed for nausea or vomiting. 10/10/20  Yes Rehman, Mechele Dawley, MD  rizatriptan (MAXALT) 10 MG tablet Take 10 mg by mouth daily as needed for migraine. 12/31/19  Yes [provider]  tiZANidine (ZANAFLEX) 4 MG tablet Take 1 tablet (4 mg total) by mouth  every 8 (eight) hours as needed for muscle spasms. 01/11/21  Yes Lindell Spar, MD  torsemide (DEMADEX) 20 MG tablet Take 1 tablet (20 mg total) by mouth daily as needed (edema). As needed. 12/16/20  Yes Johnson, Clanford L, MD  lactulose, encephalopathy, (CHRONULAC) 10 GM/15ML SOLN Take 45 mLs (30 g total) by mouth 2 (two) times daily. 04/30/21   Hosie Poisson, MD  lactulose, encephalopathy, (CHRONULAC) 10 GM/15ML SOLN Take 45 mLs (30 g total) by mouth 2 (two) times daily. 04/30/21   Hosie Poisson, MD  midodrine (PROAMATINE) 5 MG tablet Take 3 tablets (15 mg total) by mouth 3 (three) times daily with meals. 04/30/21   Hosie Poisson, MD  midodrine (PROAMATINE) 5 MG tablet Take 3 tablets (15 mg total) by mouth 3 (three) times daily with meals. 04/30/21   Hosie Poisson, MD  Multiple Vitamins-Minerals (CERTAVITE/ANTIOXIDANTS) TABS Take 1 tablet by mouth daily. 05/01/21   Hosie Poisson, MD  sertraline (ZOLOFT) 25 MG tablet Take 1 tablet (25 mg total) by mouth daily after breakfast. 05/01/21   Hosie Poisson, MD  Vitamin D, Ergocalciferol, (DRISDOL) 1.25 MG (50000 UT) CAPS capsule Take 50,000 Units by mouth every 7 (seven) days. Wednesday Patient not taking: Reported on 04/23/2021 09/14/18   [provider]     Family History  Problem  Relation Age of Onset   Cancer Mother    Hypertension Mother    Hypertension Father    Hypothyroidism Father    Cancer Sister    Anxiety disorder Sister    Depression Sister    CAD Maternal Grandfather    Cirrhosis Paternal Grandfather    CAD Paternal Grandmother    Diabetes Paternal Grandmother    Hypothyroidism Paternal Grandmother    Drug abuse Son    Bipolar disorder Son     Social History   Socioeconomic History   Marital status: Widowed    Spouse name: Not on file   Number of children: 3   Years of education: Not on file   Highest education level: Associate degree: occupational, Hotel manager, or vocational program  Occupational History   Not on  file  Tobacco Use   Smoking status: Former    Packs/day: 0.25    Years: 12.00    Pack years: 3.00    Types: Cigarettes    Quit date: 04/04/2017    Years since quitting: 4.0   Smokeless tobacco: Never  Vaping Use   Vaping Use: Never used  Substance and Sexual Activity   Alcohol use: No   Drug use: No   Sexual activity: Not Currently  Other Topics Concern   Not on file  Social History Narrative   Not on file   Social Determinants of Health   Financial Resource Strain: Not on file  Food Insecurity: Not on file  Transportation Needs: Not on file  Physical Activity: Not on file  Stress: Not on file  Social Connections: Not on file    Review of Systems: A 12 point ROS discussed and pertinent positives are indicated in the HPI above.  All other systems are negative.  Review of Systems  Constitutional:  Positive for appetite change and fatigue.  Cardiovascular:  Positive for leg swelling.  Gastrointestinal:  Positive for abdominal pain, diarrhea and nausea.  Musculoskeletal:  Positive for back pain.  Hematological:  Bruises/bleeds easily.   Vital Signs: BP (!) 89/49 (BP Location: Right Arm)    Pulse 86    Temp 97.8 F (36.6 C) (Oral)    Resp 16    Ht 5' (1.524 m)    Wt 181 lb 7 oz (82.3 kg)    SpO2 98%    BMI 35.43 kg/m   Physical Exam Constitutional:      General: She is not in acute distress.    Appearance: She is ill-appearing.  HENT:     Mouth/Throat:     Mouth: Mucous membranes are moist.     Pharynx: Oropharynx is clear.  Cardiovascular:     Rate and Rhythm: Normal rate and regular rhythm.     Pulses: Normal pulses.     Heart sounds: Normal heart sounds.  Pulmonary:     Effort: Pulmonary effort is normal.     Breath sounds: Normal breath sounds.  Abdominal:     General: Bowel sounds are increased. There is distension.  Musculoskeletal:     Right lower leg: Edema present.     Left lower leg: Edema present.     Comments: +4 bilateral lower extremity edema    Skin:    General: Skin is warm and dry.     Coloration: Skin is jaundiced.     Findings: Bruising present.  Neurological:     Mental Status: She is alert and oriented to person, place, and time.    Imaging: CT HEAD WO CONTRAST (5MM)  Result Date: 05/01/2021 CLINICAL DATA:  Altered mental status. EXAM: CT HEAD WITHOUT CONTRAST TECHNIQUE: Contiguous axial images were obtained from the base of the skull through the vertex without intravenous contrast. RADIATION DOSE REDUCTION: This exam was performed according to the departmental dose-optimization program which includes automated exposure control, adjustment of the mA and/or kV according to patient size and/or use of iterative reconstruction technique. COMPARISON:  None. FINDINGS: Brain: Mild diffuse cortical atrophy is noted. No evidence of acute infarction, hemorrhage, hydrocephalus, extra-axial collection or mass lesion/mass effect. Vascular: No hyperdense vessel or unexpected calcification. Skull: Normal. Negative for fracture or focal lesion. Sinuses/Orbits: No acute finding. Other: None. IMPRESSION: Mild diffuse cortical atrophy. No acute intracranial abnormality seen. Electronically Signed   By: Marijo Conception M.D.   On: 05/01/2021 15:59   MR LIVER W WO CONTRAST  Result Date: 04/25/2021 CLINICAL DATA:  Cirrhosis with liver lesions on prior CT scan. EXAM: MRI ABDOMEN WITHOUT AND WITH CONTRAST TECHNIQUE: Multiplanar multisequence MR imaging of the abdomen was performed both before and after the administration of intravenous contrast. CONTRAST:  8.35m GADAVIST GADOBUTROL 1 MMOL/ML IV SOLN COMPARISON:  CT scan 12/12/2020 FINDINGS: Examination is limited by body habitus and breathing motion artifact. Lower chest: The lung bases are clear of an acute process. No pleural or pericardial effusion. Hepatobiliary: Heterogeneous fatty infiltration of the liver is demonstrated. There are also cirrhotic changes which appear stable. No intrahepatic biliary  dilatation. Gallbladder is surgically absent. On the early arterial phase enhancement sequence there are enhancing hepatic lesions. These are ill-defined and difficult to accurately characterize. 16 mm cortical or subcortical lesion and segment 4 B is faintly present on the portal venous phase sequence but I do not see it definitely after that. 11 mm lesion and segment 3 anteriorly is persistent on delayed sequences without definite washout. This also demonstrates slight increased T2 signal intensity and potentially could be a may angioma. 2 cm lesion and segment 3 laterally is best seen on the delayed sequence with there appears to be subcapsular enhancement. This is the most worrisome for a hepatoma but is indeterminate. Nonenhancing lesion and segment 6 is likely a cyst. Pancreas:  No mass, inflammation or ductal dilatation. Spleen:  Within normal limits in size.  No splenic lesions. Adrenals/Urinary Tract: The adrenal glands demonstrate small bilateral nodules which show loss of signal intensity on the out of phase gradient echo sequence and are likely benign adenomas. No change since 2019. No renal lesions or hydronephrosis. Stomach/Bowel: The stomach, duodenum, small bowel and colon grossly normal. Vascular/Lymphatic: The aorta and branch vessels are patent. The major venous structures are patent. No adenopathy. Other: Moderate volume abdominal ascites and diffuse body wall edema. Musculoskeletal: No significant bony findings. IMPRESSION: 1. Limited examination due to body habitus and breathing motion artifact. 2. Heterogeneous fatty infiltration of the liver and cirrhotic changes. 3. Hepatic lesions are difficult to accurately characterize and to definitively use LI-RADS criteria but suspicious for hepatomas. Recommend close observation. 4. Bilateral adrenal gland adenomas. 5. Moderate volume abdominal ascites and diffuse body wall edema. Electronically Signed   By: PMarijo SanesM.D.   On: 04/25/2021 19:32    UKoreaAbdomen Limited  Result Date: 04/29/2021 CLINICAL DATA:  ASCITES EXAM: LIMITED ABDOMEN ULTRASOUND FOR ASCITES TECHNIQUE: Limited ultrasound survey for ascites was performed in all four abdominal quadrants. COMPARISON:  None. FINDINGS: Sonographic evaluation of the 4 quadrants of the abdomen demonstrates only small volume ascites with multiple loops of mildly dilated and fluid-filled small bowel.  There was no safe window to allow for paracentesis. IMPRESSION: Small volume ascites, insufficient for paracentesis. Several loops of fluid-filled small bowel visualized. Recommend clinical correlation for evidence of small-bowel obstruction or ileus. Electronically Signed   By: Jacqulynn Cadet M.D.   On: 04/29/2021 10:34   DG CHEST PORT 1 VIEW  Result Date: 05/01/2021 CLINICAL DATA:  Altered mental status. EXAM: PORTABLE CHEST 1 VIEW COMPARISON:  04/23/2021 FINDINGS: Interval removal of the left IJ central venous catheter. The cardiac silhouette, mediastinal and hilar contours are within normal limits given the AP projection and portable technique. Low lung volumes with vascular crowding and bibasilar atelectasis. No definite infiltrates or effusions. The bony thorax is intact. IMPRESSION: Low lung volumes with vascular crowding and bibasilar atelectasis. Electronically Signed   By: Marijo Sanes M.D.   On: 05/01/2021 14:46   DG CHEST PORT 1 VIEW  Result Date: 04/23/2021 CLINICAL DATA:  Central line placement EXAM: PORTABLE CHEST 1 VIEW COMPARISON:  April 22, 2021 FINDINGS: The heart size and mediastinal contours are within normal limits. Left central venous line is identified with distal tip in the superior vena cava. There is no pneumothorax. Mild atelectasis of left lung base is noted. The visualized skeletal structures are unremarkable. IMPRESSION: Left central venous line distal tip in the superior vena cava. No pneumothorax. Electronically Signed   By: Abelardo Diesel M.D.   On: 04/23/2021 12:19    DG Chest Port 1 View  Result Date: 04/22/2021 CLINICAL DATA:  Questionable sepsis - evaluate for abnormality EXAM: PORTABLE CHEST 1 VIEW COMPARISON:  Jul 09, 2020. FINDINGS: Question trace left pleural effusion. Overlying linear left basilar opacity. No visible pneumothorax. Cardiomediastinal silhouette is within normal limits and similar. Cholecystectomy clips. IMPRESSION: Question trace left pleural effusion. Overlying linear opacity most likely represents atelectasis although infection is not excluded. Dedicated PA and lateral radiographs could better characterize if clinically warranted. Electronically Signed   By: Margaretha Sheffield M.D.   On: 04/22/2021 15:58   ECHOCARDIOGRAM COMPLETE  Result Date: 04/23/2021    ECHOCARDIOGRAM REPORT   Patient Name:   Jim K Douthat Date of Exam: 04/23/2021 Medical Rec #:  [id number]  Height:       60.0 in Accession #:    [id number] Weight:       181.4 lb Date of Birth:  21-Feb-1966  BSA:          1.791 m Patient Age:    72 years   BP:           92/60 mmHg Patient Gender: F          HR:           93 bpm. Exam Location:  Inpatient Procedure: 2D Echo Indications:    abnormal ecg  History:        Patient has prior history of Echocardiogram examinations, most                 recent 01/07/2020.  Sonographer:    Johny Chess RDCS Referring Phys: [id number] [place]  1. Left ventricular ejection fraction, by estimation, is 60 to 65%. The left ventricle has normal function. The left ventricle has no regional wall motion abnormalities. Left ventricular diastolic parameters were normal.  2. Right ventricular systolic function is normal. The right ventricular size is normal.  3. The mitral valve is normal in structure. Trivial mitral valve regurgitation.  4. The aortic valve is normal in structure. Aortic valve regurgitation is not visualized. FINDINGS  Left Ventricle: Left ventricular ejection fraction, by estimation, is 60 to 65%. The left ventricle has normal  function. The left ventricle has no regional wall motion abnormalities. The left ventricular internal cavity size was normal in size. There is  no left ventricular hypertrophy. Left ventricular diastolic parameters were normal. Right Ventricle: The right ventricular size is normal. Right vetricular wall thickness was not well visualized. Right ventricular systolic function is normal. Left Atrium: Left atrial size was normal in size. Right Atrium: Right atrial size was normal in size. Pericardium: There is no evidence of pericardial effusion. Mitral Valve: The mitral valve is normal in structure. Trivial mitral valve regurgitation. Tricuspid Valve: The tricuspid valve is grossly normal. Tricuspid valve regurgitation is not demonstrated. Aortic Valve: The aortic valve is normal in structure. Aortic valve regurgitation is not visualized. Pulmonic Valve: The pulmonic valve was normal in structure. Pulmonic valve regurgitation is not visualized. Aorta: The aortic root and ascending aorta are structurally normal, with no evidence of dilitation. IAS/Shunts: The atrial septum is grossly normal.  LEFT VENTRICLE PLAX 2D LVIDd:         4.70 cm   Diastology LVIDs:         2.40 cm   LV e' medial:    11.20 cm/s LV PW:         0.90 cm   LV E/e' medial:  10.4 LV IVS:        0.70 cm   LV e' lateral:   13.90 cm/s LVOT diam:     1.90 cm   LV E/e' lateral: 8.4 LV SV:         77 LV SV Index:   43 LVOT Area:     2.84 cm  RIGHT VENTRICLE             IVC RV S prime:     17.80 cm/s  IVC diam: 1.70 cm TAPSE (M-mode): 4.2 cm LEFT ATRIUM             Index        RIGHT ATRIUM           Index LA diam:        3.70 cm 2.07 cm/m   RA Area:     12.10 cm LA Vol (A2C):   36.9 ml 20.60 ml/m  RA Volume:   27.50 ml  15.36 ml/m LA Vol (A4C):   36.3 ml 20.27 ml/m LA Biplane Vol: 38.1 ml 21.27 ml/m  AORTIC VALVE LVOT Vmax:   155.00 cm/s LVOT Vmean:  105.000 cm/s LVOT VTI:    0.272 m  AORTA Ao Root diam: 2.80 cm Ao Asc diam:  3.00 cm MITRAL VALVE MV  Area (PHT): 4.36 cm     SHUNTS MV Decel Time: 174 msec     Systemic VTI:  0.27 m MV E velocity: 117.00 cm/s  Systemic Diam: 1.90 cm MV A velocity: 75.30 cm/s MV E/A ratio:  1.55 Mertie Moores MD Electronically signed by Mertie Moores MD Signature Date/Time: 04/23/2021/3:37:57 PM    Final    US Abdomen Limited RUQ (LIVER/GB)  Result Date: 04/23/2021 CLINICAL DATA:  Abdominal pain. EXAM: ULTRASOUND ABDOMEN LIMITED RIGHT UPPER QUADRANT COMPARISON:  12/12/2020. FINDINGS: Gallbladder: Surgically absent. Common bile duct: Diameter: 5.5 mm. Liver: No focal lesion identified. The liver has a nodular contour and increased echogenicity is noted. Portal vein is patent on color Doppler imaging with normal direction of blood flow towards the liver. Other: Mild-to-moderate perihepatic ascites. IMPRESSION: 1. Morphologic changes of  cirrhosis. 2. Mild-to-moderate ascites in the right upper quadrant. Electronically Signed   By: Brett Fairy M.D.   On: 04/23/2021 00:29    Labs:  CBC: Recent Labs    04/27/21 0438 04/28/21 0101 04/29/21 0242 05/01/21 0502  WBC 11.0* 14.0* 13.8* 18.2*  HGB 7.2* 9.2* 9.2* 8.1*  HCT 20.7* 25.8* 25.6* 22.4*  PLT 98* 97* 110* 120*    COAGS: Recent Labs    12/13/20 0435 04/22/21 1509 04/23/21 0220 04/24/21 0433 04/24/21 1633 05/02/21 0348  INR 1.2 1.8* 2.0* 1.9* 1.9* 1.8*  APTT 40* 43*  --   --   --  41*    BMP: Recent Labs    04/29/21 0242 04/30/21 1605 05/01/21 0502 05/02/21 0348  NA 134* 133* 133* 131*  K 3.7 3.5 3.6 3.2*  CL 104 102 103 102  CO2 20* 21* 18* 20*  GLUCOSE 119* 96 87 95  BUN 19 16 14 15   CALCIUM 8.3* 8.2* 8.3* 8.0*  CREATININE 0.81 0.75 0.92 0.98  GFRNONAA >60 >60 >60 >60    LIVER FUNCTION TESTS: Recent Labs    04/28/21 0101 04/29/21 0242 05/01/21 0502 05/02/21 0348  BILITOT 8.6* 6.6* 5.6* 4.8*  AST 48* 55* 101* 90*  ALT 16 17 32 31  ALKPHOS 116 120 169* 148*  PROT 5.1* 5.1* 5.2* 4.7*  ALBUMIN 2.4* 2.5* 2.4* 2.0*    TUMOR  MARKERS: No results for input(s): AFPTM, CEA, CA199, CHROMGRNA in the last 8760 hours.  Assessment and Plan:  Cirrhosis; liver lesions: Nadra K. Moehle, 57 year old female, is tentatively scheduled 05/03/21 for an image-guided liver lesion biopsy and an image-guided non-focal liver biopsy. The procedure was discussed with the patient and her sister at the bedside. The procedure was also discussed with the patient's father who signed the consent at the patient's request.   Risks and benefits of these procedures were discussed with the patient and/or patient's family including, but not limited to bleeding, infection, damage to adjacent structures or low yield requiring additional tests.  All of the questions were answered and there is agreement to proceed. She will be NPO at midnight. CBC and PT/INR ordered for the morning. The patient is currently hypotensive and IR will evaluate vitals signs in the morning. Patient may require minimal sedation for this procedure and the patient is aware of this.    Consent signed and in chart.  Thank you for this interesting consult.  I greatly enjoyed meeting Nivea K Yiu and look forward to participating in their care.  A copy of this report was sent to the requesting provider on this date.  Electronically Signed: Soyla Dryer, AGACNP-BC [phone] 05/02/2021, 2:27 PM   I spent a total of 40 Minutes    in face to face in clinical consultation, greater than 50% of which was counseling/coordinating care for liver lesion biopsy; non-focal liver biopsy

## 2021-05-03 ENCOUNTER — Inpatient Hospital Stay (HOSPITAL_COMMUNITY): Payer: Self-pay

## 2021-05-03 LAB — CBC
HCT: 21.9 % — ABNORMAL LOW (ref 36.0–46.0)
Hemoglobin: 7.5 g/dL — ABNORMAL LOW (ref 12.0–15.0)
MCH: 28.1 pg (ref 26.0–34.0)
MCHC: 34.2 g/dL (ref 30.0–36.0)
MCV: 82 fL (ref 80.0–100.0)
Platelets: 143 10*3/uL — ABNORMAL LOW (ref 150–400)
RBC: 2.67 MIL/uL — ABNORMAL LOW (ref 3.87–5.11)
RDW: 18 % — ABNORMAL HIGH (ref 11.5–15.5)
WBC: 19.4 10*3/uL — ABNORMAL HIGH (ref 4.0–10.5)
nRBC: 0.3 % — ABNORMAL HIGH (ref 0.0–0.2)

## 2021-05-03 LAB — PROTIME-INR
INR: 1.7 — ABNORMAL HIGH (ref 0.8–1.2)
Prothrombin Time: 20.3 seconds — ABNORMAL HIGH (ref 11.4–15.2)

## 2021-05-03 LAB — PROCALCITONIN: Procalcitonin: 0.9 ng/mL

## 2021-05-03 MED ORDER — OXYCODONE HCL 5 MG PO TABS
2.5000 mg | ORAL_TABLET | Freq: Four times a day (QID) | ORAL | Status: DC | PRN
  Administered 2021-05-04 – 2021-05-06 (×8): 2.5 mg via ORAL
  Filled 2021-05-03 (×8): qty 1

## 2021-05-03 MED ORDER — FENTANYL CITRATE (PF) 100 MCG/2ML IJ SOLN
INTRAMUSCULAR | Status: AC
  Filled 2021-05-03: qty 2

## 2021-05-03 MED ORDER — MIDAZOLAM HCL 2 MG/2ML IJ SOLN
INTRAMUSCULAR | Status: AC
  Filled 2021-05-03: qty 2

## 2021-05-03 MED ORDER — MIDAZOLAM HCL 2 MG/2ML IJ SOLN
INTRAMUSCULAR | Status: AC | PRN
  Administered 2021-05-03: .5 mg via INTRAVENOUS

## 2021-05-03 MED ORDER — LIDOCAINE-EPINEPHRINE 1 %-1:100000 IJ SOLN
INTRAMUSCULAR | Status: AC
  Filled 2021-05-03: qty 1

## 2021-05-03 MED ORDER — FENTANYL CITRATE (PF) 100 MCG/2ML IJ SOLN
INTRAMUSCULAR | Status: AC | PRN
  Administered 2021-05-03: 25 ug via INTRAVENOUS

## 2021-05-03 MED ORDER — GELATIN ABSORBABLE 12-7 MM EX MISC
CUTANEOUS | Status: AC
  Filled 2021-05-03: qty 1

## 2021-05-03 NOTE — Progress Notes (Signed)
PROGRESS NOTE    Sharon Lawrence  OJJ:[id number] DOB: 02/11/1966 DOA: 04/22/2021 PCP: Lindell Spar, MD    Brief Narrative:  56 year old with history of coronary artery disease, STEMI in 2019, celiac disease, [name] cirrhosis and GI bleeding, chronic pain syndrome presented with generalized weakness, nausea vomiting and abdominal pain.  She was admitted to intensive care unit with initial low blood pressures on vasopressors.  SBP was ruled out with negative peritoneal fluid studies.  Urine culture showed yeast.  Underwent MRI of the liver that showed cirrhosis, hematomas and moderate ascites with anasarca.  Patient had extensive weakness, failure to thrive, decreased oral intake and encephalopathy from hyperammonemia.  She had episodes of hypotension responding to IV fluids.  Remains in the hospital due to altered mental status, further medical work-up.   Assessment & Plan:   Shock: Likely hypovolemic shock.  Urine cultures with yeast, treated with Diflucan.  Peritoneal fluid negative.  Weaned off vasopressors.  Completed course of IV Rocephin.  Blood pressures adequate now. She is expected to have low normal blood pressure, on midodrine.  Decompensated cirrhosis with ascites and history of NASH/hepatic and Metabolic encephalopathy. Work-up negative for autoimmune hepatitis and viral hepatitis.  Progressive cirrhosis and portal hypertension. MRI of the liver consistent with hepatic lesions,  Patient underwent core liver biopsy and liver lesion biopsy today.   Remains on lactulose, had 3 bowel movements last 24 hours with improvement of mentation. Remains on rifaximin.  Acute on chronic microcytic anemia, acute thrombocytopenia in the setting of liver cirrhosis and history of GI bleeding: Received 2 units of PRBC with improvement of hemoglobin.  Platelets are stable. Patient currently remains on vitamin K injection x3 days INR 1.8.  Recheck tomorrow.  Hypokalemia: Replaced.  Recheck  tomorrow.  Failure to thrive/moderate malnutrition/advanced liver disease: With multiple issues and advanced liver disease, patient has poor prognosis and outcome. She has been bedbound with poor mobility for long time now. Seen by gastroenterology, prognosis is poor.  Family wanting to have a diagnosis before deciding palliation. Will wait for liver biopsy, however given patient has negative findings for liver cancer, given her profound debility, patient will benefit with home hospice.  I discussed in detail with patient's sister at the bedside and recommended that we should have a conversation about home hospice.  Patient lives with her sister and she want to stay home with her sister if enrolled into hospice.   DVT prophylaxis: Place and maintain sequential compression device Start: 04/22/21 2229   Code Status: DNR Family Communication: Sister at the bedside. Disposition Plan: Status is: Inpatient Remains inpatient appropriate because: Altered mental status, inpatient procedures planned             Consultants:  Gastroenterology Palliative  Procedures:  Paracentesis Liver biopsy 3/2  Antimicrobials:  Completed therapy.  Currently on rifaximin.   Subjective:  Seen and examined.  Slept well last night.  Blood pressures adequate.  Patient tells me that she was able to tolerate Lyrica well. She does have generalized painful condition and will need palliation, we discussed about trying low-dose oxycodone and patient agrees.  Objective: Vitals:   05/03/21 1033 05/03/21 1035 05/03/21 1040 05/03/21 1153  BP: 97/62 99/60 (!) 93/59 (!) 91/47  Pulse:  (!) 105 (!) 103 98  Resp:  12 12 16   Temp:    98.3 F (36.8 C)  TempSrc:    Oral  SpO2:  95% 96% 96%  Weight:      Height:  Intake/Output Summary (Last 24 hours) at 05/03/2021 1335 Last data filed at 05/02/2021 2000 Gross per 24 hour  Intake --  Output 250 ml  Net -250 ml   Filed Weights   04/22/21 1410   Weight: 82.3 kg    Examination:  General exam: Chronically sick looking.  Frail and debilitated.  Icteric.  On room air.  Ecchymotic. Respiratory system: Clear to auscultation. Respiratory effort normal. Cardiovascular system: S1 & S2 heard, RRR.  1+ bilateral pedal edema. Gastrointestinal system: Distended obese and pendulous.  Nontender.  Bowel sound present. Central nervous system: Alert and oriented. No focal neurological deficits.  Generalized weakness. Extremities: Symmetric 5 x 5 power.     Data Reviewed: I have personally reviewed following labs and imaging studies  CBC: Recent Labs  Lab 04/27/21 0438 04/28/21 0101 04/29/21 0242 05/01/21 0502 05/03/21 0345  WBC 11.0* 14.0* 13.8* 18.2* 19.4*  NEUTROABS 7.7 9.8* 8.9* 11.4*  --   HGB 7.2* 9.2* 9.2* 8.1* 7.5*  HCT 20.7* 25.8* 25.6* 22.4* 21.9*  MCV 81.8 79.9* 79.3* 80.9 82.0  PLT 98* 97* 110* 120* 482*   Basic Metabolic Panel: Recent Labs  Lab 04/28/21 0101 04/29/21 0242 04/30/21 1605 05/01/21 0502 05/02/21 0348  NA 136 134* 133* 133* 131*  K 3.9 3.7 3.5 3.6 3.2*  CL 105 104 102 103 102  CO2 18* 20* 21* 18* 20*  GLUCOSE 111* 119* 96 87 95  BUN 16 19 16 14 15   CREATININE 0.83 0.81 0.75 0.92 0.98  CALCIUM 8.2* 8.3* 8.2* 8.3* 8.0*   GFR: Estimated Creatinine Clearance: 63.1 mL/min (by C-G formula based on SCr of 0.98 mg/dL). Liver Function Tests: Recent Labs  Lab 04/27/21 0438 04/28/21 0101 04/29/21 0242 05/01/21 0502 05/02/21 0348  AST 52* 48* 55* 101* 90*  ALT 16 16 17  32 31  ALKPHOS 109 116 120 169* 148*  BILITOT 6.7* 8.6* 6.6* 5.6* 4.8*  PROT 4.9* 5.1* 5.1* 5.2* 4.7*  ALBUMIN 2.3* 2.4* 2.5* 2.4* 2.0*   No results for input(s): LIPASE, AMYLASE in the last 168 hours. Recent Labs  Lab 04/26/21 1510 04/30/21 1437 05/01/21 0502  AMMONIA 24 82* 56*   Coagulation Profile: Recent Labs  Lab 05/02/21 0348 05/03/21 0345  INR 1.8* 1.7*   Cardiac Enzymes: No results for input(s): CKTOTAL,  CKMB, CKMBINDEX, TROPONINI in the last 168 hours. BNP (last 3 results) No results for input(s): PROBNP in the last 8760 hours. HbA1C: No results for input(s): HGBA1C in the last 72 hours. CBG: Recent Labs  Lab 04/30/21 2013  GLUCAP 137*   Lipid Profile: No results for input(s): CHOL, HDL, LDLCALC, TRIG, CHOLHDL, LDLDIRECT in the last 72 hours. Thyroid Function Tests: No results for input(s): TSH, T4TOTAL, FREET4, T3FREE, THYROIDAB in the last 72 hours. Anemia Panel: No results for input(s): VITAMINB12, FOLATE, FERRITIN, TIBC, IRON, RETICCTPCT in the last 72 hours. Sepsis Labs: Recent Labs  Lab 05/01/21 0502 05/02/21 0348 05/03/21 0345  PROCALCITON 0.59 0.81 0.90    Recent Results (from the past 240 hour(s))  Peritoneal fluid culture w Gram Stain     Status: None   Collection Time: 04/23/21  4:49 PM   Specimen: Peritoneal Washings; Peritoneal Fluid  Result Value Ref Range Status   Specimen Description PERITONEAL  Final   Special Requests NONE  Final   Gram Stain   Final    FEW WBC PRESENT, PREDOMINANTLY MONONUCLEAR NO ORGANISMS SEEN    Culture   Final    NO GROWTH 3 DAYS Performed at  [facility], [place] [address] [place], [place] [address]    Report Status 04/26/2021 FINAL  Final         Radiology Studies: CT HEAD WO CONTRAST (5MM)  Result Date: 05/01/2021 CLINICAL DATA:  Altered mental status. EXAM: CT HEAD WITHOUT CONTRAST TECHNIQUE: Contiguous axial images were obtained from the base of the skull through the vertex without intravenous contrast. RADIATION DOSE REDUCTION: This exam was performed according to the departmental dose-optimization program which includes automated exposure control, adjustment of the mA and/or kV according to patient size and/or use of iterative reconstruction technique. COMPARISON:  None. FINDINGS: Brain: Mild diffuse cortical atrophy is noted. No evidence of acute infarction, hemorrhage, hydrocephalus, extra-axial collection or  mass lesion/mass effect. Vascular: No hyperdense vessel or unexpected calcification. Skull: Normal. Negative for fracture or focal lesion. Sinuses/Orbits: No acute finding. Other: None. IMPRESSION: Mild diffuse cortical atrophy. No acute intracranial abnormality seen. Electronically Signed   By: Marijo Conception M.D.   On: 05/01/2021 15:59   US BIOPSY (LIVER)  Result Date: 05/03/2021 INDICATION: 55 year old woman with history of cirrhosis presents to IR for ultrasound-guided random liver biopsy. EXAM: Ultrasound-guided random liver biopsy MEDICATIONS: None. ANESTHESIA/SEDATION: None COMPLICATIONS: None immediate. PROCEDURE: Informed written consent was obtained from the patient after a thorough discussion of the procedural risks, benefits and alternatives. All questions were addressed. Maximal Sterile Barrier Technique was utilized including caps, mask, sterile gowns, sterile gloves, sterile drape, hand hygiene and skin antiseptic. A timeout was performed prior to the initiation of the procedure. Patient position supine on the ultrasound table. Epigastric skin prepped and draped in usual sterile fashion. Following local lidocaine administration, 17 gauge introducer needle was advanced into the left liver lobe, and three 18 gauge cores were obtained utilizing continuous ultrasound guidance. Gelfoam slurry was administered through the introducer needle at the biopsy site. Samples were sent to pathology in formalin. Needle removed and hemostasis achieved with 5 minutes of manual compression. Post procedure ultrasound images showed no evidence of significant hemorrhage. IMPRESSION: Ultrasound-guided random biopsy of the left liver lobe. Electronically Signed   By: Miachel Roux M.D.   On: 05/03/2021 11:43   US BIOPSY (LIVER)  Result Date: 05/03/2021 INDICATION: 56 year old woman with history of cirrhosis and liver lesions presents to IR for left liver mass biopsy EXAM: Ultrasound-guided biopsy of left liver lesion  MEDICATIONS: None. ANESTHESIA/SEDATION: Moderate (conscious) sedation was employed during this procedure. A total of Versed 0.5 mg and Fentanyl 25 mcg was administered intravenously by the radiology nurse. Total intra-service moderate Sedation Time: 20 minutes. The patient's level of consciousness and vital signs were monitored continuously by radiology nursing throughout the procedure under my direct supervision. COMPLICATIONS: None immediate. PROCEDURE: Informed written consent was obtained from the patient after a thorough discussion of the procedural risks, benefits and alternatives. All questions were addressed. Maximal Sterile Barrier Technique was utilized including caps, mask, sterile gowns, sterile gloves, sterile drape, hand hygiene and skin antiseptic. A timeout was performed prior to the initiation of the procedure. Patient position supine on the ultrasound table. Epigastric skin prepped and draped in usual sterile fashion. Following local lidocaine administration, 17 gauge introducer needle was advanced into 1 of the new left liver lesions, and three 18 gauge cores were obtained utilizing continuous ultrasound guidance. Samples were sent to pathology in formalin. Needle removed and hemostasis achieved with 5 minutes of manual compression. Post procedure ultrasound images showed no evidence of significant hemorrhage. IMPRESSION: Ultrasound-guided biopsy of left liver lesion as above.  Electronically Signed   By: Miachel Roux M.D.   On: 05/03/2021 11:42   US Paracentesis  Result Date: 05/03/2021 INDICATION: 56 year old woman with recurrent ascites presents to IR for ultrasound-guided liver biopsy. Paracentesis was performed to decrease chance of hemorrhage from liver biopsy. EXAM: ULTRASOUND GUIDED  PARACENTESIS MEDICATIONS: None. COMPLICATIONS: None immediate. PROCEDURE: Informed written consent was obtained from the patient after a discussion of the risks, benefits and alternatives to treatment. A  timeout was performed prior to the initiation of the procedure. Initial ultrasound scanning demonstrates a mild amount of ascites within the right upper quadrant. The right upper abdomen was prepped and draped in the usual sterile fashion. 1% lidocaine was used for local anesthesia. Following this, a 19 gauge, 7-cm, Yueh catheter was introduced utilizing continuous ultrasound guidance. An ultrasound image was saved for documentation purposes. The paracentesis was performed. The catheter was removed and a dressing was applied. The patient tolerated the procedure well without immediate post procedural complication. FINDINGS: A total of approximately 1100 mL of straw-colored fluid was removed. IMPRESSION: Successful ultrasound-guided paracentesis yielding 1.1 liters of peritoneal fluid. Electronically Signed   By: Miachel Roux M.D.   On: 05/03/2021 11:41   DG CHEST PORT 1 VIEW  Result Date: 05/01/2021 CLINICAL DATA:  Altered mental status. EXAM: PORTABLE CHEST 1 VIEW COMPARISON:  04/23/2021 FINDINGS: Interval removal of the left IJ central venous catheter. The cardiac silhouette, mediastinal and hilar contours are within normal limits given the AP projection and portable technique. Low lung volumes with vascular crowding and bibasilar atelectasis. No definite infiltrates or effusions. The bony thorax is intact. IMPRESSION: Low lung volumes with vascular crowding and bibasilar atelectasis. Electronically Signed   By: Marijo Sanes M.D.   On: 05/01/2021 14:46        Scheduled Meds:  feeding supplement  1 Container Oral TID BM   fentaNYL       lactulose  30 g Oral TID   mouth rinse  15 mL Mouth Rinse BID   midazolam       midodrine  15 mg Oral TID WC   multivitamin with minerals  1 tablet Oral Daily   pantoprazole  40 mg Oral QHS   potassium chloride  40 mEq Oral BID   pregabalin  25 mg Oral BID   rifaximin  550 mg Oral BID   sertraline  25 mg Oral QPC breakfast   Continuous Infusions:  sodium  chloride Stopped (04/23/21 0504)     LOS: 11 days    Time spent: 35 minutes    Barb Merino, MD [facility] Pager [phone]

## 2021-05-03 NOTE — Procedures (Signed)
Interventional Radiology Procedure Note ? ?Procedure: 1. Paracentesis 2. US guided random liver biopsy 3. US guided left liver lesion biopsy ? ?Indication: Ascites. Cirrhosis. Liver lesions. ? ?Findings: Please refer to procedural dictation for full description. ? ?Complications: None ? ?EBL: < 10 mL ? ?Miachel Roux, MD ?[phone] ? ? ?

## 2021-05-03 NOTE — Consult Note (Signed)
This is late documentation for note on 05/03/20. NC1. Made no substantive changes to body of the note.    [facility] New Face-to-Face Psychiatric Evaluation   Service Date: May 03, 2021 LOS: 11  Assessment  Sharon Lawrence is a 56 y.o. female admitted medically for 04/22/2021  2:13 PM for Sepsis [facility]). She carries the psychiatric diagnoses of MDD, GAD, panic disorder, alcohol abuse in remission, SA (choking herself as teen) without hospitalization and has a past medical history of CAD, Celiac disease, cirrhosis 2/2 previously presumed NASH, CAD, STEMI 2019.  Psychiatry was consulted for depression evaluation by Barb Merino, MD.  Her initial presentation of guilt, hopelessness, hypersomnia, decreased appetite, decreased energy, apathy, anhedonia since 7-8 months ago is most consistent with MDD, although likely contributed to by worsening of liver failure.  She also reported symptoms of excessive worrying and feeling overwhelmed because of sleep disturbances, muscle tension, fatigue, irritability, consistent with GAD.  She also reported being fearful of crowds and having panic attacks, will inquire further when patient is more alert to assess for possible agoraphobia and panic disorder (would not change management). She had mild delirium on exam 2/27 with deficits largely in maintaining attention. She was started on sertraline 25 mg qD by psychiatry after initial visit; is on olanzapine per GI (offlabel for nausea). She has consistently expressed that maintaining alertness is most important to her.   We recommend continuing low-dose Zoloft for MDD and GAD and titrating up as tolerated, while monitoring for hyponatremia and GI symptoms.   On re-evaluation 3/2, pt with reported improvement in anxiety, worry, rumination. Reports she is not able to get PRN olanzapine for nausea frequently enough, will inc to TID PRN.   Please see plan below for detailed recommendations.  Diagnoses:   Principal Problem:   Acute sepsis ([place]) Active Problems:   Benign essential hypertension   Hypokalemia   Thrombocytopenia (HCC)   Pressure injury of skin   Moderate episode of recurrent major depressive disorder (HCC)   Malnutrition of moderate degree   Liver failure (HCC)   Ascites   Hepatic cirrhosis (HCC)   Encephalopathy, hepatic   Mass of multiple sites of liver  Plan  ## Safety and Observation Level:  - Based on my clinical evaluation, I estimate the patient to be at low risk of self harm in the current setting - At this time, we recommend a routine level of observation. This decision is based on my review of the chart including patient's history and current presentation, interview of the patient, mental status examination, and consideration of suicide risk including evaluating suicidal ideation, plan, intent, suicidal or self-harm behaviors, risk factors, and protective factors. This judgment is based on our ability to directly address suicide risk, implement suicide prevention strategies and develop a safety plan while the patient is in the clinical setting. Please contact our team if there is a concern that risk level has changed.   ## Medications:  -- continue Zoloft 25 mg daily, with plans to titrate up as tolerated -- decrease olanzapine from 5 BID to 5 QHS and 2.5 TID PRN.   ## Medical Decision Making Capacity:  -- Not formally assessed. Mild delirium on exam but benefited from questions being explained multiple times in different ways; would utilize teachbacks for major decisions.   ## Further Work-up:  -- Per primary team  -- Most recent EKG on 2/21 had QTc 505, HR 97 (qtc 467 using fredericia correction, no need to stop antipsychotic)  --  cannot review EKG 2/27  ## Disposition:  -- Per primary team pending clinical improvement  Thank you for this consult request. Recommendations have been communicated to the primary team.  We will follow at this time.    Spencer A Bryona Foxworthy  NEW history  Relevant Aspects of Hospital Course:  Admitted on 04/22/2021 for Sepsis [facility]).  Patient Report:  Patient father is in the room this AM during assessment (consents to him remaining). Patient reports that she has recently returned from liver biopsy. Patient reports that her nausea has continued to be rough. Patietn reports she has been unable to get her Zyprexa as often as she thinks she needs it. Patient endorses that she would prefer TID PRN - still feels alertness is most important and would choose "nauseous but awake" to "asleep and comfortable". Patient reports that she is overall feeling less anxious and scared, but "still a little panicky but it's not as bad."   Patient and father reports that the liver biopsy was to check for cancer as patient was found to have lesions - does not seem to understand remains quite ill even if no malignant cells found on biopsy. The biopsy was also to stage cirrhosis. Patient is AO place, month,  not date, and situation. Patient struggles a bit with DOWB; however she did recently receive Versed for the biopsy. Patient denies SI and HI. Patient endorses that she is seeing "smoke" wisp out the corner of her eye, but when she focuses she does not see them. Patient reports that these instances scared her initially but not anymore.   DOWB was a struggle but able to perform with no errors    ROS:  Confusion, sedation  Please see initial consult note for psychiatric, social  history.   Family History:  The patient's family history includes Anxiety disorder in her sister; Bipolar disorder in her son; CAD in her maternal grandfather and paternal grandmother; Cancer in her mother and sister; Cirrhosis in her paternal grandfather; Depression in her sister; Diabetes in her paternal grandmother; Drug abuse in her son; Hypertension in her father and mother; Hypothyroidism in her father and paternal grandmother.  Medical  History: Past Medical History:  Diagnosis Date   Acute ST elevation myocardial infarction (STEMI) of inferior wall ([place]) 09/15/2017   ADHD (attention deficit hyperactivity disorder)    Anxiety    B12 deficiency 02/07/2011   Back pain    CAD (coronary artery disease)    a. s/p recent STEMI on 09/15/2017 with DES to RCA   Celiac disease    Folic acid deficiency 42/59/5638   GI bleed 11/06/2017   Headache    Hepatic cirrhosis ([place])    History of ST elevation myocardial infarction (STEMI) 11/06/2017   Iron deficiency anemia 02/07/2011   Myocardial infarction ([place]) 09/15/2017   Syncope and collapse 11/06/2017    Surgical History: Past Surgical History:  Procedure Laterality Date   BIOPSY  07/12/2020   Procedure: BIOPSY;  Surgeon: Rogene Houston, MD;  Location[facility];  Service: Endoscopy;;  duodenum(2nd part);antral   CHOLECYSTECTOMY     COLONOSCOPY WITH PROPOFOL N/A 09/26/2017   rehman examined portion of ileum normal, - Five small, non-bleeding polyps in the rectum, in the sigmoid colon and at the hepatic   CORONARY STENT INTERVENTION Right 09/15/2017   Procedure: CORONARY STENT INTERVENTION;  Surgeon: Jettie Booze, MD;  Location: [facility];  Service: Cardiovascular;  Laterality: Right;  RCA   ESOPHAGOGASTRODUODENOSCOPY (EGD) WITH PROPOFOL N/A  07/12/2020   rehman: normal hypopharynx, Normal esophagus. z line regular 35 cm from incisors, congested and erythematous mucosa in antrum, mucosal changes in duodenum, very pronounced changes to suggest active celiac disease   KNEE CARTILAGE SURGERY Right    LEFT HEART CATH AND CORONARY ANGIOGRAPHY N/A 09/15/2017   Procedure: LEFT HEART CATH AND CORONARY ANGIOGRAPHY;  Surgeon: Jettie Booze, MD;  Location: [facility];  Service: Cardiovascular;  Laterality: N/A;   TONSILLECTOMY      Medications:   Current Facility-Administered Medications:    0.9 %  sodium chloride infusion, , Intravenous, PRN,  Margaretha Seeds, MD, Paused at 04/23/21 8182   acetaminophen (TYLENOL) tablet 650 mg, 650 mg, Oral, Q6H PRN, Jacky Kindle, MD, 650 mg at 05/01/21 2004   feeding supplement (BOOST / RESOURCE BREEZE) liquid 1 Container, 1 Container, Oral, TID BM, Hosie Poisson, MD, 1 Container at 05/02/21 2127   fentaNYL (SUBLIMAZE) 100 MCG/2ML injection, , , ,    ketorolac (TORADOL) 15 MG/ML injection 15 mg, 15 mg, Intravenous, Q12H PRN, Barb Merino, MD, 15 mg at 05/02/21 1257   lactulose (CHRONULAC) 10 GM/15ML solution 30 g, 30 g, Oral, TID, Hosie Poisson, MD, 30 g at 05/02/21 2128   MEDLINE mouth rinse, 15 mL, Mouth Rinse, BID, Chand, Sudham, MD, 15 mL at 05/03/21 1114   midazolam (VERSED) 2 MG/2ML injection, , , ,    midodrine (PROAMATINE) tablet 15 mg, 15 mg, Oral, TID WC, Jose Persia, MD, 15 mg at 05/03/21 1113   multivitamin with minerals tablet 1 tablet, 1 tablet, Oral, Daily, Chand, Currie Paris, MD, 1 tablet at 05/03/21 1113   oxyCODONE (Oxy IR/ROXICODONE) immediate release tablet 2.5 mg, 2.5 mg, Oral, Q6H PRN, Barb Merino, MD   pantoprazole (PROTONIX) EC tablet 40 mg, 40 mg, Oral, QHS, Chand, Sudham, MD, 40 mg at 05/02/21 2128   potassium chloride SA (KLOR-CON M) CR tablet 40 mEq, 40 mEq, Oral, BID, Ghimire, Kuber, MD, 40 mEq at 05/03/21 1113   pregabalin (LYRICA) capsule 25 mg, 25 mg, Oral, BID, Ghimire, Kuber, MD, 25 mg at 05/03/21 1113   rifaximin (XIFAXAN) tablet 550 mg, 550 mg, Oral, BID, Vena Rua, PA-C, 550 mg at 05/03/21 1114   [COMPLETED] sertraline (ZOLOFT) tablet 25 mg, 25 mg, Oral, QPC lunch, 25 mg at 04/27/21 1138 **FOLLOWED BY** sertraline (ZOLOFT) tablet 25 mg, 25 mg, Oral, QPC breakfast, Merrily Brittle, DO, 25 mg at 05/03/21 1113   sodium chloride (OCEAN) 0.65 % nasal spray 1 spray, 1 spray, Each Nare, PRN, Jacky Kindle, MD  Allergies: Allergies  Allergen Reactions   Bee Venom Anaphylaxis   Erythromycin Anaphylaxis   Cephalexin    Neomycin-Bacitracin-Polymyxin   [Bacitracin-Neomycin-Polymyxin] Swelling    Sight of application   Neosporin Original [Bacitracin-Neomycin-Polymyxin] Swelling    Swelling at site of application   Prednisone    Sulfa Antibiotics Swelling    Sulfa eye drops caused eyes to swell Sulfa eye drops caused eyes to swell   Tobrex [Tobramycin] Swelling   Tramadol Hives   Penicillins Rash    Has patient had a PCN reaction causing immediate rash, facial/tongue/throat swelling, SOB or lightheadedness with hypotension: Yes Has patient had a PCN reaction causing severe rash involving mucus membranes or skin necrosis: No Has patient had a PCN reaction that required hospitalization: No Has patient had a PCN reaction occurring within the last 10 years: No If all of the above answers are "NO", then may proceed with Cephalosporin use.     Objective  Psychiatric Specialty Exam: Physical Exam Vitals and nursing note reviewed.  Constitutional:      General: She is not in acute distress.    Appearance: She is ill-appearing. She is not diaphoretic.     Comments: jaundiced  HENT:     Head: Normocephalic.  Pulmonary:     Effort: No respiratory distress.  Musculoskeletal:     Comments: Decreased strength b/l extremities, difficulty raising arms.   Neurological:     General: No focal deficit present.     Mental Status: She is lethargic.     Review of Systems  Constitutional:  Positive for malaise/fatigue.  Respiratory:  Positive for shortness of breath.   Gastrointestinal:  Negative for nausea.  Psychiatric/Behavioral:         Illusions    Body mass index is 35.43 kg/m. Temp:  [97.7 F (36.5 C)-98.5 F (36.9 C)] 98.3 F (36.8 C) (03/02 1153) Pulse Rate:  [88-106] 98 (03/02 1153) Cardiac Rhythm: Sinus tachycardia (03/02 1040) Resp:  [10-16] 16 (03/02 1153) BP: (83-116)/(47-73) 91/47 (03/02 1153) SpO2:  [94 %-98 %] 96 % (03/02 1153)  General Appearance: Appropriate for Environment (has done nails recently)    Eye  Contact: Good    Speech: Slow (frequent delay)    Volume: Decreased    Mood: -- ("Tired")   Affect: Congruent; Depressed; Restricted    Thought Process: Coherent; Goal Directed  Descriptions of Associations: Intact  Duration of Psychotic Symptoms:No data recorded Past Diagnosis of Schizophrenia or Psychoactive disorder:No data recorded  Orientation: Full (Time, Place and Person)   Thought Content: Scattered  Hallucinations: No data recorded   Ideas of Reference: None   Suicidal Thoughts: No  No data recorded   Homicidal Thoughts: No  No data recorded    Memory: Immediate Fair; Recent Fair    Judgement: Fair  Insight: Poor (does not seem aware of overall prognosis)    Psychomotor Activity: Decreased; Psychomotor Retardation    Concentration: Poor  Attention Span: Poor (marked deficits in conversation. often answers question from 1-2 minutes ago and not issue at hand. did poorly on both SAVEAHAART and DOWB.)   Recall: [employer] of Knowledge: Fair    Language: Good    Handed: Right    Assets: Leisure Time; Social Support    Sleep: Fair     AIMS:   , ,  ,  ,     CIWA:  COWS:      Signed: [place] [facility] Resident, PGY-1 [facility] 05/03/2021, 2:23 PM

## 2021-05-03 NOTE — Consult Note (Incomplete)
Patient father is in the room this AM during assessment. Patient reports that she has recently returned from liver biopsy. Patient reports that her nausea has continued to be rough. Patietn reports she was unaware she has not been getting her Zyprexa as often as she thinks she needs it. Patient endorses that she would prefer TID scheduling. Patient reports that she is overall feeling less anxious and scared, but "still a little panicky but it's not as bad."  Patient and father reports that the liver biopsy was to check for cancer as patient was found to have lesions. The biopsy was also to stage cirrhosis. Patient is AO place, month,  not date, and situation. Patient struggles a bit with DOWB; however she did recently receive Versed for the biopsy. Patient denies SI and HI. Patient endorses that she is seeing "smoke" wisp out the corner of her eye, but when she focuses she does not see them. Patient reports that these instances due scare her a bit.    We are attempting to balance patients nausea with her sedation. However, patient reports that she is more concerned about her nausea a this time; therefore we will increase Zyprexa to TID. Patient overall appears less anxious and more able to be involved with her care and interact with staff and family.    Physical - Decreased strength in BUE 2/5

## 2021-05-03 NOTE — Progress Notes (Signed)
PT Cancellation Note ? ?Patient Details ?Name: Sharon Lawrence ?MRN: [id number] ?DOB: 06/14/1965 ? ? ?Cancelled Treatment:    Reason Eval/Treat Not Completed: Other (comment) (Pt down for test. Will check back at later date.) ? ? ?Alvira Philips ?05/03/2021, 10:31 AM ?Arrie Aran M,PT ?Acute Rehab Services ?[phone] ?[phone] (pager)  ?

## 2021-05-03 NOTE — Progress Notes (Signed)
OT Cancellation Note ? ?Patient Details ?Name: Sharon Lawrence ?MRN: [id number] ?DOB: 27-Aug-1965 ? ? ?Cancelled Treatment:    Reason Eval/Treat Not Completed: Patient at procedure or test/ unavailable. Plan to reattempt at a later date. ? ?Tyrone Schimke, OT ?Acute Rehabilitation Services ?Office: [phone] ? ?05/03/2021, 10:41 AM ?

## 2021-05-04 DIAGNOSIS — E440 Moderate protein-calorie malnutrition: Secondary | ICD-10-CM

## 2021-05-04 LAB — CBC WITH DIFFERENTIAL/PLATELET
Abs Immature Granulocytes: 0.4 10*3/uL — ABNORMAL HIGH (ref 0.00–0.07)
Basophils Absolute: 0.1 10*3/uL (ref 0.0–0.1)
Basophils Relative: 0 %
Eosinophils Absolute: 0 10*3/uL (ref 0.0–0.5)
Eosinophils Relative: 0 %
HCT: 24.1 % — ABNORMAL LOW (ref 36.0–46.0)
Hemoglobin: 8 g/dL — ABNORMAL LOW (ref 12.0–15.0)
Immature Granulocytes: 2 %
Lymphocytes Relative: 21 %
Lymphs Abs: 4.2 10*3/uL — ABNORMAL HIGH (ref 0.7–4.0)
MCH: 27.7 pg (ref 26.0–34.0)
MCHC: 33.2 g/dL (ref 30.0–36.0)
MCV: 83.4 fL (ref 80.0–100.0)
Monocytes Absolute: 2.1 10*3/uL — ABNORMAL HIGH (ref 0.1–1.0)
Monocytes Relative: 11 %
Neutro Abs: 13.2 10*3/uL — ABNORMAL HIGH (ref 1.7–7.7)
Neutrophils Relative %: 66 %
Platelets: 190 10*3/uL (ref 150–400)
RBC: 2.89 MIL/uL — ABNORMAL LOW (ref 3.87–5.11)
RDW: 19.2 % — ABNORMAL HIGH (ref 11.5–15.5)
WBC: 20 10*3/uL — ABNORMAL HIGH (ref 4.0–10.5)
nRBC: 0.1 % (ref 0.0–0.2)

## 2021-05-04 LAB — MAGNESIUM: Magnesium: 2.4 mg/dL (ref 1.7–2.4)

## 2021-05-04 LAB — COMPREHENSIVE METABOLIC PANEL
ALT: 42 U/L (ref 0–44)
AST: 115 U/L — ABNORMAL HIGH (ref 15–41)
Albumin: 2.2 g/dL — ABNORMAL LOW (ref 3.5–5.0)
Alkaline Phosphatase: 262 U/L — ABNORMAL HIGH (ref 38–126)
Anion gap: 7 (ref 5–15)
BUN: 17 mg/dL (ref 6–20)
CO2: 19 mmol/L — ABNORMAL LOW (ref 22–32)
Calcium: 7.7 mg/dL — ABNORMAL LOW (ref 8.9–10.3)
Chloride: 101 mmol/L (ref 98–111)
Creatinine, Ser: 0.89 mg/dL (ref 0.44–1.00)
GFR, Estimated: >60 mL/min (ref >60)
Glucose, Bld: 106 mg/dL — ABNORMAL HIGH (ref 70–99)
Potassium: 5.1 mmol/L (ref 3.5–5.1)
Sodium: 127 mmol/L — ABNORMAL LOW (ref 135–145)
Total Bilirubin: 4.9 mg/dL — ABNORMAL HIGH (ref 0.3–1.2)
Total Protein: 4.9 g/dL — ABNORMAL LOW (ref 6.5–8.1)

## 2021-05-04 LAB — PHOSPHORUS: Phosphorus: 2.5 mg/dL (ref 2.5–4.6)

## 2021-05-04 MED ORDER — PROCHLORPERAZINE EDISYLATE 10 MG/2ML IJ SOLN
5.0000 mg | Freq: Once | INTRAMUSCULAR | Status: AC | PRN
  Administered 2021-05-05: 5 mg via INTRAVENOUS
  Filled 2021-05-04: qty 2

## 2021-05-04 MED ORDER — ONDANSETRON 4 MG PO TBDP
4.0000 mg | ORAL_TABLET | Freq: Three times a day (TID) | ORAL | Status: DC | PRN
  Administered 2021-05-04 (×2): 4 mg via ORAL
  Filled 2021-05-04 (×2): qty 1

## 2021-05-04 NOTE — Progress Notes (Signed)
? ?  Chart reviewed. ? ?I have no further management suggestions now. ? ?Hopefully liver biopsy results will help patient to understand her situation better. I think, regardless of carcinoma or not she has a very poor prognosis and hospice is sensible. ? ?Please call us back if you need [place]. ?Signing off. ? ?Gatha Mayer, MD, Marval Regal ?[facility] ?05/04/2021 3:11 PM ? ?

## 2021-05-04 NOTE — TOC Progression Note (Signed)
Transition of Care (TOC) - Progression Note  ? ? ?Patient Details  ?Name: Sharon Lawrence ?MRN: [id number] ?Date of Birth: 1965/12/11 ? ?Transition of Care (TOC) CM/SW Contact  ?Verdell Carmine, RN ?Phone Number: ?05/04/2021, 3:12 PM ? ?Clinical Narrative:    ?MD had messaged this RNCM regarding  that sister has stated she may have insurance. In chart it looks like Medicaid may be active. FC sent message to verify. Called sister [name] to discuss. She states they applied 4 days ago, but she has not had an email back confirming.  ?Patient will be hospice or HH depending on biopsy result according to sister. She reiterates that discussion with palliative care should wait until the biopsy results are determined. She vocalized being upset with some of care, and frustration at  things that maybe could have been done earlier.  Nevertheless, the patient plan is still unsure. If she does have Medicaid, it is still doubtful that [facility] will be secured.  ?Sent patient information to charity provider  [facility], for consideration of PT OT and aide.  ? ? ? ?Expected Discharge Plan: [place] ?Barriers to Discharge: Continued Medical Work up ? ?Expected Discharge Plan and Services ?Expected Discharge Plan: [place] ?  ?Discharge Planning Services: CM Consult ?Post Acute Care Choice: Home Health ?Living arrangements for the past 2 months: [place] ?                ?DME Arranged: N/A ?DME Agency: NA ?  ?  ?  ?HH Arranged: PT, OT ?[place] Agency: [facility] ([place]) ?Date HH Agency Contacted: 04/25/21 ?Time [place]: 8333 ?Representative spoke with at [place]: [place]- Will Administrator, arts for approval. ? ? ?Social Determinants of Health (SDOH) Interventions ?  ? ?Readmission Risk Interventions ?No flowsheet data found. ? ?

## 2021-05-04 NOTE — Progress Notes (Signed)
2353: Patient complaining of nausea. Patient and sister states she takes 8 mg Zofran ODT at home. Received 62m at 1900. Paged on-call provider and received order for Compazine. Patient with history of prolonged QT. Will need EKG if still nauseous outside of available meds. ?

## 2021-05-04 NOTE — Progress Notes (Signed)
Pt had some difficulty with PO potassium with water and pudding. RT notified, assessed, and recommended CXR. MD Bridgett Larsson was notified with RT's findings.  CXR ordered. Potassium was the only tablet that she had any issues with.   ? ?0540 Pt extremely drowsy post oxy administration and was mixing up some words. Rapid called to bedside for a second opinion.  MD Bridgett Larsson notified. ? ? ?

## 2021-05-04 NOTE — Progress Notes (Signed)
PROGRESS NOTE    EVERLYN FARABAUGH  RJJ:[id number] DOB: 1965/05/06 DOA: 04/22/2021 PCP: Lindell Spar, MD    Brief Narrative:  56 year old with history of coronary artery disease, STEMI in 2019, celiac disease, [name] cirrhosis and GI bleeding, chronic pain syndrome presented with generalized weakness, nausea vomiting and abdominal pain.  She was admitted to intensive care unit with initial low blood pressures on vasopressors.  SBP was ruled out with negative peritoneal fluid studies.  Urine culture showed yeast.  Underwent MRI of the liver that showed cirrhosis, hematomas and moderate ascites with anasarca.  Patient had extensive weakness, failure to thrive, decreased oral intake and encephalopathy from hyperammonemia.  She had episodes of hypotension responding to IV fluids.  Remains in the hospital due to altered mental status, further medical work-up.   Assessment & Plan:   Shock: Likely hypovolemic shock.  Urine cultures with yeast, treated with Diflucan.  Peritoneal fluid negative.  Weaned off vasopressors.  Completed course of IV Rocephin.  Blood pressures adequate now. She is expected to have low normal blood pressure, on midodrine.  Patient does have leukocytosis with no evidence of bacterial infection.  Will monitor.  Decompensated cirrhosis with ascites and history of NASH/hepatic and Metabolic encephalopathy. Work-up negative for autoimmune hepatitis and viral hepatitis.  Progressive cirrhosis and portal hypertension. MRI of the liver consistent with hepatic lesions,  Patient underwent core liver biopsy and liver lesion biopsy 3/2, results are pending.  Paracentesis and cytopathology pending.  On lactulose and rifaximin. INR is stable.  Acute on chronic microcytic anemia, acute thrombocytopenia in the setting of liver cirrhosis and history of GI bleeding: Received 2 units of PRBC with improvement of hemoglobin.  Platelets are stable. Patient currently remains on vitamin K injection x3  days  Failure to thrive/moderate malnutrition/advanced liver disease: With multiple issues and advanced liver disease, patient has poor prognosis and outcome. She has been bedbound with poor mobility for long time now. Seen by gastroenterology, prognosis is poor.  Family wanting to have a diagnosis before deciding palliation. Patient and sister willing to go home with possible home health PT, if unable open with idea of hospice but wants to wait for oral intake and pain control.  I discussed in detail with patient's sister at the bedside and recommended that we should have a conversation about home hospice.  They did not want to make decision until biopsy results are available.   DVT prophylaxis: Place and maintain sequential compression device Start: 04/22/21 2229   Code Status: DNR Family Communication: Sister and her friend at the bedside. Disposition Plan: Status is: Inpatient Remains inpatient appropriate because:  Inadequate oral intake, unable to mobilize.   Consultants:  Gastroenterology Palliative  Procedures:  Paracentesis Liver biopsy 3/2  Antimicrobials:  Completed therapy.  Currently on rifaximin.   Subjective:  Patient seen and examined.  More awake and following commands.  Attempted to work with physical therapy, not much success.  She could not get out of bed.  Family reports not been out of bed for more than 8 months now. Sister thinks her insurance might kicked in. Has some nausea managed with oral Zofran. She did well with low-dose of Lyrica as well as tolerated low-dose oxycodone with pain control.  Objective: Vitals:   05/03/21 2300 05/04/21 0400 05/04/21 0807 05/04/21 1144  BP: 112/79 104/65 108/78 (!) 103/57  Pulse: 93 97 98 89  Resp: 13 (!) 8 12 11   Temp: 98.8 F (37.1 C) 99.1 F (37.3 C) 97.7 F (  36.5 C) 98.4 F (36.9 C)  TempSrc: Axillary Axillary Axillary Axillary  SpO2: 94% 96% 97% 96%  Weight:      Height:       No intake or output  data in the 24 hours ending 05/04/21 1253  Filed Weights   04/22/21 1410  Weight: 82.3 kg    Examination:  General exam: Chronically sick looking.  Frail and debilitated.  Icteric.  Remains on room air. Respiratory system: Clear to auscultation. Respiratory effort normal. Cardiovascular system: S1 & S2 heard, RRR.  2+ bilateral pedal edema mostly in the dorsum of the feet. Gastrointestinal system: Distended obese and pendulous.  Nontender.  Bowel sound present. Central nervous system: Alert and oriented. No focal neurological deficits.  Generalized weakness. Extremities: Symmetric 5 x 5 power.     Data Reviewed: I have personally reviewed following labs and imaging studies  CBC: Recent Labs  Lab 04/28/21 0101 04/29/21 0242 05/01/21 0502 05/03/21 0345 05/04/21 0228  WBC 14.0* 13.8* 18.2* 19.4* 20.0*  NEUTROABS 9.8* 8.9* 11.4*  --  13.2*  HGB 9.2* 9.2* 8.1* 7.5* 8.0*  HCT 25.8* 25.6* 22.4* 21.9* 24.1*  MCV 79.9* 79.3* 80.9 82.0 83.4  PLT 97* 110* 120* 143* 619   Basic Metabolic Panel: Recent Labs  Lab 04/29/21 0242 04/30/21 1605 05/01/21 0502 05/02/21 0348 05/04/21 0228  NA 134* 133* 133* 131* 127*  K 3.7 3.5 3.6 3.2* 5.1  CL 104 102 103 102 101  CO2 20* 21* 18* 20* 19*  GLUCOSE 119* 96 87 95 106*  BUN 19 16 14 15 17   CREATININE 0.81 0.75 0.92 0.98 0.89  CALCIUM 8.3* 8.2* 8.3* 8.0* 7.7*  MG  --   --   --   --  2.4  PHOS  --   --   --   --  2.5   GFR: Estimated Creatinine Clearance: 69.5 mL/min (by C-G formula based on SCr of 0.89 mg/dL). Liver Function Tests: Recent Labs  Lab 04/28/21 0101 04/29/21 0242 05/01/21 0502 05/02/21 0348 05/04/21 0228  AST 48* 55* 101* 90* 115*  ALT 16 17 32 31 42  ALKPHOS 116 120 169* 148* 262*  BILITOT 8.6* 6.6* 5.6* 4.8* 4.9*  PROT 5.1* 5.1* 5.2* 4.7* 4.9*  ALBUMIN 2.4* 2.5* 2.4* 2.0* 2.2*   No results for input(s): LIPASE, AMYLASE in the last 168 hours. Recent Labs  Lab 04/30/21 1437 05/01/21 0502  AMMONIA 82*  56*   Coagulation Profile: Recent Labs  Lab 05/02/21 0348 05/03/21 0345  INR 1.8* 1.7*   Cardiac Enzymes: No results for input(s): CKTOTAL, CKMB, CKMBINDEX, TROPONINI in the last 168 hours. BNP (last 3 results) No results for input(s): PROBNP in the last 8760 hours. HbA1C: No results for input(s): HGBA1C in the last 72 hours. CBG: Recent Labs  Lab 04/30/21 2013  GLUCAP 137*   Lipid Profile: No results for input(s): CHOL, HDL, LDLCALC, TRIG, CHOLHDL, LDLDIRECT in the last 72 hours. Thyroid Function Tests: No results for input(s): TSH, T4TOTAL, FREET4, T3FREE, THYROIDAB in the last 72 hours. Anemia Panel: No results for input(s): VITAMINB12, FOLATE, FERRITIN, TIBC, IRON, RETICCTPCT in the last 72 hours. Sepsis Labs: Recent Labs  Lab 05/01/21 0502 05/02/21 0348 05/03/21 0345  PROCALCITON 0.59 0.81 0.90    No results found for this or any previous visit (from the past 240 hour(s)).        Radiology Studies: US BIOPSY (LIVER)  Result Date: 05/03/2021 INDICATION: 56 year old woman with history of cirrhosis presents to IR for ultrasound-guided  random liver biopsy. EXAM: Ultrasound-guided random liver biopsy MEDICATIONS: None. ANESTHESIA/SEDATION: None COMPLICATIONS: None immediate. PROCEDURE: Informed written consent was obtained from the patient after a thorough discussion of the procedural risks, benefits and alternatives. All questions were addressed. Maximal Sterile Barrier Technique was utilized including caps, mask, sterile gowns, sterile gloves, sterile drape, hand hygiene and skin antiseptic. A timeout was performed prior to the initiation of the procedure. Patient position supine on the ultrasound table. Epigastric skin prepped and draped in usual sterile fashion. Following local lidocaine administration, 17 gauge introducer needle was advanced into the left liver lobe, and three 18 gauge cores were obtained utilizing continuous ultrasound guidance. Gelfoam slurry was  administered through the introducer needle at the biopsy site. Samples were sent to pathology in formalin. Needle removed and hemostasis achieved with 5 minutes of manual compression. Post procedure ultrasound images showed no evidence of significant hemorrhage. IMPRESSION: Ultrasound-guided random biopsy of the left liver lobe. Electronically Signed   By: Miachel Roux M.D.   On: 05/03/2021 11:43   US BIOPSY (LIVER)  Result Date: 05/03/2021 INDICATION: 56 year old woman with history of cirrhosis and liver lesions presents to IR for left liver mass biopsy EXAM: Ultrasound-guided biopsy of left liver lesion MEDICATIONS: None. ANESTHESIA/SEDATION: Moderate (conscious) sedation was employed during this procedure. A total of Versed 0.5 mg and Fentanyl 25 mcg was administered intravenously by the radiology nurse. Total intra-service moderate Sedation Time: 20 minutes. The patient's level of consciousness and vital signs were monitored continuously by radiology nursing throughout the procedure under my direct supervision. COMPLICATIONS: None immediate. PROCEDURE: Informed written consent was obtained from the patient after a thorough discussion of the procedural risks, benefits and alternatives. All questions were addressed. Maximal Sterile Barrier Technique was utilized including caps, mask, sterile gowns, sterile gloves, sterile drape, hand hygiene and skin antiseptic. A timeout was performed prior to the initiation of the procedure. Patient position supine on the ultrasound table. Epigastric skin prepped and draped in usual sterile fashion. Following local lidocaine administration, 17 gauge introducer needle was advanced into 1 of the new left liver lesions, and three 18 gauge cores were obtained utilizing continuous ultrasound guidance. Samples were sent to pathology in formalin. Needle removed and hemostasis achieved with 5 minutes of manual compression. Post procedure ultrasound images showed no evidence of  significant hemorrhage. IMPRESSION: Ultrasound-guided biopsy of left liver lesion as above. Electronically Signed   By: Miachel Roux M.D.   On: 05/03/2021 11:42   US Paracentesis  Result Date: 05/03/2021 INDICATION: 56 year old woman with recurrent ascites presents to IR for ultrasound-guided liver biopsy. Paracentesis was performed to decrease chance of hemorrhage from liver biopsy. EXAM: ULTRASOUND GUIDED  PARACENTESIS MEDICATIONS: None. COMPLICATIONS: None immediate. PROCEDURE: Informed written consent was obtained from the patient after a discussion of the risks, benefits and alternatives to treatment. A timeout was performed prior to the initiation of the procedure. Initial ultrasound scanning demonstrates a mild amount of ascites within the right upper quadrant. The right upper abdomen was prepped and draped in the usual sterile fashion. 1% lidocaine was used for local anesthesia. Following this, a 19 gauge, 7-cm, Yueh catheter was introduced utilizing continuous ultrasound guidance. An ultrasound image was saved for documentation purposes. The paracentesis was performed. The catheter was removed and a dressing was applied. The patient tolerated the procedure well without immediate post procedural complication. FINDINGS: A total of approximately 1100 mL of straw-colored fluid was removed. IMPRESSION: Successful ultrasound-guided paracentesis yielding 1.1 liters of peritoneal fluid. Electronically Signed  By: Sharen Heck  Mir M.D.   On: 05/03/2021 11:41   DG CHEST PORT 1 VIEW  Result Date: 05/03/2021 CLINICAL DATA:  Choking EXAM: PORTABLE CHEST 1 VIEW COMPARISON:  05/01/2021 FINDINGS: Single frontal view of the chest demonstrates an unremarkable cardiac silhouette. Linear density at the left lung base consistent with atelectasis. No airspace disease, effusion, or pneumothorax. No acute bony abnormality. IMPRESSION: 1. Stable left basilar atelectasis.  No acute process. Electronically Signed   By: Randa Ngo M.D.   On: 05/03/2021 22:16        Scheduled Meds:  feeding supplement  1 Container Oral TID BM   lactulose  30 g Oral TID   mouth rinse  15 mL Mouth Rinse BID   midodrine  15 mg Oral TID WC   multivitamin with minerals  1 tablet Oral Daily   pantoprazole  40 mg Oral QHS   pregabalin  25 mg Oral BID   rifaximin  550 mg Oral BID   sertraline  25 mg Oral QPC breakfast   Continuous Infusions:  sodium chloride Stopped (04/23/21 0504)     LOS: 12 days    Time spent: 35 minutes    Barb Merino, MD [facility] Pager [phone]

## 2021-05-04 NOTE — Progress Notes (Addendum)
?                                                   ?Daily Progress Note  ? ?Patient Name: Sharon Lawrence       Date: 05/04/2021 ?DOB: 1965/10/13  Age: 56 y.o. MRN#: [id number] ?Attending Physician: Barb Merino, MD ?Primary Care Physician: Lindell Spar, MD ?Admit Date: 04/22/2021 ? ?Reason for Consultation/Follow-up: Establishing goals of care ? ?Subjective: ?Patient reports she is just worked with physical therapy and is feeling quite tired ? ?Length of Stay: 12 ? ?Current Medications: ?Scheduled Meds:  ? feeding supplement  1 Container Oral TID BM  ? lactulose  30 g Oral TID  ? mouth rinse  15 mL Mouth Rinse BID  ? midodrine  15 mg Oral TID WC  ? multivitamin with minerals  1 tablet Oral Daily  ? pantoprazole  40 mg Oral QHS  ? pregabalin  25 mg Oral BID  ? rifaximin  550 mg Oral BID  ? sertraline  25 mg Oral QPC breakfast  ? ? ?Continuous Infusions: ? sodium chloride Stopped (04/23/21 0504)  ? ? ?PRN Meds: ?sodium chloride, acetaminophen, ondansetron, oxyCODONE, sodium chloride ? ?Physical Exam ?Constitutional:   ?   General: She is not in acute distress. ?   Appearance: She is ill-appearing.  ?Pulmonary:  ?   Effort: Pulmonary effort is normal.  ?Skin: ?   General: Skin is warm and dry.  ?Neurological:  ?   Mental Status: She is alert and oriented to person, place, and time.  ?         ? ?Vital Signs: BP 108/78 (BP Location: Right Arm)   Pulse 98   Temp 97.7 ?F (36.5 ?C) (Axillary)   Resp 12   Ht 5' (1.524 m)   Wt 82.3 kg   SpO2 97%   BMI 35.43 kg/m?  ?SpO2: SpO2: 97 % ?O2 Device: O2 Device: Room Air ?O2 Flow Rate: O2 Flow Rate (L/min): 2 L/min ? ?Intake/output summary: No intake or output data in the 24 hours ending 05/04/21 1043 ?LBM: Last BM Date : 05/02/21 ?Baseline Weight: Weight: 82.3 kg ?Most recent weight: Weight: 82.3 kg ? ?     ?Palliative Assessment/Data: PPS  20% ? ? ? ? ? ?Patient Active Problem List  ? Diagnosis Date Noted  ? Hepatic cirrhosis ([place]) 05/01/2021  ? Encephalopathy, hepatic   ? Mass of multiple sites of liver   ? Malnutrition of moderate degree 04/30/2021  ? Liver failure ([place]) 04/30/2021  ? Moderate episode of recurrent major depressive disorder ([place])   ? Ascites   ? Pressure injury of skin 04/23/2021  ? Acute sepsis ([place]) 04/22/2021  ? Hypokalemia   ? Thrombocytopenia ([place])   ? Depression, recurrent ([place]) 01/11/2021  ? Coronary artery disease involving native coronary artery of native heart without angina pectoris 01/11/2021  ? Abnormal drug screen (10/18/2020) 12/31/2020  ? Marijuana use 12/31/2020  ? Methadone misuse 12/31/2020  ? Pain medication agreement broken (10/18/2020) 12/31/2020  ? Hypoalbuminemia due to protein-calorie malnutrition ([place]) 12/12/2020  ? Non-pressure chronic ulcer of unspecified part of right lower leg with unspecified severity ([place]) 07/11/2020  ? Chronic venous hypertension (idiopathic) with ulcer and inflammation of right lower extremity (CODE) ([place]) 07/11/2020  ? Chronic use of opiate for therapeutic purpose 06/13/2020  ? Benign neoplasm of colon  02/14/2020  ? Internal derangement of right knee 02/14/2020  ? Moderate persistent asthma with (acute) exacerbation 02/14/2020  ? Spondylosis without myelopathy or radiculopathy, lumbosacral region 12/09/2019  ? Lumbar facet synovial cyst (L5-S1) (Left) 12/09/2019  ? Generalized anxiety disorder 10/06/2018  ? Thrombocytosis 09/30/2018  ? Anemia 09/23/2018  ? Polyarthralgia 09/09/2018  ? DDD (degenerative disc disease), lumbar 12/29/2017  ? DDD (degenerative disc disease), thoracic 12/29/2017  ? Lumbar facet arthropathy (Bilateral) 12/29/2017  ? Neurogenic pain 12/29/2017  ? Chronic low back pain (Primary Area of Pain) (Bilateral) (R>L) w/ sciatica (Right) 12/03/2017  ? Chronic pain syndrome 12/03/2017  ? Long term current use of opiate analgesic 12/03/2017  ? Subclinical  hypothyroidism 11/06/2017  ? Gastroesophageal reflux disease 11/06/2017  ? Bilateral lower extremity edema 11/06/2017  ? Benign essential hypertension 11/06/2017  ? Panic disorder 11/06/2017  ? Right-sided low back pain with sciatica 11/06/2017  ? Migraine 11/06/2017  ? Iron deficiency anemia 11/06/2017  ? Morbidly obese ([place]) 09/25/2017  ? Chronic diarrhea, with Celiac disease 09/25/2017  ? Former smoker   ? Hyperlipidemia 09/17/2017  ? Family hx of colon cancer 09/11/2017  ? Celiac disease 09/11/2017  ? Influenza vaccination declined 01/01/2017  ? Vaginal condyloma 06/22/2013  ? Obesity 06/22/2013  ? Fatty liver disease, nonalcoholic 78/93/8101  ? ? ?Palliative Care Assessment & Plan  ? ?HPI: ?56 year old with prior history of coronary artery disease, STEMI in 2019, celiac disease, [name], GI bleed, chronic pain syndrome, liver cirrhosis who presented with generalized weakness, nausea, vomiting and abdominal pain, admitted with undifferentiated shock septic/hypovolemic. Patient came off of vasopressors, SBP was ruled out with negative peritoneal fluid studies. Urine cultures show yeast.  ?  ?Palliative care has been asked to get involved in the setting of noncompliance to further address goals of care. ? ?Assessment: ?Requested by attending physician to follow-up with patient and family. ?Went to patient's bedside she was initially alone.  She tells me she has been working with physical therapy but is exhausted.  I introduced myself as palliative care and she recalls conversations with palliative care last week. ?Patient's sister and other family member entered the room.  When I introduced myself as palliative care, sister inquired as to why we had come back prior to biopsy results.  Gently discussed that patient's condition continues to decline and that I am unsure if receiving biopsy results changes the big picture.  Patient's sister both expressed understanding but also share that they are unwilling to discuss  goals of care further until they receive biopsy results.  Patient becomes tearful and explains that she is scared to die.  Emotional support provided.  Confirmed that patient and sister have our contact information and they tell me they will contact us when they are ready to speak to [place] further.  Checked in with patient about her symptoms and pain-she tells me at this point they are well controlled by current interventions. ? ?Reviewed conversation with Dr. Sloan Leiter. ? ?Recommendations/Plan: ?Patient's sister requests no further follow-up until biopsy results are obtained ? ?Code Status: ?DNR ? ?Care plan was discussed with Dr. Sloan Leiter, physical therapist, patient, and patient's sister ? ?Thank you for allowing the Palliative Medicine Team to assist in the care of this patient. ? ? ?*Please note that this is a verbal dictation therefore any spelling or grammatical errors are due to the "[place] [employer]" system interpretation. ? ?Juel Burrow, DNP, AGNP-C ?Palliative Medicine Team ?Team Phone # [phone]  ?Pager [phone] ? ?

## 2021-05-04 NOTE — Progress Notes (Addendum)
Physical Therapy Treatment ?Patient Details ?Name: Sharon Lawrence ?MRN: [id number] ?DOB: [birth year] ?Today's Date: 05/04/2021 ? ? ?History of Present Illness Pt presented to [facility] for nausea and vomiting on 2/19. Pt with septic shock and UTI. PMH - STEMI, ADHD, anxiety, chronic pain, CAD, Celiac disease, ? ?  ?PT Comments  ? ? Pt received in bed, sleepy on arrival but easily awakened. Bed placed in chair position to perform LE exercises/AAROM. Max assist to pull trunk forward into sitting with bed in chair position. At end of session, pt positioned with pillows for comfort and pressure relief. Frequency decreased to 2x week due to progressive weakness and decreased activity tolerance. Per MD note, pt may transition home with hospice. ? ?  ?Recommendations for follow up therapy are one component of a multi-disciplinary discharge planning process, led by the attending physician.  Recommendations may be updated based on patient status, additional functional criteria and insurance authorization. ? ?Follow Up Recommendations ? Home health PT ?  ?  ?Assistance Recommended at Discharge Frequent or constant Supervision/Assistance  ?Patient can return home with the following Two people to help with walking and/or transfers;A lot of help with bathing/dressing/bathroom;Assistance with cooking/housework;Direct supervision/assist for medications management;Assist for transportation;Assistance with feeding ?  ?Equipment Recommendations ? Hospital bed;Other (comment) (hoyer lift)  ?  ?Recommendations for Other Services   ? ? ?  ?Precautions / Restrictions Precautions ?Precautions: Fall ?Precaution Comments: watch BP  ?  ? ?Mobility ? Bed Mobility ?Overal bed mobility: Needs Assistance ?Bed Mobility: Rolling ?Rolling: Max assist ?  ?  ?  ?  ?General bed mobility comments: max assist rolling R/L to adjust bed pads ?  ? ?Transfers ?Overall transfer level: Needs assistance ?  ?  ?  ?  ?  ?  ?  ?  ?General transfer comment: Bed placed in chair  position. Max assist to pull trunk forward to sit. Performed x 2 trials. ?  ? ?Ambulation/Gait ?  ?  ?  ?  ?  ?  ?  ?General Gait Details: Unable ? ? ?Stairs ?  ?  ?  ?  ?  ? ? ?Wheelchair Mobility ?  ? ?Modified Rankin (Stroke Patients Only) ?  ? ? ?  ?Balance   ?  ?  ?  ?  ?  ?  ?  ?  ?  ?  ?  ?  ?  ?  ?  ?  ?  ?  ?  ? ?  ?Cognition Arousal/Alertness: Awake/alert ?Behavior During Therapy: Flat affect (tearful) ?Overall Cognitive Status: Impaired/Different from baseline ?Area of Impairment: Following commands, Problem solving, Awareness ?  ?  ?  ?  ?  ?  ?  ?  ?  ?  ?  ?Following Commands: Follows one step commands with increased time, Follows one step commands consistently ?Safety/Judgement: Decreased awareness of deficits ?Awareness: Emergent ?Problem Solving: Slow processing, Requires verbal cues, Difficulty sequencing ?  ?  ?  ? ?  ?Exercises General Exercises - Lower Extremity ?Ankle Circles/Pumps: AROM, Both, 15 reps ?Short Arc Quad: AAROM, Right, Left, 10 reps ?Heel Slides: AAROM, Right, Left, 10 reps ?Hip ABduction/ADduction: AAROM, Right, Left, 10 reps ? ?  ?General Comments   ?  ?  ? ?Pertinent Vitals/Pain Pain Assessment ?Pain Assessment: Faces ?Faces Pain Scale: Hurts little more ?Pain Location: back ?Pain Descriptors / Indicators: Grimacing, Discomfort ?Pain Intervention(s): Limited activity within patient's tolerance, Monitored during session, Repositioned  ? ? ?Home Living   ?  ?  ?  ?  ?  ?  ?  ?  ?  ?   ?  ?  Prior Function    ?  ?  ?   ? ?PT Goals (current goals can now be found in the care plan section) Acute Rehab PT Goals ?Patient Stated Goal: home ?Progress towards PT goals: Not progressing toward goals - comment (progressive weakness) ? ?  ?Frequency ? ? ? Min 2X/week ? ? ? ?  ?PT Plan Frequency needs to be updated  ? ? ?Co-evaluation   ?  ?  ?  ?  ? ?  ?AM-PAC PT "6 Clicks" Mobility   ?Outcome Measure ? Help needed turning from your back to your side while in a flat bed without using  bedrails?: A Lot ?Help needed moving from lying on your back to sitting on the side of a flat bed without using bedrails?: Total ?Help needed moving to and from a bed to a chair (including a wheelchair)?: Total ?Help needed standing up from a chair using your arms (e.g., wheelchair or bedside chair)?: Total ?Help needed to walk in hospital room?: Total ?Help needed climbing 3-5 steps with a railing? : Total ?6 Click Score: 7 ? ?  ?End of Session   ?Activity Tolerance: Patient limited by fatigue ?Patient left: in bed;with call bell/phone within reach;with bed alarm set ?Nurse Communication: Mobility status;Need for lift equipment ?PT Visit Diagnosis: Unsteadiness on feet (R26.81);Other abnormalities of gait and mobility (R26.89);History of falling (Z91.81) ?  ? ? ?Time: 0354-6568 ?PT Time Calculation (min) (ACUTE ONLY): 19 min ? ?Charges:  $Therapeutic Exercise: 8-22 mins          ?          ? ?Lorrin Goodell, PT  ?Office # [phone] ?Pager [phone] ? ? ? ?Lorriane Shire ?05/04/2021, 10:18 AM ? ?

## 2021-05-05 LAB — COMPREHENSIVE METABOLIC PANEL
ALT: 40 U/L (ref 0–44)
AST: 115 U/L — ABNORMAL HIGH (ref 15–41)
Albumin: 2.2 g/dL — ABNORMAL LOW (ref 3.5–5.0)
Alkaline Phosphatase: 272 U/L — ABNORMAL HIGH (ref 38–126)
Anion gap: 7 (ref 5–15)
BUN: 17 mg/dL (ref 6–20)
CO2: 19 mmol/L — ABNORMAL LOW (ref 22–32)
Calcium: 7.8 mg/dL — ABNORMAL LOW (ref 8.9–10.3)
Chloride: 102 mmol/L (ref 98–111)
Creatinine, Ser: 0.85 mg/dL (ref 0.44–1.00)
GFR, Estimated: >60 mL/min (ref >60)
Glucose, Bld: 94 mg/dL (ref 70–99)
Potassium: 5 mmol/L (ref 3.5–5.1)
Sodium: 128 mmol/L — ABNORMAL LOW (ref 135–145)
Total Bilirubin: 5.1 mg/dL — ABNORMAL HIGH (ref 0.3–1.2)
Total Protein: 5 g/dL — ABNORMAL LOW (ref 6.5–8.1)

## 2021-05-05 MED ORDER — ONDANSETRON 4 MG PO TBDP
8.0000 mg | ORAL_TABLET | Freq: Three times a day (TID) | ORAL | Status: DC | PRN
  Administered 2021-05-05 – 2021-05-10 (×9): 8 mg via ORAL
  Filled 2021-05-05 (×9): qty 2

## 2021-05-05 NOTE — Progress Notes (Signed)
PROGRESS NOTE    Sharon Lawrence  VFI:[id number] DOB: 1965/11/06 DOA: 04/22/2021 PCP: Lindell Spar, MD    Brief Narrative:  56 year old with history of coronary artery disease, STEMI in 2019, celiac disease, [name] cirrhosis and GI bleeding, chronic pain syndrome presented with generalized weakness, nausea vomiting and abdominal pain.  She was admitted to intensive care unit with initial low blood pressures on vasopressors.  SBP was ruled out with negative peritoneal fluid studies.  Urine culture showed yeast.  Underwent MRI of the liver that showed cirrhosis, hematomas and moderate ascites with anasarca.  Patient had extensive weakness, failure to thrive, decreased oral intake and encephalopathy from hyperammonemia.  She had episodes of hypotension responding to IV fluids.  Remains in the hospital due to altered mental status, further medical work-up.   Assessment & Plan:   Shock: Likely hypovolemic shock.  Urine cultures with yeast, treated with Diflucan.  Peritoneal fluid negative.  Weaned off vasopressors.  Completed course of IV Rocephin.  Blood pressures adequate now. She is expected to have low normal blood pressure, on midodrine.  Patient does have leukocytosis with no evidence of bacterial infection.  Will monitor.  Decompensated cirrhosis with ascites and history of NASH/hepatic and Metabolic encephalopathy. Work-up negative for autoimmune hepatitis and viral hepatitis.  Progressive cirrhosis and portal hypertension. MRI of the liver consistent with hepatic lesions,  Patient underwent core liver biopsy and liver lesion biopsy 3/2, results are pending.  Paracentesis and cytopathology pending.  On lactulose and rifaximin. INR is stable.  Acute on chronic microcytic anemia, acute thrombocytopenia in the setting of liver cirrhosis and history of GI bleeding: Received 2 units of PRBC with improvement of hemoglobin.  Platelets are stable. Patient currently remains on vitamin K injection x3  days  Failure to thrive/moderate malnutrition/advanced liver disease: With multiple issues and advanced liver disease, patient has poor prognosis and outcome. She has been bedbound with poor mobility for long time now. Seen by gastroenterology, prognosis is poor.  Family wanting to have a diagnosis before deciding palliation. Patient and sister willing to go home with possible home health PT, if unable open with idea of hospice but wants to wait until biopsy comes back.  Cycle recruiters ulcer: Stage II.  Present on admission.  Pictures in the chart..  Dressing.  I discussed in detail with patient's sister at the bedside and recommended that we should have a conversation about home hospice.  Patient's sister tells me that whole family wants to wait for biopsy and decide about hospice. I discussed with her that even if she does not have a liver cancer, she qualifies to be treated as a hospice.   DVT prophylaxis: Place and maintain sequential compression device Start: 04/22/21 2229   Code Status: DNR Family Communication: Sister at the bedside. Disposition Plan: Status is: Inpatient Remains inpatient appropriate because:  Inadequate oral intake, unable to mobilize.   Consultants:  Gastroenterology Palliative  Procedures:  Paracentesis Liver biopsy 3/2  Antimicrobials:  Completed therapy.  Currently on rifaximin.   Subjective: Seen and examined.  She had some pain on mobility around the bed.  Denies any obvious nausea.  She used a dose of Zofran and Compazine last night and was able to eat her dinner.  Sister at the bedside helping with dressing changes. Blood pressures are adequate overnight.  Complaining of some right upper quadrant pain at the incision site.  Objective: Vitals:   05/04/21 2000 05/05/21 0000 05/05/21 0400 05/05/21 0700  BP: 121/76 110/79 105/67  112/68  Pulse: 91 96 98 99  Resp: 14 13 14 12   Temp: 98 F (36.7 C) 98.6 F (37 C) 98.6 F (37 C) 98.4 F  (36.9 C)  TempSrc: Oral Axillary Axillary Axillary  SpO2: 94% 94% 94% 95%  Weight:      Height:        Intake/Output Summary (Last 24 hours) at 05/05/2021 1148 Last data filed at 05/05/2021 0645 Gross per 24 hour  Intake --  Output 250 ml  Net -250 ml    Filed Weights   04/22/21 1410  Weight: 82.3 kg    Examination:  General exam: Chronically sick looking.  Frail and debilitated.  Icteric.  Ecchymotic.  Older than his stated age. Respiratory system: Clear to auscultation. Respiratory effort normal. Cardiovascular system: S1 & S2 heard, RRR.  2+ bilateral pedal edema mostly in the dorsum of the feet. Gastrointestinal system: Distended obese and pendulous.  Nontender.  Bowel sound present. Central nervous system: Alert and oriented. No focal neurological deficits.  Generalized weakness. Extremities: Symmetric 5 x 5 power.  Generalized weakness. Skin excoriation and sloughing of the buttock region.       Data Reviewed: I have personally reviewed following labs and imaging studies  CBC: Recent Labs  Lab 04/29/21 0242 05/01/21 0502 05/03/21 0345 05/04/21 0228  WBC 13.8* 18.2* 19.4* 20.0*  NEUTROABS 8.9* 11.4*  --  13.2*  HGB 9.2* 8.1* 7.5* 8.0*  HCT 25.6* 22.4* 21.9* 24.1*  MCV 79.3* 80.9 82.0 83.4  PLT 110* 120* 143* 342   Basic Metabolic Panel: Recent Labs  Lab 04/30/21 1605 05/01/21 0502 05/02/21 0348 05/04/21 0228 05/05/21 0034  NA 133* 133* 131* 127* 128*  K 3.5 3.6 3.2* 5.1 5.0  CL 102 103 102 101 102  CO2 21* 18* 20* 19* 19*  GLUCOSE 96 87 95 106* 94  BUN 16 14 15 17 17   CREATININE 0.75 0.92 0.98 0.89 0.85  CALCIUM 8.2* 8.3* 8.0* 7.7* 7.8*  MG  --   --   --  2.4  --   PHOS  --   --   --  2.5  --    GFR: Estimated Creatinine Clearance: 72.7 mL/min (by C-G formula based on SCr of 0.85 mg/dL). Liver Function Tests: Recent Labs  Lab 04/29/21 0242 05/01/21 0502 05/02/21 0348 05/04/21 0228 05/05/21 0034  AST 55* 101* 90* 115* 115*  ALT 17 32  31 42 40  ALKPHOS 120 169* 148* 262* 272*  BILITOT 6.6* 5.6* 4.8* 4.9* 5.1*  PROT 5.1* 5.2* 4.7* 4.9* 5.0*  ALBUMIN 2.5* 2.4* 2.0* 2.2* 2.2*   No results for input(s): LIPASE, AMYLASE in the last 168 hours. Recent Labs  Lab 04/30/21 1437 05/01/21 0502  AMMONIA 82* 56*   Coagulation Profile: Recent Labs  Lab 05/02/21 0348 05/03/21 0345  INR 1.8* 1.7*   Cardiac Enzymes: No results for input(s): CKTOTAL, CKMB, CKMBINDEX, TROPONINI in the last 168 hours. BNP (last 3 results) No results for input(s): PROBNP in the last 8760 hours. HbA1C: No results for input(s): HGBA1C in the last 72 hours. CBG: Recent Labs  Lab 04/30/21 2013  GLUCAP 137*   Lipid Profile: No results for input(s): CHOL, HDL, LDLCALC, TRIG, CHOLHDL, LDLDIRECT in the last 72 hours. Thyroid Function Tests: No results for input(s): TSH, T4TOTAL, FREET4, T3FREE, THYROIDAB in the last 72 hours. Anemia Panel: No results for input(s): VITAMINB12, FOLATE, FERRITIN, TIBC, IRON, RETICCTPCT in the last 72 hours. Sepsis Labs: Recent Labs  Lab 05/01/21 0502 05/02/21  0348 05/03/21 0345  PROCALCITON 0.59 0.81 0.90    No results found for this or any previous visit (from the past 240 hour(s)).        Radiology Studies: DG CHEST PORT 1 VIEW  Result Date: 05/03/2021 CLINICAL DATA:  Choking EXAM: PORTABLE CHEST 1 VIEW COMPARISON:  05/01/2021 FINDINGS: Single frontal view of the chest demonstrates an unremarkable cardiac silhouette. Linear density at the left lung base consistent with atelectasis. No airspace disease, effusion, or pneumothorax. No acute bony abnormality. IMPRESSION: 1. Stable left basilar atelectasis.  No acute process. Electronically Signed   By: Randa Ngo M.D.   On: 05/03/2021 22:16        Scheduled Meds:  feeding supplement  1 Container Oral TID BM   lactulose  30 g Oral TID   mouth rinse  15 mL Mouth Rinse BID   midodrine  15 mg Oral TID WC   multivitamin with minerals  1 tablet Oral  Daily   pantoprazole  40 mg Oral QHS   pregabalin  25 mg Oral BID   rifaximin  550 mg Oral BID   sertraline  25 mg Oral QPC breakfast   Continuous Infusions:  sodium chloride Stopped (04/23/21 0504)     LOS: 13 days    Time spent: 35 minutes    Barb Merino, MD [facility] Pager [phone]

## 2021-05-05 NOTE — Consult Note (Signed)
[place] Nurse wound follow up ?Wound type: Stage 2 pressure injury to buttocks is not progressing, however it is also noted that the patient's condition is not improving. Turning and repositioning is in place, topical care with xeroform is appropriate, however, I will change to a different topicla therapy in an attempt to elicit a healing response. ?Measurement:7cm x 2cm x 0.2cm ?Wound bed:Red, yellow ?Drainage (amount, consistency, odor) moderate serous ?Periwound: with evidence of some tissue regeneration at periphery. Mild maceration. ?Dressing procedure/placement/frequency: I have collaborated with the patient's bedside RN, O. Williams on the assessment of this patient and on the POC. Her assistance and expertise is appreciated.  ? ?A change from Xeroform to Silver hydrofiber (another dressing in the antimicrobial formulary, Aquacel Ag+ Advantage) will be made tomorrow. Turning and repositioning is in place, heels are floated.  ? ?[place] nursing team will not follow, but will remain available to this patient, the nursing and medical teams.  Please re-consult if needed. ?Thanks, ?Maudie Flakes, MSN, RN, [place], [place], CWON-AP, [place]  ?Pager# [phone]  ? ?  ?

## 2021-05-05 NOTE — Progress Notes (Signed)
? ?  Palliative Medicine Inpatient Follow Up Note ? ?HPI: ?56 year old with prior history of coronary artery disease, STEMI in 2019, celiac disease, Sharon Lawrence, GI bleed, chronic pain syndrome, liver cirrhosis who presented with generalized weakness, nausea, vomiting and abdominal pain, admitted with undifferentiated shock septic/hypovolemic. Patient came off of vasopressors, SBP was ruled out with negative peritoneal fluid studies. Urine cultures show yeast.  ?  ?Palliative care has been asked to get involved in the setting of noncompliance to further address goals of care. ? ?Today's Discussion (05/05/2021): ? ?*Please note that this is a verbal dictation therefore any spelling or grammatical errors are due to the "[place] [facility]" system interpretation. ? ?Chart reviewed inclusive of vital signs, progress notes, laboratory results, and diagnostic images.  ? ?I met with Sharon Lawrence at bedside this morning.  She shares that she has been feeling better throughout the week.  She denies any pain, nausea, shortness of breath this morning.  Reviewed the plan for her to get out of bed to the chair today. ? ?I spoke to patient's sister, Sharon Lawrence and father.  Sharon Lawrence expresses that Sharon Lawrence has had a better week thanks to the involvement of Dr. Laural Golden who is placed her on the appropriate medications to improve her present condition.  Reviewed that we are awaiting the biopsy results to further identify additional treatments moving forward. ? ?Patient's sister shares that they are in the process of confirming insurance which is a big relief. ? ?Questions and concerns were answered accordingly.  Palliative support was provided ? ?Objective Assessment: ?Vital Signs ?Vitals:  ? 05/05/21 0400 05/05/21 0700  ?BP: 105/67 112/68  ?Pulse: 98 99  ?Resp: 14 12  ?Temp: 98.6 ?F (37 ?C) 98.4 ?F (36.9 ?C)  ?SpO2: 94% 95%  ? ? ?Intake/Output Summary (Last 24 hours) at 05/05/2021 1427 ?Last data filed at 05/05/2021 1419 ?Gross per 24 hour  ?Intake --  ?Output 600 ml   ?Net -600 ml  ? ? ?Last Weight  Most recent update: 04/22/2021  2:11 PM  ? ? Weight  ?82.3 kg (181 lb 7 oz)  ?      ? ?  ? ?Gen: 32 Caucasian female in no acute distress ?HEENT: Dry mucous membranes ?CV: Irregular rate and regular rhythm  ?PULM: On room air breathing is even and nonlabored ?ABD: soft/nontender  ?EXT: Generalized pitting edema, profound anasarca ?Neuro: Alert and oriented x2-3 conversant this morning ? ?SUMMARY OF RECOMMENDATIONS   ?DNAR/DNI ?  ?Patient sister pleased as patients status "seems to improved" throughout the week ? ?Awaiting liver biopsy results for further [place] conversations - I believe Dr. Laural Golden will be an integral part of family making additional decisions considering their level of trust in him ?  ?Ongoing palliative care support ? ?MDM - Moderate  ?______________________________________________________________________________________ ?Tacey Ruiz ?[place] Team ?Team Cell Phone: [phone] ?Please utilize secure chat with additional questions, if there is no response within 30 minutes please call the above phone number ? ?Palliative Medicine Team providers are available by phone from 7am to 7pm daily and can be reached through the team cell phone.  ?Should this patient require assistance outside of these hours, please call the patient's attending physician. ? ? ? ? ?

## 2021-05-06 MED ORDER — PREGABALIN 25 MG PO CAPS
50.0000 mg | ORAL_CAPSULE | Freq: Two times a day (BID) | ORAL | Status: DC
  Administered 2021-05-06 – 2021-05-10 (×8): 50 mg via ORAL
  Filled 2021-05-06 (×9): qty 2

## 2021-05-06 MED ORDER — NYSTATIN 100000 UNIT/GM EX POWD
Freq: Three times a day (TID) | CUTANEOUS | Status: DC
  Filled 2021-05-06: qty 15

## 2021-05-06 MED ORDER — MIDODRINE HCL 5 MG PO TABS
10.0000 mg | ORAL_TABLET | Freq: Three times a day (TID) | ORAL | Status: DC
  Administered 2021-05-06 – 2021-05-10 (×13): 10 mg via ORAL
  Filled 2021-05-06 (×14): qty 2

## 2021-05-06 NOTE — Progress Notes (Addendum)
? ?Palliative Medicine Inpatient Follow Up Note ? ?HPI: ?56 year old with prior history of coronary artery disease, STEMI in 2019, celiac disease, Sharon Lawrence, GI bleed, chronic pain syndrome, liver cirrhosis who presented with generalized weakness, nausea, vomiting and abdominal pain, admitted with undifferentiated shock septic/hypovolemic. Patient came off of vasopressors, SBP was ruled out with negative peritoneal fluid studies. Urine cultures show yeast.  ?  ?Palliative care has been asked to get involved in the setting of noncompliance to further address goals of care. ? ?Today's Discussion (05/06/2021): ? ?*Please note that this is a verbal dictation therefore any spelling or grammatical errors are due to the "[place] [employer]" system interpretation. ? ?Chart reviewed inclusive of vital signs, progress notes, laboratory results, and diagnostic images.  ? ?I met with patients sister, Sharon Lawrence this morning. She shares with me various grievances since hospitalization. She expresses a variety of concerns. I shared that these may best be supported by the patient experience department and I provided her with this phone number. ? ?I met with Sharon Lawrence at bedside she was being cleaned up by her bedside RN and nursing technician. She has been having frequent loose BM's in the setting of her lactulose. She has some pressure injuries on her bottom. Reviewed with nursing the recs of the [facility] team. ? ?As of my conversation with [facility] this morning, the plan is to await the biopsy results. She realizes that overall, Sharon Lawrence is not doing terribly well. She shares when their mother died she made the decision to enroll in hospice and passed quickly. Sharon Lawrence would like for Sharon Lawrence to have a similar experience. She does not want the matter to be forced on Sharon Lawrence.  ? ?Attempted with nursing staff to get patient out of bed though she is far more weak than last week.  ? ?Questions and concerns were answered accordingly.   ? ?Palliative support was  provided. ? ?Objective Assessment: ?Vital Signs ?Vitals:  ? 05/05/21 2000 05/06/21 0829  ?BP: 125/82 97/61  ?Pulse: 98 99  ?Resp: 13 15  ?Temp: 99.2 ?F (37.3 ?C)   ?SpO2: 94% 95%  ? ? ?Intake/Output Summary (Last 24 hours) at 05/06/2021 0941 ?Last data filed at 05/06/2021 0600 ?Gross per 24 hour  ?Intake 100 ml  ?Output 650 ml  ?Net -550 ml  ? ? ?Last Weight  Most recent update: 04/22/2021  2:11 PM  ? ? Weight  ?82.3 kg (181 lb 7 oz)  ?      ? ?  ? ?Gen: 25 Caucasian female in no acute distress ?HEENT: Dry mucous membranes ?CV: Irregular rate and regular rhythm  ?PULM: On room air breathing is even and nonlabored ?ABD: soft/nontender  ?EXT: Generalized pitting edema, profound anasarca ?Neuro: Alert and oriented x2-3 conversant this morning ? ?SUMMARY OF RECOMMENDATIONS   ?DNAR/DNI ?  ?Patient sister pleased as patients status "seems to improved" throughout the week ? ?Awaiting liver biopsy results for further [place] conversations - I believe Dr. Laural Golden will be an integral part of family making additional decisions considering their level of trust in him ?  ?Ongoing palliative care support ? ?MDM - High ?______________________________________________________________________________________ ?Tacey Ruiz ?[place] Team ?Team Cell Phone: [phone] ?Please utilize secure chat with additional questions, if there is no response within 30 minutes please call the above phone number ? ?Palliative Medicine Team providers are available by phone from 7am to 7pm daily and can be reached through the team cell phone.  ?Should this patient require assistance outside of these hours, please  call the patient's attending physician. ? ? ? ? ?

## 2021-05-06 NOTE — Progress Notes (Signed)
?PROGRESS NOTE ? ? ? ?Sharon Lawrence  YIR:[id number] DOB: 02-05-1966 DOA: 04/22/2021 ?PCP: Lindell Spar, MD  ? ? ?Brief Narrative:  ?56 year old with history of coronary artery disease, STEMI in 2019, celiac disease, [name] cirrhosis and GI bleeding, chronic pain syndrome presented with generalized weakness, nausea vomiting and abdominal pain.  She was admitted to intensive care unit with initial low blood pressures on vasopressors.  SBP was ruled out with negative peritoneal fluid studies.  Urine culture showed yeast.  Underwent MRI of the liver that showed cirrhosis, hematomas and moderate ascites with anasarca.  Patient had extensive weakness, failure to thrive, decreased oral intake and encephalopathy from hyperammonemia.  She had episodes of hypotension responding to IV fluids.  Remains in the hospital due to altered mental status, further medical work-up. ? ? ?Assessment & Plan: ?  ?Shock: Likely hypovolemic shock.  Urine cultures with yeast, treated with Diflucan.  Peritoneal fluid negative.  Weaned off vasopressors.  Completed course of IV Rocephin.  Blood pressures adequate now. She is expected to have low normal blood pressure, on midodrine.  Patient does have leukocytosis with no evidence of bacterial infection.  Will monitor. ? ?Decompensated cirrhosis with ascites and history of NASH/hepatic and Metabolic encephalopathy. ?Work-up negative for autoimmune hepatitis and viral hepatitis.  Progressive cirrhosis and portal hypertension. ?MRI of the liver consistent with hepatic lesions,  ?Patient underwent core liver biopsy and liver lesion biopsy 3/2, results are pending.  Paracentesis and cytopathology pending.  On lactulose and rifaximin. ?INR is stable. ? ?Acute on chronic microcytic anemia, acute thrombocytopenia in the setting of liver cirrhosis and history of GI bleeding: ?Received 2 units of PRBC with improvement of hemoglobin.  Platelets are stable. ?Patient currently remains on vitamin K injection x3  days ? ?Failure to thrive/moderate malnutrition/advanced liver disease: ?With multiple issues and advanced liver disease, patient has poor prognosis and outcome. ?She has been bedbound with poor mobility for long time now. ?Seen by gastroenterology, prognosis is poor.  Family wanting to have a diagnosis before deciding palliation. ?Will benefit with home hospice.  They want to wait for biopsy results before deciding. ?Patient responded well to low-dose oxycodone to improve her pain and mobility in the bed, she also tolerated Lyrica. ?Increase Lyrica 50 mg twice daily today.  Continue oxycodone 2.5 mg every 6 hours as needed.  Blood pressures are adequate, will decrease dose of midodrine. ? ?Sacral decubitus ulcer: Stage II.  Present on admission.  Pictures in the chart..  Dressing. ? ? ? ?DVT prophylaxis: Place and maintain sequential compression device Start: 04/22/21 2229 ? ? ?Code Status: DNR ?Family Communication: None. ?Disposition Plan: Status is: Inpatient ?Remains inpatient appropriate because:  ?Inadequate oral intake, unable to mobilize. ? ? ?Consultants:  ?Gastroenterology ?Palliative ? ?Procedures:  ?Paracentesis ?Liver biopsy 3/2 ? ?Antimicrobials:  ?Completed therapy.  Currently on rifaximin. ? ? ?Subjective ?Patient seen and examined.  Today no family at the bedside.  No overnight events.  Patient is without any nausea vomiting or discomfort today.  Buttock hurts due to frequent repositioning and loose stool. ? ?Objective: ?Vitals:  ? 05/05/21 0700 05/05/21 1552 05/05/21 2000 05/06/21 0829  ?BP: 112/68 116/64 125/82 97/61  ?Pulse: 99 99 98 99  ?Resp: 12 13 13 15   ?Temp: 98.4 ?F (36.9 ?C)  99.2 ?F (37.3 ?C)   ?TempSrc: Axillary  Axillary   ?SpO2: 95% 100% 94% 95%  ?Weight:      ?Height:      ? ? ?Intake/Output Summary (Last 24  hours) at 05/06/2021 1104 ?Last data filed at 05/06/2021 0600 ?Gross per 24 hour  ?Intake 100 ml  ?Output 650 ml  ?Net -550 ml  ? ? ?Filed Weights  ? 04/22/21 1410  ?Weight: 82.3 kg   ? ? ?Examination: ? ?General exam: Chronically sick looking.  Frail and debilitated.  Icteric.  Ecchymotic.  Older than her stated age. ?Respiratory system: Clear to auscultation. Respiratory effort normal. ?Cardiovascular system: S1 & S2 heard, RRR.  2+ bilateral pedal edema mostly in the dorsum of the feet. ?Gastrointestinal system: Distended obese and pendulous.  Nontender.  Bowel sound present. ?Central nervous system: Alert and oriented. No focal neurological deficits.  Generalized weakness. ?Extremities: Symmetric 5 x 5 power.  Generalized weakness. ?Skin excoriation and sloughing of the buttock region. ? ? ?Data Reviewed: I have personally reviewed following labs and imaging studies ? ?CBC: ?Recent Labs  ?Lab 05/01/21 ?0502 05/03/21 ?0345 05/04/21 ?0228  ?WBC 18.2* 19.4* 20.0*  ?NEUTROABS 11.4*  --  13.2*  ?HGB 8.1* 7.5* 8.0*  ?HCT 22.4* 21.9* 24.1*  ?MCV 80.9 82.0 83.4  ?PLT 120* 143* 190  ? ?Basic Metabolic Panel: ?Recent Labs  ?Lab 04/30/21 ?8916 05/01/21 ?0502 05/02/21 ?9450 05/04/21 ?0228 05/05/21 ?0034  ?NA 133* 133* 131* 127* 128*  ?K 3.5 3.6 3.2* 5.1 5.0  ?CL 102 103 102 101 102  ?CO2 21* 18* 20* 19* 19*  ?GLUCOSE 96 87 95 106* 94  ?BUN 16 14 15 17 17   ?CREATININE 0.75 0.92 0.98 0.89 0.85  ?CALCIUM 8.2* 8.3* 8.0* 7.7* 7.8*  ?MG  --   --   --  2.4  --   ?PHOS  --   --   --  2.5  --   ? ?GFR: ?Estimated Creatinine Clearance: 72.7 mL/min (by C-G formula based on SCr of 0.85 mg/dL). ?Liver Function Tests: ?Recent Labs  ?Lab 05/01/21 ?0502 05/02/21 ?3888 05/04/21 ?0228 05/05/21 ?0034  ?AST 101* 90* 115* 115*  ?ALT 32 31 42 40  ?ALKPHOS 169* 148* 262* 272*  ?BILITOT 5.6* 4.8* 4.9* 5.1*  ?PROT 5.2* 4.7* 4.9* 5.0*  ?ALBUMIN 2.4* 2.0* 2.2* 2.2*  ? ?No results for input(s): LIPASE, AMYLASE in the last 168 hours. ?Recent Labs  ?Lab 04/30/21 ?1437 05/01/21 ?0502  ?AMMONIA 82* 56*  ? ?Coagulation Profile: ?Recent Labs  ?Lab 05/02/21 ?2800 05/03/21 ?0345  ?INR 1.8* 1.7*  ? ?Cardiac Enzymes: ?No results for  input(s): CKTOTAL, CKMB, CKMBINDEX, TROPONINI in the last 168 hours. ?BNP (last 3 results) ?No results for input(s): PROBNP in the last 8760 hours. ?HbA1C: ?No results for input(s): HGBA1C in the last 72 hours. ?CBG: ?Recent Labs  ?Lab 04/30/21 ?2013  ?GLUCAP 137*  ? ?Lipid Profile: ?No results for input(s): CHOL, HDL, LDLCALC, TRIG, CHOLHDL, LDLDIRECT in the last 72 hours. ?Thyroid Function Tests: ?No results for input(s): TSH, T4TOTAL, FREET4, T3FREE, THYROIDAB in the last 72 hours. ?Anemia Panel: ?No results for input(s): VITAMINB12, FOLATE, FERRITIN, TIBC, IRON, RETICCTPCT in the last 72 hours. ?Sepsis Labs: ?Recent Labs  ?Lab 05/01/21 ?0502 05/02/21 ?3491 05/03/21 ?0345  ?PROCALCITON 0.59 0.81 0.90  ? ? ?No results found for this or any previous visit (from the past 240 hour(s)). ?  ? ? ? ? ? ?Radiology Studies: ?No results found. ? ? ? ? ? ?Scheduled Meds: ? feeding supplement  1 Container Oral TID BM  ? lactulose  30 g Oral TID  ? mouth rinse  15 mL Mouth Rinse BID  ? midodrine  10 mg Oral TID WC  ?  multivitamin with minerals  1 tablet Oral Daily  ? nystatin   Topical TID  ? pantoprazole  40 mg Oral QHS  ? pregabalin  50 mg Oral BID  ? rifaximin  550 mg Oral BID  ? sertraline  25 mg Oral QPC breakfast  ? ?Continuous Infusions: ? sodium chloride Stopped (04/23/21 0504)  ? ? ? LOS: 14 days  ? ? ?Time spent: 35 minutes ? ? ? ?Barb Merino, MD ?[facility] ?Pager [phone]  ?

## 2021-05-07 MED ORDER — OXYCODONE HCL 5 MG PO TABS
5.0000 mg | ORAL_TABLET | Freq: Four times a day (QID) | ORAL | Status: DC | PRN
  Administered 2021-05-07 – 2021-05-10 (×5): 5 mg via ORAL
  Filled 2021-05-07 (×5): qty 1

## 2021-05-07 NOTE — Plan of Care (Signed)
?  Problem: Nutrition: ?Goal: Adequate nutrition will be maintained ?Outcome: Progressing ?  ?Problem: Elimination: ?Goal: Will not experience complications related to bowel motility ?Outcome: Progressing ?Goal: Will not experience complications related to urinary retention ?Outcome: Progressing ?  ?Problem: Pain Managment: ?Goal: General experience of comfort will improve ?Outcome: Progressing ?  ?  ?

## 2021-05-07 NOTE — Progress Notes (Signed)
? ?  Palliative Medicine Inpatient Follow Up Note ? ?HPI: ?56 year old with prior history of coronary artery disease, STEMI in 2019, celiac disease, Sharon Lawrence, GI bleed, chronic pain syndrome, liver cirrhosis who presented with generalized weakness, nausea, vomiting and abdominal pain, admitted with undifferentiated shock septic/hypovolemic. Patient came off of vasopressors, SBP was ruled out with negative peritoneal fluid studies. Urine cultures show yeast.  ?  ?Palliative care has been asked to get involved in the setting of noncompliance to further address goals of care. ? ?Today's Discussion (05/07/2021): ? ?*Please note that this is a verbal dictation therefore any spelling or grammatical errors are due to the "[employer]" system interpretation. ? ?Chart reviewed inclusive of vital signs, progress notes, laboratory results, and diagnostic images.  ? ?I met with Sharon Lawrence this morning. She was comfortable and did not appear to be in any distress. She does endorse that she is incrementally in pain with movement. She states that this is fairly consistent and there are plans for an increase in her oxycodone - this was changed from 2.82m to 593m  ? ?Patient father, Sharon Glaseras present at bedside. He shares that he has no questions at this time.  ? ?Reviewed that biopsy results remain pending ? ?Questions and concerns were answered accordingly ? ?Palliative support was provided ? ?Objective Assessment: ?Vital Signs ?Vitals:  ? 05/07/21 [id number] 1128  ?BP: 100/71 106/65  ?Pulse: 98   ?Resp: 12   ?Temp: 97.9 ?F (36.6 ?C) 98.2 ?F (36.8 ?C)  ?SpO2: 96%   ? ?No intake or output data in the 24 hours ending 05/07/21 1131 ? ?Last Weight  Most recent update: 04/22/2021  2:11 PM  ? ? Weight  ?82.3 kg (181 lb 7 oz)  ?      ? ?  ? ?Gen: Mi77aucasian female in no acute distress ?HEENT: Dry mucous membranes ?CV: Irregular rate and regular rhythm  ?PULM: On room air breathing is even and nonlabored ?ABD: soft/nontender  ?EXT:  Generalized pitting edema, profound anasarca ?Neuro: Alert and oriented x2-3 conversant this morning ? ?SUMMARY OF RECOMMENDATIONS   ?DNAR/DNI ?  ?Appreciate primary team reaching out to Dr. ReLaural Goldeno speak to patients sister and father ? ?Very difficult situation whereby Sharon Lawrence's underlying disease burden is adversely affecting her health and ability to reach recovery ? ?Options presently are limited for discharge in that Sharon Lawrence could possibly go to SNF versus home with hospice. She is total care at this time which is a drastic change from how she came into the hospital ? ?Increased oxycodone to better address pain ?  ?Ongoing palliative care support ? ?MDM - Moderate  ?______________________________________________________________________________________ ?[facility] [place] ?Team Cell Phone: [phone] utilize secure chat with additional questions, if there is no response within 30 minutes please call the above phone number ? ?Palliative Medicine Team providers are available by phone from 7am to 7pm daily and can be reached through the team cell phone.  ?Should this patient require assistance outside of these hours, please call the patient's attending physician. ? ? ? ? ?

## 2021-05-07 NOTE — Progress Notes (Signed)
?PROGRESS NOTE ? ? ? ?Sharon Lawrence  FFM:[id number] DOB: August 13, 1965 DOA: 04/22/2021 ?PCP: Lindell Spar, MD  ? ? ?Brief Narrative:  ?56 year old with history of coronary artery disease, STEMI in 2019, celiac disease, [name] cirrhosis and GI bleeding, chronic pain syndrome presented with generalized weakness, nausea vomiting and abdominal pain.  She was admitted to intensive care unit with initial low blood pressures on vasopressors.  SBP was ruled out with negative peritoneal fluid studies.  Urine culture showed yeast.  Underwent MRI of the liver that showed cirrhosis, hematomas and moderate ascites with anasarca.  Patient had extensive weakness, failure to thrive, decreased oral intake and encephalopathy from hyperammonemia.  She had episodes of hypotension responding to IV fluids.  Remains in the hospital, very debilitated.  Liver biopsy is pending. ? ?Assessment & Plan: ?  ?Shock: Hypovolemic shock.  Improved.  Decrease midodrine 10 mg 3 times daily. ? ?Decompensated cirrhosis with ascites and history of NASH/hepatic and Metabolic encephalopathy. ?Work-up negative for autoimmune hepatitis and viral hepatitis.  Progressive cirrhosis and portal hypertension. ?MRI of the liver consistent with hepatic lesions,  ?Patient underwent core liver biopsy and liver lesion biopsy 3/2, results are pending.  Paracentesis and cytopathology pending.  On lactulose and rifaximin. ?INR is stable. ?Recheck LFTs and electrolytes tomorrow.  Treated with vitamin K. ? ?Acute on chronic microcytic anemia, acute thrombocytopenia in the setting of liver cirrhosis and history of GI bleeding: ?Received 2 units of PRBC with improvement of hemoglobin.  Platelets are stable.  Recheck CBC tomorrow. ? ?Failure to thrive/moderate malnutrition/advanced liver disease: ?With multiple issues and advanced liver disease, patient has poor prognosis and outcome. ?She has been bedbound with poor mobility for long time now. ?Seen by gastroenterology, prognosis is  poor.  Family wanting to have a diagnosis before deciding palliation. ?Will benefit with home hospice.  They want to wait for biopsy results before deciding. ?Patient responded well to low-dose oxycodone to improve her pain and mobility in the bed, she also tolerated Lyrica.  Lyrica increased to 50 mg twice daily.  Increase oxycodone to 5 mg every 6 hours for pain control.  Blood pressures are adequate. ? ?Sacral decubitus ulcer: Stage II.  Present on admission.  Pictures in the chart..  Dressing. ? ?We will communicate with Dr. Laural Golden who is patient's primary gastroenterologist to coordinate care with patient and family. ? ? ? ?DVT prophylaxis: Place and maintain sequential compression device Start: 04/22/21 2229 ? ? ?Code Status: DNR ?Family Communication: Son at the bedside. ?Disposition Plan: Status is: Inpatient ?Remains inpatient appropriate because:  ?Inadequate oral intake, unable to mobilize. ? ? ?Consultants:  ?Gastroenterology ?Palliative ? ?Procedures:  ?Paracentesis ?Liver biopsy 3/2 ? ?Antimicrobials:  ?Completed therapy.  Currently on rifaximin. ? ? ?Subjective ? ?Patient seen and examined.  Blood pressure stable.  She rested some at night with oxycodone.  She was wondering whether she can have better pain control.  Son was at the bedside.  Trying to eat however appetite is poor. ? ?Objective: ?Vitals:  ? 05/07/21 0800 05/07/21 0825 05/07/21 0927 05/07/21 1128  ?BP: (!) 86/62 98/62 100/71 106/65  ?Pulse: (!) 101 (!) 102 98   ?Resp: 12 16 12    ?Temp:  98.8 ?F (37.1 ?C) 97.9 ?F (36.6 ?C) 98.2 ?F (36.8 ?C)  ?TempSrc:  Oral Oral Oral  ?SpO2: 94% 94% 96%   ?Weight:      ?Height:      ? ? ?Intake/Output Summary (Last 24 hours) at 05/07/2021 1240 ?Last data filed  at 05/07/2021 1100 ?Gross per 24 hour  ?Intake --  ?Output 230 ml  ?Net -230 ml  ? ? ?Filed Weights  ? 04/22/21 1410  ?Weight: 82.3 kg  ? ? ?Examination: ? ?General exam: Chronically sick looking.  Frail and debilitated.  Icteric.  Ecchymotic.  Older  than her stated age. ?Respiratory system: Clear to auscultation. Respiratory effort normal. ?Cardiovascular system: S1 & S2 heard, RRR.  2+ bilateral pedal edema mostly in the dorsum of the feet. ?Gastrointestinal system: Distended obese and pendulous.  Nontender.  Bowel sound present. ?Central nervous system: Alert and oriented. No focal neurological deficits.  Generalized weakness. ?Extremities: Symmetric 5 x 5 power.  Generalized weakness. ?Skin excoriation and sloughing of the buttock region. ? ? ?Data Reviewed: I have personally reviewed following labs and imaging studies ? ?CBC: ?Recent Labs  ?Lab 05/01/21 ?0502 05/03/21 ?0345 05/04/21 ?0228  ?WBC 18.2* 19.4* 20.0*  ?NEUTROABS 11.4*  --  13.2*  ?HGB 8.1* 7.5* 8.0*  ?HCT 22.4* 21.9* 24.1*  ?MCV 80.9 82.0 83.4  ?PLT 120* 143* 190  ? ?Basic Metabolic Panel: ?Recent Labs  ?Lab 04/30/21 ?9179 05/01/21 ?0502 05/02/21 ?1505 05/04/21 ?0228 05/05/21 ?0034  ?NA 133* 133* 131* 127* 128*  ?K 3.5 3.6 3.2* 5.1 5.0  ?CL 102 103 102 101 102  ?CO2 21* 18* 20* 19* 19*  ?GLUCOSE 96 87 95 106* 94  ?BUN 16 14 15 17 17   ?CREATININE 0.75 0.92 0.98 0.89 0.85  ?CALCIUM 8.2* 8.3* 8.0* 7.7* 7.8*  ?MG  --   --   --  2.4  --   ?PHOS  --   --   --  2.5  --   ? ?GFR: ?Estimated Creatinine Clearance: 72.7 mL/min (by C-G formula based on SCr of 0.85 mg/dL). ?Liver Function Tests: ?Recent Labs  ?Lab 05/01/21 ?0502 05/02/21 ?6979 05/04/21 ?0228 05/05/21 ?0034  ?AST 101* 90* 115* 115*  ?ALT 32 31 42 40  ?ALKPHOS 169* 148* 262* 272*  ?BILITOT 5.6* 4.8* 4.9* 5.1*  ?PROT 5.2* 4.7* 4.9* 5.0*  ?ALBUMIN 2.4* 2.0* 2.2* 2.2*  ? ?No results for input(s): LIPASE, AMYLASE in the last 168 hours. ?Recent Labs  ?Lab 04/30/21 ?1437 05/01/21 ?0502  ?AMMONIA 82* 56*  ? ?Coagulation Profile: ?Recent Labs  ?Lab 05/02/21 ?4801 05/03/21 ?0345  ?INR 1.8* 1.7*  ? ?Cardiac Enzymes: ?No results for input(s): CKTOTAL, CKMB, CKMBINDEX, TROPONINI in the last 168 hours. ?BNP (last 3 results) ?No results for input(s): PROBNP  in the last 8760 hours. ?HbA1C: ?No results for input(s): HGBA1C in the last 72 hours. ?CBG: ?Recent Labs  ?Lab 04/30/21 ?2013  ?GLUCAP 137*  ? ?Lipid Profile: ?No results for input(s): CHOL, HDL, LDLCALC, TRIG, CHOLHDL, LDLDIRECT in the last 72 hours. ?Thyroid Function Tests: ?No results for input(s): TSH, T4TOTAL, FREET4, T3FREE, THYROIDAB in the last 72 hours. ?Anemia Panel: ?No results for input(s): VITAMINB12, FOLATE, FERRITIN, TIBC, IRON, RETICCTPCT in the last 72 hours. ?Sepsis Labs: ?Recent Labs  ?Lab 05/01/21 ?0502 05/02/21 ?6553 05/03/21 ?0345  ?PROCALCITON 0.59 0.81 0.90  ? ? ?No results found for this or any previous visit (from the past 240 hour(s)). ?  ? ? ? ? ? ?Radiology Studies: ?No results found. ? ? ? ? ? ?Scheduled Meds: ? feeding supplement  1 Container Oral TID BM  ? lactulose  30 g Oral TID  ? mouth rinse  15 mL Mouth Rinse BID  ? midodrine  10 mg Oral TID WC  ? multivitamin with minerals  1 tablet Oral  Daily  ? nystatin   Topical TID  ? pantoprazole  40 mg Oral QHS  ? pregabalin  50 mg Oral BID  ? rifaximin  550 mg Oral BID  ? sertraline  25 mg Oral QPC breakfast  ? ?Continuous Infusions: ? sodium chloride Stopped (04/23/21 0504)  ? ? ? LOS: 15 days  ? ? ?Time spent: 35 minutes ? ? ? ?Barb Merino, MD ?[facility] ?Pager [phone]  ?

## 2021-05-07 NOTE — Progress Notes (Signed)
MD Barb Merino and charge RN Athena Masse made aware of yellow MEWS score on shift.  ?

## 2021-05-07 NOTE — Consult Note (Signed)
[facility] New Face-to-Face Psychiatric Evaluation   Service Date: May 07, 2021 LOS: 15  Assessment  Sharon Lawrence is a 56 y.o. female admitted medically for 04/22/2021  2:13 PM for Sepsis [facility]). She carries the psychiatric diagnoses of MDD, GAD, panic disorder, alcohol abuse in remission, SA (choking herself as teen) without hospitalization and has a past medical history of CAD, Celiac disease, cirrhosis 2/2 previously presumed NASH, CAD, STEMI 2019.  Psychiatry was consulted for depression evaluation by Barb Merino, MD.  Her initial presentation of guilt, hopelessness, hypersomnia, decreased appetite, decreased energy, apathy, anhedonia since 7-8 months ago is most consistent with MDD, although likely contributed to by worsening of liver failure.  She also reported symptoms of excessive worrying and feeling overwhelmed because of sleep disturbances, muscle tension, fatigue, irritability, consistent with GAD.  She also reported being fearful of crowds and having panic attacks, will inquire further when patient is more alert to assess for possible agoraphobia and panic disorder (would not change management). She had mild delirium on exam 2/27 with deficits largely in maintaining attention. She was started on sertraline 25 mg qD by psychiatry after initial visit; is on olanzapine per GI (offlabel for nausea). She has consistently expressed that maintaining alertness is most important to her.   We recommend continuing low-dose Zoloft for MDD and GAD and titrating up as tolerated, while monitoring for hyponatremia and GI symptoms.   On re-evaluation 3/6, pt with reported improvement in anxiety, worry, rumination.  More sedated than previously and difficult to engage.  Please see plan below for detailed recommendations.  Diagnoses:  Principal Problem:   Acute sepsis ([place]) Active Problems:   Benign essential hypertension   Hypokalemia   Thrombocytopenia (HCC)   Pressure  injury of skin   Moderate episode of recurrent major depressive disorder (HCC)   Malnutrition of moderate degree   Liver failure (HCC)   Ascites   Hepatic cirrhosis (HCC)   Encephalopathy, hepatic   Mass of multiple sites of liver  Plan  ## Safety and Observation Level:  - Based on my clinical evaluation, I estimate the patient to be at low risk of self harm in the current setting - At this time, we recommend a routine level of observation. This decision is based on my review of the chart including patient's history and current presentation, interview of the patient, mental status examination, and consideration of suicide risk including evaluating suicidal ideation, plan, intent, suicidal or self-harm behaviors, risk factors, and protective factors. This judgment is based on our ability to directly address suicide risk, implement suicide prevention strategies and develop a safety plan while the patient is in the clinical setting. Please contact our team if there is a concern that risk level has changed.   ## Medications:  -- continue Zoloft 25 mg daily, with plans to titrate up as tolerated in 2-3 weeks -- decrease olanzapine from 5 BID to 5 QHS and 2.5 TID PRN.   ## Medical Decision Making Capacity:  -- Not formally assessed. Mild delirium on exam (inattentive) but benefited from questions being explained multiple times in different ways; would utilize teachbacks for major decisions.   ## Further Work-up:  -- Per primary team  -- Most recent EKG on 2/21 had QTc 505, HR 97 (qtc 467 using fredericia correction, no need to stop antipsychotic)  -- cannot review EKG 2/27  ## Disposition:  -- Per primary team pending clinical improvement  Thank you for this consult request. Recommendations have been  communicated to the primary team.  We will follow at this time; may see later thi sweek if still here  Mikes history  Relevant Aspects of Hospital Course:  Admitted  on 04/22/2021 for Sepsis [facility]).  Patient Report:  Pt reports that she is overall doing well with increase in anxiety sympotms at night. Has been doing well having family come over to stay with her. Seems tired - having trouble balancing pain, nausea, and alertness. Was able to move her arms much higher than previously. Hopeful she will get better with physical therapy, seems to have poor insight into overall prognosis. No SI/HI/AH/VH. Emotions validated and supportive therapy provided as indicated.   Did not perform attention testing (pt fell asleep 2-3 times during conversation)   ROS:  Confusion, sedation  Please see initial consult note for psychiatric, social  history.   Family History:  The patient's family history includes Anxiety disorder in her sister; Bipolar disorder in her son; CAD in her maternal grandfather and paternal grandmother; Cancer in her mother and sister; Cirrhosis in her paternal grandfather; Depression in her sister; Diabetes in her paternal grandmother; Drug abuse in her son; Hypertension in her father and mother; Hypothyroidism in her father and paternal grandmother.  Medical History: Past Medical History:  Diagnosis Date   Acute ST elevation myocardial infarction (STEMI) of inferior wall ([place]) 09/15/2017   ADHD (attention deficit hyperactivity disorder)    Anxiety    B12 deficiency 02/07/2011   Back pain    CAD (coronary artery disease)    a. s/p recent STEMI on 09/15/2017 with DES to RCA   Celiac disease    Folic acid deficiency 24/23/5361   GI bleed 11/06/2017   Headache    Hepatic cirrhosis ([place])    History of ST elevation myocardial infarction (STEMI) 11/06/2017   Iron deficiency anemia 02/07/2011   Myocardial infarction ([place]) 09/15/2017   Syncope and collapse 11/06/2017    Surgical History: Past Surgical History:  Procedure Laterality Date   BIOPSY  07/12/2020   Procedure: BIOPSY;  Surgeon: Rogene Houston, MD;  [facility];   Service: Endoscopy;;  duodenum(2nd part);antral   CHOLECYSTECTOMY     COLONOSCOPY WITH PROPOFOL N/A 09/26/2017   rehman examined portion of ileum normal, - Five small, non-bleeding polyps in the rectum, in the sigmoid colon and at the hepatic   CORONARY STENT INTERVENTION Right 09/15/2017   Procedure: CORONARY STENT INTERVENTION;  Surgeon: Jettie Booze, MD;  Location: [place] [facility] LAB;  Service: Cardiovascular;  Laterality: Right;  RCA   ESOPHAGOGASTRODUODENOSCOPY (EGD) WITH PROPOFOL N/A 07/12/2020   rehman: normal hypopharynx, Normal esophagus. z line regular 35 cm from incisors, congested and erythematous mucosa in antrum, mucosal changes in duodenum, very pronounced changes to suggest active celiac disease   KNEE CARTILAGE SURGERY Right    LEFT HEART CATH AND CORONARY ANGIOGRAPHY N/A 09/15/2017   Procedure: LEFT HEART CATH AND CORONARY ANGIOGRAPHY;  Surgeon: Jettie Booze, MD;  Location: [place] [facility] LAB;  Service: Cardiovascular;  Laterality: N/A;   TONSILLECTOMY      Medications:   Current Facility-Administered Medications:    0.9 %  sodium chloride infusion, , Intravenous, PRN, Margaretha Seeds, MD, Paused at 04/23/21 4431   acetaminophen (TYLENOL) tablet 650 mg, 650 mg, Oral, Q6H PRN, Jacky Kindle, MD, 650 mg at 05/01/21 2004   feeding supplement (BOOST / RESOURCE BREEZE) liquid 1 Container, 1 Container, Oral, TID BM, Hosie Poisson, MD, 1 Container at  05/06/21 2110   lactulose (CHRONULAC) 10 GM/15ML solution 30 g, 30 g, Oral, TID, Hosie Poisson, MD, 30 g at 05/07/21 1637   MEDLINE mouth rinse, 15 mL, Mouth Rinse, BID, Chand, Sudham, MD, 15 mL at 05/07/21 1100   midodrine (PROAMATINE) tablet 10 mg, 10 mg, Oral, TID WC, Barb Merino, MD, 10 mg at 05/07/21 1637   multivitamin with minerals tablet 1 tablet, 1 tablet, Oral, Daily, Jacky Kindle, MD, 1 tablet at 05/07/21 1041   nystatin (MYCOSTATIN/NYSTOP) topical powder, , Topical, TID, Rosezella Rumpf, NP, Given  at 05/07/21 1638   ondansetron (ZOFRAN-ODT) disintegrating tablet 8 mg, 8 mg, Oral, Q8H PRN, Barb Merino, MD, 8 mg at 05/07/21 1050   oxyCODONE (Oxy IR/ROXICODONE) immediate release tablet 5 mg, 5 mg, Oral, Q6H PRN, Rosezella Rumpf, NP, 5 mg at 05/07/21 1427   pantoprazole (PROTONIX) EC tablet 40 mg, 40 mg, Oral, QHS, Chand, Sudham, MD, 40 mg at 05/06/21 2111   pregabalin (LYRICA) capsule 50 mg, 50 mg, Oral, BID, Ghimire, Kuber, MD, 50 mg at 05/07/21 1042   rifaximin (XIFAXAN) tablet 550 mg, 550 mg, Oral, BID, Vena Rua, PA-C, 550 mg at 05/07/21 1041   [COMPLETED] sertraline (ZOLOFT) tablet 25 mg, 25 mg, Oral, QPC lunch, 25 mg at 04/27/21 1138 **FOLLOWED BY** sertraline (ZOLOFT) tablet 25 mg, 25 mg, Oral, QPC breakfast, Merrily Brittle, DO, 25 mg at 05/07/21 0846   sodium chloride (OCEAN) 0.65 % nasal spray 1 spray, 1 spray, Each Nare, PRN, Jacky Kindle, MD  Allergies: Allergies  Allergen Reactions   Bee Venom Anaphylaxis   Erythromycin Anaphylaxis   Cephalexin    Neomycin-Bacitracin-Polymyxin  [Bacitracin-Neomycin-Polymyxin] Swelling    Sight of application   Neosporin Original [Bacitracin-Neomycin-Polymyxin] Swelling    Swelling at site of application   Prednisone    Sulfa Antibiotics Swelling    Sulfa eye drops caused eyes to swell Sulfa eye drops caused eyes to swell   Tobrex [Tobramycin] Swelling   Tramadol Hives   Penicillins Rash    Has patient had a PCN reaction causing immediate rash, facial/tongue/throat swelling, SOB or lightheadedness with hypotension: Yes Has patient had a PCN reaction causing severe rash involving mucus membranes or skin necrosis: No Has patient had a PCN reaction that required hospitalization: No Has patient had a PCN reaction occurring within the last 10 years: No If all of the above answers are "NO", then may proceed with Cephalosporin use.     Objective  Psychiatric Specialty Exam: Physical Exam Vitals and nursing note reviewed.   Constitutional:      General: She is not in acute distress.    Appearance: She is ill-appearing. She is not diaphoretic.     Comments: jaundiced  HENT:     Head: Normocephalic.  Pulmonary:     Effort: No respiratory distress.  Musculoskeletal:     Comments: Decreased strength b/l extremities, difficulty raising arms.   Neurological:     General: No focal deficit present.     Mental Status: She is lethargic.     Review of Systems  Constitutional:  Positive for malaise/fatigue.  Respiratory:  Positive for shortness of breath.   Gastrointestinal:  Negative for nausea.  Psychiatric/Behavioral:         Illusions    Body mass index is 29.28 kg/m. Temp:  [97.9 F (36.6 C)-98.8 F (37.1 C)] 98.1 F (36.7 C) (03/06 1600) Pulse Rate:  [89-102] 91 (03/06 1600) Cardiac Rhythm: Sinus tachycardia (03/06 0745) Resp:  [8-18] 10 (03/06 1600)  BP: (81-112)/(61-75) 98/65 (03/06 1600) SpO2:  [94 %-97 %] 97 % (03/06 1600) Weight:  [68 kg] 68 kg (03/06 1100)  General Appearance: Appropriate for Environment    Eye Contact: Poor (kept drifting off to sleep)    Speech: Slow    Volume: Decreased    Mood: -- (OK)   Affect: -- (sedated)    Thought Process: Coherent; Goal Directed  Descriptions of Associations: Circumstantial  Duration of Psychotic Symptoms:No data recorded Past Diagnosis of Schizophrenia or Psychoactive disorder:No data recorded  Orientation: Full (Time, Place and Person)   Thought Content: -- (minimal spontaneous thought)  Hallucinations: Hallucinations: None    Ideas of Reference: None   Suicidal Thoughts: No  Suicidal Thoughts: No    Homicidal Thoughts: No  Homicidal Thoughts: No     Memory: Immediate Fair; Recent Fair; Remote Fair    Judgement: Fair  Insight: Poor    Psychomotor Activity: Decreased; Psychomotor Retardation    Concentration: Poor  Attention Span: Poor   Recall: Poor    Fund of Knowledge: Poor     Language: Good    Handed: Right    Assets: Leisure Time; Social Support    Sleep: Fair     AIMS:   , ,  ,  ,     CIWA:  COWS:      Signed: [place] [facility] Resident, PGY-1 [place] 05/07/2021, 4:53 PM

## 2021-05-07 NOTE — Progress Notes (Signed)
Occupational Therapy Treatment ?Patient Details ?Name: Sharon Lawrence ?MRN: [id number] ?DOB: Mar 19, 1965 ?Today's Date: 05/07/2021 ? ? ?History of present illness Pt presented to [facility] for nausea and vomiting on 2/19. Pt with septic shock and UTI. PMH - STEMI, ADHD, anxiety, chronic pain, CAD, Celiac disease, encephalopthy ?  ?OT comments ? Pt continues to decline functionally, now requiring max assist for completion of simulated feeding tasks as well as grooming tasks with HOB elevated.  She continues to also need total +2 (pt 20%) for rolling in the bed as well as for transitions supine to sit and sit to supine.  Sitting balance EOB is at total assist as well with ataxia movements in her UEs and decreased AROM and control at the shoulders and elbows bilaterally.  HR at 108 with respirations at 18 and BP at 103/62 while sitting.  Feel she will likely need SNF for follow-up rehab as PTA she could transfer to surfaces including her wheelchair at min assist level with a single point cane per report from her and family.  AIR may also be an option but unsure if she could tolerate 3 hrs of intense therapy at this time.    ? ?Recommendations for follow up therapy are one component of a multi-disciplinary discharge planning process, led by the attending physician.  Recommendations may be updated based on patient status, additional functional criteria and insurance authorization. ?   ?Follow Up Recommendations ? Skilled nursing-short term rehab (<3 hours/day)  ?  ?Assistance Recommended at Discharge Frequent or constant Supervision/Assistance  ?Patient can return home with the following ? Two people to help with walking and/or transfers;A lot of help with bathing/dressing/bathroom;Assist for transportation;Assistance with feeding;Direct supervision/assist for medications management;Two people to help with bathing/dressing/bathroom ?  ?Equipment Recommendations ? Hospital bed;Wheelchair (measurements OT);Wheelchair cushion  (measurements OT);Other (comment) Harrel Lemon)  ?  ?Recommendations for Other Services   ? ?  ?Precautions / Restrictions Precautions ?Precautions: Fall ?Precaution Comments: watch BP, HR, respirations ?Restrictions ?Weight Bearing Restrictions: No  ? ? ?  ? ?Mobility Bed Mobility ?Overal bed mobility: Needs Assistance ?Bed Mobility: Rolling ?Rolling: Total assist, +2 for physical assistance ?  ?Supine to sit: Total assist, +2 for physical assistance ?Sit to supine: Total assist, +2 for physical assistance ?  ?  ?  ? ?Transfers ?  ?  ?  ?  ?  ?  ?  ?  ?  ?  ?  ?  ?Balance Overall balance assessment: Needs assistance ?Sitting-balance support: Bilateral upper extremity supported, Feet supported ?Sitting balance-Leahy Scale: Zero ?Sitting balance - Comments: Pt with increased LOB to the left and posteriorly with decreased head control as well.  Head tilt to the leftt while sitting. ?  ?  ?  ?  ?  ?  ?  ?  ?  ?  ?  ?  ?  ?  ?  ?   ? ?ADL either performed or assessed with clinical judgement  ? ?ADL Overall ADL's : Needs assistance/impaired ?Eating/Feeding: Total assistance;Bed level ?Eating/Feeding Details (indicate cue type and reason): Simulated, pt with increased ataxia movement with decreased bilateral shoulder, elbow, and wrist AROM.  Needs max hand over hand assist for self feeding. ?Grooming: Wash/dry face;Maximal assistance;Bed level ?  ?  ?  ?  ?  ?  ?  ?  ?  ?  ?  ?  ?  ?  ?  ?  ?General ADL Comments: Pt required total assist +2 (pt 20%) for rolling in the  bed to position pillows and reposiiton bed pad.  Total assist for supine to sit as well.  She tolerated sitting EOB with total assist for static sitting for 10 mins while nursing gave her medications and completed their assessment.   Educated pt's son on AAROM exercises for the shoulders, elbow, and wrist to assist with increasing ROM for selfcare tasks. ?  ? ? ?   ?   ?   ? ?Cognition Arousal/Alertness: Awake/alert ?Behavior During Therapy: Flat affect ?Overall  Cognitive Status: Impaired/Different from baseline ?Area of Impairment: Awareness ?  ?  ?  ?  ?  ?  ?  ?  ?  ?  ?  ?Following Commands: Follows one step commands with increased time, Follows one step commands consistently ?Safety/Judgement: Decreased awareness of deficits ?  ?  ?  ?  ?  ?   ?   ?   ?   ? ? ?Pertinent Vitals/ Pain       Pain Assessment ?Pain Assessment: Faces ?Faces Pain Scale: Hurts little more ?Pain Location: shoulders and hips per report ?Pain Descriptors / Indicators: Discomfort ?Pain Intervention(s): Monitored during session, Limited activity within patient's tolerance ? ?   ?   ? ?Frequency ? Min 2X/week  ? ? ? ? ?  ?Progress Toward Goals ? ?OT Goals(current goals can now be found in the care plan section) ? Progress towards OT goals: Not progressing toward goals - comment (Pt continues to decline functionally since eval) ? ?Acute Rehab OT Goals ?Patient Stated Goal: She wants to get her strength back. ?OT Goal Formulation: With patient ?Time For Goal Achievement: 05/21/21 ?Potential to Achieve Goals: Fair  ?Plan Discharge plan needs to be updated   ? ?Co-evaluation ? ? ?   ?  ?  ?  ?  ? ?  ?AM-PAC OT "6 Clicks" Daily Activity     ?Outcome Measure ? ? Help from another person eating meals?: A Lot ?Help from another person taking care of personal grooming?: A Lot ?Help from another person toileting, which includes using toliet, bedpan, or urinal?: Total ?Help from another person bathing (including washing, rinsing, drying)?: A Lot ?Help from another person to put on and taking off regular upper body clothing?: Total ?Help from another person to put on and taking off regular lower body clothing?: Total ?6 Click Score: 9 ? ?  ?End of Session   ? ?OT Visit Diagnosis: Unsteadiness on feet (R26.81);Muscle weakness (generalized) (M62.81);Feeding difficulties (R63.3);Ataxia, unspecified (R27.0);Pain ?Pain - Right/Left: Left ?Pain - part of body: Shoulder;Hip ?  ?Activity Tolerance Patient limited by  pain;Patient limited by fatigue ?  ?Patient Left in bed;with call bell/phone within reach;with family/visitor present;with bed alarm set ?  ?Nurse Communication Mobility status ?  ? ?   ? ?Time: [phone] ?OT Time Calculation (min): 49 min ? ?Charges: OT General Charges ?$OT Visit: 1 Visit ?OT Treatments ?$Self Care/Home Management : 38-52 mins ? ?Christin Mccreedy OTR/L ? ?05/07/2021, 9:33 AM ?

## 2021-05-07 NOTE — Progress Notes (Signed)
Nutrition Follow-up ? ?DOCUMENTATION CODES:  ? ?Non-severe (moderate) malnutrition in context of chronic illness ? ?INTERVENTION:  ? ?Continue Multivitamin w/ minerals daily ?Continue Boost Breeze po TID, each supplement provides 250 kcal and 9 grams of protein ?Meal ordering with assist  ?Feeding assistance with all meals ?Recommend obtaining new weight since last weight is >7 days ago. RN notified.  ? ?NUTRITION DIAGNOSIS:  ? ?Moderate Malnutrition related to chronic illness (NASH, celiac disease) as evidenced by mild fat depletion, mild muscle depletion. - Ongoing ? ?GOAL:  ? ?Patient will meet greater than or equal to 90% of their needs - Ongoing  ? ?MONITOR:  ? ?PO intake, Supplement acceptance, Labs, Weight trends, Skin ? ?REASON FOR ASSESSMENT:  ? ?Consult ?Assessment of nutrition requirement/status ? ?ASSESSMENT:  ? ?56 yo female admitted with septic vs hypovolemic shock, acute UTI, decompensated liver cirrhosis. PMH  includes CAD, STEMI, celiac disease, GIB, NASH, chronic pain. ? ?2/20 - Paracentesis  ?3/02 - Liver Biopsy (pending pathology for malignancy)  ? ?Pt taking meds with RN assistance. Family at bedside. RN and family member provide update on pt. ? ?Family reports that she ate fairly well this morning for breakfast. Reports that he had just ordered her lunch and supper tray. RN reports that she did not want the Boost Breeze this morning due to drinking lactulose and eating breakfast. RN reports that pt has had some nausea but no episodes of emesis.  ? ?No additional meal intake recorded within EMR since previous RD visit.   ? ?Will make pt meal ordering with assist so that family or RN can help pt make meal selection that pt likes to increase PO intake.  ? ?Discussed that ONS is there to provide supplementation for if the pt is not eating well.  ? ?Medications reviewed and include: Lactulose, MVI, Protonix, Rifaximin,  ?Labs reviewed. ? ? ?Diet Order:   ?Diet Order   ? ?       ?  Diet gluten free  Room service appropriate? No; Fluid consistency: Thin  Diet effective now       ?  ? ?  ?  ? ?  ? ? ?EDUCATION NEEDS:  ? ?Not appropriate for education at this time ? ?Skin:  Skin Assessment: Skin Integrity Issues: ?Skin Integrity Issues:: Stage II ?Stage II: sacrum ? ?Last BM:  3/5 - Type 7 ? ?Height:  ? ?Ht Readings from Last 1 Encounters:  ?04/22/21 5' (1.524 m)  ? ? ?Weight:  ? ?Wt Readings from Last 1 Encounters:  ?04/22/21 82.3 kg  ? ? ?Ideal Body Weight:  45.5 kg ? ?BMI:  Body mass index is 35.43 kg/m?. ? ?Estimated Nutritional Needs:  ? ?Kcal:  2000-2300 ? ?Protein:  90-105 gm ? ?Fluid:  1.8-2 L ? ? ? ?Hermina Barters RD, LDN ?Clinical Dietitian ?See [facility] for contact information.  ? ?

## 2021-05-07 NOTE — Plan of Care (Signed)

## 2021-05-08 ENCOUNTER — Other Ambulatory Visit (HOSPITAL_COMMUNITY): Payer: Self-pay

## 2021-05-08 ENCOUNTER — Inpatient Hospital Stay (HOSPITAL_COMMUNITY): Payer: Self-pay

## 2021-05-08 ENCOUNTER — Encounter (HOSPITAL_COMMUNITY): Payer: Self-pay | Admitting: Emergency Medicine

## 2021-05-08 LAB — CBC WITH DIFFERENTIAL/PLATELET
Abs Immature Granulocytes: 0.7 10*3/uL — ABNORMAL HIGH (ref 0.00–0.07)
Basophils Absolute: 0 10*3/uL (ref 0.0–0.1)
Basophils Relative: 0 %
Eosinophils Absolute: 0 10*3/uL (ref 0.0–0.5)
Eosinophils Relative: 0 %
HCT: 25.9 % — ABNORMAL LOW (ref 36.0–46.0)
Hemoglobin: 8.4 g/dL — ABNORMAL LOW (ref 12.0–15.0)
Lymphocytes Relative: 10 %
Lymphs Abs: 2.4 10*3/uL (ref 0.7–4.0)
MCH: 28.3 pg (ref 26.0–34.0)
MCHC: 32.4 g/dL (ref 30.0–36.0)
MCV: 87.2 fL (ref 80.0–100.0)
Monocytes Absolute: 1.5 10*3/uL — ABNORMAL HIGH (ref 0.1–1.0)
Monocytes Relative: 6 %
Myelocytes: 2 %
Neutro Abs: 19.6 10*3/uL — ABNORMAL HIGH (ref 1.7–7.7)
Neutrophils Relative %: 81 %
Platelets: 331 10*3/uL (ref 150–400)
Promyelocytes Relative: 1 %
RBC: 2.97 MIL/uL — ABNORMAL LOW (ref 3.87–5.11)
RDW: 20.6 % — ABNORMAL HIGH (ref 11.5–15.5)
WBC: 24.2 10*3/uL — ABNORMAL HIGH (ref 4.0–10.5)
nRBC: 0 % (ref 0.0–0.2)
nRBC: 0 /100 WBC

## 2021-05-08 LAB — URINALYSIS, ROUTINE W REFLEX MICROSCOPIC
Bilirubin Urine: NEGATIVE
Glucose, UA: NEGATIVE mg/dL
Ketones, ur: NEGATIVE mg/dL
Leukocytes,Ua: NEGATIVE
Nitrite: NEGATIVE
Protein, ur: 30 mg/dL — AB
Specific Gravity, Urine: 1.02 (ref 1.005–1.030)
pH: 5 (ref 5.0–8.0)

## 2021-05-08 LAB — COMPREHENSIVE METABOLIC PANEL
ALT: 50 U/L — ABNORMAL HIGH (ref 0–44)
AST: 118 U/L — ABNORMAL HIGH (ref 15–41)
Albumin: 2.1 g/dL — ABNORMAL LOW (ref 3.5–5.0)
Alkaline Phosphatase: 272 U/L — ABNORMAL HIGH (ref 38–126)
Anion gap: 10 (ref 5–15)
BUN: 19 mg/dL (ref 6–20)
CO2: 16 mmol/L — ABNORMAL LOW (ref 22–32)
Calcium: 7.8 mg/dL — ABNORMAL LOW (ref 8.9–10.3)
Chloride: 97 mmol/L — ABNORMAL LOW (ref 98–111)
Creatinine, Ser: 1.19 mg/dL — ABNORMAL HIGH (ref 0.44–1.00)
GFR, Estimated: 55 mL/min — ABNORMAL LOW (ref >60)
Glucose, Bld: 102 mg/dL — ABNORMAL HIGH (ref 70–99)
Potassium: 4.5 mmol/L (ref 3.5–5.1)
Sodium: 123 mmol/L — ABNORMAL LOW (ref 135–145)
Total Bilirubin: 4.4 mg/dL — ABNORMAL HIGH (ref 0.3–1.2)
Total Protein: 5.2 g/dL — ABNORMAL LOW (ref 6.5–8.1)

## 2021-05-08 LAB — PHOSPHORUS: Phosphorus: 3.2 mg/dL (ref 2.5–4.6)

## 2021-05-08 LAB — SURGICAL PATHOLOGY

## 2021-05-08 LAB — PROCALCITONIN: Procalcitonin: 0.61 ng/mL

## 2021-05-08 LAB — MAGNESIUM: Magnesium: 2.4 mg/dL (ref 1.7–2.4)

## 2021-05-08 MED ORDER — SODIUM CHLORIDE 0.9 % IV BOLUS
1000.0000 mL | Freq: Once | INTRAVENOUS | Status: AC
  Administered 2021-05-08: 1000 mL via INTRAVENOUS

## 2021-05-08 MED ORDER — ALBUMIN HUMAN 25 % IV SOLN
25.0000 g | Freq: Once | INTRAVENOUS | Status: AC
  Administered 2021-05-08: 25 g via INTRAVENOUS
  Filled 2021-05-08: qty 100

## 2021-05-08 MED ORDER — SODIUM CHLORIDE 0.9 % IV SOLN: INTRAVENOUS | Status: DC

## 2021-05-08 MED ORDER — VANCOMYCIN HCL 1500 MG/300ML IV SOLN
1500.0000 mg | Freq: Once | INTRAVENOUS | Status: AC
  Administered 2021-05-08: 1500 mg via INTRAVENOUS
  Filled 2021-05-08: qty 300

## 2021-05-08 MED ORDER — VANCOMYCIN HCL IN DEXTROSE 1-5 GM/200ML-% IV SOLN
1000.0000 mg | INTRAVENOUS | Status: DC
  Administered 2021-05-09: 1000 mg via INTRAVENOUS
  Filled 2021-05-08 (×2): qty 200

## 2021-05-08 NOTE — Plan of Care (Signed)

## 2021-05-08 NOTE — Progress Notes (Signed)
Physical Therapy Treatment ?Patient Details ?Name: Sharon Lawrence ?MRN: [id number] ?DOB: January 29, 1966 ?Today's Date: 05/08/2021 ? ? ?History of Present Illness Pt presented to [facility] ED for nausea and vomiting on 2/19. Pt with septic shock and UTI. PMH - STEMI, ADHD, anxiety, chronic pain, CAD, Celiac disease, encephalopthy ? ?  ?PT Comments  ? ? Pt drowsy, but agreeable to PT session. Focus on bed mobility, sitting balance, and low level exercises. Pt requiring two person total assist for bed mobility. Tolerated sitting edge of bed for ~15 minutes; pt receiving fluid bolus at the time. BP supine 98/59 (70), sitting edge of bed 115/66 (88), sitting edge of bed x 5 minutes 107/92 (97), return to supine 92/61 (71). Continues to need hoyer lift transfer out of bed. ?  ?Recommendations for follow up therapy are one component of a multi-disciplinary discharge planning process, led by the attending physician.  Recommendations may be updated based on patient status, additional functional criteria and insurance authorization. ? ?Follow Up Recommendations ? Home health PT ?  ?  ?Assistance Recommended at Discharge Frequent or constant Supervision/Assistance  ?Patient can return home with the following Two people to help with walking and/or transfers;A lot of help with bathing/dressing/bathroom;Assistance with cooking/housework;Direct supervision/assist for medications management;Assist for transportation;Assistance with feeding ?  ?Equipment Recommendations ? Hospital bed;Other (comment) (hoyer lift)  ?  ?Recommendations for Other Services   ? ? ?  ?Precautions / Restrictions Precautions ?Precautions: Fall ?Precaution Comments: watch BP ?Restrictions ?Weight Bearing Restrictions: No  ?  ? ?Mobility ? Bed Mobility ?Overal bed mobility: Needs Assistance ?Bed Mobility: Supine to Sit, Sit to Supine ?  ?  ?Supine to sit: Total assist, +2 for physical assistance ?Sit to supine: Total assist, +2 for physical assistance ?  ?General bed mobility  comments: Pt requiring totalA + 2 for all aspects of bed mobility ?  ? ?Transfers ?  ?  ?  ?  ?  ?  ?  ?  ?  ?General transfer comment: unable ?  ? ?Ambulation/Gait ?  ?  ?  ?  ?  ?  ?  ?  ? ? ?Stairs ?  ?  ?  ?  ?  ? ? ?Wheelchair Mobility ?  ? ?Modified Rankin (Stroke Patients Only) ?  ? ? ?  ?Balance Overall balance assessment: Needs assistance ?Sitting-balance support: Bilateral upper extremity supported, Feet supported ?Sitting balance-Leahy Scale: Zero ?Sitting balance - Comments: Pt requiring maxA for sitting balance, posterior lean and decreased head control. ?  ?  ?  ?  ?  ?  ?  ?  ?  ?  ?  ?  ?  ?  ?  ?  ? ?  ?Cognition Arousal/Alertness: Lethargic ?Behavior During Therapy: Flat affect ?Overall Cognitive Status: Impaired/Different from baseline ?Area of Impairment: Awareness, Attention ?  ?  ?  ?  ?  ?  ?  ?  ?  ?Current Attention Level: Focused, Sustained ?  ?Following Commands: Follows one step commands with increased time, Follows one step commands inconsistently ?Safety/Judgement: Decreased awareness of deficits ?Awareness: Emergent ?Problem Solving: Slow processing, Requires verbal cues, Difficulty sequencing ?General Comments: Pt drowsy, requires repeated cues to sustain attention to task. slow processing ?  ?  ? ?  ?Exercises General Exercises - Upper Extremity ?Digit Composite Flexion: Both, 5 reps, Seated, Squeeze ball ?General Exercises - Lower Extremity ?Long Arc Quad: Both, 10 reps, Seated ?Other Exercises ?Other Exercises: Sitting: scapular retractions x 5, cervical rotation to R/L x  3, cervical flexion/extension x 3 ? ?  ?General Comments   ?  ?  ? ?Pertinent Vitals/Pain Pain Assessment ?Pain Assessment: Faces ?Faces Pain Scale: Hurts little more ?Pain Location: generalized ?Pain Descriptors / Indicators: Discomfort ?Pain Intervention(s): Limited activity within patient's tolerance, Monitored during session, Premedicated before session  ? ? ?Home Living Family/patient expects to be discharged  to:: Unsure ?Living Arrangements: Children;Other relatives ?  ?  ?  ?  ?  ?  ?  ?  ?   ?  ?Prior Function    ?  ?  ?   ? ?PT Goals (current goals can now be found in the care plan section) Acute Rehab PT Goals ?Patient Stated Goal: did not state ?Time For Goal Achievement: 05/22/21 ?Potential to Achieve Goals: Fair ?Progress towards PT goals: Not progressing toward goals - comment ? ?  ?Frequency ? ? ? Min 2X/week ? ? ? ?  ?PT Plan Current plan remains appropriate  ? ? ?Co-evaluation   ?  ?  ?  ?  ? ?  ?AM-PAC PT "6 Clicks" Mobility   ?Outcome Measure ? Help needed turning from your back to your side while in a flat bed without using bedrails?: Total ?Help needed moving from lying on your back to sitting on the side of a flat bed without using bedrails?: Total ?Help needed moving to and from a bed to a chair (including a wheelchair)?: Total ?Help needed standing up from a chair using your arms (e.g., wheelchair or bedside chair)?: Total ?Help needed to walk in hospital room?: Total ?Help needed climbing 3-5 steps with a railing? : Total ?6 Click Score: 6 ? ?  ?End of Session   ?Activity Tolerance: Patient limited by fatigue ?Patient left: in bed;with call bell/phone within reach;with bed alarm set ?Nurse Communication: Mobility status;Need for lift equipment ?PT Visit Diagnosis: Unsteadiness on feet (R26.81);Other abnormalities of gait and mobility (R26.89);History of falling (Z91.81) ?  ? ? ?Time: 1030-1103 ?PT Time Calculation (min) (ACUTE ONLY): 33 min ? ?Charges:  $Therapeutic Activity: 23-37 mins          ?          ? ?Wyona Almas, PT, DPT ?Acute Rehabilitation Services ?Pager [phone] ?Office [phone] ? ? ? ?Deno Etienne ?05/08/2021, 1:15 PM ? ?

## 2021-05-08 NOTE — Progress Notes (Signed)
Pt drowsy, refused night time medications. RN educated pt on the importance of taking her medication. Kristopher Oppenheim MD notified. No new orders at this time.  ?

## 2021-05-08 NOTE — Progress Notes (Signed)
Patient Sharon Lawrence.  Patient previously yellow/ Sharon Lawrence.  No changes to assessment.  Ghimire MD aware of blood pressure and heart rate at bedside. No order changes for vital signs.  Per MD conversation with family at bedside, if vital signs worsen, we will not increase level of care.   ?

## 2021-05-08 NOTE — Progress Notes (Signed)
PROGRESS NOTE    Sharon Lawrence  JJH:[id number] DOB: [birth year] DOA: 04/22/2021 PCP: Lindell Spar, MD    Brief Narrative:  56 year old with history of coronary artery disease, STEMI in 2019, celiac disease, [name] cirrhosis and GI bleeding, chronic pain syndrome presented with generalized weakness, nausea vomiting and abdominal pain.  She was admitted to intensive care unit with initial low blood pressures on vasopressors.  SBP was ruled out with negative peritoneal fluid studies.  Urine culture showed yeast.  Underwent MRI of the liver that showed cirrhosis, hematomas and moderate ascites with anasarca.  Patient had extensive weakness, failure to thrive, decreased oral intake and encephalopathy from hyperammonemia.  She had episodes of hypotension responding to IV fluids.  Remains in the hospital, very debilitated.  Liver biopsy is pending.  3/7 ,with initial improvement of hemodynamics patient again started decompensating, low blood pressures and tachycardia. Repeat sepsis work-up today.  Poor prognosis.  See goal of care discussions below.   Assessment & Plan:   Shock: Initial suspected hypovolemic shock.  Improved with decompensation as below.  Continue midodrine 10 mg 3 times a day.  Severe sepsis, developed during hospitalization: 3/7, patient with low blood pressures, neutrophilic leukocytosis, tachycardia.  Primary source unknown. Blood cultures x2 drawn Urinalysis and urine cultures sent Chest x-ray no evidence of abnormalities Procalcitonin 0.6 Ascites fluid culture 2/20 negative.  2 L normal saline bolus, will start on vancomycin and cefepime, primary source unknown.  Decompensated cirrhosis with ascites and history of NASH/hepatic and Metabolic encephalopathy. Work-up negative for autoimmune hepatitis and viral hepatitis.  Progressive cirrhosis and portal hypertension. MRI of the liver consistent with hepatic lesions,  Patient underwent core liver biopsy and liver lesion biopsy  3/2, Core liver biopsy consistent with cirrhosis.  Liver lesion biopsy and cytopathology pending.  LFTs abnormal but is stable.  Acute on chronic microcytic anemia, acute thrombocytopenia in the setting of liver cirrhosis and history of GI bleeding: Received 2 units of PRBC with improvement of hemoglobin.  Platelets are stable.   Sacral decubitus ulcer: Stage II.  Present on admission.  Pictures in the chart..  Dressing.  Goal of care: Failure to thrive/moderate malnutrition/advanced liver disease: With multiple issues and advanced liver disease, patient has poor prognosis and outcome. She has been bedbound with poor mobility and extreme debility. Seen by gastroenterology, prognosis is poor.  Recommended home with home hospice.  Family insisted on waiting for biopsy results. 3/7, with changing clinical status, goal of care is addressed. Sister at the bedside, we decided to start antibiotics.  Detailed discussion that patient may not survive this episode of decompensation.  We have come to conclusion that if reasonable treatment does not help in the medical floor and patient becomes more sicker, we will not transfer her to ICU instead change her to comfort care. We will continue to use low-dose oxycodone and Lyrica for symptom control. I will reach out to her primary gastroenterologist Dr. Laural Golden in [place] with home patient and family has good rapport.    DVT prophylaxis: Place and maintain sequential compression device Start: 04/22/21 2229   Code Status: DNR Family Communication: Sister Holly at the bedside. Disposition Plan: Status is: Inpatient Remains inpatient appropriate because:  Inadequate oral intake, unable to mobilize.  Sepsis.   Consultants:  Gastroenterology Palliative  Procedures:  Paracentesis Liver biopsy 3/2  Antimicrobials:  Vancomycin and cefepime 3/7---   Subjective  Seen and examined.  Sister at the bedside.  Bedside nurse and patient's sister were  trying  to feed her breakfast.  Patient herself denies any complaints.  She tells me her pain is better controlled today with oxycodone.  Mostly sleepy, tired and lethargic.  Overnight episodic low blood pressures, earlier she developed tachycardia with heart rate more than 100 and dropping blood pressures.  Objective: Vitals:   05/08/21 0900 05/08/21 1000 05/08/21 1100 05/08/21 1158  BP: (!) 87/55 (!) 84/57 92/61 (!) 79/47  Pulse: (!) 109 (!) 102 (!) 103 92  Resp: 10 14 10 10   Temp:    98.2 F (36.8 C)  TempSrc:    Axillary  SpO2: 94% 96% 98% 95%  Weight:      Height:        Intake/Output Summary (Last 24 hours) at 05/08/2021 1409 Last data filed at 05/08/2021 1046 Gross per 24 hour  Intake --  Output 1 ml  Net -1 ml    Filed Weights   04/22/21 1410 05/07/21 1100  Weight: 82.3 kg 68 kg    Examination:  General exam: Chronically sick looking.  Frail and debilitated.  Icteric.  Ecchymotic.  On room air.  Not in any distress. Respiratory system: Clear to auscultation. Respiratory effort normal.  On room air. Cardiovascular system: S1 & S2 heard, RRR.  Anasarca. Gastrointestinal system: Distended obese and pendulous.  Nontender.  Bowel sound present. Central nervous system: Alert and oriented.  Extremely debilitated.  Unable to move extremities well. Skin excoriation and sloughing of the buttock region.   Data Reviewed: I have personally reviewed following labs and imaging studies  CBC: Recent Labs  Lab 05/03/21 0345 05/04/21 0228 05/08/21 0139  WBC 19.4* 20.0* 24.2*  NEUTROABS  --  13.2* 19.6*  HGB 7.5* 8.0* 8.4*  HCT 21.9* 24.1* 25.9*  MCV 82.0 83.4 87.2  PLT 143* 190 563   Basic Metabolic Panel: Recent Labs  Lab 05/02/21 0348 05/04/21 0228 05/05/21 0034 05/08/21 0139  NA 131* 127* 128* 123*  K 3.2* 5.1 5.0 4.5  CL 102 101 102 97*  CO2 20* 19* 19* 16*  GLUCOSE 95 106* 94 102*  BUN 15 17 17 19   CREATININE 0.98 0.89 0.85 1.19*  CALCIUM 8.0* 7.7* 7.8* 7.8*   MG  --  2.4  --  2.4  PHOS  --  2.5  --  3.2   GFR: Estimated Creatinine Clearance: 47 mL/min (A) (by C-G formula based on SCr of 1.19 mg/dL (H)). Liver Function Tests: Recent Labs  Lab 05/02/21 0348 05/04/21 0228 05/05/21 0034 05/08/21 0139  AST 90* 115* 115* 118*  ALT 31 42 40 50*  ALKPHOS 148* 262* 272* 272*  BILITOT 4.8* 4.9* 5.1* 4.4*  PROT 4.7* 4.9* 5.0* 5.2*  ALBUMIN 2.0* 2.2* 2.2* 2.1*   No results for input(s): LIPASE, AMYLASE in the last 168 hours. No results for input(s): AMMONIA in the last 168 hours.  Coagulation Profile: Recent Labs  Lab 05/02/21 0348 05/03/21 0345  INR 1.8* 1.7*   Cardiac Enzymes: No results for input(s): CKTOTAL, CKMB, CKMBINDEX, TROPONINI in the last 168 hours. BNP (last 3 results) No results for input(s): PROBNP in the last 8760 hours. HbA1C: No results for input(s): HGBA1C in the last 72 hours. CBG: No results for input(s): GLUCAP in the last 168 hours.  Lipid Profile: No results for input(s): CHOL, HDL, LDLCALC, TRIG, CHOLHDL, LDLDIRECT in the last 72 hours. Thyroid Function Tests: No results for input(s): TSH, T4TOTAL, FREET4, T3FREE, THYROIDAB in the last 72 hours. Anemia Panel: No results for input(s): VITAMINB12, FOLATE, FERRITIN, TIBC, IRON,  RETICCTPCT in the last 72 hours. Sepsis Labs: Recent Labs  Lab 05/02/21 0348 05/03/21 0345 05/08/21 0803  PROCALCITON 0.81 0.90 0.61    Recent Results (from the past 240 hour(s))  Culture, blood (routine x 2)     Status: None (Preliminary result)   Collection Time: 05/08/21  8:03 AM   Specimen: BLOOD RIGHT HAND  Result Value Ref Range Status   Specimen Description BLOOD RIGHT HAND  Final   Special Requests   Final    BOTTLES DRAWN AEROBIC ONLY Blood Culture results may not be optimal due to an inadequate volume of blood received in culture bottles   Culture   Final    NO GROWTH < 12 HOURS Performed at [facility] [address], [place], [place] [address]     Report Status PENDING  Incomplete  Culture, blood (routine x 2)     Status: None (Preliminary result)   Collection Time: 05/08/21  8:10 AM   Specimen: BLOOD RIGHT HAND  Result Value Ref Range Status   Specimen Description BLOOD RIGHT HAND  Final   Special Requests   Final    BOTTLES DRAWN AEROBIC AND ANAEROBIC Blood Culture adequate volume   Culture   Final    NO GROWTH < 12 HOURS Performed at [facility], [place] [address] [place], [place] [address]    Report Status PENDING  Incomplete          Radiology Studies: DG CHEST PORT 1 VIEW  Result Date: 05/08/2021 CLINICAL DATA:  Acute sepsis.  Pneumonia. EXAM: PORTABLE CHEST 1 VIEW COMPARISON:  05/03/2021 FINDINGS: Heart size remains within normal limits. Low lung volumes are again demonstrated. Atelectasis or infiltrate in the left lung base shows no significant change. Right lung remains clear. IMPRESSION: Low lung volumes, with no significant change in left basilar atelectasis versus infiltrate. Electronically Signed   By: Marlaine Hind M.D.   On: 05/08/2021 08:36        Scheduled Meds:  feeding supplement  1 Container Oral TID BM   lactulose  30 g Oral TID   mouth rinse  15 mL Mouth Rinse BID   midodrine  10 mg Oral TID WC   multivitamin with minerals  1 tablet Oral Daily   nystatin   Topical TID   pantoprazole  40 mg Oral QHS   pregabalin  50 mg Oral BID   rifaximin  550 mg Oral BID   sertraline  25 mg Oral QPC breakfast   Continuous Infusions:  sodium chloride Stopped (04/23/21 0504)   sodium chloride     [START ON 05/09/2021] vancomycin       LOS: 16 days    Time spent: 35 minutes    Barb Merino, MD [facility] Pager [phone]

## 2021-05-08 NOTE — Progress Notes (Signed)
Patient ID:Sharon Lawrence      DOB: 1965-11-26      TWS:[id number] ? ? ?Palliative Medicine Team at [facility] ?Progress Note ? ? ? ?Subjective: Patient visited bedside for symptom check and follow up.  ? ? ?Physical exam: Patient resting comfortably in bed without signs of distress or pain. Breathing even and unlabored, heart rate low 100s. Patient sleeping and not woken for symptom follow up at this time.  ? ? ?Assessment and plan: This RN spoke with sister, [place] bedside. Per [facility] no patient needs at this time, still waiting for biopsy results to help direct goals of care decisions. [name] did express some frustrations with overall course of treatment for her sister but no new concerns that needed attention. Will continue to follow for any changes or advancements.  ? ? ? ?Thank you for allowing the Palliative Medicine Team to assist in the care of this patient. ?  ?  ?Damian Leavell, MSN, RN ?Palliative Medicine Team ?[phone]  ?

## 2021-05-08 NOTE — Care Plan (Signed)
Patient was reexamined.  Remains about the same.  Blood pressure responded to 2 L of normal saline.  She was also able to get 5 mg oxycodone and improved her back pain.  Remains very debilitated and hardly can keep up with conversation. ? ?Patient's father at the bedside, he is a retired Therapist, sports who worked at [employer] at [employer] for long time. ?I gave him results from liver biopsy that shows stage IV cirrhosis, nodule biopsy does not show any evidence of malignancy. ?I called and discussed with Dr. Laural Golden who is patient's primary gastroenterologist and known patient for long time.  Dr. Laural Golden was out of country for about a week, however we were able to discuss Sharon Lawrence's care and current clinical situation.  Dr. Laural Golden agreed given overall poor prognosis and suffering that hospice is appropriate level of care for the patient. ?I updated my conversations and clinical situation with father at bedside who inquired about home hospice and agreed to arrange meeting with hospice team. [name] will be here tonight and stay with patient. He will update [place].  ? ?- continue current management with goal of comfort and not escalating care instead focus on comfort if any decompensation. ?- call hospice consult  ?- DNR/DNI ? ?

## 2021-05-08 NOTE — Progress Notes (Signed)
Pharmacy Antibiotic Note ? ?Sharon Lawrence is a 56 y.o. female to begin Vancomycin for sepsis coverage. ? Creatinine trended up to 1.19 today, WBC up, BP low > 1L NS bolus given. Blood and cultures sent. Urine culture ordered.  ? ?Plan: ?Vancomycin 1500 mg IV x 1. ?Vancomycin 1gm IV Q q24 hrs to begin 3/8 ?Goal AUC 400-550. ?Expected AUC: 472-550 ?SCr used: 1.0 (has been 0.81-0.98 the past 10 days, 1.19 today. ?Will follow renal function for any need to modify regimen. ?Follow culture data, clinical progress, antibiotic plans. ? ? ?Height: 5' (152.4 cm) ?Weight: 68 kg (149 lb 14.6 oz) ?IBW/kg (Calculated) : 45.5 ? ?Temp (24hrs), Avg:98.2 ?F (36.8 ?C), Min:97.9 ?F (36.6 ?C), Max:98.7 ?F (37.1 ?C) ? ?Recent Labs  ?Lab 05/02/21 ?2633 05/03/21 ?0345 05/04/21 ?0228 05/05/21 ?0034 05/08/21 ?0139  ?WBC  --  19.4* 20.0*  --  24.2*  ?CREATININE 0.98  --  0.89 0.85 1.19*  ?  ?Estimated Creatinine Clearance: 47 mL/min (A) (by C-G formula based on SCr of 1.19 mg/dL (H)).   ? ?Allergies  ?Allergen Reactions  ? Bee Venom Anaphylaxis  ? Erythromycin Anaphylaxis  ? Cephalexin   ? Neomycin-Bacitracin-Polymyxin  [Bacitracin-Neomycin-Polymyxin] Swelling  ?  Sight of application  ? Neosporin Original [Bacitracin-Neomycin-Polymyxin] Swelling  ?  Swelling at site of application  ? Prednisone   ? Sulfa Antibiotics Swelling  ?  Sulfa eye drops caused eyes to swell ?Sulfa eye drops caused eyes to swell  ? Tobrex [Tobramycin] Swelling  ? Tramadol Hives  ? Penicillins Rash  ?  Has patient had a PCN reaction causing immediate rash, facial/tongue/throat swelling, SOB or lightheadedness with hypotension: Yes ?Has patient had a PCN reaction causing severe rash involving mucus membranes or skin necrosis: No ?Has patient had a PCN reaction that required hospitalization: No ?Has patient had a PCN reaction occurring within the last 10 years: No ?If all of the above answers are "NO", then may proceed with Cephalosporin use. ?  ? ? ?Antimicrobials this  admission: ?Levaquin 2/18 ?Ceftriaxone 2/19>>2/25 for UTI ?Metronidazole x 1 on 2/19 ?Fluconazole 2/22 x 1 ?Vancomycin 3/7 >> ? ?Dose adjustments this admission: ?n/a ? ?Microbiology results: ?2/19 MRSA PCR: negative ?2/19 urine: >100K yeast  ?2/19 COVID and flu: negative ?2/20 blood: negative ?2/20 peritoneal fluid: negative ?3/7 blood: ?3/7 urine:  ? ?Thank you for allowing pharmacy to be a part of this patient?s care. ? ?Arty Baumgartner, RPh ?05/08/2021 10:26 AM ? ?

## 2021-05-09 DIAGNOSIS — R109 Unspecified abdominal pain: Secondary | ICD-10-CM

## 2021-05-09 LAB — COMPREHENSIVE METABOLIC PANEL
ALT: 43 U/L (ref 0–44)
AST: 112 U/L — ABNORMAL HIGH (ref 15–41)
Albumin: 2.4 g/dL — ABNORMAL LOW (ref 3.5–5.0)
Alkaline Phosphatase: 246 U/L — ABNORMAL HIGH (ref 38–126)
Anion gap: 8 (ref 5–15)
BUN: 20 mg/dL (ref 6–20)
CO2: 15 mmol/L — ABNORMAL LOW (ref 22–32)
Calcium: 7.4 mg/dL — ABNORMAL LOW (ref 8.9–10.3)
Chloride: 99 mmol/L (ref 98–111)
Creatinine, Ser: 1.25 mg/dL — ABNORMAL HIGH (ref 0.44–1.00)
GFR, Estimated: 52 mL/min — ABNORMAL LOW (ref >60)
Glucose, Bld: 90 mg/dL (ref 70–99)
Potassium: 4.2 mmol/L (ref 3.5–5.1)
Sodium: 122 mmol/L — ABNORMAL LOW (ref 135–145)
Total Bilirubin: 4.3 mg/dL — ABNORMAL HIGH (ref 0.3–1.2)
Total Protein: 5 g/dL — ABNORMAL LOW (ref 6.5–8.1)

## 2021-05-09 LAB — CBC WITH DIFFERENTIAL/PLATELET
Abs Immature Granulocytes: 0.9 10*3/uL — ABNORMAL HIGH (ref 0.00–0.07)
Basophils Absolute: 0 10*3/uL (ref 0.0–0.1)
Basophils Relative: 0 %
Eosinophils Absolute: 0.2 10*3/uL (ref 0.0–0.5)
Eosinophils Relative: 1 %
HCT: 23.9 % — ABNORMAL LOW (ref 36.0–46.0)
Hemoglobin: 7.8 g/dL — ABNORMAL LOW (ref 12.0–15.0)
Lymphocytes Relative: 11 %
Lymphs Abs: 2.6 10*3/uL (ref 0.7–4.0)
MCH: 28.8 pg (ref 26.0–34.0)
MCHC: 32.6 g/dL (ref 30.0–36.0)
MCV: 88.2 fL (ref 80.0–100.0)
Metamyelocytes Relative: 2 %
Monocytes Absolute: 0.9 10*3/uL (ref 0.1–1.0)
Monocytes Relative: 4 %
Neutro Abs: 18.6 10*3/uL — ABNORMAL HIGH (ref 1.7–7.7)
Neutrophils Relative %: 80 %
Platelets: 310 10*3/uL (ref 150–400)
Promyelocytes Relative: 2 %
RBC: 2.71 MIL/uL — ABNORMAL LOW (ref 3.87–5.11)
RDW: 21.2 % — ABNORMAL HIGH (ref 11.5–15.5)
WBC: 23.2 10*3/uL — ABNORMAL HIGH (ref 4.0–10.5)
nRBC: 0 % (ref 0.0–0.2)
nRBC: 0 /100 WBC

## 2021-05-09 LAB — MAGNESIUM: Magnesium: 2.4 mg/dL (ref 1.7–2.4)

## 2021-05-09 LAB — PROCALCITONIN: Procalcitonin: 0.57 ng/mL

## 2021-05-09 MED ORDER — MORPHINE SULFATE (CONCENTRATE) 10 MG/0.5ML PO SOLN
5.0000 mg | ORAL | Status: DC | PRN
  Administered 2021-05-09: 5 mg via ORAL
  Filled 2021-05-09: qty 0.5

## 2021-05-09 MED ORDER — CHLORHEXIDINE GLUCONATE CLOTH 2 % EX PADS
6.0000 | MEDICATED_PAD | Freq: Every day | CUTANEOUS | Status: DC
  Administered 2021-05-09 – 2021-05-10 (×2): 6 via TOPICAL

## 2021-05-09 NOTE — Progress Notes (Addendum)
Manufacturing engineer [facility]) Hospital Liaison: RN note   ? ?Notified by Transition of Care Manger of patient/family request for [facility] services at home after discharge. Chart and patient information under review by [facility] physician. Hospice eligibility confirmed.   ? ?Writer spoke with  sister, [name] to initiate education related to hospice philosophy, services and team approach to care. Holly verbalized understanding of information given.  Please send signed and completed DNR form home with patient/family. Patient will need prescriptions for discharge comfort medications.    ? ?DME needs have been discussed, patient currently has the following equipment in the home: W/C.  Patient/family requests the following DME for delivery to the home:  hospital bed and hoyer lift. [place] equipment manager has been notified and will contact DME provider to arrange delivery to the home.  [name]  is the family member to contact to arrange time of delivery.   She will be going to [facility] home at [place]. [place] [address].  ? ?[facility] aware of the above. Please notify [facility] when patient is ready to leave the unit at discharge. (Call [phone] or [phone] after 5pm.) [facility] information and contact numbers given to [facility].  ?     ?A Please do not hesitate to call with questions.   ? ?Thank you,   ?Farrel Gordon, RN, CCM      ?[facility] Hospital Liaison   [phone] ?

## 2021-05-09 NOTE — Progress Notes (Signed)
[facility]                                                                               Montie Winiarski, is a 56 y.o. female, DOB - 17-Apr-1965, YBO:[id number] Admit date - 04/22/2021    Outpatient Primary MD for the patient is Lindell Spar, MD  LOS - 17  days    Brief summary   56 year old with history of coronary artery disease, STEMI in 2019, celiac disease, [name] cirrhosis and GI bleeding, chronic pain syndrome presented with generalized weakness, nausea vomiting and abdominal pain.  She was admitted to intensive care unit with initial low blood pressures on vasopressors.  SBP was ruled out with negative peritoneal fluid studies.  Urine culture showed yeast.  Underwent MRI of the liver that showed cirrhosis, hematomas and moderate ascites with anasarca.  Patient had extensive weakness, failure to thrive, decreased oral intake and encephalopathy from hyperammonemia.  She had episodes of hypotension responding to IV fluids.  Remains in the hospital, very debilitated.  Liver biopsy is pending.   3/7 ,with initial improvement of hemodynamics patient again started decompensating, low blood pressures and tachycardia.  Poor prognosis. Palliative care consulted and plan for discharge home with hospice once hospital bed is delivered.    Assessment & Plan    Assessment and Plan: Severe sepsis with hypotension Currently on broad-spectrum IV antibiotics Primary source unknown.    Decompensated cirrhosis with ascites in the setting of NASH/hepatic and metabolic encephalopathy So far work-up for autoimmune hepatitis and viral hepatitis is negative. Patient continues to have progressive cirrhosis with portal hypertension. MRI liver consistent with hepatic lesions, core liver biopsy done consistent with cirrhosis and steato hepatitis.    Anemia of acute illness and acute thrombocytopenia:  - sec to liver cirrhosis .  S/p 2 units prbc ,  .  RN Pressure Injury  Documentation: Pressure Injury 04/22/21 Sacrum Right;Mid Stage 2 -  Partial thickness loss of dermis presenting as a shallow open injury with a red, pink wound bed without slough. Pink wound bed (Active)  04/22/21 2145  Location: Sacrum  Location Orientation: Right;Mid  Staging: Stage 2 -  Partial thickness loss of dermis presenting as a shallow open injury with a red, pink wound bed without slough.  Wound Description (Comments): Pink wound bed  Present on Admission: Yes    Goals of care.  Failure to thrive/moderate malnutrition/advanced liver disease: With multiple issues and advanced liver disease, patient has poor prognosis and outcome. She has been bedbound with poor mobility and extreme debility. Seen by gastroenterology, prognosis is poor.  Recommended home with home hospice  Detailed discussion that patient may not survive this episode of decompensation.  We have come to conclusion that if reasonable treatment does not help in the medical floor and patient becomes more sicker, we will not transfer her to ICU instead change her to comfort care  Malnutrition Type:  Nutrition Problem: Moderate Malnutrition Etiology: chronic illness (NASH, celiac disease)   Malnutrition Characteristics:  Signs/Symptoms: mild fat depletion, mild muscle depletion   Nutrition Interventions:  Interventions: Boost Breeze, MVI  Estimated body mass index is 29.28 kg/m as calculated from the following:  Height as of this encounter: 5' (1.524 m).   Weight as of this encounter: 68 kg.  Code Status: DNR DVT Prophylaxis:  Place and maintain sequential compression device Start: 04/22/21 2229   Level of Care: Level of care: Progressive Family Communication: Updated patient's DAD AT BEDSIDE  Disposition Plan:     Remains inpatient appropriate:  d/c home with hospice once DME is delivered.   Procedures:  Biopsy.   Consultants:   GI Palliative care.   Antimicrobials:   Anti-infectives (From  admission, onward)    Start     Dose/Rate Route Frequency Ordered Stop   05/09/21 1100  vancomycin (VANCOCIN) IVPB 1000 mg/200 mL premix        1,000 mg 200 mL/hr over 60 Minutes Intravenous Every 24 hours 05/08/21 1011     05/08/21 1030  vancomycin (VANCOREADY) IVPB 1500 mg/300 mL        1,500 mg 150 mL/hr over 120 Minutes Intravenous  Once 05/08/21 0956 05/08/21 1412   05/01/21 1445  rifaximin (XIFAXAN) tablet 200 mg  Status:  Discontinued        200 mg Oral 2 times daily 05/01/21 1348 05/01/21 1406   05/01/21 1445  rifaximin (XIFAXAN) tablet 550 mg        550 mg Oral 2 times daily 05/01/21 1406     04/25/21 1045  fluconazole (DIFLUCAN) tablet 200 mg        200 mg Oral  Once 04/25/21 0945 04/25/21 1025   04/23/21 1200  cefTRIAXone (ROCEPHIN) 1 g in sodium chloride 0.9 % 100 mL IVPB  Status:  Discontinued        1 g 200 mL/hr over 30 Minutes Intravenous Every 24 hours 04/23/21 1056 04/28/21 1305   04/23/21 1000  levofloxacin (LEVAQUIN) IVPB 500 mg  Status:  Discontinued        500 mg 100 mL/hr over 60 Minutes Intravenous Every 24 hours 04/22/21 1614 04/23/21 1056   04/22/21 1515  levofloxacin (LEVAQUIN) IVPB 750 mg        750 mg 100 mL/hr over 90 Minutes Intravenous  Once 04/22/21 1510 04/22/21 1732   04/22/21 1515  metroNIDAZOLE (FLAGYL) IVPB 500 mg  Status:  Discontinued        500 mg 100 mL/hr over 60 Minutes Intravenous  Once 04/22/21 1510 04/23/21 0821        Medications  Scheduled Meds:  Chlorhexidine Gluconate Cloth  6 each Topical Daily   feeding supplement  1 Container Oral TID BM   lactulose  30 g Oral TID   mouth rinse  15 mL Mouth Rinse BID   midodrine  10 mg Oral TID WC   multivitamin with minerals  1 tablet Oral Daily   nystatin   Topical TID   pantoprazole  40 mg Oral QHS   pregabalin  50 mg Oral BID   rifaximin  550 mg Oral BID   sertraline  25 mg Oral QPC breakfast   Continuous Infusions:  sodium chloride Stopped (04/23/21 0504)   sodium chloride 100  mL/hr at 05/09/21 0222   vancomycin 1,000 mg (05/09/21 1134)   PRN Meds:.sodium chloride, acetaminophen, morphine CONCENTRATE, ondansetron, oxyCODONE, sodium chloride    Subjective:   Annagrace Releford was seen and examined today.  Pt wants to go home in 1 to 2 days.  No other complaints.   Objective:   Vitals:   05/09/21 0400 05/09/21 0806 05/09/21 1150 05/09/21 1645  BP: 96/60 (!) 90/50 (!) 85/53 (!) 99/58  Pulse: Marland Kitchen)  101 (!) 102 95 95  Resp: 12 15 15 15   Temp: 97.9 F (36.6 C) 98 F (36.7 C) 98.2 F (36.8 C) (!) 97.2 F (36.2 C)  TempSrc: Oral Oral Axillary Axillary  SpO2: 97% 97% 95%   Weight:      Height:        Intake/Output Summary (Last 24 hours) at 05/09/2021 1659 Last data filed at 05/09/2021 1134 Gross per 24 hour  Intake 200 ml  Output --  Net 200 ml   Filed Weights   04/22/21 1410 05/07/21 1100  Weight: 82.3 kg 68 kg     Exam General exam: ill appearing lady somnolent.  Respiratory system: diminished at bases.  Cardiovascular system: S1 & S2 heard, RRR. No JVD, Gastrointestinal system: Abdomen is distended, soft, non tender.  Central nervous system: lethargic, minimally responding.  Extremities:  pedal edema.  Skin: No rashes,  Psychiatry: flat affect.     Data Reviewed:  I have personally reviewed following labs and imaging studies   CBC Lab Results  Component Value Date   WBC 23.2 (H) 05/09/2021   RBC 2.71 (L) 05/09/2021   HGB 7.8 (L) 05/09/2021   HCT 23.9 (L) 05/09/2021   MCV 88.2 05/09/2021   MCH 28.8 05/09/2021   PLT 310 05/09/2021   MCHC 32.6 05/09/2021   RDW 21.2 (H) 05/09/2021   LYMPHSABS 2.6 05/09/2021   MONOABS 0.9 05/09/2021   EOSABS 0.2 05/09/2021   BASOSABS 0.0 31/51/7616     Last metabolic panel Lab Results  Component Value Date   NA 122 (L) 05/09/2021   K 4.2 05/09/2021   CL 99 05/09/2021   CO2 15 (L) 05/09/2021   BUN 20 05/09/2021   CREATININE 1.25 (H) 05/09/2021   GLUCOSE 90 05/09/2021   GFRNONAA 52 (L) 05/09/2021    GFRAA >60 09/16/2019   CALCIUM 7.4 (L) 05/09/2021   PHOS 3.2 05/08/2021   PROT 5.0 (L) 05/09/2021   ALBUMIN 2.4 (L) 05/09/2021   LABGLOB 3.2 04/10/2021   AGRATIO 0.8 (L) 04/10/2021   BILITOT 4.3 (H) 05/09/2021   ALKPHOS 246 (H) 05/09/2021   AST 112 (H) 05/09/2021   ALT 43 05/09/2021   ANIONGAP 8 05/09/2021    CBG (last 3)  No results for input(s): GLUCAP in the last 72 hours.    Coagulation Profile: Recent Labs  Lab 05/03/21 0345  INR 1.7*     Radiology Studies: DG CHEST PORT 1 VIEW  Result Date: 05/08/2021 CLINICAL DATA:  Acute sepsis.  Pneumonia. EXAM: PORTABLE CHEST 1 VIEW COMPARISON:  05/03/2021 FINDINGS: Heart size remains within normal limits. Low lung volumes are again demonstrated. Atelectasis or infiltrate in the left lung base shows no significant change. Right lung remains clear. IMPRESSION: Low lung volumes, with no significant change in left basilar atelectasis versus infiltrate. Electronically Signed   By: Marlaine Hind M.D.   On: 05/08/2021 08:36       Hosie Poisson M.D. [facility] 05/09/2021, 4:59 PM  Available via Epic secure chat 7am-7pm After 7 pm, please refer to night coverage provider listed on amion.

## 2021-05-09 NOTE — Progress Notes (Addendum)
Daily Progress Note   Patient Name: Sharon Lawrence       Date: 05/09/2021 DOB: June 07, 1965  Age: 56 y.o. MRN#: [id number] Attending Physician: Hosie Poisson, MD Primary Care Physician: Lindell Spar, MD Admit Date: 04/22/2021  Reason for Consultation/Follow-up: Establishing goals of care  Subjective: Patient minimally interactive.  Patient wakes up briefly to tell me she is still feeling pain following administration of pain medication.  She tells me pain is in her abdomen but could not describe further than that.  Father at bedside.  Length of Stay: 17  Current Medications: Scheduled Meds:   feeding supplement  1 Container Oral TID BM   lactulose  30 g Oral TID   mouth rinse  15 mL Mouth Rinse BID   midodrine  10 mg Oral TID WC   multivitamin with minerals  1 tablet Oral Daily   nystatin   Topical TID   pantoprazole  40 mg Oral QHS   pregabalin  50 mg Oral BID   rifaximin  550 mg Oral BID   sertraline  25 mg Oral QPC breakfast    Continuous Infusions:  sodium chloride Stopped (04/23/21 0504)   sodium chloride 100 mL/hr at 05/09/21 0222   vancomycin 1,000 mg (05/09/21 1134)    PRN Meds: sodium chloride, acetaminophen, morphine CONCENTRATE, ondansetron, oxyCODONE, sodium chloride  Physical Exam Constitutional:      Comments: Lethargic  Pulmonary:     Effort: Pulmonary effort is normal.  Skin:    General: Skin is warm and dry.            Vital Signs: BP (!) 85/53 (BP Location: Right Arm)    Pulse 95    Temp 98.2 F (36.8 C) (Axillary)    Resp 15    Ht 5' (1.524 m)    Wt 68 kg    SpO2 97%    BMI 29.28 kg/m  SpO2: SpO2: 97 % O2 Device: O2 Device: Room Air O2 Flow Rate: O2 Flow Rate (L/min): 2 L/min  Intake/output summary:  Intake/Output Summary (Last 24 hours) at 05/09/2021 1331 Last  data filed at 05/08/2021 1544 Gross per 24 hour  Intake 2310.74 ml  Output 350 ml  Net 1960.74 ml   LBM: Last BM Date : 05/09/21 Baseline Weight: Weight: 82.3 kg Most recent weight: Weight: 68 kg       Palliative Assessment/Data: PPS 20%    Flowsheet Rows    Flowsheet Row Most Recent Value  Intake Tab   Referral Department Hospitalist  Unit at Time of Referral Intermediate Care Unit  Palliative Care Primary Diagnosis --  [Cirrhosis]  Date Notified 04/26/21  Palliative Care Type New Palliative care  Reason for referral Clarify Goals of Care  Date of Admission 04/22/21  Date first seen by Palliative Care 05/04/21  # of days Palliative referral response time 8 Day(s)  # of days IP prior to Palliative referral 4  Clinical Assessment   Psychosocial & Spiritual Assessment   Palliative Care Outcomes        Patient Active Problem List   Diagnosis Date Noted   Hepatic cirrhosis ([place]) 05/01/2021   Encephalopathy, hepatic    Mass of multiple sites of liver  Malnutrition of moderate degree 04/30/2021   Liver failure ([place]) 04/30/2021   Moderate episode of recurrent major depressive disorder (HCC)    Ascites    Pressure injury of skin 04/23/2021   Acute sepsis ([place]) 04/22/2021   Hypokalemia    Thrombocytopenia (HCC)    Depression, recurrent ([place]) 01/11/2021   Coronary artery disease involving native coronary artery of native heart without angina pectoris 01/11/2021   Abnormal drug screen (10/18/2020) 12/31/2020   Marijuana use 12/31/2020   Methadone misuse 12/31/2020   Pain medication agreement broken (10/18/2020) 12/31/2020   Hypoalbuminemia due to protein-calorie malnutrition ([place]) 12/12/2020   Non-pressure chronic ulcer of unspecified part of right lower leg with unspecified severity ([place]) 07/11/2020   Chronic venous hypertension (idiopathic) with ulcer and inflammation of right lower extremity (CODE) ([place]) 07/11/2020   Chronic use of opiate for therapeutic purpose  06/13/2020   Benign neoplasm of colon 02/14/2020   Internal derangement of right knee 02/14/2020   Moderate persistent asthma with (acute) exacerbation 02/14/2020   Spondylosis without myelopathy or radiculopathy, lumbosacral region 12/09/2019   Lumbar facet synovial cyst (L5-S1) (Left) 12/09/2019   Generalized anxiety disorder 10/06/2018   Thrombocytosis 09/30/2018   Anemia 09/23/2018   Polyarthralgia 09/09/2018   DDD (degenerative disc disease), lumbar 12/29/2017   DDD (degenerative disc disease), thoracic 12/29/2017   Lumbar facet arthropathy (Bilateral) 12/29/2017   Neurogenic pain 12/29/2017   Chronic low back pain (Primary Area of Pain) (Bilateral) (R>L) w/ sciatica (Right) 12/03/2017   Chronic pain syndrome 12/03/2017   Long term current use of opiate analgesic 12/03/2017   Subclinical hypothyroidism 11/06/2017   Gastroesophageal reflux disease 11/06/2017   Bilateral lower extremity edema 11/06/2017   Benign essential hypertension 11/06/2017   Panic disorder 11/06/2017   Right-sided low back pain with sciatica 11/06/2017   Migraine 11/06/2017   Iron deficiency anemia 11/06/2017   Morbidly obese ([place]) 09/25/2017   Chronic diarrhea, with Celiac disease 09/25/2017   Former smoker    Hyperlipidemia 09/17/2017   Family hx of colon cancer 09/11/2017   Celiac disease 09/11/2017   Influenza vaccination declined 01/01/2017   Vaginal condyloma 06/22/2013   Obesity 06/22/2013   Fatty liver disease, nonalcoholic 39/05/90    Palliative Care Assessment & Plan   HPI: 56 year old with prior history of coronary artery disease, STEMI in 2019, celiac disease, [place], GI bleed, chronic pain syndrome, liver cirrhosis who presented with generalized weakness, nausea, vomiting and abdominal pain, admitted with undifferentiated shock septic/hypovolemic. Patient came off of vasopressors, SBP was ruled out with negative peritoneal fluid studies. Urine cultures show yeast.    Palliative care has  been asked to get involved in the setting of noncompliance to further address goals of care.    Assessment: Follow-up today with patient and her father.  Patient tells me pain is uncontrolled by current regimen.  Reviewed clinical condition with father; father feels patient's status remains about the same.  Father tells me he is agreeable to plan to discharge home with hospice but he refers me to speak further to patient's [facility].  Spoke with [facility] via phone.  [name] tells me she recognizes that the patient is declining further.  [name] tells me patient barely woke up last night and this morning she was minimally interactive.  [name] tells me about her mother's death and how she appeared as she near the end of life.  [name] feels that Kamarri appears the same as her mother did before she passed away.  We discussed that pain is  currently not controlled with current regimen.  We discussed starting sublingual Roxanol.  I shared with [name] that the patient's blood pressure is soft and that this could cause worsening blood pressure however at this point in patient's care pain control is likely more important than monitoring vital signs.  [name] agrees with this and tells me she would like the medical team to focus on patient's comfort and elevate comfort over vital signs.  We discussed a transition home with hospice care.  [name] is agreeable to this.  [name] prefers a Educational psychologist care as this is the agency that cared for her mother.  We discussed philosophy of hospice care and [name] expresses understanding.  Holly request speaking to transitions of care team to further discuss a plan for transition home and insurance concerns.  Recommendations/Plan: Focus of care should be on comfort however family would like to continue antibiotics even if that means discharging home on by mouth antibiotics Family would like to discharge home with [facility]-I have let transitions of care team know, will need a hospital  bed Initiate sublingual Roxanol every hour as needed-I have discussed with family this could lead to lower blood pressures however they are agreeable to this as they would like to elevate her comfort Family understands patient is likely quickly nearing end-of-life Insert Foley for patient's comfort and per sister's request  Code Status: DNR  Discharge Planning: Home with Hospice  Care plan was discussed with patient's father, patient's sister, Dr. Karleen Hampshire, bedside nurse,Transitions of care team  Thank you for allowing the Palliative Medicine Team to assist in the care of this patient.  *Please note that this is a verbal dictation therefore any spelling or grammatical errors are due to the "[place] One" system interpretation.  Juel Burrow, DNP, [facility] Palliative Medicine Team Team Phone # [phone]  Pager [phone]

## 2021-05-10 ENCOUNTER — Other Ambulatory Visit (HOSPITAL_COMMUNITY): Payer: Self-pay

## 2021-05-10 ENCOUNTER — Telehealth: Payer: Self-pay

## 2021-05-10 ENCOUNTER — Encounter (HOSPITAL_COMMUNITY): Payer: Self-pay | Admitting: Hematology

## 2021-05-10 DIAGNOSIS — J189 Pneumonia, unspecified organism: Secondary | ICD-10-CM

## 2021-05-10 DIAGNOSIS — F331 Major depressive disorder, recurrent, moderate: Secondary | ICD-10-CM

## 2021-05-10 DIAGNOSIS — K769 Liver disease, unspecified: Secondary | ICD-10-CM

## 2021-05-10 DIAGNOSIS — R4182 Altered mental status, unspecified: Secondary | ICD-10-CM

## 2021-05-10 LAB — COMPREHENSIVE METABOLIC PANEL
ALT: 45 U/L — ABNORMAL HIGH (ref 0–44)
AST: 120 U/L — ABNORMAL HIGH (ref 15–41)
Albumin: 2 g/dL — ABNORMAL LOW (ref 3.5–5.0)
Alkaline Phosphatase: 260 U/L — ABNORMAL HIGH (ref 38–126)
Anion gap: 7 (ref 5–15)
BUN: 20 mg/dL (ref 6–20)
CO2: 15 mmol/L — ABNORMAL LOW (ref 22–32)
Calcium: 7.4 mg/dL — ABNORMAL LOW (ref 8.9–10.3)
Chloride: 103 mmol/L (ref 98–111)
Creatinine, Ser: 1.24 mg/dL — ABNORMAL HIGH (ref 0.44–1.00)
GFR, Estimated: 52 mL/min — ABNORMAL LOW (ref >60)
Glucose, Bld: 91 mg/dL (ref 70–99)
Potassium: 4.5 mmol/L (ref 3.5–5.1)
Sodium: 125 mmol/L — ABNORMAL LOW (ref 135–145)
Total Bilirubin: 4.8 mg/dL — ABNORMAL HIGH (ref 0.3–1.2)
Total Protein: 4.6 g/dL — ABNORMAL LOW (ref 6.5–8.1)

## 2021-05-10 LAB — PROCALCITONIN: Procalcitonin: 0.55 ng/mL

## 2021-05-10 MED ORDER — LACTULOSE ENCEPHALOPATHY 10 GM/15ML PO SOLN
30.0000 g | Freq: Three times a day (TID) | ORAL | 0 refills | Status: AC
  Filled 2021-05-10: qty 2365, 18d supply, fill #0

## 2021-05-10 MED ORDER — ONDANSETRON 8 MG PO TBDP
8.0000 mg | ORAL_TABLET | Freq: Three times a day (TID) | ORAL | 3 refills | Status: AC | PRN
Start: 2021-05-10 — End: ?
  Filled 2021-05-10: qty 20, 7d supply, fill #0

## 2021-05-10 MED ORDER — PREGABALIN 50 MG PO CAPS
50.0000 mg | ORAL_CAPSULE | Freq: Two times a day (BID) | ORAL | 0 refills | Status: AC
  Filled 2021-05-10: qty 30, 15d supply, fill #0

## 2021-05-10 MED ORDER — OXYCODONE HCL 5 MG PO TABS
5.0000 mg | ORAL_TABLET | Freq: Four times a day (QID) | ORAL | 0 refills | Status: AC | PRN
  Filled 2021-05-10: qty 28, 7d supply, fill #0

## 2021-05-10 MED ORDER — OMEPRAZOLE 40 MG PO CPDR
40.0000 mg | DELAYED_RELEASE_CAPSULE | Freq: Every day | ORAL | 3 refills | Status: AC
  Filled 2021-05-10: qty 30, 30d supply, fill #0

## 2021-05-10 MED ORDER — MORPHINE SULFATE (CONCENTRATE) 20 MG/ML PO SOLN
5.0000 mg | ORAL | 0 refills | Status: AC | PRN
  Filled 2021-05-10: qty 30, 5d supply, fill #0

## 2021-05-10 MED ORDER — RIFAXIMIN 550 MG PO TABS
550.0000 mg | ORAL_TABLET | Freq: Two times a day (BID) | ORAL | 0 refills | Status: AC
Start: 2021-05-10 — End: ?
  Filled 2021-05-10: qty 42, 21d supply, fill #0

## 2021-05-10 NOTE — Progress Notes (Signed)
PT Cancellation Note ? ?Patient Details ?Name: Sharon Lawrence ?MRN: [id number] ?DOB: [birth year] ? ? ?Cancelled Treatment:    Reason Eval/Treat Not Completed: Other (comment) (Pt discharging home with Hospice comfort care today.  Will sign off.) ? ? ?Alvira Philips ?05/10/2021, 8:48 AM ?Arrie Aran M,PT ?Acute Rehab Services ?[phone] ?[phone] (pager)  ?

## 2021-05-10 NOTE — Progress Notes (Signed)
DISCHARGE NOTE HOME ?Sharon Lawrence to be discharged Home on home hospice per MD order. Discussed prescriptions and follow up appointments with the patient and family. Prescriptions given to patient and family; medication list explained in detail. Patient family verbalized understanding. ? ?Skin clean, dry and intact without evidence of skin break down, no evidence of skin tears noted. IV catheter discontinued intact. Site without signs and symptoms of complications. Dressing and pressure applied. Pt denies pain at the site currently. No complaints noted. ? ?Patient has foley catheter in place.  ? ?An After Visit Summary (AVS) was printed and given to the patient. ?Patient escorted via stretcher, and discharged home via [facility]. ? ?Rushie Goltz, RN  ?

## 2021-05-10 NOTE — TOC Transition Note (Signed)
Transition of Care (TOC) - CM/SW Discharge Note ? ? ?Patient Details  ?Name: Sharon Lawrence ?MRN: [id number] ?Date of Birth: 1965-09-14 ? ?Transition of Care (TOC) CM/SW Contact:  ?Cyndi Bender, RN ?Phone Number: ?05/10/2021, 3:52 PM ? ? ?Clinical Narrative:    ?Patient going home with home hospice. DME will be delivered in around 1730. PTAR scheduled. PTAR is 4 hours behind. Shanita with [facility] notified. I have notified patient's nurse to call sister when PTAR arrives. Patient's father has her discharge medications.  ? ? ? ?Final next level of care: [place] ?Barriers to Discharge: Barriers Resolved ? ? ?Patient Goals and CMS Choice ?Patient states their goals for this hospitalization and ongoing recovery are:: go thome ?CMS Medicare.gov Compare Post Acute Care list provided to:: Patient ?Choice offered to / list presented to : Patient ? ?Discharge Placement ?  ?           ?  ? Home with sister ?  ?  ? ?Discharge Plan and Services ?  ?Discharge Planning Services: CM Consult ?Post Acute Care Choice: Home Health          ?DME Arranged: N/A ?DME Agency: NA ?  ?  ?  ?HH Arranged: Nurse's Aide ?[place] Agency:  ([facility]) ?Date HH Agency Contacted: 05/10/21 ?Time [place]: 1551 ?Representative spoke with at [place]: Fabio Pierce ? ?Social Determinants of Health (SDOH) Interventions ?  ? ? ?Readmission Risk Interventions ?No flowsheet data found. ? ? ? ? ?

## 2021-05-10 NOTE — Progress Notes (Signed)
HOSPITAL MEDICINE OVERNIGHT EVENT NOTE   ? ?Notified by nursing that patient is continuing to exhibit bouts of hypotension with last blood pressure being 87/51. ? ?Clinically, patient is exhibiting no change in overall presentation.  Nursing reports good urine output. ? ?Chart reviewed, patient has exhibited similar blood pressures throughout the day with blood pressures as low as 85/53 at 11:50 AM this morning.  Therefore patient's reported low blood pressures do not seem to be a change from prior. ? ?Patient continuing to receive broad-spectrum intravenous antibiotics as well as continuous infusion of fluids at 100 cc/hr.  We will maintain plan of care for now and continue to monitor closely. ? ?Vernelle Emerald  MD ?[facility]  ? ? ? ? ? ? ? ? ? ? ? ?

## 2021-05-10 NOTE — Progress Notes (Signed)
?                                                   ?Daily Progress Note  ? ?Patient Name: Sharon Lawrence       Date: 05/10/2021 ?DOB: [birth year]  Age: 56 y.o. MRN#: [id number] ?Attending Physician: Hosie Poisson, MD ?Primary Care Physician: Lindell Spar, MD ?Admit Date: 04/22/2021 ? ?Reason for Consultation/Follow-up: Establishing goals of care ? ?Subjective: ?Patient remains minimally interactive.  She tells me her pain is much more controlled today.  Father tells me after she received a dose of morphine yesterday she had comfort for the rest of the day.  Sharon Lawrence appears to have an irregular breathing pattern but she tells me she does not feel short of breath.  She denies any discomfort and tells me she is ready to go home.  Father and sister at bedside. ? ?  Length of Stay: 18 ? ?Current Medications: ?Scheduled Meds:  ? Chlorhexidine Gluconate Cloth  6 each Topical Daily  ? feeding supplement  1 Container Oral TID BM  ? lactulose  30 g Oral TID  ? mouth rinse  15 mL Mouth Rinse BID  ? midodrine  10 mg Oral TID WC  ? multivitamin with minerals  1 tablet Oral Daily  ? nystatin   Topical TID  ? pantoprazole  40 mg Oral QHS  ? pregabalin  50 mg Oral BID  ? rifaximin  550 mg Oral BID  ? sertraline  25 mg Oral QPC breakfast  ? ? ?Continuous Infusions: ? sodium chloride Stopped (04/23/21 0504)  ? sodium chloride 100 mL/hr at 05/10/21 1003  ? vancomycin 1,000 mg (05/09/21 1134)  ? ? ?PRN Meds: ?sodium chloride, acetaminophen, morphine CONCENTRATE, ondansetron, oxyCODONE, sodium chloride ? ?Physical Exam ?Constitutional:   ?   Comments: Lethargic  ?Pulmonary:  ?   Comments: Irregular ?Abdominal:  ?   General: There is distension.  ?Skin: ?   General: Skin is warm and dry.  ?         ? ?Vital Signs: BP (!) 86/46 (BP Location: Right Arm)   Pulse (!) 102   Temp 99.6 ?F (37.6 ?C) (Axillary)    Resp 10   Ht 5' (1.524 m)   Wt 68 kg   SpO2 95%   BMI 29.28 kg/m?  ?SpO2: SpO2: 95 % ?O2 Device: O2 Device: Room Air ?O2 Flow Rate: O2 Flow Rate (L/min): 2 L/min ? ?Intake/output summary:  ?Intake/Output Summary (Last 24 hours) at 05/10/2021 1054 ?Last data filed at 05/10/2021 9211 ?Gross per 24 hour  ?Intake 1300 ml  ?Output 600 ml  ?Net 700 ml  ? ? ?LBM: Last BM Date : 05/09/21 ?Baseline Weight: Weight: 82.3 kg ?Most recent weight: Weight: 68 kg ? ?     ?Palliative Assessment/Data: PPS 20% ? ? ? ?Flowsheet Rows   ? ?Flowsheet Row Most Recent Value  ?Intake Tab   ?Referral Department Hospitalist  ?Unit at Time of Referral Intermediate Care Unit  ?Palliative Care Primary Diagnosis --  [Cirrhosis]  ?Date Notified 04/26/21  ?Palliative Care Type New Palliative care  ?Reason for referral Clarify Goals of Care  ?Date of Admission 04/22/21  ?Date first seen by Palliative Care 05/04/21  ?# of days Palliative referral response time 8 Day(s)  ?# of days IP prior to Palliative referral 4  ?  Clinical Assessment   ?Psychosocial & Spiritual Assessment   ?Palliative Care Outcomes   ? ?  ? ? ?Patient Active Problem List  ? Diagnosis Date Noted  ? Hepatic cirrhosis ([place]) 05/01/2021  ? Encephalopathy, hepatic   ? Mass of multiple sites of liver   ? Malnutrition of moderate degree 04/30/2021  ? Liver failure ([place]) 04/30/2021  ? Moderate episode of recurrent major depressive disorder ([place])   ? Ascites   ? Pressure injury of skin 04/23/2021  ? Acute sepsis ([place]) 04/22/2021  ? Hypokalemia   ? Thrombocytopenia ([place])   ? Depression, recurrent ([place]) 01/11/2021  ? Coronary artery disease involving native coronary artery of native heart without angina pectoris 01/11/2021  ? Abnormal drug screen (10/18/2020) 12/31/2020  ? Marijuana use 12/31/2020  ? Methadone misuse 12/31/2020  ? Pain medication agreement broken (10/18/2020) 12/31/2020  ? Hypoalbuminemia due to protein-calorie malnutrition ([place]) 12/12/2020  ? Non-pressure chronic ulcer of  unspecified part of right lower leg with unspecified severity ([place]) 07/11/2020  ? Chronic venous hypertension (idiopathic) with ulcer and inflammation of right lower extremity (CODE) ([place]) 07/11/2020  ? Chronic use of opiate for therapeutic purpose 06/13/2020  ? Benign neoplasm of colon 02/14/2020  ? Internal derangement of right knee 02/14/2020  ? Moderate persistent asthma with (acute) exacerbation 02/14/2020  ? Spondylosis without myelopathy or radiculopathy, lumbosacral region 12/09/2019  ? Lumbar facet synovial cyst (L5-S1) (Left) 12/09/2019  ? Generalized anxiety disorder 10/06/2018  ? Thrombocytosis 09/30/2018  ? Anemia 09/23/2018  ? Polyarthralgia 09/09/2018  ? DDD (degenerative disc disease), lumbar 12/29/2017  ? DDD (degenerative disc disease), thoracic 12/29/2017  ? Lumbar facet arthropathy (Bilateral) 12/29/2017  ? Neurogenic pain 12/29/2017  ? Chronic low back pain (Primary Area of Pain) (Bilateral) (R>L) w/ sciatica (Right) 12/03/2017  ? Chronic pain syndrome 12/03/2017  ? Long term current use of opiate analgesic 12/03/2017  ? Subclinical hypothyroidism 11/06/2017  ? Gastroesophageal reflux disease 11/06/2017  ? Bilateral lower extremity edema 11/06/2017  ? Benign essential hypertension 11/06/2017  ? Panic disorder 11/06/2017  ? Right-sided low back pain with sciatica 11/06/2017  ? Migraine 11/06/2017  ? Iron deficiency anemia 11/06/2017  ? Morbidly obese ([place]) 09/25/2017  ? Chronic diarrhea, with Celiac disease 09/25/2017  ? Former smoker   ? Hyperlipidemia 09/17/2017  ? Family hx of colon cancer 09/11/2017  ? Celiac disease 09/11/2017  ? Influenza vaccination declined 01/01/2017  ? Vaginal condyloma 06/22/2013  ? Obesity 06/22/2013  ? Fatty liver disease, nonalcoholic 24/40/1027  ? ? ?Palliative Care Assessment & Plan  ? ?HPI: ?56 year old with prior history of coronary artery disease, STEMI in 2019, celiac disease, [name], GI bleed, chronic pain syndrome, liver cirrhosis who presented with  generalized weakness, nausea, vomiting and abdominal pain, admitted with undifferentiated shock septic/hypovolemic. Patient came off of vasopressors, SBP was ruled out with negative peritoneal fluid studies. Urine cultures show yeast.  ?  ?Palliative care has been asked to get involved in the setting of noncompliance to further address goals of care. ?  ? ?Assessment: ?Follow-up today with patient.  Sister and father at bedside.  All confirm a desire to go home today with the support of hospice.  We discussed symptom management.  We discussed continued use of sublingual Roxanol for management of both pain and shortness of breath.  We discussed likeliness that as disease progresses she will have increased need for meds.  Dr. Karleen Hampshire also at bedside discussing discharge plans with family. ? ?Recommendations/Plan: ?Focus of care should be on comfort  however family would like to continue antibiotics even if that means discharging home on by mouth antibiotics ?Family would like to discharge [facility] with [facility]-I have let transitions of care team know, will need a hospital bed ?Continue sublingual Roxanol and discharged home with as well-I have discussed with family this could lead to lower blood pressures however they are agreeable to this as they would like to elevate her comfort ?Family understands patient is likely quickly nearing end-of-life ?Discharged with Foley for patient's comfort and per sister's request ?Planning for discharge today ? ?Code Status: ?DNR ? ?Discharge Planning: ?Home with Hospice ? ?Care plan was discussed with patient's father, patient's sister, Dr. Karleen Hampshire, ? ?Thank you for allowing the Palliative Medicine Team to assist in the care of this patient. ? ?*Please note that this is a verbal dictation therefore any spelling or grammatical errors are due to the "[place] [employer]" system interpretation. ? ?Juel Burrow, DNP, AGNP-C ?Palliative Medicine Team ?Team Phone # [phone]   ?Pager [phone] ? ?

## 2021-05-10 NOTE — Telephone Encounter (Signed)
Being discharged for Hospital today to Hospice svc in the home - Will Dr Posey Pronto serve as attending - Please call and let them know  ?

## 2021-05-10 NOTE — Progress Notes (Signed)
?   05/09/21 2000  ?Assess: MEWS Score  ?Temp 98.6 ?F (37 ?C)  ?BP 102/71  ?Pulse Rate 100  ?ECG Heart Rate (!) 103  ?Resp (!) 7  ?Level of Consciousness Alert  ?SpO2 96 %  ?Assess: MEWS Score  ?MEWS Temp 0  ?MEWS Systolic 0  ?MEWS Pulse 1  ?MEWS RR 2  ?MEWS LOC 0  ?MEWS Score 3  ?MEWS Score Color Yellow  ?Assess: if the MEWS score is Yellow or Red  ?Were vital signs taken at a resting state? Yes  ?Focused Assessment No change from prior assessment  ?Early Detection of Sepsis Score *See Row Information* Low  ?MEWS guidelines implemented *See Row Information* Yes  ?Treat  ?Pain Scale PAINAD  ?Pain Score 0  ?Take Vital Signs  ?Increase Vital Sign Frequency  Yellow: Q 2hr X 2 then Q 4hr X 2, if remains yellow, continue Q 4hrs  ?Escalate  ?MEWS: Escalate Yellow: discuss with charge nurse/RN and consider discussing with provider and RRT  ?Notify: Charge Nurse/RN  ?Name of Charge Nurse/RN Notified Gerald Stabs RN  ?Date Charge Nurse/RN Notified 05/09/21  ?Time Charge Nurse/RN Notified 2000  ?Document  ?Patient Outcome Other (Comment) ?(pt previously yellow and VS are known to be at previous range)  ?Progress note created (see row info) Yes  ? ? ?

## 2021-05-10 NOTE — Plan of Care (Signed)
Pt is lethargic most of the time but awakens briefly to voice. Pt takes meds whole 1 at a time with encouragement and need to go slowly. Pt is turned by staff or family every 2 hours. Family stays around the clock with pt. Pt hasn't received any pain meds tonight. SBP below 88 most of the night with MAPs in low 60's. Doctor notified once because of a Red MEWS, no new orders r/t pt's condition. Pt has a foley for comfort care. Pt to be dischg to sister Holly's house on [address] possibly today. Pt eats and drinks very little. "Guppy Breathing" resp most of the time.  ?

## 2021-05-10 NOTE — Progress Notes (Signed)
Physical Therapy Discharge ?Patient Details ?Name: Sharon Lawrence ?MRN: [id number] ?DOB: 1966/02/12 ?Today's Date: 05/10/2021 ?Time:  -  ?  ? ?Patient discharged from PT services secondary to medical decline - will need to re-order PT to resume therapy services. ? ?Please see latest therapy progress note for current level of functioning and progress toward goals.   ? ?Progress and discharge plan discussed with patient and/or caregiver: Patient/Caregiver agrees with plan ? ?GP ?   ? ?Alvira Philips ?05/10/2021, 8:48 AM  ?Arrie Aran M,PT ?Acute Rehab Services ?[phone] ?[phone] (pager)  ?

## 2021-05-10 NOTE — Progress Notes (Signed)
Occupational Therapy Treatment ?Patient Details ?Name: Sharon Lawrence ?MRN: [id number] ?DOB: [birth year] ?Today's Date: 05/10/2021 ? ? ?History of present illness Pt presented to [facility] for nausea and vomiting on 2/19. Pt with septic shock and UTI. PMH - STEMI, ADHD, anxiety, chronic pain, CAD, Celiac disease, encephalopthy ?  ?OT comments ? Pt in bed resting with her father present for session.  Provided red foam grips for trial over utensils to help with self feeing as well as providing written handout on AAROM exercises for BUEs which were performed last visit with pt's son.  Completed brief trial of foam grip with max hand over hand assistance needed using the RUE for simulated feeding.  Also provided visual education on exercises as well to be completed at home if pt so desires.  Pt lethargic but able to maintain eyes opened during session with increased dysarthric speech noted when trying to reply to therapist and her father.  Pt will be discharged from OT services at this time secondary to being comfort care and leaving later today with home hospice services.    ? ?Recommendations for follow up therapy are one component of a multi-disciplinary discharge planning process, led by the attending physician.  Recommendations may be updated based on patient status, additional functional criteria and insurance authorization. ?   ?Follow Up Recommendations ? No OT follow up  ?  ?Assistance Recommended at Discharge Frequent or constant Supervision/Assistance  ?Patient can return home with the following ? Two people to help with bathing/dressing/bathroom;Direct supervision/assist for financial management;Assistance with cooking/housework;Assistance with feeding;Direct supervision/assist for medications management ?  ?Equipment Recommendations ? Hospital bed;Wheelchair (measurements OT);Wheelchair cushion (measurements OT);Other (comment)  ?  ?Recommendations for Other Services   ? ?  ?Precautions / Restrictions  Precautions ?Precautions: Fall  ? ? ?  ? ?   ?   ? ?ADL either performed or assessed with clinical judgement  ? ?ADL   ?Eating/Feeding: With adaptive utensils;Total assistance ?  ?  ?  ?  ?  ?  ?  ?  ?  ?  ?  ?  ?  ?  ?  ?  ?  ?  ?General ADL Comments: Provided foam grips to go over utensils for pt to trial at home with noted discharge with hospice and comfort care.  Also provided with handout and demonstration of AAROM exercises for BUEs with her father.  Instructed them on completing exercises if pt requests in order to help with increasing strength for grooming tasks and self feeding.  Also provided verbal instructions on assisting pt in the bed with rolling if needed during toileting incontinence or dressing tasks.  Did not attempt secondary to not wanting to make pt uncomfortable at this time. ?  ? ?   ?   ?   ? ?Cognition Arousal/Alertness: Lethargic ?Behavior During Therapy: Flat affect ?Overall Cognitive Status: Impaired/Different from baseline ?Area of Impairment: Awareness, Attention, Following commands ?  ?  ?  ?  ?  ?  ?  ?  ?  ?Current Attention Level: Focused, Sustained ?  ?Following Commands:  (Pt inconsistent with following commands this session secondary to lethargy and decreased focused attention.) ?Safety/Judgement: Decreased awareness of deficits ?  ?  ?General Comments: Pt with eyes open in bed, inconsistent following of commands and dysarthric speech secondary to limited attention level. ?  ?  ?   ?   ?   ?   ? ? ?Pertinent Vitals/ Pain  Pain Assessment ?Pain Assessment: Faces ?Faces Pain Scale: Hurts a little bit ?Pain Location: generalized ?Pain Descriptors / Indicators: Discomfort ?Pain Intervention(s): Limited activity within patient's tolerance, Monitored during session ? ?   ?   ? ?Frequency ? Min 2X/week  ? ? ? ? ?  ?Progress Toward Goals ? ?OT Goals(current goals can now be found in the care plan section) ? Progress towards OT goals: Not progressing toward goals - comment (goals  discharged as pt is comfort care now) ? ?Acute Rehab OT Goals ?Potential to Achieve Goals: Poor  ?Plan Discharge plan needs to be updated   ? ?   ?AM-PAC OT "6 Clicks" Daily Activity     ?Outcome Measure ? ? Help from another person eating meals?: Total ?Help from another person taking care of personal grooming?: A Lot ?Help from another person toileting, which includes using toliet, bedpan, or urinal?: Total ?Help from another person bathing (including washing, rinsing, drying)?: Total ?Help from another person to put on and taking off regular upper body clothing?: Total ?Help from another person to put on and taking off regular lower body clothing?: Total ?6 Click Score: 7 ? ?  ?End of Session   ? ?OT Visit Diagnosis: Unsteadiness on feet (R26.81);Muscle weakness (generalized) (M62.81);Feeding difficulties (R63.3);Ataxia, unspecified (R27.0) ?  ?Activity Tolerance Patient limited by fatigue;Patient limited by lethargy ?  ?Patient Left in bed;with call bell/phone within reach;with family/visitor present;with bed alarm set ?  ?Nurse Communication   ?  ? ?   ? ?Time: 7680-8811 ?OT Time Calculation (min): 12 min ? ?Charges: OT General Charges ?$OT Visit: 1 Visit ?OT Treatments ?$Self Care/Home Management : 8-22 mins ? ?Katina Remick OTR/L ?05/10/2021, 11:45 AM ?

## 2021-05-10 NOTE — Consult Note (Signed)
Brief Psychiatry Consult Note  ?The patient was last seen by the psychiatry service on 3/6. Interim documentation by primary team and nursing staff has been reviewed. At this time, patient is discharging home with hospice and there is no evidence of acute psychiatric disturbance requiring ongoing psychiatric consultation. Please see last consult note for full assessment. Final medication recommendations are as follows: ? ?- c sertraline 25 mg ? ?We will sign off at this time. This has been communicated to the primary team. If issues arise in the future, don't hesitate to reconsult the Psychiatry Inpatient Consult Service.  ? ?Joycelyn Schmid A Leidi Astle ? ?

## 2021-05-10 NOTE — Telephone Encounter (Signed)
Verbal order given this was ok for patel to be attending  ?

## 2021-05-10 NOTE — Plan of Care (Signed)
?  Problem: Health Behavior/Discharge Planning: ?Goal: Ability to manage health-related needs will improve ?Outcome: Adequate for Discharge ?Will be discharged today for hospice care ?  ?

## 2021-05-10 NOTE — Progress Notes (Signed)
Patient vitals known by provider. She is on modified comfort measures. Code Status: DNR. Plan is for patient to discharge with home hospice to sister's house. [place] MD aware. No new orders at this time.  ? ? 05/10/21 0000  ?Assess: MEWS Score  ?BP (!) 90/49  ?ECG Heart Rate (!) 109  ?Resp (!) 7  ?Assess: MEWS Score  ?MEWS Temp 0  ?MEWS Systolic 1  ?MEWS Pulse 1  ?MEWS RR 2  ?MEWS LOC 0  ?MEWS Score 4  ?MEWS Score Color Red  ?Assess: if the MEWS score is Yellow or Red  ?Were vital signs taken at a resting state? Yes  ?Focused Assessment No change from prior assessment  ?Early Detection of Sepsis Score *See Row Information* Low  ?MEWS guidelines implemented *See Row Information* Yes  ?Treat  ?Pain Scale 0-10  ?Take Vital Signs  ?Increase Vital Sign Frequency  Red: Q 1hr X 4 then Q 4hr X 4, if remains red, continue Q 4hrs  ?Escalate  ?MEWS: Escalate Red: discuss with charge nurse/RN and provider, consider discussing with RRT  ?Notify: Charge Nurse/RN  ?Name of Charge Nurse/RN Notified Genella Mech RN  ?Date Charge Nurse/RN Notified 05/10/21  ?Time Charge Nurse/RN Notified 0000  ?Document  ?Patient Outcome Other (Comment) ?(No escalation of care-patient modified comfort measures)  ?Progress note created (see row info) Yes  ? ? ?

## 2021-05-10 NOTE — Discharge Summary (Signed)
Physician Discharge Summary   Patient: Sharon Lawrence MRN: [id number] DOB: 03/23/1965  Admit date:     04/22/2021  Discharge date: 05/10/21  Discharge Physician: Hosie Poisson   PCP: Lindell Spar, MD   Recommendations at discharge:  Please follow up with hospice MD as recommended.   Discharge Diagnoses: Principal Problem:   Acute sepsis ([place]) Active Problems:   Benign essential hypertension   Hypokalemia   Thrombocytopenia (HCC)   Pressure injury of skin   Moderate episode of recurrent major depressive disorder (HCC)   Malnutrition of moderate degree   Liver failure (HCC)   Ascites   Hepatic cirrhosis (HCC)   Encephalopathy, hepatic   Mass of multiple sites of liver    Hospital Course: 56 year old with history of coronary artery disease, STEMI in 2019, celiac disease, [name] cirrhosis and GI bleeding, chronic pain syndrome presented with generalized weakness, nausea vomiting and abdominal pain.  She was admitted to intensive care unit with initial low blood pressures on vasopressors.  SBP was ruled out with negative peritoneal fluid studies.  Urine culture showed yeast.  Underwent MRI of the liver that showed cirrhosis, hematomas and moderate ascites with anasarca.  Patient had extensive weakness, failure to thrive, decreased oral intake and encephalopathy from hyperammonemia.  She had episodes of hypotension responding to IV fluids.  Remains in the hospital, very debilitated.  Liver biopsy is pending.   3/7 ,with initial improvement of hemodynamics patient again started decompensating, low blood pressures and tachycardia.  Poor prognosis. Palliative care consulted and plan for discharge home with hospice once hospital bed is delivered.     Assessment and Plan: Severe sepsis with hypotension Was on broad spectrum IV antibiotics. Will d/c the antibiotics as no source evident.      Decompensated cirrhosis with ascites in the setting of NASH/hepatic and metabolic encephalopathy So  far work-up for autoimmune hepatitis and viral hepatitis is negative. Patient continues to have progressive cirrhosis with portal hypertension. MRI liver consistent with hepatic lesions, core liver biopsy done consistent with cirrhosis and steato hepatitis.      Anemia of acute illness and acute thrombocytopenia:  - sec to liver cirrhosis .  S/p 2 units prbc Transitioned to hospice care.  Marland Kitchen   RN Pressure Injury Documentation: Pressure Injury 04/22/21 Sacrum Right;Mid Stage 2 -  Partial thickness loss of dermis presenting as a shallow open injury with a red, pink wound bed without slough. Pink wound bed (Active)  04/22/21 2145  Location: Sacrum  Location Orientation: Right;Mid  Staging: Stage 2 -  Partial thickness loss of dermis presenting as a shallow open injury with a red, pink wound bed without slough.  Wound Description (Comments): Pink wound bed  Present on Admission: Yes      Goals of care.  Failure to thrive/moderate malnutrition/advanced liver disease: With multiple issues and advanced liver disease, patient has poor prognosis and outcome. She has been bedbound with poor mobility and extreme debility. Seen by gastroenterology, prognosis is poor.  Recommended home with home hospice  Detailed discussion that patient may not survive this episode of decompensation.   Palliative meeting with family and Sharon Lawrence  and plan for home with hospice.          Pain control - [employer] Controlled Substance Reporting System database was reviewed. and patient was instructed, not to drive, operate heavy machinery, perform activities at heights, swimming or participation in water activities or provide baby-sitting services while on Pain, Sleep and Anxiety Medications; until their outpatient  Physician has advised to do so again. Also recommended to not to take more than prescribed Pain, Sleep and Anxiety Medications.  Consultants: gi  Palliative care  Procedures performed:   Disposition:  Hospice care Diet recommendation:  Discharge Diet Orders (From admission, onward)     Start     Ordered   05/10/21 0000  Diet - low sodium heart healthy        05/10/21 1039           Regular diet DISCHARGE MEDICATION: Allergies as of 05/10/2021       Reactions   Bee Venom Anaphylaxis   Erythromycin Anaphylaxis   Cephalexin    Neomycin-bacitracin-polymyxin  [bacitracin-neomycin-polymyxin] Swelling   Sight of application   Neosporin Original [bacitracin-neomycin-polymyxin] Swelling   Swelling at site of application   Prednisone    Sulfa Antibiotics Swelling   Sulfa eye drops caused eyes to swell Sulfa eye drops caused eyes to swell   Tobrex [tobramycin] Swelling   Tramadol Hives   Penicillins Rash   Has patient had a PCN reaction causing immediate rash, facial/tongue/throat swelling, SOB or lightheadedness with hypotension: Yes Has patient had a PCN reaction causing severe rash involving mucus membranes or skin necrosis: No Has patient had a PCN reaction that required hospitalization: No Has patient had a PCN reaction occurring within the last 10 years: No If all of the above answers are "NO", then may proceed with Cephalosporin use.        Medication List     STOP taking these medications    aspirin 81 MG chewable tablet   levonorgestrel 20 MCG/24HR IUD Commonly known as: MIRENA   loperamide 2 MG capsule Commonly known as: IMODIUM   metoprolol succinate 25 MG 24 hr tablet Commonly known as: Toprol XL   rizatriptan 10 MG tablet Commonly known as: MAXALT   tiZANidine 4 MG tablet Commonly known as: Zanaflex   torsemide 20 MG tablet Commonly known as: DEMADEX       TAKE these medications    CertaVite/Antioxidants Tabs Take 1 tablet by mouth daily.   famotidine 40 MG tablet Commonly known as: PEPCID Take 1 tablet (40 mg total) by mouth at bedtime.   ketoconazole 2 % cream Commonly known as: NIZORAL Apply topically 2 (two) times daily. What  changed:  when to take this reasons to take this   lactulose (encephalopathy) 10 GM/15ML Soln Commonly known as: CHRONULAC Take 45 mLs (30 g total) by mouth 2 (two) times daily.   lactulose 10 GM/15ML solution Commonly known as: CHRONULAC Take 45 mLs (30 g total) by mouth 3 (three) times daily.   metoCLOPramide 10 MG tablet Commonly known as: REGLAN Take 1 tablet (10 mg total) by mouth every 8 (eight) hours as needed for nausea.   midodrine 5 MG tablet Commonly known as: PROAMATINE Take 3 tablets (15 mg total) by mouth 3 (three) times daily with meals.   morphine CONCENTRATE 10 MG/0.5ML Soln concentrated solution Take 0.25 mLs (5 mg total) by mouth every hour as needed for up to 7 days for severe pain or shortness of breath.   nitroGLYCERIN 0.4 MG/SPRAY spray Commonly known as: NITROLINGUAL Place 2 sprays under the tongue every 5 (five) minutes x 3 doses as needed.   nystatin powder Commonly known as: MYCOSTATIN/NYSTOP Apply topically 3 (three) times daily as needed (yeast infection).   omeprazole 40 MG capsule Commonly known as: PRILOSEC Take 1 capsule (40 mg total) by mouth daily.   ondansetron 8 MG disintegrating  tablet Commonly known as: ZOFRAN-ODT Take 1 tablet (8 mg total) by mouth 3 (three) times daily as needed for nausea.   oxyCODONE 5 MG immediate release tablet Commonly known as: Oxy IR/ROXICODONE Take 1 tablet (5 mg total) by mouth every 6 (six) hours as needed for up to 7 days for severe pain, moderate pain or breakthrough pain.   potassium chloride 10 MEQ tablet Commonly known as: KLOR-CON Take 10 meq daily when you take Torsemide   pregabalin 50 MG capsule Commonly known as: LYRICA Take 1 capsule (50 mg total) by mouth 2 (two) times daily. What changed:  medication strength how much to take   prochlorperazine 10 MG tablet Commonly known as: COMPAZINE TAKE (1) TABLET BY MOUTH EVERY SIX HOURS AS NEEDED NAUSEA OR VOMITING. What changed: See the new  instructions.   rifaximin 550 MG Tabs tablet Commonly known as: XIFAXAN Take 1 tablet (550 mg total) by mouth 2 (two) times daily.   sertraline 25 MG tablet Commonly known as: ZOLOFT Take 1 tablet (25 mg total) by mouth daily after breakfast.   Vitamin D (Ergocalciferol) 1.25 MG (50000 UNIT) Caps capsule Commonly known as: DRISDOL Take 50,000 Units by mouth every 7 (seven) days. Wednesday               Durable Medical Equipment  (From admission, onward)           Start     Ordered   05/01/21 1350  For home use only DME Hospital bed  Once       Question Answer Comment  Length of Need Lifetime   The above medical condition requires: Patient requires the ability to reposition frequently   Head must be elevated greater than: 45 degrees   Bed type Semi-electric   Hoyer Lift Yes      05/01/21 1350              Discharge Care Instructions  (From admission, onward)           Start     Ordered   05/10/21 0000  Discharge wound care:       Comments: Local wound care   05/10/21 1039            Discharge Exam: Filed Weights   04/22/21 1410 05/07/21 1100  Weight: 82.3 kg 68 kg   General exam: ill appearing , jaundiced. Lethargic.  Respiratory system:diminished at bases.  Cardiovascular system: S1 & S2 heard, tachycardic, anasarca.  Gastrointestinal system: Abdomen is mildly distended  , soft, non tender.  Central nervous system: somnolent, able to say a few words.  Extremities: leg edema present.  Skin: No rashes,  Psychiatry: Mood & affect appropriate.    Condition at discharge: poor  The results of significant diagnostics from this hospitalization (including imaging, microbiology, ancillary and laboratory) are listed below for reference.   Imaging Studies: CT HEAD WO CONTRAST (5MM)  Result Date: 05/01/2021 CLINICAL DATA:  Altered mental status. EXAM: CT HEAD WITHOUT CONTRAST TECHNIQUE: Contiguous axial images were obtained from the base of  the skull through the vertex without intravenous contrast. RADIATION DOSE REDUCTION: This exam was performed according to the departmental dose-optimization program which includes automated exposure control, adjustment of the mA and/or kV according to patient size and/or use of iterative reconstruction technique. COMPARISON:  None. FINDINGS: Brain: Mild diffuse cortical atrophy is noted. No evidence of acute infarction, hemorrhage, hydrocephalus, extra-axial collection or mass lesion/mass effect. Vascular: No hyperdense vessel or unexpected calcification. Skull: Normal. Negative  for fracture or focal lesion. Sinuses/Orbits: No acute finding. Other: None. IMPRESSION: Mild diffuse cortical atrophy. No acute intracranial abnormality seen. Electronically Signed   By: Marijo Conception M.D.   On: 05/01/2021 15:59   MR LIVER W WO CONTRAST  Result Date: 04/25/2021 CLINICAL DATA:  Cirrhosis with liver lesions on prior CT scan. EXAM: MRI ABDOMEN WITHOUT AND WITH CONTRAST TECHNIQUE: Multiplanar multisequence MR imaging of the abdomen was performed both before and after the administration of intravenous contrast. CONTRAST:  8.62m GADAVIST GADOBUTROL 1 MMOL/ML IV SOLN COMPARISON:  CT scan 12/12/2020 FINDINGS: Examination is limited by body habitus and breathing motion artifact. Lower chest: The lung bases are clear of an acute process. No pleural or pericardial effusion. Hepatobiliary: Heterogeneous fatty infiltration of the liver is demonstrated. There are also cirrhotic changes which appear stable. No intrahepatic biliary dilatation. Gallbladder is surgically absent. On the early arterial phase enhancement sequence there are enhancing hepatic lesions. These are ill-defined and difficult to accurately characterize. 16 mm cortical or subcortical lesion and segment 4 B is faintly present on the portal venous phase sequence but I do not see it definitely after that. 11 mm lesion and segment 3 anteriorly is persistent on delayed  sequences without definite washout. This also demonstrates slight increased T2 signal intensity and potentially could be a may angioma. 2 cm lesion and segment 3 laterally is best seen on the delayed sequence with there appears to be subcapsular enhancement. This is the most worrisome for a hepatoma but is indeterminate. Nonenhancing lesion and segment 6 is likely a cyst. Pancreas:  No mass, inflammation or ductal dilatation. Spleen:  Within normal limits in size.  No splenic lesions. Adrenals/Urinary Tract: The adrenal glands demonstrate small bilateral nodules which show loss of signal intensity on the out of phase gradient echo sequence and are likely benign adenomas. No change since 2019. No renal lesions or hydronephrosis. Stomach/Bowel: The stomach, duodenum, small bowel and colon grossly normal. Vascular/Lymphatic: The aorta and branch vessels are patent. The major venous structures are patent. No adenopathy. Other: Moderate volume abdominal ascites and diffuse body wall edema. Musculoskeletal: No significant bony findings. IMPRESSION: 1. Limited examination due to body habitus and breathing motion artifact. 2. Heterogeneous fatty infiltration of the liver and cirrhotic changes. 3. Hepatic lesions are difficult to accurately characterize and to definitively use LI-RADS criteria but suspicious for hepatomas. Recommend close observation. 4. Bilateral adrenal gland adenomas. 5. Moderate volume abdominal ascites and diffuse body wall edema. Electronically Signed   By: PMarijo SanesM.D.   On: 04/25/2021 19:32   [place]Abdomen Limited  Result Date: 04/29/2021 CLINICAL DATA:  ASCITES EXAM: LIMITED ABDOMEN ULTRASOUND FOR ASCITES TECHNIQUE: Limited ultrasound survey for ascites was performed in all four abdominal quadrants. COMPARISON:  None. FINDINGS: Sonographic evaluation of the 4 quadrants of the abdomen demonstrates only small volume ascites with multiple loops of mildly dilated and fluid-filled small bowel.  There was no safe window to allow for paracentesis. IMPRESSION: Small volume ascites, insufficient for paracentesis. Several loops of fluid-filled small bowel visualized. Recommend clinical correlation for evidence of small-bowel obstruction or ileus. Electronically Signed   By: HJacqulynn CadetM.D.   On: 04/29/2021 10:34   [place]BIOPSY (LIVER)  Result Date: 05/03/2021 INDICATION: 56year old woman with history of cirrhosis presents to IR for ultrasound-guided random liver biopsy. EXAM: Ultrasound-guided random liver biopsy MEDICATIONS: None. ANESTHESIA/SEDATION: None COMPLICATIONS: None immediate. PROCEDURE: Informed written consent was obtained from the patient after a thorough discussion of the procedural risks,  benefits and alternatives. All questions were addressed. Maximal Sterile Barrier Technique was utilized including caps, mask, sterile gowns, sterile gloves, sterile drape, hand hygiene and skin antiseptic. A timeout was performed prior to the initiation of the procedure. Patient position supine on the ultrasound table. Epigastric skin prepped and draped in usual sterile fashion. Following local lidocaine administration, 17 gauge introducer needle was advanced into the left liver lobe, and three 18 gauge cores were obtained utilizing continuous ultrasound guidance. Gelfoam slurry was administered through the introducer needle at the biopsy site. Samples were sent to pathology in formalin. Needle removed and hemostasis achieved with 5 minutes of manual compression. Post procedure ultrasound images showed no evidence of significant hemorrhage. IMPRESSION: Ultrasound-guided random biopsy of the left liver lobe. Electronically Signed   By: Miachel Roux M.D.   On: 05/03/2021 11:43   US BIOPSY (LIVER)  Result Date: 05/03/2021 INDICATION: 56 year old woman with history of cirrhosis and liver lesions presents to IR for left liver mass biopsy EXAM: Ultrasound-guided biopsy of left liver lesion MEDICATIONS:  None. ANESTHESIA/SEDATION: Moderate (conscious) sedation was employed during this procedure. A total of Versed 0.5 mg and Fentanyl 25 mcg was administered intravenously by the radiology nurse. Total intra-service moderate Sedation Time: 20 minutes. The patient's level of consciousness and vital signs were monitored continuously by radiology nursing throughout the procedure under my direct supervision. COMPLICATIONS: None immediate. PROCEDURE: Informed written consent was obtained from the patient after a thorough discussion of the procedural risks, benefits and alternatives. All questions were addressed. Maximal Sterile Barrier Technique was utilized including caps, mask, sterile gowns, sterile gloves, sterile drape, hand hygiene and skin antiseptic. A timeout was performed prior to the initiation of the procedure. Patient position supine on the ultrasound table. Epigastric skin prepped and draped in usual sterile fashion. Following local lidocaine administration, 17 gauge introducer needle was advanced into 1 of the new left liver lesions, and three 18 gauge cores were obtained utilizing continuous ultrasound guidance. Samples were sent to pathology in formalin. Needle removed and hemostasis achieved with 5 minutes of manual compression. Post procedure ultrasound images showed no evidence of significant hemorrhage. IMPRESSION: Ultrasound-guided biopsy of left liver lesion as above. Electronically Signed   By: Miachel Roux M.D.   On: 05/03/2021 11:42   US Paracentesis  Result Date: 05/03/2021 INDICATION: 57 year old woman with recurrent ascites presents to IR for ultrasound-guided liver biopsy. Paracentesis was performed to decrease chance of hemorrhage from liver biopsy. EXAM: ULTRASOUND GUIDED  PARACENTESIS MEDICATIONS: None. COMPLICATIONS: None immediate. PROCEDURE: Informed written consent was obtained from the patient after a discussion of the risks, benefits and alternatives to treatment. A timeout was  performed prior to the initiation of the procedure. Initial ultrasound scanning demonstrates a mild amount of ascites within the right upper quadrant. The right upper abdomen was prepped and draped in the usual sterile fashion. 1% lidocaine was used for local anesthesia. Following this, a 19 gauge, 7-cm, Yueh catheter was introduced utilizing continuous ultrasound guidance. An ultrasound image was saved for documentation purposes. The paracentesis was performed. The catheter was removed and a dressing was applied. The patient tolerated the procedure well without immediate post procedural complication. FINDINGS: A total of approximately 1100 mL of straw-colored fluid was removed. IMPRESSION: Successful ultrasound-guided paracentesis yielding 1.1 liters of peritoneal fluid. Electronically Signed   By: Miachel Roux M.D.   On: 05/03/2021 11:41   DG CHEST PORT 1 VIEW  Result Date: 05/08/2021 CLINICAL DATA:  Acute sepsis.  Pneumonia. EXAM: PORTABLE CHEST 1  VIEW COMPARISON:  05/03/2021 FINDINGS: Heart size remains within normal limits. Low lung volumes are again demonstrated. Atelectasis or infiltrate in the left lung base shows no significant change. Right lung remains clear. IMPRESSION: Low lung volumes, with no significant change in left basilar atelectasis versus infiltrate. Electronically Signed   By: Marlaine Hind M.D.   On: 05/08/2021 08:36   DG CHEST PORT 1 VIEW  Result Date: 05/03/2021 CLINICAL DATA:  Choking EXAM: PORTABLE CHEST 1 VIEW COMPARISON:  05/01/2021 FINDINGS: Single frontal view of the chest demonstrates an unremarkable cardiac silhouette. Linear density at the left lung base consistent with atelectasis. No airspace disease, effusion, or pneumothorax. No acute bony abnormality. IMPRESSION: 1. Stable left basilar atelectasis.  No acute process. Electronically Signed   By: Randa Ngo M.D.   On: 05/03/2021 22:16   DG CHEST PORT 1 VIEW  Result Date: 05/01/2021 CLINICAL DATA:  Altered mental  status. EXAM: PORTABLE CHEST 1 VIEW COMPARISON:  04/23/2021 FINDINGS: Interval removal of the left IJ central venous catheter. The cardiac silhouette, mediastinal and hilar contours are within normal limits given the AP projection and portable technique. Low lung volumes with vascular crowding and bibasilar atelectasis. No definite infiltrates or effusions. The bony thorax is intact. IMPRESSION: Low lung volumes with vascular crowding and bibasilar atelectasis. Electronically Signed   By: Marijo Sanes M.D.   On: 05/01/2021 14:46   DG CHEST PORT 1 VIEW  Result Date: 04/23/2021 CLINICAL DATA:  Central line placement EXAM: PORTABLE CHEST 1 VIEW COMPARISON:  April 22, 2021 FINDINGS: The heart size and mediastinal contours are within normal limits. Left central venous line is identified with distal tip in the superior vena cava. There is no pneumothorax. Mild atelectasis of left lung base is noted. The visualized skeletal structures are unremarkable. IMPRESSION: Left central venous line distal tip in the superior vena cava. No pneumothorax. Electronically Signed   By: Abelardo Diesel M.D.   On: 04/23/2021 12:19   DG Chest Port 1 View  Result Date: 04/22/2021 CLINICAL DATA:  Questionable sepsis - evaluate for abnormality EXAM: PORTABLE CHEST 1 VIEW COMPARISON:  Jul 09, 2020. FINDINGS: Question trace left pleural effusion. Overlying linear left basilar opacity. No visible pneumothorax. Cardiomediastinal silhouette is within normal limits and similar. Cholecystectomy clips. IMPRESSION: Question trace left pleural effusion. Overlying linear opacity most likely represents atelectasis although infection is not excluded. Dedicated PA and lateral radiographs could better characterize if clinically warranted. Electronically Signed   By: Margaretha Sheffield M.D.   On: 04/22/2021 15:58   ECHOCARDIOGRAM COMPLETE  Result Date: 04/23/2021    ECHOCARDIOGRAM REPORT   Patient Name:   Kinzleigh K Menna Date of Exam: 04/23/2021 Medical  Rec #:  [id number]  Height:       60.0 in Accession #:    [id number] Weight:       181.4 lb Date of Birth:  [birth year]  BSA:          1.791 m Patient Age:    65 years   BP:           92/60 mmHg Patient Gender: F          HR:           93 bpm. Exam Location:  Inpatient Procedure: 2D Echo Indications:    abnormal ecg  History:        Patient has prior history of Echocardiogram examinations, most  recent 01/07/2020.  Sonographer:    Johny Chess RDCS Referring Phys: [id number] [place]  1. Left ventricular ejection fraction, by estimation, is 60 to 65%. The left ventricle has normal function. The left ventricle has no regional wall motion abnormalities. Left ventricular diastolic parameters were normal.  2. Right ventricular systolic function is normal. The right ventricular size is normal.  3. The mitral valve is normal in structure. Trivial mitral valve regurgitation.  4. The aortic valve is normal in structure. Aortic valve regurgitation is not visualized. FINDINGS  Left Ventricle: Left ventricular ejection fraction, by estimation, is 60 to 65%. The left ventricle has normal function. The left ventricle has no regional wall motion abnormalities. The left ventricular internal cavity size was normal in size. There is  no left ventricular hypertrophy. Left ventricular diastolic parameters were normal. Right Ventricle: The right ventricular size is normal. Right vetricular wall thickness was not well visualized. Right ventricular systolic function is normal. Left Atrium: Left atrial size was normal in size. Right Atrium: Right atrial size was normal in size. Pericardium: There is no evidence of pericardial effusion. Mitral Valve: The mitral valve is normal in structure. Trivial mitral valve regurgitation. Tricuspid Valve: The tricuspid valve is grossly normal. Tricuspid valve regurgitation is not demonstrated. Aortic Valve: The aortic valve is normal in structure. Aortic valve  regurgitation is not visualized. Pulmonic Valve: The pulmonic valve was normal in structure. Pulmonic valve regurgitation is not visualized. Aorta: The aortic root and ascending aorta are structurally normal, with no evidence of dilitation. IAS/Shunts: The atrial septum is grossly normal.  LEFT VENTRICLE PLAX 2D LVIDd:         4.70 cm   Diastology LVIDs:         2.40 cm   LV e' medial:    11.20 cm/s LV PW:         0.90 cm   LV E/e' medial:  10.4 LV IVS:        0.70 cm   LV e' lateral:   13.90 cm/s LVOT diam:     1.90 cm   LV E/e' lateral: 8.4 LV SV:         77 LV SV Index:   43 LVOT Area:     2.84 cm  RIGHT VENTRICLE             IVC RV S prime:     17.80 cm/s  IVC diam: 1.70 cm TAPSE (M-mode): 4.2 cm LEFT ATRIUM             Index        RIGHT ATRIUM           Index LA diam:        3.70 cm 2.07 cm/m   RA Area:     12.10 cm LA Vol (A2C):   36.9 ml 20.60 ml/m  RA Volume:   27.50 ml  15.36 ml/m LA Vol (A4C):   36.3 ml 20.27 ml/m LA Biplane Vol: 38.1 ml 21.27 ml/m  AORTIC VALVE LVOT Vmax:   155.00 cm/s LVOT Vmean:  105.000 cm/s LVOT VTI:    0.272 m  AORTA Ao Root diam: 2.80 cm Ao Asc diam:  3.00 cm MITRAL VALVE MV Area (PHT): 4.36 cm     SHUNTS MV Decel Time: 174 msec     Systemic VTI:  0.27 m MV E velocity: 117.00 cm/s  Systemic Diam: 1.90 cm MV A velocity: 75.30 cm/s MV E/A ratio:  1.55 Mertie Moores MD  Electronically signed by Mertie Moores MD Signature Date/Time: 04/23/2021/3:37:57 PM    Final    US Abdomen Limited RUQ (LIVER/GB)  Result Date: 04/23/2021 CLINICAL DATA:  Abdominal pain. EXAM: ULTRASOUND ABDOMEN LIMITED RIGHT UPPER QUADRANT COMPARISON:  12/12/2020. FINDINGS: Gallbladder: Surgically absent. Common bile duct: Diameter: 5.5 mm. Liver: No focal lesion identified. The liver has a nodular contour and increased echogenicity is noted. Portal vein is patent on color Doppler imaging with normal direction of blood flow towards the liver. Other: Mild-to-moderate perihepatic ascites. IMPRESSION: 1.  Morphologic changes of cirrhosis. 2. Mild-to-moderate ascites in the right upper quadrant. Electronically Signed   By: Brett Fairy M.D.   On: 04/23/2021 00:29    Microbiology: Results for orders placed or performed during the hospital encounter of 04/22/21  Urine Culture     Status: Abnormal   Collection Time: 04/22/21  2:13 PM   Specimen: Urine, Catheterized  Result Value Ref Range Status   Specimen Description URINE, CATHETERIZED  Final   Special Requests   Final    NONE Performed at [facility], [address] [place], [place] [address]    Culture >=100,000 COLONIES/mL YEAST (A)  Final   Report Status 04/24/2021 FINAL  Final  Resp Panel by RT-PCR (Flu A&B, Covid) Nasopharyngeal Swab     Status: None   Collection Time: 04/22/21  7:36 PM   Specimen: Nasopharyngeal Swab; Nasopharyngeal(NP) swabs in vial transport medium  Result Value Ref Range Status   SARS Coronavirus 2 by RT PCR NEGATIVE NEGATIVE Final    Comment: (NOTE) SARS-CoV-2 target nucleic acids are NOT DETECTED.  The SARS-CoV-2 RNA is generally detectable in upper respiratory specimens during the acute phase of infection. The lowest concentration of SARS-CoV-2 viral copies this assay can detect is 138 copies/mL. A negative result does not preclude SARS-Cov-2 infection and should not be used as the sole basis for treatment or other patient management decisions. A negative result may occur with  improper specimen collection/handling, submission of specimen other than nasopharyngeal swab, presence of viral mutation(s) within the areas targeted by this assay, and inadequate number of viral copies(<138 copies/mL). A negative result must be combined with clinical observations, patient history, and epidemiological information. The expected result is Negative.  Fact Sheet for Patients:  EntrepreneurPulse.com.au  Fact Sheet for Healthcare Providers:  IncredibleEmployment.be  This  test is no t yet approved or cleared by the [place] FDA and  has been authorized for detection and/or diagnosis of SARS-CoV-2 by FDA under an Emergency Use Authorization (EUA). This EUA will remain  in effect (meaning this test can be used) for the duration of the COVID-19 declaration under Section 564(b)(1) of the Act, 21 U.S.C.section 360bbb-3(b)(1), unless the authorization is terminated  or revoked sooner.       Influenza A by PCR NEGATIVE NEGATIVE Final   Influenza B by PCR NEGATIVE NEGATIVE Final    Comment: (NOTE) The Xpert Xpress SARS-CoV-2/FLU/RSV plus assay is intended as an aid in the diagnosis of influenza from Nasopharyngeal swab specimens and should not be used as a sole basis for treatment. Nasal washings and aspirates are unacceptable for Xpert Xpress SARS-CoV-2/FLU/RSV testing.  Fact Sheet for Patients: EntrepreneurPulse.com.au  Fact Sheet for Healthcare Providers: IncredibleEmployment.be  This test is not yet approved or cleared by the [place] FDA and has been authorized for detection and/or diagnosis of SARS-CoV-2 by FDA under an Emergency Use Authorization (EUA). This EUA will remain in effect (meaning this test can be used) for the  duration of the COVID-19 declaration under Section 564(b)(1) of the Act, 21 U.S.C. section 360bbb-3(b)(1), unless the authorization is terminated or revoked.  Performed at [facility], [address] [place], [place] [address]   MRSA Next Gen by PCR, Nasal     Status: None   Collection Time: 04/22/21  9:38 PM   Specimen: Nasal Mucosa; Nasal Swab  Result Value Ref Range Status   MRSA by PCR Next Gen NOT DETECTED NOT DETECTED Final    Comment: (NOTE) The GeneXpert MRSA Assay (FDA approved for NASAL specimens only), is one component of a comprehensive MRSA colonization surveillance program. It is not intended to diagnose MRSA infection nor to guide or monitor treatment for MRSA  infections. Test performance is not FDA approved in patients less than 22 years old. Performed at [facility], [place] [address] [place], [place] [address]   Culture, blood (routine x 2)     Status: None   Collection Time: 04/23/21 12:11 PM   Specimen: BLOOD  Result Value Ref Range Status   Specimen Description BLOOD SITE NOT SPECIFIED  Final   Special Requests   Final    BOTTLES DRAWN AEROBIC AND ANAEROBIC Blood Culture adequate volume   Culture   Final    NO GROWTH 5 DAYS Performed at [facility], [place] [address] [place], [place] [address]    Report Status 04/28/2021 FINAL  Final  Culture, blood (routine x 2)     Status: None   Collection Time: 04/23/21 12:35 PM   Specimen: BLOOD  Result Value Ref Range Status   Specimen Description BLOOD SITE NOT SPECIFIED  Final   Special Requests   Final    BOTTLES DRAWN AEROBIC AND ANAEROBIC Blood Culture adequate volume   Culture   Final    NO GROWTH 5 DAYS Performed at [facility], [place] [address] [place], [place] [address]    Report Status 04/28/2021 FINAL  Final  Peritoneal fluid culture w Gram Stain     Status: None   Collection Time: 04/23/21  4:49 PM   Specimen: Peritoneal Washings; Peritoneal Fluid  Result Value Ref Range Status   Specimen Description PERITONEAL  Final   Special Requests NONE  Final   Gram Stain   Final    FEW WBC PRESENT, PREDOMINANTLY MONONUCLEAR NO ORGANISMS SEEN    Culture   Final    NO GROWTH 3 DAYS Performed at [facility], [address], [place], [place] [address]    Report Status 04/26/2021 FINAL  Final  Culture, blood (routine x 2)     Status: None (Preliminary result)   Collection Time: 05/08/21  8:03 AM   Specimen: BLOOD RIGHT HAND  Result Value Ref Range Status   Specimen Description BLOOD RIGHT HAND  Final   Special Requests   Final    BOTTLES DRAWN AEROBIC ONLY Blood Culture results may not be optimal due to an inadequate volume of blood received in culture  bottles   Culture   Final    NO GROWTH 1 DAY Performed at [facility], [place] [address], [place], [place] [address]    Report Status PENDING  Incomplete  Culture, blood (routine x 2)     Status: None (Preliminary result)   Collection Time: 05/08/21  8:10 AM   Specimen: BLOOD RIGHT HAND  Result Value Ref Range Status   Specimen Description BLOOD RIGHT HAND  Final   Special Requests   Final  BOTTLES DRAWN AEROBIC AND ANAEROBIC Blood Culture adequate volume   Culture   Final    NO GROWTH 1 DAY Performed at [facility], [place] [address] [place], [place] [address]    Report Status PENDING  Incomplete  Urine Culture     Status: Abnormal (Preliminary result)   Collection Time: 05/08/21  2:19 PM   Specimen: Urine, Clean Catch  Result Value Ref Range Status   Specimen Description URINE, CLEAN CATCH  Final   Special Requests NONE  Final   Culture (A)  Final    10,000 COLONIES/mL ENTEROCOCCUS FAECIUM SUSCEPTIBILITIES TO FOLLOW Performed at [facility], [place] [address] [place], [place] [address]    Report Status PENDING  Incomplete    Labs: CBC: Recent Labs  Lab 05/04/21 0228 05/08/21 0139 05/09/21 0402  WBC 20.0* 24.2* 23.2*  NEUTROABS 13.2* 19.6* 18.6*  HGB 8.0* 8.4* 7.8*  HCT 24.1* 25.9* 23.9*  MCV 83.4 87.2 88.2  PLT 190 331 239   Basic Metabolic Panel: Recent Labs  Lab 05/04/21 0228 05/05/21 0034 05/08/21 0139 05/09/21 0402 05/10/21 0302  NA 127* 128* 123* 122* 125*  K 5.1 5.0 4.5 4.2 4.5  CL 101 102 97* 99 103  CO2 19* 19* 16* 15* 15*  GLUCOSE 106* 94 102* 90 91  BUN 17 17 19 20 20   CREATININE 0.89 0.85 1.19* 1.25* 1.24*  CALCIUM 7.7* 7.8* 7.8* 7.4* 7.4*  MG 2.4  --  2.4 2.4  --   PHOS 2.5  --  3.2  --   --    Liver Function Tests: Recent Labs  Lab 05/04/21 0228 05/05/21 0034 05/08/21 0139 05/09/21 0402 05/10/21 0302  AST 115* 115* 118* 112* 120*  ALT 42 40 50* 43 45*  ALKPHOS 262* 272* 272* 246* 260*  BILITOT 4.9* 5.1* 4.4*  4.3* 4.8*  PROT 4.9* 5.0* 5.2* 5.0* 4.6*  ALBUMIN 2.2* 2.2* 2.1* 2.4* 2.0*   CBG: No results for input(s): GLUCAP in the last 168 hours.  Discharge time spent: 48 minutes  Signed: Hosie Poisson, MD [facility] 05/10/2021

## 2021-05-11 LAB — URINE CULTURE: Culture: 10000 — AB

## 2021-05-13 LAB — CULTURE, BLOOD (ROUTINE X 2)
Culture: NO GROWTH
Culture: NO GROWTH
Special Requests: ADEQUATE

## 2021-05-22 ENCOUNTER — Telehealth: Payer: Self-pay | Admitting: Internal Medicine

## 2021-05-22 NOTE — Telephone Encounter (Signed)
noted 

## 2021-05-22 NOTE — Telephone Encounter (Signed)
Sharon Lawrence with [name] called requesting provider sign death certificate. ? ?Patient passed on Jun 01, 2022. ? ?Info can be found on [facility] w[facility]ite. ? ?Call back info  ?[phone] ?

## 2021-06-02 DEATH — deceased

## 2021-07-11 ENCOUNTER — Ambulatory Visit: Payer: Medicaid Other | Admitting: Internal Medicine

## 2021-10-24 ENCOUNTER — Ambulatory Visit: Payer: Medicaid Other | Admitting: Cardiology

## 2022-01-11 ENCOUNTER — Other Ambulatory Visit: Payer: Self-pay

## 2022-04-21 IMAGING — CT CT HEAD W/O CM
4 series · 17 of 47 positions shown, 19 images · non-contrast
Comparison: None.

CLINICAL DATA: Altered mental status.



[Series 3: head without · axial · non-contrast · 0.39mm/px · z∈[-117,+3]mm · 7 of 32 slices shown, 9 images]
[im 4/32  brain]
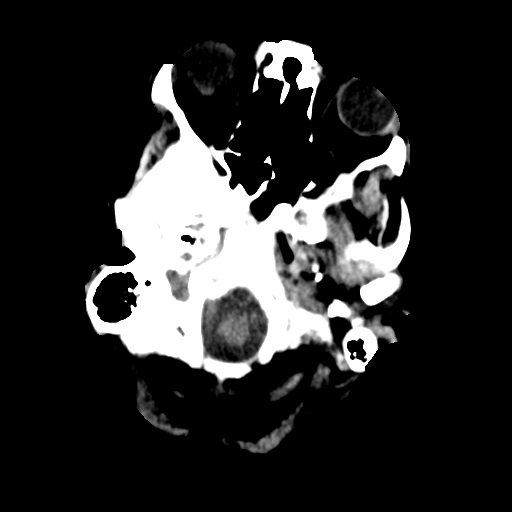
[im 4/32  bone]
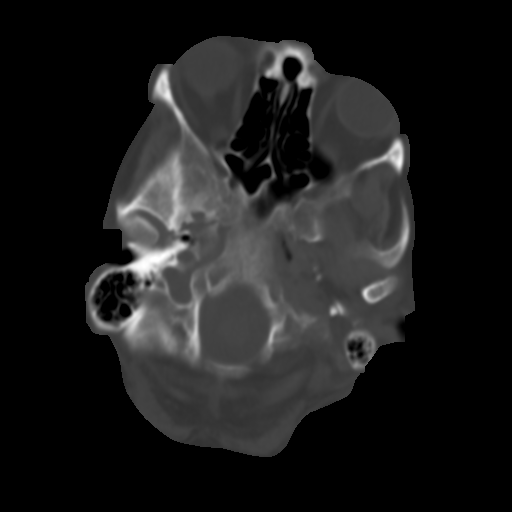
[im 8/32  brain]
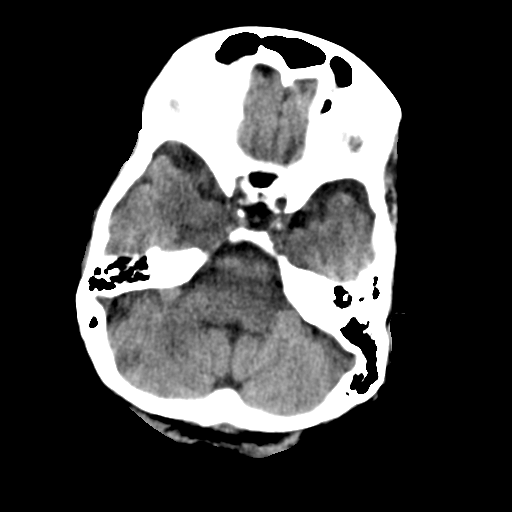
[im 12/32  brain]
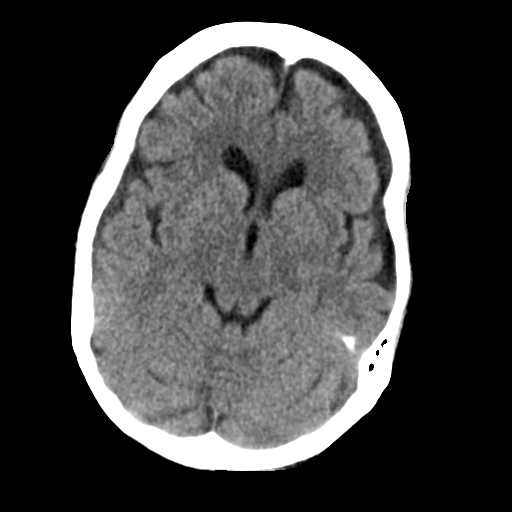
[im 16/32  brain]
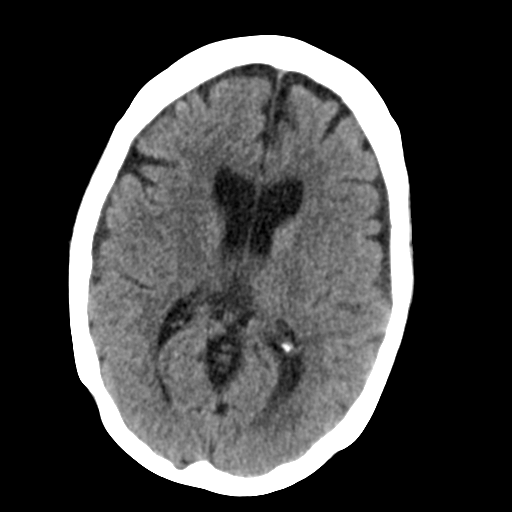
[im 20/32  brain]
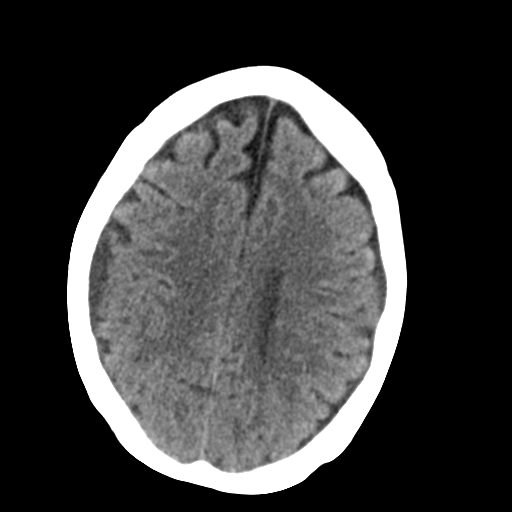
[im 20/32  bone]
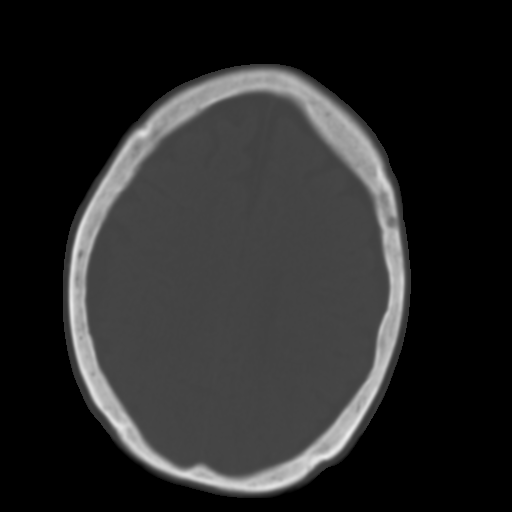
[im 24/32  brain]
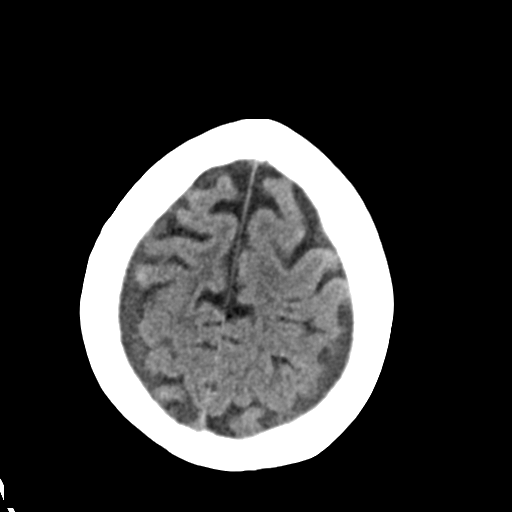
[im 28/32  brain]
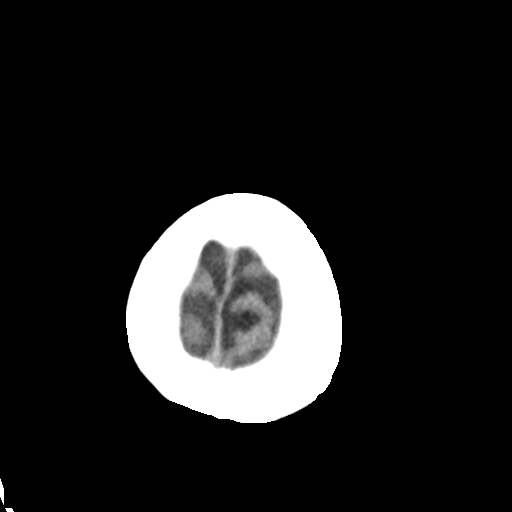

[Series 4: head bone · axial · 0.39mm/px · z∈[-118,-62]mm · 4 of 79 slices shown]
[im 8/79  bone]
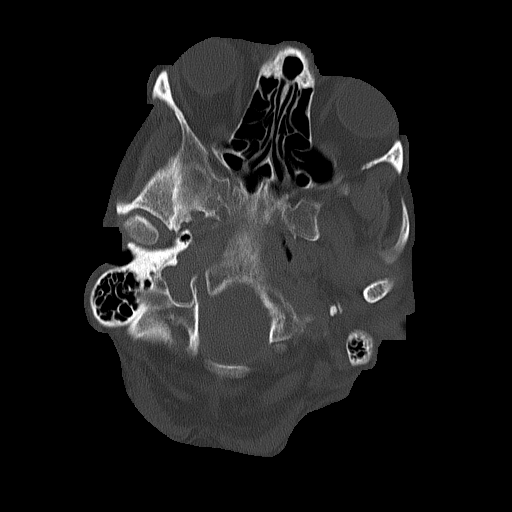
[im 16/79  bone]
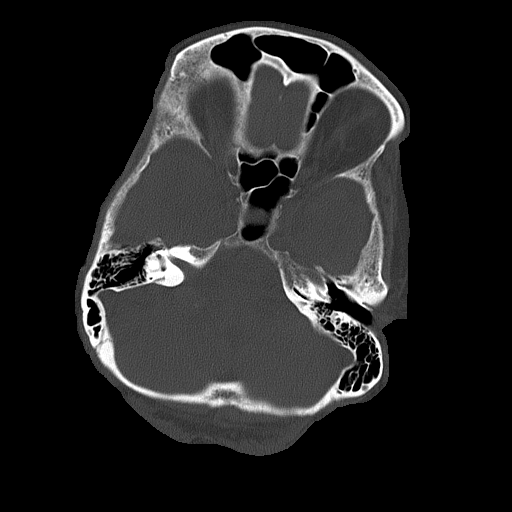
[im 24/79  bone]
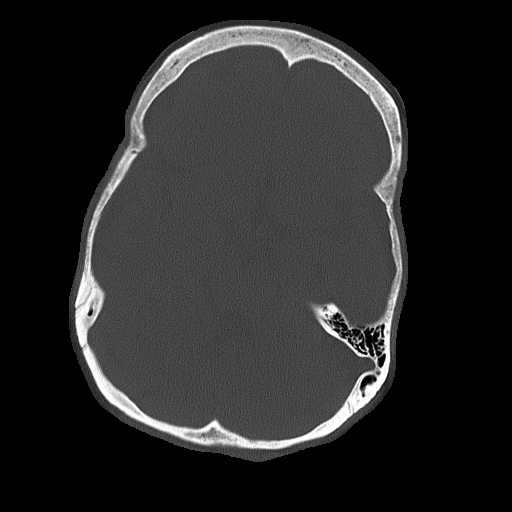
[im 36/79  bone]
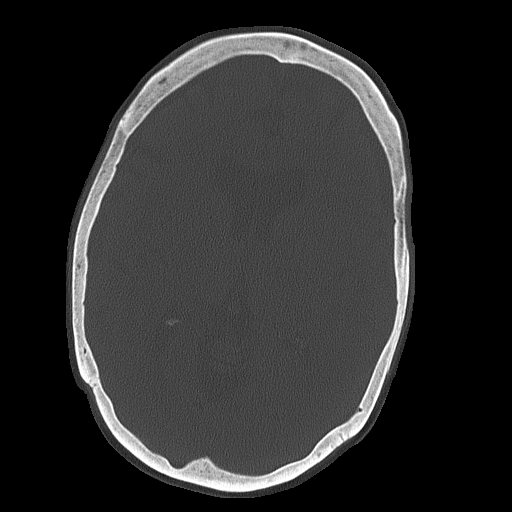

[Series 5: head without cor · coronal · non-contrast · 0.31mm/px · 3 of 67 slices shown]
[im 23/67  brain]
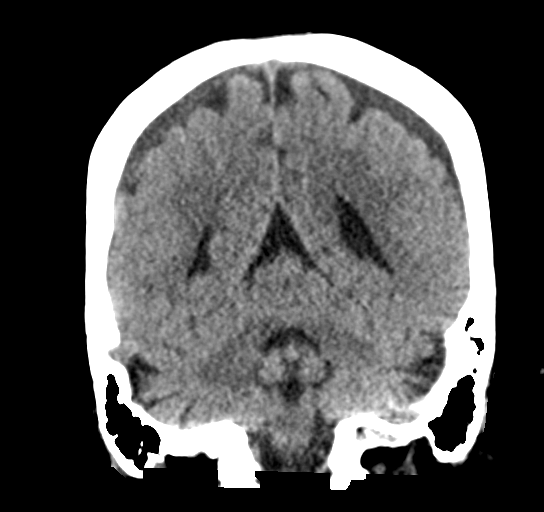
[im 30/67  brain]
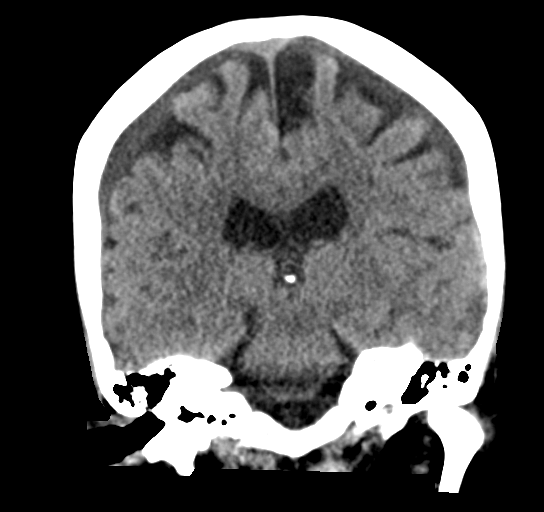
[im 37/67  brain]
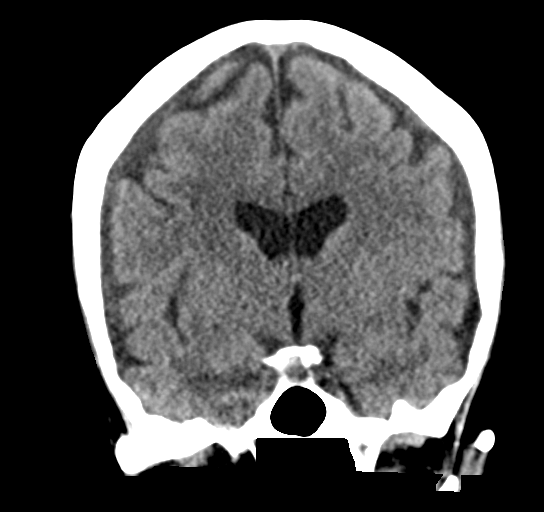

[Series 6: head without sag · sagittal · non-contrast · 0.31mm/px · 3 of 57 slices shown]
[im 19/57  brain]
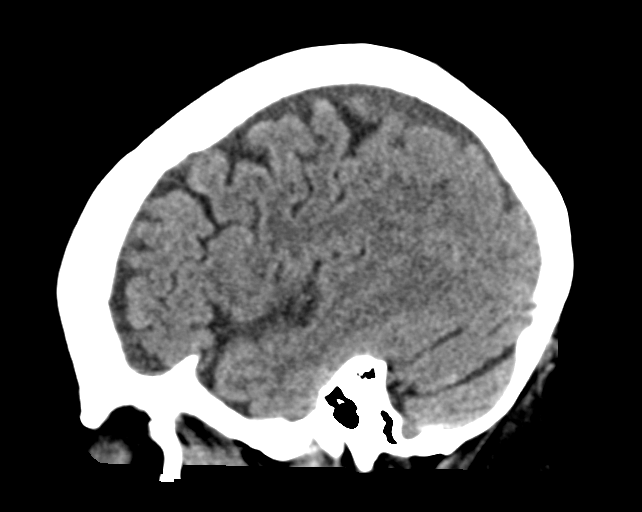
[im 29/57  brain]
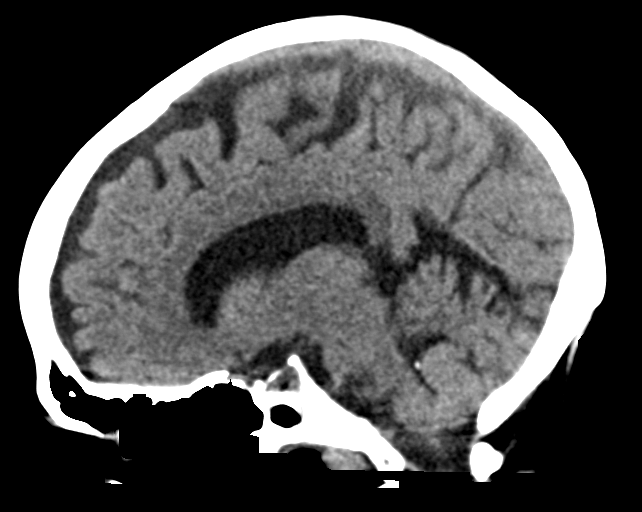
[im 38/57  brain]
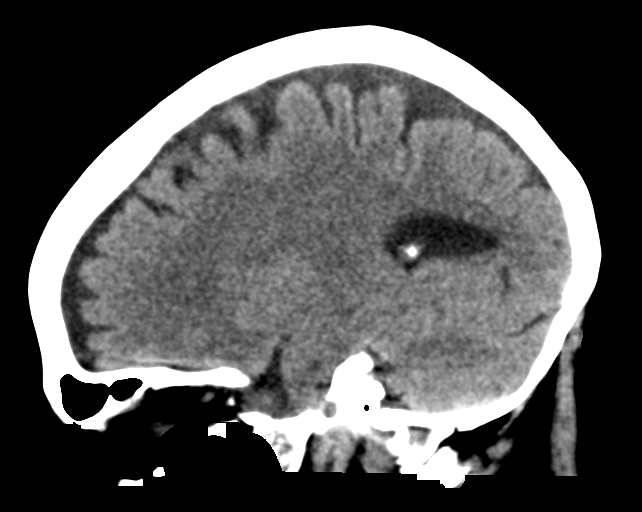

[17 of 47 positions shown; findings below may reference images not displayed]

FINDINGS: Brain: Mild diffuse cortical atrophy is noted. No evidence of acute
infarction, hemorrhage, hydrocephalus, extra-axial collection or
mass lesion/mass effect.

Vascular: No hyperdense vessel or unexpected calcification.

Skull: Normal. Negative for fracture or focal lesion.

Sinuses/Orbits: No acute finding.

Other: None.
IMPRESSION: Mild diffuse cortical atrophy. No acute intracranial abnormality
seen.

## 2022-04-23 IMAGING — US US BIOPSY CORE LIVER
1 series · 10 of 10 positions shown · non-contrast
Comparison: none

INDICATION: 55-year-old woman with history of cirrhosis and liver lesions
presents to IR for left liver mass biopsy

[Series 1: us biopsy core liver · 0.17mm/px · 10 of 10 slices shown]
[im 1/10]
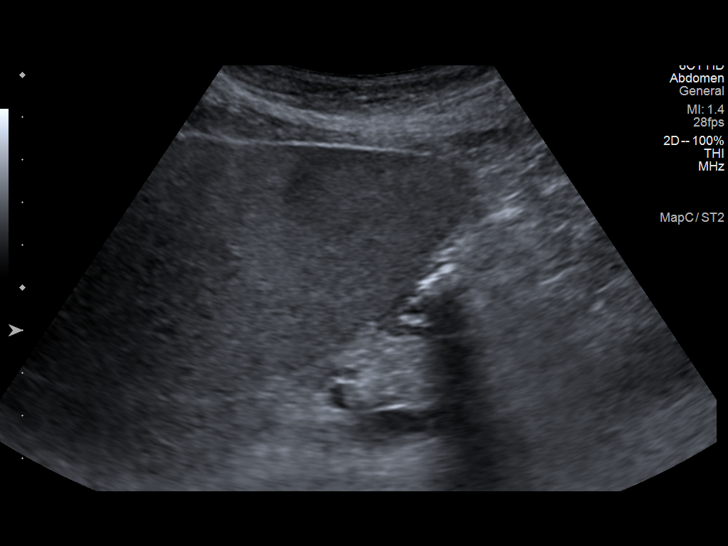
[im 2/10]
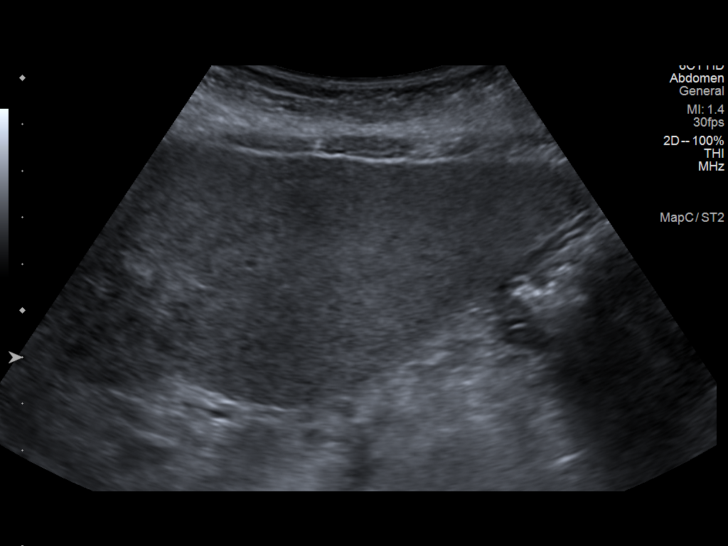
[im 3/10]
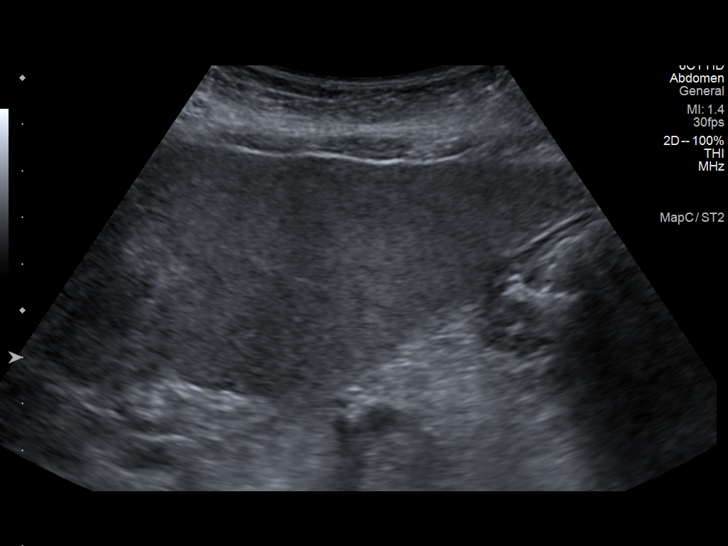
[im 4/10]
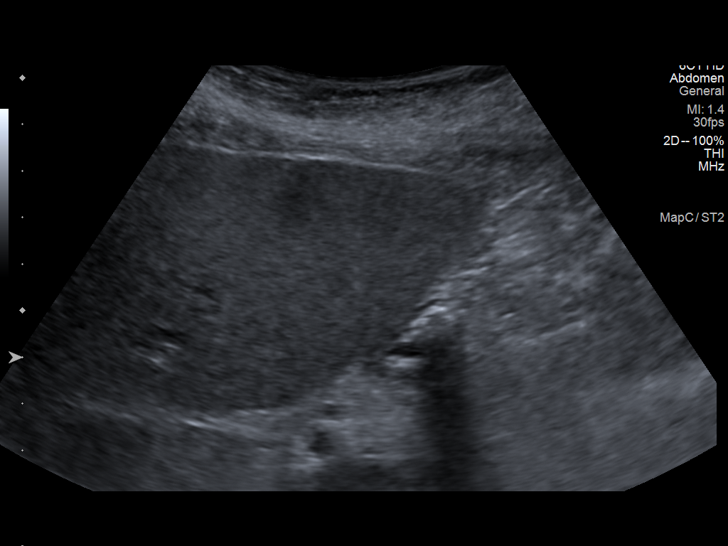
[im 5/10]
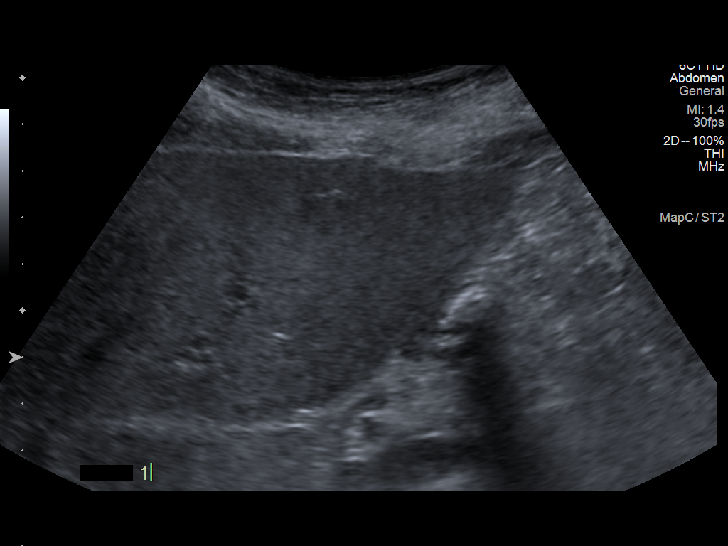
[im 6/10]
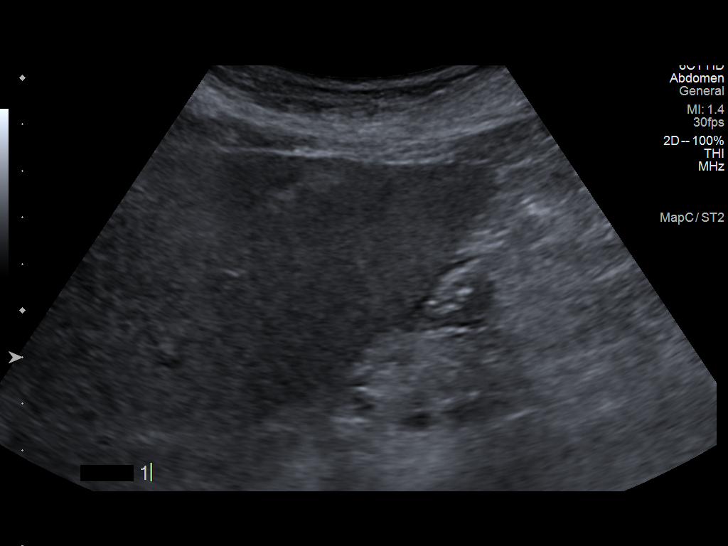
[im 7/10]
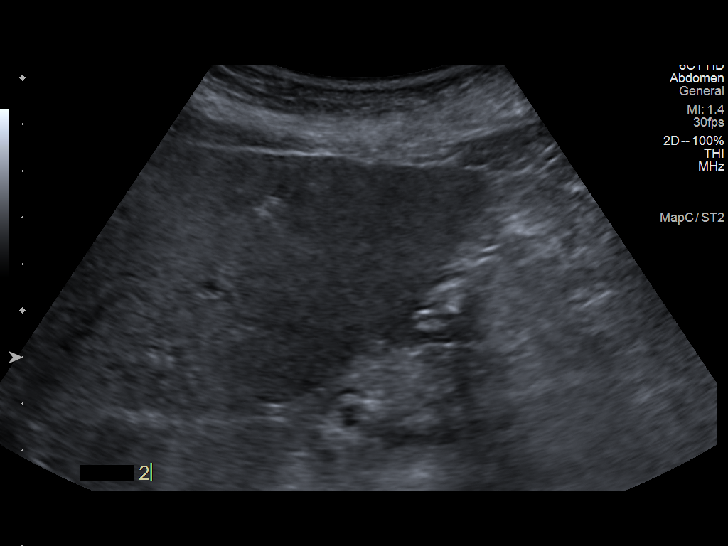
[im 8/10]
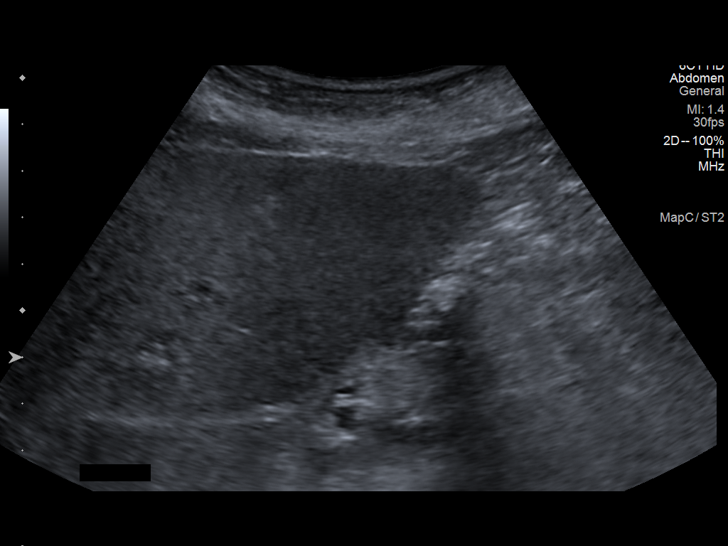
[im 9/10]
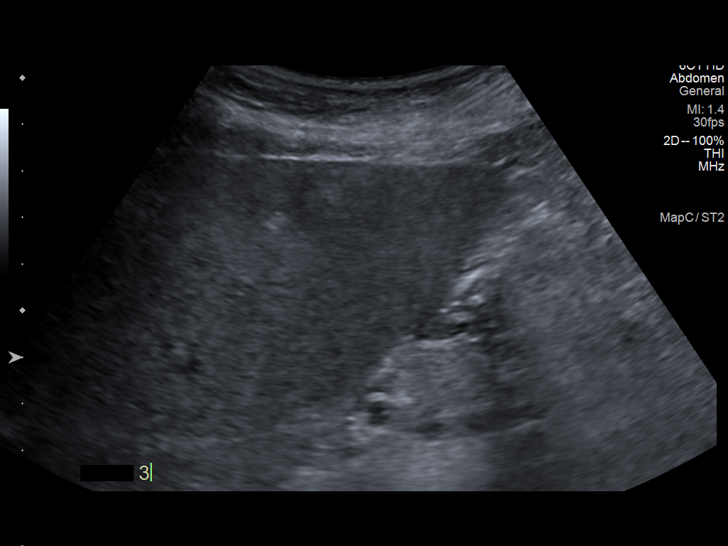
[im 10/10]
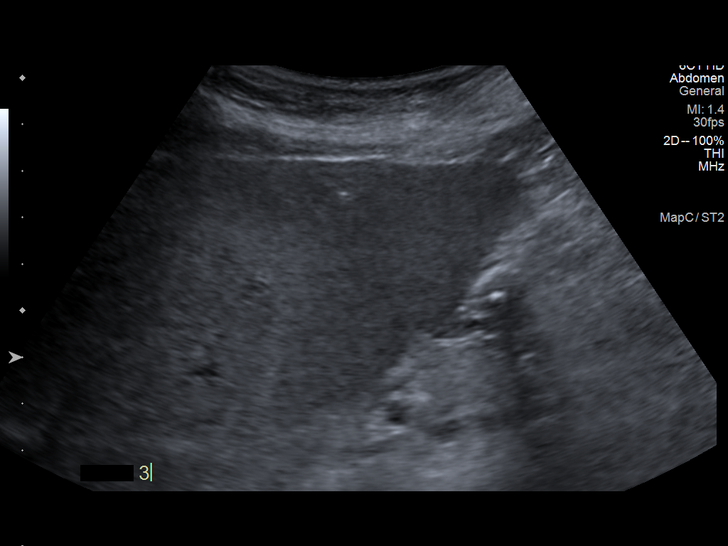

[10 of 10 positions shown; findings below may reference images not displayed]

EXAM:
Ultrasound-guided biopsy of left liver lesion

MEDICATIONS:
None.

ANESTHESIA/SEDATION:
Moderate (conscious) sedation was employed during this procedure. A
total of Versed 0.5 mg and Fentanyl 25 mcg was administered
intravenously by the radiology nurse.

Total intra-service moderate Sedation Time: 20 minutes. The
patient's level of consciousness and vital signs were monitored
continuously by radiology nursing throughout the procedure under my
direct supervision.

COMPLICATIONS:
None immediate.

PROCEDURE:
Informed written consent was obtained from the patient after a
thorough discussion of the procedural risks, benefits and
alternatives. All questions were addressed. Maximal Sterile Barrier
Technique was utilized including caps, mask, sterile gowns, sterile
gloves, sterile drape, hand hygiene and skin antiseptic. A timeout
was performed prior to the initiation of the procedure.

Patient position supine on the ultrasound table.

Epigastric skin prepped and draped in usual sterile fashion.

Following local lidocaine administration, 17 gauge introducer needle
was advanced into 1 of the new left liver lesions, and three 18
gauge cores were obtained utilizing continuous ultrasound guidance.

Samples were sent to pathology in formalin.

Needle removed and hemostasis achieved with 5 minutes of manual
compression.

Post procedure ultrasound images showed no evidence of significant
hemorrhage.
IMPRESSION: Ultrasound-guided biopsy of left liver lesion as above.

## 2022-04-23 IMAGING — DX DG CHEST 1V PORT
1 series · 1 of 1 positions shown · non-contrast
Comparison: 05/01/2021

CLINICAL DATA: Choking

EXAM:
PORTABLE CHEST 1 VIEW

[chest ap]
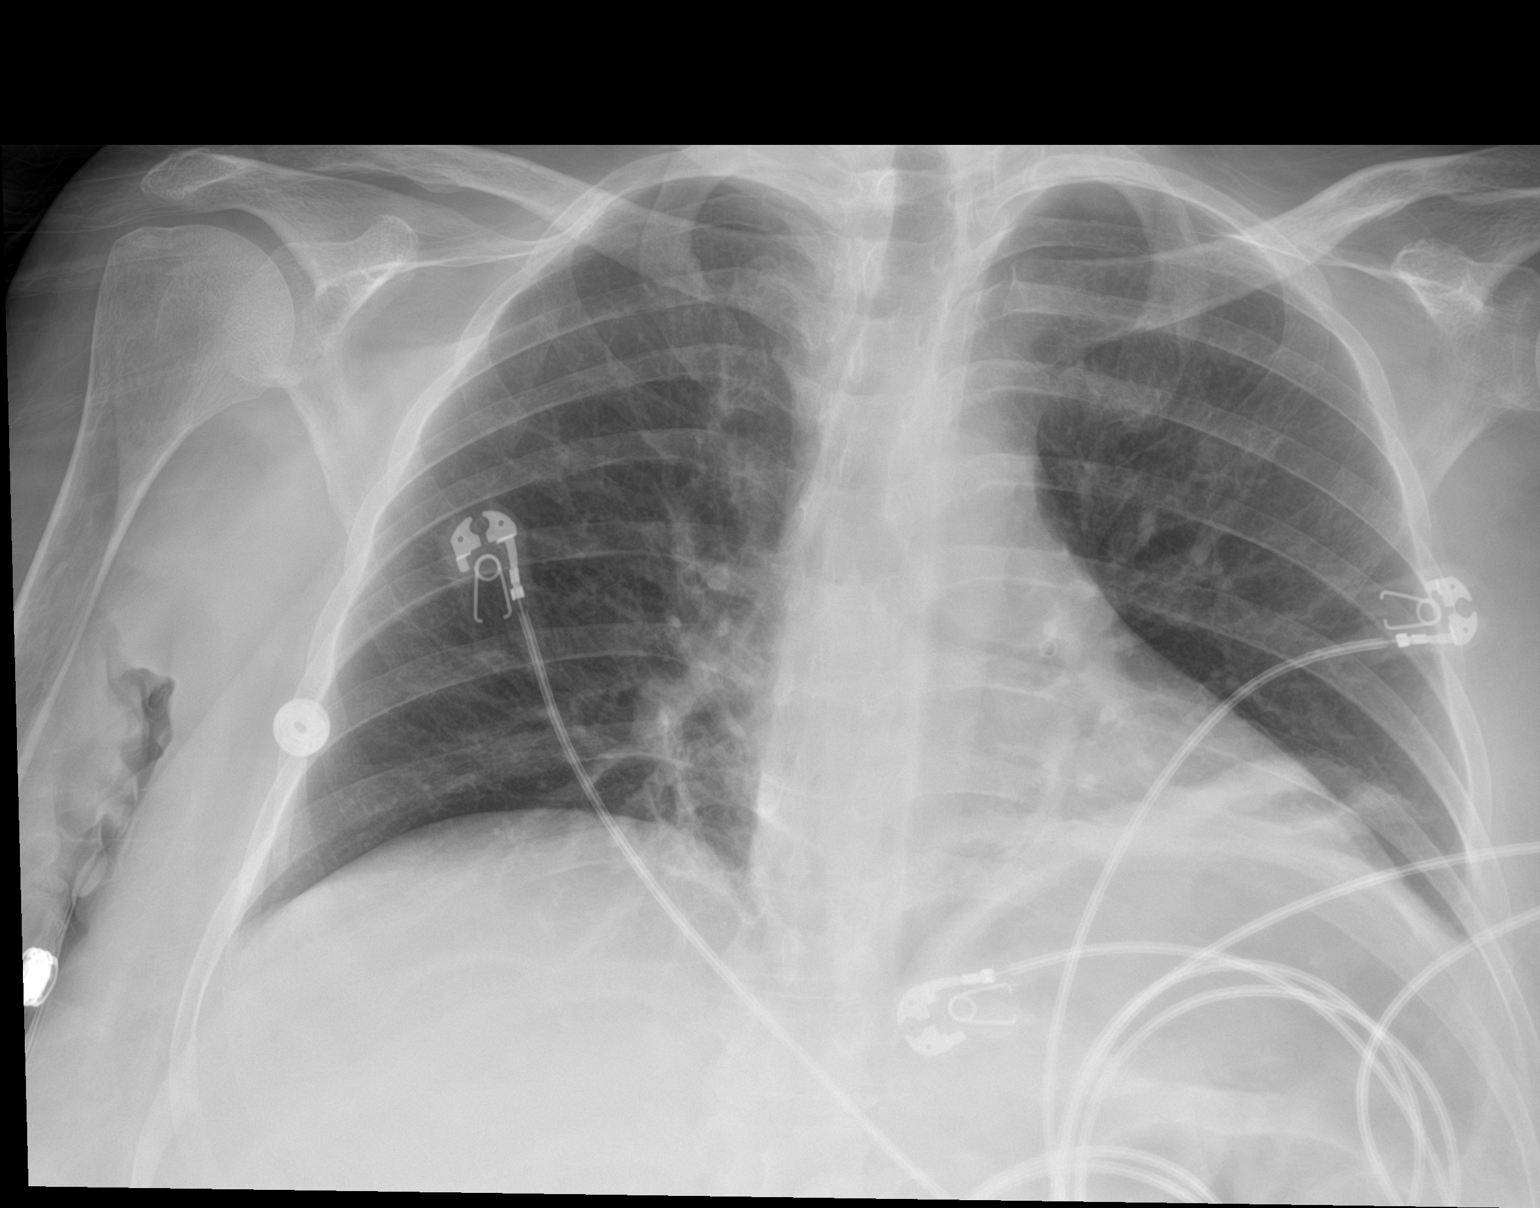

[1 of 1 positions shown; findings below may reference images not displayed]

FINDINGS: Single frontal view of the chest demonstrates an unremarkable
cardiac silhouette. Linear density at the left lung base consistent
with atelectasis. No airspace disease, effusion, or pneumothorax. No
acute bony abnormality.
IMPRESSION: 1. Stable left basilar atelectasis.  No acute process.

## 2022-04-23 IMAGING — US US PARACENTESIS
1 series · 5 of 5 positions shown · non-contrast
Comparison: none

INDICATION: 55-year-old woman with recurrent ascites presents to IR for
ultrasound-guided liver biopsy. Paracentesis was performed to
decrease chance of hemorrhage from liver biopsy.

[Series 1: us paracentesis · 0.24mm/px · 5 of 5 slices shown]
[im 1/5]
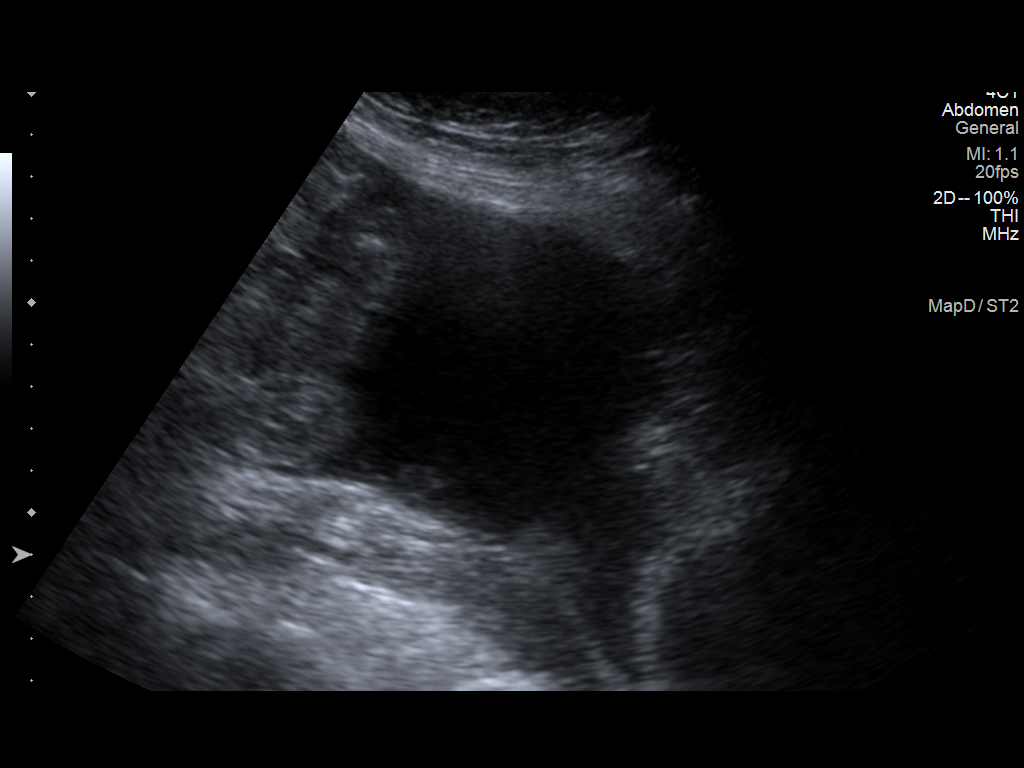
[im 2/5]
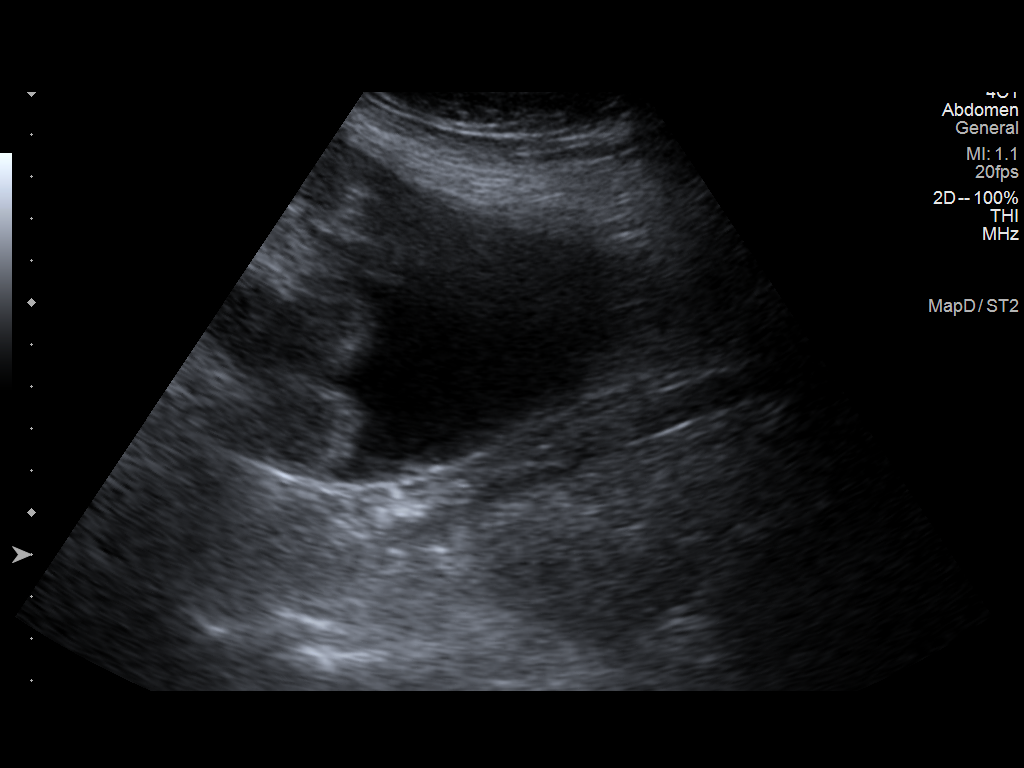
[im 3/5]
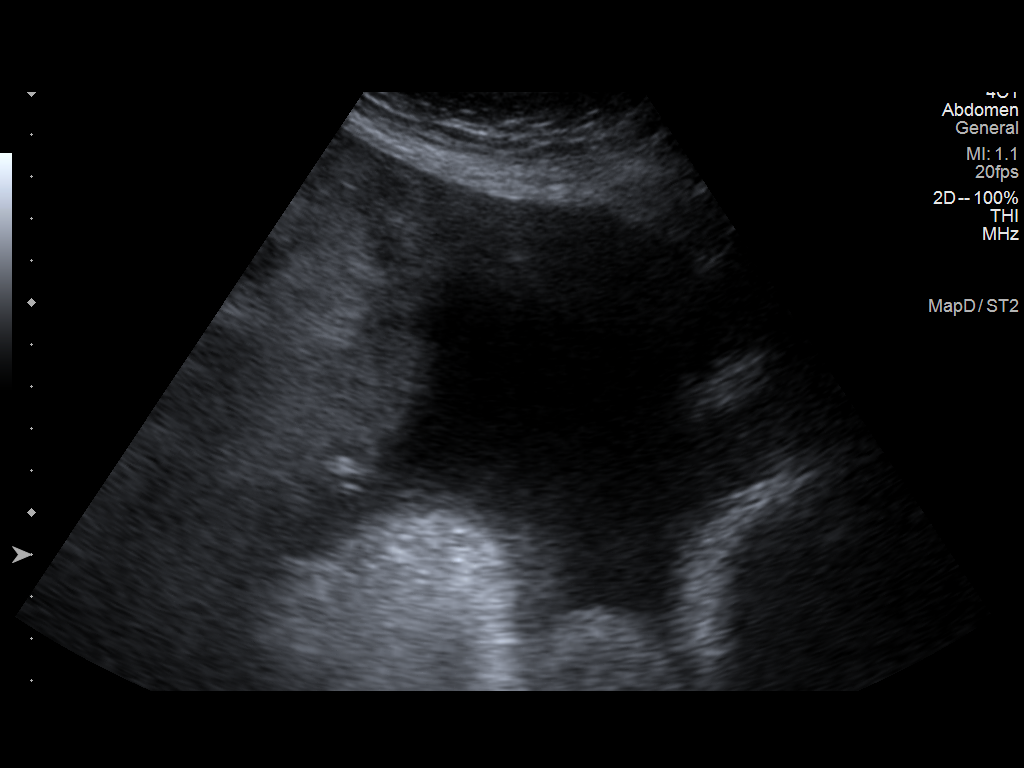
[im 4/5]
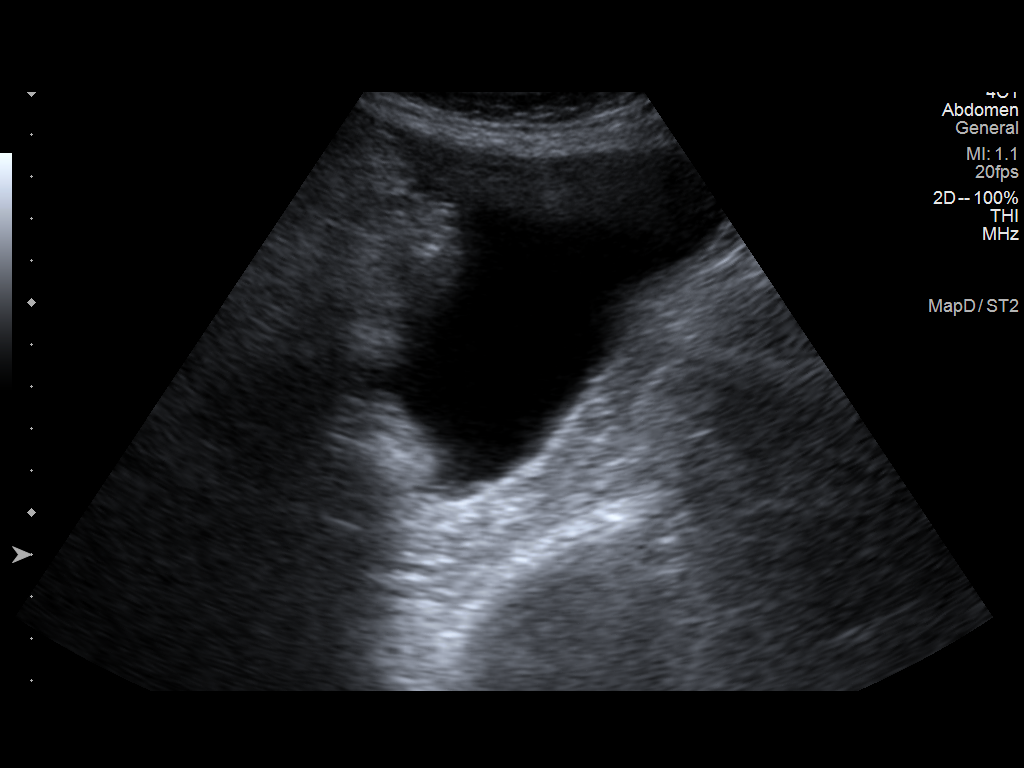
[im 5/5]
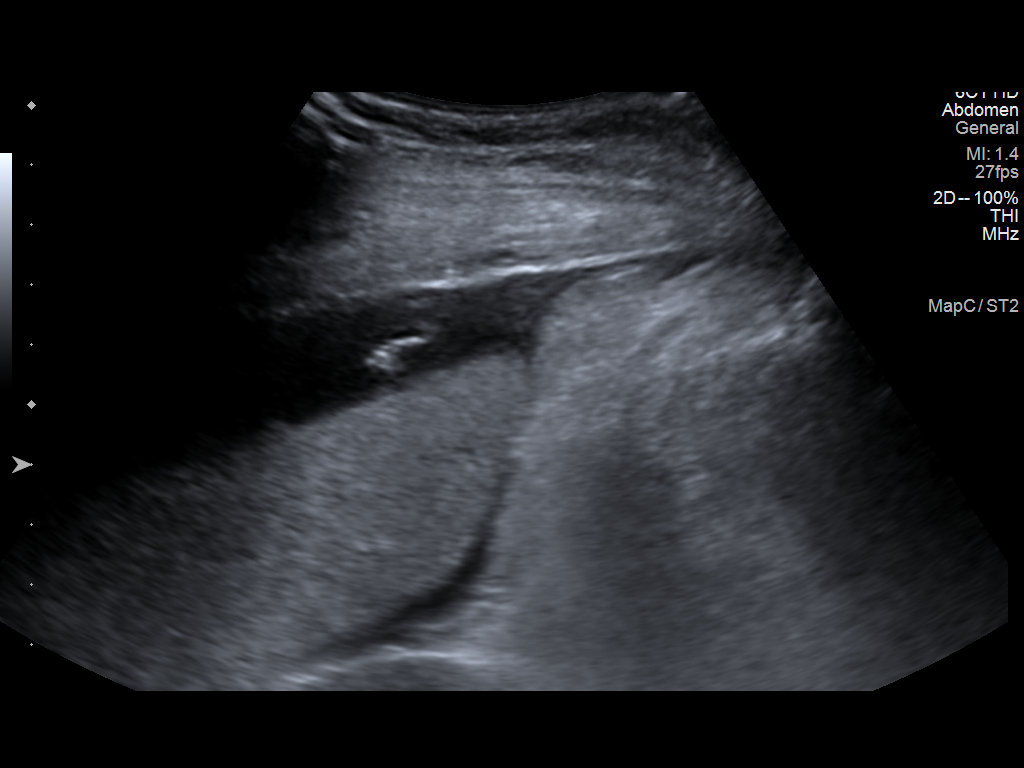

[5 of 5 positions shown; findings below may reference images not displayed]

EXAM:
ULTRASOUND GUIDED  PARACENTESIS

MEDICATIONS:
None.

COMPLICATIONS:
None immediate.

PROCEDURE:
Informed written consent was obtained from the patient after a
discussion of the risks, benefits and alternatives to treatment. A
timeout was performed prior to the initiation of the procedure.

Initial ultrasound scanning demonstrates a mild amount of ascites
within the right upper quadrant. The right upper abdomen was prepped
and draped in the usual sterile fashion. 1% lidocaine was used for
local anesthesia.

Following this, a 19 gauge, 7-cm, Yueh catheter was introduced
utilizing continuous ultrasound guidance. An ultrasound image was
saved for documentation purposes. The paracentesis was performed.
The catheter was removed and a dressing was applied. The patient
tolerated the procedure well without immediate post procedural
complication.
FINDINGS: A total of approximately 4466 mL of straw-colored fluid was removed.
IMPRESSION: Successful ultrasound-guided paracentesis yielding 1.1 liters of
peritoneal fluid.

## 2022-04-28 IMAGING — DX DG CHEST 1V PORT
1 series · 1 of 1 positions shown · non-contrast
Comparison: 05/03/2021

CLINICAL DATA: Acute sepsis.  Pneumonia.

EXAM:
PORTABLE CHEST 1 VIEW

[chest]
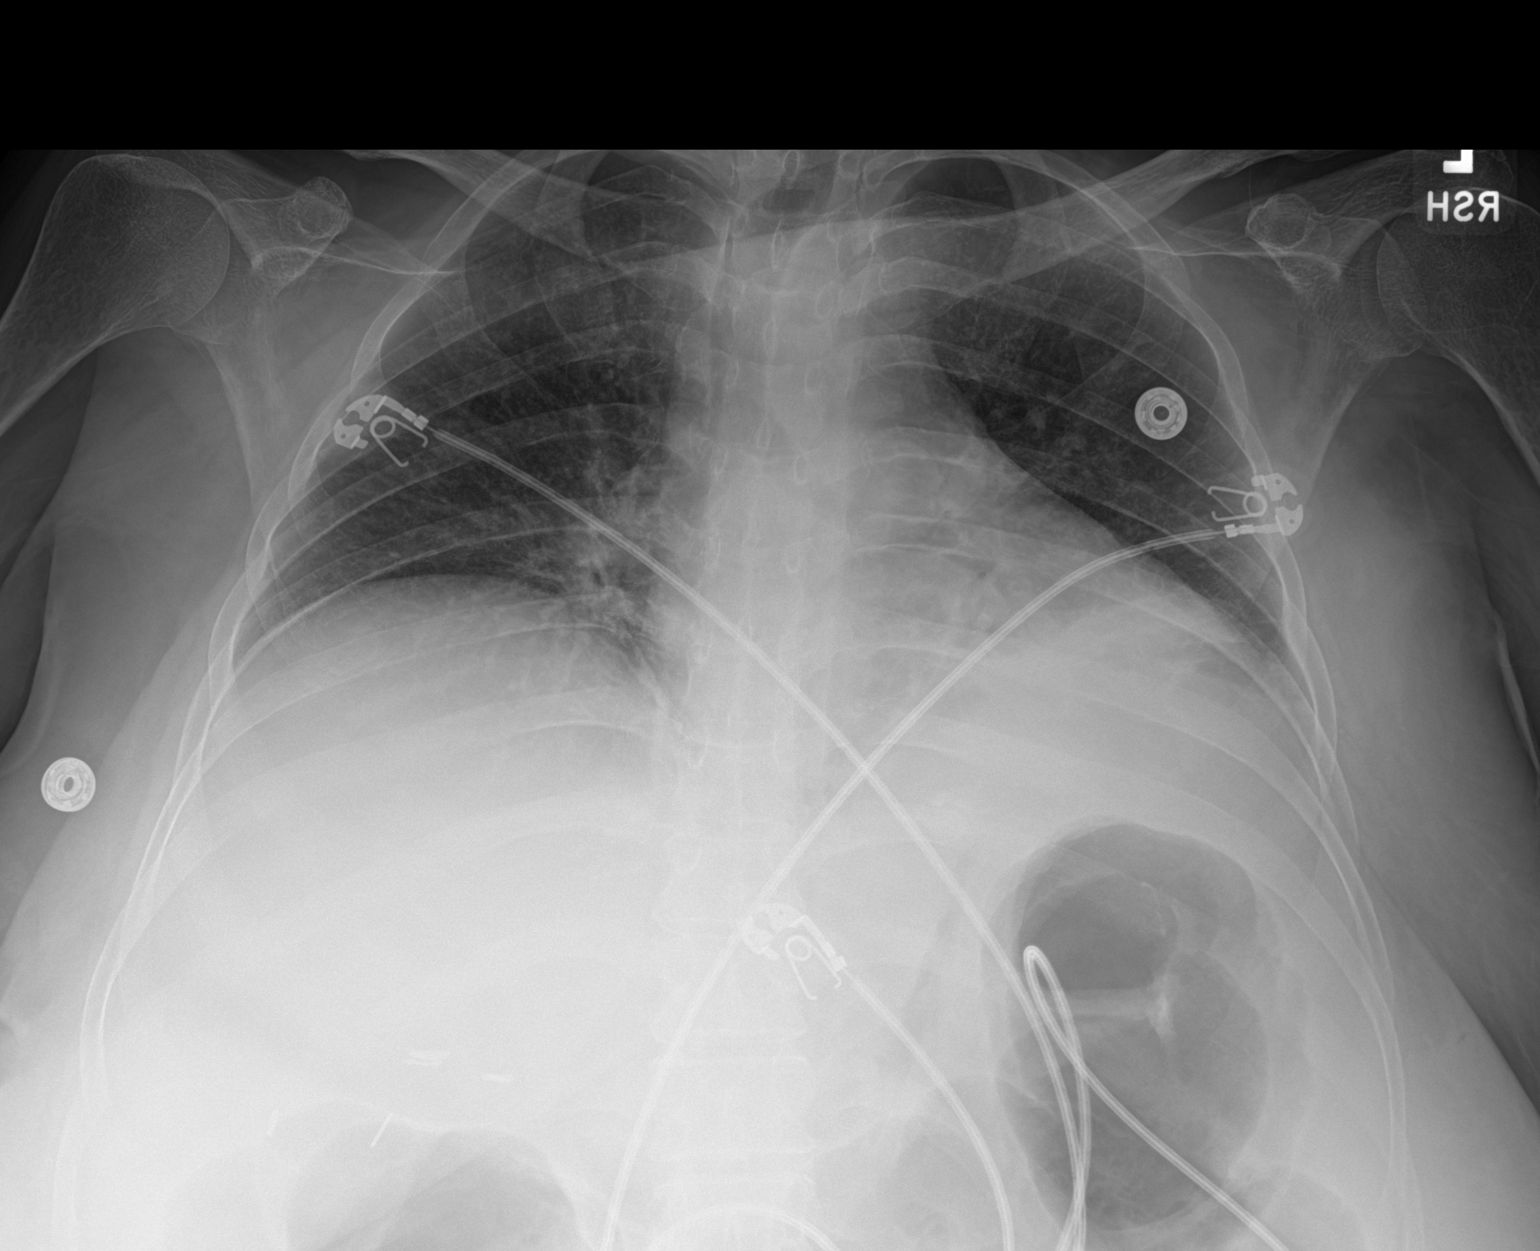

[1 of 1 positions shown; findings below may reference images not displayed]

FINDINGS: Heart size remains within normal limits. Low lung volumes are again
demonstrated. Atelectasis or infiltrate in the left lung base shows
no significant change. Right lung remains clear.
IMPRESSION: Low lung volumes, with no significant change in left basilar
atelectasis versus infiltrate.

## 2022-10-16 ENCOUNTER — Other Ambulatory Visit (HOSPITAL_COMMUNITY): Payer: Self-pay
